# Patient Record
Sex: Male | Born: 1945 | Race: White | Hispanic: No | Marital: Married | Smoking: Former smoker
Health system: Southern US, Community
[De-identification: ages and names within clinical notes are randomized; demographics above are authoritative.]

## PROBLEM LIST (undated history)

## (undated) DIAGNOSIS — G4730 Sleep apnea, unspecified: Secondary | ICD-10-CM

## (undated) DIAGNOSIS — R0609 Other forms of dyspnea: Secondary | ICD-10-CM

## (undated) DIAGNOSIS — G4733 Obstructive sleep apnea (adult) (pediatric): Secondary | ICD-10-CM

## (undated) DIAGNOSIS — R7401 Elevation of levels of liver transaminase levels: Secondary | ICD-10-CM

## (undated) DIAGNOSIS — M1990 Unspecified osteoarthritis, unspecified site: Secondary | ICD-10-CM

## (undated) DIAGNOSIS — J189 Pneumonia, unspecified organism: Secondary | ICD-10-CM

## (undated) DIAGNOSIS — K219 Gastro-esophageal reflux disease without esophagitis: Secondary | ICD-10-CM

## (undated) DIAGNOSIS — E669 Obesity, unspecified: Secondary | ICD-10-CM

## (undated) DIAGNOSIS — I2510 Atherosclerotic heart disease of native coronary artery without angina pectoris: Secondary | ICD-10-CM

## (undated) DIAGNOSIS — E119 Type 2 diabetes mellitus without complications: Secondary | ICD-10-CM

## (undated) DIAGNOSIS — K5120 Ulcerative (chronic) proctitis without complications: Secondary | ICD-10-CM

## (undated) DIAGNOSIS — I82409 Acute embolism and thrombosis of unspecified deep veins of unspecified lower extremity: Secondary | ICD-10-CM

## (undated) DIAGNOSIS — Z8719 Personal history of other diseases of the digestive system: Secondary | ICD-10-CM

## (undated) DIAGNOSIS — T4145XA Adverse effect of unspecified anesthetic, initial encounter: Secondary | ICD-10-CM

## (undated) DIAGNOSIS — M4800 Spinal stenosis, site unspecified: Secondary | ICD-10-CM

## (undated) DIAGNOSIS — T8859XA Other complications of anesthesia, initial encounter: Secondary | ICD-10-CM

## (undated) DIAGNOSIS — R740 Nonspecific elevation of levels of transaminase and lactic acid dehydrogenase [LDH]: Secondary | ICD-10-CM

## (undated) DIAGNOSIS — D539 Nutritional anemia, unspecified: Secondary | ICD-10-CM

## (undated) DIAGNOSIS — K5790 Diverticulosis of intestine, part unspecified, without perforation or abscess without bleeding: Secondary | ICD-10-CM

## (undated) DIAGNOSIS — J449 Chronic obstructive pulmonary disease, unspecified: Secondary | ICD-10-CM

## (undated) DIAGNOSIS — Z9289 Personal history of other medical treatment: Secondary | ICD-10-CM

## (undated) DIAGNOSIS — I10 Essential (primary) hypertension: Secondary | ICD-10-CM

## (undated) DIAGNOSIS — I480 Paroxysmal atrial fibrillation: Secondary | ICD-10-CM

## (undated) DIAGNOSIS — R0600 Dyspnea, unspecified: Secondary | ICD-10-CM

## (undated) DIAGNOSIS — N400 Enlarged prostate without lower urinary tract symptoms: Secondary | ICD-10-CM

## (undated) DIAGNOSIS — I2699 Other pulmonary embolism without acute cor pulmonale: Secondary | ICD-10-CM

## (undated) DIAGNOSIS — Z8739 Personal history of other diseases of the musculoskeletal system and connective tissue: Secondary | ICD-10-CM

## (undated) HISTORY — DX: Essential (primary) hypertension: I10

## (undated) HISTORY — DX: Diverticulosis of intestine, part unspecified, without perforation or abscess without bleeding: K57.90

## (undated) HISTORY — DX: Benign prostatic hyperplasia without lower urinary tract symptoms: N40.0

## (undated) HISTORY — DX: Gastro-esophageal reflux disease without esophagitis: K21.9

## (undated) HISTORY — DX: Obstructive sleep apnea (adult) (pediatric): G47.33

## (undated) HISTORY — DX: Type 2 diabetes mellitus without complications: E11.9

## (undated) HISTORY — DX: Personal history of other medical treatment: Z92.89

## (undated) HISTORY — DX: Chronic obstructive pulmonary disease, unspecified: J44.9

## (undated) HISTORY — DX: Spinal stenosis, site unspecified: M48.00

## (undated) HISTORY — DX: Ulcerative (chronic) proctitis without complications: K51.20

## (undated) HISTORY — PX: COLONOSCOPY: SHX174

## (undated) HISTORY — PX: VASECTOMY: SHX75

## (undated) HISTORY — PX: CATARACT EXTRACTION W/ INTRAOCULAR LENS  IMPLANT, BILATERAL: SHX1307

## (undated) HISTORY — DX: Sleep apnea, unspecified: G47.30

## (undated) HISTORY — PX: TOTAL HIP ARTHROPLASTY: SHX124

## (undated) HISTORY — PX: JOINT REPLACEMENT: SHX530

---

## 1999-08-28 ENCOUNTER — Ambulatory Visit (HOSPITAL_COMMUNITY): Admission: RE | Admit: 1999-08-28 | Discharge: 1999-08-28 | Payer: Self-pay | Admitting: *Deleted

## 1999-08-28 ENCOUNTER — Encounter (INDEPENDENT_AMBULATORY_CARE_PROVIDER_SITE_OTHER): Payer: Self-pay | Admitting: Specialist

## 2000-07-20 ENCOUNTER — Encounter: Payer: Self-pay | Admitting: Family Medicine

## 2000-07-20 ENCOUNTER — Inpatient Hospital Stay (HOSPITAL_COMMUNITY): Admission: EM | Admit: 2000-07-20 | Discharge: 2000-07-21 | Payer: Self-pay | Admitting: Emergency Medicine

## 2001-08-03 ENCOUNTER — Encounter: Admission: RE | Admit: 2001-08-03 | Discharge: 2001-08-08 | Payer: Self-pay | Admitting: Internal Medicine

## 2001-08-12 ENCOUNTER — Encounter: Payer: Self-pay | Admitting: Internal Medicine

## 2001-08-12 ENCOUNTER — Encounter: Admission: RE | Admit: 2001-08-12 | Discharge: 2001-08-12 | Payer: Self-pay | Admitting: Internal Medicine

## 2002-03-09 HISTORY — PX: COLONOSCOPY W/ POLYPECTOMY: SHX1380

## 2002-05-31 ENCOUNTER — Observation Stay (HOSPITAL_COMMUNITY): Admission: EM | Admit: 2002-05-31 | Discharge: 2002-06-01 | Payer: Self-pay | Admitting: Emergency Medicine

## 2002-05-31 ENCOUNTER — Encounter: Payer: Self-pay | Admitting: Emergency Medicine

## 2002-06-01 ENCOUNTER — Encounter: Payer: Self-pay | Admitting: Internal Medicine

## 2004-03-28 ENCOUNTER — Ambulatory Visit: Payer: Self-pay | Admitting: Internal Medicine

## 2004-04-07 ENCOUNTER — Ambulatory Visit: Payer: Self-pay | Admitting: Internal Medicine

## 2004-04-12 ENCOUNTER — Emergency Department (HOSPITAL_COMMUNITY): Admission: EM | Admit: 2004-04-12 | Discharge: 2004-04-12 | Payer: Self-pay | Admitting: Emergency Medicine

## 2004-04-14 ENCOUNTER — Ambulatory Visit: Payer: Self-pay | Admitting: Internal Medicine

## 2004-04-16 ENCOUNTER — Encounter: Admission: RE | Admit: 2004-04-16 | Discharge: 2004-04-16 | Payer: Self-pay | Admitting: Internal Medicine

## 2005-01-20 ENCOUNTER — Ambulatory Visit: Payer: Self-pay | Admitting: Gastroenterology

## 2005-01-21 ENCOUNTER — Ambulatory Visit: Payer: Self-pay | Admitting: Gastroenterology

## 2005-01-23 ENCOUNTER — Encounter (INDEPENDENT_AMBULATORY_CARE_PROVIDER_SITE_OTHER): Payer: Self-pay | Admitting: Specialist

## 2005-01-23 ENCOUNTER — Ambulatory Visit: Payer: Self-pay | Admitting: Gastroenterology

## 2005-01-23 ENCOUNTER — Ambulatory Visit (HOSPITAL_COMMUNITY): Admission: RE | Admit: 2005-01-23 | Discharge: 2005-01-23 | Payer: Self-pay | Admitting: Gastroenterology

## 2005-02-18 ENCOUNTER — Ambulatory Visit: Payer: Self-pay | Admitting: Internal Medicine

## 2005-03-04 ENCOUNTER — Ambulatory Visit: Payer: Self-pay

## 2005-03-11 ENCOUNTER — Ambulatory Visit: Payer: Self-pay | Admitting: Gastroenterology

## 2005-05-26 ENCOUNTER — Ambulatory Visit: Payer: Self-pay | Admitting: Internal Medicine

## 2006-03-18 ENCOUNTER — Ambulatory Visit: Payer: Self-pay | Admitting: Internal Medicine

## 2006-03-18 LAB — CONVERTED CEMR LAB
ALT: 32 units/L (ref 0–40)
AST: 30 units/L (ref 0–37)
Albumin: 3.8 g/dL (ref 3.5–5.2)
Basophils Absolute: 0 10*3/uL (ref 0.0–0.1)
Bilirubin, Direct: 0.1 mg/dL (ref 0.0–0.3)
Eosinophil percent: 1.7 % (ref 0.0–5.0)
Lymphocytes Relative: 33.1 % (ref 12.0–46.0)
MCV: 94.2 fL (ref 78.0–100.0)
Monocytes Relative: 10.6 % (ref 3.0–11.0)
Neutro Abs: 3.7 10*3/uL (ref 1.4–7.7)
Neutrophils Relative %: 54.1 % (ref 43.0–77.0)
Platelets: 364 10*3/uL (ref 150–400)
Rheumatoid Fact: <20 intl units/mL — ABNORMAL LOW (ref 0.0–20.0)
WBC: 6.7 10*3/uL (ref 4.5–10.5)

## 2006-03-22 ENCOUNTER — Ambulatory Visit: Payer: Self-pay | Admitting: Internal Medicine

## 2006-04-01 DIAGNOSIS — M109 Gout, unspecified: Secondary | ICD-10-CM

## 2006-04-01 DIAGNOSIS — K219 Gastro-esophageal reflux disease without esophagitis: Secondary | ICD-10-CM | POA: Insufficient documentation

## 2006-04-01 DIAGNOSIS — D126 Benign neoplasm of colon, unspecified: Secondary | ICD-10-CM

## 2006-04-01 DIAGNOSIS — I10 Essential (primary) hypertension: Secondary | ICD-10-CM

## 2006-06-28 ENCOUNTER — Ambulatory Visit: Payer: Self-pay | Admitting: Gastroenterology

## 2006-07-16 ENCOUNTER — Ambulatory Visit: Payer: Self-pay | Admitting: Gastroenterology

## 2006-07-16 ENCOUNTER — Encounter: Payer: Self-pay | Admitting: Family Medicine

## 2006-07-16 ENCOUNTER — Encounter: Payer: Self-pay | Admitting: Gastroenterology

## 2006-08-23 ENCOUNTER — Encounter: Payer: Self-pay | Admitting: Family Medicine

## 2007-02-21 ENCOUNTER — Telehealth (INDEPENDENT_AMBULATORY_CARE_PROVIDER_SITE_OTHER): Payer: Self-pay | Admitting: *Deleted

## 2007-03-09 ENCOUNTER — Telehealth (INDEPENDENT_AMBULATORY_CARE_PROVIDER_SITE_OTHER): Payer: Self-pay | Admitting: *Deleted

## 2007-04-25 ENCOUNTER — Emergency Department (HOSPITAL_COMMUNITY): Admission: EM | Admit: 2007-04-25 | Discharge: 2007-04-25 | Payer: Self-pay | Admitting: Family Medicine

## 2007-04-25 ENCOUNTER — Telehealth (INDEPENDENT_AMBULATORY_CARE_PROVIDER_SITE_OTHER): Payer: Self-pay | Admitting: *Deleted

## 2007-05-12 ENCOUNTER — Ambulatory Visit: Payer: Self-pay | Admitting: Internal Medicine

## 2007-05-12 DIAGNOSIS — R350 Frequency of micturition: Secondary | ICD-10-CM

## 2007-05-12 DIAGNOSIS — R351 Nocturia: Secondary | ICD-10-CM | POA: Insufficient documentation

## 2007-05-12 LAB — CONVERTED CEMR LAB
Bilirubin Urine: NEGATIVE
Blood in Urine, dipstick: NEGATIVE
Ketones, urine, test strip: NEGATIVE
Urobilinogen, UA: NEGATIVE
pH: 5

## 2007-05-14 LAB — CONVERTED CEMR LAB: PSA: 3.51 ng/mL (ref 0.10–4.00)

## 2007-05-16 ENCOUNTER — Encounter (INDEPENDENT_AMBULATORY_CARE_PROVIDER_SITE_OTHER): Payer: Self-pay | Admitting: *Deleted

## 2007-06-25 ENCOUNTER — Inpatient Hospital Stay (HOSPITAL_COMMUNITY): Admission: EM | Admit: 2007-06-25 | Discharge: 2007-06-26 | Payer: Self-pay | Admitting: Emergency Medicine

## 2007-06-25 ENCOUNTER — Telehealth (INDEPENDENT_AMBULATORY_CARE_PROVIDER_SITE_OTHER): Payer: Self-pay | Admitting: *Deleted

## 2007-06-25 ENCOUNTER — Encounter: Payer: Self-pay | Admitting: Internal Medicine

## 2007-06-25 ENCOUNTER — Ambulatory Visit: Payer: Self-pay | Admitting: Internal Medicine

## 2007-06-25 DIAGNOSIS — I480 Paroxysmal atrial fibrillation: Secondary | ICD-10-CM | POA: Insufficient documentation

## 2007-06-26 ENCOUNTER — Encounter: Payer: Self-pay | Admitting: Internal Medicine

## 2007-06-28 ENCOUNTER — Ambulatory Visit: Payer: Self-pay | Admitting: Internal Medicine

## 2007-06-28 DIAGNOSIS — N401 Enlarged prostate with lower urinary tract symptoms: Secondary | ICD-10-CM

## 2007-06-28 DIAGNOSIS — N138 Other obstructive and reflux uropathy: Secondary | ICD-10-CM

## 2007-06-29 ENCOUNTER — Encounter: Payer: Self-pay | Admitting: Internal Medicine

## 2007-06-29 ENCOUNTER — Ambulatory Visit: Payer: Self-pay | Admitting: Cardiology

## 2007-06-29 ENCOUNTER — Ambulatory Visit: Payer: Self-pay

## 2007-06-30 ENCOUNTER — Encounter (INDEPENDENT_AMBULATORY_CARE_PROVIDER_SITE_OTHER): Payer: Self-pay | Admitting: *Deleted

## 2007-07-01 ENCOUNTER — Encounter: Payer: Self-pay | Admitting: Internal Medicine

## 2007-07-04 ENCOUNTER — Ambulatory Visit: Payer: Self-pay | Admitting: Internal Medicine

## 2007-07-04 LAB — CONVERTED CEMR LAB: Prothrombin Time: 20.1 s

## 2007-07-05 ENCOUNTER — Ambulatory Visit: Payer: Self-pay | Admitting: Cardiology

## 2007-07-05 ENCOUNTER — Ambulatory Visit: Payer: Self-pay

## 2007-07-18 ENCOUNTER — Ambulatory Visit: Payer: Self-pay | Admitting: Internal Medicine

## 2007-07-18 ENCOUNTER — Telehealth (INDEPENDENT_AMBULATORY_CARE_PROVIDER_SITE_OTHER): Payer: Self-pay | Admitting: *Deleted

## 2007-07-18 LAB — CONVERTED CEMR LAB: INR: 5.7 (ref 0.8–1.0)

## 2007-07-20 ENCOUNTER — Ambulatory Visit: Payer: Self-pay | Admitting: Cardiology

## 2007-07-20 ENCOUNTER — Telehealth: Payer: Self-pay | Admitting: Internal Medicine

## 2007-07-20 ENCOUNTER — Emergency Department (HOSPITAL_COMMUNITY): Admission: EM | Admit: 2007-07-20 | Discharge: 2007-07-20 | Payer: Self-pay | Admitting: Emergency Medicine

## 2007-07-20 ENCOUNTER — Ambulatory Visit: Payer: Self-pay | Admitting: Internal Medicine

## 2007-07-20 LAB — CONVERTED CEMR LAB
ALT: 19 units/L (ref 0–53)
AST: 21 units/L (ref 0–37)
Albumin: 3.8 g/dL (ref 3.5–5.2)
Alkaline Phosphatase: 64 units/L (ref 39–117)
Bilirubin, Direct: 0.1 mg/dL (ref 0.0–0.3)
INR: 3.8
Total Bilirubin: 1 mg/dL (ref 0.3–1.2)
VLDL: 25 mg/dL (ref 0–40)

## 2007-07-22 ENCOUNTER — Telehealth: Payer: Self-pay | Admitting: Internal Medicine

## 2007-07-25 ENCOUNTER — Ambulatory Visit: Payer: Self-pay | Admitting: Cardiology

## 2007-07-25 ENCOUNTER — Ambulatory Visit: Payer: Self-pay

## 2007-07-25 ENCOUNTER — Encounter: Payer: Self-pay | Admitting: Internal Medicine

## 2007-08-02 ENCOUNTER — Telehealth (INDEPENDENT_AMBULATORY_CARE_PROVIDER_SITE_OTHER): Payer: Self-pay | Admitting: *Deleted

## 2007-08-08 ENCOUNTER — Encounter: Payer: Self-pay | Admitting: Internal Medicine

## 2007-09-08 ENCOUNTER — Ambulatory Visit: Payer: Self-pay | Admitting: Internal Medicine

## 2007-09-08 DIAGNOSIS — R7989 Other specified abnormal findings of blood chemistry: Secondary | ICD-10-CM | POA: Insufficient documentation

## 2007-09-08 DIAGNOSIS — E785 Hyperlipidemia, unspecified: Secondary | ICD-10-CM

## 2007-09-08 LAB — CONVERTED CEMR LAB
Cholesterol, target level: 200 mg/dL
HDL goal, serum: 40 mg/dL

## 2007-09-12 ENCOUNTER — Encounter: Payer: Self-pay | Admitting: Internal Medicine

## 2007-09-20 ENCOUNTER — Telehealth (INDEPENDENT_AMBULATORY_CARE_PROVIDER_SITE_OTHER): Payer: Self-pay | Admitting: *Deleted

## 2007-09-20 LAB — CONVERTED CEMR LAB
Microalb, Ur: 0.2 mg/dL (ref 0.0–1.9)
Uric Acid, Serum: 8.3 mg/dL — ABNORMAL HIGH (ref 4.0–7.8)

## 2007-10-25 ENCOUNTER — Telehealth (INDEPENDENT_AMBULATORY_CARE_PROVIDER_SITE_OTHER): Payer: Self-pay | Admitting: *Deleted

## 2007-10-31 ENCOUNTER — Ambulatory Visit: Payer: Self-pay | Admitting: Internal Medicine

## 2007-10-31 DIAGNOSIS — M549 Dorsalgia, unspecified: Secondary | ICD-10-CM | POA: Insufficient documentation

## 2007-11-02 LAB — CONVERTED CEMR LAB
BUN: 10 mg/dL (ref 6–23)
Creatinine, Ser: 1.1 mg/dL (ref 0.4–1.5)
GFR calc Af Amer: 87 mL/min
Glucose, Bld: 80 mg/dL (ref 70–99)

## 2008-02-07 HISTORY — PX: LUMBAR FUSION: SHX111

## 2008-02-20 ENCOUNTER — Ambulatory Visit: Payer: Self-pay | Admitting: Family Medicine

## 2008-02-20 DIAGNOSIS — J45909 Unspecified asthma, uncomplicated: Secondary | ICD-10-CM | POA: Insufficient documentation

## 2008-03-09 HISTORY — PX: ANTERIOR LUMBAR DISC ARTHROPLASTY: SHX5721

## 2008-03-15 ENCOUNTER — Emergency Department (HOSPITAL_COMMUNITY): Admission: EM | Admit: 2008-03-15 | Discharge: 2008-03-16 | Payer: Self-pay | Admitting: Emergency Medicine

## 2008-03-15 ENCOUNTER — Ambulatory Visit: Payer: Self-pay | Admitting: Vascular Surgery

## 2008-03-15 ENCOUNTER — Encounter (INDEPENDENT_AMBULATORY_CARE_PROVIDER_SITE_OTHER): Payer: Self-pay | Admitting: Emergency Medicine

## 2008-03-16 ENCOUNTER — Encounter: Payer: Self-pay | Admitting: Cardiology

## 2008-03-18 ENCOUNTER — Encounter: Payer: Self-pay | Admitting: Cardiology

## 2008-03-26 ENCOUNTER — Encounter: Payer: Self-pay | Admitting: Cardiology

## 2008-04-03 ENCOUNTER — Ambulatory Visit: Payer: Self-pay | Admitting: Cardiology

## 2008-04-05 ENCOUNTER — Encounter: Payer: Self-pay | Admitting: Internal Medicine

## 2008-04-06 ENCOUNTER — Telehealth (INDEPENDENT_AMBULATORY_CARE_PROVIDER_SITE_OTHER): Payer: Self-pay | Admitting: *Deleted

## 2008-04-06 ENCOUNTER — Ambulatory Visit: Payer: Self-pay | Admitting: Cardiovascular Disease

## 2008-04-17 ENCOUNTER — Telehealth (INDEPENDENT_AMBULATORY_CARE_PROVIDER_SITE_OTHER): Payer: Self-pay | Admitting: *Deleted

## 2008-04-20 ENCOUNTER — Encounter: Payer: Self-pay | Admitting: Internal Medicine

## 2008-04-20 ENCOUNTER — Telehealth (INDEPENDENT_AMBULATORY_CARE_PROVIDER_SITE_OTHER): Payer: Self-pay | Admitting: *Deleted

## 2008-05-07 ENCOUNTER — Ambulatory Visit: Payer: Self-pay | Admitting: Internal Medicine

## 2008-05-07 ENCOUNTER — Telehealth (INDEPENDENT_AMBULATORY_CARE_PROVIDER_SITE_OTHER): Payer: Self-pay | Admitting: *Deleted

## 2008-05-07 DIAGNOSIS — R609 Edema, unspecified: Secondary | ICD-10-CM

## 2008-05-07 DIAGNOSIS — M79609 Pain in unspecified limb: Secondary | ICD-10-CM | POA: Insufficient documentation

## 2008-05-08 LAB — CONVERTED CEMR LAB
Creatinine, Ser: 0.9 mg/dL (ref 0.4–1.5)
Potassium: 4.3 meq/L (ref 3.5–5.1)
Uric Acid, Serum: 7 mg/dL (ref 4.0–7.8)

## 2008-05-09 ENCOUNTER — Encounter (INDEPENDENT_AMBULATORY_CARE_PROVIDER_SITE_OTHER): Payer: Self-pay | Admitting: *Deleted

## 2008-05-18 ENCOUNTER — Ambulatory Visit: Payer: Self-pay | Admitting: Cardiovascular Disease

## 2008-05-25 ENCOUNTER — Ambulatory Visit: Payer: Self-pay | Admitting: Internal Medicine

## 2008-05-25 DIAGNOSIS — R7309 Other abnormal glucose: Secondary | ICD-10-CM

## 2008-05-25 LAB — CONVERTED CEMR LAB: INR: 1.7

## 2008-05-28 ENCOUNTER — Encounter (INDEPENDENT_AMBULATORY_CARE_PROVIDER_SITE_OTHER): Payer: Self-pay | Admitting: *Deleted

## 2008-05-28 LAB — CONVERTED CEMR LAB: Hgb A1c MFr Bld: 5.1 % (ref 4.6–6.5)

## 2008-06-06 ENCOUNTER — Ambulatory Visit: Payer: Self-pay | Admitting: Family Medicine

## 2008-06-07 ENCOUNTER — Ambulatory Visit: Payer: Self-pay | Admitting: Cardiovascular Disease

## 2008-06-27 ENCOUNTER — Telehealth: Payer: Self-pay | Admitting: Internal Medicine

## 2008-06-28 ENCOUNTER — Ambulatory Visit: Payer: Self-pay | Admitting: Internal Medicine

## 2008-07-02 ENCOUNTER — Ambulatory Visit: Payer: Self-pay | Admitting: Internal Medicine

## 2008-07-06 ENCOUNTER — Ambulatory Visit: Payer: Self-pay | Admitting: Internal Medicine

## 2008-07-10 ENCOUNTER — Telehealth (INDEPENDENT_AMBULATORY_CARE_PROVIDER_SITE_OTHER): Payer: Self-pay | Admitting: *Deleted

## 2008-07-17 ENCOUNTER — Telehealth (INDEPENDENT_AMBULATORY_CARE_PROVIDER_SITE_OTHER): Payer: Self-pay | Admitting: *Deleted

## 2008-07-18 ENCOUNTER — Telehealth (INDEPENDENT_AMBULATORY_CARE_PROVIDER_SITE_OTHER): Payer: Self-pay | Admitting: *Deleted

## 2008-07-20 ENCOUNTER — Telehealth: Payer: Self-pay | Admitting: Internal Medicine

## 2008-08-07 ENCOUNTER — Encounter: Payer: Self-pay | Admitting: *Deleted

## 2008-08-20 ENCOUNTER — Encounter: Payer: Self-pay | Admitting: Internal Medicine

## 2008-08-21 ENCOUNTER — Encounter: Payer: Self-pay | Admitting: Cardiology

## 2008-09-03 ENCOUNTER — Encounter: Payer: Self-pay | Admitting: Cardiology

## 2008-09-11 ENCOUNTER — Encounter: Payer: Self-pay | Admitting: Cardiology

## 2008-09-12 ENCOUNTER — Encounter: Payer: Self-pay | Admitting: *Deleted

## 2008-09-13 ENCOUNTER — Encounter: Payer: Self-pay | Admitting: Cardiology

## 2008-09-17 ENCOUNTER — Encounter: Payer: Self-pay | Admitting: Internal Medicine

## 2008-09-17 ENCOUNTER — Encounter: Payer: Self-pay | Admitting: Cardiology

## 2008-09-19 ENCOUNTER — Ambulatory Visit: Payer: Self-pay | Admitting: Internal Medicine

## 2008-09-19 LAB — CONVERTED CEMR LAB: POC INR: 1.5

## 2008-09-20 ENCOUNTER — Encounter: Payer: Self-pay | Admitting: Cardiology

## 2008-09-20 ENCOUNTER — Encounter: Payer: Self-pay | Admitting: Internal Medicine

## 2008-10-02 ENCOUNTER — Telehealth (INDEPENDENT_AMBULATORY_CARE_PROVIDER_SITE_OTHER): Payer: Self-pay | Admitting: *Deleted

## 2008-10-03 ENCOUNTER — Ambulatory Visit: Payer: Self-pay | Admitting: Cardiology

## 2008-10-03 LAB — CONVERTED CEMR LAB
POC INR: 3
Prothrombin Time: 20.8 s

## 2008-10-04 ENCOUNTER — Telehealth: Payer: Self-pay | Admitting: Internal Medicine

## 2008-10-05 ENCOUNTER — Ambulatory Visit: Payer: Self-pay | Admitting: Internal Medicine

## 2008-10-05 DIAGNOSIS — Z86718 Personal history of other venous thrombosis and embolism: Secondary | ICD-10-CM

## 2008-10-05 DIAGNOSIS — Z8709 Personal history of other diseases of the respiratory system: Secondary | ICD-10-CM | POA: Insufficient documentation

## 2008-10-05 DIAGNOSIS — I421 Obstructive hypertrophic cardiomyopathy: Secondary | ICD-10-CM | POA: Insufficient documentation

## 2008-10-07 DIAGNOSIS — Z8719 Personal history of other diseases of the digestive system: Secondary | ICD-10-CM

## 2008-10-07 DIAGNOSIS — K5730 Diverticulosis of large intestine without perforation or abscess without bleeding: Secondary | ICD-10-CM | POA: Insufficient documentation

## 2008-10-15 ENCOUNTER — Encounter: Payer: Self-pay | Admitting: Internal Medicine

## 2008-10-23 ENCOUNTER — Encounter: Payer: Self-pay | Admitting: Internal Medicine

## 2008-10-23 ENCOUNTER — Encounter: Payer: Self-pay | Admitting: Cardiology

## 2008-10-24 ENCOUNTER — Ambulatory Visit: Payer: Self-pay | Admitting: Cardiology

## 2008-10-24 LAB — CONVERTED CEMR LAB: POC INR: 2.6

## 2008-10-25 ENCOUNTER — Ambulatory Visit: Payer: Self-pay | Admitting: Internal Medicine

## 2008-10-25 DIAGNOSIS — J984 Other disorders of lung: Secondary | ICD-10-CM | POA: Insufficient documentation

## 2008-10-29 ENCOUNTER — Ambulatory Visit: Payer: Self-pay | Admitting: Internal Medicine

## 2008-11-01 ENCOUNTER — Ambulatory Visit: Payer: Self-pay | Admitting: Internal Medicine

## 2008-11-01 DIAGNOSIS — J309 Allergic rhinitis, unspecified: Secondary | ICD-10-CM | POA: Insufficient documentation

## 2008-11-02 ENCOUNTER — Encounter (INDEPENDENT_AMBULATORY_CARE_PROVIDER_SITE_OTHER): Payer: Self-pay | Admitting: *Deleted

## 2008-11-21 ENCOUNTER — Ambulatory Visit: Payer: Self-pay | Admitting: Internal Medicine

## 2008-12-06 ENCOUNTER — Encounter: Payer: Self-pay | Admitting: Cardiology

## 2008-12-17 ENCOUNTER — Telehealth (INDEPENDENT_AMBULATORY_CARE_PROVIDER_SITE_OTHER): Payer: Self-pay | Admitting: *Deleted

## 2008-12-20 ENCOUNTER — Encounter: Payer: Self-pay | Admitting: Internal Medicine

## 2008-12-24 ENCOUNTER — Ambulatory Visit: Payer: Self-pay | Admitting: Family Medicine

## 2009-01-22 ENCOUNTER — Encounter (INDEPENDENT_AMBULATORY_CARE_PROVIDER_SITE_OTHER): Payer: Self-pay | Admitting: Pharmacist

## 2009-01-23 ENCOUNTER — Ambulatory Visit: Payer: Self-pay | Admitting: Cardiovascular Disease

## 2009-02-19 ENCOUNTER — Telehealth (INDEPENDENT_AMBULATORY_CARE_PROVIDER_SITE_OTHER): Payer: Self-pay | Admitting: *Deleted

## 2009-02-20 ENCOUNTER — Ambulatory Visit: Payer: Self-pay | Admitting: Cardiology

## 2009-02-26 ENCOUNTER — Encounter: Payer: Self-pay | Admitting: Internal Medicine

## 2009-03-21 ENCOUNTER — Ambulatory Visit: Payer: Self-pay | Admitting: Internal Medicine

## 2009-03-26 ENCOUNTER — Telehealth (INDEPENDENT_AMBULATORY_CARE_PROVIDER_SITE_OTHER): Payer: Self-pay | Admitting: *Deleted

## 2009-03-28 ENCOUNTER — Ambulatory Visit: Payer: Self-pay | Admitting: Cardiology

## 2009-04-02 ENCOUNTER — Encounter: Payer: Self-pay | Admitting: Cardiology

## 2009-04-15 ENCOUNTER — Telehealth: Payer: Self-pay | Admitting: Cardiology

## 2009-04-15 ENCOUNTER — Encounter (INDEPENDENT_AMBULATORY_CARE_PROVIDER_SITE_OTHER): Payer: Self-pay | Admitting: Cardiology

## 2009-04-16 ENCOUNTER — Encounter: Payer: Self-pay | Admitting: Cardiology

## 2009-05-02 ENCOUNTER — Telehealth: Payer: Self-pay | Admitting: Cardiology

## 2009-05-02 ENCOUNTER — Encounter: Payer: Self-pay | Admitting: Cardiology

## 2009-05-06 ENCOUNTER — Encounter (INDEPENDENT_AMBULATORY_CARE_PROVIDER_SITE_OTHER): Payer: Self-pay | Admitting: Cardiology

## 2009-05-06 ENCOUNTER — Encounter: Payer: Self-pay | Admitting: Cardiovascular Disease

## 2009-05-06 LAB — CONVERTED CEMR LAB
POC INR: 1.9
Prothrombin Time: 19 s

## 2009-05-21 ENCOUNTER — Ambulatory Visit: Payer: Self-pay | Admitting: Internal Medicine

## 2009-05-21 DIAGNOSIS — M8700 Idiopathic aseptic necrosis of unspecified bone: Secondary | ICD-10-CM | POA: Insufficient documentation

## 2009-05-21 DIAGNOSIS — M1990 Unspecified osteoarthritis, unspecified site: Secondary | ICD-10-CM | POA: Insufficient documentation

## 2009-06-05 ENCOUNTER — Telehealth (INDEPENDENT_AMBULATORY_CARE_PROVIDER_SITE_OTHER): Payer: Self-pay | Admitting: *Deleted

## 2009-06-05 ENCOUNTER — Ambulatory Visit: Payer: Self-pay | Admitting: Cardiovascular Disease

## 2009-06-05 ENCOUNTER — Encounter: Payer: Self-pay | Admitting: Physician Assistant

## 2009-06-17 ENCOUNTER — Ambulatory Visit: Payer: Self-pay | Admitting: Cardiovascular Disease

## 2009-06-20 ENCOUNTER — Encounter (INDEPENDENT_AMBULATORY_CARE_PROVIDER_SITE_OTHER): Payer: Self-pay | Admitting: Orthopedic Surgery

## 2009-06-20 ENCOUNTER — Ambulatory Visit: Payer: Self-pay | Admitting: Surgery

## 2009-06-20 ENCOUNTER — Ambulatory Visit (HOSPITAL_COMMUNITY)
Admission: RE | Admit: 2009-06-20 | Discharge: 2009-06-20 | Payer: Self-pay | Source: Home / Self Care | Admitting: Orthopedic Surgery

## 2009-06-21 ENCOUNTER — Encounter: Admission: RE | Admit: 2009-06-21 | Discharge: 2009-06-21 | Payer: Self-pay | Admitting: Orthopedic Surgery

## 2009-07-04 ENCOUNTER — Ambulatory Visit: Payer: Self-pay | Admitting: Cardiology

## 2009-07-05 ENCOUNTER — Encounter: Payer: Self-pay | Admitting: Internal Medicine

## 2009-07-08 ENCOUNTER — Telehealth (INDEPENDENT_AMBULATORY_CARE_PROVIDER_SITE_OTHER): Payer: Self-pay | Admitting: *Deleted

## 2009-07-11 ENCOUNTER — Telehealth: Payer: Self-pay | Admitting: Cardiology

## 2009-07-24 ENCOUNTER — Telehealth: Payer: Self-pay | Admitting: Internal Medicine

## 2009-07-24 ENCOUNTER — Ambulatory Visit: Payer: Self-pay | Admitting: Internal Medicine

## 2009-07-24 DIAGNOSIS — M5412 Radiculopathy, cervical region: Secondary | ICD-10-CM | POA: Insufficient documentation

## 2009-07-24 DIAGNOSIS — Z9189 Other specified personal risk factors, not elsewhere classified: Secondary | ICD-10-CM

## 2009-07-24 LAB — CONVERTED CEMR LAB: INR: 1.9

## 2009-07-25 ENCOUNTER — Telehealth: Payer: Self-pay | Admitting: Internal Medicine

## 2009-07-30 ENCOUNTER — Telehealth (INDEPENDENT_AMBULATORY_CARE_PROVIDER_SITE_OTHER): Payer: Self-pay | Admitting: Pharmacist

## 2009-08-06 ENCOUNTER — Inpatient Hospital Stay (HOSPITAL_COMMUNITY): Admission: RE | Admit: 2009-08-06 | Discharge: 2009-08-09 | Payer: Self-pay | Admitting: Orthopedic Surgery

## 2009-08-21 ENCOUNTER — Encounter: Payer: Self-pay | Admitting: Internal Medicine

## 2009-09-02 ENCOUNTER — Encounter: Payer: Self-pay | Admitting: Cardiology

## 2009-09-02 LAB — CONVERTED CEMR LAB: Prothrombin Time: 21.2 s

## 2009-09-12 ENCOUNTER — Ambulatory Visit: Payer: Self-pay | Admitting: Internal Medicine

## 2009-09-12 DIAGNOSIS — G4700 Insomnia, unspecified: Secondary | ICD-10-CM | POA: Insufficient documentation

## 2009-09-16 ENCOUNTER — Encounter: Payer: Self-pay | Admitting: Internal Medicine

## 2009-09-16 LAB — CONVERTED CEMR LAB: Prothrombin Time: 32.3 s

## 2009-10-01 ENCOUNTER — Ambulatory Visit: Payer: Self-pay | Admitting: Cardiology

## 2009-10-04 ENCOUNTER — Telehealth (INDEPENDENT_AMBULATORY_CARE_PROVIDER_SITE_OTHER): Payer: Self-pay | Admitting: *Deleted

## 2009-10-07 ENCOUNTER — Telehealth (INDEPENDENT_AMBULATORY_CARE_PROVIDER_SITE_OTHER): Payer: Self-pay | Admitting: *Deleted

## 2009-10-08 ENCOUNTER — Telehealth (INDEPENDENT_AMBULATORY_CARE_PROVIDER_SITE_OTHER): Payer: Self-pay | Admitting: *Deleted

## 2009-10-15 ENCOUNTER — Telehealth: Payer: Self-pay | Admitting: Cardiology

## 2009-10-18 ENCOUNTER — Telehealth: Payer: Self-pay | Admitting: Cardiology

## 2009-10-22 ENCOUNTER — Encounter (INDEPENDENT_AMBULATORY_CARE_PROVIDER_SITE_OTHER): Payer: Self-pay | Admitting: *Deleted

## 2009-10-29 ENCOUNTER — Ambulatory Visit: Payer: Self-pay | Admitting: Cardiovascular Disease

## 2009-11-07 ENCOUNTER — Telehealth (INDEPENDENT_AMBULATORY_CARE_PROVIDER_SITE_OTHER): Payer: Self-pay | Admitting: *Deleted

## 2009-11-13 ENCOUNTER — Telehealth: Payer: Self-pay | Admitting: Cardiology

## 2009-11-14 ENCOUNTER — Ambulatory Visit: Payer: Self-pay | Admitting: Cardiology

## 2009-11-18 ENCOUNTER — Encounter: Payer: Self-pay | Admitting: Internal Medicine

## 2009-11-26 ENCOUNTER — Ambulatory Visit: Payer: Self-pay | Admitting: Cardiology

## 2009-12-12 ENCOUNTER — Ambulatory Visit: Payer: Self-pay | Admitting: Internal Medicine

## 2009-12-12 LAB — CONVERTED CEMR LAB: POC INR: 2.3

## 2009-12-24 ENCOUNTER — Ambulatory Visit: Payer: Self-pay | Admitting: Internal Medicine

## 2009-12-30 ENCOUNTER — Telehealth: Payer: Self-pay | Admitting: Internal Medicine

## 2009-12-30 ENCOUNTER — Encounter: Payer: Self-pay | Admitting: Internal Medicine

## 2010-01-03 ENCOUNTER — Encounter: Payer: Self-pay | Admitting: Internal Medicine

## 2010-01-09 ENCOUNTER — Ambulatory Visit: Payer: Self-pay | Admitting: Cardiology

## 2010-01-09 LAB — CONVERTED CEMR LAB: POC INR: 2.3

## 2010-02-14 ENCOUNTER — Encounter: Payer: Self-pay | Admitting: Internal Medicine

## 2010-02-17 ENCOUNTER — Encounter: Payer: Self-pay | Admitting: Internal Medicine

## 2010-02-18 ENCOUNTER — Ambulatory Visit: Payer: Self-pay | Admitting: Internal Medicine

## 2010-02-18 DIAGNOSIS — J45901 Unspecified asthma with (acute) exacerbation: Secondary | ICD-10-CM | POA: Insufficient documentation

## 2010-02-19 ENCOUNTER — Ambulatory Visit: Payer: Self-pay | Admitting: Cardiology

## 2010-02-19 LAB — CONVERTED CEMR LAB: POC INR: 2.1

## 2010-03-17 ENCOUNTER — Encounter: Payer: Self-pay | Admitting: Cardiology

## 2010-03-17 ENCOUNTER — Ambulatory Visit
Admission: RE | Admit: 2010-03-17 | Discharge: 2010-03-17 | Payer: Self-pay | Source: Home / Self Care | Attending: Cardiology | Admitting: Cardiology

## 2010-03-17 ENCOUNTER — Other Ambulatory Visit: Payer: Self-pay | Admitting: Cardiology

## 2010-03-17 ENCOUNTER — Ambulatory Visit: Admission: RE | Admit: 2010-03-17 | Discharge: 2010-03-17 | Payer: Self-pay | Source: Home / Self Care

## 2010-03-17 DIAGNOSIS — R0602 Shortness of breath: Secondary | ICD-10-CM | POA: Insufficient documentation

## 2010-03-17 LAB — CONVERTED CEMR LAB: POC INR: 2.4

## 2010-03-18 LAB — BRAIN NATRIURETIC PEPTIDE: Pro B Natriuretic peptide (BNP): 43.3 pg/mL (ref 0.0–100.0)

## 2010-04-08 NOTE — Medication Information (Signed)
Summary: [facility]  Anticoagulant Therapy  Managed by: Bethena Midget, RN, BSN Referring MD: Rollene Rotunda MD Supervising MD: Clifton James MD, Cristal Deer Indication 1: Atrial fibrillation Lab Used: [facility] [employer]: [address] PT 19.0 INR POC 1.9 INR RANGE 2 - 3  Dietary changes: no    Health status changes: no    Bleeding/hemorrhagic complications: no    Recent/future hospitalizations: no    Any changes in medication regimen? no    Recent/future dental: no  Any missed doses?: no       Is patient compliant with meds? yes       Allergies: 1)  Penicillin G Potassium (Penicillin G Potassium) 2)  Lamisil Advanced (Terbinafine)  Anticoagulation Management History:      His anticoagulation is being managed by telephone today.  Positive risk factors for bleeding include an age of 66 years or older.  Negative risk factors for bleeding include no history of CVA/TIA.  The bleeding index is 'intermediate risk'.  Positive CHADS2 values include History of HTN.  Negative CHADS2 values include Age > 3 years old, History of Diabetes, and Prior Stroke/CVA/TIA.  The start date was 03/19/2008.  His last INR was 2.5.  Prothrombin time is 19.0.  Anticoagulation responsible provider: Clifton James MD, Cristal Deer.  INR POC: 1.9.    Anticoagulation Management Assessment/Plan:      The patient's current anticoagulation dose is Coumadin 3 mg tabs: Take as directed by coumadin clinic..  The target INR is 2 - 3.  The next INR is due 05/20/2009.  Anticoagulation instructions were given to patient.  Results were reviewed/authorized by Bethena Midget, RN, BSN.  He was notified by Bethena Midget, RN, BSN.         Prior Anticoagulation Instructions: LMOM in [place] # and cell #. Bethena Midget, RN, BSN  April 16, 2009 11:27 AM  Spoke with pt.  Advised to continue on same dosage of coumadin 1.5 tablets daily except 2 tablets on MWF.  Recheck in 3 weeks.  Pt is going TCB for lab order, as he will  probably be out of town still.  Current Anticoagulation Instructions: INR 1.9  Attempted to call with results.  LMOM TCB for results. Manson Luckadoo RN  May 06, 2009 2:18 PM  05/07/09- Spoke with pt.he is still in [place],  he denies any changes or compliants. Today take 6mg s then resume 4.5mg s daily except 6mg s MWF. He's aware to recheck in 2 weeks.

## 2010-04-08 NOTE — Consult Note (Signed)
Summary: [facility]  [facility]   Imported By: Marylou Mccoy 09/04/2009 09:34:19  _____________________________________________________________________  External Attachment:    Type:   Image     Comment:   External Document

## 2010-04-08 NOTE — Consult Note (Signed)
Summary: [facility]  [facility]   Imported By: Lanelle Bal 09/18/2009 10:47:53  _____________________________________________________________________  External Attachment:    Type:   Image     Comment:   External Document

## 2010-04-08 NOTE — Medication Information (Signed)
Summary: rov/cs  Anticoagulant Therapy  Managed by: Weston Brass, PharmD Referring MD: Rollene Rotunda MD PCP: Marga Melnick MD Supervising MD: Johney Frame MD, Fayrene Fearing Indication 1: Atrial fibrillation Lab Used: [facility] Site: [facility] INR POC 2.3 INR RANGE 2 - 3  Dietary changes: no    Health status changes: no    Bleeding/hemorrhagic complications: no    Recent/future hospitalizations: no    Any changes in medication regimen? no    Recent/future dental: no  Any missed doses?: no       Is patient compliant with meds? yes       Allergies: 1)  Penicillin G Potassium (Penicillin G Potassium) 2)  Lamisil Advanced (Terbinafine)  Anticoagulation Management History:      The patient is taking warfarin and comes in today for a routine follow up visit.  Positive risk factors for bleeding include an age of 62 years or older.  Negative risk factors for bleeding include no history of CVA/TIA.  The bleeding index is 'intermediate risk'.  Positive CHADS2 values include History of HTN.  Negative CHADS2 values include Age > 37 years old, History of Diabetes, and Prior Stroke/CVA/TIA.  The start date was 03/19/2008.  His last INR was 1.9.  Anticoagulation responsible provider: Judene Logue MD, Fayrene Fearing.  INR POC: 2.3.  Cuvette Lot#: [id number].  Exp: 01/2011.    Anticoagulation Management Assessment/Plan:      The patient's current anticoagulation dose is Coumadin 3 mg tabs: Take as directed by coumadin clinic..  The target INR is 2 - 3.  The next INR is due 01/09/2010.  Anticoagulation instructions were given to patient.  Results were reviewed/authorized by Weston Brass, PharmD.  He was notified by Haynes Hoehn, PharmD Candidate.         Prior Anticoagulation Instructions: INR 1.6  Today take 2 tablets. Then change dose to 2 tablets everyday except take 1 1/2 tablets on Mondays and Fridays. Re-check INR in 2 weeks.   Current Anticoagulation Instructions: INR 2.3  Continue Coumadin as  scheduled:  2 tablets every day of the week except on 1 & 1/2 tablets on Monday and Friday.  Return to clinic in 4 weeks.

## 2010-04-08 NOTE — Consult Note (Signed)
Summary: [facility]  [facility]   Imported By: Lanelle Bal 03/21/2009 09:40:08  _____________________________________________________________________  External Attachment:    Type:   Image     Comment:   External Document

## 2010-04-08 NOTE — Assessment & Plan Note (Signed)
Summary: clearance for surgery/cbs   Vital Signs:  Patient profile:   65 year old male Height:      73 inches Weight:      216.2 pounds BMI:     28.63 Temp:     98.3 degrees F oral Pulse rate:   72 / minute Resp:     14 per minute BP sitting:   122 / 68  (left arm) Cuff size:   large  Vitals Entered By: Shonna Chock (Jul 24, 2009 8:43 AM) CC: Left Hip Replacement(Surgical Clearance), Lower Extremity Joint pain, Pre-op Evaluation Comments REVIEWED MED LIST, PATIENT AGREED DOSE AND INSTRUCTION CORRECT    Primary Care Provider:  Marga Melnick MD  CC:  Left Hip Replacement(Surgical Clearance), Lower Extremity Joint pain, and Pre-op Evaluation.  History of Present Illness:  Lower Extremity Joint Pain      This is a 65 year old man who presents with Lower Extremity Joint pain since back surgery 03/2008.  The patient reports decreased ROM and weakness, but denies swelling, redness, giving away, locking, popping, and stiffness for >1 hr.  The pain is located in the left hip.  The pain began gradually post op, as noted w/o injury.  The pain is described as dull, aching, and intermittent.  Evaluation to date has included plain X-rays and MRI scan by Dr Charlann Boxer. His mobility is compromised & pain necessitates narcotic meds.Surgery planned for 08/06/2009 @ [facility]. The patient denies the following symptoms: fever, rash, eye symptoms, diarrhea, and dysuria.    .  The patient describes intermittent dull L chest pain, "like an overinflated BP cuff" . The pain is always related to position of the LUE,especially when @ computer.He has PMH of post op PTE, PAF,& Hypertrophic Cardiomyopathy. This was assessed by Dr Antoine Poche in 07/04/2009(OV reviewed).He  denies respiratory symptoms, GI bleeding, edema, PND, heavy ETOH use, and smoking.  Patient has no history of acute or recent MI, unstable or severe angina, decompensated CHF, high grade AV block, and severe valvular disease.  Conditions requiring action prior  to surgery include warfarin. Dr Bryan Lemma stopping this 5 days pre op.   Allergies: 1)  Penicillin G Potassium (Penicillin G Potassium) 2)  Lamisil Advanced (Terbinafine)  Review of Systems General:  Complains of sleep disorder; denies fatigue; His wife , Clydie Braun, questions Sleep Apnea.He denies this.[name] Resp:  Complains of excessive snoring; denies cough, hypersomnolence, morning headaches, shortness of breath, sputum productive, and wheezing. GI:  Denies bloody stools and dark tarry stools. GU:  Complains of urinary frequency; denies hematuria. Neuro:  Complains of numbness; denies brief paralysis, tingling, and weakness; Occasional numbness LUE with chest discomfort & L flank numbness since back surgery.  Physical Exam  General:  well-nourished,in no acute distress; alert,appropriate and cooperative throughout examination Mouth:  Oral mucosa and oropharynx without lesions or exudates.  Teeth in good repair.Crowded oropharynx with low lying uvula Neck:  No deformities, masses, or tenderness noted. Lungs:  Normal respiratory effort, chest expands symmetrically. Lungs are clear to auscultation, no crackles or wheezes. Heart:  Normal rate and regular rhythm. S1 and S2 normal without gallop, murmur, click, rub.S4 with slurring; minimal respiratory variation to rhythm Abdomen:  Bowel sounds positive,abdomen soft and non-tender without masses, organomegaly or hernias noted. Suprapubic op scar Pulses:  R and L carotid,radial,dorsalis pedis and posterior tibial pulses are full and equal bilaterally Extremities:  No clubbing, cyanosis, edema, or deformity noted. Pain with ROM of L hip & with L thigh elevation. Neurologic:  alert &  oriented X3, strength normal in all extremities, and DTRs symmetrical and normal except decrease L knee.   Skin:  Intact without suspicious lesions or rashes Cervical Nodes:  No lymphadenopathy noted Axillary Nodes:  No palpable lymphadenopathy Psych:  memory intact for  recent and remote, normally interactive, and good eye contact.     Impression & Recommendations:  Problem # 1:  DEGENERATIVE JOINT DISEASE, ADVANCED (ICD-715.90) L hip with chronic pain & compromised mobility; cleared for surgery His updated medication list for this problem includes:    Vicodin 5-500 Mg Tabs (Hydrocodone-acetaminophen) .[name]... As needed  Problem # 2:  ASTHMA (ICD-493.90) Quiescent  Problem # 3:  OBSTRUCTIVE CARDIOMYOPATHY (ICD-425.4) as per Dr Antoine Poche  Problem # 4:  PULMONARY EMBOLISM, HX OF (ICD-V12.51)  His updated medication list for this problem includes:    Coumadin 3 Mg Tabs (Warfarin sodium) .[name]... Take as directed by coumadin clinic.  Orders: Protime ([id number])  Problem # 5:  COUMADIN THERAPY (ICD-V58.61)  Orders: Protime ([id number])  Problem # 6:  SNORING, HX OF (ICD-V15.89) Apnea denied; close monitor in hospital for apnea  Problem # 7:  CERVICAL RADICULOPATHY, RIGHT (ICD-723.4) Positional; evaluation if persistant or progressive avoiding trigger  Complete Medication List: 1)  Omeprazole 20 Mg Cpdr (Omeprazole) .[name].. 1 by mouth qd 2)  Coumadin 3 Mg Tabs (Warfarin sodium) .... Take as directed by coumadin clinic. 3)  Sertraline Hcl 50 Mg Tabs (Sertraline hcl) .... Take 1/2 tab qd 4)  Vicodin 5-500 Mg Tabs (Hydrocodone-acetaminophen) .... As needed 5)  Diltiazem Hcl Er Beads 240 Mg Xr24h-cap (Diltiazem hcl er beads) .... Take one capsule by mouth daily  Patient Instructions: 1)  You are cleared for surgery , but close monitor for Sleep Apnea peri operatively needed   ANTICOAGULATION RECORD PREVIOUS REGIMEN & LAB RESULTS Anticoagulation Diagnosis:  Atrial fibrillation on  10/29/2008 Previous INR Goal Range:  2 - 3 on  06/28/2008 Previous INR:  2.2 on  05/21/2009 Previous Coumadin Dose(mg):  (3mg  tab)M/W/F 2 TABS ALL OTHER DAYS 1 1/2 TABS on  05/21/2009 Previous Regimen:  6 mg m.w.f.sun,4.5mg  tu th sa on  10/29/2008 Previous Coagulation Comments:   return 2 weeks on  10/29/2008  NEW REGIMEN & LAB RESULTS Current INR: 1.9 Regimen: 2 1/2 TABS TODAY AND THEN RESUME REGULAR SCHEDULE: 1 1/2 tablets every day except 2 tablets on Monday, Wednesday and Friday   Provider: HOPPER,WILLIAM MEDICATIONS OMEPRAZOLE 20 MG CPDR (OMEPRAZOLE) 1 by mouth qd COUMADIN 3 MG TABS (WARFARIN SODIUM) Take as directed by coumadin clinic. SERTRALINE HCL 50 MG TABS (SERTRALINE HCL) take 1/2 tab qd VICODIN 5-500 MG TABS (HYDROCODONE-ACETAMINOPHEN) as needed DILTIAZEM HCL ER BEADS 240 MG XR24H-CAP (DILTIAZEM HCL ER BEADS) Take one capsule by mouth daily

## 2010-04-08 NOTE — Progress Notes (Signed)
Summary: Not Sleeping  Phone Note Call from Patient Call back at Work Phone [phone]   Caller: Spouse  ~ Clydie Braun Summary of Call: Patient's wife calle and LM on triage VM stating that [name] [name] is not sleeping well at all and she was hoping for something to help him sleep. She said he gets up at night and goes and watched tv. Please advise.  Initial call taken by: Harold Barban,  October 08, 2009 11:49 AM  Follow-up for Phone Call        He has Zolpidem 10 mg which can be taken every 3 rd night as needed. If this does not help , I recommend referral to a Sleep Specialist, Dr Dohmier to rule out an underlying  sleep disorder. Follow-up by: Marga Melnick MD,  October 08, 2009 1:12 PM  Additional Follow-up for Phone Call Additional follow up Details #1::        Spoke with patient's wife and she would like a refill on the [place], she doesn't think he has any. Needs to be sent to [facility]. She will call us back in a week or two to let us know how he is doing.  Additional Follow-up by: Harold Barban,  October 08, 2009 1:22 PM    Prescriptions: ZOLPIDEM TARTRATE 10 MG TABS (ZOLPIDEM TARTRATE) 1 at bedtime prn  #15 x 0   Entered by:   Doristine Devoid CMA   Authorized by:   Marga Melnick MD   Signed by:   Doristine Devoid CMA on 10/08/2009   Method used:   Telephoned to ...       [facility]* (retail)       [address] [place], [place]  [address]       Ph: [phone] or [phone]       Fax: [phone]   RxID:   [id number]

## 2010-04-08 NOTE — Assessment & Plan Note (Signed)
Summary: cough/nta   Vital Signs:  Patient profile:   65 year old male Weight:      265.8 pounds BMI:     35.44 Temp:     97.4 degrees F oral Pulse rate:   72 / minute Resp:     16 per minute BP sitting:   110 / 78  (left arm) Cuff size:   large  Vitals Entered By: Shonna Chock (March 21, 2009 8:07 AM) CC: Cough Comments REVIEWED MED LIST, PATIENT AGREED DOSE AND INSTRUCTION CORRECT    CC:  Cough.  History of Present Illness: Onset 1 week as rhinitis, NP cough & malaise. Rx: Mucinex. Present status as per ROS. Note : PT/INR due  Allergies: 1)  Penicillin G Potassium (Penicillin G Potassium) 2)  Lamisil Advanced (Terbinafine)  Review of Systems General:  Complains of chills; denies fever and sweats. ENT:  Complains of nasal congestion, postnasal drainage, and sinus pressure; denies earache and sore throat; No facial pain, frontal headache but yellow /green discharge. Resp:  Complains of cough, excessive snoring, and sputum productive; denies chest pain with inspiration, coughing up blood, hypersomnolence, morning headaches, shortness of breath, and wheezing. Allergy:  Complains of itching eyes and sneezing; Chronic itchy eyes.  Physical Exam  General:  well-nourished,in no acute distress; alert Ears:  External ear exam shows no significant lesions or deformities.  Otoscopic examination reveals clear canals, tympanic membranes are intact bilaterally without bulging, retraction, inflammation or discharge. Hearing is grossly normal bilaterally. Nose:  External nasal examination shows no deformity or inflammation. Nasal mucosa are dry without lesions or exudates. Slight hyponasal speech Mouth:  Oral mucosa and oropharynx without lesions or exudates.  Teeth in good repair.Mild pharyngeal erythema & crowding.   Lungs:  Normal respiratory effort, chest expands symmetrically. Lungs are clear to auscultation, no crackles or wheezes. Heart:  Normal rate and regular rhythm. S1 and S2  normal without gallop, murmur, click, rub . S4 with slurring Cervical Nodes:  No lymphadenopathy noted Axillary Nodes:  No palpable lymphadenopathy   Impression & Recommendations:  Problem # 1:  SINUSITIS- ACUTE-NOS (ICD-461.9)  The following medications were removed from the medication list:    Doxycycline Hyclate 100 Mg Caps (Doxycycline hyclate) .[name]... 1 two times a day x 2 days then 1 once daily    Nasonex 50 Mcg/act Susp (Mometasone furoate) .[name]... 1 spray two times a day (toward ear) His updated medication list for this problem includes:    Smz-tmp Ds 800-160 Mg Tabs (Sulfamethoxazole-trimethoprim) .[name]... 1 two times a day with 8 oz of water  Problem # 2:  BRONCHITIS-ACUTE (ICD-466.0)  The following medications were removed from the medication list:    Spiriva Handihaler 18 Mcg Caps (Tiotropium bromide monohydrate) ..... Inhale contents of 1 capsule once daily in am    Doxycycline Hyclate 100 Mg Caps (Doxycycline hyclate) .[name]... 1 two times a day x 2 days then 1 once daily    Symbicort 160-4.5 Mcg/act Aero (Budesonide-formoterol fumarate) .[name]... 1-2 puffs two times a day (gargle after use) His updated medication list for this problem includes:    Singulair 10 Mg Tabs (Montelukast sodium) .[name]... 1 once daily    Smz-tmp Ds 800-160 Mg Tabs (Sulfamethoxazole-trimethoprim) .[name]... 1 two times a day with 8 oz of water  Problem # 3:  PULMONARY EMBOLISM, HX OF (ICD-V12.51)  His updated medication list for this problem includes:    Coumadin 3 Mg Tabs (Warfarin sodium) .[name]... Take as directed by coumadin clinic.  Orders: Protime ([id number])  Problem # 4:  COUMADIN THERAPY (ICD-V58.61)  Orders: Protime ([id number])  Problem # 5:  ATRIAL FIBRILLATION (ICD-427.31)  Resolved The following medications were removed from the medication list:    Metoprolol Tartrate 50 Mg Tabs (Metoprolol tartrate) .[name]... 1 1/2 tab in the am, 1 tab in the pm His updated medication list for this problem includes:     Coumadin 3 Mg Tabs (Warfarin sodium) .[name]... Take as directed by coumadin clinic.    Dilt-xr 180 Mg Xr24h-cap (Diltiazem hcl) .[name]... 1 by mouth once daily  Orders: Protime ([id number])  Complete Medication List: 1)  Omeprazole 20 Mg Cpdr (Omeprazole) .[name].. 1 by mouth qd 2)  Coumadin 3 Mg Tabs (Warfarin sodium) .... Take as directed by coumadin clinic. 3)  Cetirizine Hcl 10 Mg Tabs (Cetirizine hcl) .[name].. 1 at bedtime as needed for allergies 4)  Singulair 10 Mg Tabs (Montelukast sodium) .[name].. 1 once daily 5)  Sertraline Hcl 50 Mg Tabs (Sertraline hcl) .... Take 1/2 tab qd 6)  Dilt-xr 180 Mg Xr24h-cap (Diltiazem hcl) .[name].. 1 by mouth once daily 7)  Vicodin 5-500 Mg Tabs (Hydrocodone-acetaminophen) .... As needed 8)  Smz-tmp Ds 800-160 Mg Tabs (Sulfamethoxazole-trimethoprim) .[name].. 1 two times a day with 8 oz of water  Patient Instructions: 1)  Neti pot once daily until sinuses clear.No change in warfarin , but recheck in 7 days because of antibiotic therapy which might raise PT/INR.  2)  Drink as much fluid as you can tolerate for the next few days. Prescriptions: SMZ-TMP DS 800-160 MG TABS (SULFAMETHOXAZOLE-TRIMETHOPRIM) 1 two times a day with 8 oz of water  #20 x 0   Entered and Authorized by:   Marga Melnick MD   Signed by:   Marga Melnick MD on 03/21/2009   Method used:   Faxed to ...       [facility]* (retail)       [address] [place], [place]  [address]       Ph: [phone] or [phone]       Fax: [phone]   RxID:   [id number]    ANTICOAGULATION RECORD PREVIOUS REGIMEN & LAB RESULTS Anticoagulation Diagnosis:  Atrial fibrillation on  10/29/2008 Previous INR Goal Range:  2 - 3 on  06/28/2008 Previous INR:  2.3 on  10/29/2008 Previous Coumadin Dose(mg):  3mg  on  11/21/2008 Previous Regimen:  6 mg m.w.f.sun,4.5mg  tu th sa on  10/29/2008 Previous Coagulation Comments:  return 2 weeks on  10/29/2008  NEW  REGIMEN & LAB RESULTS Current INR: 2.5 Current Coumadin Dose(mg): 6mg  M/W/F, 4.5mg  all other days Regimen: 6 mg m.w.f.sun,4.5mg  tu th sa  (no change)  Provider: Mikiyah Glasner MEDICATIONS OMEPRAZOLE 20 MG CPDR (OMEPRAZOLE) 1 by mouth qd COUMADIN 3 MG TABS (WARFARIN SODIUM) Take as directed by coumadin clinic. CETIRIZINE HCL 10 MG TABS (CETIRIZINE HCL) 1 at bedtime as needed for allergies SINGULAIR 10 MG TABS (MONTELUKAST SODIUM) 1 once daily SERTRALINE HCL 50 MG TABS (SERTRALINE HCL) take 1/2 tab qd DILT-XR 180 MG XR24H-CAP (DILTIAZEM HCL) 1 by mouth once daily VICODIN 5-500 MG TABS (HYDROCODONE-ACETAMINOPHEN) as needed SMZ-TMP DS 800-160 MG TABS (SULFAMETHOXAZOLE-TRIMETHOPRIM) 1 two times a day with 8 oz of water

## 2010-04-08 NOTE — Progress Notes (Signed)
Summary: Order faxed for PT/INR  Phone Note Call from Patient   Caller: Patient Call For: Coumadin Clinic Summary of Call: Pt called states he is still out of town and needs order for PT/INR faxed to [facility] [phone].  Rx faxed.  Will await results. Initial call taken by: Cloyde Reams RN,  May 02, 2009 11:35 AM

## 2010-04-08 NOTE — Progress Notes (Signed)
Summary: Refill Request  Phone Note Refill Request Message from:  Pharmacy  Refills Requested: Medication #1:  SERTRALINE HCL 50 MG TABS take 1/2 tab qd   Notes: o [facility]   Method Requested: Fax to [facility] Initial call taken by: Shonna Chock,  March 26, 2009 4:56 PM    Prescriptions: SERTRALINE HCL 50 MG TABS (SERTRALINE HCL) take 1/2 tab qd  #45 x 3   Entered by:   Shonna Chock   Authorized by:   Marga Melnick MD   Signed by:   Shonna Chock on 03/26/2009   Method used:   Faxed to ...       [facility]* (mail-order)             ,          Ph: [phone]       Fax: [phone]   RxID:   [id number]

## 2010-04-08 NOTE — Letter (Signed)
Summary: [facility]  [facility]   Imported By: Roderic Ovens 08/28/2009 12:56:49  _____________________________________________________________________  External Attachment:    Type:   Image     Comment:   External Document

## 2010-04-08 NOTE — Medication Information (Signed)
Summary: Lab Orders  Lab Orders   Imported By: Marylou Mccoy 06/21/2009 11:56:33  _____________________________________________________________________  External Attachment:    Type:   Image     Comment:   External Document

## 2010-04-08 NOTE — Letter (Signed)
Summary: Surgical Clearance/[facility]  Surgical Clearance/[facility]   Imported By: Lanelle Bal 07/10/2009 10:20:34  _____________________________________________________________________  External Attachment:    Type:   Image     Comment:   External Document

## 2010-04-08 NOTE — Progress Notes (Signed)
Summary: appt for sx clearance [id number]  Phone Note Other Incoming   Summary of Call: per dr hopper patient needs ov with all meds & bp cuff - patient cleared by dr hochrein -- spoke with patient wife they are out of town & will return [phone] -- appt scheduled [phone] Initial call taken by: Okey Regal [place],  Jul 08, 2009 10:18 AM

## 2010-04-08 NOTE — Progress Notes (Signed)
Summary: need refill today going out town pt is out of  Coumadin  Phone Note Refill Request Message from:  Patient on October 18, 2009 10:27 AM  Refills Requested: Medication #1:  COUMADIN 3 MG TABS Take as directed by coumadin clinic. send to [facility] [phone]  Initial call taken by: Judie Grieve,  October 18, 2009 10:27 AM    Prescriptions: COUMADIN 3 MG TABS (WARFARIN SODIUM) Take as directed by coumadin clinic.  #144 x 0   Entered by:   Weston Brass PharmD   Authorized by:   Rollene Rotunda, MD, [facility]   Signed by:   Weston Brass PharmD on 10/18/2009   Method used:   Electronically to        [facility]* (retail)       [address] [place], [place]  [address]       Ph: [phone] or [phone]       Fax: [phone]   RxID:   [id number]

## 2010-04-08 NOTE — Progress Notes (Signed)
Summary: eye surgery - clearance letter from Dr. Dione Booze  Phone Note Call from Patient Call back at Home Phone [phone]   Caller: Spouse - karen  Reason for Call: Talk to Nurse Summary of Call: Pt having eye surgery on 9/12 @ 9a.m. office had send over clearance letter - Dr. Dione Booze office [phone].  Initial call taken by: Lorne Skeens,  November 13, 2009 9:58 AM  Follow-up for Phone Call        Pt is scheduled for cataract surgery for Mon 9/12 @ 9AM and received a call from Dr Laruth Bouchard office stating they had not received any reponse to their faxes for surgical clearance. Pt's wife reports Pt seems to be more tired, unable to sleep and having less energy, concerned if the surgery would make it worse and would like for Pt to see Dr Antoine Poche prior to surgery. RN explained that message would be forwarded to Dr Hochrein's Nurse -Elita Quick and she would determine if Pt could be "squeezed in" this week. Pam and Dr Antoine Poche will be in the office  tomorrow, Thurs 9/8. RN explained that eye surgery may need to be rescheduled. Pt's wife verbalizes understanding.  Pt's wife requested for Pam to call cell number [phone]. Follow-up by: Bernita Raisin, RN, BSN,  November 13, 2009 10:02 AM  Additional Follow-up for Phone Call Additional follow up Details #1::        I will add him on to the 9/8 schedule Additional Follow-up by: Rollene Rotunda, MD, [facility],  November 13, 2009 4:50 PM    Additional Follow-up for Phone Call Additional follow up Details #2::    attempted to call both cell and home number  lm to call for appt in the am.  Sander Nephew, RN  Pt aware to come today to be seen at 11:45 am Follow-up by: Charolotte Capuchin, RN,  November 14, 2009 8:29 AM

## 2010-04-08 NOTE — Progress Notes (Signed)
Summary: REFILL REQUEST  Phone Note Refill Request Call back at [phone] Message from:  Pharmacy on October 07, 2009 11:06 AM  Refills Requested: Medication #1:  SERTRALINE HCL 50 MG TABS 1 once daily   Dosage confirmed as above?Dosage Confirmed   Supply Requested: 1 month   Last Refilled: 07/25/2009 [facility].  Initial call taken by: Lavell Islam,  October 07, 2009 11:07 AM    Prescriptions: SERTRALINE HCL 50 MG TABS (SERTRALINE HCL) 1 once daily  #90 x 1   Entered by:   Shonna Chock CMA   Authorized by:   Marga Melnick MD   Signed by:   Shonna Chock CMA on 10/07/2009   Method used:   Electronically to        [facility]* (retail)       [address] [place], [place]  [address]       Ph: [phone] or [phone]       Fax: [phone]   RxID:   [id number]

## 2010-04-08 NOTE — Medication Information (Signed)
Summary: [facility]  Anticoagulant Therapy  Managed by: Cloyde Reams, RN, BSN Referring MD: Rollene Rotunda MD Supervising MD: Riley Kill MD, Maisie Fus Indication 1: Atrial fibrillation Lab Used: [facility] Site: [address] PT 21.6 INR POC 2.1 INR RANGE 2 - 3    Bleeding/hemorrhagic complications: no     Any changes in medication regimen? no     Any missed doses?: no         Allergies: 1)  Penicillin G Potassium (Penicillin G Potassium) 2)  Lamisil Advanced (Terbinafine)  Anticoagulation Management History:      His anticoagulation is being managed by telephone today.  Positive risk factors for bleeding include an age of 3 years or older.  Negative risk factors for bleeding include no history of CVA/TIA.  The bleeding index is 'intermediate risk'.  Positive CHADS2 values include History of HTN.  Negative CHADS2 values include Age > 45 years old, History of Diabetes, and Prior Stroke/CVA/TIA.  The start date was 03/19/2008.  His last INR was 2.5.  Prothrombin time is 21.6.  Anticoagulation responsible provider: Riley Kill MD, Maisie Fus.  INR POC: 2.1.    Anticoagulation Management Assessment/Plan:      The patient's current anticoagulation dose is Coumadin 3 mg tabs: Take as directed by coumadin clinic..  The target INR is 2 - 3.  The next INR is due 05/07/2009.  Anticoagulation instructions were given to patient.  Results were reviewed/authorized by Cloyde Reams, RN, BSN.  He was notified by Cloyde Reams, RN, BSN.         Prior Anticoagulation Instructions: INR 3.6  Skip today's dose then resume dose of 1.5 tablets daily except 2 tablets Mondays, Wednesdays, and Fridays. Recheck in 2 weeks.  Current Anticoagulation Instructions: LMOM in [place] # and cell #. Bethena Midget, RN, BSN  April 16, 2009 11:27 AM  Spoke with pt.  Advised to continue on same dosage of coumadin 1.5 tablets daily except 2 tablets on MWF.  Recheck in 3 weeks.  Pt is going [facility] for lab order,  as he will probably be out of town still.

## 2010-04-08 NOTE — Progress Notes (Signed)
Summary: b/p 145/92   Phone Note Call from Patient Call back at Home Phone [phone] Call back at Work Phone [phone]   Caller: Spouse- karen Reason for Call: Talk to Nurse Complaint: Breathing Problems Summary of Call: per pt wife calling c/o b/p 145/92, @ 2:15 took  again @ 2:16 155/99. some sob. pt having surgery 5/31.  Initial call taken by: Lorne Skeens,  Jul 11, 2009 2:22 PM  Follow-up for Phone Call        Spoke with pt's wife  They are visiting with family in [place], [place] and were at swim hall for grandchildren's swim meet. BP cuff was available there so pt checked BP and readings are noted above. Pt has not been checking BP prior to this. Wife also states he has cut back on salt a little but still using a lot of it. She states he has had increased SOB with walking and other activities and has been mored tired lately. No chest pain. I asked wife to check and record pt's BP twice daily (AM and PM) and to call us on Monday or Tuesday next week with these readings. Also instructed wife to have pt weigh daily and record and to let us know if has wt gain of 3 lbs. over 48 hours. Instucted wife pt should seek medical attention in [place] if SOB were to become more acute or other problems Follow-up by: Dossie Arbour, RN, BSN,  Jul 11, 2009 3:57 PM

## 2010-04-08 NOTE — Progress Notes (Signed)
  Phone Note Call from Patient   Caller: Patient Summary of Call: Pt having hip replacement surgery on 5/31, instructed to hold starting this thursday 5/26.  Instructions given by Dr. Antoine Poche and Dr. Curley Spice.  Pt reports that he will not need lovenox or bridging.  Pt will be hospitalized for 3 days then going to rehab.  I will not reshcedule an appt at this time since we do not know when pt will be discharged from rehab.  Patient will notify us when he is finished with rehab.   Initial call taken by: Eda Keys, PharmD 07/30/09 @ 2:30 pm

## 2010-04-08 NOTE — Assessment & Plan Note (Signed)
Summary: 1 mo f/u   Visit Type:  Follow-up Referring Provider:  Dr. Durene Romans Primary Provider:  Marga Melnick MD  CC:  Atrial Fibrillation.  History of Present Illness: The patient presents for preoperative evaluation prior to having left hip surgery. He has a history of atrial fibrillation. 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 This was identified at the [facility] clinic when he was there visiting family and atrial fibrillation. I have reviewed these records. This was in June of 2010. He had an echocardiogram demonstrating an EF of 62% with some mild aortic stenosis. This was a stress study and there was no evidence of ischemia. However, there was a suggestion of LV outflow tract obstruction dynamic with stress. He was then sent to [facility]. A month later a repeat echocardiogram with Valsalva did not confirm an LV outflow tract gradient. He saw Dr. Regino Schultze and no further cardiovascular testing was suggested. He has been on Coumadin since the diagnosis of atrial fibrillation. He is now being considered for left hip surgery.  He is limited because of hip and back problems. However, with activity he denies any chest pressure, neck or arm discomfort. He has no palpitations, presyncope or syncope. He denies any PND or orthopnea. He has been getting some left arm discomfort. However, this is clearly positional and happens when he raises his left arm for instance such as when he sits at the computer. He does not get this discomfort with other exertion.  Current Medications (verified): 1)  Omeprazole 20 Mg Cpdr (Omeprazole) .[name].. 1 By Mouth Qd 2)  Coumadin 3 Mg Tabs (Warfarin Sodium) .... Take As Directed By Coumadin Clinic. 3)  Sertraline Hcl 50 Mg Tabs (Sertraline Hcl) .... Take 1/2 Tab Qd 4)  Vicodin 5-500 Mg Tabs (Hydrocodone-Acetaminophen) .... As Needed 5)  Diltiazem Hcl Er Beads 240 Mg Xr24h-Cap (Diltiazem Hcl Er Beads) .... Take One Capsule By Mouth Daily  Allergies  (verified): 1)  Penicillin G Potassium (Penicillin G Potassium) 2)  Lamisil Advanced (Terbinafine)  Past History:  Past Medical History: Gout Hypertension Colon polyps Acid reflux Atrial fibrillation Benign prostatic hypertrophy Questionable congenital  spinal stenosis, Dr Eston Esters , [facility] s/p ESI with limited benefit ( x 2 - 3 weeks) Asthma/ AB/COPD Pulmonary embolism, hx of, post op 01/10 @ [facility] Pneumonia, hx of, 07/2008, [facility]; readmitted post PNAfor AF Diverticulosis/Diverticulitis, hx of , with rectal bleeding  Past Surgical History: Vasectomy Colonoscopy muliple times for polyps, no polyps 2008,Dr [facility] : nausea and vomiting dizzness 05/2002 Flex sig for rectal bleeding : diverticulitis Lumbar fusion &3  spacers for Spinal Stenosis 03/05/08; redo of 1 of 3 spacers 03/19/2008, Dr Denice Paradise; PTE post op  Review of Systems       As stated in the HPI and negative for all other systems.   Vital Signs:  Patient profile:   66 year old male Height:      72.75 inches Weight:      263 pounds BMI:     35.06 Pulse rate:   80 / minute Resp:     16 per minute BP sitting:   152 / 93  (right arm)  Vitals Entered By: Marrion Coy, CNA (July 04, 2009 4:27 PM)  Physical Exam  General:  Well developed, well nourished, in no acute distress. Head:  normocephalic and atraumatic Eyes:  PERRLA/EOM intact; conjunctiva and lids normal. Neck:  Neck supple, no JVD. No masses, thyromegaly or abnormal cervical nodes. Chest Wall:  no deformities or breast masses noted Lungs:  Clear bilaterally to auscultation and percussion. Abdomen:  Bowel sounds positive; abdomen soft and non-tender without masses, organomegaly, or hernias noted. No hepatosplenomegaly, obese Msk:  Back normal, normal gait. Muscle strength and tone normal. Extremities:  No clubbing or cyanosis. Neurologic:  Alert and oriented x 3. Skin:  Intact without lesions or rashes. Psych:   Normal affect.   Detailed Cardiovascular Exam  Neck    Carotids: Carotids full and equal bilaterally without bruits.      Neck Veins: Normal, no JVD.    Heart    Inspection: no deformities or lifts noted.      Palpation: normal PMI with no thrills palpable.      Auscultation: regular rate and rhythm, S1, S2 without murmurs, rubs, gallops, or clicks.    Vascular    Abdominal Aorta: no palpable masses, pulsations, or audible bruits.      Femoral Pulses: normal femoral pulses bilaterally.      Pedal Pulses: normal pedal pulses bilaterally.      Radial Pulses: normal radial pulses bilaterally.      Peripheral Circulation: no clubbing, cyanosis, or edema noted with normal capillary refill.     Impression & Recommendations:  Problem # 1:  PRE-OPERATIVE CARDIOVASCULAR EXAMINATION (ICD-V72.81) The patient had a normal stress echocardiogram in June of last year at the [facility] clinic. He has had no new symptoms consistent with ischemia. Therefore, according to ACC/AHA guidelines he would be at acceptable risk for the planned surgery. I would follow him postoperatively on telemetry as he is likely to have atrial fibrillation. I suspect he has some diastolic dysfunction and would need close followup of his fluids at the time of surgery.  Problem # 2:  ATRIAL FIBRILLATION (ICD-427.31) The patient has had paroxysmal atrial fibrillation. However, he was in sinus rhythm at the last visit and clinically again today. Despite a history of pulmonary embolism (which followed back surgery) I would not consider him a high enough risk to justify bridging anticoagulation. He can stop his Coumadin 5 days before surgery.  Problem # 3:  HYPERTENSION (ICD-401.9) His blood pressure is controlled by his report except for white coat hypertension. He needs to lose weight and keep a blood pressure diary to know whether he needs further adjustments to his medications.

## 2010-04-08 NOTE — Progress Notes (Signed)
Summary: pt weak, tired washed out  Phone Note Call from Patient   Caller: Spouse [phone] Call For: ` Reason for Call: Talk to Nurse Summary of Call: pt weak,tired,face washed out,seems to get this way when he's in a- fib, pt denies chest pain and sob-pls advise wife karen [phone] or [phone] x 2 days Initial call taken by: Glynda Jaeger,  October 15, 2009 2:05 PM  Follow-up for Phone Call        spoke with pt wife, pt has been very fatiqued and washed out. his wife states this is how he usually gets when he is in atrial fib. the pt is asleep at present, she is going to try to wake him up to check his pulse and see how he is feeling. explained to wife if pulse is over 100 he may need to go to the ER for eval if he is feeling really bad. pt scheduled for follow up with dr hochrein on friday this week but she will call me back today and discuss what is going on Deliah Goody, RN  October 15, 2009 2:49 PM   Additional Follow-up for Phone Call Additional follow up Details #1::        spoke with pt wife, she has checked his pulse and it was irregular. the rate was not elevated. the pt told his wife he would wait to see dr hochrein on friday but she is going to call me in the morning to let me know how he is doing. Deliah Goody, RN  October 15, 2009 5:44 PM

## 2010-04-08 NOTE — Medication Information (Signed)
Summary: [facility]  Anticoagulant Therapy  Managed by: Leota Sauers, PharmD Referring MD: Rollene Rotunda MD Supervising MD: Riley Kill MD, Maisie Fus Indication 1: Atrial fibrillation Lab Used: [facility] Site: [facility] INR POC 3.6 INR RANGE 2 - 3  Dietary changes: no    Health status changes: no    Bleeding/hemorrhagic complications: no    Recent/future hospitalizations: no    Any changes in medication regimen? yes       Details: Pt. started Bactrim DS 1/13. Will finish 1/22.  Recent/future dental: no  Any missed doses?: no       Is patient compliant with meds? yes       Allergies: 1)  Penicillin G Potassium (Penicillin G Potassium) 2)  Lamisil Advanced (Terbinafine)  Anticoagulation Management History:      The patient is taking warfarin and comes in today for a routine follow up visit.  Positive risk factors for bleeding include an age of 66 years or older.  Negative risk factors for bleeding include no history of CVA/TIA.  The bleeding index is 'intermediate risk'.  Positive CHADS2 values include History of HTN.  Negative CHADS2 values include Age > 73 years old, History of Diabetes, and Prior Stroke/CVA/TIA.  The start date was 03/19/2008.  His last INR was 2.5.  Anticoagulation responsible provider: Riley Kill MD, Maisie Fus.  INR POC: 3.6.  Cuvette Lot#: [id number].  Exp: 06/2010.    Anticoagulation Management Assessment/Plan:      The patient's current anticoagulation dose is Coumadin 3 mg tabs: Take as directed by coumadin clinic..  The target INR is 2 - 3.  The next INR is due 04/11/2009.  Anticoagulation instructions were given to patient.  Results were reviewed/authorized by Leota Sauers, PharmD.  He was notified by Lew Dawes, PharmD Candidate.         Prior Anticoagulation Instructions: INR 2.9 Continue 4.5mg s daily except 6mg s on Mondays, Wednesdays and Fridays. REcheck in 4 weeks.   Current Anticoagulation Instructions: INR 3.6  Skip today's dose then resume dose of 1.5  tablets daily except 2 tablets Mondays, Wednesdays, and Fridays. Recheck in 2 weeks.

## 2010-04-08 NOTE — Medication Information (Signed)
Summary: Physician Orders  Physician Orders   Imported By: Roderic Ovens 04/24/2009 11:05:13  _____________________________________________________________________  External Attachment:    Type:   Image     Comment:   External Document

## 2010-04-08 NOTE — Consult Note (Signed)
Summary: [facility]  [facility]   Imported By: Marylou Mccoy 09/04/2009 09:38:13  _____________________________________________________________________  External Attachment:    Type:   Image     Comment:   External Document

## 2010-04-08 NOTE — Progress Notes (Signed)
Summary: Refill Request to change dose instruction  Phone Note Refill Request Call back at [phone] Message from:  Pharmacy on Jul 25, 2009 9:59 AM  Refills Requested: Medication #1:  SERTRALINE HCL 50 MG TABS take 1/2 tab qd   Dosage confirmed as above?Dosage Confirmed   Supply Requested: 3 months [facility] Pt would like an rx for a 41-month supply and also feels like he needs a whole tablet instead of half. Can we get a new rx?  Next Appointment Scheduled: none Initial call taken by: Harold Barban,  Jul 25, 2009 9:59 AM  Follow-up for Phone Call        done Follow-up by: Marga Melnick MD,  Jul 25, 2009 6:28 PM    New/Updated Medications: SERTRALINE HCL 50 MG TABS (SERTRALINE HCL) 1 once daily Prescriptions: SERTRALINE HCL 50 MG TABS (SERTRALINE HCL) 1 once daily  #90 x 1   Entered and Authorized by:   Marga Melnick MD   Signed by:   Marga Melnick MD on 07/25/2009   Method used:   Faxed to ...       [facility]* (retail)       [address] [place], [place]  [address]       Ph: [phone] or [phone]       Fax: [phone]   RxID:   [id number]

## 2010-04-08 NOTE — Medication Information (Signed)
Summary: rov/tm  Anticoagulant Therapy  Managed by: Weston Brass, PharmD Referring MD: Rollene Rotunda MD PCP: Marga Melnick MD Supervising MD: Clifton James MD, Cristal Deer Indication 1: Atrial fibrillation Lab Used: [facility] Site: [address] INR POC 1.9 INR RANGE 2 - 3  Dietary changes: no    Health status changes: yes       Details: having cataract removal on 9/12 and then again in 2 weeks.   Bleeding/hemorrhagic complications: yes       Details: slight nose bleed over  a few days; only noticed when wiping nose  Recent/future hospitalizations: no    Any changes in medication regimen? no    Recent/future dental: no  Any missed doses?: no       Is patient compliant with meds? yes       Allergies: 1)  Penicillin G Potassium (Penicillin G Potassium) 2)  Lamisil Advanced (Terbinafine)  Anticoagulation Management History:      The patient is taking warfarin and comes in today for a routine follow up visit.  Positive risk factors for bleeding include an age of 12 years or older.  Negative risk factors for bleeding include no history of CVA/TIA.  The bleeding index is 'intermediate risk'.  Positive CHADS2 values include History of HTN.  Negative CHADS2 values include Age > 55 years old, History of Diabetes, and Prior Stroke/CVA/TIA.  The start date was 03/19/2008.  His last INR was 1.9.  Anticoagulation responsible provider: Clifton James MD, Cristal Deer.  INR POC: 1.9.  Cuvette Lot#: [id number].  Exp: 12/2010.    Anticoagulation Management Assessment/Plan:      The patient's current anticoagulation dose is Coumadin 3 mg tabs: Take as directed by coumadin clinic..  The target INR is 2 - 3.  The next INR is due 11/26/2009.  Anticoagulation instructions were given to patient.  Results were reviewed/authorized by Weston Brass, PharmD.  He was notified by Weston Brass PharmD.         Prior Anticoagulation Instructions: INR 2.7 Continue 4.5mg s daily except 6mg s on Mondays, Wednesdays and  Fridays. Recheck in 4 weeks.   Current Anticoagulation Instructions: INR 1.9  Take 2 tablets today then resume same dose of 1 1/2 tablets every day except 2 tablets on Monday, Wednesday and Friday.  Recheck INR in 4 weeks.

## 2010-04-08 NOTE — Progress Notes (Signed)
Summary: sleep study  Phone Note Call from Patient Call back at Home Phone [phone]   Caller: Spouse Summary of Call: Patient spouse called asking about patient sleep study. I made her aware that we do not have anything scanned it, but she can have it faxed to me directly at [phone]. Lucious Groves CMA  December 30, 2009 11:22 AM   Study received, please review and advise. Lucious Groves CMA  December 30, 2009 2:24 PM   Follow-up for Phone Call        He is supposed to F/U with Dr Marylou Flesher ,Sleep Specialist because both mild slep apnea & possible periodic leg movement will need to be assessed  for intervention Follow-up by: Marga Melnick MD,  December 30, 2009 3:41 PM  Additional Follow-up for Phone Call Additional follow up Details #1::        Patient spouse notified. Additional Follow-up by: Lucious Groves CMA,  December 30, 2009 4:03 PM

## 2010-04-08 NOTE — Progress Notes (Signed)
Summary: coumadin check  Phone Note Call from Patient Call back at Home Phone [phone] Call back at Work Phone [phone] Call back at [phone]- mom home #    Caller: Spouse- karen  Reason for Call: Talk to Nurse Details for Reason: Per pt wife calling from [place] , [facility] cancel appt last week due to leaving town. need a rx for pt/inr to be check @ [facility]  lab  - fax # [phone];  phone # [phone].  Initial call taken by: Lorne Skeens,  April 15, 2009 9:34 AM  Follow-up for Phone Call        Order faxed at 1215 to [facility] lab.  Patient wife notified at 1215..mp Follow-up by: Shelby Dubin PharmD, BCPS, CPP,  April 15, 2009 12:19 PM

## 2010-04-08 NOTE — Progress Notes (Signed)
Summary: REFILL REQUEST  Phone Note Refill Request Call back at [phone] Message from:  Pharmacy on October 04, 2009 10:12 AM  Refills Requested: Medication #1:  ZOLPIDEM TARTRATE 10 MG TABS 1 at bedtime prn.   Dosage confirmed as above?Dosage Confirmed   Supply Requested: 1 month   Last Refilled: 09/14/2009 [facility]  Initial call taken by: Lavell Islam,  October 04, 2009 10:13 AM  Follow-up for Phone Call        Last filled 09/12/09, I informed patient we can fill next week, too early to fill now.   Spoke with patient's wife and she agreed and will have her husband call next week for refill request Follow-up by: Shonna Chock CMA,  October 04, 2009 11:56 AM

## 2010-04-08 NOTE — Letter (Signed)
Summary: [facility]  [facility]   Imported By: Lanelle Bal 01/21/2010 09:31:19  _____________________________________________________________________  External Attachment:    Type:   Image     Comment:   External Document

## 2010-04-08 NOTE — Medication Information (Signed)
Summary: rov/cs  Anticoagulant Therapy  Managed by: Weston Brass, PharmD Referring MD: Rollene Rotunda MD PCP: Marga Melnick MD Supervising MD: Daleen Squibb MD, Maisie Fus Indication 1: Atrial fibrillation Lab Used: [facility] Site: [facility] INR POC 2.3 INR RANGE 2 - 3  Dietary changes: no    Health status changes: no    Bleeding/hemorrhagic complications: no    Recent/future hospitalizations: no    Any changes in medication regimen? no    Recent/future dental: no  Any missed doses?: no       Is patient compliant with meds? yes       Allergies: 1)  Penicillin G Potassium (Penicillin G Potassium) 2)  Lamisil Advanced (Terbinafine)  Anticoagulation Management History:      The patient is taking warfarin and comes in today for a routine follow up visit.  Positive risk factors for bleeding include an age of 59 years or older.  Negative risk factors for bleeding include no history of CVA/TIA.  The bleeding index is 'intermediate risk'.  Positive CHADS2 values include History of HTN.  Negative CHADS2 values include Age > 59 years old, History of Diabetes, and Prior Stroke/CVA/TIA.  The start date was 03/19/2008.  His last INR was 1.9.  Anticoagulation responsible Amar Sippel: Daleen Squibb MD, Maisie Fus.  INR POC: 2.3.  Cuvette Lot#: [id number].  Exp: 02/2011.    Anticoagulation Management Assessment/Plan:      The patient's current anticoagulation dose is Coumadin 3 mg tabs: Take as directed by coumadin clinic..  The target INR is 2 - 3.  The next INR is due 02/06/2010.  Anticoagulation instructions were given to patient.  Results were reviewed/authorized by Weston Brass, PharmD.  He was notified by Hoy Register, PharmD Candidate.         Prior Anticoagulation Instructions: INR 2.3  Continue Coumadin as scheduled:  2 tablets every day of the week except on 1 & 1/2 tablets on Monday and Friday.  Return to clinic in 4 weeks.  Current Anticoagulation Instructions: INR 2.3  Continue previous dose 2  tablets everyday except 1.5 tablets on Monday and Friday.  Recheck INR in 4 weeks.

## 2010-04-08 NOTE — Assessment & Plan Note (Signed)
Summary: FOLLOWUP FROM SURGERY//KN   Vital Signs:  Patient profile:   65 year old male Weight:      252.4 pounds Temp:     98.4 degrees F oral Pulse rate:   54 / minute Resp:     16 per minute BP sitting:   122 / 86  (left arm) Cuff size:   large  Vitals Entered By: Shonna Chock (September 12, 2009 11:49 AM) CC: F/U from surgery, Insomnia Comments REVIEWED MED LIST, PATIENT AGREED DOSE AND INSTRUCTION CORRECT    Primary Care Provider:  Marga Melnick MD  CC:  F/U from surgery and Insomnia.  History of Present Illness: S/P THR by Dr Charlann Boxer; his main complaint is insomnia which began after D/C from Rehab 06/23.Noise @ Rehab did disturb sleep.Pain pills & muscle relaxants don't help him get to sleep.   The patient reports difficulty falling asleep, frequent awakening due to op site pain, early awakening, and daytime somnolence, but denies nightmares, snoring, and apnea noted by partner.  Risk factors for insomnia include caffeine use, up to 5 cups of coffee "in am".  Behaviors that may contribute to insomnia include reading in bed, watching TV in bed, and occasional  daytime naps.  No PMH of sleep issues  Allergies: 1)  Penicillin G Potassium (Penicillin G Potassium) 2)  Lamisil Advanced (Terbinafine)  Review of Systems Resp:  Denies morning headaches. Psych:  Denies alternate hallucination ( auditory/visual), anxiety, depression, panic attacks, and unusual visions or sounds; He is weaning frequency of pain meds; that has not played a role in insomnia.No withdrawal symptoms as meds reduced.[name]  Physical Exam  General:  Appears tired but in no acute distress; alert,appropriate and cooperative throughout examination Mouth:  Oral mucosa and oropharynx without lesions or exudates.  Teeth in good repair. Patent oropharynx Lungs:  Normal respiratory effort, chest expands symmetrically. Lungs are clear to auscultation, no crackles or wheezes. Heart:  normal rate, regular rhythm, no gallop, no rub,  no JVD, and grade 1 /6 systolic murmur.   Pulses:  R and L carotid,radial,dorsalis pedis and posterior tibial pulses are full and equal bilaterally Extremities:  No clubbing, cyanosis, edema. Using 2 walking canes; gait deliberate & slow Neurologic:  alert & oriented X3.   Skin:  Intact without suspicious lesions or rashes Psych:  memory intact for recent and remote, normally interactive, good eye contact, not anxious appearing, and not depressed appearing.     Impression & Recommendations:  Problem # 1:  INSOMNIA-SLEEP DISORDER-UNSPEC (ICD-780.52)  Multifactorial:altered schedule, meds, poor sleep hygiene, caffeine excess  His updated medication list for this problem includes:    Zolpidem Tartrate 10 Mg Tabs (Zolpidem tartrate) .[name]... 1 at bedtime prn  Complete Medication List: 1)  Omeprazole 20 Mg Cpdr (Omeprazole) .[name].. 1 by mouth qd 2)  Coumadin 3 Mg Tabs (Warfarin sodium) .... Take as directed by coumadin clinic. 3)  Sertraline Hcl 50 Mg Tabs (Sertraline hcl) .[name].. 1 once daily 4)  Vicodin 5-500 Mg Tabs (Hydrocodone-acetaminophen) .... As needed 5)  Diltiazem Hcl Er Beads 240 Mg Xr24h-cap (Diltiazem hcl er beads) .... Take one capsule by mouth daily 6)  Methocarbamol 500 Mg Tabs (Methocarbamol) .... As needed 7)  Zolpidem Tartrate 10 Mg Tabs (Zolpidem tartrate) .[name].. 1 at bedtime prn  Patient Instructions: 1)  Sleep Hygiene as discussed . Wean caffeine by 1 cup  daily every 2-3 days until down to 1-2 cups/ day. Prescriptions: ZOLPIDEM TARTRATE 10 MG TABS (ZOLPIDEM TARTRATE) 1 at bedtime prn  #  15 x 0   Entered and Authorized by:   Marga Melnick MD   Signed by:   Marga Melnick MD on 09/12/2009   Method used:   Print then Give to Patient   RxID:   [id number]

## 2010-04-08 NOTE — Assessment & Plan Note (Signed)
Summary: flu shot//lch  Nurse Visit   Allergies: 1)  Penicillin G Potassium (Penicillin G Potassium) 2)  Lamisil Advanced (Terbinafine)  Orders Added: 1)  Flu Vaccine 34yrs + MEDICARE PATIENTS [Q2039] 2)  Administration Flu vaccine - MCR [G0008] Flu Vaccine Consent Questions     Do you have a history of severe allergic reactions to this vaccine? no    Any prior history of allergic reactions to egg and/or gelatin? no    Do you have a sensitivity to the preservative Thimersol? no    Do you have a past history of Guillan-Barre Syndrome? no    Do you currently have an acute febrile illness? no    Have you ever had a severe reaction to latex? no    Vaccine information given and explained to patient? yes    Are you currently pregnant? no    Lot Number:[id number]   Exp Date:09/06/2010   Site Given  Right Deltoid IMistration Flu vaccine - MCR [G0008] .lbmedflu

## 2010-04-08 NOTE — Op Note (Signed)
Summary: Cataract Removal/[facility]  Cataract Removal/[facility]   Imported By: Lanelle Bal 12/09/2009 12:08:07  _____________________________________________________________________  External Attachment:    Type:   Image     Comment:   External Document

## 2010-04-08 NOTE — Assessment & Plan Note (Signed)
Summary: surgical clearance per [facility]   Visit Type:  Pre-op Evaluation Referring [name]:  Dr. Dione [name] Primary [name]:  [name] Melnick MD  CC:  Atrial Fibrillation.  History of Present Illness: The patient was added on to my schedule today. There was a request for preoperative clearance prior to cataract surgery. At the time of that phone call his wife also mentioned that he was being very fatigued recently. However, on further questioning today in the office he says he is not sleeping well. He tried taking Ambien without improvement. He then feels very tired during the day. He has not noticed any palpitations, presyncope or syncope. He's had no chest pressure, neck or arm discomfort. He said no new shortness of breath. Of note at the last visit I approve him for hip surgery which he had apparently without problems.  Current Medications (verified): 1)  Omeprazole 20 Mg Cpdr (Omeprazole) .[name].. 1 By Mouth Qd 2)  Coumadin 3 Mg Tabs (Warfarin Sodium) .... Take As Directed By Coumadin Clinic. 3)  Sertraline Hcl 25 Mg Tabs (Sertraline Hcl) .[name].. 1 Once Daily 4)  Vicodin 5-500 Mg Tabs (Hydrocodone-Acetaminophen) .... As Needed 5)  Diltiazem Hcl Er Beads 240 Mg Xr24h-Cap (Diltiazem Hcl Er Beads) .... Take One Capsule By Mouth Daily 6)  Methocarbamol 500 Mg Tabs (Methocarbamol) .... As Needed  Allergies (verified): 1)  Penicillin G Potassium (Penicillin G Potassium) 2)  Lamisil Advanced (Terbinafine)  Past History:  Past Medical History: Last updated: 07/04/2009 Gout Hypertension Colon polyps Acid reflux Atrial fibrillation Benign prostatic hypertrophy Questionable congenital  spinal stenosis, Dr [name] , [facility] s/p ESI with limited benefit ( x 2 - 3 weeks) Asthma/ AB/COPD Pulmonary embolism, hx of, post op 01/10 @ [name] Pneumonia, hx of, 07/2008, [name]; readmitted post PNAfor AF Diverticulosis/Diverticulitis, hx of , with rectal bleeding  Past Surgical  History: Vasectomy Colonoscopy muliple times for polyps, no polyps 2008,Dr [name] : nausea and vomiting dizzness 05/2002 Flex sig for rectal bleeding : diverticulitis Lumbar fusion &3  spacers for Spinal Stenosis 03/05/08; redo of 1 of 3 spacers 03/19/2008, Dr Denice [name]; PTE post op Hip surgery  Review of Systems       As stated in the HPI and negative for all other systems.   Vital Signs:  Patient profile:   65 year old male Height:      73 inches Weight:      260 pounds BMI:     34.43 Pulse rate:   59 / minute BP sitting:   149 / 93  (left arm) Cuff size:   large  Vitals Entered By: [name] Kanaris, CNA (November 14, 2009 12:06 PM)  Physical Exam  General:  Well developed, well nourished, in no acute distress. Head:  normocephalic and atraumatic Neck:  Neck supple, no JVD. No masses, thyromegaly or abnormal cervical nodes. Chest Wall:  no deformities or breast masses noted Lungs:  Clear bilaterally to auscultation and percussion. Heart:  Non-displaced PMI, chest non-tender; regular rate and rhythm, S1, S2 without murmurs, rubs or gallops. Carotid upstroke normal, no bruit. Normal abdominal aortic size, no bruits. Femorals normal pulses, no bruits. Pedals normal pulses. No edema, no varicosities. Abdomen:  Bowel sounds positive; abdomen soft and non-tender without masses, organomegaly, or hernias noted. No hepatosplenomegaly, obese Msk:  Back normal, normal gait. Muscle strength and tone normal. Extremities:  No clubbing or cyanosis. Neurologic:  Alert and oriented x 3. Skin:  Intact without lesions or rashes. Cervical Nodes:  no  significant adenopathy Axillary Nodes:  no significant adenopathy Inguinal Nodes:  no significant adenopathy Psych:  Normal affect.   EKG  Procedure date:  11/14/2009  Findings:      Sinus rhythm, rate 59, axis within normal limits, intervals within normal limits, no acute ST-T wave changes  Impression &  Recommendations:  Problem # 1:  INSOMNIA-SLEEP DISORDER-UNSPEC (ICD-780.52) I have suggested over-the-counter melatonin and discussed wide range of dosing options with this. He will look into this. I also gave him a prescription for Lunesta which he may or may not have filled. He will not be taking the zolpidem.  Problem # 2:  PREOPERATIVE EXAMINATION (ICD-V72.84)  The patient is at acceptable risk for the planned procedure. No further testing is suggested.  Orders: EKG w/ Interpretation (93000)  Problem # 3:  ATRIAL FIBRILLATION (ICD-427.31)  He has had no symptomatic recurrence of this. Further testing is suggested.  Orders: EKG w/ Interpretation (93000)  Patient Instructions: 1)  Your physician recommends that you schedule a follow-up appointment as scheduled appt 2)  Your physician recommends that you continue on your current medications as directed. Please refer to the Current Medication list given to you today. 3)  May try Lunesta 2 mg at bedtime for sleep.  May also try Melatonin over the counter for sleep Prescriptions: DILTIAZEM HCL ER BEADS 240 MG XR24H-CAP (DILTIAZEM HCL ER BEADS) Take one capsule by mouth daily  #90 x 3   Entered by:   [name] Capuchin, RN   Authorized by:   [name] Rotunda, MD, [name]   Signed by:   [name] Capuchin, RN on 11/14/2009   Method used:   Faxed to ...       [name] (mail-order)             , [place]         Ph: [phone]       Fax: [phone]   RxID:   [id number] COUMADIN 3 MG TABS (WARFARIN SODIUM) Take as directed by coumadin clinic.  #144 x 3   Entered by:   [name] Capuchin, RN   Authorized by:   [name] Rotunda, MD, [name]   Signed by:   [name] Capuchin, RN on 11/14/2009   Method used:   Faxed to ...       [name] (mail-order)             , [place]         Ph: [phone]       Fax: [phone]   RxID:   [id number] COUMADIN 3 MG TABS (WARFARIN SODIUM) Take as directed by coumadin clinic.  #48 x 2   Entered by:    [name] Capuchin, RN   Authorized by:   [name] Rotunda, MD, [name]   Signed by:   [name] Capuchin, RN on 11/14/2009   Method used:   Electronically to        [facility]* (retail)       [address] [place], [place]  [address]       Ph: [phone] or [phone]       Fax: [phone]   RxID:   [id number] DILTIAZEM HCL ER BEADS 240 MG XR24H-CAP (DILTIAZEM HCL ER BEADS) Take one capsule by mouth daily  #30 x 3   Entered by:   [name] Capuchin, RN   Authorized by:   [name] Rotunda, MD, [name]  Signed by:   [name] Capuchin, RN on 11/14/2009   Method used:   Electronically to        [facility]* (retail)       [address] [place], [place]  [address]       Ph: [phone] or [phone]       Fax: [phone]   RxID:   [id number] LUNESTA 2 MG TABS (ESZOPICLONE) one at bedtime prn  #20 x 0   Entered by:   [name] Capuchin, RN   Authorized by:   [name] Rotunda, MD, [facility]   Signed by:   [name] Capuchin, RN on 11/14/2009   Method used:   Print then Give to Patient   RxID:   [id number]

## 2010-04-08 NOTE — Assessment & Plan Note (Signed)
Summary: pain in hip/pt check/kdc   Vital Signs:  Patient profile:   65 year old male Weight:      269 pounds BMI:     35.86 Temp:     98.3 degrees F oral Pulse rate:   60 / minute Resp:     16 per minute BP sitting:   140 / 82  (left arm) Cuff size:   large  Vitals Entered By: Shonna Chock (May 21, 2009 1:34 PM) CC: 1.) Left hip concerns   2.)Cough   3.)PT check Comments REVIEWED MED LIST, PATIENT AGREED DOSE AND INSTRUCTION CORRECT    CC:  1.) Left hip concerns   2.)Cough   3.)PT check.  History of Present Illness: L hip pain since 03/2008 after LS surgery for spinal stenosis  @ [facility].Films in 02/2009 : DJD @ [facility]. Steroid injection into hip  beneficial for  only 4 weeks .On 05/07/2009 films in [place] , [place] by Dr Mardene Speak revealed avascular necrosis & OA.Now intermittent aching to cutting pain  in L hip lasting until position changed, worse rolling over in bed. He can walk only 100 yardsbefore pain starts. Rx: Vicodin with intermittent response  Allergies: 1)  Penicillin G Potassium (Penicillin G Potassium) 2)  Lamisil Advanced (Terbinafine)  Physical Exam  General:  well-nourished,in no acute distress; alert,appropriate and cooperative throughout examination Extremities:  Severe pain with ROM L hip Neurologic:  strength normal in all extremities and DTRs symmetrical and normal.     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 As needed  Orders: Cardiology Referral (Cardiology) Orthopedic Referral (Ortho)  Problem # 2:  AVASCULAR NECROSIS (ICD-733.40)  Orders: Cardiology Referral (Cardiology) Dr Antoine Poche Orthopedic Referral (Ortho)  Problem # 3:  COUMADIN THERAPY (ICD-V58.61)  Orders: [facility] (33295JO) Prescription Created Electronically  [phone])  Problem # 4:  ATRIAL FIBRILLATION (ICD-427.31) PMH of His updated medication list for this problem includes:    Coumadin 3 Mg Tabs (Warfarin sodium) .[name]... Take as directed by coumadin clinic.    Dilt-xr 180 Mg Xr24h-cap (Diltiazem hcl) .[name]... 1 by mouth once daily  Orders: [facility] (06301SW)  Complete Medication List: 1)  Omeprazole 20 Mg Cpdr (Omeprazole) .[name].. 1 by mouth qd 2)  Coumadin 3 Mg Tabs (Warfarin sodium) .... Take as directed by coumadin clinic. 3)  Sertraline Hcl 50 Mg Tabs (Sertraline hcl) .... Take 1/2 tab qd 4)  Dilt-xr 180 Mg Xr24h-cap (Diltiazem hcl) .[name].. 1 by mouth once daily 5)  Vicodin 5-500 Mg Tabs (Hydrocodone-acetaminophen) .... As needed  Patient Instructions: 1)  No change in coumadin dose ( 3mg  two pills M,W & F and 3 mg one & 1/2 pills T, Th , Sat, Sun); recheck PT/INR  1 month Prescriptions: COUMADIN 3 MG TABS (WARFARIN SODIUM) Take as directed by coumadin clinic.  #144 x 0   Entered and Authorized by:   Marga Melnick MD   Signed by:   Marga Melnick MD on 05/21/2009   Method used:   Printed then faxed to ...       [facility]* (mail-order)             ,          Ph: [phone]       Fax: [phone]   RxID:   [id number]    ANTICOAGULATION RECORD PREVIOUS REGIMEN &  LAB RESULTS Anticoagulation Diagnosis:  Atrial fibrillation on  10/29/2008 Previous INR Goal Range:  2 - 3 on  06/28/2008 Previous INR:  2.5 on  03/21/2009 Previous Coumadin Dose(mg):  3mg  on  04/16/2009 Previous Regimen:  6 mg m.w.f.sun,4.5mg  tu th sa on  10/29/2008 Previous Coagulation Comments:  return 2 weeks on  10/29/2008  NEW REGIMEN & LAB RESULTS Current INR: 2.2 Current Coumadin Dose(mg): (3mg  tab)M/W/F 2 TABS ALL OTHER DAYS 1 1/2 TABS Regimen: 6 mg m.w.f.sun,4.5mg  tu th sa  (no change)  Provider: Sulamita Lafountain MEDICATIONS OMEPRAZOLE 20 MG CPDR (OMEPRAZOLE) 1 by mouth qd COUMADIN 3 MG TABS (WARFARIN SODIUM) Take as directed by coumadin  clinic. SERTRALINE HCL 50 MG TABS (SERTRALINE HCL) take 1/2 tab qd DILT-XR 180 MG XR24H-CAP (DILTIAZEM HCL) 1 by mouth once daily VICODIN 5-500 MG TABS (HYDROCODONE-ACETAMINOPHEN) as needed

## 2010-04-08 NOTE — Progress Notes (Signed)
Summary: [facility] Fire issue to address  Phone Note Call from Patient   Caller: Spouse Summary of Call: Pt wife left VM that she was unable to attend OV today but would like for dr Jaiden Dinkins to address pt  snoring badly at night and his sleep apnea.t..................Marland KitchenFelecia Deloach CMA  Jul 24, 2009 9:03 AM   Follow-up for Phone Call        noted Follow-up by: Marga Melnick MD,  Jul 24, 2009 9:21 AM

## 2010-04-08 NOTE — Medication Information (Signed)
Summary: rov/sp  Anticoagulant Therapy  Managed by: Weston Brass, PharmD Referring MD: Rollene Rotunda MD PCP: Marga Melnick MD Supervising MD: Daleen Squibb MD, Maisie Fus Indication 1: Atrial fibrillation Lab Used: [facility] Site: [facility] INR POC 1.6 INR RANGE 2 - 3  Dietary changes: no    Health status changes: no    Bleeding/hemorrhagic complications: no    Recent/future hospitalizations: no    Any changes in medication regimen? no    Recent/future dental: no  Any missed doses?: no       Is patient compliant with meds? yes       Allergies: 1)  Penicillin G Potassium (Penicillin G Potassium) 2)  Lamisil Advanced (Terbinafine)  Anticoagulation Management History:      The patient is taking warfarin and comes in today for a routine follow up visit.  Positive risk factors for bleeding include an age of 66 years or older.  Negative risk factors for bleeding include no history of CVA/TIA.  The bleeding index is 'intermediate risk'.  Positive CHADS2 values include History of HTN.  Negative CHADS2 values include Age > 12 years old, History of Diabetes, and Prior Stroke/CVA/TIA.  The start date was 03/19/2008.  His last INR was 1.9.  Anticoagulation responsible provider: Daleen Squibb MD, Maisie Fus.  INR POC: 1.6.  Cuvette Lot#: [id number].  Exp: 12/2010.    Anticoagulation Management Assessment/Plan:      The patient's current anticoagulation dose is Coumadin 3 mg tabs: Take as directed by coumadin clinic..  The target INR is 2 - 3.  The next INR is due 12/10/2009.  Anticoagulation instructions were given to patient.  Results were reviewed/authorized by Weston Brass, PharmD.  He was notified by Harrel Carina, PharmD candidate.         Prior Anticoagulation Instructions: INR 1.9  Take 2 tablets today then resume same dose of 1 1/2 tablets every day except 2 tablets on Monday, Wednesday and Friday.  Recheck INR in 4 weeks.   Current Anticoagulation Instructions: INR 1.6  Today take 2  tablets. Then change dose to 2 tablets everyday except take 1 1/2 tablets on Mondays and Fridays. Re-check INR in 2 weeks.

## 2010-04-08 NOTE — Progress Notes (Signed)
Summary:  sleep issue  Phone Note Call from Patient Call back at Home Phone [phone]   Caller: Spouse Summary of Call: pt wife states that [name] is working only on the night that he takes it. Pt wife states that on the night he take med pt wake up the next morning very tired and sometimes he has to go back to sleep.  And on all other night without med pt is still having trouble sleeping. pt wife states that it was discuss if this did not help to let you know. pt wife would like to know what is the next step.pls advise......................Marland KitchenFelecia Deloach CMA  November 07, 2009 9:36 AM   Follow-up for Phone Call        per dr hopper sleep specialist referral dr carmen dohmeier. Orders put in tried to call pt no answer will try again later..........Marland KitchenFelecia Deloach CMA  November 07, 2009 11:57 AM   pt wife aware, awaiting appt info...........Marland KitchenFelecia Deloach CMA  November 07, 2009 1:37 PM

## 2010-04-08 NOTE — Medication Information (Signed)
Summary: Coumadin Clinic  Anticoagulant Therapy  Managed by: Bethena Midget, RN, BSN Referring MD: Rollene Rotunda MD PCP: Marga Melnick MD Supervising MD: Jens Som MD, Arlys Everrett Indication 1: Atrial fibrillation Lab Used: [facility] Site: [address] PT 21.2 INR POC 2.1 INR RANGE 2 - 3  Dietary changes: no    Health status changes: no    Bleeding/hemorrhagic complications: no    Recent/future hospitalizations: no    Any changes in medication regimen? no    Recent/future dental: no  Any missed doses?: no       Is patient compliant with meds? yes      Comments: Pt states he was recently discharged from [facility].   Allergies: 1)  Penicillin G Potassium (Penicillin G Potassium) 2)  Lamisil Advanced (Terbinafine)  Anticoagulation Management History:      His anticoagulation is being managed by telephone today.  Positive risk factors for bleeding include an age of 66 years or older.  Negative risk factors for bleeding include no history of CVA/TIA.  The bleeding index is 'intermediate risk'.  Positive CHADS2 values include History of HTN.  Negative CHADS2 values include Age > 44 years old, History of Diabetes, and Prior Stroke/CVA/TIA.  The start date was 03/19/2008.  His last INR was 1.9.  Prothrombin time is 21.2.  Anticoagulation responsible provider: Jens Som MD, Arlys Tykwon.  INR POC: 2.1.    Anticoagulation Management Assessment/Plan:      The patient's current anticoagulation dose is Coumadin 3 mg tabs: Take as directed by coumadin clinic..  The target INR is 2 - 3.  The next INR is due 09/16/2009.  Anticoagulation instructions were given to patient.  Results were reviewed/authorized by Bethena Midget, RN, BSN.  He was notified by Bethena Midget, RN, BSN.         Prior Anticoagulation Instructions: INR 2.8  Continue same dose of 1 1/2 tablets every day except 2 tablets on Monday, Wednesday and Friday   Current Anticoagulation Instructions: INR 2.1 Continue 4.5mg s daily except  6mg s on MWF. Recheck in 2 weeks. Orders sent to [facility].

## 2010-04-08 NOTE — Letter (Signed)
Summary: Appointment - Missed  [facility], Main Office  [address]   [place], [place] [address]   Phone: [phone]  Fax: [phone]     October 22, 2009 MRN: [id number]   [facility] [address] [place], [place]  [address]   Dear Mr. [facility],  Our records indicate you missed your appointment on 10-18-2009 with  Dr. Antoine Poche    It is very important that we reach you to reschedule this appointment. We look forward to participating in your health care needs. Please contact us at the number listed above at your earliest convenience to reschedule this appointment.     Sincerely,       Lorne Skeens  [facility] Scheduling Team

## 2010-04-08 NOTE — Medication Information (Signed)
Summary: rov/tm  Anticoagulant Therapy  Managed by: Weston Brass, PharmD Referring MD: Rollene Rotunda MD Supervising MD: Clifton James MD, Cristal Deer Indication 1: Atrial fibrillation Lab Used: [facility] Site: [facility] INR POC 2.8 INR RANGE 2 - 3  Dietary changes: no    Health status changes: no    Bleeding/hemorrhagic complications: no    Recent/future hospitalizations: no    Any changes in medication regimen? no    Recent/future dental: no  Any missed doses?: no       Is patient compliant with meds? yes       Allergies: 1)  Penicillin G Potassium (Penicillin G Potassium) 2)  Lamisil Advanced (Terbinafine)  Anticoagulation Management History:      The patient is taking warfarin and comes in today for a routine follow up visit.  Positive risk factors for bleeding include an age of 66 years or older.  Negative risk factors for bleeding include no history of CVA/TIA.  The bleeding index is 'intermediate risk'.  Positive CHADS2 values include History of HTN.  Negative CHADS2 values include Age > 4 years old, History of Diabetes, and Prior Stroke/CVA/TIA.  The start date was 03/19/2008.  His last INR was 2.2.  Anticoagulation responsible provider: Clifton James MD, Cristal Deer.  INR POC: 2.8.  Cuvette Lot#: [id number].  Exp: 06/2010.    Anticoagulation Management Assessment/Plan:      The patient's current anticoagulation dose is Coumadin 3 mg tabs: Take as directed by coumadin clinic..  The target INR is 2 - 3.  The next INR is due 07/10/2009.  Anticoagulation instructions were given to patient.  Results were reviewed/authorized by Weston Brass, PharmD.  He was notified by Weston Brass PharmD.         Prior Anticoagulation Instructions: INR 1.9  Attempted to call with results.  LMOM TCB for results. Misty Rago RN  May 06, 2009 2:18 PM  05/07/09- Spoke with pt.he is still in [place],  he denies any changes or compliants. Today take 6mg s then resume 4.5mg s daily except 6mg s  MWF. He's aware to recheck in 2 weeks.   Current Anticoagulation Instructions: INR 2.8  Continue same dose of 1 1/2 tablets every day except 2 tablets on Monday, Wednesday and Friday

## 2010-04-08 NOTE — Assessment & Plan Note (Signed)
Summary: surgical clearance/jml   Visit Type:  Pre-op Evaluation  CC:  no complaints.  History of Present Illness: this is a 65 year old white male patient, who has been seen by Dr. Antoine Poche in the past for paroxysmal nature fibrillation, and had basal septal hypertrophy on an echo in 2009. He comes in today for preop evaluation for possible hip replacement, although he has his first orthopedist appointment April 13th.  The patient was placed on Coumadin for a pulmonary embolus after back surgery in January 2010. It was not felt he needed it for the one episode of atrial fibrillation when we saw him in 2009. In June 2010, while visiting his daughter in [place] had another recurrence of atrial fibrillation. He converted to normal sinus rhythm within 24 hours of hospitalization at the [facility] clinic treated by Dr. Maryjane Hurter. They did an echo and stress test and told him he had a hypertrophic cardiomyopathy and referred him to Dr. Gershon Crane at [facility]. He became concerned and referred him to a Dr. Regino Schultze also at [facility], who followed up with a CAT scan and 2-D echo. He told him he did not have a hypertrophic cardiomyopathy. He was told by both his cardiologist, that he should be on Coumadin for his paroxysmal atrial fibrillation.  The patient denies any chest pain, palpitations, dyspnea, dyspnea on exertion, dizziness, or presyncope. He does not exercise regularly because of his hip problem. He has multiple cardiac risk factors for heart disease with father dying of an MI at 62. A brother died at 2 of an MI and he had a 2 brothers have CABG in their 55s and 1s. He is overweight has hypertension.  The patient has not had a recurrence of atrial fibrillation since last June. He does drink a pot of coffee every day. He also has hypertension and is very liberal with the salt shaker.  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) 4)  Allergic Rhinitis Cause Unspecified  (ICD-477.9) 5)  Asthma Unspecified With Exacerbation  (ICD-493.92) 6)  Pulmonary Nodule, Right Lower Lobe  (ICD-518.89) 7)  Diverticulosis, Colon  (ICD-562.10) 8)  Diverticulitis, Hx of  (ICD-V12.79) 9)  Obstructive Cardiomyopathy  (ICD-425.4) 10)  Pneumonia, Hx of  (ICD-V12.60) 11)  Pulmonary Embolism, Hx of  (ICD-V12.51) 12)  COPD  (ICD-496) 13)  Coumadin Therapy  (ICD-V58.61) 14)  Other Pulmonary Embolism and Infarction  (ICD-415.19) 15)  Hyperglycemia  (ICD-790.29) 16)  Edema- Localized  (ICD-782.3) 17)  Foot Pain, Left  (ICD-729.5) 18)  Asthma  (ICD-493.90) 19)  Back Pain  (ICD-724.5) 20)  Other and Unspecified Hyperlipidemia  (ICD-272.4) 21)  Other Abnormal Blood Chemistry  (ICD-790.6) 22)  Hyperplasia Prostate Uns W/ur Obst & Oth Luts  (ICD-600.91) 23)  Atrial Fibrillation  (ICD-427.31) 24)  Nocturia  (ICD-788.43) 25)  Urinary Frequency  (ICD-788.41) 26)  Colonic Polyps  (ICD-211.3) 27)  Acid Reflux Disease  (ICD-530.81) 28)  Hypertension  (ICD-401.9) 29)  Gout  (ICD-274.9)  Current Medications (verified): 1)  Omeprazole 20 Mg Cpdr (Omeprazole) .[name].. 1 By Mouth Qd 2)  Coumadin 3 Mg Tabs (Warfarin Sodium) .... Take As Directed By Coumadin Clinic. 3)  Sertraline Hcl 50 Mg Tabs (Sertraline Hcl) .... Take 1/2 Tab Qd 4)  Dilt-Xr 180 Mg Xr24h-Cap (Diltiazem Hcl) .Marland KitchenMarland KitchenMarland Kitchen 1  By Mouth Once Daily 5)  Vicodin 5-500 Mg Tabs (Hydrocodone-Acetaminophen) .... 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 father CAD, colon cancer mother CAD, COAD 2 brothers CAD one brother MI age 60 deceased                           one brother died 76 ETOH ; no FH prostate disease  Social History: Reviewed history from 05/12/2007 and no changes required. Former Smoker Married  Tobacco Use - No.  Regular Exercise - no Smoking Status:  never  Review of Systems       see history of present illness  Vital Signs:  Patient profile:   65 year old male Height:      72.75 inches Weight:      266 pounds Pulse rate:   80 / minute Pulse rhythm:   regular BP sitting:   146 / 92  (left arm)  Vitals Entered By: Jacquelin Hawking, CMA (June 05, 2009 11:57 AM)  Physical Exam  General:  Obese, in no acute distress. Neck: No JVD, HJR, Bruit, or thyroid enlargement Lungs: No tachypnea, clear without wheezing, rales, or rhonchi Cardiovascular: RRR, PMI not displaced, 1/6 systolic murmur at the left sternal border, nobruit, thrill, or heave. Abdomen: BS normal. Soft without organomegaly, masses, lesions or tenderness. Extremities: without cyanosis, clubbing or edema. Good distal pulses bilateral SKin: Warm, no lesions or rashes  Musculoskeletal: No deformities Neuro: no focal signs    EKG  Procedure date:  06/05/2009  Findings:      normal sinus rhythm, normal EKG  Impression & Recommendations:  Problem # 1:  PRE-OPERATIVE CARDIOVASCULAR EXAMINATION (ICD-V72.81) Patient has not seen the orthopedist yet, but  is anticipating the need for possible hip replacement. This office  visit is scheduled for April 13. He is at high risk for surgery, as he's had pulmonary embolus in January 2010 following back surgery. The patient is on Coumadin. He has multiple issues that we are trying to obtain records on concerning a possible hypertrophic cardiomyopathy. We have requested records from [facility], as well as the [facility] clinic. His updated medication list for this problem includes:    Coumadin 3 Mg Tabs (Warfarin sodium) .[name]... Take as directed by coumadin clinic.    Dilt-xr 180 Mg Xr24h-cap (Diltiazem hcl) .[name]... 1 by mouth once daily    Diltiazem Hcl Er Beads 240 Mg Xr24h-cap (Diltiazem hcl er beads) .[name]... Take one capsule by mouth daily  Orders: EKG w/ Interpretation (93000)  Problem # 2:  OBSTRUCTIVE CARDIOMYOPATHY (ICD-425.4) 2-D echo in April 2009 in our office showed an ejection fraction of 55-60% with mild focal basal septal hypertrophy. The patient has been seen at [facility] and [facility] with concerns of a possible hypertrophic cardiomyopathy. The patient states that this was ruled out. We are waiting for these records. His updated medication list for  this problem includes:    Coumadin 3 Mg Tabs (Warfarin sodium) .[name]... Take as directed by coumadin clinic.    Dilt-xr 180 Mg Xr24h-cap (Diltiazem hcl) .[name]... 1 by mouth once daily    Diltiazem Hcl Er Beads 240 Mg Xr24h-cap (Diltiazem hcl er beads) .[name]... Take one capsule by mouth daily  Problem # 3:  PULMONARY EMBOLISM, HX OF (ICD-V12.51) Patient was originally placed on Coumadin in January 2010 for pulmonary embolus following back surgery. He was told by 2 cardiologists at different institutions that he needs to stand Coumadin for his paroxysmal atrial fibrillation. His updated medication list for this problem includes:    Coumadin 3 Mg Tabs (Warfarin sodium) .[name]... Take as directed by coumadin clinic.  Problem # 4:  ATRIAL FIBRILLATION (ICD-427.31) According to the patient his last episode of atrial fibrillation was in  June 2010. I asked him to decrease his caffeine intake to 2 cups per day. His updated medication list for this problem includes:    Coumadin 3 Mg Tabs (Warfarin sodium) .[name]... Take as directed by coumadin clinic.  Problem # 5:  HYPERTENSION (ICD-401.9) Patient's blood pressure is elevated today, and he admits toit being elevated for her while at home. I have increased his diltiazem to 240 mg per day. I had a long discussion concerning a 2 g sodium diet. He is very liberal with a salt shaker and this will be difficult for him. I believe he will need a second agent but  he wants to try this first. His updated medication list for this problem includes:    Dilt-xr 180 Mg Xr24h-cap (Diltiazem hcl) .[name]... 1 by mouth once daily    Diltiazem Hcl Er Beads 240 Mg Xr24h-cap (Diltiazem hcl er beads) .[name]... Take one capsule by mouth daily  Patient Instructions: 1)  Your physician recommends that you schedule a follow-up appointment in: 1 months with Dr. Antoine Poche after all records are obtained from [facility]. 2)  Your physician has requested that you limit the intake of sodium (salt) in your diet to two grams daily. Please see [facility] handout. 3)   Decrease caffein intake to 2 cups a day 4)  Your physician has recommended you make the following change in your medication: Start Diltiazem 240 mg take one tablet by mouth once a day. Prescriptions: DILTIAZEM HCL ER BEADS 240 MG XR24H-CAP (DILTIAZEM HCL ER BEADS) Take one capsule by mouth daily  #30 x 6   Entered by:   Ollen Gross, RN, BSN   Authorized by:   Marletta Lor, PA-C   Signed by:   Ollen Gross, RN, BSN on 06/05/2009   Method used:   Electronically to        [facility]* (retail)       [address] [place], [place]  [address]       Ph: [phone] or [phone]       Fax: [phone]   RxID:   [id number]

## 2010-04-08 NOTE — Medication Information (Signed)
Summary: rov/sp  Anticoagulant Therapy  Managed by: Bethena Midget, RN, BSN Referring MD: Rollene Rotunda MD PCP: Marga Melnick MD Supervising MD: Juanda Chance MD, Altamese Deguire Indication 1: Atrial fibrillation Lab Used: [facility] Site: [address] INR POC 2.7 INR RANGE 2 - 3  Dietary changes: no    Health status changes: no    Bleeding/hemorrhagic complications: no    Recent/future hospitalizations: no    Any changes in medication regimen? no    Recent/future dental: no  Any missed doses?: no       Is patient compliant with meds? yes       Allergies: 1)  Penicillin G Potassium (Penicillin G Potassium) 2)  Lamisil Advanced (Terbinafine)  Anticoagulation Management History:      The patient is taking warfarin and comes in today for a routine follow up visit.  Positive risk factors for bleeding include an age of 66 years or older.  Negative risk factors for bleeding include no history of CVA/TIA.  The bleeding index is 'intermediate risk'.  Positive CHADS2 values include History of HTN.  Negative CHADS2 values include Age > 21 years old, History of Diabetes, and Prior Stroke/CVA/TIA.  The start date was 03/19/2008.  His last INR was 1.9.  Anticoagulation responsible provider: Juanda Chance MD, Smitty Cords.  INR POC: 2.7.  Exp: 06/2010.    Anticoagulation Management Assessment/Plan:      The patient's current anticoagulation dose is Coumadin 3 mg tabs: Take as directed by coumadin clinic..  The target INR is 2 - 3.  The next INR is due 10/29/2009.  Anticoagulation instructions were given to patient.  Results were reviewed/authorized by Bethena Midget, RN, BSN.  He was notified by Bethena Midget, RN, BSN.         Prior Anticoagulation Instructions: INR 3.2  Spoke with pt's wife.  Take 3mg  today then resume same dose of 4.5mg  daily except 6mg  on Monday, Wednesday, and Friday.  Recheck INR in 2 weeks.  Pt discharged from [facility] today.  Current Anticoagulation Instructions: INR 2.7 Continue 4.5mg s daily  except 6mg s on Mondays, Wednesdays and Fridays. Recheck in 4 weeks.

## 2010-04-08 NOTE — Medication Information (Signed)
Summary: Coumadin Clinic  Anticoagulant Therapy  Managed by: Weston Brass, PharmD Referring MD: Rollene Rotunda MD PCP: Marga Melnick MD Supervising MD: Tenny Craw MD, Gunnar Fusi Indication 1: Atrial fibrillation Lab Used: [facility] Site: [facility] PT 32.3 INR POC 3.2 INR RANGE 2 - 3  Dietary changes: no    Health status changes: yes       Details: has not felt good over weekend   Bleeding/hemorrhagic complications: no    Recent/future hospitalizations: no    Any changes in medication regimen? yes       Details: started ambien for sleep  Recent/future dental: no  Any missed doses?: no       Is patient compliant with meds? yes       Allergies: 1)  Penicillin G Potassium (Penicillin G Potassium) 2)  Lamisil Advanced (Terbinafine)  Anticoagulation Management History:      His anticoagulation is being managed by telephone today.  Positive risk factors for bleeding include an age of 61 years or older.  Negative risk factors for bleeding include no history of CVA/TIA.  The bleeding index is 'intermediate risk'.  Positive CHADS2 values include History of HTN.  Negative CHADS2 values include Age > 34 years old, History of Diabetes, and Prior Stroke/CVA/TIA.  The start date was 03/19/2008.  His last INR was 1.9.  Prothrombin time is 32.3.  Anticoagulation responsible provider: Tenny Craw MD, Gunnar Fusi.  INR POC: 3.2.  Exp: 06/2010.    Anticoagulation Management Assessment/Plan:      The patient's current anticoagulation dose is Coumadin 3 mg tabs: Take as directed by coumadin clinic..  The target INR is 2 - 3.  The next INR is due 09/30/2009.  Anticoagulation instructions were given to patient.  Results were reviewed/authorized by Weston Brass, PharmD.  He was notified by Weston Brass PharmD.         Prior Anticoagulation Instructions: INR 2.1 Continue 4.5mg s daily except 6mg s on MWF. Recheck in 2 weeks. Orders sent to [facility].   Current Anticoagulation Instructions: INR 3.2  Spoke with pt's  wife.  Take 3mg  today then resume same dose of 4.5mg  daily except 6mg  on Monday, Wednesday, and Friday.  Recheck INR in 2 weeks.  Pt discharged from [facility] today.

## 2010-04-08 NOTE — Progress Notes (Signed)
  Faxed ROI over to [facility] to fax [phone] recieved records back forwarded to [facility]  June 05, 2009 3:39 PM    Appended Document:  Faxed ROI over to [facility] 3/30,4/6,4/12,4/14, recieved records back today gave to [place]

## 2010-04-10 NOTE — Assessment & Plan Note (Signed)
Summary: RETURN OV PER SPOUSE/KB   Vital Signs:  Patient profile:   65 year old male Weight:      268.4 pounds BMI:     35.54 Temp:     98.1 degrees F oral Pulse rate:   72 / minute Resp:     15 per minute BP sitting:   140 / 80  (left arm) Cuff size:   large  Vitals Entered By: Shonna Chock CMA (February 18, 2010 2:08 PM) CC: 1.) URI    2.) Discuss Shingles vaccine, URI symptoms   Primary Care Provider:  Marga Melnick MD  CC:  1.) URI    2.) Discuss Shingles vaccine and URI symptoms.  History of Present Illness:      This is a 65 year old man who presents with  protracted dry cough; S/P  Zpack in late 01/2010 with improvement.  The patient now  reports dry cough, but denies nasal congestion, purulent nasal discharge, sore throat, and earache.  Associated symptoms include dyspnea and wheezing.  The patient denies fever.  The patient denies headache.  The patient denies the following risk factors for Strep sinusitis: unilateral nasal discharge, tooth pain, and tender adenopathy.  Rx: Flovent  50 Discus. Note: not on ACE-I  Current Medications (verified): 1)  Omeprazole 20 Mg Cpdr (Omeprazole) .[name].. 1 By Mouth Qd 2)  Coumadin 3 Mg Tabs (Warfarin Sodium) .... Take As Directed By Coumadin Clinic. 3)  Sertraline Hcl 25 Mg Tabs (Sertraline Hcl) .[name].. 1 Once Daily 4)  Diltiazem Hcl Er Beads 240 Mg Xr24h-Cap (Diltiazem Hcl Er Beads) .... Take One Capsule By Mouth Daily 5)  Flovent Diskus 50 Mcg/blist Aepb (Fluticasone Propionate (Inhal)) .... Two Times A Day  Allergies: 1)  Penicillin G Potassium (Penicillin G Potassium) 2)  Lamisil Advanced (Terbinafine)  Review of Systems CV:  Denies chest pain or discomfort, difficulty breathing at night, and difficulty breathing while lying down. Resp:  Denies chest pain with inspiration and coughing up blood. Allergy:  Denies itching eyes and sneezing.  Physical Exam  General:  in no acute distress; alert,appropriate and cooperative throughout  examination Ears:  External ear exam shows no significant lesions or deformities.  Otoscopic examination reveals clear canals, tympanic membranes are intact bilaterally without bulging, retraction, inflammation or discharge. Hearing is grossly normal bilaterally. Nose:  External nasal examination shows no deformity or inflammation. Nasal mucosa are pink and moist without lesions or exudates. Mouth:  Oral mucosa and oropharynx without lesions or exudates.  Teeth in good repair. Lungs:  Normal respiratory effort, chest expands symmetrically. Lungs : scattered musical wheezes. Heart:  Normal rate and regular rhythm. S1 and S2 normal without gallop, murmur, click, rub.S4 Cervical Nodes:  No lymphadenopathy noted Axillary Nodes:  No palpable lymphadenopathy   Impression & Recommendations:  Problem # 1:  BRONCHITIS-ACUTE (ICD-466.0)  His updated medication list for this problem includes:    Flovent Diskus 50 Mcg/blist Aepb (Fluticasone propionate (inhal)) .[name]..[name] Two times a day    Dulera 200-5 Mcg/act Aero (Mometasone furo-formoterol fum) .[name]... 1 inhalation every 12 hrs ; gargle & spit after use    Singulair 10 Mg Tabs (Montelukast sodium) .[name]... 1 once daily until cough gone    Hydromet 5-1.5 Mg/48ml Syrp (Hydrocodone-homatropine) .[name]... 1 tsp every 6 hrs as needed for cough  Problem # 2:  ASTHMA NOS W/ACUTE EXACERBATION (ICD-493.92) Flare secondary to #1 His updated medication list for this problem includes:    Flovent Diskus 50 Mcg/blist Aepb (Fluticasone propionate (inhal)) .Marland KitchenMarland KitchenMarland KitchenMarland Kitchen  Two times a day    Dulera 200-5 Mcg/act Aero (Mometasone furo-formoterol fum) .[name]... 1 inhalation every 12 hrs ; gargle & spit after use    Singulair 10 Mg Tabs (Montelukast sodium) .[name]... 1 once daily until cough gone  Complete Medication List: 1)  Omeprazole 20 Mg Cpdr (Omeprazole) .[name].. 1 by mouth qd 2)  Coumadin 3 Mg Tabs (Warfarin sodium) .... Take as directed by coumadin clinic. 3)  Sertraline Hcl 25 Mg Tabs  (sertraline Hcl)  .[name].. 1 once daily 4)  Diltiazem Hcl Er Beads 240 Mg Xr24h-cap (Diltiazem hcl er beads) .... Take one capsule by mouth daily 5)  Flovent Diskus 50 Mcg/blist Aepb (Fluticasone propionate (inhal)) .... Two times a day 6)  Dulera 200-5 Mcg/act Aero (Mometasone furo-formoterol fum) .[name].. 1 inhalation every 12 hrs ; gargle & spit after use 7)  Singulair 10 Mg Tabs (Montelukast sodium) .[name].. 1 once daily until cough gone 8)  Hydromet 5-1.5 Mg/75ml Syrp (Hydrocodone-homatropine) .[name].. 1 tsp every 6 hrs as needed for cough  Patient Instructions: 1)  Drink as much fluid as you can tolerate for the next few days. Stop the Flovent; use 2 new Rxs. Prescriptions: HYDROMET 5-1.5 MG/5ML SYRP (HYDROCODONE-HOMATROPINE) 1 tsp every 6 hrs as needed for cough  #120 cc x 0   Entered and Authorized by:   Marga Melnick MD   Signed by:   Marga Melnick MD on 02/18/2010   Method used:   Print then Give to Patient   RxID:   [id number] SINGULAIR 10 MG TABS (MONTELUKAST SODIUM) 1 once daily until cough gone  #30 x 5   Entered and Authorized by:   Marga Melnick MD   Signed by:   Marga Melnick MD on 02/18/2010   Method used:   Print then Give to Patient   RxID:   [id number] DULERA 200-5 MCG/ACT AERO (MOMETASONE FURO-FORMOTEROL FUM) 1 inhalation every 12 hrs ; gargle & spit after use  #1 x 5   Entered and Authorized by:   Marga Melnick MD   Signed by:   Marga Melnick MD on 02/18/2010   Method used:   Print then Give to Patient   RxID:   [id number]    Orders Added: 1)  Est. Patient Level III [01027]

## 2010-04-10 NOTE — Consult Note (Signed)
Summary: [facility]  [facility]   Imported By: Lanelle Bal 02/27/2010 09:16:09  _____________________________________________________________________  External Attachment:    Type:   Image     Comment:   External Document

## 2010-04-10 NOTE — Medication Information (Signed)
Summary: rov/sp  Anticoagulant Therapy  Managed by: Weston Brass, PharmD Referring MD: Rollene Rotunda MD PCP: Marga Melnick MD Supervising MD: Excell Seltzer MD, Casimiro Needle Indication 1: Atrial fibrillation Lab Used: [facility] [place] Site: [address] INR POC 2.4 INR RANGE 2 - 3  Dietary changes: no    Health status changes: no    Bleeding/hemorrhagic complications: yes       Details: Nosebleeds every morning lasting <5 minutes  Recent/future hospitalizations: no    Any changes in medication regimen? no    Recent/future dental: no  Any missed doses?: no       Is patient compliant with meds? yes       Allergies: 1)  Penicillin G Potassium (Penicillin G Potassium) 2)  Lamisil Advanced (Terbinafine)  Anticoagulation Management History:      The patient is taking warfarin and comes in today for a routine follow up visit.  Positive risk factors for bleeding include an age of 70 years or older.  Negative risk factors for bleeding include no history of CVA/TIA.  The bleeding index is 'intermediate risk'.  Positive CHADS2 values include History of HTN.  Negative CHADS2 values include Age > 63 years old, History of Diabetes, and Prior Stroke/CVA/TIA.  The start date was 03/19/2008.  His last INR was 1.9.  Anticoagulation responsible provider: Excell Seltzer MD, Casimiro Needle.  INR POC: 2.4.  Cuvette Lot#: [id number].  Exp: 04/2011.    Anticoagulation Management Assessment/Plan:      The patient's current anticoagulation dose is Coumadin 3 mg tabs: Take as directed by coumadin clinic..  The target INR is 2 - 3.  The next INR is due 04/14/2010.  Anticoagulation instructions were given to patient.  Results were reviewed/authorized by Weston Brass, PharmD.  He was notified by Stephannie Peters, PharmD Candidate .         Prior Anticoagulation Instructions: INR 2.1  Continue same dose of 2 tablets every day except 1 1/2 tablets on Monday and Friday.  Recheck INR in 4 weeks.   Current  Anticoagulation Instructions: INR 2.4   Coumadin 3 mg tablets - Continue 2 tablets every day except 1.5 tablets on Monday and Friday

## 2010-04-10 NOTE — Assessment & Plan Note (Signed)
Summary: very tired/lathargic/sob/mt   Visit Type:  Follow-up Primary Provider:  Marga Melnick MD  CC:  Atrial fibrillation.  History of Present Illness: The patient presents for routine follow up.  At the last apptoinment he was complaining of fatigue and poor sleep.  For some reason his sleep is better thought he is still fatigued.  He has had one episode of tachycardia palpitations a few days ago. He felt his heart racing and thought it was similar to fibrillation although the rate did not increase with activity.  He did not have any chest pain, neck or arm discomfort.  He did not have presyncope or syncope.  He had no acute dyspnea, PND or orthopnea.  He has had no DOE but this has been chronic.  He does think that it is slowly progressive. I reviewed recent notes and he has had an echo in 2009.  He says his last stress test was in 2010 at the [facility] and that it was normal.   Current Medications (verified): 1)  Omeprazole 20 Mg Cpdr (Omeprazole) .[name].. 1 By Mouth Qd 2)  Coumadin 3 Mg Tabs (Warfarin Sodium) .... Take As Directed By Coumadin Clinic. 3)  Sertraline Hcl 25 Mg Tabs (Sertraline Hcl) .[name].. 1 Once Daily 4)  Diltiazem Hcl Er Beads 240 Mg Xr24h-Cap (Diltiazem Hcl Er Beads) .... Take One Capsule By Mouth Daily 5)  Flovent Diskus 50 Mcg/blist Aepb (Fluticasone Propionate (Inhal)) .... Two Times A Day 6)  Dulera 200-5 Mcg/act Aero (Mometasone Furo-Formoterol Fum) .[name].. 1 Inhalation Every 12 Hrs ; Gargle & Spit After Use 7)  Singulair 10 Mg Tabs (Montelukast Sodium) .[name].. 1 Once Daily Until Cough Gone 8)  Hydromet 5-1.5 Mg/36ml Syrp (Hydrocodone-Homatropine) .[name].. 1 Tsp Every 6 Hrs As Needed For Cough 9)  Methocarbamol 500 Mg Tabs (Methocarbamol) .... As Needed  Allergies (verified): 1)  Penicillin G Potassium (Penicillin G Potassium) 2)  Lamisil Advanced (Terbinafine)  Past History:  Past Medical History: Reviewed history from 07/04/2009 and no changes  required. Gout Hypertension Colon polyps Acid reflux Atrial fibrillation Benign prostatic hypertrophy Questionable congenital  spinal stenosis, Dr Eston Esters , [facility] s/p ESI with limited benefit ( x 2 - 3 weeks) Asthma/ AB/COPD Pulmonary embolism, hx of, post op 01/10 @ [facility] Pneumonia, hx of, 07/2008, [facility]; readmitted post PNAfor AF Diverticulosis/Diverticulitis, hx of , with rectal bleeding  Past Surgical History: Reviewed history from 11/14/2009 and no changes required. Vasectomy Colonoscopy muliple times for polyps, no polyps 2008,Dr [facility] : nausea and vomiting dizzness 05/2002 Flex sig for rectal bleeding : diverticulitis Lumbar fusion &3  spacers for Spinal Stenosis 03/05/08; redo of 1 of 3 spacers 03/19/2008, Dr Denice Paradise; PTE post op Hip surgery  Review of Systems       As stated in the HPI and negative for all other systems.   Vital Signs:  Patient profile:   65 year old male Height:      73 inches Weight:      273 pounds BMI:     36.15 Pulse rate:   70 / minute Resp:     16 per minute BP sitting:   152 / 70  (right arm)  Vitals Entered By: Marrion Coy, CNA (March 17, 2010 2:45 PM)  Physical Exam  General:  Well developed, well nourished, in no acute distress. Head:  normocephalic and atraumatic Eyes:  PERRLA/EOM intact; conjunctiva and lids normal. Mouth:  Teeth, gums and palate normal. Oral mucosa normal. Neck:  Neck supple, no JVD. No masses, thyromegaly or abnormal cervical nodes. Chest Wall:  no deformities  Lungs:  Clear bilaterally to auscultation and percussion. Abdomen:  Bowel sounds positive; abdomen soft and non-tender without masses, organomegaly, or hernias noted. No hepatosplenomegaly, obese Msk:  Back normal, normal gait. Muscle strength and tone normal. Extremities:  No clubbing or cyanosis. Neurologic:  Alert and oriented x 3. Skin:  Intact without lesions or rashes. Cervical Nodes:  No  lymphadenopathy noted Inguinal Nodes:  no significant adenopathy Psych:  Normal affect.   Detailed Cardiovascular Exam  Neck    Carotids: Carotids full and equal bilaterally without bruits.      Neck Veins: Normal, no JVD.    Heart    Inspection: no deformities or lifts noted.      Palpation: normal PMI with no thrills palpable.      Auscultation: regular rate and rhythm, S1, S2 without murmurs, rubs, gallops, or clicks.    Vascular    Abdominal Aorta: no palpable masses, pulsations, or audible bruits.      Femoral Pulses: normal femoral pulses bilaterally.      Pedal Pulses: R and L carotid,radial,dorsalis pedis and posterior tibial pulses are full and equal bilaterally    Radial Pulses: normal radial pulses bilaterally.      Peripheral Circulation: no clubbing, cyanosis, or edema noted with normal capillary refill.     EKG  Procedure date:  03/17/2010  Findings:      NSR, rate 70, axis WNL, no acute ST T wave changes.  Impression & Recommendations:  Problem # 1:  ATRIAL FIBRILLATION (ICD-427.31) The patient might have had an episode of atrial fibrillation this weekend.  This would be his third or fourth episode in the last several years.  He does not want to consider an antiarrhythmic for these infrequent symptoms.  I will give him beta blocker to take as needed should he have any tachypalpitations.  If they increase in frequency or severity he will let me know and we will reconsider our strategy.  Orders: EKG w/ Interpretation (93000)  Problem # 2:  DYSPNEA (ICD-786.05) I will start with a BPN level.  If this is normal no further work up will be planned.  I suspect that this is a primary pulmonary problem coupled with obesity and lack of exercise. Orders: TLB-BNP (B-Natriuretic Peptide) (83880-BNPR) EKG w/ Interpretation (93000)  Problem # 3:  HYPERTENSION (ICD-401.9) HIs blood pressure is slightly elevated.  I reviewed previous readings and this is not the usual.  I  have asked him to lose weight for better blood pressure control.  We discussed specific strategies.  Problem # 4:  MORBID OBESITY (ICD-278.01) He used to swim.  I asked him to get back to routine exercise and to stop eating bread and potatoes  Patient Instructions: 1)  Your physician recommends that you schedule a follow-up appointment in: 6 months with Dr Antoine Poche 2)  Your physician recommends that you have lab work today  BNP 3)  Your physician recommends that you continue on your current medications as directed. Please refer to the Current Medication list given to you today. Prescriptions: METOPROLOL SUCCINATE 25 MG XR24H-TAB (METOPROLOL SUCCINATE) one daily as needed  #30 x 6   Entered by:   Charolotte Capuchin, RN   Authorized by:   Rollene Rotunda, MD, [facility]   Signed by:   Charolotte Capuchin, RN on 03/17/2010   Method used:   Electronically to        [facility]* (retail)       [address] [place], [place]  [address]       Ph: [phone] or [phone]       Fax: [phone]   RxID:   [id number]  I have reviewed and approved all prescriptions at the time of this visit. Rollene Rotunda, MD, [facility]  March 17, 2010 3:36 PM

## 2010-04-10 NOTE — Medication Information (Signed)
Summary: rov/nb  Anticoagulant Therapy  Managed by: Weston Brass, PharmD Referring MD: Rollene Rotunda MD PCP: Marga Melnick MD Supervising MD: Shirlee Latch MD, [place] Indication 1: Atrial fibrillation Lab Used: [facility] [place] Site: [address] INR POC 2.1 INR RANGE 2 - 3  Dietary changes: no    Health status changes: no    Bleeding/hemorrhagic complications: yes       Details: small nosebleeds in AM when he wakes up.  Suggested nasal saline prior to going to bed  Recent/future hospitalizations: no    Any changes in medication regimen? no    Recent/future dental: no  Any missed doses?: no       Is patient compliant with meds? yes       Allergies: 1)  Penicillin G Potassium (Penicillin G Potassium) 2)  Lamisil Advanced (Terbinafine)  Anticoagulation Management History:      The patient is taking warfarin and comes in today for a routine follow up visit.  Positive risk factors for bleeding include an age of 33 years or older.  Negative risk factors for bleeding include no history of CVA/TIA.  The bleeding index is 'intermediate risk'.  Positive CHADS2 values include History of HTN.  Negative CHADS2 values include Age > 8 years old, History of Diabetes, and Prior Stroke/CVA/TIA.  The start date was 03/19/2008.  His last INR was 1.9.  Anticoagulation responsible provider: Shirlee Latch MD, [place].  INR POC: 2.1.  Cuvette Lot#: [id number].  Exp: 03/2011.    Anticoagulation Management Assessment/Plan:      The patient's current anticoagulation dose is Coumadin 3 mg tabs: Take as directed by coumadin clinic..  The target INR is 2 - 3.  The next INR is due 03/19/2010.  Anticoagulation instructions were given to patient.  Results were reviewed/authorized by Weston Brass, PharmD.  He was notified by Weston Brass PharmD.         Prior Anticoagulation Instructions: INR 2.3  Continue previous dose 2 tablets everyday except 1.5 tablets on Monday and Friday.  Recheck INR in 4 weeks.    Current Anticoagulation Instructions: INR 2.1  Continue same dose of 2 tablets every day except 1 1/2 tablets on Monday and Friday.  Recheck INR in 4 weeks.

## 2010-04-10 NOTE — Medication Information (Signed)
Summary: Care Consideration Regarding Sertraline & Restless Leg Syndrome/  Care Consideration Regarding Sertraline & Restless Leg Syndrome/[employer]   Imported By: Lanelle Bal 03/14/2010 10:56:33  _____________________________________________________________________  External Attachment:    Type:   Image     Comment:   External Document

## 2010-04-14 ENCOUNTER — Encounter: Payer: Self-pay | Admitting: Cardiology

## 2010-04-14 ENCOUNTER — Encounter (INDEPENDENT_AMBULATORY_CARE_PROVIDER_SITE_OTHER): Payer: Medicare Other

## 2010-04-14 DIAGNOSIS — I4891 Unspecified atrial fibrillation: Secondary | ICD-10-CM

## 2010-04-14 DIAGNOSIS — Z7901 Long term (current) use of anticoagulants: Secondary | ICD-10-CM

## 2010-04-14 LAB — CONVERTED CEMR LAB: POC INR: 2.3

## 2010-04-24 NOTE — Medication Information (Signed)
Summary: Coumadin Clinic  Anticoagulant Therapy  Managed by: Windell Hummingbird, RN Referring MD: Rollene Rotunda MD PCP: Marga Melnick MD Supervising MD: Jens Som MD, Arlys Deny Indication 1: Atrial fibrillation Lab Used: [facility] [place] Site: [address] INR POC 2.3 INR RANGE 2 - 3  Dietary changes: no    Health status changes: no    Bleeding/hemorrhagic complications: no    Recent/future hospitalizations: no    Any changes in medication regimen? no    Recent/future dental: no  Any missed doses?: no       Is patient compliant with meds? yes       Allergies: 1)  Penicillin G Potassium (Penicillin G Potassium) 2)  Lamisil Advanced (Terbinafine)  Anticoagulation Management History:      The patient is taking warfarin and comes in today for a routine follow up visit.  Positive risk factors for bleeding include an age of 66 years or older.  Negative risk factors for bleeding include no history of CVA/TIA.  The bleeding index is 'intermediate risk'.  Positive CHADS2 values include History of HTN.  Negative CHADS2 values include Age > 42 years old, History of Diabetes, and Prior Stroke/CVA/TIA.  The start date was 03/19/2008.  His last INR was 1.9.  Anticoagulation responsible provider: Jens Som MD, Arlys Ryver.  INR POC: 2.3.  Cuvette Lot#: [id number].  Exp: 03/2011.    Anticoagulation Management Assessment/Plan:      The patient's current anticoagulation dose is Coumadin 3 mg tabs: Take as directed by coumadin clinic..  The target INR is 2 - 3.  The next INR is due 05/12/2010.  Anticoagulation instructions were given to patient.  Results were reviewed/authorized by Windell Hummingbird, RN.  He was notified by Windell Hummingbird, RN.         Prior Anticoagulation Instructions: INR 2.4   Coumadin 3 mg tablets - Continue 2 tablets every day except 1.5 tablets on Monday and Friday   Current Anticoagulation Instructions: INR 2.3 Continue taking 2 tablets every day, except take 1.5 on Mondays  and Fridays. Recheck in 4 weeks

## 2010-05-06 ENCOUNTER — Encounter: Payer: Self-pay | Admitting: Cardiology

## 2010-05-06 DIAGNOSIS — I4891 Unspecified atrial fibrillation: Secondary | ICD-10-CM

## 2010-05-06 DIAGNOSIS — Z86718 Personal history of other venous thrombosis and embolism: Secondary | ICD-10-CM

## 2010-05-07 ENCOUNTER — Encounter: Payer: Self-pay | Admitting: Cardiovascular Disease

## 2010-05-07 ENCOUNTER — Encounter (INDEPENDENT_AMBULATORY_CARE_PROVIDER_SITE_OTHER): Payer: Medicare Other

## 2010-05-07 DIAGNOSIS — Z7901 Long term (current) use of anticoagulants: Secondary | ICD-10-CM

## 2010-05-07 DIAGNOSIS — I4891 Unspecified atrial fibrillation: Secondary | ICD-10-CM

## 2010-05-15 NOTE — Medication Information (Signed)
Summary: rov/pc  Anticoagulant Therapy  Managed by: Weston Brass, PharmD Referring MD: Rollene Rotunda MD PCP: Marga Melnick MD Supervising MD: Excell Seltzer MD, Casimiro Needle Indication 1: Atrial fibrillation Lab Used: [facility] [place] Site: [address] INR POC 2.1 INR RANGE 2 - 3  Dietary changes: no    Health status changes: no    Bleeding/hemorrhagic complications: no    Recent/future hospitalizations: no    Any changes in medication regimen? no    Recent/future dental: no  Any missed doses?: no       Is patient compliant with meds? yes       Allergies: 1)  Penicillin G Potassium (Penicillin G Potassium) 2)  Lamisil Advanced (Terbinafine)  Anticoagulation Management History:      The patient is taking warfarin and comes in today for a routine follow up visit.  Positive risk factors for bleeding include an age of 45 years or older.  Negative risk factors for bleeding include no history of CVA/TIA.  The bleeding index is 'intermediate risk'.  Positive CHADS2 values include History of HTN.  Negative CHADS2 values include Age > 75 years old, History of Diabetes, and Prior Stroke/CVA/TIA.  The start date was 03/19/2008.  His last INR was 1.9.  Anticoagulation responsible provider: Excell Seltzer MD, Casimiro Needle.  INR POC: 2.1.  Cuvette Lot#: [id number].  Exp: 03/2011.    Anticoagulation Management Assessment/Plan:      The patient's current anticoagulation dose is Coumadin 3 mg tabs: Take as directed by coumadin clinic..  The target INR is 2 - 3.  The next INR is due 06/04/2010.  Anticoagulation instructions were given to patient.  Results were reviewed/authorized by Weston Brass, PharmD.  He was notified by Margot Chimes PharmD Candidate.         Prior Anticoagulation Instructions: INR 2.3 Continue taking 2 tablets every day, except take 1.5 on Mondays and Fridays. Recheck in 4 weeks  Current Anticoagulation Instructions: INR 2.1  Continue to take 2 tablets daily except on  Mondays and Fridays when you only take 1.5 tablets.  Recheck INR in 4 weeks.

## 2010-05-26 LAB — DIFFERENTIAL
Basophils Absolute: 0 10*3/uL (ref 0.0–0.1)
Basophils Relative: 0 % (ref 0–1)
Monocytes Relative: 10 % (ref 3–12)
Neutro Abs: 6.3 10*3/uL (ref 1.7–7.7)
Neutrophils Relative %: 64 % (ref 43–77)

## 2010-05-26 LAB — BASIC METABOLIC PANEL
BUN: 8 mg/dL (ref 6–23)
CO2: 29 mEq/L (ref 19–32)
Calcium: 8 mg/dL — ABNORMAL LOW (ref 8.4–10.5)
Calcium: 8.5 mg/dL (ref 8.4–10.5)
Calcium: 9.6 mg/dL (ref 8.4–10.5)
Creatinine, Ser: 0.89 mg/dL (ref 0.4–1.5)
Creatinine, Ser: 1.09 mg/dL (ref 0.4–1.5)
GFR calc Af Amer: >60 mL/min (ref >60)
GFR calc Af Amer: >60 mL/min (ref >60)
GFR calc non Af Amer: >60 mL/min (ref >60)
Glucose, Bld: 118 mg/dL — ABNORMAL HIGH (ref 70–99)
Sodium: 137 mEq/L (ref 135–145)

## 2010-05-26 LAB — CBC
MCHC: 33.4 g/dL (ref 30.0–36.0)
Platelets: 293 10*3/uL (ref 150–400)
RBC: 3.17 MIL/uL — ABNORMAL LOW (ref 4.22–5.81)
RBC: 4.03 MIL/uL — ABNORMAL LOW (ref 4.22–5.81)
RDW: 14.8 % (ref 11.5–15.5)
WBC: 16.3 10*3/uL — ABNORMAL HIGH (ref 4.0–10.5)
WBC: 9.9 10*3/uL (ref 4.0–10.5)

## 2010-05-26 LAB — PROTIME-INR
INR: 1.27 (ref 0.00–1.49)
INR: 1.4 (ref 0.00–1.49)
INR: 1.46 (ref 0.00–1.49)
INR: 2.29 — ABNORMAL HIGH (ref 0.00–1.49)
Prothrombin Time: 17 seconds — ABNORMAL HIGH (ref 11.6–15.2)
Prothrombin Time: 25 seconds — ABNORMAL HIGH (ref 11.6–15.2)

## 2010-05-26 LAB — APTT
aPTT: 21 seconds — ABNORMAL LOW (ref 24–37)
aPTT: 43 seconds — ABNORMAL HIGH (ref 24–37)

## 2010-05-26 LAB — URINALYSIS, ROUTINE W REFLEX MICROSCOPIC
Bilirubin Urine: NEGATIVE
Ketones, ur: NEGATIVE mg/dL
Nitrite: NEGATIVE
Protein, ur: NEGATIVE mg/dL
Specific Gravity, Urine: 1.018 (ref 1.005–1.030)
Urobilinogen, UA: 0.2 mg/dL (ref 0.0–1.0)

## 2010-05-26 LAB — ABO/RH: ABO/RH(D): O NEG

## 2010-06-04 ENCOUNTER — Ambulatory Visit (INDEPENDENT_AMBULATORY_CARE_PROVIDER_SITE_OTHER): Payer: Medicare Other | Admitting: *Deleted

## 2010-06-04 DIAGNOSIS — Z86718 Personal history of other venous thrombosis and embolism: Secondary | ICD-10-CM

## 2010-06-04 DIAGNOSIS — I4891 Unspecified atrial fibrillation: Secondary | ICD-10-CM

## 2010-06-04 LAB — POCT INR: INR: 2.3

## 2010-06-04 NOTE — Patient Instructions (Signed)
INR 2.3  Coumadin 3mg  tabs Take 1.5 tab on MON and FRI 2 tabs all other days

## 2010-06-05 ENCOUNTER — Other Ambulatory Visit: Payer: Self-pay | Admitting: *Deleted

## 2010-06-05 MED ORDER — SERTRALINE HCL 50 MG PO TABS: 50.0000 mg | ORAL_TABLET | Freq: Every day | ORAL | Status: DC

## 2010-06-16 ENCOUNTER — Other Ambulatory Visit: Payer: Self-pay | Admitting: Orthopedic Surgery

## 2010-06-16 ENCOUNTER — Telehealth: Payer: Self-pay | Admitting: Cardiology

## 2010-06-16 DIAGNOSIS — M4800 Spinal stenosis, site unspecified: Secondary | ICD-10-CM

## 2010-06-16 NOTE — Telephone Encounter (Signed)
[name] [name] from [facility] states she has send a request x4 to [facility] regarding pt needed to be off coumadin for 4 days prior Lumbar Mylogram. [name] [name] states pt is in a lot of pain (back pain) I let [name] [name] know that Dr. Antoine Poche in on vacation this week. She would like to know if another [name] can make the recommendation . Dr. Shirlee Latch [name] DOD recommended patient to be off coumadin for 4 days prior procedure if patient does not have a history of stroke. Pt to re-start coumadin as soon as possible.

## 2010-06-16 NOTE — Telephone Encounter (Signed)
[name] [name] aware. Note of coumadin clearance faxed to [phone].

## 2010-06-16 NOTE — Telephone Encounter (Signed)
Pt needs a CT for back pain. Pt needs clearance for coumadin.

## 2010-06-16 NOTE — Telephone Encounter (Deleted)
Danielle from [facility] states she has send a request x4 to [facility] regarding pt needed to be off coumadin for 4 days prior Lumbar Mylogram. Duwayne Heck states pt is in a lot of pain (back pain) I let Duwayne Heck know that Dr. Antoine Poche in on vacation this week. She would like to know if another MD can make the recommendation .

## 2010-06-17 ENCOUNTER — Ambulatory Visit: Payer: Medicare Other | Admitting: Internal Medicine

## 2010-06-20 ENCOUNTER — Ambulatory Visit (INDEPENDENT_AMBULATORY_CARE_PROVIDER_SITE_OTHER): Payer: Medicare Other | Admitting: *Deleted

## 2010-06-20 ENCOUNTER — Ambulatory Visit
Admission: RE | Admit: 2010-06-20 | Discharge: 2010-06-20 | Disposition: A | Discharge status: Home / Self Care | Payer: Medicare Other | Source: Ambulatory Visit | Attending: Orthopedic Surgery | Admitting: Orthopedic Surgery

## 2010-06-20 DIAGNOSIS — M4800 Spinal stenosis, site unspecified: Secondary | ICD-10-CM

## 2010-06-20 DIAGNOSIS — Z86718 Personal history of other venous thrombosis and embolism: Secondary | ICD-10-CM

## 2010-06-20 DIAGNOSIS — I4891 Unspecified atrial fibrillation: Secondary | ICD-10-CM

## 2010-06-20 LAB — POCT INR: INR: 1.2

## 2010-06-23 LAB — BASIC METABOLIC PANEL
BUN: 14 mg/dL (ref 6–23)
CO2: 26 mEq/L (ref 19–32)
Chloride: 99 mEq/L (ref 96–112)
Creatinine, Ser: 0.93 mg/dL (ref 0.4–1.5)
Glucose, Bld: 93 mg/dL (ref 70–99)

## 2010-06-23 LAB — CBC
MCHC: 33.8 g/dL (ref 30.0–36.0)
MCV: 98.4 fL (ref 78.0–100.0)
Platelets: 521 10*3/uL — ABNORMAL HIGH (ref 150–400)

## 2010-07-02 ENCOUNTER — Encounter: Payer: Medicare Other | Admitting: *Deleted

## 2010-07-03 ENCOUNTER — Ambulatory Visit (INDEPENDENT_AMBULATORY_CARE_PROVIDER_SITE_OTHER): Payer: Medicare Other | Admitting: *Deleted

## 2010-07-03 DIAGNOSIS — Z86718 Personal history of other venous thrombosis and embolism: Secondary | ICD-10-CM

## 2010-07-03 DIAGNOSIS — I4891 Unspecified atrial fibrillation: Secondary | ICD-10-CM

## 2010-07-03 LAB — POCT INR: INR: 1.4

## 2010-07-12 ENCOUNTER — Emergency Department (HOSPITAL_COMMUNITY)
Admission: EM | Admit: 2010-07-12 | Discharge: 2010-07-12 | Disposition: A | Discharge status: Home / Self Care | Payer: Medicare Other | Attending: Emergency Medicine | Admitting: Emergency Medicine

## 2010-07-12 DIAGNOSIS — J4489 Other specified chronic obstructive pulmonary disease: Secondary | ICD-10-CM | POA: Insufficient documentation

## 2010-07-12 DIAGNOSIS — IMO0002 Reserved for concepts with insufficient information to code with codable children: Secondary | ICD-10-CM | POA: Insufficient documentation

## 2010-07-12 DIAGNOSIS — M79609 Pain in unspecified limb: Secondary | ICD-10-CM | POA: Insufficient documentation

## 2010-07-12 DIAGNOSIS — I4891 Unspecified atrial fibrillation: Secondary | ICD-10-CM | POA: Insufficient documentation

## 2010-07-12 DIAGNOSIS — R109 Unspecified abdominal pain: Secondary | ICD-10-CM | POA: Insufficient documentation

## 2010-07-12 DIAGNOSIS — J449 Chronic obstructive pulmonary disease, unspecified: Secondary | ICD-10-CM | POA: Insufficient documentation

## 2010-07-12 DIAGNOSIS — I10 Essential (primary) hypertension: Secondary | ICD-10-CM | POA: Insufficient documentation

## 2010-07-12 LAB — POCT I-STAT, CHEM 8
BUN: 25 mg/dL — ABNORMAL HIGH (ref 6–23)
Calcium, Ion: 1.06 mmol/L — ABNORMAL LOW (ref 1.12–1.32)
HCT: 36 % — ABNORMAL LOW (ref 39.0–52.0)
TCO2: 26 mmol/L (ref 0–100)

## 2010-07-12 LAB — PROTIME-INR: Prothrombin Time: 21.9 seconds — ABNORMAL HIGH (ref 11.6–15.2)

## 2010-07-15 ENCOUNTER — Encounter: Payer: Self-pay | Admitting: Internal Medicine

## 2010-07-16 ENCOUNTER — Ambulatory Visit (INDEPENDENT_AMBULATORY_CARE_PROVIDER_SITE_OTHER): Payer: Medicare Other | Admitting: Internal Medicine

## 2010-07-16 ENCOUNTER — Encounter: Payer: Self-pay | Admitting: Internal Medicine

## 2010-07-16 VITALS — BP 124/84 | HR 81 | Temp 98.1°F | Wt 273.4 lb

## 2010-07-16 DIAGNOSIS — M48062 Spinal stenosis, lumbar region with neurogenic claudication: Secondary | ICD-10-CM

## 2010-07-16 NOTE — Patient Instructions (Signed)
Assess response to Gabapentin every 8 hrs as needed.

## 2010-07-16 NOTE — Progress Notes (Signed)
  Subjective:    Patient ID: [name] [name], male    DOB: [birth year], 65 y.o.   MRN: [id number]  HPI Extremity pain RLE Onset:6 weeks ago Trigger/injury: possibly due to water exercises  & yardwork. He questions relationship to L  [facility] in 07/2009 ;Dr Charlann Boxer, Surgeon, stated no relationship 05/11.Dr Charlann Boxer referred him to [facility]. Spinal Stenosis diagnosed by Dr Senaida Ores , Orthopedist. ESI scheduled for 05/14. Constitutional: no Fever, chills, sweats, change in weight Musculoskeletal:no Muscle cramping  But sharp   Pain from groin & buttocks to knee.No joint stiffness, redness, or swelling Skin:no  Rash, color change. Neuro:Weakness : can barely  lift RLE. Urinary incontinence  x1.No numbness and tingling, tremor Heme:no Lymphadenopathy, abnormal bruising or clotting. Treatment/response:Dilaudid , Tylenol, Gabapentin 300 mg , Oxycodone from ER 05/04 .   [name] Review of Systems     Objective:   Physical Exam he is in no acute distress at rest. Deep tendon reflexes are normal and equal. He has decreased strength to opposition  In  the right thigh.  He describes pain in the right inguinal area and thigh with elevation of the right leg & with rotation of  the right hip. There is negative straight leg raising. He is able to lie back and sit up without help.  He ambulates slowly and deliberately and uses the furniture for support. He is using a rolling walker.  Pedal pulses are intact. He has no edema, cyanosis, or clubbing.        Assessment & Plan:  #1 spinal stenosis; L1-L2 radicular pattern suggestive  Plan: Most important is  to assess his response to the gabapentin 300 mg every 8 hours.

## 2010-07-18 ENCOUNTER — Encounter: Payer: Medicare Other | Admitting: *Deleted

## 2010-07-18 ENCOUNTER — Ambulatory Visit (INDEPENDENT_AMBULATORY_CARE_PROVIDER_SITE_OTHER): Payer: Medicare Other | Admitting: *Deleted

## 2010-07-18 DIAGNOSIS — Z86718 Personal history of other venous thrombosis and embolism: Secondary | ICD-10-CM

## 2010-07-18 DIAGNOSIS — I4891 Unspecified atrial fibrillation: Secondary | ICD-10-CM

## 2010-07-21 ENCOUNTER — Encounter: Payer: Medicare Other | Admitting: *Deleted

## 2010-07-22 NOTE — Assessment & Plan Note (Signed)
[facility]                            CARDIOLOGY OFFICE NOTE   GRANITE, GODMAN                      MRN:          [id number]  DATE:07/05/2007                            DOB:          11-Sep-1945    PRIMARY CARE PHYSICIAN:  Dr. Alwyn Ren.   REASON FOR PRESENTATION:  Evaluate the patient with atrial fibrillation.   HISTORY OF PRESENT ILLNESS:  The patient is a pleasant 65 year old  gentleman who was hospitalized on April 18 with atrial fibrillation.  His symptoms seem to start abruptly.  He felt very dizzy and lightheaded  that day.  He was weak.  He presented to the hospital where he was found  to have atrial fibrillation.  He had been on Cardizem and had the dose  increased.  He was started on Coumadin.  He was then set to follow-up  with an echocardiogram on an office visit.  He did have the echo which  demonstrated well-preserved ejection fraction.  No significant  abnormalities.  There was some mild focal basal septal hypertrophy.  There were no significant valvular abnormalities, the aortic valve was  slightly calcified.  There was some mild MR.   The patient says he continues to have some palpitations.  He feels it  racing occasionally.  It is not as bad as it was.  He is still fatigued.  Denies any presyncope or syncope.  He does not have any chest  discomfort, neck or arm discomfort.  He does not have any shortness of  breath and is not describing any PND or orthopnea.  He is not  particularly active, though he does his activities of daily living  without limitations.   PAST MEDICAL HISTORY:  1. Atrial fibrillation.  2. Hypertension.  3. Benign prostatic hypertrophy.  4. Back pain.  5. Colonic polyps.  6. Diverticulosis.   PAST SURGICAL HISTORY:  Vasectomy.   ALLERGIES:  PENICILLIN.   MEDICATIONS:  1. Cardizem 20 mg b.i.d.  2. Omeprazole 20 mg daily.  3. Oxycodone p.r.n.  4. Coumadin.   SOCIAL HISTORY:  The patient is  retired.  He is married.  He smoked from  the age of 1 to about 7-1/2 years ago.  For the longest part of this,  he was smoking two packs cigarettes a day, but in the later years only a  pipe.   FAMILY HISTORY:  Remarkable for his father having heart disease at an  early age and dying at 76 with a myocardial infarction and colon cancer.  He had a brother die at age 32 of myocardial function.  He had another  brother in his 31s and 32s with a bypass who eventually died after liver  transplant.   REVIEW OF SYSTEMS:  As stated in the HPI, negative for all other  systems.   PHYSICAL EXAMINATION:  GENERAL:  The patient is in no distress.  VITAL SIGNS:  Blood pressure 126/84, heart 52 and regular.  HEENT:  Eyelids are unremarkable.  Pupils equal, round, reactive to  light, fundi within normal limits, oral mucosa unremarkable.  NECK:  No jugular distention  at 45 degrees.  Carotid upstroke brisk and  symmetrical.  No bruits, thyromegaly.  LYMPHATICS:  No cervical, axillary, inguinal adenopathy.  LUNGS:  Clear to auscultation bilaterally.  BACK:  No costovertebral tenderness.  CHEST:  Unremarkable.  HEART:  PMI not displaced or sustained, S1-S2 within normal limits, no  S3, S4, no clicks, rubs, murmurs.  ABDOMEN:  Obese, positive bowel sounds, normal in frequency and pitch.  No bruits, rebound, guarding or midline pulsatile mass.  No  hepatosplenomegaly, no splenomegaly.  SKIN:  No rashes, no nodules.  EXTREMITIES:  Pulse 2+ throughout, no edema, cyanosis, clubbing.  NEUROLOGICAL:  Oriented to person, place, time.  Cranial nerves II-XII  grossly intact, motor grossly intact.   STUDIES:  EKG sinus rhythm, rate 63, axis within normal.  Intervals  within normal limits, no acute ST-T wave changes.   ASSESSMENT/PLAN:  1. Atrial fibrillation.  The patient is now back in sinus rhythm.  I      am going to have him wear a 48-hour Holter monitor to see if he is      having paroxysms with this.   If so, I might manage this a little      different medically.  Long term, he does not the need Coumadin, and      I will stop this after the monitor.  2. Hypertension.  The patient's blood pressure is currently      controlled.  He will continue on medications as listed.  3. Risk reduction.  The patient has a very strong family history.  He      is not exercising very much.  He does have some dyspnea.  Given      this, he needs screening with exercise treadmill test.  This will      allow me to screen for the low pretest probability of obstructive      coronary disease.  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  This will allow me to risk      stratify and most importantly give him a prescription for exercise.  4. Obesity.  This needs to be addressed with diet and exercise as      above.  I will advise him further on this at time of his POET      (plain old exercise treadmill).     Rollene Rotunda, MD, [facility]  Electronically Signed    JH/MedQ  DD: 07/05/2007  DT: 07/05/2007  Job #: [id number]   cc:   Titus Dubin. Alwyn Ren, MD,FACP,FCCP

## 2010-07-22 NOTE — Discharge Summary (Signed)
NAME:  [name] [name], [name] [name]               ACCOUNT NO.:  [id number]   MEDICAL RECORD NO.:  [id number]          PATIENT TYPE:  INP   LOCATION:  1402                         FACILITY:  [facility]   PHYSICIAN:  Valetta Mole. Swords, MD    DATE OF BIRTH:  [birth year]   DATE OF ADMISSION:  06/25/2007  DATE OF DISCHARGE:  06/26/2007                               DISCHARGE SUMMARY   DISCHARGE DIAGNOSES:  1. Atrial fibrillation.  2. Hypertension.  3. Benign prostatic hypertrophy.  4. Chronic pain syndrome.  5. Colon polyps.  6. History of diverticulosis.  7. Family history of coronary disease.   DISCHARGE MEDICATIONS:  1. Cardizem 240 mg p.o. daily.  2. Warfarin 5 mg p.o. daily.  3. Aspirin 81 mg p.o. daily.  4. Flomax 0.4 mg p.o. daily.  5. Vicodin p.r.n.   HOSPITAL PROCEDURES:  1. EKG:  AFib with rapid ventricular rate.  2. Telemetry monitor demonstrated atrial fibrillation with heart rate      less than 100.  3. TSH normal.   HOSPITAL COURSE:  The patient was admitted to the hospitalist service  with new onset atrial fibrillation associated with orthostatic symptoms.  The patient was treated with increased dose Cardizem.  Heart rate  responded nicely.  At the time of discharge, the patient was ambulating  without difficulty.  No palpitations.  He continues in a sinus rhythm.  He will need an echocardiogram.  I have asked him to follow up with  cardiology.  I sent notes to the manager at the [facility]  office to scheduled an echocardiogram and cardiology office visit.  The  patient will follow up with Dr. Alwyn Ren.   CONDITION ON DISCHARGE:  Improved.   FOLLOWUP PLANS:  Dr. Alwyn Ren, Tuesday Wednesday this week.      Bruce Rexene Edison Swords, MD  Electronically Signed    BHS/MEDQ  D:  06/26/2007  T:  06/26/2007  Job:  [id number]

## 2010-07-22 NOTE — Procedures (Signed)
[facility]                              EXERCISE TREADMILL   SANTOSH, PETTER                      MRN:          [id number]  DATE:07/25/2007                            DOB:          [birth year]    PROCEDURE:  Exercise treadmill test.   INDICATIONS:  Evaluate patient with multiple cardiovascular risk  factors.   PROCEDURAL NOTE:  The patient was exercised using standard Bruce  protocol.  He was exercised for only 6 minutes and 9 seconds.  The test  was terminated because of back pain that radiated down his leg.  This is  his chronic low back problem.  He did have an accelerated blood pressure  response to a maximum of 206/85.  He did not achieve his target heart  rate.  The maximum was 123 which was 78% of predicted.  He achieved only  7.2 mets.  Blood pressure came down nicely with recovery.  There were no  ischemic ST-T wave changes.   CONCLUSION:  Negative inadequate stress test as he did not achieve his  target heart rate.   PLAN:  1. Based on the above, I could not exclude high-grade obstructive      coronary disease though he has no objective findings otherwise and      no symptoms.  He was being screened because of his risk factors and      also to risk stratify and give him prescription for exercise.      Unfortunately, I cannot give him a prescription for a walking      regimen as his back will not tolerate it.  We discussed at length      the need to find some exercise regimen he could tolerate.  He can      swim.  I have encouraged him to join the [facility].  He needs weight      loss and therapeutic lifestyle changes.  2. Hypertension.  Blood pressure response.  Again, I think he could      improve this with a slowly advancing exercise regimen and some      physical training.  I would suggest an outpatient ambulatory blood      pressure monitor in the future if he is successful with weight loss      and exercise to see if he has  better control.  3. The patient does have atrial fibrillation but has not had any      paroxysms of this.  He was taken off his Coumadin because he had      some bleeding in his urine.  I am going to get a 48-hour monitor      today and make sure he has no paroxysms.  I think he is fine to be      off the Coumadin.   Follow-up will be based on results of the above.     Rollene Rotunda, MD, [facility]  Electronically Signed    JH/MedQ  DD: 07/25/2007  DT: 07/25/2007  Job #: [id number]

## 2010-07-25 NOTE — Letter (Signed)
March 22, 2006    Leticia Clas, M.D.  [id number] [place], [place] [address]   RE:  AVYUKT, CIMO  MRN:  [id number]  /  DOB:  04/30/1945   Dear Ree Kida:   Thank you for arranging for your office to see [name] [name] so quickly.   I saw him January 10 complaining of pruritus from the areolar  line up  bilaterally.  There were no definite triggers or relievers.  It was  worse at night.  He is on Cardizem LA 120 mg and omeprazole 20 mg.  There has been no change in the brand or coloration of the pills.   He has had some localized back pain but no significant cardiovascular  symptoms.   PAST MEDICAL HISTORY:  1. Vasectomy.  2. Hospitalization for nausea, vomiting and dizziness.  3. Diverticulosis.  4. Colon polyps.  5. He also has a history of reflux.  6. There is a questionable history of mania.   FAMILY HISTORY:  Positive for coronary artery disease, colon cancer,  COPD, and alcoholism.   He has not smoked since 2002 and rarely drinks beer.   PHYSICAL EXAMINATION:  On exam, he had mild dermatographia over the  trunk.  There were fusiform changes in the PIP joint but no definite  rheumatologic findings.  He had small vascular lesions over the trunk  and face, which blanched with pressure.  He had no organomegaly or  lymphadenopathy.   CBC and differential, sedimentation rate, hepatic profile and rheumatoid  arthritis titer were all normal.   He returned acutely January 14 with question of a staph infection.  He  had raised red lesions over the right neck, which began on January 11 or  12.  The itching had not changed in distribution.   He has a history of staph infection of his face which was rapidly  progressive approximately 40 years ago.   PHYSICAL EXAMINATION:  VITAL SIGNS:  Temperature was 99.2.  LYMPHATIC:  He had tender, swollen right cervical lymph nodes.  SKIN:  There were four raised, erythematous papular lesions over the  neck, the largest of  which was 2.5 x 1.5.  One was 4 cm in length.  The  largest lesion had a central ulcer with an eschar.  The pinna of the  right ear was also erythematous and tender.  HEENT:  There was full extraocular motion.  No facial plethora or edema  was noted.  The oropharynx revealed no significant edema, either.   All of the lesions were unilateral.  There were no vesicles, and his  underlying illness has been bilateral and pruritic.  I question  cellulitis secondary to  scratching  urticarial lesions.  He is placed  on Biaxin orally and Bactroban.   He had been prescribed OTC Zyrtec but was unable to obtain this.  I  placed him on Xyzal to be taken daily for itching.  He also was to  continue with ranitidine 300 mg at bedtime, which might also help  itching as it is an H2 blocker.   Again, I appreciate your kindness in seeing this gentleman.    Sincerely,      [name] [name]. [name] Ren, MD,FACP,FCCP  Electronically Signed    WFH/MedQ  DD: 03/23/2006  DT: 03/23/2006  Job #: [id number]

## 2010-07-25 NOTE — Procedures (Signed)
[facility]  Patient:    [name] [name], [name] [name]                        MRN: [id number] Proc. Date: 08/28/99 Adm. Date:  [id number] Disc. Date: [id number] Attending:  Mingo Amber CC:         Sheppard Plumber. Earlene Plater, M.D.                           Procedure Report  PROCEDURE:  Video colonoscopy.  ENDOSCOPIST:  Roosvelt Harps, M.D.  INDICATIONS:  [age]-year-old male with prior history of colonic polyps and family history of colon cancer.  PREPARATION:  He is n.p.o. since midnight, having taken Phospho-Soda prep and a clear liquid diet.  The mucosa is clean.  DEPTH OF INSERTION:  Cecum.  PREPROCEDURE SEDATION:  He received 60 mg of Demerol and 6 mg of Versed intravenously.  In addition, he was on 2 L nasal cannula O2.  DESCRIPTION OF PROCEDURE:  The Olympus video colonoscope was inserted via the rectum and advanced easily to the ascending colon.  At this point, extra abdominal pressure was required to facilitate passage to the cecum.  Cecal landmarks were identified and photographed.  On withdrawal, the mucosa was carefully evaluated.  Two diminutive ascending colon polyps were noted and removed without electro-snare cautery.  In the hepatic flexure was a 1.5-cm polyp and a second diminutive polyp, which were both similarly removed.  In the mid-transverse colon, was a 1-cm polyp, also removed.  There was no noted post-polypectomy bleeding.  All specimens were recovered for histologic evaluation.  The remainder of the left side of the colon and retroflexed view of the rectum were unremarkable.  Patient tolerated the procedure well. Pulse, blood pressure and oximetry testing were stable throughout.  He was observed in recovery for 45 minutes and discharged home, alert, with a benign abdomen.  IMPRESSION:  Five small right-sided colonic polyps.  PLAN:  Patient will receive a letter advising him of the polyp histology. Since undoubtedly some of them are  adenomatous, he should have a repeat colonoscopy in three years.  He should take no aspirin for the next five days and notify me if he has any problems. DD:  08/28/99 TD:  09/01/99 Job: [id number]

## 2010-07-25 NOTE — Discharge Summary (Signed)
[place]  Patient:    [name] [name], [name] [name]                        MRN: [id number] Adm. Date:  [id number] Disc. Date: [id number] Attending:  Angelena Sole. Dictator:   Cornell Barman, P.A. CC:         Angelena Sole, M.D. [facility]   Discharge Summary  DISCHARGE DIAGNOSIS:  Asthmatic bronchitis.  HISTORY OF PRESENT ILLNESS:  [name] is a 65 year old white male who was seen in the office on Jul 16, 2000, with bronchitis.  He had had bronchitic symptoms and shortness of breath for approximately four weeks prior to this admission.  Unfortunately, the patient failed outpatient albuterol, Avelox, and prednisone treatment.  He returned to the office on Jul 20, 2000, with persistent shortness of breath.  PAST MEDICAL HISTORY: 1. Hypertension. 2. Smoker; he smokes a pipe. 3. Status post vasectomy.  HOSPITAL COURSE: #1 - PULMONARY, PERSISTENT DYSPNEA:  The patients O2 saturations remained greater than 96% on room air.  He was admitted and had a CT of his chest and lower extremity.  This was negative for pulmonary embolus or DVT.  A CT of the chest was consistent with bronchitis or asthma.  A CT of the sinuses revealed mucosal thickening of the ethmoid air cells, otherwise normal.  Cardiac enzymes were negative x 2, and EKG was without any arrhythmias.  The patients condition seemed to have improved.  There was no indication for further inpatient treatment.  The patient will be discharged to complete his outpatient medications and has been instructed to follow up with Dr. Ruthine Dose in two to three weeks, sooner if his dyspnea does not improve.  At that time, he may want to consider either pulmonary function tests or an official cardiac evaluation.  Do note that the patient just recently quit smoking his pipe this week, and his persistent dyspnea may be secondary to an underlying COPD.  DISCHARGE LABORATORY DATA:  CMET was normal.  CBC with differential  normal. Cardiac enzymes were negative.  DISCHARGE MEDICATIONS: 1. Guaifenesin 600 mg 2 tablets b.i.d. for five days. 2. Avelox to complete his regimen. 3. Prednisone to complete as instructed. DD:  07/21/00 TD:  07/21/00 Job: [id number]

## 2010-07-25 NOTE — Letter (Signed)
December 15, 2007    To Whom It May Concern   RE:  [name], [name]  MRN:  [id number]  /  DOB:  Apr 12, 1945   Dear Doctor,   I am sending this note as a letter of clearance.  I have seen [name] for a followup of atrial fibrillation.  I have not seen any  paroxysms of this and he has been in sinus rhythm.  He is not on  Coumadin any longer.  He was on this medicine for a while.  He does have  multiple cardiovascular risk factors and I sent him for an exercise  treadmill test.  This demonstrated no high-risk features.  He did have  slightly hypertensive blood pressure response.  This was submaximal  test, as he did not achieve his target heart rate.  However, with the  absence of high-risk findings with a reasonable functional level as  demonstrated above achieving greater than 5 METS, the patient is at  acceptable risk for planned surgery without further cardiovascular  testing according to ACC/AHA guidelines.  Thank you for your attention  to this matter.    Sincerely,      Rollene Rotunda, MD, [facility]  Electronically Signed    JH/MedQ  DD: 12/15/2007  DT: 12/16/2007  Job #: [id number]   CC:    Titus Dubin. Alwyn Ren, MD,FACP,FCCP

## 2010-07-25 NOTE — Discharge Summary (Signed)
NAME:  [name] [name], [name] [name] NO.:  [id number]   MEDICAL RECORD NO.:  [id number]                   PATIENT TYPE:  OBV   LOCATION:  5506                                 FACILITY:  [facility]   PHYSICIAN:  Rene Paci, M.D. [facility]          DATE OF BIRTH:  10-04-1945   DATE OF ADMISSION:  05/31/2002  DATE OF DISCHARGE:  06/01/2002                                 DISCHARGE SUMMARY   DISCHARGE DIAGNOSES:  1. Anorexia.  2. Weight loss.  3. Nausea.  4. Weakness.  5. Fatigue.  6. Hyponatremia.  7. Mild renal insufficiency.   HISTORY OF PRESENT ILLNESS:  The patient is a 65 year old white male who  presents with a two-week history of anorexia.  The patient states this  started approximately four months ago.  He started seeing a psychiatrist for  anger management.  This was at the advice of another person who helped with  the patient's marriage.  During the course with the psychiatrist there was a  diagnosis of mania made.  He was tried on many medications, of which he does  not recall the names of.  Specifically, he was started on lithium  approximately three weeks prior to this admission.  After a lithium level  was checked, his dose was increased from his starting dose to a dose of 300  mg b.i.d.  He subsequently developed nausea and anorexia.  He was evaluated  in the  [facility].  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  His lithium was discontinued.  This was approximately two weeks ago.  Since  that time, he has been seen by Titus Dubin. Alwyn Ren, M.D., in the office.  He  was seen on Friday, May 26, 2002, and Monday, May 29, 2002.  Laboratory  work was drawn on Monday, May 29, 2002, including a CMET, CBC, TSH, and a  24-hour urine for heavy metals.  The patient's CMET revealed his SGPT to be  elevated at 52, SGOT to be elevated at 58, and total bilirubin to be  elevated at 1.8.  He was also noted to have a platelet count of 529, an MCV  of  95.9, and a hemoglobin of 17.  The BUN was 19 and the creatinine was 1.7.  The TSH was normal.  The patient states that for the past two weeks he has  not eaten anything of substance.  He states that he has a rancid taste in  his mouth.  He does describe some nausea.  He denies any abdominal pain,  fevers, or chills.  He does state that he did have some postprandial emesis  about a week ago.  He also had five to six watery stools for three to four  days about a week ago.  He denies any hematemesis, coffee ground emesis,  melena, or hematochezia.  He denies any history of hepatitis, pancreatitis,  cholecystitis, or diverticulitis.  PAST MEDICAL HISTORY:  1. Asthmatic bronchitis.  2. Hypertension.  3. Gastrointestinal reflux disease.  4. History of colon polyps, status post colonoscopy.  The last colonoscopy     was three years ago by Althea Grimmer. Santogade, M.D.  5. History of negative treadmill test several years ago.   HOSPITAL COURSE:  #1 - GASTROINTESTINAL:  The patient presented with  anorexia with very minimally elevated LFTs.  Ultrasound in the ER revealed  gallbladder sludge.  Amylase was normal.  Lipase was mildly elevated.  We  did admit the patient for IV fluids so that we could do a CT of the abdomen  and pelvis, as well as chest.  CTs of abdomen, pelvis, and chest were all  negative.  A repeat CMET on June 01, 2002, was normal, except for a total  bilirubin of 1.3.  In order words, his LFTs have normalized.  Given the  recent elevated LFTs, we did check a ferritin level, which was mildly  elevated and not diagnostic for hematochromatosis.  Hepatitis serologies are  also still pending.  Given the constellation of his symptoms, including  anorexia, weight loss, nausea, fatigue, weakness, and plus or minus  hyperpigmentation, we obtained a cortisol ACTH to rule out adrenal  insufficiency.  Currently these studies are still pending and they can be  further evaluated as an  outpatient.  A 24-hour urine for heavy metals was  negative for blood, mercury, or arsenic above normal acceptable levels.   #2 - WEIGHT LOSS:  As noted, the patient has had a 33-pound weight loss, he  states secondary to his anorexia.  We were not overly concerned with  malignancy, but CT of the abdomen, pelvis, and chest did rule out any  evidence of masses or adenopathy.  We also noted that his prealbumin was  normal at 24.6, indicating no depletion of his nutritional stores at this  time.   #3 - COLD INTOLERANCE:  We noted that his TSH was normal.   #4 - PULMONARY:  The patient does describe dyspnea, although he oxygenates  well.  He probably has underlying COPD and is currently being treated for  asthmatic bronchitis.  CT of the chest was negative for pulmonary embolus,  which was another consideration.   DISCHARGE LABORATORY DATA:  Prealbumin 24.6.  Ferritin 420.  Potassium 3.2,  BUN 22, creatinine 1.3, total bilirubin 1.3.  Lipase 74, amylase 97.  Hepatitis serologies ACTH cortisol are pending.  Urine for heavy metals was  negative.  Hemoglobin 16.1, platelet count 436, white count 11.8, MCV 95.9.   DISCHARGE MEDICATIONS:  He has been instructed to continue his Prevacid 30  mg daily and Cardizem 120 mg daily.   DIET:  He has been instructed to try a bland diet.   FOLLOW-UP:  We have also instructed the patient to follow up with Titus Dubin.  Alwyn Ren, M.D., on Monday to review his cortisol and ACTH work-up and to  further work up adrenal insufficiency if indicated.     Cornell Barman, P.A. [facility]                  Rene Paci, M.D. [facility]    LC/MEDQ  D:  06/01/2002  T:  06/02/2002  Job:  [id number]   cc:   Titus Dubin. Alwyn Ren, M.D. [facility]

## 2010-07-29 ENCOUNTER — Ambulatory Visit (INDEPENDENT_AMBULATORY_CARE_PROVIDER_SITE_OTHER): Payer: Medicare Other | Admitting: *Deleted

## 2010-07-29 DIAGNOSIS — Z86718 Personal history of other venous thrombosis and embolism: Secondary | ICD-10-CM

## 2010-07-29 DIAGNOSIS — I4891 Unspecified atrial fibrillation: Secondary | ICD-10-CM

## 2010-07-29 LAB — POCT INR: INR: 1.9

## 2010-07-31 ENCOUNTER — Ambulatory Visit: Payer: Medicare Other | Admitting: Internal Medicine

## 2010-07-31 DIAGNOSIS — Z0289 Encounter for other administrative examinations: Secondary | ICD-10-CM

## 2010-08-08 ENCOUNTER — Ambulatory Visit: Payer: Medicare Other | Admitting: Cardiology

## 2010-08-12 ENCOUNTER — Ambulatory Visit (INDEPENDENT_AMBULATORY_CARE_PROVIDER_SITE_OTHER): Payer: Medicare Other | Admitting: *Deleted

## 2010-08-12 DIAGNOSIS — Z86718 Personal history of other venous thrombosis and embolism: Secondary | ICD-10-CM

## 2010-08-12 DIAGNOSIS — I4891 Unspecified atrial fibrillation: Secondary | ICD-10-CM

## 2010-08-22 ENCOUNTER — Telehealth: Payer: Self-pay | Admitting: Cardiology

## 2010-08-22 NOTE — Telephone Encounter (Signed)
7-17 pt has surgery hip replacement, said she has surgical clearance 7-13 with dr hochrein and thinks it should be sooner in case he needs a stress test -

## 2010-08-22 NOTE — Telephone Encounter (Signed)
Dr Antoine Poche and Pam--ms Salameh calling to say dr owen([facility]) suggested that mr [name] [name] see you sooner for c. clearance for his hip replacement  In case we will need to schudule more testing such as stress test --pt surgery on 7/17 and he is scheduled to see you on 7/13--advised we would have enough time to do any testing necessary and if dr hochrein thinks  Mr [name] [name] should be seen sooner we will call--also advised i would pass message along to pam and dr hochrein--pt's wife agrees--nt

## 2010-08-23 NOTE — Telephone Encounter (Signed)
We will move his appt up.

## 2010-08-25 NOTE — Telephone Encounter (Signed)
PT GIVEN AN APPT FOR Friday 6/22 AT 9:30 AM.  WIFE IS AWARE OF APPT DATE AND TIME.

## 2010-08-27 ENCOUNTER — Telehealth: Payer: Self-pay | Admitting: Cardiology

## 2010-08-27 NOTE — Telephone Encounter (Signed)
Spoke with wife who questions if it is OK for pt to eat bananas while taking coumadin. Discussed with pt foods that would interfere with coumadin.  She states understanding.  appt has an appointment with Dr Antoine Poche on Friday for surgical clearance and a follow up CC appt on 09/02/10.  She will call back with any further questions.

## 2010-08-27 NOTE — Telephone Encounter (Signed)
Has question regarding her hubsand eating bananas.

## 2010-08-29 ENCOUNTER — Ambulatory Visit (INDEPENDENT_AMBULATORY_CARE_PROVIDER_SITE_OTHER): Payer: Medicare Other | Admitting: Cardiology

## 2010-08-29 ENCOUNTER — Encounter: Payer: Self-pay | Admitting: Cardiology

## 2010-08-29 DIAGNOSIS — I10 Essential (primary) hypertension: Secondary | ICD-10-CM

## 2010-08-29 DIAGNOSIS — I4891 Unspecified atrial fibrillation: Secondary | ICD-10-CM

## 2010-08-29 DIAGNOSIS — Z01810 Encounter for preprocedural cardiovascular examination: Secondary | ICD-10-CM

## 2010-08-29 DIAGNOSIS — Z452 Encounter for adjustment and management of vascular access device: Secondary | ICD-10-CM | POA: Insufficient documentation

## 2010-08-29 NOTE — Assessment & Plan Note (Signed)
Hopefully he will start to exercise and lose weight after the operation.

## 2010-08-29 NOTE — Assessment & Plan Note (Signed)
The patient will be at acceptable risk for the planned surgery. No further cardiovascular testing is suggested. I reviewed his stress test from [facility] June 2010. This was a stress echocardiogram with no ischemia and a normal EF. He has had no new symptoms. He can stop his Coumadin 5 days before surgery and resume per Dr. Charlann Boxer afterward.

## 2010-08-29 NOTE — Progress Notes (Signed)
HPI The patient is doing well and returns for preoperative evaluation. He is due to have right hip surgery. Since I last saw him he has had no new cardiovascular complaints. He is able to do some water exercises but is quite limited by back pain. With his activities he's not having any chest pressure, neck or arm discomfort. He has not had any palpitations, presyncope or syncope. He has had no PND or orthopnea. He has had no new swelling. I reviewed the stress test that he had in [place] as described below. He's had no symptoms since his negative study. He achieved greater than 5 METS of activity routinely.  Allergies  Allergen Reactions  . Penicillins Anaphylaxis         Current Outpatient Prescriptions  Medication Sig Dispense Refill  . diazepam (VALIUM) 5 MG tablet Take 5 mg by mouth every 6 (six) hours as needed.        . diltiazem (DILACOR XR) 240 MG 24 hr capsule Take 240 mg by mouth daily.        . fluticasone (FLOVENT DISKUS) 50 MCG/BLIST diskus inhaler Inhale 1 puff into the lungs 2 (two) times daily.        [name] gabapentin (NEURONTIN) 300 MG capsule Take 300 mg by mouth 3 (three) times daily.        . metoprolol succinate (TOPROL-XL) 25 MG 24 hr tablet Take 25 mg by mouth daily.        . Mometasone Furo-Formoterol Fum (DULERA) 200-5 MCG/ACT AERO Inhale into the lungs every 12 (twelve) hours. Gargle and spit after use.       [name] omeprazole (PRILOSEC) 20 MG capsule Take 20 mg by mouth daily.        [name] oxyCODONE-acetaminophen (PERCOCET) 10-325 MG per tablet Take 1 tablet by mouth every 6 (six) hours as needed.        . sertraline (ZOLOFT) 50 MG tablet Take 1 tablet (50 mg total) by mouth daily.  90 tablet  1  . warfarin (COUMADIN) 3 MG tablet Take by mouth as directed.        [name] DISCONTD: methocarbamol (ROBAXIN) 500 MG tablet Take 500 mg by mouth as needed.        [name] DISCONTD: montelukast (SINGULAIR) 10 MG tablet Take 10 mg by mouth at bedtime.          Past Medical History  Diagnosis Date   . BPH (benign prostatic hypertrophy)   . Spinal stenosis     congential    Past Surgical History  Procedure Date  . Vasectomy   . Lumbar fusion     ROS:  As stated in the HPI and negative for all other systems.  PHYSICAL EXAM BP 124/88  Pulse 71  Resp 18  Ht 6\' 2"  (1.88 m)  Wt 271 lb (122.925 kg)  BMI 34.79 kg/m2 GENERAL:  Well appearing HEENT:  Pupils equal round and reactive, fundi not visualized, oral mucosa unremarkable NECK:  No jugular venous distention, waveform within normal limits, carotid upstroke brisk and symmetric, no bruits, no thyromegaly LYMPHATICS:  No cervical, inguinal adenopathy LUNGS:  Clear to auscultation bilaterally BACK:  No CVA tenderness CHEST:  Unremarkable HEART:  PMI not displaced or sustained,S1 and S2 within normal limits, no S3, no S4, no clicks, no rubs, no murmurs ABD:  Flat, positive bowel sounds normal in frequency in pitch, no bruits, no rebound, no guarding, no midline pulsatile mass, no hepatomegaly, no splenomegaly, obese EXT:  2 plus pulses throughout,  no edema, no cyanosis no clubbing SKIN:  No rashes no nodules NEURO:  Cranial nerves II through XII grossly intact, motor grossly intact throughout PSYCH:  Cognitively intact, oriented to person place and time  EKG:  Sinus rhythm, rate 71, axis within normal limits, intervals within normal limits, no acute ST-T wave changes.   ASSESSMENT AND PLAN

## 2010-08-29 NOTE — Assessment & Plan Note (Signed)
The blood pressure is at target. No change in medications is indicated. We will continue with therapeutic lifestyle changes (TLC).  

## 2010-08-29 NOTE — Assessment & Plan Note (Signed)
He has had no symptomatic paroxysms. Coumadin will be managed as above.

## 2010-09-02 ENCOUNTER — Ambulatory Visit (INDEPENDENT_AMBULATORY_CARE_PROVIDER_SITE_OTHER): Payer: Medicare Other | Admitting: *Deleted

## 2010-09-02 DIAGNOSIS — I4891 Unspecified atrial fibrillation: Secondary | ICD-10-CM

## 2010-09-02 DIAGNOSIS — Z86718 Personal history of other venous thrombosis and embolism: Secondary | ICD-10-CM

## 2010-09-03 ENCOUNTER — Encounter: Payer: Self-pay | Admitting: Internal Medicine

## 2010-09-03 ENCOUNTER — Ambulatory Visit (INDEPENDENT_AMBULATORY_CARE_PROVIDER_SITE_OTHER): Payer: Medicare Other | Admitting: Internal Medicine

## 2010-09-03 DIAGNOSIS — I10 Essential (primary) hypertension: Secondary | ICD-10-CM

## 2010-09-03 DIAGNOSIS — J45909 Unspecified asthma, uncomplicated: Secondary | ICD-10-CM

## 2010-09-03 DIAGNOSIS — M8700 Idiopathic aseptic necrosis of unspecified bone: Secondary | ICD-10-CM

## 2010-09-03 DIAGNOSIS — Z01818 Encounter for other preprocedural examination: Secondary | ICD-10-CM

## 2010-09-03 DIAGNOSIS — M169 Osteoarthritis of hip, unspecified: Secondary | ICD-10-CM

## 2010-09-03 NOTE — Progress Notes (Signed)
  Subjective:    Patient ID: [name] [name], male    DOB: 01-06-1946, 65 y.o.   MRN: [id number]  HPI Pre op evaluation: Surgical diagnosis:TRH Tentative surgical date/Surgeon:09/23/10 Dr Charlann Boxer Severity:up to 8  Pain: deep aching pain from R groin to knee cap Activity of daily living limitation/impairment of function:difficulty even rolling over in bed; he can't pull long pants due to pain induction Treatment to date, efficacy:S/P steroid injection  with no response  & narcotics with only partial response. Only god response was to Dilaudid in ER 1 mo ago. Significant past medical history:  dyslipidemia; hypertension; AF; asthma/ RAD    Review of Systems Patient reports no  anorexia, weight change, fever ,adenopathy, persistant / recurrent hoarseness, swallowing issues, chest pain,palpitations, edema,persistant / recurrent cough, hemoptysis,  paroxysmal nocturnal dyspnea , gastrointestinal  bleeding (melena, rectal bleeding), abdominal pain, excessive heart burn, GU symptoms( dysuria, hematuria, pyuria, voiding/incontinence  issues) syncope, numbness & tingling, skin/hair/nail changes,depression, anxiety. DOE using walker. Weakness RLE  3-4 mos , since knee symptoms began. No rescue inhaler use in recent past. Prior trigger was mold under house . With reolution of that environmental issue , RAD quiescent     Objective:   Physical Exam     Gen.: Healthy and well-nourished in appearance but uncomfortable. Using rolling walker. Alert, appropriate and cooperative throughout exam. Eyes: No corneal or conjunctival inflammation noted. Neck: No deformities, masses, or tenderness noted. Range of motion &. Thyroid normal. Lungs: Normal respiratory effort; chest expands symmetrically. Lungs are clear to auscultation without rales, wheezes, or increased work of breathing.BS decreased Heart: Normal rate and rhythm. Normal S1 and S2. No gallop, click, or rub. No murmur. Abdomen: Bowel sounds normal; abdomen  soft and nontender. No masses, organomegaly or hernias noted. No clubbing, cyanosis, edema, or deformity noted. Pain with elevation RLE while seated. RLE weak to opposition.Nail health  good. Vascular: Carotid, radial artery, dorsalis pedis and  posterior tibial pulses are full and equal. No bruits present. Neurologic: Alert and oriented x3.  Skin: Intact without suspicious lesions or rashes. Lymph: No cervical, axillary  lymphadenopathy present. Psych: Mood and affect are normal. 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

## 2010-09-03 NOTE — Patient Instructions (Signed)
You are cleared for the surgery medically

## 2010-09-04 ENCOUNTER — Telehealth: Payer: Self-pay | Admitting: Cardiology

## 2010-09-04 NOTE — Telephone Encounter (Signed)
Faxed LOV, EKG & CXR to [facility] ([phone]).

## 2010-09-08 NOTE — H&P (Signed)
NAMEDILLAN, CANDELA NO.:  [id number]  MEDICAL RECORD NO.:  [id number]  LOCATION:  1S                           FACILITY:  [facility]  PHYSICIAN:  Madlyn Frankel. Charlann Boxer, M.D.  DATE OF BIRTH:  10-05-1945  DATE OF ADMISSION:  09/01/2010 DATE OF DISCHARGE:                             HISTORY & PHYSICAL   DATE OF SURGERY:  September 23, 2010.  ADMISSION DIAGNOSIS:  Osteoarthritis, right hip.  HISTORY OF PRESENT ILLNESS:  This is a 65 year old gentleman with a history of previous left total hip arthroplasty who has done very well, who has osteoarthritis in his right hip with failure of conservative treatment.  Due to his continued pain, he is now scheduled for total hip arthroplasty on the right.  The surgery risks, benefits, and aftercare were discussed in detail with the patient.  Questions were invited and answered.  NOTE THAT HE HAS AN ANAPHYLACTIC REACTION TO PENICILLIN.  HE ALSO HAS A HISTORY OF DVT.  He is not a candidate for tranexamic acid and will not receive that at surgery.  He want to go to [facility] postoperatively for rehab.  The surgery risks, benefits, and aftercare were discussed with the patient and surgery will go ahead as scheduled.  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  Current medications: 1. Omeprazole 20 mg daily. 2. Warfarin 3 mg 2 tablets 5 days a week and 1-1/2 tablets 2 days a     week. 3. Diltiazem 240 mg daily. 4. Sertraline 50 mg daily. 5. Gabapentin 300 mg t.i.d. 6. Percocet 5/325 one t.i.d. 7. Valium 5 mg 1 t.i.d. 8. Dulera 200 mcg 1 puff b.i.d. 9. Metoprolol 25 mg p.o. p.r.n. irregular heartbeat with his atrial     fib, but he has not used this since 2010. 10.Also, he is on a prednisone taper 10 mg.  Serious medical illnesses include AFib, COPD, chronic obstructive pulmonary disease, hypertension, history of DVT, and history of gout.  Previous surgeries include back surgery x2 at [facility], left total  hip arthroplasty, bilateral cataracts, and vasectomy.  FAMILY HISTORY:  Positive for COPD, AFib, heart attack, and liver transplant.  SOCIAL HISTORY:  The patient is married.  He is retired.  He drinks rarely and used to smoke 2 packs a day, but has not smoked since 2002.  REVIEW OF SYSTEMS:  CENTRAL NERVOUS SYSTEM:  Positive for history of shingles.  Negative for headache, blurred vision, or dizziness. PULMONARY:  Positive for exertional shortness of breath and history of pneumonia.  CARDIOVASCULAR:  Positive for history of atrial fibrillation.  Negative for chest pain.  GI:  Positive for reflux.  GU: Positive for urinary frequency.  MUSCULOSKELETAL:  Positive for back pain and right hip.  PHYSICAL EXAMINATION:  VITAL SIGNS:  BP 118/78, respirations 16, and pulse 72 and regular. GENERAL APPEARANCE:  This is an obese gentleman in no acute distress. HEENT:  Head normocephalic.  Nose patent.  Ears patent.  Pupils are equal, round, and reactive to light.  Status post cataracts.  Throat without injection. NECK:  Supple without adenopathy.  Carotids are 2+ without bruit. CHEST:  Scattered wheezes. HEART:  Regular rate and rhythm  at 72 beats without murmur. ABDOMEN:  Soft.  Active bowel sounds.  No masses or organomegaly. NEUROLOGIC:  The patient is alert and oriented to time, place, and person.  Cranial nerves II-XII grossly intact. EXTREMITIES:  Pain to LS range of motion and straight-leg raise.  His right hip shows painful range of motion to rotation or flexion with decreased range of motion.  Sensation and circulation are intact.  IMPRESSION:  Right hip osteoarthritis.  PLAN:  Right total hip arthroplasty.     Jaquelyn Bitter. Chabon, P.A.   ______________________________ Madlyn Frankel Charlann Boxer, M.D.    SJC/MEDQ  D:  09/03/2010  T:  09/03/2010  Job:  [id number]  Electronically Signed by Jodene Nam P.A. on 09/04/2010 04:59:37 PM Electronically Signed by Durene Romans M.D. on  09/08/2010 09:29:02 AM

## 2010-09-16 ENCOUNTER — Encounter: Payer: Self-pay | Admitting: Internal Medicine

## 2010-09-16 ENCOUNTER — Ambulatory Visit (INDEPENDENT_AMBULATORY_CARE_PROVIDER_SITE_OTHER): Payer: Medicare Other | Admitting: Internal Medicine

## 2010-09-16 DIAGNOSIS — R05 Cough: Secondary | ICD-10-CM

## 2010-09-16 DIAGNOSIS — J45909 Unspecified asthma, uncomplicated: Secondary | ICD-10-CM

## 2010-09-16 DIAGNOSIS — R059 Cough, unspecified: Secondary | ICD-10-CM

## 2010-09-16 NOTE — Progress Notes (Signed)
  Subjective:    Patient ID: [name] [name], male    DOB: 1945/10/28, 65 y.o.   MRN: [id number]  HPI Cough Onset:2 am today ; he was awakened by pain in his right leg. The cough onset was after the pain and did not wake him up.He experienced  resolution by 4 pm Extrinsic symptoms:itchy eyes, sneezing:no  Infectious symptoms :fever, purulent secretions :no Chest symptoms: pain, sputum production, hemoptysis,wheezing:no. Dyspnea with minimal exertion GI symptoms: Dyspepsia, reflux: He did not have any reflux symptoms prior to the cough. Occupational/environmental exposures:no Smoking:quit 2002 ACE inhibitor:no Treatment/efficacy:fluids Past medical history of asthma ; he did not use his rescue agent. As noted the cough resolved without definitive therapy.    Review of Systems     Objective:   Physical Exam General appearance is of good health and nourishment; no acute distress or increased work of breathing is present.  No  lymphadenopathy about the head, neck, or axilla noted.   Eyes: No conjunctival inflammation or lid edema is present. There is no scleral icterus.  Ears:  External ear exam shows no significant lesions or deformities.  Otoscopic examination reveals clear canals, tympanic membranes are intact bilaterally without bulging, retraction, inflammation or discharge.  Nose:  External nasal examination shows no deformity or inflammation. Nasal mucosa are pink and moist without lesions or exudates. No septal dislocation or dislocation.No obstruction to airflow.   Oral exam: Dental hygiene is good; lips and gums are healthy appearing.There is no oropharyngeal erythema or exudate noted.   Heart:  Normal rate and regular rhythm. S1 and S2 normal without gallop, murmur, click, rub. S4 with slurring Lungs:Chest clear to auscultation; no wheezes, rhonchi,rales ,or rubs present.No increased work of breathing.    Extremities:  No cyanosis, edema, or clubbing  noted    Skin: Warm &  dry w/o jaundice or tenting.          Assessment & Plan:    #1 cough, self-limited  #2 history of asthma  Plan: Topical inhaled steroid prophylactically. A preop chest x-ray will probably be requested by Anesthesiologist  based on his past history.

## 2010-09-16 NOTE — Patient Instructions (Signed)
If the cough recurs &  persists or progresses; please increase the Dulera  to 2 puffs every 12 hours. Gargle and spit after using this.

## 2010-09-17 ENCOUNTER — Other Ambulatory Visit: Payer: Self-pay | Admitting: Orthopedic Surgery

## 2010-09-17 ENCOUNTER — Other Ambulatory Visit (HOSPITAL_COMMUNITY): Payer: Self-pay | Admitting: Orthopedic Surgery

## 2010-09-17 ENCOUNTER — Ambulatory Visit (HOSPITAL_COMMUNITY)
Admission: RE | Admit: 2010-09-17 | Discharge: 2010-09-17 | Disposition: A | Discharge status: Home / Self Care | Payer: Medicare Other | Source: Ambulatory Visit | Attending: Orthopedic Surgery | Admitting: Orthopedic Surgery

## 2010-09-17 ENCOUNTER — Encounter (HOSPITAL_COMMUNITY): Payer: Medicare Other

## 2010-09-17 DIAGNOSIS — J449 Chronic obstructive pulmonary disease, unspecified: Secondary | ICD-10-CM | POA: Insufficient documentation

## 2010-09-17 DIAGNOSIS — J4489 Other specified chronic obstructive pulmonary disease: Secondary | ICD-10-CM | POA: Insufficient documentation

## 2010-09-17 DIAGNOSIS — Z01811 Encounter for preprocedural respiratory examination: Secondary | ICD-10-CM

## 2010-09-17 DIAGNOSIS — Z01818 Encounter for other preprocedural examination: Secondary | ICD-10-CM | POA: Insufficient documentation

## 2010-09-17 DIAGNOSIS — I10 Essential (primary) hypertension: Secondary | ICD-10-CM | POA: Insufficient documentation

## 2010-09-17 DIAGNOSIS — I4891 Unspecified atrial fibrillation: Secondary | ICD-10-CM | POA: Insufficient documentation

## 2010-09-17 DIAGNOSIS — M1610 Unilateral primary osteoarthritis, unspecified hip: Secondary | ICD-10-CM | POA: Insufficient documentation

## 2010-09-17 DIAGNOSIS — M169 Osteoarthritis of hip, unspecified: Secondary | ICD-10-CM | POA: Insufficient documentation

## 2010-09-17 DIAGNOSIS — Z01812 Encounter for preprocedural laboratory examination: Secondary | ICD-10-CM | POA: Insufficient documentation

## 2010-09-17 DIAGNOSIS — F17200 Nicotine dependence, unspecified, uncomplicated: Secondary | ICD-10-CM | POA: Insufficient documentation

## 2010-09-17 LAB — URINALYSIS, ROUTINE W REFLEX MICROSCOPIC
Glucose, UA: NEGATIVE mg/dL
Ketones, ur: NEGATIVE mg/dL
Leukocytes, UA: NEGATIVE
Specific Gravity, Urine: 1.021 (ref 1.005–1.030)
pH: 7.5 (ref 5.0–8.0)

## 2010-09-17 LAB — PROTIME-INR
INR: 1.89 — ABNORMAL HIGH (ref 0.00–1.49)
Prothrombin Time: 22 seconds — ABNORMAL HIGH (ref 11.6–15.2)

## 2010-09-17 LAB — CBC
Hemoglobin: 12.2 g/dL — ABNORMAL LOW (ref 13.0–17.0)
MCH: 32 pg (ref 26.0–34.0)
RBC: 3.81 MIL/uL — ABNORMAL LOW (ref 4.22–5.81)
WBC: 9.4 10*3/uL (ref 4.0–10.5)

## 2010-09-17 LAB — APTT: aPTT: 54 seconds — ABNORMAL HIGH (ref 24–37)

## 2010-09-17 LAB — BASIC METABOLIC PANEL
Calcium: 9.7 mg/dL (ref 8.4–10.5)
Creatinine, Ser: 1.05 mg/dL (ref 0.50–1.35)
GFR calc non Af Amer: >60 mL/min (ref >60)
Sodium: 138 mEq/L (ref 135–145)

## 2010-09-17 LAB — DIFFERENTIAL
Basophils Relative: 0 % (ref 0–1)
Monocytes Relative: 11 % (ref 3–12)
Neutro Abs: 5.2 10*3/uL (ref 1.7–7.7)
Neutrophils Relative %: 55 % (ref 43–77)

## 2010-09-17 LAB — SURGICAL PCR SCREEN
MRSA, PCR: NEGATIVE
Staphylococcus aureus: NEGATIVE

## 2010-09-19 ENCOUNTER — Ambulatory Visit: Payer: Self-pay | Admitting: Cardiology

## 2010-09-22 ENCOUNTER — Ambulatory Visit (INDEPENDENT_AMBULATORY_CARE_PROVIDER_SITE_OTHER): Payer: Medicare Other | Admitting: Internal Medicine

## 2010-09-22 ENCOUNTER — Encounter: Payer: Self-pay | Admitting: Internal Medicine

## 2010-09-22 VITALS — BP 122/78 | HR 83 | Temp 97.9°F | Wt 273.0 lb

## 2010-09-22 DIAGNOSIS — R05 Cough: Secondary | ICD-10-CM

## 2010-09-22 MED ORDER — HYDROCODONE-HOMATROPINE 5-1.5 MG/5ML PO SYRP: 5.0000 mL | ORAL_SOLUTION | Freq: Four times a day (QID) | ORAL | Status: AC | PRN

## 2010-09-22 NOTE — Patient Instructions (Signed)
Drink room  temperature water if you're having coughing. Decrease Dulera to 1 puff every 12 hours; gargle & spit  after use. Employ the incentive spirometer  every 2 hours while awake to improve ventilation of the right lower lobe.

## 2010-09-22 NOTE — Progress Notes (Signed)
  Subjective:    Patient ID: [name] [name], male    DOB: 1945-10-23, 65 y.o.   MRN: [id number]  HPI on 7/14 he had a severe paroxysmal bout of coughing for 20 minutes.He had been using Dulera 2 puffs every 12 hours  . He found leftover Hydromet and 2 doses of this completely resolved the cough.  He denies extrinsic symptoms of itchy eyes, sneezing or wheezing. The cough was nonproductive without purulent secretions or hemoptysis. He denied any chest pain or palpitations. He denies fever, chills, or sweats. He's had no reflux symptoms.  He is  not on an ACE inhibitor.  Preop chest x-ray 09/17/2010 revealed no active process. He did have mild right basilar atelectasis.    Review of Systems     Objective:   Physical Exam General appearance is of good health and nourishment; no acute distress or increased work of breathing is present.  No  lymphadenopathy about the head, neck, or axilla noted.   Eyes: No conjunctival inflammation or lid edema is present. There is no scleral icterus.  Ears:  External ear exam shows no significant lesions or deformities.  Otoscopic examination reveals clear canals, tympanic membranes are intact bilaterally without bulging, retraction, inflammation or discharge.  Nose:  External nasal examination shows no deformity or inflammation. Nasal mucosa are pink and moist without lesions or exudates. No septal dislocation or dislocation.No obstruction to airflow.   Heart:  Normal rate and regular rhythm. S1 and S2 normal without gallop, murmur, click, rub or other extra sounds.   Lungs:Chest clear to auscultation; no wheezes, rhonchi,rales ,or rubs present.No increased work of breathing.    Extremities:  No cyanosis, edema, or clubbing  noted    Skin: Warm & dry w/o jaundice or tenting.         Assessment & Plan:  #1 paroxysmal coughing despite maintenance Her. No clinical evidence of active reactive airways disease (asthmatic bronchitis)  #2 mild right  basilar atelectasis  Plan: Elwin Sleight will be decreased to one puff every 12 hours. He is to gargle and spit after use  Hydromet will be renewed  He has an incentive spirometer and has been asked to start using this at least every 2 hours while awake.

## 2010-09-23 ENCOUNTER — Inpatient Hospital Stay (HOSPITAL_COMMUNITY): Payer: Medicare Other

## 2010-09-23 ENCOUNTER — Inpatient Hospital Stay (HOSPITAL_COMMUNITY)
Admission: RE | Admit: 2010-09-23 | Discharge: 2010-09-26 | DRG: 470 | Disposition: A | Discharge status: Skilled Nursing Facility | Payer: Medicare Other | Source: Ambulatory Visit | Attending: Orthopedic Surgery | Admitting: Orthopedic Surgery

## 2010-09-23 DIAGNOSIS — M8970 Major osseous defect, unspecified site: Secondary | ICD-10-CM | POA: Diagnosis present

## 2010-09-23 DIAGNOSIS — J449 Chronic obstructive pulmonary disease, unspecified: Secondary | ICD-10-CM | POA: Diagnosis present

## 2010-09-23 DIAGNOSIS — I4891 Unspecified atrial fibrillation: Secondary | ICD-10-CM | POA: Diagnosis present

## 2010-09-23 DIAGNOSIS — Z01812 Encounter for preprocedural laboratory examination: Secondary | ICD-10-CM

## 2010-09-23 DIAGNOSIS — Z981 Arthrodesis status: Secondary | ICD-10-CM

## 2010-09-23 DIAGNOSIS — M87059 Idiopathic aseptic necrosis of unspecified femur: Principal | ICD-10-CM | POA: Diagnosis present

## 2010-09-23 DIAGNOSIS — Z880 Allergy status to penicillin: Secondary | ICD-10-CM

## 2010-09-23 DIAGNOSIS — J4489 Other specified chronic obstructive pulmonary disease: Secondary | ICD-10-CM | POA: Diagnosis present

## 2010-09-23 DIAGNOSIS — I10 Essential (primary) hypertension: Secondary | ICD-10-CM | POA: Diagnosis present

## 2010-09-23 DIAGNOSIS — Z86718 Personal history of other venous thrombosis and embolism: Secondary | ICD-10-CM

## 2010-09-23 LAB — APTT: aPTT: 38 seconds — ABNORMAL HIGH (ref 24–37)

## 2010-09-24 LAB — CBC
HCT: 29.6 % — ABNORMAL LOW (ref 39.0–52.0)
Hemoglobin: 9.8 g/dL — ABNORMAL LOW (ref 13.0–17.0)
RBC: 2.95 MIL/uL — ABNORMAL LOW (ref 4.22–5.81)
RDW: 14.7 % (ref 11.5–15.5)
WBC: 14.8 10*3/uL — ABNORMAL HIGH (ref 4.0–10.5)

## 2010-09-24 LAB — BASIC METABOLIC PANEL
BUN: 11 mg/dL (ref 6–23)
Chloride: 100 mEq/L (ref 96–112)
GFR calc Af Amer: >60 mL/min (ref >60)
Potassium: 4.3 mEq/L (ref 3.5–5.1)

## 2010-09-24 LAB — POCT I-STAT 4, (NA,K, GLUC, HGB,HCT)
Glucose, Bld: 103 mg/dL — ABNORMAL HIGH (ref 70–99)
HCT: 31 % — ABNORMAL LOW (ref 39.0–52.0)
Sodium: 139 mEq/L (ref 135–145)

## 2010-09-24 LAB — PROTIME-INR: INR: 1.34 (ref 0.00–1.49)

## 2010-09-25 LAB — BASIC METABOLIC PANEL
BUN: 11 mg/dL (ref 6–23)
CO2: 30 mEq/L (ref 19–32)
Calcium: 8.4 mg/dL (ref 8.4–10.5)
Creatinine, Ser: 0.91 mg/dL (ref 0.50–1.35)
Glucose, Bld: 110 mg/dL — ABNORMAL HIGH (ref 70–99)

## 2010-09-25 LAB — CBC
HCT: 29.1 % — ABNORMAL LOW (ref 39.0–52.0)
MCHC: 33 g/dL (ref 30.0–36.0)
MCV: 100.3 fL — ABNORMAL HIGH (ref 78.0–100.0)
RDW: 14.5 % (ref 11.5–15.5)

## 2010-09-25 LAB — URINALYSIS, ROUTINE W REFLEX MICROSCOPIC
Glucose, UA: NEGATIVE mg/dL
Hgb urine dipstick: NEGATIVE
Ketones, ur: NEGATIVE mg/dL
Protein, ur: NEGATIVE mg/dL

## 2010-09-26 LAB — URINE CULTURE
Culture  Setup Time: 201207200118
Culture: NO GROWTH
Special Requests: NEGATIVE

## 2010-09-29 NOTE — Op Note (Signed)
[name] [name], [name] [name] NO.:  [id number]  MEDICAL RECORD NO.:  [id number]  LOCATION:  1616                         FACILITY:  [facility]  PHYSICIAN:  Madlyn Frankel. Charlann Boxer, M.D.  DATE OF BIRTH:  Sep 14, 1945  DATE OF PROCEDURE:  09/23/2010 DATE OF DISCHARGE:                              OPERATIVE REPORT   PREOPERATIVE DIAGNOSIS:  Right hip avascular necrosis.  POSTOPERATIVE DIAGNOSIS:  Right hip avascular necrosis.  PROCEDURE PERFORMED:  Right total hip replacement.  COMPONENTS USED:  DePuy hip system, size 56 Pinnacle cup, 36 neutral AltrX liner, size 10 high-trial lock stem with a 36 +5 Delta ceramic ball.  SURGEON:  Madlyn Frankel. Charlann Boxer, M.D.  ASSISTANT:  Jaquelyn Bitter. Chabon, P.A.  ANESTHESIA:  General.  SPECIMENS:  None.  COMPLICATIONS:  None.  DRAINS:  One Hemovac.  ESTIMATED BLOOD LOSS:  About 800 mL.  INDICATION FOR THE PROCEDURE:  [name] is a 65 year old patient of mine with previous left total hip replacement with significant lumbar fusion.  He had presented with relatively atypical presentation of hip discomfort; however, further workup in ruling out any significant lumbar involvement led to the diagnosis of avascular necrosis with progressive collapse.  Given the persistent and increasing severity of the symptoms as well as these radiographic findings of collapse, he was scheduled for right hip replacement as he had this on the left side.  Risks and benefits reviewed regarding the surgery.  He is ready to proceed at any moments noticed.  Consent was obtained for benefit of pain relief.  PROCEDURE IN DETAIL:  Patient was brought to the operative theater. Once adequate anesthesia, preoperative antibiotics, clindamycin administered, patient was positioned into the left lateral decubitus position with right side up.  In this position on the table, was evaluated and noted for leg length comparison.  The right lower extremity was then prepped and draped  in a sterile fashion.  Time-out was performed identifying the patient, planned procedure and the extremity.  Lateral incision was made based off the trochanter.  Sharp dissection was carried down to the iliotibial band and gluteal fascia, which was then incised for posterior approach.  The gluteus maximus was split.  The posterior short external rotators were taken down separating the posterior capsule.  An L capsulotomy was made preserving the capsular leaflet for later anatomic repair.  Hip was dislocated and neck osteotomy made into the trochanteric fossa.  Severe avascular changes with significant fragmentation of femoral head was encountered.  Retractors were placed for femoral preparation.  The proximal femur was opened.  The starting drill hand was removed and we supplemented irrigation to prevent fat emboli.  The proximal hip was irrigated.  I began broaching with a one broach and then skipped by two's until I got to a seven broach.  I then broached by one's.  This was displayed over the size 10 broach on the contralateral hip.  At this point, I packed off the femur and now attended to the acetabulum.  Acetabular retractors were placed.  Remaining labrum and soft tissue debris including osteophytes.  I began reaming with a 48 reamer and reamed up to 55 reamer and essentially we have done on  the contralateral hip and then packed it in the 56 Pinnacle cup and was positioned in about 35 degrees of abduction and 20 degrees of forward flexion.  Single cancellous screw was used to support the hip.  The final 36 neutral AltrX liner was impacted.  At this point, the trial reduction now carried out initially with the nine broach sitting at the level of the initial neck cut with the high offset neck and a +5 ball.  There was a prior 0.45 mm shuck at this point.  The hip was stable throughout the range of measurement and there was no evidence of any impingement.  His leg lengths were  little bit short.  For this reason, we chose the 10 high-offset trial lock stem, which we had used on the contralateral hip.  I removed the trial nine broach and broached with a 10.  This sat a bit proud of the neck cut as to expect the final 10 trial lock stem was then impacted and sat at the level where the broach was.  I did go ahead and trialed again and chose the +5 ball similar to the contralateral hip.  The hip was reduced.  Hip was very stable.  Leg lengths appeared to be equal.  Shuck was limited down to about 1-2 mm.  Given all these parameters, the hip was irrigated again.  The posterior capsule was reapproximated to the superior leaflet using #1 Vicryl.  The remainder of the hip was closed over a medium Hemovac drain using #1 Vicryl on the iliotibial band and gluteal fascia 2-0 Vicryl in the subcu layer and running 4-0 Monocryl.  The hip was cleaned, dried and dressed sterilely using Dermabond and Aquacel dressing.  Drain site dressed separately.  The patient was then brought to the recovery room in stable condition tolerating the procedure well.     Madlyn Frankel Charlann Boxer, M.D.     MDO/MEDQ  D:  09/23/2010  T:  09/23/2010  Job:  [id number]  Electronically Signed by Durene Romans M.D. on 09/29/2010 10:14:49 AM

## 2010-09-30 ENCOUNTER — Other Ambulatory Visit: Payer: Self-pay | Admitting: Internal Medicine

## 2010-10-11 NOTE — Discharge Summary (Signed)
  NAMEZEESHAN, KORTE NO.:  [id number]  MEDICAL RECORD NO.:  [id number]  LOCATION:  1616                         FACILITY:  [facility]  PHYSICIAN:  Madlyn Frankel. Charlann Boxer, M.D.  DATE OF BIRTH:  17-Sep-1945  DATE OF ADMISSION:  09/23/2010 DATE OF DISCHARGE:  09/26/2010                        DISCHARGE SUMMARY - REFERRING   PROCEDURE:  Right total hip arthroplasty.  ADMITTING DIAGNOSIS:  Right hip avascular necrosis.  DISCHARGE DIAGNOSES: 1. Atrial fibrillation. 2. Chronic obstructive pulmonary disease. 3. Hypertension. 4. History of deep vein thrombosis. 5. History of gout. 6. Status post right total hip arthroplasty.  HISTORY OF PRESENT ILLNESS:  The patient is a 65 year old gentleman with history of previous left total hip arthroplasty who has done very well. The patient does have history of osteoarthritis, avascular necrosis of right hip and due to failure of conservative treatment and continuation of the pain wishes to undergo a right total hip arthroplasty.  Risks, benefits, expectations and aftercare were discussed in detail with the patient.  He understands and wishes to proceed.  The patient underwent the above-stated procedure on September 23, 2010.  The patient tolerated the procedure well, was brought to the recovery room in good condition, subsequently to the floor.  During the patient's stay, he stayed afebrile and vital signs were stable.  His labs and electrolytes stayed pretty regular throughout the visit as well.  The patient's pain has gradually got better.  It was felt the patient was doing well enough and the patient himself feels well enough to be discharged to skilled nursing facility today on September 26, 2010.  DISCHARGE:  The patient will be discharged to skilled nursing facility today, September 26, 2010.  The patient has dressing in place, will stay on for about 7 to 8 days.  Then change to gauze and tape.  The patient will follow up with Dr. Charlann Boxer at  [facility] in 2 weeks.  The patient already has appointment.  The patient is going to be weightbearing as tolerated.  DISCHARGE MEDICATIONS: 1. Colace 100 mg 1 p.o. b.i.d. 2. Norco 7.5/325 mg one to two p.o. q.4-6 h. p.r.n. pain. 3. Robaxin 500 mg 1 p.o. q.6 h. p.r.n. 4. MiraLax 17 g 1 p.o. b.i.d. 5. Colace 100 mg 1 p.o. daily as needed. 6. Coumadin 3 mg, 1.5 tabs on Monday and Friday, then 2 tabs on     Saturday, Sunday, Tuesday, Wednesday and Thursday. 7. Diltiazem 240 mg 1 p.o. q.h.s. 8. Dulera 200 mcg 2 puffs inhaled b.i.d. 9. Metoprolol 25 mg 1 p.o. daily p.r.n. 10.Gabapentin 300 mg 1 p.o. t.i.d. 11.Omeprazole 20 mg 1 p.o. daily at bedtime. 12.Valium 5 mg 1 p.o. q.8 h p.r.n. 13.Zoloft 50 mg 1 p.o. q.h.s. 14.    ______________________________ Lanney Gins, PA   ______________________________ Madlyn Frankel. Charlann Boxer, M.D.    MB/MEDQ  D:  09/26/2010  T:  09/26/2010  Job:  [id number]  Electronically Signed by Lanney Gins PA on 10/08/2010 01:38:43 PM Electronically Signed by Durene Romans M.D. on 10/11/2010 09:17:30 AM

## 2010-10-23 ENCOUNTER — Other Ambulatory Visit: Payer: Self-pay | Admitting: Cardiology

## 2010-10-28 ENCOUNTER — Ambulatory Visit (INDEPENDENT_AMBULATORY_CARE_PROVIDER_SITE_OTHER): Payer: Medicare Other | Admitting: Internal Medicine

## 2010-10-28 DIAGNOSIS — I4891 Unspecified atrial fibrillation: Secondary | ICD-10-CM

## 2010-10-28 DIAGNOSIS — Z7901 Long term (current) use of anticoagulants: Secondary | ICD-10-CM

## 2010-10-29 ENCOUNTER — Other Ambulatory Visit: Payer: Self-pay | Admitting: Internal Medicine

## 2010-10-29 MED ORDER — SERTRALINE HCL 50 MG PO TABS: 50.0000 mg | ORAL_TABLET | Freq: Every day | ORAL | Status: DC

## 2010-10-29 NOTE — Telephone Encounter (Signed)
OK X 3 months 

## 2010-10-29 NOTE — Telephone Encounter (Signed)
RX sent to pharmacy  

## 2010-10-29 NOTE — Telephone Encounter (Signed)
Pt wife called says sertraline was increased for 75mg  while in rehab recovering from surgery now since pt has been discharged will no need new rx to be called into pharmacy. Ok to refill?

## 2010-10-30 ENCOUNTER — Ambulatory Visit (INDEPENDENT_AMBULATORY_CARE_PROVIDER_SITE_OTHER): Payer: Self-pay | Admitting: Internal Medicine

## 2010-10-30 DIAGNOSIS — R0989 Other specified symptoms and signs involving the circulatory and respiratory systems: Secondary | ICD-10-CM

## 2010-10-30 MED ORDER — SERTRALINE HCL 50 MG PO TABS: ORAL_TABLET | ORAL | Status: DC

## 2010-11-05 ENCOUNTER — Telehealth: Payer: Self-pay | Admitting: Cardiology

## 2010-11-05 ENCOUNTER — Emergency Department (HOSPITAL_COMMUNITY): Payer: Medicare Other

## 2010-11-05 ENCOUNTER — Observation Stay (HOSPITAL_COMMUNITY)
Admission: EM | Admit: 2010-11-05 | Discharge: 2010-11-06 | Disposition: A | Discharge status: Home / Self Care | Payer: Medicare Other | Attending: Cardiovascular Disease | Admitting: Cardiovascular Disease

## 2010-11-05 DIAGNOSIS — R42 Dizziness and giddiness: Secondary | ICD-10-CM

## 2010-11-05 DIAGNOSIS — M1610 Unilateral primary osteoarthritis, unspecified hip: Secondary | ICD-10-CM | POA: Insufficient documentation

## 2010-11-05 DIAGNOSIS — R002 Palpitations: Secondary | ICD-10-CM

## 2010-11-05 DIAGNOSIS — I951 Orthostatic hypotension: Secondary | ICD-10-CM | POA: Insufficient documentation

## 2010-11-05 DIAGNOSIS — R0602 Shortness of breath: Secondary | ICD-10-CM | POA: Insufficient documentation

## 2010-11-05 DIAGNOSIS — M169 Osteoarthritis of hip, unspecified: Secondary | ICD-10-CM | POA: Insufficient documentation

## 2010-11-05 DIAGNOSIS — N400 Enlarged prostate without lower urinary tract symptoms: Secondary | ICD-10-CM | POA: Insufficient documentation

## 2010-11-05 DIAGNOSIS — I10 Essential (primary) hypertension: Secondary | ICD-10-CM | POA: Insufficient documentation

## 2010-11-05 DIAGNOSIS — I4891 Unspecified atrial fibrillation: Principal | ICD-10-CM | POA: Insufficient documentation

## 2010-11-05 DIAGNOSIS — Z86718 Personal history of other venous thrombosis and embolism: Secondary | ICD-10-CM | POA: Insufficient documentation

## 2010-11-05 DIAGNOSIS — Z79899 Other long term (current) drug therapy: Secondary | ICD-10-CM | POA: Insufficient documentation

## 2010-11-05 DIAGNOSIS — Z96649 Presence of unspecified artificial hip joint: Secondary | ICD-10-CM | POA: Insufficient documentation

## 2010-11-05 DIAGNOSIS — Z7901 Long term (current) use of anticoagulants: Secondary | ICD-10-CM | POA: Insufficient documentation

## 2010-11-05 LAB — BASIC METABOLIC PANEL
BUN: 10 mg/dL (ref 6–23)
CO2: 23 mEq/L (ref 19–32)
Calcium: 9.2 mg/dL (ref 8.4–10.5)
Chloride: 104 mEq/L (ref 96–112)
Creatinine, Ser: 0.96 mg/dL (ref 0.50–1.35)
Glucose, Bld: 95 mg/dL (ref 70–99)

## 2010-11-05 LAB — POCT I-STAT, CHEM 8
BUN: 9 mg/dL (ref 6–23)
Creatinine, Ser: 1 mg/dL (ref 0.50–1.35)
Glucose, Bld: 96 mg/dL (ref 70–99)
Hemoglobin: 11.2 g/dL — ABNORMAL LOW (ref 13.0–17.0)
Potassium: 4.2 mEq/L (ref 3.5–5.1)
TCO2: 21 mmol/L (ref 0–100)

## 2010-11-05 LAB — URINALYSIS, ROUTINE W REFLEX MICROSCOPIC
Hgb urine dipstick: NEGATIVE
Specific Gravity, Urine: 1.014 (ref 1.005–1.030)
Urobilinogen, UA: 1 mg/dL (ref 0.0–1.0)

## 2010-11-05 LAB — DIFFERENTIAL
Eosinophils Absolute: 0.1 10*3/uL (ref 0.0–0.7)
Lymphocytes Relative: 19 % (ref 12–46)
Lymphs Abs: 2.4 10*3/uL (ref 0.7–4.0)
Monocytes Relative: 9 % (ref 3–12)
Neutrophils Relative %: 71 % (ref 43–77)

## 2010-11-05 LAB — CBC
HCT: 32.4 % — ABNORMAL LOW (ref 39.0–52.0)
MCH: 30.9 pg (ref 26.0–34.0)
MCV: 97.3 fL (ref 78.0–100.0)
Platelets: 526 10*3/uL — ABNORMAL HIGH (ref 150–400)
RBC: 3.33 MIL/uL — ABNORMAL LOW (ref 4.22–5.81)

## 2010-11-05 LAB — APTT: aPTT: 100 seconds — ABNORMAL HIGH (ref 24–37)

## 2010-11-05 NOTE — Telephone Encounter (Signed)
Left a message to call back.

## 2010-11-05 NOTE — Telephone Encounter (Signed)
Pt had total hip replacement on 7/17. Pt having sob htn and irregular heart when he moves

## 2010-11-06 ENCOUNTER — Ambulatory Visit: Payer: Medicare Other | Admitting: Internal Medicine

## 2010-11-06 DIAGNOSIS — I4891 Unspecified atrial fibrillation: Secondary | ICD-10-CM

## 2010-11-06 LAB — BASIC METABOLIC PANEL
Calcium: 8.7 mg/dL (ref 8.4–10.5)
Chloride: 105 mEq/L (ref 96–112)
Creatinine, Ser: 0.86 mg/dL (ref 0.50–1.35)
GFR calc Af Amer: >60 mL/min (ref >60)

## 2010-11-06 LAB — CBC
Platelets: 510 10*3/uL — ABNORMAL HIGH (ref 150–400)
RDW: 15.2 % (ref 11.5–15.5)
WBC: 8.5 10*3/uL (ref 4.0–10.5)

## 2010-11-06 LAB — TSH: TSH: 1.225 u[IU]/mL (ref 0.350–4.500)

## 2010-11-06 LAB — T4, FREE: Free T4: 0.94 ng/dL (ref 0.80–1.80)

## 2010-11-06 LAB — PROTIME-INR
INR: 3.06 — ABNORMAL HIGH (ref 0.00–1.49)
Prothrombin Time: 32.1 seconds — ABNORMAL HIGH (ref 11.6–15.2)

## 2010-11-06 NOTE — Telephone Encounter (Signed)
Called patient's wife. He just got back from the hospital and is now home. Went in on 8/29 by ambulance with atrial fib but she states that he converted with medication. She states that he is comfortable now and does not need anything from [place]. He is set up for the Coumadin clinic and a post hospital visit with Dr.Hochrein.

## 2010-11-07 ENCOUNTER — Ambulatory Visit (INDEPENDENT_AMBULATORY_CARE_PROVIDER_SITE_OTHER): Payer: Medicare Other | Admitting: *Deleted

## 2010-11-07 DIAGNOSIS — I4891 Unspecified atrial fibrillation: Secondary | ICD-10-CM

## 2010-11-07 DIAGNOSIS — Z86718 Personal history of other venous thrombosis and embolism: Secondary | ICD-10-CM

## 2010-11-07 LAB — POCT INR: INR: 2.6

## 2010-11-11 ENCOUNTER — Telehealth: Payer: Self-pay | Admitting: Cardiology

## 2010-11-11 NOTE — Telephone Encounter (Signed)
Patient wife called. Pt went to the emergency room by EMS. C/O fatigue, tired.

## 2010-11-11 NOTE — Telephone Encounter (Signed)
Pt's wife called back wanting to know when he can get an appt? Can reach [phone] or [phone]

## 2010-11-11 NOTE — Telephone Encounter (Signed)
Spoke with pt wife, the pt is having fatigue. He was in the hosp last week for atrial fib. This fatigue is the same as before when he went into atrial fib. The pt is unable to tell if he is in atrial fib or not. He can not tell his heart racing or not. They have an appt with dr Antoine Poche 12-04-10 and wonders if can be seen sooner. They do not want to see the pa. The wife is very anxious about the pt being seen sooner will forward for dr hochrein review [name] [name]

## 2010-11-12 ENCOUNTER — Ambulatory Visit (INDEPENDENT_AMBULATORY_CARE_PROVIDER_SITE_OTHER): Payer: Self-pay | Admitting: Cardiology

## 2010-11-12 DIAGNOSIS — R0989 Other specified symptoms and signs involving the circulatory and respiratory systems: Secondary | ICD-10-CM

## 2010-11-12 NOTE — Discharge Summary (Signed)
NAMEERICA, OSUNA NO.:  [id number]  MEDICAL RECORD NO.:  [id number]  LOCATION:  1403                         FACILITY:  [facility]  PHYSICIAN:  Veverly Fells. Excell Seltzer, MD  DATE OF BIRTH:  1945/09/19  DATE OF ADMISSION:  11/05/2010 DATE OF DISCHARGE:  11/06/2010                              DISCHARGE SUMMARY   FINAL DIAGNOSIS:  Atrial fibrillation with rapid ventricular response.  SECONDARY DIAGNOSES: 1. Orthostatic hypotension. 2. Essential hypertension. 3. Degenerative joint disease, status post bilateral total hip     arthroplasty. 4. History of pulmonary embolus following back surgery in 2010.  HOSPITAL COURSE:  Mr. Beckner is a 65 year old gentleman with history of paroxysmal atrial fibrillation.  He is chronically anticoagulated with warfarin.  He presented with weakness, palpitations, and orthostatic dizziness.  He was found to be in atrial fibrillation with RVR.  The patient was evaluated by Dr. Johney Frame.  It was noted that he has been therapeutically anticoagulated for greater than 1 month and he was given 300 mg of flecainide for chemical cardioversion.  This was successful and the patient remained in sinus rhythm overnight.  He was initiated on flecainide 100 mg twice daily.  Upon my evaluation in the morning, the patient was hemodynamically stable.  He did have mild orthostatic hypotension, but minimal symptoms with this.  He had no other complaints.  The patient will be discharged home on flecainide. There was a recommendation to perform an exercise treadmill test to rule out proarrhythmic effects of flecainide.  However, the patient is unable to do this because of his degenerative joint disease.  We will arrange followup with Dr. Antoine Poche in approximately 4 weeks.  The medication changes included addition of flecainide 100 mg twice daily and reduction of long-acting diltiazem from 240 mg to 120 mg daily.  Other home medications will stay the  same.  PERTINENT DIAGNOSTIC FINDINGS:  A chest x-ray from August 29, showed no acute cardiopulmonary abnormality.  There was no pneumothorax or pleural effusion present.  Laboratory studies showed a hemoglobin of 10.3 with a platelet count of 510,000, white blood cell count was 8.5.  The patient's discharge hemoglobin after his hip surgery was 9.6, so this was consistent with those findings.  Renal function was normal with a creatinine of 0.86, potassium was 3.7.  The patient's INR was 3.06.  DISCHARGE INSTRUCTIONS:  A followup appointment will be made with Dr. Antoine Poche through the office and we will contact him with that information.  DISCHARGE MEDICATIONS: 1. Cardizem CD 120 mg daily (reduced from previous home dose of 240 mg     daily). 2. Flecainide 100 mg twice daily (new medication). 3. Neurontin 300 mg t.i.d. 4. Coumadin as directed. 5. Omeprazole 20 mg daily. 6. Methocarbamol 500 mg every 6 hours as needed. 7. Ambien 10 mg 1 to 2 at bedtime as needed. 8. Zoloft 50 mg at bedtime.  DISCHARGE CONDITION:  Stable.Veverly Fells. Excell Seltzer, MD     MDC/MEDQ  D:  11/06/2010  T:  11/06/2010  Job:  [id number]  cc:   Rollene Rotunda, MD, [facility] [address]  [address] [place] [address]  Titus Dubin. Alwyn Ren, MD,FACP,FCCP [phone] [address]  [address] [place] [address]  Electronically Signed by Tonny Bollman MD on 11/12/2010 11:37:03 PM

## 2010-11-12 NOTE — Telephone Encounter (Signed)
We can try to move the patient up

## 2010-11-12 NOTE — Telephone Encounter (Signed)
Pt very SOB today during therapy.  Pt given an appointment for 11/13/2010 at 2:15 pm.  Wife was very appreciative.

## 2010-11-13 ENCOUNTER — Ambulatory Visit (INDEPENDENT_AMBULATORY_CARE_PROVIDER_SITE_OTHER): Payer: Medicare Other | Admitting: Cardiology

## 2010-11-13 ENCOUNTER — Encounter: Payer: Self-pay | Admitting: Cardiology

## 2010-11-13 DIAGNOSIS — I10 Essential (primary) hypertension: Secondary | ICD-10-CM

## 2010-11-13 DIAGNOSIS — J984 Other disorders of lung: Secondary | ICD-10-CM

## 2010-11-13 DIAGNOSIS — I4891 Unspecified atrial fibrillation: Secondary | ICD-10-CM

## 2010-11-13 NOTE — Assessment & Plan Note (Signed)
At this point I will continue meds as listed. I will check an iron level. Later this month when he can walk I will bring him back for treadmill testing.

## 2010-11-13 NOTE — Patient Instructions (Signed)
Please have blood work drawn today.  The current medical regimen is effective;  continue present plan and medications.  Your physician has requested that you have an exercise tolerance test. For further information please visit [email or web address] Please also follow instruction sheet, as given.

## 2010-11-13 NOTE — Assessment & Plan Note (Signed)
He had very minimal septal hypertrophy in 2009. I reviewed this echo. At this point he's had no change in symptoms or his exam. I will consider repeat echocardiography in the future.

## 2010-11-13 NOTE — Progress Notes (Signed)
HPI The patient presented for an evaluation after a recent brief hospitalization for atrial fibrillation. I reviewed this hospitalization and the notes and data available. He was treated with flecainide at that time. He converted to sinus rhythm. At home he has since done well. With his arrhythmia he had felt lightheaded and had a rapid rate. However, he is no longer having problems with this. He is recovering from her recent hip surgery. He is having no chest pressure, neck or arm discomfort. Has not had any shortness of breath, PND or orthopnea. He has had no weight gain or edema.  Allergies  Allergen Reactions  . Penicillins Anaphylaxis         Current Outpatient Prescriptions  Medication Sig Dispense Refill  . diazepam (VALIUM) 5 MG tablet Take 5 mg by mouth every 6 (six) hours as needed.        . diltiazem (CARDIZEM CD) 240 MG 24 hr capsule TAKE 1 CAPSULE DAILY  90 capsule  2  . diltiazem (DILACOR XR) 240 MG 24 hr capsule Take 240 mg by mouth daily.        . fluticasone (FLOVENT DISKUS) 50 MCG/BLIST diskus inhaler Inhale 1 puff into the lungs 2 (two) times daily.        [name] gabapentin (NEURONTIN) 300 MG capsule Take 300 mg by mouth 3 (three) times daily.        . metoprolol succinate (TOPROL-XL) 25 MG 24 hr tablet Take 25 mg by mouth daily.        . Mometasone Furo-Formoterol Fum (DULERA) 200-5 MCG/ACT AERO Inhale into the lungs every 12 (twelve) hours. Gargle and spit after use.       [name] omeprazole (PRILOSEC) 20 MG capsule TAKE 1 CAPSULE DAILY  90 capsule  3  . oxyCODONE-acetaminophen (PERCOCET) 10-325 MG per tablet Take 1 tablet by mouth every 6 (six) hours as needed.        . sertraline (ZOLOFT) 50 MG tablet 1 and 1/2 tab daily  45 tablet  3  . warfarin (COUMADIN) 3 MG tablet Take by mouth as directed.          Past Medical History  Diagnosis Date  . BPH (benign prostatic hypertrophy)   . Spinal stenosis     congential    Past Surgical History  Procedure Date  . Vasectomy   .  Lumbar fusion     ROS:  As stated in the HPI and negative for all other systems.  PHYSICAL EXAM There were no vitals taken for this visit. GENERAL:  Well appearing HEENT:  Pupils equal round and reactive, fundi not visualized, oral mucosa unremarkable NECK:  No jugular venous distention, waveform within normal limits, carotid upstroke brisk and symmetric, no bruits, no thyromegaly LYMPHATICS:  No cervical, inguinal adenopathy LUNGS:  Clear to auscultation bilaterally BACK:  No CVA tenderness CHEST:  Unremarkable HEART:  PMI not displaced or sustained,S1 and S2 within normal limits, no S3, no S4, no clicks, no rubs, no murmurs ABD:  Flat, positive bowel sounds normal in frequency in pitch, no bruits, no rebound, no guarding, no midline pulsatile mass, no hepatomegaly, no splenomegaly, obese EXT:  2 plus pulses throughout, no edema, no cyanosis no clubbing SKIN:  No rashes no nodules NEURO:  Cranial nerves II through XII grossly intact, motor grossly intact throughout PSYCH:  Cognitively intact, oriented to person place and time  EKG:  Sinus rhythm, rate 71, axis within normal limits, intervals within normal limits, no acute ST-T wave changes.  ASSESSMENT AND PLAN

## 2010-11-13 NOTE — H&P (Signed)
NAMEDAMAURI, MINION NO.:  [id number]  MEDICAL RECORD NO.:  [id number]  LOCATION:  1403                         FACILITY[facility]  [facility]  PHYSICIAN:  Hillis Range, MD       DATE OF BIRTH:  [birth year]  DATE OF ADMISSION:  11/05/2010 DATE OF DISCHARGE:                             HISTORY & PHYSICAL   CHIEF COMPLAINT:  Dizziness and palpitations.  HISTORY OF PRESENT ILLNESS:  Mr. Griep is a pleasant 66 year old gentleman with a known history of paroxysmal atrial fibrillation and hypertension who is chronically anticoagulated with Coumadin.  He presents with symptomatic palpitations.  The patient reports being in his usual state of health until this past Saturday when he developed symptoms of irregular heartbeat with orthostatic dizziness and fatigue. When his symptoms did not resolve, he subsequently presented to [facility] Emergency Department where he was found to have returned to atrial fibrillation.  Presently, he is resting comfortably and is without complaint.  He reports initially being diagnosed with atrial fibrillation in 2009. He has been followed by Dr. Antoine Poche and treated with diltiazem and metoprolol chronically.  He is chronically anticoagulated with Coumadin and has recently had therapeutic INR's.  He did have right total hip replacement, July 17th, but has done reasonably well since that time. He is otherwise without complaint.  Presently, he denies chest pain, presyncope, or syncope.  PAST MEDICAL HISTORY: 1. Paroxysmal atrial fibrillation, chronically anticoagulated with     Coumadin. 2. Hypertension. 3. Status post stress echo in [place] in June 2010, which per Dr.     Jenene Slicker recent office note revealed no ischemia and a normal     ejection fraction. 4. Mild left atrial enlargement. 5. BPH. 6. Congenital spinal stenosis. 7. Degenerative disk disease. 8. Degenerative joint disease. 9. The patient had a pulmonary emboli in the  setting of back surgery     in 2010.  PAST SURGICAL HISTORY: 1. Lumbar fusion with multiple prior back surgeries. 2. Vasectomy. 3. Bilateral hip replacements.  MEDICATIONS: 1. Coumadin. 2. Neurontin 300 mg t.i.d. 3. Omeprazole 20 mg daily. 4. Methocarbamol 500 mg q.6 hours p.r.n. 5. Diltiazem 240 mg q.h.s. 6. Ambien 10 mg q.h.s. 7. Zoloft 50 mg q.h.s.  ALLERGIES:  PENICILLIN causes anaphylaxis.  SOCIAL HISTORY:  The patient lives in [place].  He denies tobacco, alcohol, or drug use.  FAMILY HISTORY:  The patient's mother had atrial fibrillation.  PHYSICAL EXAM:  VITAL SIGNS:  Blood pressure 117/79, heart rate 94, respirations 24, sats 97% on room air.  Telemetry reveals atrial fibrillation with a ventricular rate in the 90s. GENERAL:  The patient is a well-appearing male, in no acute distress. He is alert and oriented x3. HEENT:  Normocephalic, atraumatic.  Sclerae clear.  Conjunctivae pink. Oropharynx clear. NECK:  Supple.  JVP 8 cm. LUNGS:  Clear. HEART:  Irregularly irregular rhythm.  No murmurs, rubs, or gallops. GI:  Soft, nontender, nondistended.  Positive bowel sounds. EXTREMITIES:  No clubbing, cyanosis, or edema. SKIN:  No ecchymoses or lacerations. MUSCULOSKELETAL:  No deformity or atrophy. PSYCH:  Euthymic mood, full affect.  EKG today reveals atrial fibrillation with a ventricular rate of 99 beats per minute, otherwise  unremarkable.  Labs, INR 3.1, potassium 4.2, creatinine 0.9, hematocrit 32.  White blood cell count 12.5, platelets 526, troponin 0.  Echocardiogram from June 29, 2007, is reviewed and reveals an ejection fraction of 55-60%, otherwise unremarkable.  IMPRESSION:  Mr. Kawai is a pleasant 65 year old gentleman with a known history of paroxysmal atrial fibrillation who now presents with symptomatic atrial fibrillation.  His symptoms of mild postural dizziness are consistent with prior symptoms of atrial fibrillation.  He is clinically  stable at this time.  We will plan to admit the patient to the Cardiology Service for further management.  I think that the most prudent strategy at this time would be to try to convert the patient to sinus rhythm with flecainide.  He has been adequately anticoagulated with Coumadin.  I will therefore give him flecainide 300 mg p.o. x1.  If he converts to sinus rhythm, then we will maintain him on flecainide 100 mg twice daily long term.  He will continue his home regimen of Cardizem and metoprolol.  If he does not convert to sinus rhythm, he will require cardioversion in the morning.  He will need an outpatient GXT on flecainide if he continues this medicine long term.  For his orthostatic dizziness, we will check orthostatics and gently hydrate the patient overnight.     Hillis Range, MD     JA/MEDQ  D:  11/05/2010  T:  11/06/2010  Job:  [id number]  cc:   Rollene Rotunda, MD, [facility] [address]  [address] [place] [address]  Electronically Signed by Hillis Range MD on 11/13/2010 08:54:02 AM

## 2010-11-13 NOTE — Assessment & Plan Note (Signed)
His blood pressure is controlled. He will continue the meds as listed.

## 2010-11-21 ENCOUNTER — Other Ambulatory Visit: Payer: Self-pay | Admitting: Cardiology

## 2010-12-02 LAB — CBC
HCT: 44.6
MCV: 94.3
Platelets: 402 — ABNORMAL HIGH
RDW: 14.3

## 2010-12-02 LAB — BASIC METABOLIC PANEL
BUN: 9
Chloride: 103
Glucose, Bld: 105 — ABNORMAL HIGH
Potassium: 3.9

## 2010-12-02 LAB — PROTIME-INR: Prothrombin Time: 13.7

## 2010-12-02 LAB — POCT CARDIAC MARKERS: Operator id: 1211

## 2010-12-02 LAB — DIFFERENTIAL
Basophils Absolute: 0.1
Eosinophils Absolute: 0.1
Eosinophils Relative: 1
Lymphs Abs: 3.4
Monocytes Absolute: 1.1 — ABNORMAL HIGH

## 2010-12-02 LAB — CARDIAC PANEL(CRET KIN+CKTOT+MB+TROPI)
CK, MB: 2.9
CK, MB: 3.5
Relative Index: 3.3 — ABNORMAL HIGH
Troponin I: 0.02

## 2010-12-02 LAB — TSH: TSH: 0.952

## 2010-12-04 ENCOUNTER — Ambulatory Visit (INDEPENDENT_AMBULATORY_CARE_PROVIDER_SITE_OTHER): Payer: Medicare Other | Admitting: *Deleted

## 2010-12-04 ENCOUNTER — Ambulatory Visit (INDEPENDENT_AMBULATORY_CARE_PROVIDER_SITE_OTHER): Payer: Medicare Other | Admitting: Cardiology

## 2010-12-04 ENCOUNTER — Encounter: Payer: Medicare Other | Admitting: Cardiology

## 2010-12-04 DIAGNOSIS — I4891 Unspecified atrial fibrillation: Secondary | ICD-10-CM

## 2010-12-04 DIAGNOSIS — Z86718 Personal history of other venous thrombosis and embolism: Secondary | ICD-10-CM

## 2010-12-04 NOTE — Progress Notes (Signed)
Exercise Treadmill Test  Pre-Exercise Testing Evaluation Rhythm: normal sinus  Rate: 54   PR:  .20 QRS:  .09  QT:  .44 QTc: .42     Test  Exercise Tolerance Test Ordering MD: Angelina Sheriff, MD  Interpreting MD:  Angelina Sheriff, MD  Unique Test No: 1  Treadmill:  1  Indication for ETT: A-FIB  Contraindication to ETT: No   Stress Modality: exercise - treadmill  Cardiac Imaging Performed: non   Protocol: modified Bruce - maximal  Max BP:  142/54  Max MPHR (bpm):  154 85% MPR (bpm):  130  MPHR obtained (bpm):  95 % MPHR obtained:    Reached 85% MPHR (min:sec):  NA Total Exercise Time (min-sec):  9:11  Workload in METS:  4.6 Borg Scale: 17  Reason ETT Terminated:  dyspnea    ST Segment Analysis At Rest: normal ST segments - no evidence of significant ST depression With Exercise: no evidence of significant ST depression  Other Information Arrhythmia:  No Angina during ETT:  absent (0) Quality of ETT:  non-diagnostic  ETT Interpretation:  normal - no evidence of ischemia by ST analysis  Comments: The patient had an poor exercise tolerance.  There was no chest pain.  There was an increased level of dyspnea.  There were no arrhythmias, a normal BP response.  There were no ischemic ST T wave changes.   Recommendations: This was a nondiagnostic study as he did not reach the target heart rate. However, there was no ischemia and, in particular, no proarrhythmia on Flecainide. I gave him a prescription for exercise based on this study.

## 2010-12-17 ENCOUNTER — Telehealth: Payer: Self-pay | Admitting: Cardiology

## 2010-12-17 MED ORDER — FLECAINIDE ACETATE 100 MG PO TABS: 100.0000 mg | ORAL_TABLET | Freq: Two times a day (BID) | ORAL | Status: DC

## 2010-12-17 MED ORDER — METOPROLOL SUCCINATE ER 25 MG PO TB24: 25.0000 mg | ORAL_TABLET | ORAL | Status: DC | PRN

## 2010-12-17 MED ORDER — DILTIAZEM HCL 240 MG PO CP24: 240.0000 mg | ORAL_CAPSULE | Freq: Every day | ORAL | Status: DC

## 2010-12-17 NOTE — Telephone Encounter (Signed)
flecanide 100mg , metoprolol 25mg , and diltiazem 120mg  refill needed,  90 days with refills, called to [facility]

## 2010-12-22 ENCOUNTER — Telehealth: Payer: Self-pay | Admitting: Cardiology

## 2010-12-22 NOTE — Telephone Encounter (Signed)
Called to clarify the new script for Diltiazem XR   [facility]

## 2010-12-22 NOTE — Telephone Encounter (Signed)
[facility] question about ditiazem please call

## 2010-12-24 ENCOUNTER — Ambulatory Visit (INDEPENDENT_AMBULATORY_CARE_PROVIDER_SITE_OTHER): Payer: Medicare Other

## 2010-12-24 DIAGNOSIS — Z23 Encounter for immunization: Secondary | ICD-10-CM

## 2010-12-25 ENCOUNTER — Ambulatory Visit (INDEPENDENT_AMBULATORY_CARE_PROVIDER_SITE_OTHER): Payer: Medicare Other | Admitting: *Deleted

## 2010-12-25 DIAGNOSIS — I4891 Unspecified atrial fibrillation: Secondary | ICD-10-CM

## 2010-12-25 DIAGNOSIS — Z86718 Personal history of other venous thrombosis and embolism: Secondary | ICD-10-CM

## 2011-01-05 ENCOUNTER — Ambulatory Visit (INDEPENDENT_AMBULATORY_CARE_PROVIDER_SITE_OTHER): Payer: Medicare Other | Admitting: *Deleted

## 2011-01-05 DIAGNOSIS — I4891 Unspecified atrial fibrillation: Secondary | ICD-10-CM

## 2011-01-05 DIAGNOSIS — Z86718 Personal history of other venous thrombosis and embolism: Secondary | ICD-10-CM

## 2011-01-14 ENCOUNTER — Encounter: Payer: Self-pay | Admitting: Internal Medicine

## 2011-01-14 ENCOUNTER — Ambulatory Visit (INDEPENDENT_AMBULATORY_CARE_PROVIDER_SITE_OTHER): Payer: Medicare Other | Admitting: Internal Medicine

## 2011-01-14 VITALS — BP 120/72 | HR 60 | Temp 98.1°F | Wt 264.0 lb

## 2011-01-14 DIAGNOSIS — J209 Acute bronchitis, unspecified: Secondary | ICD-10-CM

## 2011-01-14 MED ORDER — SULFAMETHOXAZOLE-TRIMETHOPRIM 800-160 MG PO TABS: 1.0000 | ORAL_TABLET | Freq: Two times a day (BID) | ORAL | Status: AC

## 2011-01-14 MED ORDER — MOMETASONE FURO-FORMOTEROL FUM 200-5 MCG/ACT IN AERO: 1.0000 | INHALATION_SPRAY | Freq: Two times a day (BID) | RESPIRATORY_TRACT | Status: DC

## 2011-01-14 NOTE — Patient Instructions (Signed)
Dulera  one- 2  inhalations every 12 hours; gargle and spit after use . Use Claritin daily as needed for allergic symptoms. Take this record when you travel. I have not prescribed a short acting bronchodilator because of your history of dysrhythmias.

## 2011-01-14 NOTE — Progress Notes (Signed)
  Subjective:    Patient ID: [name] [name], male    DOB: 10-03-1945, 65 y.o.   MRN: [id number]  HPI Cough Onset:2 weeks as cough  Extrinsic symptoms:itchy eyes,rhinitis:yes. He denies sneezing  Infectious symptoms :fever, purulent secretions :temp to ?for seconds; sputum yellow - brown Chest symptoms:pleuritic  pain, hemoptysis: no . Some dyspnea,wheezing: GI symptoms: Dyspepsia, reflux: no Occupational/environmental exposures:no Smoking:quit 2002 ACE inhibitor:no Treatment/efficacy: no treatment; he has had no inhalers since August Past medical history: adult onset asthma ; COPD. He has had seasonal, spring and summer, allergies since moving to [place]    Review of Systems he specifically denies frontal headaches, facial  pain or purulent nasal discharge     Objective:   Physical Exam General appearance is of good health and nourishment; no acute distress or increased work of breathing is present.  No  lymphadenopathy about the head, neck, or axilla noted.   Eyes: No conjunctival inflammation or lid edema is present.   Ears:  External ear exam shows no significant lesions or deformities.  Otoscopic examination reveals clear canals, tympanic membranes are intact bilaterally without bulging, retraction, inflammation or discharge.  Nose:  External nasal examination shows no deformity or inflammation. Nasal mucosa are pink and moist without lesions or exudates. No septal dislocation or dislocation.No obstruction to airflow.   Oral exam: Dental hygiene is good; lips and gums are healthy appearing.There is no oropharyngeal erythema or exudate noted.     Heart:  Normal rate and regular rhythm. S1 and S2 normal without gallop, murmur, click, rub or other extra sounds. Heart sounds are distant  Lungs:Chest : Diffuse, homogenous, low-grade musical wheezing and rhonchi without increased work of breathing. Extremities:  No cyanosis, edema, or clubbing  noted    Skin: Warm & dry          Assessment & Plan:  #1 bronchitis, acute with bronchospasm. No history or physical findings to suggest rhinosinusitis  #2 past history of anaphylaxis with penicillin  #3 possible extrinsic component as trigger  Plan: See orders and recommendations.

## 2011-01-16 ENCOUNTER — Ambulatory Visit (INDEPENDENT_AMBULATORY_CARE_PROVIDER_SITE_OTHER)
Admission: RE | Admit: 2011-01-16 | Discharge: 2011-01-16 | Disposition: A | Discharge status: Home / Self Care | Payer: Medicare Other | Source: Ambulatory Visit | Attending: Internal Medicine | Admitting: Internal Medicine

## 2011-01-16 ENCOUNTER — Telehealth: Payer: Self-pay

## 2011-01-16 DIAGNOSIS — J209 Acute bronchitis, unspecified: Secondary | ICD-10-CM

## 2011-01-16 NOTE — Telephone Encounter (Signed)
Per Dr.Hopper: No pneumonia and ok to travel. Spoke with patient's wife, information ok'd

## 2011-01-16 NOTE — Telephone Encounter (Signed)
Pt's wife requesting chest x-ray because pt is still tired and coughing and sleeping a lot.  Per Dr. Alwyn Ren, order chest x-ray and send pt to [facility].  Pt's wife aware and will take pt to [facility]. She is requesting a call back today if results come back to day because they are planning to travel on Sunday or Monday and want to be sure pt is ok to travel

## 2011-02-03 ENCOUNTER — Ambulatory Visit (INDEPENDENT_AMBULATORY_CARE_PROVIDER_SITE_OTHER): Payer: Self-pay | Admitting: Cardiology

## 2011-02-03 DIAGNOSIS — R0989 Other specified symptoms and signs involving the circulatory and respiratory systems: Secondary | ICD-10-CM

## 2011-02-03 DIAGNOSIS — Z86718 Personal history of other venous thrombosis and embolism: Secondary | ICD-10-CM

## 2011-02-03 DIAGNOSIS — I4891 Unspecified atrial fibrillation: Secondary | ICD-10-CM

## 2011-02-25 LAB — POCT INR: INR: 1.5

## 2011-02-26 ENCOUNTER — Ambulatory Visit (INDEPENDENT_AMBULATORY_CARE_PROVIDER_SITE_OTHER): Payer: Self-pay | Admitting: Cardiology

## 2011-02-26 DIAGNOSIS — R0989 Other specified symptoms and signs involving the circulatory and respiratory systems: Secondary | ICD-10-CM

## 2011-02-26 DIAGNOSIS — I4891 Unspecified atrial fibrillation: Secondary | ICD-10-CM

## 2011-02-26 DIAGNOSIS — Z86718 Personal history of other venous thrombosis and embolism: Secondary | ICD-10-CM

## 2011-02-26 NOTE — Patient Instructions (Signed)
Reviewed leafy green vegetable intake, limit until we can get INR back up

## 2011-03-11 ENCOUNTER — Ambulatory Visit (INDEPENDENT_AMBULATORY_CARE_PROVIDER_SITE_OTHER)
Admission: RE | Admit: 2011-03-11 | Discharge: 2011-03-11 | Disposition: A | Discharge status: Home / Self Care | Payer: Medicare Other | Source: Ambulatory Visit | Attending: Internal Medicine | Admitting: Internal Medicine

## 2011-03-11 ENCOUNTER — Encounter: Payer: Self-pay | Admitting: Internal Medicine

## 2011-03-11 ENCOUNTER — Ambulatory Visit (INDEPENDENT_AMBULATORY_CARE_PROVIDER_SITE_OTHER): Payer: Medicare Other | Admitting: Internal Medicine

## 2011-03-11 DIAGNOSIS — J449 Chronic obstructive pulmonary disease, unspecified: Secondary | ICD-10-CM | POA: Diagnosis not present

## 2011-03-11 DIAGNOSIS — J209 Acute bronchitis, unspecified: Secondary | ICD-10-CM

## 2011-03-11 DIAGNOSIS — J441 Chronic obstructive pulmonary disease with (acute) exacerbation: Secondary | ICD-10-CM

## 2011-03-11 DIAGNOSIS — Z7901 Long term (current) use of anticoagulants: Secondary | ICD-10-CM | POA: Insufficient documentation

## 2011-03-11 DIAGNOSIS — Z86711 Personal history of pulmonary embolism: Secondary | ICD-10-CM

## 2011-03-11 LAB — POCT INR: INR: 2.6

## 2011-03-11 MED ORDER — MOMETASONE FURO-FORMOTEROL FUM 200-5 MCG/ACT IN AERO: 1.0000 | INHALATION_SPRAY | Freq: Two times a day (BID) | RESPIRATORY_TRACT | Status: DC

## 2011-03-11 MED ORDER — SULFAMETHOXAZOLE-TRIMETHOPRIM 800-160 MG PO TABS: 1.0000 | ORAL_TABLET | Freq: Two times a day (BID) | ORAL | Status: AC

## 2011-03-11 NOTE — Progress Notes (Signed)
  Subjective:    Patient ID: [name] [name], male    DOB: 08/05/1945, 66 y.o.   MRN: [id number]  HPI Respiratory tract infection Onset/symptoms:bronchitis treated @ last appt; symptoms progressed in [place] @ Thanksgiving Exposures (illness/environmental/extrinsic):wife had PNA Progression of symptoms:to increased cough & sputum Treatments/response: Albuterol was prescribed in [place]; has been of benefit. He is using this up to 2 inhalations 4 times a day. He has been using Dulera a 2 puffs twice a day, but still needs the rescue inhaler. He received an antibiotic in [place]; unfortunately he does not know the name. Present symptoms: Fever/chills/sweats:some sweats @ night Frontal headache:yes X2 days Facial pain:no Nasal purulence:no Sore throat:no Dental pain:no Lymphadenopathy:no Wheezing/shortness of breath:constant Cough/sputum/hemoptysis:green to yellow Pleuritic pain:no  Smoking history:quit 2002           Review of Systems     Objective:   Physical Exam General appearance:good health ;well nourished; no acute distress or increased work of breathing is present.  No  lymphadenopathy about the head, neck, or axilla noted.   Eyes: No conjunctival inflammation or lid edema is present.  Ears:  External ear exam shows no significant lesions or deformities.  Otoscopic examination reveals clear canals, tympanic membranes are intact bilaterally without bulging, retraction, inflammation or discharge.Minor TM scarring  Nose:  External nasal examination shows no deformity or inflammation. Nasal mucosa are dry without lesions or exudates. No septal dislocation or deviation.No obstruction to airflow.   Oral exam: Dental hygiene is good; lips and gums are healthy appearing.There is no oropharyngeal erythema or exudate noted.      Heart:  Normal rate and regular rhythm. S1 and S2 normal without gallop, murmur, click, rub or other extra sounds.   Lungs:Mild wheezes , rhonchi,rales   present.No increased work of breathing.    Extremities:  No cyanosis, edema, or clubbing  noted    Skin: Warm & dry           Assessment & Plan:   #1 COPD exacerbation with periodic sputum. Therapeutic options are limited by his penicillin allergy ( anaphylaxis by history) and his warfarin therapy. By history Singulair has not been of benefit. Chest x-ray will be ordered and will be placed on sulfa. I will request a pulmonary consultation. He may be a candidate for Daliresp because of the recurrent exacerbations.

## 2011-03-11 NOTE — Progress Notes (Signed)
Addended by: Maurice Small on: 03/11/2011 02:54 PM   Modules accepted: Orders

## 2011-03-11 NOTE — Patient Instructions (Signed)
PT/INR is therapeutic. No change in warfarin dose; repeat PT/INR 03/15/2011.Order for x-rays entered into  the computer; these will be performed at [address] [facility] across from [facility]. No appointment is necessary.

## 2011-03-12 ENCOUNTER — Encounter: Payer: Self-pay | Admitting: Pulmonary Disease

## 2011-03-12 ENCOUNTER — Ambulatory Visit
Admission: RE | Admit: 2011-03-12 | Discharge: 2011-03-12 | Disposition: A | Discharge status: Home / Self Care | Payer: Medicare Other | Source: Ambulatory Visit | Attending: Pulmonary Disease | Admitting: Pulmonary Disease

## 2011-03-12 ENCOUNTER — Ambulatory Visit (INDEPENDENT_AMBULATORY_CARE_PROVIDER_SITE_OTHER): Payer: Medicare Other | Admitting: Pulmonary Disease

## 2011-03-12 DIAGNOSIS — R05 Cough: Secondary | ICD-10-CM | POA: Diagnosis not present

## 2011-03-12 DIAGNOSIS — R0989 Other specified symptoms and signs involving the circulatory and respiratory systems: Secondary | ICD-10-CM

## 2011-03-12 DIAGNOSIS — R059 Cough, unspecified: Secondary | ICD-10-CM

## 2011-03-12 DIAGNOSIS — J3489 Other specified disorders of nose and nasal sinuses: Secondary | ICD-10-CM | POA: Diagnosis not present

## 2011-03-12 DIAGNOSIS — R0609 Other forms of dyspnea: Secondary | ICD-10-CM

## 2011-03-12 NOTE — Assessment & Plan Note (Signed)
The patient is continuing to have cough as well as sinus pressure and congestion.  He has a nasal voice as well.  All of this has persisted despite multiple rounds of antibiotics and prednisone.  I really think a lot of his issues is more upper airway related to this complaint, and would like to image his sinuses to see if he has chronic sinusitis at this point.  Will hold off on further treatment for this until I can see the results.  He also has a history of reflux disease, but feels that it is well controlled with his current dose of proton pump inhibitor.

## 2011-03-12 NOTE — Assessment & Plan Note (Signed)
The patient is describing worsening dyspnea on exertion above his usual baseline, but has not seen any improvement with multiple rounds of antibiotics and prednisone.  This would be very unusual for a COPD exacerbation.  He does not significantly desaturate in the office today with brisk walking, and his chest x-ray yesterday is unremarkable.  He does not have any bronchospasm on exam today, and the wheezing he is describing sounds much more likely to be upper airway than lower.  The patient does have underlying cardiac disease, however a recent echo and stress test were not overly impressive.  He also has a history of thromboembolic disease, but has been on Coumadin and has nothing by exam or history to suggest recurrence.  He is on a good bronchodilator in the form of dulera, and we'll add Spiriva empirically until we can do full pulmonary function studies.  I would like to address some of his other complaints, and see if they have an impact on his symptom of dyspnea.

## 2011-03-12 NOTE — Progress Notes (Signed)
Subjective:    Patient ID: [name] [name], male    DOB: 1946/02/11, 66 y.o.   MRN: [id number]  HPI The patient is a 66 year old male who I've been asked to see for persistent pulmonary symptoms.  He has a history of "COPD" dating back to 2010 when evaluated at the [facility] clinic, but has done fairly well with this.  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  He typically would get some type of respiratory infection once a year, and usually associated with traveling.  Most recently before Thanksgiving, he was traveling [place] and developed his typical URI symptoms.  He was treated with a course of antibiotics and prednisone, but continued to have issues.  He has been treated with multiple courses of antibiotics and prednisone since that time, and recently finished a prednisone taper.  He tells me that he is seen NO improvement.  He was started on dulera around Thanksgiving, and thinks this has helped to some degree.  He continues to have significant dyspnea on exertion that is worse than his usual baseline.  He also has some intermittent shortness of breath at rest.  He has a cough with discolored mucus production, and this has not changed with multiple antibiotics.  He also is having a lot of nasal and head congestion, as well as nasal discharge.  Postnasal drip is also an issue for him.  He has a history of reflux disease, but feels it is currently controlled on his proton pump inhibitor.  He denies any lower extremity edema, calf pain, or pleuritic chest pain.  When he travels by car, he stops very frequently due to his back issues.  He has had a recent chest x-ray that shows chronic changes, but no acute process.  He has a h/o atrial fibrillation, but is on meds and coumadin for this.  He also has a h/o obstructive CM, but last echo ok this year.  Recent stress test in Sept was unremarkable as well.    Review of Systems  Constitutional: Negative for fever and unexpected weight  change.  HENT: Positive for congestion and sneezing. Negative for ear pain, nosebleeds, sore throat, rhinorrhea, trouble swallowing, dental problem, postnasal drip and sinus pressure.   Eyes: Negative for redness and itching.  Respiratory: Positive for cough and shortness of breath. Negative for chest tightness and wheezing.   Cardiovascular: Positive for palpitations. Negative for leg swelling.  Gastrointestinal: Negative for nausea and vomiting.  Genitourinary: Negative for dysuria.  Musculoskeletal: Negative for joint swelling.  Skin: Negative for rash.  Neurological: Positive for headaches.  Hematological: Does not bruise/bleed easily.  Psychiatric/Behavioral: Negative for dysphoric mood. The patient is not nervous/anxious.        Objective:   Physical Exam Constitutional:  Overweight male, no acute distress  HENT:  Nares patent without discharge, +erythematous mucosa  Oropharynx without exudate, palate and uvula are normal  Eyes:  Perrla, eomi, no scleral icterus  Neck:  No JVD, no TMG  Cardiovascular:  Normal rate, regular rhythm, no rubs or gallops.  No murmurs        Intact distal pulses  Pulmonary :  Mildly decreased breath sounds, no stridor or respiratory distress   No rales, rhonchi, or wheezing.  +UA pseudowheezing  Abdominal:  Soft, nondistended, bowel sounds present.  No tenderness noted.   Musculoskeletal:  No lower extremity edema noted.  Lymph Nodes:  No cervical lymphadenopathy noted  Skin:  No cyanosis noted  Neurologic:  Alert, appropriate, moves all 4 extremities without obvious deficit.         Assessment & Plan:

## 2011-03-12 NOTE — Patient Instructions (Signed)
Stay on dulera Stop flovent Start spiriva one inhalation each am. Will do a scan of your sinuses to look for persistent infection.  Will call you with the results.

## 2011-03-13 ENCOUNTER — Telehealth: Payer: Self-pay | Admitting: *Deleted

## 2011-03-13 MED ORDER — PREDNISONE 20 MG PO TABS: 20.0000 mg | ORAL_TABLET | Freq: Two times a day (BID) | ORAL | Status: AC

## 2011-03-13 MED ORDER — MOXIFLOXACIN HCL 400 MG PO TABS: 400.0000 mg | ORAL_TABLET | Freq: Every day | ORAL | Status: AC

## 2011-03-13 NOTE — Telephone Encounter (Signed)
Discontinue sulfa. Prednisone 20 mg twice a day with meals, dispense 14. Avelox 400 mg one daily dispense 5. Check PT/INR in 5 days.

## 2011-03-13 NOTE — Telephone Encounter (Signed)
Spoke to pt and advised to STOP taking Sulfa, advised instructions: Prednisone 20 mg twice a day with meals, dispense 14. Avelox 400 mg one daily dispense 5. Check PT/INR in 5 days. Offered to set up PT/INR check for 5 days from now, pt advised that he would just call in to do that on Monday, sent all rx to [facility] via [facility]

## 2011-03-13 NOTE — Telephone Encounter (Signed)
Pt reports that he is not feeling worse since 01.02.13 OV--c/o fatigue & weakness, cough w/sputum yellow to brown in color causing chest pain & tightness [no high fever and/or SOB].

## 2011-03-18 ENCOUNTER — Ambulatory Visit: Payer: Medicare Other | Admitting: Internal Medicine

## 2011-03-23 ENCOUNTER — Telehealth: Payer: Self-pay | Admitting: Cardiology

## 2011-03-23 NOTE — Telephone Encounter (Signed)
New msg Pt's wife called she said he has been weak and tired and wants to know if he could see Dr Antoine Poche tomorrow. He has coumadin appt tomorrow. Please call

## 2011-03-23 NOTE — Telephone Encounter (Signed)
Patient's wife called. She states that he has been feeling very weak and tired. History of atrial fib on Coumadin. He would like to see Dr.Hochrein tomorrow. Advised to come at 2 pm.

## 2011-03-24 ENCOUNTER — Ambulatory Visit (INDEPENDENT_AMBULATORY_CARE_PROVIDER_SITE_OTHER): Payer: Medicare Other | Admitting: Cardiology

## 2011-03-24 ENCOUNTER — Encounter: Payer: Medicare Other | Admitting: *Deleted

## 2011-03-24 ENCOUNTER — Ambulatory Visit (INDEPENDENT_AMBULATORY_CARE_PROVIDER_SITE_OTHER): Payer: Medicare Other | Admitting: *Deleted

## 2011-03-24 ENCOUNTER — Encounter: Payer: Self-pay | Admitting: Cardiology

## 2011-03-24 DIAGNOSIS — G4700 Insomnia, unspecified: Secondary | ICD-10-CM | POA: Insufficient documentation

## 2011-03-24 DIAGNOSIS — R5383 Other fatigue: Secondary | ICD-10-CM | POA: Diagnosis not present

## 2011-03-24 DIAGNOSIS — R0602 Shortness of breath: Secondary | ICD-10-CM

## 2011-03-24 DIAGNOSIS — R5381 Other malaise: Secondary | ICD-10-CM | POA: Diagnosis not present

## 2011-03-24 DIAGNOSIS — R0989 Other specified symptoms and signs involving the circulatory and respiratory systems: Secondary | ICD-10-CM | POA: Diagnosis not present

## 2011-03-24 DIAGNOSIS — I4891 Unspecified atrial fibrillation: Secondary | ICD-10-CM

## 2011-03-24 DIAGNOSIS — Z7901 Long term (current) use of anticoagulants: Secondary | ICD-10-CM | POA: Diagnosis not present

## 2011-03-24 DIAGNOSIS — Z86718 Personal history of other venous thrombosis and embolism: Secondary | ICD-10-CM | POA: Diagnosis not present

## 2011-03-24 DIAGNOSIS — R0609 Other forms of dyspnea: Secondary | ICD-10-CM | POA: Diagnosis not present

## 2011-03-24 NOTE — Assessment & Plan Note (Signed)
I have sent a note to Dr. Alwyn Ren asking his opinion on the management of this. I certainly think this is contributing to his fatigue.

## 2011-03-24 NOTE — Assessment & Plan Note (Signed)
Check a BNP level. He has not had an echocardiogram since 2009 I will have a low threshold for this.

## 2011-03-24 NOTE — Progress Notes (Signed)
HPI The patient presented for an evaluation of atrial fibrillation.  He says that he has been feeling poorly since the holidays. He travel up [place] and developed a bronchitis. It took him a while to recover from this. He actually saw our pulmonologist and was not felt to have any lower airway problems. He's had a recent sleep study which was negative. He says he is not sleeping at all. He says he feels very fatigued during the day. He feels like he is in atrial fibrillation all the time though he's in sinus today. He says he'll sometimes walk across the floor in his house and feel like he is too fatigued to make it. He's not describing rapid heart rate. He's not describing palpitations, presyncope or syncope. He's not reporting PND or orthopnea. He's had no cough fevers chills weight gain or edema.  Allergies  Allergen Reactions  . Penicillins Anaphylaxis         Current Outpatient Prescriptions  Medication Sig Dispense Refill  . diltiazem (DILACOR XR) 240 MG 24 hr capsule Take 1 capsule (240 mg total) by mouth daily.  90 capsule  3  . flecainide (TAMBOCOR) 100 MG tablet Take 1 tablet (100 mg total) by mouth 2 (two) times daily.  180 tablet  3  . fluticasone (FLOVENT DISKUS) 50 MCG/BLIST diskus inhaler Inhale 1 puff into the lungs 2 (two) times daily.        [name] gabapentin (NEURONTIN) 300 MG capsule Take 300 mg by mouth 3 (three) times daily.        . methocarbamol (ROBAXIN) 500 MG tablet Take 500 mg by mouth as needed.        . metoprolol succinate (TOPROL-XL) 25 MG 24 hr tablet Take 1 tablet (25 mg total) by mouth as needed.  90 tablet  3  . Mometasone Furo-Formoterol Fum (DULERA) 200-5 MCG/ACT AERO Inhale 1-2 puffs into the lungs every 12 (twelve) hours.  8.8 g  5  . omeprazole (PRILOSEC) 20 MG capsule TAKE 1 CAPSULE DAILY  90 capsule  3  . sertraline (ZOLOFT) 50 MG tablet Take 50 mg by mouth daily.        [name] warfarin (COUMADIN) 3 MG tablet TAKE AS DIRECTED BY COUMADIN CLINIC  144 tablet  2    . moxifloxacin (AVELOX) 400 MG tablet Take 1 tablet (400 mg total) by mouth daily.  5 tablet  0  . predniSONE (DELTASONE) 20 MG tablet Take 1 tablet (20 mg total) by mouth 2 (two) times daily with a meal.  14 tablet  0    Past Medical History  Diagnosis Date  . BPH (benign prostatic hypertrophy)   . Spinal stenosis     congential  . Hypertension   . Asthma   . History of blood clots     in legs and lungs following back surgery in 2010  . Allergic rhinitis     Past Surgical History  Procedure Date  . Vasectomy   . Lumbar fusion     x2  . Total hip arthroplasty     bilat    ROS:  As stated in the HPI and negative for all other systems.  PHYSICAL EXAM BP 122/88  Pulse 97  Resp 16  Ht 6\' 2"  (1.88 m)  Wt 250 lb (113.399 kg)  BMI 32.10 kg/m2 GENERAL:  Well appearing HEENT:  Pupils equal round and reactive, fundi not visualized, oral mucosa unremarkable NECK:  No jugular venous distention, waveform within normal limits, carotid upstroke  brisk and symmetric, no bruits, no thyromegaly LYMPHATICS:  No cervical, inguinal adenopathy LUNGS:  Clear to auscultation bilaterally BACK:  No CVA tenderness CHEST:  Unremarkable HEART:  PMI not displaced or sustained,S1 and S2 within normal limits, no S3, no S4, no clicks, no rubs, no murmurs ABD:  Flat, positive bowel sounds normal in frequency in pitch, no bruits, no rebound, no guarding, no midline pulsatile mass, no hepatomegaly, no splenomegaly, obese EXT:  2 plus pulses throughout, no edema, no cyanosis no clubbing SKIN:  No rashes no nodules NEURO:  Cranial nerves II through XII grossly intact, motor grossly intact throughout PSYCH:  Cognitively intact, oriented to person place and time  EKG:  Sinus rhythm, rate 83, axis within normal limits, intervals within normal limits, no acute ST-T wave changes.  03/24/2011   ASSESSMENT AND PLAN

## 2011-03-24 NOTE — Assessment & Plan Note (Signed)
This is actually the predominant complain I'm going to check a BNP, TSH and a CBC. This may all be related to his insomnia. I will also check his rhythm as below. Further evaluation will be based on these results.

## 2011-03-24 NOTE — Patient Instructions (Signed)
Your physician recommends that you schedule a follow-up appointment in: ONE MONTH  Your physician has recommended that you wear a 48 HOUR holter monitor. Holter monitors are medical devices that record the heart's electrical activity. Doctors most often use these monitors to diagnose arrhythmias. Arrhythmias are problems with the speed or rhythm of the heartbeat. The monitor is a small, portable device. You can wear one while you do your normal daily activities. This is usually used to diagnose what is causing palpitations/syncope (passing out).    Your physician recommends that you return for lab work in: TODAY

## 2011-03-24 NOTE — Assessment & Plan Note (Signed)
I will check a 48 hour Holter monitor.

## 2011-03-25 ENCOUNTER — Telehealth: Payer: Self-pay | Admitting: Cardiology

## 2011-03-25 ENCOUNTER — Telehealth: Payer: Self-pay

## 2011-03-25 LAB — CBC WITH DIFFERENTIAL/PLATELET
Basophils Absolute: 0.1 10*3/uL (ref 0.0–0.1)
Eosinophils Absolute: 0.2 10*3/uL (ref 0.0–0.7)
HCT: 39.5 % (ref 39.0–52.0)
Lymphs Abs: 2.5 10*3/uL (ref 0.7–4.0)
MCHC: 33.9 g/dL (ref 30.0–36.0)
Monocytes Absolute: 1.1 10*3/uL — ABNORMAL HIGH (ref 0.1–1.0)
Monocytes Relative: 7.1 % (ref 3.0–12.0)
Platelets: 298 10*3/uL (ref 150.0–400.0)
RDW: 17.7 % — ABNORMAL HIGH (ref 11.5–14.6)

## 2011-03-25 LAB — BRAIN NATRIURETIC PEPTIDE: Pro B Natriuretic peptide (BNP): 74 pg/mL (ref 0.0–100.0)

## 2011-03-25 LAB — TSH: TSH: 1.45 u[IU]/mL (ref 0.35–5.50)

## 2011-03-25 NOTE — Telephone Encounter (Signed)
New Problem   Patient wife calling to discuss holter monitor patient was suppose to get yesterday.  Please return call to Ms. Clydie Braun at [facility] #

## 2011-03-25 NOTE — Telephone Encounter (Signed)
Fu call Pt's wife is calling back. She is worried about getting monitor

## 2011-03-25 NOTE — Telephone Encounter (Signed)
Per [name] [name] - system is not working correctly to be able to place monitor. [name] [name] will call her when it comes back up.  Wife is aware [name] [name] will call.  Wife concerned about pt being weak and tired.  She reports that he is staying in bed a lot.  On review his weight is down 14 lbs since 01/11/2011.  Wife reports that he had been treated for bronchitis recently however his cough remains.  He do not complain of anything in specific.  Labs from yesterday demonstrate an elevated WBC.  I suggested wife may want to call his PCP to follow up on cough and elevated WBC.  She will do so.

## 2011-03-25 NOTE — Telephone Encounter (Signed)
Pt's wife states pt saw cardiologist on yesterday and was told he needed to start with a Holter monitor due to pt possibly being in atrial fibrillation.  Per pt's wife pt was not in atrial fib on yesterday but will start the Holter monitor as soon as the system comes back up.  Pt's wife states pt is weak, very fatigued and has lost weight.  Pt's coumadin was 6.01 and doctor was very alarmed.  Pt was told to hold coumadin for a day until pt is contacted.    Per Dr. Alwyn Ren pt advised to let cardiology complete their work so that orders will not be duplicated.    Pt's wife called back and states that pt's WBC numbers are elevated and cardiology would like pt to be seen this week to be treated. Pt's wife states she will call back on 03/26/11 to set up appt.

## 2011-03-26 ENCOUNTER — Encounter: Payer: Self-pay | Admitting: Internal Medicine

## 2011-03-26 ENCOUNTER — Ambulatory Visit (INDEPENDENT_AMBULATORY_CARE_PROVIDER_SITE_OTHER): Payer: Medicare Other | Admitting: Internal Medicine

## 2011-03-26 VITALS — BP 116/78 | HR 123 | Temp 98.3°F | Wt 242.0 lb

## 2011-03-26 DIAGNOSIS — R5383 Other fatigue: Secondary | ICD-10-CM

## 2011-03-26 DIAGNOSIS — R5381 Other malaise: Secondary | ICD-10-CM

## 2011-03-26 DIAGNOSIS — R000 Tachycardia, unspecified: Secondary | ICD-10-CM | POA: Diagnosis not present

## 2011-03-26 LAB — HEPATIC FUNCTION PANEL
ALT: 17 U/L (ref 0–53)
Alkaline Phosphatase: 57 U/L (ref 39–117)
Bilirubin, Direct: 0.1 mg/dL (ref 0.0–0.3)
Indirect Bilirubin: 0.7 mg/dL (ref 0.0–0.9)

## 2011-03-26 LAB — BASIC METABOLIC PANEL
Chloride: 104 mEq/L (ref 96–112)
Creat: 1.2 mg/dL (ref 0.50–1.35)
Sodium: 138 mEq/L (ref 135–145)

## 2011-03-26 LAB — MAGNESIUM: Magnesium: 2 mg/dL (ref 1.5–2.5)

## 2011-03-26 MED ORDER — ZOLPIDEM TARTRATE 5 MG PO TABS: ORAL_TABLET | ORAL | Status: DC

## 2011-03-26 NOTE — Patient Instructions (Addendum)
To prevent sleep dysfunction follow these instructions for sleep hygiene. Do not read, watch TV, or eat in bed. Do not get into bed until you are ready to turn off the light &  to go to sleep. Do not ingest stimulants ( decongestants, diet pills, nicotine, caffeine) after the evening meal.  If insomnia is a chronic problem; Sleep specialty re- evaluation is indicated. There are many different causes of disturbed sleep including restless leg syndrome , sleep apnea, et Karie Soda. Chronic use of sleeping pills has a number of adverse effects. These include tolerance or lack of effectiveness; automated  behavior such as driving or eating while actually asleep ; & also potential  for habituation.The exact etiology of insomnia  should be be diagnosed for optimal response and avoidance of these potential adverse effects.  To help prevent extra beats, avoid stimulants such as decongestants, diet pills, nicotine, or caffeine (coffee, tea, cola, or chocolate) to excess.

## 2011-03-26 NOTE — Progress Notes (Signed)
Subjective:    Patient ID: [name] [name], male    DOB: 25-Sep-1945, 66 y.o.   MRN: [id number]  HPI FATIGUE Onset:since late December in context of bronchitis  Fatigue @ rest : yes  Primarily  physical fatigue: yes Symptoms: Fever/ chills : intermittent chilling  Night sweats: yes,                                                                                             Vision changes ( blurred/ double/ loss): no                                                                                                 Hoarseness or swallowing dysfunction: mouth dry ;dysphagia with meals                                                                                          Bowel changes( constipation/ diarrhea): no                                                                                     Weight change: yes, 15-20# since 12/1   Exertional chest pain: no but tightness Dyspnea on exertion: constant  Cough: no  Hemoptysis: no  New medications: no  Leg swelling: no   PND: yes  Melena/ rectal bleeding: no  Adenopathy: no  Severe snoring: yes,   Daytime sleepiness: yes,   Skin / hair / nail changes: no  Temperature intolerance( heat/ cold) : intolerant to heat  Feeling depressed: no  Anhedonia: no  Altered appetite:yes Poor sleep/ Apnea : no ; negative testing in past 12mos Abnormal bruising / bleeding or enlarged lymph nodes: no                                                                          Review of Systems labs done 03/24/11 were reviewed. His BNP is well within normal limits at 74. White blood count is elevated at 16,000 but the differential was normal. He completed steroids 1/14. TSH was also therapeutic at 1.45. No anemia was present; hematocrit 39.5. His PT/INR was 6.1 on 1/15. Coumadin is being held.  Cardiology note was reviewed; pulse was 97 on that day.  He states  that he does not feel better when he is on steroids.       Objective:   Physical Exam Gen.: Appears fatigued but  well-nourished in appearance. Alert, appropriate and cooperative throughout exam. Head: Normocephalic without obvious abnormalities Eyes: No corneal or conjunctival inflammation noted.  Extraocular motion intact. No proptosis or lid lag. Neck: No deformities, masses, or tenderness noted. Thyroid normal. Lungs: Normal respiratory effort; chest expands symmetrically. Lungs are clear to auscultation without rales, wheezes, or increased work of breathing.BS are decreased Heart: Very rapid, slightly irregular  rhythm. Distant  flow  murmur. Abdomen: Bowel sounds normal; abdomen soft and nontender. No masses, organomegaly or hernias noted.                                                                                   Musculoskeletal/extremities: No deformity or scoliosis noted of  the thoracic or lumbar spine. No clubbing, cyanosis, edema, or deformity noted. Range of motion  normal .Tone & strength  normal.Joints normal. Nail health  good. Vascular: Carotid, radial artery, dorsalis pedis and  posterior tibial pulses are full and equal. No bruits present. Neurologic: Alert and oriented x3. Deep tendon reflexes symmetrical but 0-1/2+ @ knees        Skin: Intact without suspicious lesions or rashes. Lymph: No cervical, axillary lymphadenopathy present. Psych: Mood and affect are flat but  interactive                                                                                         Assessment & Plan:    #1 fatigue, multifactorial. I believe the major issue here is the atrial fib with an uncontrolled rate superimposed on COPD. BNP does not suggest heart failure.  Thyroid function is normal. He is not anemic. The elevated white count with a normal differential  is most likely related to the recent steroid burst. Adrenal insufficiency is not likely but should be checked.  Chemistries & hepatic panel should be checked as well.  I'll alert his cardiologist due to the significant tachycardia present today. If he gets worse he will need an emergency room with this record.  EKG reveals a sinus tachycardia with a rate of 109. Atrial fibrillation is not present. Occasional PACs. Again nonspecific ST-T changes are present without frank ischemia. His EKG 1/16 revealed a rate of 83. He may be experiencing very rapid paroxysmal  supraventricular rhythms.

## 2011-03-27 ENCOUNTER — Encounter (INDEPENDENT_AMBULATORY_CARE_PROVIDER_SITE_OTHER): Payer: Medicare Other

## 2011-03-27 ENCOUNTER — Ambulatory Visit: Payer: Medicare Other | Admitting: Cardiology

## 2011-03-27 ENCOUNTER — Telehealth: Payer: Self-pay | Admitting: Cardiology

## 2011-03-27 DIAGNOSIS — I4891 Unspecified atrial fibrillation: Secondary | ICD-10-CM | POA: Diagnosis not present

## 2011-03-27 LAB — CORTISOL: Cortisol, Plasma: 17.2 ug/dL

## 2011-03-27 NOTE — Telephone Encounter (Signed)
Wife calls today stating pt needed to be seen by cardiologist today as per her understanding from dr hopper. Reason: pt was tachycardic yesterday in dr hopper's office yesterday  Rate 83-129 according to wife.  She states pt is no worse today than yesterday with his current fatigue.   He does have appt for monitor today with Windell Moulding at 2:30.  Understands to activate ems if pt becomes worse. Appt. Sch. With dr. Antoine Poche 04/14/11

## 2011-03-27 NOTE — Telephone Encounter (Signed)
Pt feels tired, and weak, had tachycardia while at dr hoppers office yesterday, told to call today if worse to be seen, wife talks so fast and repeats things its rather hard to get info from her, but right now pt in bed doesn't wants to be bothered, cant tell if worse due to only symptoms today is being tired, pls call

## 2011-03-30 ENCOUNTER — Telehealth: Payer: Self-pay | Admitting: *Deleted

## 2011-03-30 ENCOUNTER — Telehealth: Payer: Self-pay | Admitting: Cardiology

## 2011-03-30 DIAGNOSIS — M48061 Spinal stenosis, lumbar region without neurogenic claudication: Secondary | ICD-10-CM | POA: Diagnosis not present

## 2011-03-30 NOTE — Telephone Encounter (Signed)
[facility] Triage Call Report Triage Record Num: [id number] Operator: April Finney Patient Name: Jamichael Knotts Call Date & Time: 03/29/2011 3:47:28PM Patient Phone: [phone] PCP: Marga Melnick Patient Gender: Male PCP Fax : [phone] Patient DOB: 17-Aug-1945 Practice Name: [facility] Reason for Call: Caller: Karen/Father; PCP: Marga Melnick; CB#: [phone]; Call regarding Back Pain onset today. He took an oxycontin and is asleep now. Was unable to stand upright. No neuro changes. See in ED care advice given. Protocol(s) Used: Back Symptoms Recommended Outcome per Protocol: See ED Immediately Reason for Outcome: New onset of severe disabling back pain (unable to stand upright) Care Advice: ~ Protect the patient from falling or other harm. ~ Another adult should drive. ~ Do not give the patient anything to eat or drink. If safe transport is very difficult or not possible, consider calling an ambulance or transport service. This may not be covered by insurance and will be fee-for-service. ~ ~ IMMEDIATE ACTION Write down provider's name. List or place the following in a bag for transport with the patient: current prescription and/or nonprescription medications; alternative treatments, therapies and medications; and street drugs.

## 2011-03-30 NOTE — Telephone Encounter (Signed)
Pt wife states that he did not go to ED but still continue to have lower back and hip pain. Pt has appt with ortho today for evaluation.

## 2011-03-30 NOTE — Telephone Encounter (Signed)
New Problem:    Patient's wife called because she was worried because th patient is weak and tired, had an abnormal EKG last week when he went to see Dr. Alwyn Ren last week and she believes that he is in Afib. She would like him to be seen as soon as possible, ideally would have liked him to be seen today.  Please call back.

## 2011-03-30 NOTE — Telephone Encounter (Signed)
Denies any pain or discomfort, just weakness and fatigue.  Discussed with Pam RN for Dr Antoine Poche and scheduled appointment for tomorrow with Dr Antoine Poche.  She will bring monitor back today so will have results tomorrow at visit.

## 2011-03-31 ENCOUNTER — Ambulatory Visit (INDEPENDENT_AMBULATORY_CARE_PROVIDER_SITE_OTHER): Payer: Medicare Other | Admitting: *Deleted

## 2011-03-31 ENCOUNTER — Encounter: Payer: Self-pay | Admitting: Cardiology

## 2011-03-31 ENCOUNTER — Ambulatory Visit (INDEPENDENT_AMBULATORY_CARE_PROVIDER_SITE_OTHER): Payer: Medicare Other | Admitting: Cardiology

## 2011-03-31 VITALS — BP 119/79 | HR 61 | Ht 74.0 in | Wt 245.0 lb

## 2011-03-31 DIAGNOSIS — I10 Essential (primary) hypertension: Secondary | ICD-10-CM

## 2011-03-31 DIAGNOSIS — G4700 Insomnia, unspecified: Secondary | ICD-10-CM

## 2011-03-31 DIAGNOSIS — Z86718 Personal history of other venous thrombosis and embolism: Secondary | ICD-10-CM

## 2011-03-31 DIAGNOSIS — I4891 Unspecified atrial fibrillation: Secondary | ICD-10-CM | POA: Diagnosis not present

## 2011-03-31 NOTE — Assessment & Plan Note (Signed)
The blood pressure is at target. No change in medications is indicated. We will continue with therapeutic lifestyle changes (TLC).  

## 2011-03-31 NOTE — Patient Instructions (Signed)
Please have blood work today  The current medical regimen is effective;  continue present plan and medications.  Follow up in 4 months with Dr Antoine Poche.  You will receive a letter in the mail 2 months before you are due.  Please call us when you receive this letter to schedule your follow up appointment.

## 2011-03-31 NOTE — Assessment & Plan Note (Signed)
I will pull the Holter strips correlating with the times in his Holter diary when he said he was dizzy. I'm not sure that his fibrillation correlates with the symptoms. I will measure a flecainide level. If this is lower than therapeutic or low-level therapeutic I will likely increase the dose of flecainide, trying to control these brief paroxysms to see if that helps.

## 2011-03-31 NOTE — Assessment & Plan Note (Signed)
He is not sure he's had much improvement with his Ambien. I will defer further treatment to Dr. Alwyn Ren.

## 2011-03-31 NOTE — Progress Notes (Signed)
HPI The patient presented for an evaluation of tachycardia.   Since I last saw him he has worn a monitor to evaluate whether any episodes of weakness or tiredness are related to his paroxysmal atrial fibrillation. He also saw Dr. Alwyn Ren who noted his heart rate to be 109 sinus in the office. I did communicate with Dr. Alwyn Ren about treating him with Ambien as he's not sleeping and is fatigued. He gave him Ambien for every third day.  He still tired. He did wear the monitor and had episodes of tiredness but I haven't correlated these necessarily with his brief runs of atrial fibrillation. He's not describing presyncope or syncope. He's not describing chest pressure, neck or arm discomfort. He's not really noticing any palpitations. He does get episodes of dizziness and significant fatigue. Labs have been extensively evaluated including a normal cortisol level and electrolytes. TSH was normal recently.  Allergies  Allergen Reactions  . Penicillins Anaphylaxis         Current Outpatient Prescriptions  Medication Sig Dispense Refill  . diltiazem (DILACOR XR) 240 MG 24 hr capsule Take 1 capsule (240 mg total) by mouth daily.  90 capsule  3  . flecainide (TAMBOCOR) 100 MG tablet Take 1 tablet (100 mg total) by mouth 2 (two) times daily.  180 tablet  3  . fluticasone (FLOVENT DISKUS) 50 MCG/BLIST diskus inhaler Inhale 1 puff into the lungs 2 (two) times daily.        [name] gabapentin (NEURONTIN) 300 MG capsule Take 300 mg by mouth 4 (four) times daily.       . methocarbamol (ROBAXIN) 500 MG tablet Take 500 mg by mouth as needed.        . metoprolol succinate (TOPROL-XL) 25 MG 24 hr tablet Take 1 tablet (25 mg total) by mouth as needed.  90 tablet  3  . Mometasone Furo-Formoterol Fum (DULERA) 200-5 MCG/ACT AERO Inhale 1-2 puffs into the lungs every 12 (twelve) hours.  8.8 g  5  . omeprazole (PRILOSEC) 20 MG capsule TAKE 1 CAPSULE DAILY  90 capsule  3  . sertraline (ZOLOFT) 50 MG tablet Take 50 mg by mouth  daily.        [name] warfarin (COUMADIN) 3 MG tablet TAKE AS DIRECTED BY COUMADIN CLINIC  144 tablet  2  . zolpidem (AMBIEN) 5 MG tablet 1 by mouth every 3rd night  10 tablet  1    Past Medical History  Diagnosis Date  . BPH (benign prostatic hypertrophy)   . Spinal stenosis     congential  . Hypertension   . Asthma   . History of blood clots     in legs and lungs following back surgery in 2010  . Allergic rhinitis     Past Surgical History  Procedure Date  . Vasectomy   . Lumbar fusion     x2  . Total hip arthroplasty     bilat    ROS:  As stated in the HPI and negative for all other systems.  PHYSICAL EXAM BP 119/79  Pulse 61  Ht 6\' 2"  (1.88 m)  Wt 245 lb (111.131 kg)  BMI 31.46 kg/m2 GENERAL:  Well appearing HEENT:  Pupils equal round and reactive, fundi not visualized, oral mucosa unremarkable NECK:  No jugular venous distention, waveform within normal limits, carotid upstroke brisk and symmetric, no bruits, no thyromegaly LYMPHATICS:  No cervical, inguinal adenopathy LUNGS:  Clear to auscultation bilaterally BACK:  No CVA tenderness CHEST:  Unremarkable HEART:  PMI not displaced or sustained,S1 and S2 within normal limits, no S3, no S4, no clicks, no rubs, no murmurs ABD:  Flat, positive bowel sounds normal in frequency in pitch, no bruits, no rebound, no guarding, no midline pulsatile mass, no hepatomegaly, no splenomegaly, obese EXT:  2 plus pulses throughout, no edema, no cyanosis no clubbing SKIN:  No rashes no nodules NEURO:  Cranial nerves II through XII grossly intact, motor grossly intact throughout PSYCH:  Cognitively intact, oriented to person place and time  EKG:  Sinus rhythm, rate 83, axis within normal limits, intervals within normal limits, no acute ST-T wave changes.  03/31/2011   ASSESSMENT AND PLAN

## 2011-04-02 DIAGNOSIS — M48061 Spinal stenosis, lumbar region without neurogenic claudication: Secondary | ICD-10-CM | POA: Diagnosis not present

## 2011-04-06 NOTE — Telephone Encounter (Signed)
New Msg: pt calling wanting to speak with nurse/MD regarding results. Please return pt call to discuss further.

## 2011-04-06 NOTE — Telephone Encounter (Signed)
Mr Cloke wants to know if Dr Antoine Poche pulled the strips from his monitor to compare it with his symptoms?

## 2011-04-07 DIAGNOSIS — M5137 Other intervertebral disc degeneration, lumbosacral region: Secondary | ICD-10-CM | POA: Diagnosis not present

## 2011-04-07 NOTE — Telephone Encounter (Signed)
I did review the strips.  He is having PAF and it likely correlates.  I am awaiting the Flecainide level to make a decision about increasing the dose to control symptoms.

## 2011-04-08 ENCOUNTER — Other Ambulatory Visit: Payer: Self-pay | Admitting: Cardiology

## 2011-04-08 ENCOUNTER — Ambulatory Visit: Payer: Medicare Other | Admitting: *Deleted

## 2011-04-08 DIAGNOSIS — I10 Essential (primary) hypertension: Secondary | ICD-10-CM | POA: Diagnosis not present

## 2011-04-08 DIAGNOSIS — E785 Hyperlipidemia, unspecified: Secondary | ICD-10-CM | POA: Diagnosis not present

## 2011-04-08 DIAGNOSIS — I4891 Unspecified atrial fibrillation: Secondary | ICD-10-CM | POA: Diagnosis not present

## 2011-04-08 DIAGNOSIS — D126 Benign neoplasm of colon, unspecified: Secondary | ICD-10-CM | POA: Diagnosis not present

## 2011-04-08 DIAGNOSIS — I428 Other cardiomyopathies: Secondary | ICD-10-CM | POA: Diagnosis not present

## 2011-04-08 NOTE — Telephone Encounter (Signed)
Pt aware Flecainide level did not get ran and he will come in for repeat blood work.

## 2011-04-14 ENCOUNTER — Telehealth: Payer: Self-pay | Admitting: Cardiology

## 2011-04-14 ENCOUNTER — Ambulatory Visit: Payer: Medicare Other | Admitting: Cardiology

## 2011-04-14 DIAGNOSIS — M5137 Other intervertebral disc degeneration, lumbosacral region: Secondary | ICD-10-CM | POA: Diagnosis not present

## 2011-04-14 NOTE — Telephone Encounter (Signed)
Per lab - fleccanide level is a send out to [facility] - results will not be ready until Monday of next week.  pts wife is aware.

## 2011-04-14 NOTE — Telephone Encounter (Signed)
PT HAD BLOOD WORK DONE, WANTS RESULTS, AND WHAT THE NEXT STEP IS?

## 2011-04-22 ENCOUNTER — Telehealth: Payer: Self-pay | Admitting: Cardiology

## 2011-04-22 DIAGNOSIS — M5137 Other intervertebral disc degeneration, lumbosacral region: Secondary | ICD-10-CM | POA: Diagnosis not present

## 2011-04-22 LAB — FLECAINIDE LEVEL

## 2011-04-22 NOTE — Telephone Encounter (Signed)
Flecainide level not back yet.

## 2011-04-22 NOTE — Telephone Encounter (Signed)
FU Call: pt wife call wanting to know results of pt lab. Please return pt wife call to discuss further.

## 2011-04-23 NOTE — Telephone Encounter (Signed)
Flecainide level back and is 1.8  Increase dosage to 150 per Dr Antoine Poche.  Left message on voicemail to call back

## 2011-04-24 ENCOUNTER — Ambulatory Visit: Payer: Medicare Other | Admitting: Cardiology

## 2011-04-24 MED ORDER — FLECAINIDE ACETATE 150 MG PO TABS: 150.0000 mg | ORAL_TABLET | Freq: Two times a day (BID) | ORAL | Status: DC

## 2011-04-24 NOTE — Telephone Encounter (Signed)
FU Call: Pt returning call to [facility]. Please return pt call to discuss further.

## 2011-04-24 NOTE — Telephone Encounter (Signed)
Left message again for pt that level is 1.8 and should 2.0.  Pt is to increase to 150 mg  -  Sent to [facility]

## 2011-04-24 NOTE — Telephone Encounter (Signed)
FU Call: pt returning call to [facility]. Please return pt call to discuss further.

## 2011-04-24 NOTE — Telephone Encounter (Signed)
FU call: Pt returning call to [facility]. Please return pt call to discuss further.

## 2011-04-28 ENCOUNTER — Ambulatory Visit (INDEPENDENT_AMBULATORY_CARE_PROVIDER_SITE_OTHER): Payer: Medicare Other | Admitting: Pharmacist

## 2011-04-28 DIAGNOSIS — Z86718 Personal history of other venous thrombosis and embolism: Secondary | ICD-10-CM

## 2011-04-28 DIAGNOSIS — I4891 Unspecified atrial fibrillation: Secondary | ICD-10-CM | POA: Diagnosis not present

## 2011-04-28 LAB — POCT INR: INR: 3.1

## 2011-04-28 NOTE — Patient Instructions (Signed)
Pt requests 5 weeks instead of 4 weeks due to traveling.  Pt knows anticoagulation risks of not coming to clinic sooner.

## 2011-04-29 DIAGNOSIS — M259 Joint disorder, unspecified: Secondary | ICD-10-CM | POA: Diagnosis not present

## 2011-05-27 DIAGNOSIS — S92919A Unspecified fracture of unspecified toe(s), initial encounter for closed fracture: Secondary | ICD-10-CM | POA: Diagnosis not present

## 2011-06-01 ENCOUNTER — Encounter: Payer: Self-pay | Admitting: Gastroenterology

## 2011-06-08 ENCOUNTER — Ambulatory Visit (INDEPENDENT_AMBULATORY_CARE_PROVIDER_SITE_OTHER): Payer: Medicare Other | Admitting: *Deleted

## 2011-06-08 DIAGNOSIS — I4891 Unspecified atrial fibrillation: Secondary | ICD-10-CM

## 2011-06-08 DIAGNOSIS — Z86718 Personal history of other venous thrombosis and embolism: Secondary | ICD-10-CM

## 2011-06-12 ENCOUNTER — Encounter: Payer: Self-pay | Admitting: Internal Medicine

## 2011-06-12 ENCOUNTER — Ambulatory Visit (INDEPENDENT_AMBULATORY_CARE_PROVIDER_SITE_OTHER): Payer: Medicare Other | Admitting: Internal Medicine

## 2011-06-12 VITALS — BP 130/72 | HR 64 | Temp 98.0°F | Wt 262.0 lb

## 2011-06-12 DIAGNOSIS — M79609 Pain in unspecified limb: Secondary | ICD-10-CM

## 2011-06-12 DIAGNOSIS — S92919A Unspecified fracture of unspecified toe(s), initial encounter for closed fracture: Secondary | ICD-10-CM

## 2011-06-12 DIAGNOSIS — M79675 Pain in left toe(s): Secondary | ICD-10-CM

## 2011-06-12 NOTE — Progress Notes (Signed)
  Subjective:    Patient ID: [name] [name], male    DOB: [birth year], 66 y.o.   MRN: [id number]  HPI Approximally 17 days ago he struck the left toes against the bed while visiting family in [place], [place]. The next day he drove to to [facility] to see other family members and was seen in urgent care facility because of severe pain. He was told that he had one fracture in the second left toe and 2 fractures of the third toe. He was provided a walking boot and hydrocodone.  He is continuing to have swelling and some residual  ecchymosis in this area. He denies pain at rest or with  foot elevated. With any pressure, despite wearing a walking shoe, he has throbbing pain in the third toe. He does not feel hydrocodone has been that beneficial.  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 He has no history of osteoporosis        Review of Systems   He denies fever, chills, sweats or weight loss. He has had no erythema emanating proximally from the injured area.     Objective:   Physical Exam He appears healthy and well-nourished in no acute distress. He does have pain with any pressure on the second left toe.  There is faint erythema at the base of the second and third toes on the left which blanches with pressure.  There is a regular ecchymosis of the third left toe.  Pain cannot be elicited by flexion of the second and third toes but he is exquisitely tender at the base of the stairs.  He has chronic deformity of the left great nail.  Pedal pulses are intact  Homans sign is negative       Assessment & Plan:  #1 residual pain with pressure at site of 3 toe fractures. Surprisingly he has not been hydrocodone affective. He is also afraid it may in his gait. The time interval mitigates against reflex dystrophy. He may have an unstable fracture which may require a specialty orthopedic intervention. He may need to use crutches or other means to immobilize the  left foot for extended period of time Plan: See orders and recommendations

## 2011-06-12 NOTE — Patient Instructions (Signed)
.  Share record  with [facility]

## 2011-06-16 DIAGNOSIS — S92919A Unspecified fracture of unspecified toe(s), initial encounter for closed fracture: Secondary | ICD-10-CM | POA: Diagnosis not present

## 2011-06-24 DIAGNOSIS — M2550 Pain in unspecified joint: Secondary | ICD-10-CM | POA: Diagnosis not present

## 2011-06-24 DIAGNOSIS — M48061 Spinal stenosis, lumbar region without neurogenic claudication: Secondary | ICD-10-CM | POA: Diagnosis not present

## 2011-06-24 DIAGNOSIS — M4150 Other secondary scoliosis, site unspecified: Secondary | ICD-10-CM | POA: Diagnosis not present

## 2011-06-24 DIAGNOSIS — Z981 Arthrodesis status: Secondary | ICD-10-CM | POA: Diagnosis not present

## 2011-06-24 DIAGNOSIS — M47817 Spondylosis without myelopathy or radiculopathy, lumbosacral region: Secondary | ICD-10-CM | POA: Diagnosis not present

## 2011-07-02 DIAGNOSIS — M48061 Spinal stenosis, lumbar region without neurogenic claudication: Secondary | ICD-10-CM | POA: Diagnosis not present

## 2011-07-02 DIAGNOSIS — K432 Incisional hernia without obstruction or gangrene: Secondary | ICD-10-CM | POA: Diagnosis not present

## 2011-07-02 DIAGNOSIS — M47817 Spondylosis without myelopathy or radiculopathy, lumbosacral region: Secondary | ICD-10-CM | POA: Diagnosis not present

## 2011-07-02 DIAGNOSIS — M1610 Unilateral primary osteoarthritis, unspecified hip: Secondary | ICD-10-CM | POA: Diagnosis not present

## 2011-07-02 DIAGNOSIS — R109 Unspecified abdominal pain: Secondary | ICD-10-CM | POA: Diagnosis not present

## 2011-07-02 DIAGNOSIS — M4150 Other secondary scoliosis, site unspecified: Secondary | ICD-10-CM | POA: Diagnosis not present

## 2011-07-06 ENCOUNTER — Ambulatory Visit (INDEPENDENT_AMBULATORY_CARE_PROVIDER_SITE_OTHER): Payer: Medicare Other

## 2011-07-06 DIAGNOSIS — Z86718 Personal history of other venous thrombosis and embolism: Secondary | ICD-10-CM

## 2011-07-06 DIAGNOSIS — I4891 Unspecified atrial fibrillation: Secondary | ICD-10-CM | POA: Diagnosis not present

## 2011-07-06 LAB — POCT INR: INR: 3

## 2011-07-27 ENCOUNTER — Other Ambulatory Visit: Payer: Self-pay

## 2011-07-27 MED ORDER — WARFARIN SODIUM 3 MG PO TABS: ORAL_TABLET | ORAL | Status: DC

## 2011-07-28 ENCOUNTER — Other Ambulatory Visit: Payer: Self-pay | Admitting: Pharmacist

## 2011-07-29 ENCOUNTER — Encounter: Payer: Self-pay | Admitting: Gastroenterology

## 2011-08-04 ENCOUNTER — Ambulatory Visit (INDEPENDENT_AMBULATORY_CARE_PROVIDER_SITE_OTHER): Payer: Medicare Other

## 2011-08-04 DIAGNOSIS — I4891 Unspecified atrial fibrillation: Secondary | ICD-10-CM | POA: Diagnosis not present

## 2011-08-04 DIAGNOSIS — Z86718 Personal history of other venous thrombosis and embolism: Secondary | ICD-10-CM

## 2011-08-04 LAB — POCT INR: INR: 2.3

## 2011-08-11 ENCOUNTER — Other Ambulatory Visit: Payer: Self-pay | Admitting: Internal Medicine

## 2011-08-11 MED ORDER — SERTRALINE HCL 50 MG PO TABS: 50.0000 mg | ORAL_TABLET | Freq: Every day | ORAL | Status: DC

## 2011-08-11 NOTE — Telephone Encounter (Signed)
Refill sertraline Tab 50MG  Requesting 90-day supply Take one tablet daily Last wrt. 8.23.12 Last ov 4.5.13

## 2011-08-25 ENCOUNTER — Telehealth: Payer: Self-pay | Admitting: *Deleted

## 2011-08-25 ENCOUNTER — Ambulatory Visit (INDEPENDENT_AMBULATORY_CARE_PROVIDER_SITE_OTHER): Payer: Medicare Other | Admitting: Gastroenterology

## 2011-08-25 ENCOUNTER — Encounter: Payer: Self-pay | Admitting: Gastroenterology

## 2011-08-25 VITALS — BP 124/72 | HR 72 | Ht 74.0 in | Wt 266.0 lb

## 2011-08-25 DIAGNOSIS — K5120 Ulcerative (chronic) proctitis without complications: Secondary | ICD-10-CM

## 2011-08-25 DIAGNOSIS — D126 Benign neoplasm of colon, unspecified: Secondary | ICD-10-CM | POA: Diagnosis not present

## 2011-08-25 DIAGNOSIS — Z800 Family history of malignant neoplasm of digestive organs: Secondary | ICD-10-CM

## 2011-08-25 HISTORY — DX: Ulcerative (chronic) proctitis without complications: K51.20

## 2011-08-25 NOTE — Progress Notes (Signed)
History of Present Illness: Ms. Kracht is a pleasant 66 year old white male with history of atrial fibrillation, on Coumadin, and history of DVT and pulmonary emboli following back surgery in 2010 , referred for colonoscopy. Family history is pertinent for his father who had colon cancer. Last colonoscopy in 2008 demonstrated diverticulosis. Adenomatous polyps were removed in 2004. Proctitis was seen by sigmoidoscopy in 2006. He has no GI complaints including change in bowel habits, abdominal pain, melena or hematochezia.    Past Medical History  Diagnosis Date  . BPH (benign prostatic hypertrophy)   . Spinal stenosis     congential  . Hypertension   . Asthma   . History of blood clots     in legs and lungs following back surgery in 2010  . Allergic rhinitis   . Atrial fibrillation    Past Surgical History  Procedure Date  . Vasectomy   . Lumbar fusion     x2  . Total hip arthroplasty     bilat   family history includes Alcohol abuse in his brother; COPD in his mother; Cancer in his father; Heart attack in his brother; and Heart disease in his brothers, father, and mother. Current Outpatient Prescriptions  Medication Sig Dispense Refill  . diltiazem (DILACOR XR) 240 MG 24 hr capsule Take 1 capsule (240 mg total) by mouth daily.  90 capsule  3  . flecainide (TAMBOCOR) 150 MG tablet Take 1 tablet (150 mg total) by mouth 2 (two) times daily.  180 tablet  3  . metoprolol succinate (TOPROL-XL) 25 MG 24 hr tablet Take 1 tablet (25 mg total) by mouth as needed.  90 tablet  3  . omeprazole (PRILOSEC) 20 MG capsule TAKE 1 CAPSULE DAILY  90 capsule  3  . sertraline (ZOLOFT) 50 MG tablet Take 1 tablet (50 mg total) by mouth daily.  90 tablet  1  . warfarin (COUMADIN) 3 MG tablet Take as directed by anticoagulation clinic  160 tablet  1  . DISCONTD: sertraline (ZOLOFT) 50 MG tablet Take 1 tablet (50 mg total) by mouth daily.  90 tablet  2   Allergies as of 08/25/2011 - Review Complete  08/25/2011  Allergen Reaction Noted  . Penicillins Anaphylaxis 04/23/2006    reports that he quit smoking about 11 years ago. His smoking use included Cigarettes. He has a 80 pack-year smoking history. He has never used smokeless tobacco. He reports that he drinks alcohol. He reports that he does not use illicit drugs.     Review of Systems: He has occasional low back pain Pertinent positive and negative review of systems were noted in the above HPI section. All other review of systems were otherwise negative.  Vital signs were reviewed in today's medical record Physical Exam: General: Well developed , well nourished, no acute distress Head: Normocephalic and atraumatic Eyes:  sclerae anicteric, EOMI Ears: Normal auditory acuity Mouth: No deformity or lesions Neck: Supple, no masses or thyromegaly Lungs: Clear throughout to auscultation Heart: Regular rate and rhythm; no murmurs, rubs or bruits Abdomen: Soft, non tender and non distended. No masses, hepatosplenomegaly or hernias noted. Normal Bowel sounds Rectal:deferred Musculoskeletal: Symmetrical with no gross deformities  Skin: No lesions on visible extremities Pulses:  Normal pulses noted Extremities: No clubbing, cyanosis, edema or deformities noted Neurological: Alert oriented x 4, grossly nonfocal Cervical Nodes:  No significant cervical adenopathy Inguinal Nodes: No significant inguinal adenopathy Psychological:  Alert and cooperative. Normal mood and affect

## 2011-08-25 NOTE — Telephone Encounter (Signed)
[facility] [address] [place] [address] [phone] Phone [phone] Fax  08/25/2011    RE: [name] [name] DOB: June 23, 1945 MRN: [id number]   Dear  Dr Antoine Poche,     We have scheduled the above patient for an endoscopic procedure. Our records show that he is on anticoagulation therapy.   Please advise as to how long the patient may come off his therapy of  Coumadin  prior to the procedure, which is scheduled for 09/18/2011  Please fax back/ or route the completed form to Sharetta Ricchio at [phone]  Sincerely,  Merri Ray

## 2011-08-25 NOTE — Patient Instructions (Addendum)
You will be contaced by our office prior to your procedure for directions on holding your Coumadin/Warfarin.  If you do not hear from our office 1 week prior to your scheduled procedure, please call [phone] to discuss.  Colonoscopy A colonoscopy is an exam to evaluate your entire colon. In this exam, your colon is cleansed. A long fiberoptic tube is inserted through your rectum and into your colon. The fiberoptic scope (endoscope) is a long bundle of enclosed and very flexible fibers. These fibers transmit light to the area examined and send images from that area to your caregiver. Discomfort is usually minimal. You may be given a drug to help you sleep (sedative) during or prior to the procedure. This exam helps to detect lumps (tumors), polyps, inflammation, and areas of bleeding. Your caregiver may also take a small piece of tissue (biopsy) that will be examined under a microscope. LET YOUR CAREGIVER KNOW ABOUT:   Allergies to food or medicine.   Medicines taken, including vitamins, herbs, eyedrops, over-the-counter medicines, and creams.   Use of steroids (by mouth or creams).   Previous problems with anesthetics or numbing medicines.   History of bleeding problems or blood clots.   Previous surgery.   Other health problems, including diabetes and kidney problems.   Possibility of pregnancy, if this applies.  BEFORE THE PROCEDURE   A clear liquid diet may be required for 2 days before the exam.   Ask your caregiver about changing or stopping your regular medications.   Liquid injections (enemas) or laxatives may be required.   A large amount of electrolyte solution may be given to you to drink over a short period of time. This solution is used to clean out your colon.   You should be present 60 minutes prior to your procedure or as directed by your caregiver.  AFTER THE PROCEDURE   If you received a sedative or pain relieving medication, you will need to arrange for someone  to drive you home.   Occasionally, there is a little blood passed with the first bowel movement. Do not be concerned.  FINDING OUT THE RESULTS OF YOUR TEST Not all test results are available during your visit. If your test results are not back during the visit, make an appointment with your caregiver to find out the results. Do not assume everything is normal if you have not heard from your caregiver or the medical facility. It is important for you to follow up on all of your test results. HOME CARE INSTRUCTIONS   It is not unusual to pass moderate amounts of gas and experience mild abdominal cramping following the procedure. This is due to air being used to inflate your colon during the exam. Walking or a warm pack on your belly (abdomen) may help.   You may resume all normal meals and activities after sedatives and medicines have worn off.   Only take over-the-counter or prescription medicines for pain, discomfort, or fever as directed by your caregiver. Do not use aspirin or blood thinners if a biopsy was taken. Consult your caregiver for medicine usage if biopsies were taken.  SEEK IMMEDIATE MEDICAL CARE IF:   You have a fever.   You pass large blood clots or fill a toilet with blood following the procedure. This may also occur 10 to 14 days following the procedure. This is more likely if a biopsy was taken.   You develop abdominal pain that keeps getting worse and cannot be relieved with medicine.    Document Released: 02/21/2000 Document Revised: 02/12/2011 Document Reviewed: 10/06/2007 [employer] Patient Information 2012 [employer]. 

## 2011-08-25 NOTE — Assessment & Plan Note (Signed)
Continue surveillance colonoscopy every 5 years 

## 2011-08-25 NOTE — Assessment & Plan Note (Signed)
Asymptomatic. 

## 2011-08-25 NOTE — Assessment & Plan Note (Signed)
Plan colonoscopy. Coumadin will be held in advance of the procedure if permitted by cardiology

## 2011-08-28 NOTE — Telephone Encounter (Signed)
The patient may stop his warfarin as needed (5 to 7 days) for the planned procedure.

## 2011-09-01 ENCOUNTER — Ambulatory Visit (INDEPENDENT_AMBULATORY_CARE_PROVIDER_SITE_OTHER): Payer: Medicare Other | Admitting: *Deleted

## 2011-09-01 DIAGNOSIS — Z86718 Personal history of other venous thrombosis and embolism: Secondary | ICD-10-CM | POA: Diagnosis not present

## 2011-09-01 DIAGNOSIS — I4891 Unspecified atrial fibrillation: Secondary | ICD-10-CM

## 2011-09-02 NOTE — Telephone Encounter (Signed)
Pt and wife aware OK to hold as needed 5 to 7 days.

## 2011-09-07 HISTORY — PX: OTHER SURGICAL HISTORY: SHX169

## 2011-09-15 ENCOUNTER — Telehealth: Payer: Self-pay | Admitting: Gastroenterology

## 2011-09-15 MED ORDER — MOVIPREP 100 G PO SOLR: 1.0000 | Freq: Once | ORAL | Status: DC

## 2011-09-15 NOTE — Telephone Encounter (Signed)
Called pt to inform moviprep was sent

## 2011-09-18 ENCOUNTER — Ambulatory Visit (AMBULATORY_SURGERY_CENTER): Payer: Medicare Other | Admitting: Gastroenterology

## 2011-09-18 ENCOUNTER — Encounter: Payer: Self-pay | Admitting: Gastroenterology

## 2011-09-18 VITALS — BP 120/76 | HR 62 | Temp 95.4°F | Resp 18 | Ht 74.0 in | Wt 266.0 lb

## 2011-09-18 DIAGNOSIS — D126 Benign neoplasm of colon, unspecified: Secondary | ICD-10-CM

## 2011-09-18 DIAGNOSIS — J449 Chronic obstructive pulmonary disease, unspecified: Secondary | ICD-10-CM | POA: Diagnosis not present

## 2011-09-18 DIAGNOSIS — N400 Enlarged prostate without lower urinary tract symptoms: Secondary | ICD-10-CM | POA: Diagnosis not present

## 2011-09-18 DIAGNOSIS — Z1211 Encounter for screening for malignant neoplasm of colon: Secondary | ICD-10-CM

## 2011-09-18 DIAGNOSIS — I4891 Unspecified atrial fibrillation: Secondary | ICD-10-CM | POA: Diagnosis not present

## 2011-09-18 DIAGNOSIS — Z800 Family history of malignant neoplasm of digestive organs: Secondary | ICD-10-CM

## 2011-09-18 DIAGNOSIS — Z860100 Personal history of colon polyps, unspecified: Secondary | ICD-10-CM

## 2011-09-18 DIAGNOSIS — K635 Polyp of colon: Secondary | ICD-10-CM

## 2011-09-18 DIAGNOSIS — M4800 Spinal stenosis, site unspecified: Secondary | ICD-10-CM | POA: Diagnosis not present

## 2011-09-18 DIAGNOSIS — K633 Ulcer of intestine: Secondary | ICD-10-CM | POA: Diagnosis not present

## 2011-09-18 DIAGNOSIS — I10 Essential (primary) hypertension: Secondary | ICD-10-CM | POA: Diagnosis not present

## 2011-09-18 MED ORDER — SODIUM CHLORIDE 0.9 % IV SOLN: 500.0000 mL | INTRAVENOUS | Status: DC

## 2011-09-18 NOTE — Progress Notes (Signed)
Patient did not experience any of the following events: a burn prior to discharge; a fall within the facility; wrong site/side/patient/procedure/implant event; or a hospital transfer or hospital admission upon discharge from the facility. (G8907) Patient did not have preoperative order for IV antibiotic SSI prophylaxis. (G8918) Patient did not have preoperative order for IV antibiotic SSI prophylaxis. (G8918)  

## 2011-09-18 NOTE — Op Note (Signed)
[facility] [address] [place], [place]  [address]  COLONOSCOPY PROCEDURE REPORT  PATIENT:  [name], [name]  MR#:  [id number] BIRTHDATE:  [birth year], 66 yrs. old  GENDER:  male ENDOSCOPIST:  Barbette Hair. Arlyce Dice, MD REF. BY: PROCEDURE DATE:  09/18/2011 PROCEDURE:  Colon with cold biopsy polypectomy ASA CLASS:  Class II INDICATIONS:  Screening, history of pre-cancerous (adenomatous) colon polyps, family history of colon cancer Index polypectomy 2004 Parent with colon cancer MEDICATIONS:   MAC sedation, administered by CRNA propofol 250mg IV  DESCRIPTION OF PROCEDURE:   After the risks benefits and alternatives of the procedure were thoroughly explained, informed consent was obtained.  Digital rectal exam was performed and revealed no abnormalities.   The LB [id number] endoscope was introduced through the anus and advanced to the cecum, which was identified by the ileocecal valve, limited by poor preparation.  Large amount of retained liquid stool stool  The quality of the prep was poor, using MoviPrep.  The instrument was then slowly withdrawn as the colon was fully examined. <<PROCEDUREIMAGES>>  FINDINGS:  Two polyps were found in the cecum (see image2 and image4). 2 2mm polyps - removed with cold biopsy forceps, submitted to pathology  This was otherwise a normal examination of the colon (see image5 and image6). EXAM LIMITED DUE TO POOR PREP Retroflexed views in the rectum revealed no abnormalities.    The time to cecum =  1) 4.25  minutes. The scope was then withdrawn in 1) 12.75  minutes from the cecum and the procedure completed. COMPLICATIONS:  None ENDOSCOPIC IMPRESSION: 1) Two polyps in the cecum 2) Otherwise normal examination RECOMMENDATIONS: 1) Colonoscopy in 3 years due to limitation of exam REPEAT EXAM:  In 3 year(s) for Colonoscopy.  ______________________________ Barbette Hair. Arlyce Dice, MD  CC:  [name] Lawless, MD  n. [name] [name]:   Barbette Hair.  [name] [name] at 09/18/2011 03:53 PM  [name] [name], [phone]

## 2011-09-18 NOTE — Patient Instructions (Addendum)

## 2011-09-21 ENCOUNTER — Telehealth: Payer: Self-pay | Admitting: *Deleted

## 2011-09-21 NOTE — Telephone Encounter (Signed)
NO ANSWER, MESSAGE LEFT FOR THE PATIENT. 

## 2011-09-28 ENCOUNTER — Encounter: Payer: Self-pay | Admitting: Gastroenterology

## 2011-09-29 ENCOUNTER — Ambulatory Visit (INDEPENDENT_AMBULATORY_CARE_PROVIDER_SITE_OTHER): Payer: Medicare Other | Admitting: *Deleted

## 2011-09-29 DIAGNOSIS — Z86718 Personal history of other venous thrombosis and embolism: Secondary | ICD-10-CM

## 2011-09-29 DIAGNOSIS — I4891 Unspecified atrial fibrillation: Secondary | ICD-10-CM

## 2011-09-30 ENCOUNTER — Ambulatory Visit: Payer: Medicare Other | Admitting: Internal Medicine

## 2011-10-01 ENCOUNTER — Ambulatory Visit: Payer: Medicare Other | Admitting: Internal Medicine

## 2011-10-06 ENCOUNTER — Telehealth: Payer: Self-pay | Admitting: Internal Medicine

## 2011-10-06 MED ORDER — OMEPRAZOLE 20 MG PO CPDR: DELAYED_RELEASE_CAPSULE | ORAL | Status: DC

## 2011-10-06 NOTE — Telephone Encounter (Signed)
Refill: Omeprazole cap 20mg . Take 1 capsule daily. 90 day supply

## 2011-10-06 NOTE — Telephone Encounter (Signed)
RX sent

## 2011-10-12 ENCOUNTER — Ambulatory Visit (INDEPENDENT_AMBULATORY_CARE_PROVIDER_SITE_OTHER): Payer: Medicare Other | Admitting: Internal Medicine

## 2011-10-12 ENCOUNTER — Encounter: Payer: Self-pay | Admitting: Internal Medicine

## 2011-10-12 VITALS — BP 122/80 | HR 61 | Temp 98.5°F | Wt 265.0 lb

## 2011-10-12 DIAGNOSIS — G4700 Insomnia, unspecified: Secondary | ICD-10-CM

## 2011-10-12 MED ORDER — LORAZEPAM 0.5 MG PO TABS: ORAL_TABLET | ORAL | Status: DC

## 2011-10-12 NOTE — Patient Instructions (Addendum)
To prevent sleep dysfunction follow these instructions for sleep hygiene. Do not read, watch TV, or eat in bed. Do not get into bed until you are ready to turn off the light &  to go to sleep. Do not ingest stimulants ( decongestants, diet pills, nicotine, caffeine) after the evening meal.  

## 2011-10-12 NOTE — Progress Notes (Signed)
  Subjective:    Patient ID: [name] [name], male    DOB: [birth year], 66 y.o.   MRN: [id number]  HPI He now describes sleep dysfunction as difficulty going to sleep with frequent awakenings. Remotely he was evaluated for possible sleep apnea because of snoring. He believes this study was done in 2008 or before and was negative for sleep apnea. He remembers no other specific diagnosis or treatment. He has used his wife's lorazepam with good response. He denies anxiety is a component in his insomnia. Lunesta helped him sleep but caused profound sleepiness the next day.  He does read, listen to the news, or use a computer in bed for a few minutes before attempting to go to sleep. He also does take daytime naps on occasion.    Review of Systems  He denies nightmares or abnormal leg movements. He does not have excessive daytime sleepiness which impairs mental function or motor function such as driving. He denies using stimulants or alcohol as possible factors for his insomnia.        Objective:   Physical Exam General appearance:good health ;well nourished; no acute distress or increased work of breathing is present.  No  lymphadenopathy about the head, neck, or axilla noted.   Eyes: No conjunctival inflammation or lid edema is present.EOMI ; no lid lag   Nose:  External nasal examination shows no deformity or inflammation. Nasal mucosa are pink and moist without lesions or exudates. No septal dislocation or deviation.No obstruction to airflow.   Oral exam: Dental hygiene is good; lips and gums are healthy appearing.There is some oropharyngeal crowding. Uvula elogated  Neck:  No deformities, thyromegaly, masses, or tenderness noted.   Supple with full range of motion without pain. No UAO with hyperventilation  Heart:  Normal rate and regular rhythm. S1 and S2 normal without gallop, murmur, click, rub or other extra sounds.   Lungs:Chest clear to auscultation; no wheezes, rhonchi,rales ,or rubs  present.No increased work of breathing.    Extremities:  No cyanosis, edema, or clubbing  noted    Skin: Warm & dry           Assessment & Plan:  #1 sleep dysfunction; responsive to lorazepam. Some suboptimal sleep hygiene measures should be addressed. Historically no sleep apnea present.  Plan: See orders and recommendations

## 2011-10-13 ENCOUNTER — Ambulatory Visit (INDEPENDENT_AMBULATORY_CARE_PROVIDER_SITE_OTHER): Payer: Medicare Other | Admitting: *Deleted

## 2011-10-13 DIAGNOSIS — I4891 Unspecified atrial fibrillation: Secondary | ICD-10-CM | POA: Diagnosis not present

## 2011-10-13 DIAGNOSIS — Z86718 Personal history of other venous thrombosis and embolism: Secondary | ICD-10-CM

## 2011-10-13 LAB — POCT INR: INR: 3.7

## 2011-11-02 ENCOUNTER — Telehealth: Payer: Self-pay | Admitting: Cardiology

## 2011-11-02 NOTE — Telephone Encounter (Addendum)
Pt out of town and out of med,  needs refill of flecanide #30,  uses [phone], pt [phone]

## 2011-11-02 NOTE — Telephone Encounter (Signed)
called patient to let him know that rx had been [facility] [place], also called [facility] to give rx Flecainide 150 mg bid

## 2011-11-18 DIAGNOSIS — M549 Dorsalgia, unspecified: Secondary | ICD-10-CM | POA: Diagnosis not present

## 2011-11-18 DIAGNOSIS — M545 Low back pain, unspecified: Secondary | ICD-10-CM | POA: Diagnosis not present

## 2011-11-18 DIAGNOSIS — I4891 Unspecified atrial fibrillation: Secondary | ICD-10-CM | POA: Diagnosis not present

## 2011-11-23 DIAGNOSIS — J069 Acute upper respiratory infection, unspecified: Secondary | ICD-10-CM | POA: Diagnosis not present

## 2011-12-03 ENCOUNTER — Other Ambulatory Visit: Payer: Self-pay | Admitting: Internal Medicine

## 2011-12-04 NOTE — Telephone Encounter (Signed)
Rx sent.    MW 

## 2011-12-07 ENCOUNTER — Telehealth: Payer: Self-pay | Admitting: Internal Medicine

## 2011-12-07 NOTE — Telephone Encounter (Signed)
Dr.Hopper please advise 

## 2011-12-07 NOTE — Telephone Encounter (Signed)
Patient states he would like Dr. Alwyn Ren to recommend/refer him to a hearing specialist and a neurosurgeon who specializes in back problems. He states the Dr he saw at [facility] is too far away.

## 2011-12-07 NOTE — Telephone Encounter (Signed)
Dr Dossie Arbour , Audiologist with Dr Lazarus Salines. The local Neurosurgery Specialist will  require an updated, current  assessment and written note from the Primary Care physician  to review before they  schedule an appointment to assess symptoms or problems. As we do not have such  a current  assessment of your health issue or complaint in the chart (electronic medical record);you will need to  make an appointment to create this document THEY REQUIRE. It will be necessary to know prior evaluations and treatments of this symptom and response to these interventions. Please bring that medical history & all medications & supplements to that appointment so I can complete the required document. Thanks

## 2011-12-07 NOTE — Telephone Encounter (Signed)
[place] please see note; schedule appt to summarize N-S status to allow referral

## 2011-12-08 NOTE — Telephone Encounter (Signed)
Pt given name of audiologist. Pt scheduled for appt this week to get neuro referral.

## 2011-12-09 ENCOUNTER — Ambulatory Visit (INDEPENDENT_AMBULATORY_CARE_PROVIDER_SITE_OTHER): Payer: Medicare Other | Admitting: Pharmacist

## 2011-12-09 DIAGNOSIS — I4891 Unspecified atrial fibrillation: Secondary | ICD-10-CM

## 2011-12-09 DIAGNOSIS — Z86718 Personal history of other venous thrombosis and embolism: Secondary | ICD-10-CM

## 2011-12-09 LAB — POCT INR: INR: 4.1

## 2011-12-10 ENCOUNTER — Ambulatory Visit (INDEPENDENT_AMBULATORY_CARE_PROVIDER_SITE_OTHER): Payer: Medicare Other | Admitting: Internal Medicine

## 2011-12-10 ENCOUNTER — Encounter: Payer: Self-pay | Admitting: Internal Medicine

## 2011-12-10 VITALS — BP 122/75 | HR 67 | Wt 268.6 lb

## 2011-12-10 DIAGNOSIS — M545 Low back pain: Secondary | ICD-10-CM | POA: Diagnosis not present

## 2011-12-10 DIAGNOSIS — G8929 Other chronic pain: Secondary | ICD-10-CM | POA: Diagnosis not present

## 2011-12-10 NOTE — Progress Notes (Signed)
  Subjective:    Patient ID: [name] [name], male    DOB: Oct 20, 1945, 66 y.o.   MRN: [id number]  HPI Previous hx of spinal surgery due to spinal stenosis in 2009.  April 2013 revisited Dr Senaida Ores ,Orthopedic surgeon @ [facility] due to spasms and pain . MRI showed hernia at surgery site, and bulging disc - prescribed oxycodone 5 mg & muscle relaxant,Flexeril 10mg  TID PRN  .  Visited family in [place] 3 weeks ago and had severe muscle spasms which caused him to be on bedrest for 3 days.  Primary care physician in [place] prescribed Lidocaine patch and Gabapentin 300 mg TID PRN. He complaints of constant numbness and tingling which radiates from left hip to right hip and down left leg since 03/2008 post 2nd back surgery.     Review of Systems  Incapacitating symptoms are occurring every few months and are not constant. He denies incontinence of urine or stool     Objective:   Physical Exam  He appears healthy and well-nourished; he is in no acute distress at this time  Gait is somewhat slow with suggestion of a slight limp. Heel and tiptoe walking can be completed.  He is able to lie flat and sit up on the exam table without deficit, albeit slowly.  There is decrease in  range of motion in the lower extremities at the hips.  Straight leg raising is negative.  Deep tendon reflexes are 1/2+ but equal. Strength and tone are good        Assessment & Plan:  #1 chronic lumbosacral radiculopathy in the context of back surgery x2. Incapacitating symptoms appeared to be acute low back muscular spasm.  Plan: Options were discussed. He can be referred to a neurosurgeon; the most important goal is to optimize back hygiene.See Orders

## 2011-12-10 NOTE — Patient Instructions (Addendum)
Review and correct the record as indicated. Please share record with all medical staff seen. The best exercises for the low back include freestyle swimming, stretch aerobics, and yoga.  If you activate My Chart; the results can be released to you as soon as they populate from the lab. If you choose not to use this program; the labs have to be reviewed, copied & mailed   causing a delay in getting the results to you.

## 2011-12-16 ENCOUNTER — Ambulatory Visit: Payer: Medicare Other | Attending: Internal Medicine | Admitting: Physical Therapy

## 2011-12-16 DIAGNOSIS — IMO0001 Reserved for inherently not codable concepts without codable children: Secondary | ICD-10-CM | POA: Diagnosis not present

## 2011-12-16 DIAGNOSIS — R293 Abnormal posture: Secondary | ICD-10-CM | POA: Diagnosis not present

## 2011-12-16 DIAGNOSIS — M545 Low back pain, unspecified: Secondary | ICD-10-CM | POA: Insufficient documentation

## 2011-12-16 DIAGNOSIS — M2569 Stiffness of other specified joint, not elsewhere classified: Secondary | ICD-10-CM | POA: Diagnosis not present

## 2011-12-23 ENCOUNTER — Ambulatory Visit (INDEPENDENT_AMBULATORY_CARE_PROVIDER_SITE_OTHER): Payer: Medicare Other | Admitting: Pharmacist

## 2011-12-23 DIAGNOSIS — Z86718 Personal history of other venous thrombosis and embolism: Secondary | ICD-10-CM | POA: Diagnosis not present

## 2011-12-23 DIAGNOSIS — I4891 Unspecified atrial fibrillation: Secondary | ICD-10-CM

## 2011-12-23 LAB — POCT INR: INR: 2.4

## 2011-12-24 ENCOUNTER — Other Ambulatory Visit: Payer: Self-pay | Admitting: Cardiology

## 2011-12-28 ENCOUNTER — Telehealth: Payer: Self-pay

## 2011-12-28 NOTE — Telephone Encounter (Signed)
Pt wife states pt not sleeping and in pain need Rx for sleeping med.  Plz advise    MW

## 2011-12-28 NOTE — Telephone Encounter (Signed)
Sleeping pills are a major concern with his history of snoring and asthma. If it is  the back pain which is keeping him awake; referral to a back specialist is indicated if no better with physical therapy

## 2011-12-29 ENCOUNTER — Ambulatory Visit: Payer: Medicare Other | Admitting: Physical Therapy

## 2011-12-29 NOTE — Telephone Encounter (Signed)
I spoke with patient and he stated he is already doing therapy for his back and he will just wait and see if sleep habits get better

## 2011-12-30 ENCOUNTER — Telehealth: Payer: Self-pay

## 2011-12-30 NOTE — Telephone Encounter (Signed)
Recommend CBC and differential and TSH; diagnosis 780.79. men's daily multivitamin with out iron pending these results. Please notify me if there is a nonoperative back specialist you would like to see or contact your surgeon at [facility] to see if there is a doctor in [place] or [address] with whom he works.

## 2011-12-30 NOTE — Telephone Encounter (Signed)
Contacted pt wife advises:  Recommend CBC and differential and TSH; diagnosis 780.79. men's daily multivitamin with out iron pending these results. Please notify me if there is a nonoperative back specialist you would like to see or contact your surgeon at [facility] to see if there is a doctor in [place] or [address] with whom he works.  Pt wife stated understanding. Put caller thru to [facility] to schedule appt    MW

## 2011-12-30 NOTE — Telephone Encounter (Signed)
Pt wife called stating husband has no energy. Pt wife asked is there a Rx you can give pt? Plz advise      MW

## 2011-12-30 NOTE — Telephone Encounter (Signed)
appt made for tomorrow at 3pm due to PT schedule

## 2011-12-31 ENCOUNTER — Other Ambulatory Visit (INDEPENDENT_AMBULATORY_CARE_PROVIDER_SITE_OTHER): Payer: Medicare Other

## 2011-12-31 ENCOUNTER — Ambulatory Visit: Payer: Medicare Other | Admitting: Physical Therapy

## 2011-12-31 DIAGNOSIS — R5383 Other fatigue: Secondary | ICD-10-CM

## 2011-12-31 DIAGNOSIS — R5381 Other malaise: Secondary | ICD-10-CM | POA: Diagnosis not present

## 2011-12-31 LAB — CBC WITH DIFFERENTIAL/PLATELET
Basophils Absolute: 0.1 10*3/uL (ref 0.0–0.1)
Eosinophils Relative: 2 % (ref 0.0–5.0)
HCT: 40.1 % (ref 39.0–52.0)
Lymphs Abs: 2.7 10*3/uL (ref 0.7–4.0)
MCV: 99.3 fl (ref 78.0–100.0)
Monocytes Absolute: 1.3 10*3/uL — ABNORMAL HIGH (ref 0.1–1.0)
Neutrophils Relative %: 63.3 % (ref 43.0–77.0)
Platelets: 437 10*3/uL — ABNORMAL HIGH (ref 150.0–400.0)
RDW: 14.8 % — ABNORMAL HIGH (ref 11.5–14.6)
WBC: 11.6 10*3/uL — ABNORMAL HIGH (ref 4.5–10.5)

## 2012-01-01 DIAGNOSIS — H9110 Presbycusis, unspecified ear: Secondary | ICD-10-CM | POA: Diagnosis not present

## 2012-01-01 DIAGNOSIS — H905 Unspecified sensorineural hearing loss: Secondary | ICD-10-CM | POA: Diagnosis not present

## 2012-01-01 LAB — TSH: TSH: 1.82 u[IU]/mL (ref 0.35–5.50)

## 2012-01-05 ENCOUNTER — Ambulatory Visit: Payer: Medicare Other | Admitting: Rehabilitation

## 2012-01-05 ENCOUNTER — Ambulatory Visit (INDEPENDENT_AMBULATORY_CARE_PROVIDER_SITE_OTHER): Payer: Medicare Other | Admitting: *Deleted

## 2012-01-05 DIAGNOSIS — I4891 Unspecified atrial fibrillation: Secondary | ICD-10-CM | POA: Diagnosis not present

## 2012-01-05 DIAGNOSIS — Z86718 Personal history of other venous thrombosis and embolism: Secondary | ICD-10-CM | POA: Diagnosis not present

## 2012-01-05 LAB — POCT INR: INR: 2.1

## 2012-01-07 ENCOUNTER — Ambulatory Visit: Payer: Medicare Other | Admitting: Rehabilitation

## 2012-01-08 ENCOUNTER — Other Ambulatory Visit: Payer: Self-pay

## 2012-01-08 ENCOUNTER — Other Ambulatory Visit: Payer: Self-pay | Admitting: Cardiology

## 2012-01-11 ENCOUNTER — Other Ambulatory Visit: Payer: Self-pay | Admitting: Cardiology

## 2012-01-11 NOTE — Telephone Encounter (Signed)
90 day supply with 3 refills

## 2012-01-12 ENCOUNTER — Ambulatory Visit: Payer: Medicare Other | Attending: Internal Medicine | Admitting: Rehabilitation

## 2012-01-12 DIAGNOSIS — M2569 Stiffness of other specified joint, not elsewhere classified: Secondary | ICD-10-CM | POA: Insufficient documentation

## 2012-01-12 DIAGNOSIS — M545 Low back pain, unspecified: Secondary | ICD-10-CM | POA: Insufficient documentation

## 2012-01-12 DIAGNOSIS — R293 Abnormal posture: Secondary | ICD-10-CM | POA: Insufficient documentation

## 2012-01-12 DIAGNOSIS — IMO0001 Reserved for inherently not codable concepts without codable children: Secondary | ICD-10-CM | POA: Insufficient documentation

## 2012-01-14 ENCOUNTER — Encounter: Payer: Medicare Other | Admitting: Rehabilitation

## 2012-01-19 ENCOUNTER — Ambulatory Visit: Payer: Medicare Other | Admitting: Physical Therapy

## 2012-01-21 ENCOUNTER — Encounter: Payer: Medicare Other | Admitting: Physical Therapy

## 2012-01-26 ENCOUNTER — Telehealth: Payer: Self-pay | Admitting: Internal Medicine

## 2012-01-26 ENCOUNTER — Ambulatory Visit: Payer: Self-pay | Admitting: Family Medicine

## 2012-01-26 NOTE — Telephone Encounter (Signed)
Patient Information:  Caller Name: Clydie Braun  Phone: [phone]  Patient: [name], [name]  Gender: Male  DOB: [birth year]  Age: 66 Years  PCP: Marga Melnick   Symptoms  Reason For Call & Symptoms: Pt has been very fatigued. Rn checked EPIC. Pt last had an appt on 10/23 to check CBC/labs otherwise labs were normal. wife states that the fatigue has worsened and he is sleeping most of the day since Sunday 01/24/12. No fever or other signs of illness  Reviewed Health History In EMR: Yes  Reviewed Medications In EMR: Yes  Reviewed Allergies In EMR: Yes  Date of Onset of Symptoms: 01/24/2012  Treatments Tried: labs checked on 12/31/11.  Treatments Tried Worked: No  Guideline(s) Used:  Weakness (Generalized) and Fatigue  Disposition Per Guideline:   Go to Office Now  Reason For Disposition Reached:   Moderate weakness (i.e., interferes with work, school, normal activities) and cause unknown  Advice Given:  N/A  Office Follow Up:  Does the office need to follow up with this patient?: No  Instructions For The Office: N/A  Appointment Scheduled:  01/26/2012 14:15:00

## 2012-01-29 ENCOUNTER — Encounter: Payer: Self-pay | Admitting: Internal Medicine

## 2012-02-01 ENCOUNTER — Other Ambulatory Visit: Payer: Self-pay | Admitting: Internal Medicine

## 2012-02-01 ENCOUNTER — Telehealth: Payer: Self-pay | Admitting: Internal Medicine

## 2012-02-01 ENCOUNTER — Ambulatory Visit (INDEPENDENT_AMBULATORY_CARE_PROVIDER_SITE_OTHER): Payer: Medicare Other

## 2012-02-01 DIAGNOSIS — Z86718 Personal history of other venous thrombosis and embolism: Secondary | ICD-10-CM | POA: Diagnosis not present

## 2012-02-01 DIAGNOSIS — I4891 Unspecified atrial fibrillation: Secondary | ICD-10-CM

## 2012-02-01 DIAGNOSIS — M5417 Radiculopathy, lumbosacral region: Secondary | ICD-10-CM

## 2012-02-01 NOTE — Telephone Encounter (Signed)
Patient called stating PT has not worked for back problems Would like to referral to neuro and does not want to go back to [facility] Pt would like to be contacted through his MyChart.

## 2012-02-01 NOTE — Telephone Encounter (Signed)
[place] please advise  

## 2012-02-09 ENCOUNTER — Ambulatory Visit (INDEPENDENT_AMBULATORY_CARE_PROVIDER_SITE_OTHER): Payer: Medicare Other

## 2012-02-09 DIAGNOSIS — Z23 Encounter for immunization: Secondary | ICD-10-CM | POA: Diagnosis not present

## 2012-02-10 ENCOUNTER — Other Ambulatory Visit: Payer: Self-pay | Admitting: Internal Medicine

## 2012-02-10 NOTE — Telephone Encounter (Signed)
OK X1 

## 2012-02-10 NOTE — Telephone Encounter (Signed)
OV 12/10/11. Last filled 10/12/11 # 30 x 2.  Plz advise     MW

## 2012-02-17 DIAGNOSIS — M545 Low back pain: Secondary | ICD-10-CM | POA: Diagnosis not present

## 2012-02-17 DIAGNOSIS — Z981 Arthrodesis status: Secondary | ICD-10-CM | POA: Diagnosis not present

## 2012-02-17 DIAGNOSIS — M48061 Spinal stenosis, lumbar region without neurogenic claudication: Secondary | ICD-10-CM | POA: Diagnosis not present

## 2012-02-19 ENCOUNTER — Ambulatory Visit (INDEPENDENT_AMBULATORY_CARE_PROVIDER_SITE_OTHER): Payer: Medicare Other

## 2012-02-19 DIAGNOSIS — I4891 Unspecified atrial fibrillation: Secondary | ICD-10-CM

## 2012-02-19 DIAGNOSIS — Z86718 Personal history of other venous thrombosis and embolism: Secondary | ICD-10-CM

## 2012-02-22 DIAGNOSIS — K469 Unspecified abdominal hernia without obstruction or gangrene: Secondary | ICD-10-CM | POA: Diagnosis not present

## 2012-02-29 ENCOUNTER — Other Ambulatory Visit: Payer: Self-pay | Admitting: Internal Medicine

## 2012-03-01 NOTE — Telephone Encounter (Signed)
Spoke with patient's wife, patient will stop by on Thursday to sign contract and pick-up rx

## 2012-03-15 DIAGNOSIS — Z79899 Other long term (current) drug therapy: Secondary | ICD-10-CM | POA: Diagnosis not present

## 2012-03-22 ENCOUNTER — Ambulatory Visit (INDEPENDENT_AMBULATORY_CARE_PROVIDER_SITE_OTHER): Payer: Medicare Other

## 2012-03-22 DIAGNOSIS — I4891 Unspecified atrial fibrillation: Secondary | ICD-10-CM | POA: Diagnosis not present

## 2012-03-22 DIAGNOSIS — M545 Low back pain: Secondary | ICD-10-CM | POA: Diagnosis not present

## 2012-03-22 DIAGNOSIS — Z86718 Personal history of other venous thrombosis and embolism: Secondary | ICD-10-CM | POA: Diagnosis not present

## 2012-04-01 ENCOUNTER — Encounter: Payer: Self-pay | Admitting: Internal Medicine

## 2012-04-04 DIAGNOSIS — IMO0002 Reserved for concepts with insufficient information to code with codable children: Secondary | ICD-10-CM | POA: Diagnosis not present

## 2012-04-04 DIAGNOSIS — M48061 Spinal stenosis, lumbar region without neurogenic claudication: Secondary | ICD-10-CM | POA: Diagnosis not present

## 2012-04-05 ENCOUNTER — Other Ambulatory Visit: Payer: Self-pay | Admitting: Internal Medicine

## 2012-04-14 DIAGNOSIS — IMO0002 Reserved for concepts with insufficient information to code with codable children: Secondary | ICD-10-CM | POA: Diagnosis not present

## 2012-04-19 ENCOUNTER — Ambulatory Visit (INDEPENDENT_AMBULATORY_CARE_PROVIDER_SITE_OTHER): Payer: Medicare Other | Admitting: *Deleted

## 2012-04-19 DIAGNOSIS — Z86718 Personal history of other venous thrombosis and embolism: Secondary | ICD-10-CM

## 2012-04-19 DIAGNOSIS — I4891 Unspecified atrial fibrillation: Secondary | ICD-10-CM | POA: Diagnosis not present

## 2012-05-02 DIAGNOSIS — IMO0002 Reserved for concepts with insufficient information to code with codable children: Secondary | ICD-10-CM | POA: Diagnosis not present

## 2012-05-04 DIAGNOSIS — IMO0002 Reserved for concepts with insufficient information to code with codable children: Secondary | ICD-10-CM | POA: Diagnosis not present

## 2012-05-04 DIAGNOSIS — M48061 Spinal stenosis, lumbar region without neurogenic claudication: Secondary | ICD-10-CM | POA: Diagnosis not present

## 2012-05-09 ENCOUNTER — Ambulatory Visit (INDEPENDENT_AMBULATORY_CARE_PROVIDER_SITE_OTHER): Payer: Medicare Other | Admitting: Internal Medicine

## 2012-05-09 VITALS — BP 114/68 | HR 67 | Temp 97.1°F | Wt 281.6 lb

## 2012-05-09 DIAGNOSIS — J029 Acute pharyngitis, unspecified: Secondary | ICD-10-CM

## 2012-05-09 DIAGNOSIS — K089 Disorder of teeth and supporting structures, unspecified: Secondary | ICD-10-CM

## 2012-05-09 DIAGNOSIS — K0889 Other specified disorders of teeth and supporting structures: Secondary | ICD-10-CM

## 2012-05-09 LAB — POCT RAPID STREP A (OFFICE): Rapid Strep A Screen: NEGATIVE

## 2012-05-09 NOTE — Patient Instructions (Addendum)
Zicam Melts or Zinc lozenges for scratchy throat ; vitamin C 2000 mg daily; & Echinacea for 4-7 days. Report persistent or progressive  fever, exudate("pus") or frontal headache or facial  pain.  Review and correct the record as indicated. Please share record with all medical staff seen.

## 2012-05-09 NOTE — Progress Notes (Signed)
  Subjective:    Patient ID: [name] [name], male    DOB: Sep 30, 1945, 67 y.o.   MRN: [id number]  HPI The respiratory tract symptoms began  05/05/12 as pain with swallowing food w/o sore throat.  No exposures reported  to sick family, friends,  allergens, or environmental factors as triggers .  Significant active   symptoms include R upper dental pain & throat pain but 80% improved. Cough is throat clearing & not associated with sputum production . No wheezing  & dyspnea reported.  Flu shot current   Treatment with  Tylenol was ineffective. There is  history of asthma &  seasonal  allergies.  The patient had quit smoking in 2002.                     Review of Systems Symptoms not present include frontal headache, facial pain, nasal purulence,  earache, &  otic discharge. Fever, chills, sweats were not present . Itchy/  watery eyes &  sneezing were not noted. Myalgias and arthralgias were not present.     Objective:   Physical Exam  General appearance:good health ;well nourished; no acute distress or increased work of breathing is present.   Eyes: No conjunctival inflammation or lid edema is present.  Ears:  External ear exam shows no significant lesions or deformities.  Otoscopic examination reveals clear canals, tympanic membranes are intact bilaterally without bulging, retraction, inflammation or discharge. Nose:  External nasal examination shows no deformity or inflammation. Nasal mucosa are pink and moist without lesions or exudates. No septal dislocation or deviation.No obstruction to airflow.  Oral exam: Dental hygiene is good; lips and gums are healthy appearing.There is no oropharyngeal erythema or exudate noted.  Neck:  No deformities, masses, or tenderness noted.   Heart:  Normal rate and regular rhythm. S1 and S2 normal without gallop, click, rub or murmur.   Lungs:Chest clear to auscultation; no wheezes, rhonchi,rales ,or rubs present.No increased work of  breathing.   Extremities:  No cyanosis, edema, or clubbing  noted  No  lymphadenopathy about the head, neck, or axilla noted.  Skin: Warm & dry          Assessment & Plan:  #1 throat pain w/o signs / symptoms of sinusitis or bronchitis. Negative rapid Strep swab Plan: See orders and recommendations

## 2012-05-17 DIAGNOSIS — IMO0002 Reserved for concepts with insufficient information to code with codable children: Secondary | ICD-10-CM | POA: Diagnosis not present

## 2012-05-17 DIAGNOSIS — M48061 Spinal stenosis, lumbar region without neurogenic claudication: Secondary | ICD-10-CM | POA: Diagnosis not present

## 2012-05-26 ENCOUNTER — Telehealth: Payer: Self-pay | Admitting: Cardiology

## 2012-05-26 NOTE — Telephone Encounter (Signed)
Per call from wife - pt has not been feeling well for the past several days.  Last night while at dinner with friends he became very weak and as if he was having a hot flash.  His friend had to drive him home with the A/C on and the window open.  Of note pt has been off his coumadin for several weeks (since 2/21) for a steroid injection into his hip for numbness.  Dr Dewaine Conger did this.  Pt was not cleared from a cardiac standpoint for the procedure or to hold his coumadin.  He did restart 3/12 and will have his INR checked 3/24 AM.  Pt does not have any s/s of CVA - no slurred speech, facial droop, one sided weakness etc.  Pt is scheduled to see Dr Antoine Poche for eval on Monday.  Wife will call back if s/s worsen.  She will attempt to have BP checked if weakness and hot flashes return prior to appt.

## 2012-05-26 NOTE — Telephone Encounter (Signed)
New problem    Per pts wife(karen) pt is having hot spells frequently and then complains of being tired and weak-offered pt to see a PA but she would prefer him to see dr hochrein- but thinks he really needs to be seen

## 2012-05-27 ENCOUNTER — Ambulatory Visit (INDEPENDENT_AMBULATORY_CARE_PROVIDER_SITE_OTHER): Payer: Medicare Other | Admitting: Pharmacist

## 2012-05-27 ENCOUNTER — Ambulatory Visit (INDEPENDENT_AMBULATORY_CARE_PROVIDER_SITE_OTHER): Payer: Medicare Other | Admitting: Cardiology

## 2012-05-27 ENCOUNTER — Encounter: Payer: Self-pay | Admitting: Cardiology

## 2012-05-27 VITALS — BP 120/86 | HR 68 | Ht 74.0 in | Wt 276.0 lb

## 2012-05-27 DIAGNOSIS — I10 Essential (primary) hypertension: Secondary | ICD-10-CM | POA: Diagnosis not present

## 2012-05-27 DIAGNOSIS — I4891 Unspecified atrial fibrillation: Secondary | ICD-10-CM

## 2012-05-27 DIAGNOSIS — I428 Other cardiomyopathies: Secondary | ICD-10-CM | POA: Diagnosis not present

## 2012-05-27 DIAGNOSIS — Z86718 Personal history of other venous thrombosis and embolism: Secondary | ICD-10-CM | POA: Diagnosis not present

## 2012-05-27 LAB — POCT INR: INR: 1.7

## 2012-05-27 NOTE — Patient Instructions (Addendum)
The current medical regimen is effective;  continue present plan and medications.  Your physician has requested that you have a lexiscan myoview. For further information please visit [email or web address] Please follow instruction sheet, as given.  Follow up as previously scheduled with Dr Antoine Poche

## 2012-05-27 NOTE — Progress Notes (Signed)
HPI The patient presented for an evaluation of dyspnea.   He was added to my schedule after his wife called to say that he was having episodes of dyspnea and fatigue. He actually been off of his warfarin for some spinal injections and she was concerned about this.  He has worn a monitor in the recent past to evaluate whether any episodes of weakness or tiredness are related to his paroxysmal atrial fibrillation. Episodes of tiredness haven't been correlated with episodes of atrial fib.  He is now describing episodes of when he gets up from a chair and walks to the bathroom he would feel lightheaded and short of breath. He'll have to compose himself for several minutes before it resolves and that his fine. He's not really describing orthostatic symptoms. He's not describing associated symptoms such as arm or chest discomfort with this. However, he does have some "streaking" discomfort down into his left arm lasting for minutes at a time and occurring sporadically.  Allergies  Allergen Reactions  . Penicillins Anaphylaxis         Current Outpatient Prescriptions  Medication Sig Dispense Refill  . cyclobenzaprine (FLEXERIL) 10 MG tablet Take 10 mg by mouth as needed.      . diltiazem (DILACOR XR) 240 MG 24 hr capsule TAKE 1 CAPSULE DAILY  90 capsule  3  . flecainide (TAMBOCOR) 150 MG tablet Take 1 tablet (150 mg total) by mouth 2 (two) times daily.  180 tablet  3  . gabapentin (NEURONTIN) 300 MG capsule Take 300 mg by mouth 3 (three) times daily as needed.       . lidocaine (LIDODERM) 5 % Place 1 patch onto the skin as needed. Remove & Discard patch within 12 hours or as directed by MD      . LORazepam (ATIVAN) 0.5 MG tablet TAKE 1 TABLET BY MOUTH ONCE DAILY AT BEDTIME AS NEEDED  30 tablet  0  . metoprolol succinate (TOPROL-XL) 25 MG 24 hr tablet       . omeprazole (PRILOSEC) 20 MG capsule TAKE 1 CAPSULE DAILY  90 capsule  3  . oxycodone (OXY-IR) 5 MG capsule Take 5 mg by mouth every 4 (four)  hours as needed.      . sertraline (ZOLOFT) 50 MG tablet TAKE 1 TABLET DAILY  90 tablet  1  . warfarin (COUMADIN) 3 MG tablet Take as directed by anticoagulation clinic  160 tablet  1   No current facility-administered medications for this visit.    Past Medical History  Diagnosis Date  . BPH (benign prostatic hypertrophy)   . Spinal stenosis     congential  . Hypertension   . Asthma   . History of blood clots     in legs and lungs following back surgery in 2010  . Allergic rhinitis   . Atrial fibrillation   . 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 Cataract     REMOVED  . Clotting disorder     BLOOD CLOTS IN LEGS/LUNGS -2010  . COPD (chronic obstructive pulmonary disease)   . GERD (gastroesophageal reflux disease)     Past Surgical History  Procedure Laterality Date  . Vasectomy    . Lumbar fusion      x2  . Total hip arthroplasty      bilat  . Cataracts      ROS:  As stated in the HPI and negative for all other systems.  PHYSICAL EXAM BP 130/72  Pulse 65  Ht 6\' 2"  (  1.88 m)  Wt 276 lb (125.193 kg)  BMI 35.42 kg/m2  SpO2 94% GENERAL:  Well appearing HEENT:  Pupils equal round and reactive, fundi not visualized, oral mucosa unremarkable NECK:  No jugular venous distention, waveform within normal limits, carotid upstroke brisk and symmetric, no bruits, no thyromegaly LYMPHATICS:  No cervical, inguinal adenopathy LUNGS:  Clear to auscultation bilaterally BACK:  No CVA tenderness CHEST:  Unremarkable HEART:  PMI not displaced or sustained,S1 and S2 within normal limits, no S3, no S4, no clicks, no rubs, no murmurs ABD:  Flat, positive bowel sounds normal in frequency in pitch, no bruits, no rebound, no guarding, no midline pulsatile mass, no hepatomegaly, no splenomegaly, obese EXT:  2 plus pulses throughout, no edema, no cyanosis no clubbing SKIN:  No rashes no nodules NEURO:  Cranial nerves II through XII grossly intact, motor grossly intact throughout PSYCH:   Cognitively intact, oriented to person place and time  EKG:  Sinus rhythm, rate 60, axis within normal limits, intervals within normal limits, no acute ST-T wave changes.  05/27/2012  ASSESSMENT AND PLAN  ATRIAL FIBRILLATION:  I don't think this is related to the current symptoms.  For now he will continue on the meds as listed.  DYSPNEA/DIZZINESS:  I suspect this is related to orthostasis. We talked about conservative therapies for this. He does not want to wear compression stockings. He will let me know if this gets worse with time.  ARM PAIN:  I would like to screen him with an exercise treadmill test. However, he wouldn't be a little walk on a treadmill with his back problems. Therefore, he will have a [employer].

## 2012-05-30 ENCOUNTER — Ambulatory Visit: Payer: Medicare Other | Admitting: Cardiology

## 2012-06-07 ENCOUNTER — Ambulatory Visit (HOSPITAL_COMMUNITY): Payer: Medicare Other | Attending: Cardiology | Admitting: Radiology

## 2012-06-07 VITALS — BP 121/73 | HR 53 | Ht 74.0 in | Wt 271.0 lb

## 2012-06-07 DIAGNOSIS — R0789 Other chest pain: Secondary | ICD-10-CM | POA: Insufficient documentation

## 2012-06-07 DIAGNOSIS — R0609 Other forms of dyspnea: Secondary | ICD-10-CM | POA: Insufficient documentation

## 2012-06-07 DIAGNOSIS — R0602 Shortness of breath: Secondary | ICD-10-CM | POA: Diagnosis not present

## 2012-06-07 DIAGNOSIS — Z8249 Family history of ischemic heart disease and other diseases of the circulatory system: Secondary | ICD-10-CM | POA: Diagnosis not present

## 2012-06-07 DIAGNOSIS — R0989 Other specified symptoms and signs involving the circulatory and respiratory systems: Secondary | ICD-10-CM | POA: Insufficient documentation

## 2012-06-07 DIAGNOSIS — R42 Dizziness and giddiness: Secondary | ICD-10-CM | POA: Insufficient documentation

## 2012-06-07 DIAGNOSIS — I428 Other cardiomyopathies: Secondary | ICD-10-CM

## 2012-06-07 DIAGNOSIS — R079 Chest pain, unspecified: Secondary | ICD-10-CM | POA: Diagnosis not present

## 2012-06-07 DIAGNOSIS — Z87891 Personal history of nicotine dependence: Secondary | ICD-10-CM | POA: Insufficient documentation

## 2012-06-07 DIAGNOSIS — I10 Essential (primary) hypertension: Secondary | ICD-10-CM

## 2012-06-07 DIAGNOSIS — R5381 Other malaise: Secondary | ICD-10-CM | POA: Insufficient documentation

## 2012-06-07 DIAGNOSIS — R5383 Other fatigue: Secondary | ICD-10-CM | POA: Insufficient documentation

## 2012-06-07 DIAGNOSIS — I4891 Unspecified atrial fibrillation: Secondary | ICD-10-CM

## 2012-06-07 MED ORDER — TECHNETIUM TC 99M SESTAMIBI GENERIC - CARDIOLITE
30.0000 | Freq: Once | INTRAVENOUS | Status: AC | PRN
  Administered 2012-06-07: 30 via INTRAVENOUS

## 2012-06-07 MED ORDER — REGADENOSON 0.4 MG/5ML IV SOLN
0.4000 mg | Freq: Once | INTRAVENOUS | Status: AC
  Administered 2012-06-07: 0.4 mg via INTRAVENOUS

## 2012-06-07 MED ORDER — TECHNETIUM TC 99M SESTAMIBI GENERIC - CARDIOLITE
10.0000 | Freq: Once | INTRAVENOUS | Status: AC | PRN
  Administered 2012-06-07: 10 via INTRAVENOUS

## 2012-06-07 NOTE — Progress Notes (Signed)
[facility] SITE 3 NUCLEAR MED [address] [place], [place] [address] [phone]    Cardiology Nuclear Med Study  [name] [name] is a 67 y.o. male     MRN : [id number]     DOB: October 26, 1945  Procedure Date: 06/07/2012  Nuclear Med Background Indication for Stress Test:  Evaluation for Ischemia History:  '82 Cath:normal coronaries; '06 OZH:YQMVHQ, EF=57%; '09 Echo:EF=60%; h/o atrial fibrillation Cardiac Risk Factors: Family History - CAD, History of Smoking, Hypertension and Obesity  Symptoms:  Chest Tightness>(L) Arm with and without Exertion (last date of chest discomfort was yesterday), DOE, Fatigue, Light-Headedness and Rapid HR   Nuclear Pre-Procedure Caffeine/Decaff Intake:  None > 12 hrs NPO After: 8:00pm   Lungs:  Clear. O2 Sat: 97% on room air. IV 0.9% NS with Angio Cath:  22g  IV Site: R Antecubital x 1, tolerated well IV Started by:  Irean Hong, RN  Chest Size (in):  50 Cup Size: n/a  Height: 6\' 2"  (1.88 m)  Weight:  271 lb (122.925 kg)  BMI:  Body mass index is 34.78 kg/(m^2). Tech Comments:  Held Toprol x 36 hrs; no medication today    Nuclear Med Study 1 or 2 day study: 1 day  Stress Test Type:  Lexiscan  Reading MD: Willa Rough, MD  Order Authorizing Provider:  Rollene Rotunda, MD  Resting Radionuclide: Technetium 73m Sestamibi  Resting Radionuclide Dose: 11.0 mCi   Stress Radionuclide:  Technetium 58m Sestamibi  Stress Radionuclide Dose: 33.0 mCi           Stress Protocol Rest HR: 53 Stress HR: 72  Rest BP: 121/73 Stress BP: 123/75  Exercise Time (min): n/a METS: n/a   Predicted Max HR: 152 bpm % Max HR: 47.37 bpm Rate Pressure Product: 8856   Dose of Adenosine (mg):  n/a Dose of Lexiscan: 0.4 mg  Dose of Atropine (mg): n/a Dose of Dobutamine: n/a mcg/kg/min (at max HR)  Stress Test Technologist: Smiley Houseman, CMA-N  Nuclear Technologist:  Domenic Polite, CNMT     Rest Procedure:  Myocardial perfusion imaging was performed at rest  45 minutes following the intravenous administration of Technetium 74m Sestamibi.  Rest ECG: Sinus bradycardia  Stress Procedure:  The patient received IV Lexiscan 0.4 mg over 15-seconds.  Technetium 86m Sestamibi injected at 30-seconds.  Quantitative spect images were obtained after a 45 minute delay.  Stress ECG: No significant change from baseline ECG  QPS Raw Data Images:  Patient motion noted; appropriate software correction applied. Stress Images:  Normal homogeneous uptake in all areas of the myocardium. Rest Images:  Normal homogeneous uptake in all areas of the myocardium. Subtraction (SDS):  No evidence of ischemia. Transient Ischemic Dilatation (Normal <1.22):  1.07 Lung/Heart Ratio (Normal <0.45):  0.31  Quantitative Gated Spect Images QGS EDV:  113 ml QGS ESV:  41 ml  Impression Exercise Capacity:  Lexiscan with no exercise. BP Response:  Normal blood pressure response. Clinical Symptoms:  the patient felt tired ECG Impression:  No significant ST segment change suggestive of ischemia. Comparison with Prior Nuclear Study: No images to compare  Overall Impression:  Normal stress nuclear study.  This is a low-risk scan  LV Ejection Fraction: 64%.  LV Wall Motion:  Normal Wall Motion  Willa Rough, MD

## 2012-06-09 ENCOUNTER — Telehealth: Payer: Self-pay | Admitting: Cardiology

## 2012-06-09 NOTE — Telephone Encounter (Signed)
New problem   Pt had Adenosine stress test and has been dizziness and tired since this test. Pt's spouse would like to talk to nurse concerning this matter

## 2012-06-10 NOTE — Telephone Encounter (Signed)
Reviewed results of lexiscan with wife - pt continues to have episodes where he feels poorly and is having dizziness.   Advised wife to have pt attempt to wear compression stockings (knee length 20 to 30 mmhg) to see if improvement. Gave her information about company in [place] that sales them.  She states understanding and will obtain and have him to wear them.  If they don't see improvement she will call back.

## 2012-06-20 ENCOUNTER — Ambulatory Visit (INDEPENDENT_AMBULATORY_CARE_PROVIDER_SITE_OTHER): Payer: Medicare Other | Admitting: Pharmacist

## 2012-06-20 DIAGNOSIS — I4891 Unspecified atrial fibrillation: Secondary | ICD-10-CM | POA: Diagnosis not present

## 2012-06-20 DIAGNOSIS — Z86718 Personal history of other venous thrombosis and embolism: Secondary | ICD-10-CM | POA: Diagnosis not present

## 2012-06-20 LAB — POCT INR: INR: 2

## 2012-07-13 DIAGNOSIS — J029 Acute pharyngitis, unspecified: Secondary | ICD-10-CM | POA: Diagnosis not present

## 2012-07-13 DIAGNOSIS — J069 Acute upper respiratory infection, unspecified: Secondary | ICD-10-CM | POA: Diagnosis not present

## 2012-07-21 ENCOUNTER — Ambulatory Visit (INDEPENDENT_AMBULATORY_CARE_PROVIDER_SITE_OTHER): Payer: Medicare Other | Admitting: Pharmacist

## 2012-07-21 ENCOUNTER — Ambulatory Visit (INDEPENDENT_AMBULATORY_CARE_PROVIDER_SITE_OTHER): Payer: Medicare Other | Admitting: Internal Medicine

## 2012-07-21 VITALS — BP 110/64 | HR 61 | Temp 98.2°F | Wt 269.0 lb

## 2012-07-21 DIAGNOSIS — H1010 Acute atopic conjunctivitis, unspecified eye: Secondary | ICD-10-CM

## 2012-07-21 DIAGNOSIS — H1013 Acute atopic conjunctivitis, bilateral: Secondary | ICD-10-CM

## 2012-07-21 DIAGNOSIS — Z86718 Personal history of other venous thrombosis and embolism: Secondary | ICD-10-CM | POA: Diagnosis not present

## 2012-07-21 DIAGNOSIS — R05 Cough: Secondary | ICD-10-CM

## 2012-07-21 DIAGNOSIS — I4891 Unspecified atrial fibrillation: Secondary | ICD-10-CM | POA: Diagnosis not present

## 2012-07-21 DIAGNOSIS — R059 Cough, unspecified: Secondary | ICD-10-CM

## 2012-07-21 LAB — POCT INR: INR: 2

## 2012-07-21 MED ORDER — BECLOMETHASONE DIPROPIONATE 80 MCG/ACT IN AERS: 1.0000 | INHALATION_SPRAY | Freq: Two times a day (BID) | RESPIRATORY_TRACT | Status: DC

## 2012-07-21 MED ORDER — FLUTICASONE PROPIONATE 50 MCG/ACT NA SUSP: 1.0000 | Freq: Two times a day (BID) | NASAL | Status: DC | PRN

## 2012-07-21 NOTE — Patient Instructions (Addendum)
Plain Mucinex (NOT D) for thick secretions ;force NON dairy fluids .   Nasal cleansing in the shower as discussed with lather of mild shampoo.After 10 seconds wash off lather while  exhaling through nostrils. Make sure that all residual soap is removed to prevent irritation.  Fluticasone 1 spray in each nostril twice a day as needed. Use the "crossover" technique into opposite nostril spraying toward opposite ear @ 45 degree angle, not straight up into nostril.  Use a Neti pot daily only  as needed for significant sinus congestion; going from open side to congested side . Plain Allegra (NOT D )  160 daily , Loratidine 10 mg , OR Zyrtec 10 mg @ bedtime  as needed for itchy eyes & sneezing. QVAR 80 two inhalations every 12 hours; gargle and spit after use

## 2012-07-21 NOTE — Progress Notes (Signed)
  Subjective:    Patient ID: [name] [name], male    DOB: [birth year], 67 y.o.   MRN: [id number]  HPI   Symptoms began 2 weeks ago as a nonproductive cough in the context of postnasal drainage, rhinitis,sneezing and watery/itchy eyes. He also has sore throat .  He was seen last week in [place]; rapid strep was negative  He's had associated fatigue. His dyspnea is chronic.   Treatment with non prescription cough drops & Albuterol have been only  partially effective.  Not on ACE inhibitor . Past medical history of pulmonary disease negative includes  COPD with RAD          Review of Systems   He denies associated fever, chills, or myalgias.  He has not had frontal headache, facial pain, nasal purulence, otic pain, otic discharge.    Objective:   Physical Exam  General appearance:good health ;well nourished; no acute distress or increased work of breathing is present.  No  lymphadenopathy about the head, neck, or axilla noted.  Eyes: No conjunctival inflammation or lid edema is present.  Ears:  External ear exam shows no significant lesions or deformities.  Otoscopic examination reveals clear canals, tympanic membranes are intact bilaterally without bulging, retraction, inflammation or discharge. Nose:  External nasal examination shows no deformity or inflammation. Nasal mucosa are pink and moist without lesions or exudates. No septal dislocation or deviation.No obstruction to airflow.  Oral exam: Dental hygiene is good; lips and gums are healthy appearing.There is no oropharyngeal erythema or exudate noted.  Neck:  No deformities,  masses, or tenderness noted.    Heart:  Normal rate and regular rhythm. S1 and S2 normal without gallop, murmur, click, rub or other extra sounds. Heart sounds are distant heard best in the epigastrium Lungs:Chest clear to auscultation; no wheezes, rhonchi,rales ,or rubs present.No increased work of breathing.  Overall breath sounds are  decreased Extremities:  No cyanosis, edema, or clubbing  noted  Skin: Warm & dry w/o jaundice .        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 Clinically there is no evidence of rhinosinusitis or acute bronchitis.  Plan: Focus will be on nasal hygiene and anti-inflammatory interventions. Generic Singulair may be considered.

## 2012-07-23 ENCOUNTER — Other Ambulatory Visit: Payer: Self-pay | Admitting: Internal Medicine

## 2012-07-26 NOTE — Telephone Encounter (Signed)
Patient was just here and stated that he was not taking medication. I called patient to confirm and he stated he was no longer taking medication because he was in need of a refill, I explained to patient that when he said he no longer was taking rx the MD removes it from the medications list and this should be worded that he is out of medication vs not taking, patient verbalized understanding.   Hopp please advise if controlled substance can be added back to medication list

## 2012-07-26 NOTE — Telephone Encounter (Signed)
OK X1 

## 2012-08-12 ENCOUNTER — Other Ambulatory Visit: Payer: Self-pay | Admitting: Cardiology

## 2012-08-15 ENCOUNTER — Telehealth: Payer: Self-pay | Admitting: Cardiology

## 2012-08-15 NOTE — Telephone Encounter (Signed)
Refill Request      WARFARIN to mail order

## 2012-08-16 NOTE — Telephone Encounter (Signed)
Telephoned pt and informed him that Rx was done on 08/12/12 at 4:24pm and was received by pharmacy- [facility].

## 2012-08-16 NOTE — Telephone Encounter (Signed)
Follow-up:    Patient called in wanting know why he hasn't received a call back regarding his warfarin (COUMADIN) 3 MG tablet refill.  Please call back.

## 2012-08-22 ENCOUNTER — Ambulatory Visit (INDEPENDENT_AMBULATORY_CARE_PROVIDER_SITE_OTHER): Payer: Medicare Other

## 2012-08-22 DIAGNOSIS — I4891 Unspecified atrial fibrillation: Secondary | ICD-10-CM

## 2012-08-22 DIAGNOSIS — Z86718 Personal history of other venous thrombosis and embolism: Secondary | ICD-10-CM

## 2012-08-24 ENCOUNTER — Other Ambulatory Visit: Payer: Self-pay | Admitting: Internal Medicine

## 2012-09-05 ENCOUNTER — Telehealth: Payer: Self-pay | Admitting: Cardiology

## 2012-09-05 NOTE — Telephone Encounter (Signed)
Pt having spinal injections again and needs to know if he should hold Coumadin.  Aware I will ask Dr Antoine Poche and call him back.

## 2012-09-05 NOTE — Telephone Encounter (Signed)
New Prob    Pt is having a procedure done on 7/16 and wants to know if he should stop taking his COUMADIN prior to procedure. Please call.

## 2012-09-06 NOTE — Telephone Encounter (Signed)
OK to hold warfarin as needed without bridging.

## 2012-09-07 NOTE — Telephone Encounter (Signed)
Left message for pt - ok to hold as needed.  Requested he call back if questions

## 2012-09-15 ENCOUNTER — Other Ambulatory Visit: Payer: Self-pay | Admitting: Internal Medicine

## 2012-09-27 ENCOUNTER — Ambulatory Visit: Payer: Medicare Other | Admitting: Internal Medicine

## 2012-09-29 DIAGNOSIS — M47817 Spondylosis without myelopathy or radiculopathy, lumbosacral region: Secondary | ICD-10-CM | POA: Diagnosis not present

## 2012-10-03 ENCOUNTER — Ambulatory Visit (INDEPENDENT_AMBULATORY_CARE_PROVIDER_SITE_OTHER): Payer: Medicare Other | Admitting: *Deleted

## 2012-10-03 ENCOUNTER — Other Ambulatory Visit: Payer: Self-pay | Admitting: Internal Medicine

## 2012-10-03 DIAGNOSIS — I4891 Unspecified atrial fibrillation: Secondary | ICD-10-CM | POA: Diagnosis not present

## 2012-10-03 DIAGNOSIS — Z86718 Personal history of other venous thrombosis and embolism: Secondary | ICD-10-CM

## 2012-10-03 DIAGNOSIS — G4700 Insomnia, unspecified: Secondary | ICD-10-CM

## 2012-10-03 DIAGNOSIS — K219 Gastro-esophageal reflux disease without esophagitis: Secondary | ICD-10-CM

## 2012-10-03 MED ORDER — LORAZEPAM 0.5 MG PO TABS: ORAL_TABLET | ORAL | Status: DC

## 2012-10-03 NOTE — Telephone Encounter (Signed)
Refill for ativan and prilosec done.

## 2012-10-12 ENCOUNTER — Other Ambulatory Visit: Payer: Self-pay | Admitting: *Deleted

## 2012-10-12 ENCOUNTER — Other Ambulatory Visit: Payer: Self-pay

## 2012-10-12 MED ORDER — DILTIAZEM HCL ER 240 MG PO CP24: ORAL_CAPSULE | ORAL | Status: DC

## 2012-10-12 MED ORDER — FLECAINIDE ACETATE 150 MG PO TABS: 150.0000 mg | ORAL_TABLET | Freq: Two times a day (BID) | ORAL | Status: DC

## 2012-11-08 DIAGNOSIS — M76899 Other specified enthesopathies of unspecified lower limb, excluding foot: Secondary | ICD-10-CM | POA: Diagnosis not present

## 2012-11-09 DIAGNOSIS — M545 Low back pain: Secondary | ICD-10-CM | POA: Diagnosis not present

## 2012-11-09 DIAGNOSIS — IMO0002 Reserved for concepts with insufficient information to code with codable children: Secondary | ICD-10-CM | POA: Diagnosis not present

## 2012-11-14 DIAGNOSIS — IMO0002 Reserved for concepts with insufficient information to code with codable children: Secondary | ICD-10-CM | POA: Diagnosis not present

## 2012-11-14 DIAGNOSIS — M545 Low back pain: Secondary | ICD-10-CM | POA: Diagnosis not present

## 2012-11-16 ENCOUNTER — Ambulatory Visit (INDEPENDENT_AMBULATORY_CARE_PROVIDER_SITE_OTHER): Payer: Medicare Other | Admitting: *Deleted

## 2012-11-16 DIAGNOSIS — Z86718 Personal history of other venous thrombosis and embolism: Secondary | ICD-10-CM | POA: Diagnosis not present

## 2012-11-16 DIAGNOSIS — I4891 Unspecified atrial fibrillation: Secondary | ICD-10-CM | POA: Diagnosis not present

## 2012-11-16 LAB — POCT INR: INR: 3.9

## 2012-11-18 DIAGNOSIS — M545 Low back pain: Secondary | ICD-10-CM | POA: Diagnosis not present

## 2012-11-18 DIAGNOSIS — IMO0002 Reserved for concepts with insufficient information to code with codable children: Secondary | ICD-10-CM | POA: Diagnosis not present

## 2012-11-23 DIAGNOSIS — D3130 Benign neoplasm of unspecified choroid: Secondary | ICD-10-CM | POA: Diagnosis not present

## 2012-11-23 DIAGNOSIS — IMO0002 Reserved for concepts with insufficient information to code with codable children: Secondary | ICD-10-CM | POA: Diagnosis not present

## 2012-11-23 DIAGNOSIS — M545 Low back pain: Secondary | ICD-10-CM | POA: Diagnosis not present

## 2012-11-23 DIAGNOSIS — Z961 Presence of intraocular lens: Secondary | ICD-10-CM | POA: Diagnosis not present

## 2012-11-29 ENCOUNTER — Ambulatory Visit (INDEPENDENT_AMBULATORY_CARE_PROVIDER_SITE_OTHER): Payer: Medicare Other | Admitting: Cardiology

## 2012-11-29 ENCOUNTER — Encounter: Payer: Self-pay | Admitting: Cardiology

## 2012-11-29 VITALS — BP 132/76 | HR 58 | Ht 74.0 in | Wt 276.8 lb

## 2012-11-29 DIAGNOSIS — I428 Other cardiomyopathies: Secondary | ICD-10-CM

## 2012-11-29 DIAGNOSIS — I4891 Unspecified atrial fibrillation: Secondary | ICD-10-CM | POA: Diagnosis not present

## 2012-11-29 DIAGNOSIS — I10 Essential (primary) hypertension: Secondary | ICD-10-CM | POA: Diagnosis not present

## 2012-11-29 NOTE — Patient Instructions (Addendum)
The current medical regimen is effective;  continue present plan and medications.  Follow up in 1 year with Dr Hochrein.  You will receive a letter in the mail 2 months before you are due.  Please call us when you receive this letter to schedule your follow up appointment.  

## 2012-11-29 NOTE — Progress Notes (Signed)
HPI The patient presents for followup of his paroxysmal atrial fibrillation. He has had problems with tiredness and dyspnea and I saw him for this in March. He did have a stress perfusion study which demonstrated no evidence of ischemia or infarct and had a well preserved ejection fraction.  Since that time the patient continues to have the left arm discomfort that he was having. It is a band around his arm that happens sporadically. It does not happen with activities. He is limited in his activities because of orthopedic problems. He denies any chest pressure, neck or arm discomfort. He is really not noticing any palpitations, presyncope or syncope. He's not having as much of the dizziness as he was having before. He's not having any new shortness of breath, PND or orthopnea.  Allergies  Allergen Reactions  . Penicillins Anaphylaxis         Current Outpatient Prescriptions  Medication Sig Dispense Refill  . diltiazem (DILACOR XR) 240 MG 24 hr capsule TAKE 1 CAPSULE DAILY  30 capsule  0  . flecainide (TAMBOCOR) 150 MG tablet Take 1 tablet (150 mg total) by mouth 2 (two) times daily.  60 tablet  0  . LORazepam (ATIVAN) 0.5 MG tablet TAKE 1 TABLET BY MOUTH DAILY AT BEDTIME AS NEEDED  30 tablet  1  . metoprolol succinate (TOPROL-XL) 25 MG 24 hr tablet Take 25 mg by mouth daily.       [name] omeprazole (PRILOSEC) 20 MG capsule TAKE 1 CAPSULE DAILY  90 capsule  3  . sertraline (ZOLOFT) 50 MG tablet TAKE 1 TABLET DAILY  90 tablet  1  . warfarin (COUMADIN) 3 MG tablet TAKE AS DIRECTED BY        ANTICOAGULATION CLINIC  160 tablet  1   No current facility-administered medications for this visit.    Past Medical History  Diagnosis Date  . BPH (benign prostatic hypertrophy)   . Spinal stenosis     congential  . Hypertension   . Asthma   . History of blood clots     in legs and lungs following back surgery in 2010  . Allergic rhinitis   . Atrial fibrillation   . 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  Cataract     REMOVED  . Clotting disorder     BLOOD CLOTS IN LEGS/LUNGS -2010  . COPD (chronic obstructive pulmonary disease)   . GERD (gastroesophageal reflux disease)     Past Surgical History  Procedure Laterality Date  . Vasectomy    . Lumbar fusion      x2  . Total hip arthroplasty      bilat  . Cataracts      ROS:  As stated in the HPI and negative for all other systems.  PHYSICAL EXAM BP 132/76  Pulse 58  Ht 6\' 2"  (1.88 m)  Wt 276 lb 12.8 oz (125.556 kg)  BMI 35.52 kg/m2 GENERAL:  Well appearing HEENT:  Pupils equal round and reactive, fundi not visualized, oral mucosa unremarkable NECK:  No jugular venous distention, waveform within normal limits, carotid upstroke brisk and symmetric, no bruits, no thyromegaly LYMPHATICS:  No cervical, inguinal adenopathy LUNGS:  Clear to auscultation bilaterally BACK:  No CVA tenderness CHEST:  Unremarkable HEART:  PMI not displaced or sustained,S1 and S2 within normal limits, no S3, no S4, no clicks, no rubs, no murmurs ABD:  Flat, positive bowel sounds normal in frequency in pitch, no bruits, no rebound, no guarding, no midline pulsatile mass, no  hepatomegaly, no splenomegaly, obese EXT:  2 plus pulses throughout, no edema, no cyanosis no clubbing SKIN:  No rashes no nodules NEURO:  Cranial nerves II through XII grossly intact, motor grossly intact throughout PSYCH:  Cognitively intact, oriented to person place and time  EKG:  Sinus rhythm, rate 58, axis within normal limits, intervals within normal limits, no acute ST-T wave changes.  11/29/2012  ASSESSMENT AND PLAN  ATRIAL FIBRILLATION:  I don't think this is related to the current symptoms.  He is not interested in switching to a NOAC as I don't feel strongly about the need to change.  DYSPNEA/DIZZINESS:  This is no longer as problematic. No change in therapy is indicated. No further evaluation is planned.  ARM PAIN:  He is given the negative stress perfusion study I do  not suspect that this is obstructive coronary disease. We discussed this and if it bothers him and persist he might see a neurologist.  HTN:  The blood pressure is at target. No change in medications is indicated. We will continue with therapeutic lifestyle changes (TLC).

## 2012-11-30 DIAGNOSIS — IMO0002 Reserved for concepts with insufficient information to code with codable children: Secondary | ICD-10-CM | POA: Diagnosis not present

## 2012-11-30 DIAGNOSIS — M545 Low back pain: Secondary | ICD-10-CM | POA: Diagnosis not present

## 2012-12-07 ENCOUNTER — Ambulatory Visit (INDEPENDENT_AMBULATORY_CARE_PROVIDER_SITE_OTHER): Payer: Medicare Other | Admitting: Internal Medicine

## 2012-12-07 ENCOUNTER — Encounter: Payer: Self-pay | Admitting: Internal Medicine

## 2012-12-07 VITALS — BP 118/76 | HR 70 | Temp 98.6°F | Wt 271.0 lb

## 2012-12-07 DIAGNOSIS — K047 Periapical abscess without sinus: Secondary | ICD-10-CM

## 2012-12-07 DIAGNOSIS — R0600 Dyspnea, unspecified: Secondary | ICD-10-CM

## 2012-12-07 DIAGNOSIS — R0609 Other forms of dyspnea: Secondary | ICD-10-CM | POA: Diagnosis not present

## 2012-12-07 DIAGNOSIS — R012 Other cardiac sounds: Secondary | ICD-10-CM | POA: Diagnosis not present

## 2012-12-07 DIAGNOSIS — R5381 Other malaise: Secondary | ICD-10-CM | POA: Diagnosis not present

## 2012-12-07 NOTE — Patient Instructions (Addendum)
Order for x-rays & labs  entered into  the computer; these will be performed at [address] [name] across from [facility] in am. No appointment is necessary. Please take the probiotic , Align, every day if the bowels are loose. This will replace the normal bacteria which  are necessary for formation of normal stool and processing of food.

## 2012-12-07 NOTE — Progress Notes (Signed)
Subjective:    Patient ID: [name] [name], male    DOB: 1946/01/03, 67 y.o.   MRN: [id number]  HPI He's had persistent fatigue for the last 7-10 days which he attributes to a dental abscess. He had a root canal 12/06/12 by Dr. Orvan Falconer. He was placed on clindamycin 9/27by Dr Odeh,DDS .  He's also had some itchy watery eyes as well as sneezing. He describes cough & shortness of breath. This is not associated with sputum production  He has had some fever, chills, sweats but he describes these as relatively minor.  With this symptom complex he has had some imbalance.      Review of Systems   He has not had associated blurred vision, double vision, or loss of vision. He also denies hoarseness or difficulty swallowing.  He has no cardiac symptoms such as chest pain, rhythm change, edema, or paroxysmal nocturnal dyspnea  Despite being on clindamycin he has no diarrhea. He also has not had any melena or rectal bleeding.  The symptom complex has not been associated with arthralgias or swelling or redness of joints  He also denies muscle weakness or pain  There is no new change in his hair skin or nails  He has no abnormal bruising or bleeding or enlarged lymph nodes.  The symptoms in the context of anxiety or depression.     Objective:   Physical Exam Gen.:  well-nourished in appearance. Alert, appropriate and cooperative throughout exam. Head: Normocephalic without obvious abnormalities  Eyes: No corneal or conjunctival inflammation noted. No icterus Ears: External  ear exam reveals no significant lesions or deformities. Canals clear .TMs normal. Hearing is grossly normal bilaterally. Nose: External nasal exam reveals no deformity or inflammation. Nasal mucosa are pink and moist. No lesions or exudates noted.   Mouth: Oral mucosa and oropharynx reveal no lesions or exudates. Teeth in good repair. Neck: No deformities, masses, or tenderness noted.  Thyroid normal. Lungs: Normal  respiratory effort; chest expands symmetrically. Lungs are clear to auscultation without rales, wheezes, or increased work of breathing. Heart: Normal rate and rhythm. Normal S1 and S2. No gallop, click, or rub. S4 w/o murmur. Heart sounds very distant  Abdomen: Bowel sounds normal; abdomen soft and nontender. No masses, organomegaly or hernias noted.                                 Musculoskeletal/extremities:  No clubbing, cyanosis, edema, or significant extremity  deformity noted. Range of motion normal .Tone & strength  Normal. Joints normal . Nail health good. Able to lie down & sit up w/o help.  Vascular: Carotid, radial artery, dorsalis pedis and  posterior tibial pulses are full and equal. No bruits present. Neurologic: Alert and oriented x3. Deep tendon reflexes symmetrical and normal.          Skin: Intact without suspicious lesions or rashes. Lymph: No cervical, axillary lymphadenopathy present. Psych: Mood and affect are normal. Normally interactive  Assessment & Plan:  #64fatigue #2dyspnea  #3 cough  #4 abnormal heart sounds; R/O pericardial effusion #5 dental abscess See Orders discussion: Warfarin precludes using an anti-inflammatory agents. A chest x-ray will be performed to rule out acute  cardiopulmonary process.  EKG is normal with no evidence of pericarditis or low voltage to suggest pericardial effusion.

## 2012-12-08 ENCOUNTER — Ambulatory Visit (INDEPENDENT_AMBULATORY_CARE_PROVIDER_SITE_OTHER): Payer: Medicare Other | Admitting: *Deleted

## 2012-12-08 ENCOUNTER — Other Ambulatory Visit (INDEPENDENT_AMBULATORY_CARE_PROVIDER_SITE_OTHER): Payer: Medicare Other

## 2012-12-08 ENCOUNTER — Ambulatory Visit (INDEPENDENT_AMBULATORY_CARE_PROVIDER_SITE_OTHER)
Admission: RE | Admit: 2012-12-08 | Discharge: 2012-12-08 | Disposition: A | Discharge status: Home / Self Care | Payer: Medicare Other | Source: Ambulatory Visit | Attending: Internal Medicine | Admitting: Internal Medicine

## 2012-12-08 DIAGNOSIS — R012 Other cardiac sounds: Secondary | ICD-10-CM

## 2012-12-08 DIAGNOSIS — I4891 Unspecified atrial fibrillation: Secondary | ICD-10-CM

## 2012-12-08 DIAGNOSIS — M545 Low back pain: Secondary | ICD-10-CM | POA: Diagnosis not present

## 2012-12-08 DIAGNOSIS — R5381 Other malaise: Secondary | ICD-10-CM

## 2012-12-08 DIAGNOSIS — R0609 Other forms of dyspnea: Secondary | ICD-10-CM

## 2012-12-08 DIAGNOSIS — Z86718 Personal history of other venous thrombosis and embolism: Secondary | ICD-10-CM

## 2012-12-08 DIAGNOSIS — IMO0002 Reserved for concepts with insufficient information to code with codable children: Secondary | ICD-10-CM | POA: Diagnosis not present

## 2012-12-08 DIAGNOSIS — R0600 Dyspnea, unspecified: Secondary | ICD-10-CM

## 2012-12-08 DIAGNOSIS — J449 Chronic obstructive pulmonary disease, unspecified: Secondary | ICD-10-CM | POA: Diagnosis not present

## 2012-12-08 LAB — CBC WITH DIFFERENTIAL/PLATELET
Basophils Relative: 0.6 % (ref 0.0–3.0)
Eosinophils Relative: 2.6 % (ref 0.0–5.0)
Hemoglobin: 12.9 g/dL — ABNORMAL LOW (ref 13.0–17.0)
Lymphocytes Relative: 27.7 % (ref 12.0–46.0)
Monocytes Relative: 12 % (ref 3.0–12.0)
Neutro Abs: 5.5 10*3/uL (ref 1.4–7.7)
RBC: 3.78 Mil/uL — ABNORMAL LOW (ref 4.22–5.81)
WBC: 9.6 10*3/uL (ref 4.5–10.5)

## 2012-12-08 LAB — SEDIMENTATION RATE: Sed Rate: 61 mm/hr — ABNORMAL HIGH (ref 0–22)

## 2012-12-08 LAB — POCT INR: INR: 2.4

## 2012-12-09 LAB — TSH: TSH: 1.44 u[IU]/mL (ref 0.35–5.50)

## 2012-12-09 LAB — BASIC METABOLIC PANEL
CO2: 28 mEq/L (ref 19–32)
Calcium: 9 mg/dL (ref 8.4–10.5)
Creatinine, Ser: 1.2 mg/dL (ref 0.4–1.5)
GFR: 62.06 mL/min (ref >60.00)
Potassium: 4.6 mEq/L (ref 3.5–5.1)
Sodium: 138 mEq/L (ref 135–145)

## 2012-12-13 ENCOUNTER — Other Ambulatory Visit: Payer: Self-pay

## 2012-12-13 DIAGNOSIS — M545 Low back pain: Secondary | ICD-10-CM | POA: Diagnosis not present

## 2012-12-13 DIAGNOSIS — IMO0002 Reserved for concepts with insufficient information to code with codable children: Secondary | ICD-10-CM | POA: Diagnosis not present

## 2012-12-13 MED ORDER — FLECAINIDE ACETATE 150 MG PO TABS: 150.0000 mg | ORAL_TABLET | Freq: Two times a day (BID) | ORAL | Status: DC

## 2012-12-16 DIAGNOSIS — M545 Low back pain: Secondary | ICD-10-CM | POA: Diagnosis not present

## 2012-12-16 DIAGNOSIS — IMO0002 Reserved for concepts with insufficient information to code with codable children: Secondary | ICD-10-CM | POA: Diagnosis not present

## 2012-12-19 ENCOUNTER — Other Ambulatory Visit: Payer: Self-pay

## 2012-12-19 MED ORDER — FLECAINIDE ACETATE 150 MG PO TABS: 150.0000 mg | ORAL_TABLET | Freq: Two times a day (BID) | ORAL | Status: DC

## 2012-12-25 ENCOUNTER — Other Ambulatory Visit: Payer: Self-pay | Admitting: Cardiology

## 2012-12-27 ENCOUNTER — Ambulatory Visit (INDEPENDENT_AMBULATORY_CARE_PROVIDER_SITE_OTHER): Payer: Medicare Other | Admitting: Internal Medicine

## 2012-12-27 ENCOUNTER — Encounter: Payer: Self-pay | Admitting: Internal Medicine

## 2012-12-27 VITALS — BP 136/90 | HR 86 | Temp 97.5°F | Resp 20 | Wt 275.8 lb

## 2012-12-27 DIAGNOSIS — M25579 Pain in unspecified ankle and joints of unspecified foot: Secondary | ICD-10-CM | POA: Diagnosis not present

## 2012-12-27 DIAGNOSIS — M25571 Pain in right ankle and joints of right foot: Secondary | ICD-10-CM

## 2012-12-27 MED ORDER — PREDNISONE 20 MG PO TABS: 20.0000 mg | ORAL_TABLET | Freq: Two times a day (BID) | ORAL | Status: DC

## 2012-12-27 NOTE — Patient Instructions (Signed)
Order for x-rays entered into  the computer; these will be performed at [address] across from [facility]. No appointment is necessary. 

## 2012-12-27 NOTE — Progress Notes (Signed)
  Subjective:    Patient ID: [name] [name], male    DOB: 1945-07-19, 67 y.o.   MRN: [id number]  HPI  He developed acute pain in the right ankle anteriorly extending from the medial to the lateral malleolus upon arising 12/24/12. There was no trigger or injury for the pain and it was not present the evening of 10/17  He's continued to have pain in this area when he is bearing weight or rotating the ankle. The pain goes away when he sits & especially when he  raises the leg.  10/20 his wife did note some redness in the area of a sock from the distal shin to the ankle in this area.  He does have a past history of gout     Review of Systems   He denies fever, chills, sweats, or fixed rash in this area. There is no numbness, tingling, weakness. It is simply a matter of pain precluding ambulation, not limb weakness      Objective:   Physical Exam  He appears well nourished; he is obviously uncomfortable  He has no lymphadenopathy about the neck or axilla.  There is good range of motion of the neck. Hands reveal no significant arthritic changes  There is visible swelling of both malleolar areas with exquisite tenderness to palpation.  There is no significant erythema or change in temperature to palpation in this area.  Homans sign is negative        Assessment & Plan:   #1 pain and swelling in the right ankle without trigger or injury. This is in the context of past medical history of gout  Plan: See orders

## 2012-12-28 ENCOUNTER — Ambulatory Visit (INDEPENDENT_AMBULATORY_CARE_PROVIDER_SITE_OTHER)
Admission: RE | Admit: 2012-12-28 | Discharge: 2012-12-28 | Disposition: A | Discharge status: Home / Self Care | Payer: Medicare Other | Source: Ambulatory Visit | Attending: Internal Medicine | Admitting: Internal Medicine

## 2012-12-28 DIAGNOSIS — M25571 Pain in right ankle and joints of right foot: Secondary | ICD-10-CM

## 2012-12-28 DIAGNOSIS — M7989 Other specified soft tissue disorders: Secondary | ICD-10-CM | POA: Diagnosis not present

## 2012-12-28 DIAGNOSIS — M25579 Pain in unspecified ankle and joints of unspecified foot: Secondary | ICD-10-CM | POA: Diagnosis not present

## 2012-12-28 LAB — URIC ACID: Uric Acid, Serum: 7.3 mg/dL (ref 4.0–7.8)

## 2013-01-05 ENCOUNTER — Ambulatory Visit (INDEPENDENT_AMBULATORY_CARE_PROVIDER_SITE_OTHER): Payer: Medicare Other | Admitting: Pharmacist

## 2013-01-05 DIAGNOSIS — Z86718 Personal history of other venous thrombosis and embolism: Secondary | ICD-10-CM | POA: Diagnosis not present

## 2013-01-05 DIAGNOSIS — I4891 Unspecified atrial fibrillation: Secondary | ICD-10-CM | POA: Diagnosis not present

## 2013-01-12 ENCOUNTER — Other Ambulatory Visit: Payer: Self-pay

## 2013-01-17 ENCOUNTER — Ambulatory Visit (INDEPENDENT_AMBULATORY_CARE_PROVIDER_SITE_OTHER): Payer: Medicare Other

## 2013-01-17 DIAGNOSIS — M25569 Pain in unspecified knee: Secondary | ICD-10-CM | POA: Diagnosis not present

## 2013-01-17 DIAGNOSIS — M545 Low back pain: Secondary | ICD-10-CM | POA: Diagnosis not present

## 2013-01-17 DIAGNOSIS — Z23 Encounter for immunization: Secondary | ICD-10-CM | POA: Diagnosis not present

## 2013-01-17 DIAGNOSIS — M6281 Muscle weakness (generalized): Secondary | ICD-10-CM | POA: Diagnosis not present

## 2013-01-17 DIAGNOSIS — IMO0002 Reserved for concepts with insufficient information to code with codable children: Secondary | ICD-10-CM | POA: Diagnosis not present

## 2013-01-19 DIAGNOSIS — M25569 Pain in unspecified knee: Secondary | ICD-10-CM | POA: Diagnosis not present

## 2013-01-19 DIAGNOSIS — M6281 Muscle weakness (generalized): Secondary | ICD-10-CM | POA: Diagnosis not present

## 2013-01-19 DIAGNOSIS — M545 Low back pain: Secondary | ICD-10-CM | POA: Diagnosis not present

## 2013-01-19 DIAGNOSIS — IMO0002 Reserved for concepts with insufficient information to code with codable children: Secondary | ICD-10-CM | POA: Diagnosis not present

## 2013-01-24 ENCOUNTER — Other Ambulatory Visit: Payer: Self-pay | Admitting: Cardiology

## 2013-01-24 DIAGNOSIS — M545 Low back pain: Secondary | ICD-10-CM | POA: Diagnosis not present

## 2013-01-24 DIAGNOSIS — M6281 Muscle weakness (generalized): Secondary | ICD-10-CM | POA: Diagnosis not present

## 2013-01-24 DIAGNOSIS — IMO0002 Reserved for concepts with insufficient information to code with codable children: Secondary | ICD-10-CM | POA: Diagnosis not present

## 2013-01-24 DIAGNOSIS — M25569 Pain in unspecified knee: Secondary | ICD-10-CM | POA: Diagnosis not present

## 2013-01-31 DIAGNOSIS — IMO0002 Reserved for concepts with insufficient information to code with codable children: Secondary | ICD-10-CM | POA: Diagnosis not present

## 2013-01-31 DIAGNOSIS — M545 Low back pain: Secondary | ICD-10-CM | POA: Diagnosis not present

## 2013-01-31 DIAGNOSIS — M6281 Muscle weakness (generalized): Secondary | ICD-10-CM | POA: Diagnosis not present

## 2013-01-31 DIAGNOSIS — M25569 Pain in unspecified knee: Secondary | ICD-10-CM | POA: Diagnosis not present

## 2013-02-09 ENCOUNTER — Ambulatory Visit (INDEPENDENT_AMBULATORY_CARE_PROVIDER_SITE_OTHER): Payer: Medicare Other | Admitting: Pharmacist

## 2013-02-09 DIAGNOSIS — I4891 Unspecified atrial fibrillation: Secondary | ICD-10-CM

## 2013-02-09 DIAGNOSIS — M545 Low back pain: Secondary | ICD-10-CM | POA: Diagnosis not present

## 2013-02-09 DIAGNOSIS — Z86718 Personal history of other venous thrombosis and embolism: Secondary | ICD-10-CM | POA: Diagnosis not present

## 2013-02-09 DIAGNOSIS — M6281 Muscle weakness (generalized): Secondary | ICD-10-CM | POA: Diagnosis not present

## 2013-02-09 DIAGNOSIS — IMO0002 Reserved for concepts with insufficient information to code with codable children: Secondary | ICD-10-CM | POA: Diagnosis not present

## 2013-02-09 DIAGNOSIS — M25569 Pain in unspecified knee: Secondary | ICD-10-CM | POA: Diagnosis not present

## 2013-02-14 DIAGNOSIS — M25569 Pain in unspecified knee: Secondary | ICD-10-CM | POA: Diagnosis not present

## 2013-02-14 DIAGNOSIS — M6281 Muscle weakness (generalized): Secondary | ICD-10-CM | POA: Diagnosis not present

## 2013-02-14 DIAGNOSIS — M545 Low back pain: Secondary | ICD-10-CM | POA: Diagnosis not present

## 2013-02-14 DIAGNOSIS — IMO0002 Reserved for concepts with insufficient information to code with codable children: Secondary | ICD-10-CM | POA: Diagnosis not present

## 2013-02-16 DIAGNOSIS — IMO0002 Reserved for concepts with insufficient information to code with codable children: Secondary | ICD-10-CM | POA: Diagnosis not present

## 2013-02-16 DIAGNOSIS — M6281 Muscle weakness (generalized): Secondary | ICD-10-CM | POA: Diagnosis not present

## 2013-02-16 DIAGNOSIS — M545 Low back pain: Secondary | ICD-10-CM | POA: Diagnosis not present

## 2013-02-16 DIAGNOSIS — M25569 Pain in unspecified knee: Secondary | ICD-10-CM | POA: Diagnosis not present

## 2013-02-20 ENCOUNTER — Other Ambulatory Visit: Payer: Self-pay | Admitting: Internal Medicine

## 2013-02-21 NOTE — Telephone Encounter (Signed)
Last OV 12-27-12 Med filled 10/03/12 #30 with 1  [facility] on file

## 2013-02-21 NOTE — Telephone Encounter (Signed)
Ok X1 

## 2013-02-22 ENCOUNTER — Other Ambulatory Visit: Payer: Self-pay | Admitting: Internal Medicine

## 2013-02-22 DIAGNOSIS — F411 Generalized anxiety disorder: Secondary | ICD-10-CM

## 2013-02-22 DIAGNOSIS — F329 Major depressive disorder, single episode, unspecified: Secondary | ICD-10-CM

## 2013-02-22 MED ORDER — LORAZEPAM 0.5 MG PO TABS: ORAL_TABLET | ORAL | Status: DC

## 2013-02-22 NOTE — Telephone Encounter (Signed)
Refill for zoloft sent to [facility] and Rx for Lorazepam faxed to [facility]

## 2013-02-23 ENCOUNTER — Other Ambulatory Visit: Payer: Self-pay | Admitting: Internal Medicine

## 2013-03-09 DIAGNOSIS — Z9289 Personal history of other medical treatment: Secondary | ICD-10-CM

## 2013-03-09 HISTORY — DX: Personal history of other medical treatment: Z92.89

## 2013-03-17 ENCOUNTER — Other Ambulatory Visit: Payer: Self-pay | Admitting: Internal Medicine

## 2013-03-17 NOTE — Telephone Encounter (Signed)
Last seen-12/27/2012  Last filled-02/22/2013  UDS-03/11/2012 low risk, contract signed  Please advise. SW

## 2013-03-17 NOTE — Telephone Encounter (Signed)
OK X1 

## 2013-03-21 ENCOUNTER — Ambulatory Visit (INDEPENDENT_AMBULATORY_CARE_PROVIDER_SITE_OTHER): Payer: Medicare Other | Admitting: Family Medicine

## 2013-03-21 ENCOUNTER — Encounter: Payer: Self-pay | Admitting: Family Medicine

## 2013-03-21 VITALS — BP 130/78 | HR 63 | Temp 98.6°F | Wt 283.0 lb

## 2013-03-21 DIAGNOSIS — J449 Chronic obstructive pulmonary disease, unspecified: Secondary | ICD-10-CM

## 2013-03-21 DIAGNOSIS — J45909 Unspecified asthma, uncomplicated: Secondary | ICD-10-CM

## 2013-03-21 DIAGNOSIS — E669 Obesity, unspecified: Secondary | ICD-10-CM | POA: Insufficient documentation

## 2013-03-21 MED ORDER — BUDESONIDE-FORMOTEROL FUMARATE 160-4.5 MCG/ACT IN AERO: 2.0000 | INHALATION_SPRAY | Freq: Two times a day (BID) | RESPIRATORY_TRACT | Status: DC

## 2013-03-21 MED ORDER — ALBUTEROL SULFATE HFA 108 (90 BASE) MCG/ACT IN AERS: 2.0000 | INHALATION_SPRAY | Freq: Four times a day (QID) | RESPIRATORY_TRACT | Status: DC | PRN

## 2013-03-21 NOTE — Assessment & Plan Note (Signed)
Stable , no problems Samples of symbicort and proair given to pt

## 2013-03-21 NOTE — Patient Instructions (Signed)
Today your exam was normal but samples of inhalers were given to you in case your cough worsens    Chronic Obstructive Pulmonary Disease Chronic obstructive pulmonary disease (COPD) is a common lung condition in which airflow from the lungs is limited. COPD is a general term that can be used to describe many different lung problems that limit airflow, including both chronic bronchitis and emphysema. If you have COPD, your lung function will probably never return to normal, but there are measures you can take to improve lung function and make yourself feel better.  CAUSES   Smoking (common).   Exposure to secondhand smoke.   Genetic problems.  Chronic inflammatory lung diseases or recurrent infections. SYMPTOMS   Shortness of breath, especially with physical activity.   Deep, persistent (chronic) cough with a large amount of thick mucus.   Wheezing.   Rapid breaths (tachypnea).   Gray or bluish discoloration (cyanosis) of the skin, especially in fingers, toes, or lips.   Fatigue.   Weight loss.   Frequent infections or episodes when breathing symptoms become much worse (exacerbations).   Chest tightness. DIAGNOSIS  Your healthcare provider will take a medical history and perform a physical examination to make the initial diagnosis. Additional tests for COPD may include:   Lung (pulmonary) function tests.  Chest X-ray.  CT scan.  Blood tests. TREATMENT  Treatment available to help you feel better when you have COPD include:   Inhaler and nebulizer medicines. These help manage the symptoms of COPD and make your breathing more comfortable  Supplemental oxygen. Supplemental oxygen is only helpful if you have a low oxygen level in your blood.   Exercise and physical activity. These are beneficial for nearly all people with COPD. Some people may also benefit from a pulmonary rehabilitation program. HOME CARE INSTRUCTIONS   Take all medicines (inhaled or  pills) as directed by your health care provider.  Only take over-the-counter or prescription medicines for pain, fever, or discomfort as directed by your health care provider.   Avoid over-the-counter medicines or cough syrups that dry up your airway (such as antihistamines) and slow down the elimination of secretions unless instructed otherwise by your healthcare provider.   If you are a smoker, the most important thing that you can do is stop smoking. Continuing to smoke will cause further lung damage and breathing trouble. Ask your health care provider for help with quitting smoking. He or she can direct you to community resources or hospitals that provide support.  Avoid exposure to irritants such as smoke, chemicals, and fumes that aggravate your breathing.  Use oxygen therapy and pulmonary rehabilitation if directed by your health care provider. If you require home oxygen therapy, ask your healthcare provider whether you should purchase a pulse oximeter to measure your oxygen level at home.   Avoid contact with individuals who have a contagious illness.  Avoid extreme temperature and humidity changes.  Eat healthy foods. Eating smaller, more frequent meals and resting before meals may help you maintain your strength.  Stay active, but balance activity with periods of rest. Exercise and physical activity will help you maintain your ability to do things you want to do.  Preventing infection and hospitalization is very important when you have COPD. Make sure to receive all the vaccines your health care provider recommends, especially the pneumococcal and influenza vaccines. Ask your healthcare provider whether you need a pneumonia vaccine.  Learn and use relaxation techniques to manage stress.  Learn and use controlled  breathing techniques as directed by your health care provider. Controlled breathing techniques include:   Pursed lip breathing. Start by breathing in (inhaling)  through your nose for 1 second. Then, purse your lips as if you were going to whistle and breathe out (exhale) through the pursed lips for 2 seconds.   Diaphragmatic breathing. Start by putting one hand on your abdomen just above your waist. Inhale slowly through your nose. The hand on your abdomen should move out. Then purse your lips and exhale slowly. You should be able to feel the hand on your abdomen moving in as you exhale.   Learn and use controlled coughing to clear mucus from your lungs. Controlled coughing is a series of short, progressive coughs. The steps of controlled coughing are:  1. Lean your head slightly forward.  2. Breathe in deeply using diaphragmatic breathing.  3. Try to hold your breath for 3 seconds.  4. Keep your mouth slightly open while coughing twice.  5. Spit any mucus out into a tissue.  6. Rest and repeat the steps once or twice as needed. SEEK MEDICAL CARE IF:   You are coughing up more mucus than usual.   There is a change in the color or thickness of your mucus.   Your breathing is more labored than usual.   Your breathing is faster than usual.  SEEK IMMEDIATE MEDICAL CARE IF:   You have shortness of breath while you are resting.   You have shortness of breath that prevents you from:  Being able to talk.   Performing your usual physical activities.   You have chest pain lasting longer than 5 minutes.   Your skin color is more cyanotic than usual.  You measure low oxygen saturations for longer than 5 minutes with a pulse oximeter. MAKE SURE YOU:   Understand these instructions.  Will watch your condition.  Will get help right away if you are not doing well or get worse. Document Released: 12/03/2004 Document Revised: 12/14/2012 Document Reviewed: 10/20/2012 [facility] Patient Information 2014 [place], [place].

## 2013-03-21 NOTE — Progress Notes (Signed)
Pre visit review using our clinic review tool, if applicable. No additional management support is needed unless otherwise documented below in the visit note. 

## 2013-03-21 NOTE — Assessment & Plan Note (Deleted)
Stable ,no problems Samples of symbicort and proair given to pt for use when needed

## 2013-03-21 NOTE — Progress Notes (Signed)
   Subjective:    Patient ID: [name] [name], male    DOB: [birth year], 68 y.o.   MRN: [id number]  HPI Pt here because his wife wanted him to be seen.  Pt states there is nothing wrong.   Review of Systems    as above Objective:   Physical Exam  BP 130/78  Pulse 63  Temp(Src) 98.6 F (37 C) (Oral)  Wt 283 lb (128.368 kg)  SpO2 96% General appearance: alert, cooperative, appears stated age and no distress Throat: lips, mucosa, and tongue normal; teeth and gums normal Neck: no adenopathy, supple, symmetrical, trachea midline and thyroid not enlarged, symmetric, no tenderness/mass/nodules Lungs: clear to auscultation bilaterally Heart: S1, S2 normal      Assessment & Plan:

## 2013-03-22 ENCOUNTER — Telehealth: Payer: Self-pay | Admitting: Internal Medicine

## 2013-03-22 NOTE — Telephone Encounter (Signed)
Relevant patient education assigned to patient using Emmi. ° °

## 2013-03-23 ENCOUNTER — Ambulatory Visit (INDEPENDENT_AMBULATORY_CARE_PROVIDER_SITE_OTHER): Payer: Medicare Other | Admitting: *Deleted

## 2013-03-23 DIAGNOSIS — Z86718 Personal history of other venous thrombosis and embolism: Secondary | ICD-10-CM | POA: Diagnosis not present

## 2013-03-23 DIAGNOSIS — I4891 Unspecified atrial fibrillation: Secondary | ICD-10-CM | POA: Diagnosis not present

## 2013-03-23 LAB — POCT INR: INR: 2.6

## 2013-03-28 DIAGNOSIS — E669 Obesity, unspecified: Secondary | ICD-10-CM | POA: Diagnosis not present

## 2013-03-28 DIAGNOSIS — M542 Cervicalgia: Secondary | ICD-10-CM | POA: Diagnosis not present

## 2013-04-03 ENCOUNTER — Telehealth: Payer: Self-pay

## 2013-04-03 NOTE — Telephone Encounter (Signed)
Medication List and allergies:  Reviewed and updated (has one new med that he will bring to visit)  90 day supply/mail order: na Local prescriptions: [facility]  Immunizations due: admin shingles vaccine at visit   A/P:   No changes to FH, PSH or Personal Hx Flu vaccine--01/2013 Tdap--04/2007 PNA--2011 Shingles--would like today [facility]--09/2011--Dr Kaplan--adenomatous polyps/limited view--next 2016 PSA--not noted  To Discuss with Provider: Why are you leaving?

## 2013-04-04 ENCOUNTER — Encounter: Payer: Self-pay | Admitting: Internal Medicine

## 2013-04-04 ENCOUNTER — Ambulatory Visit (INDEPENDENT_AMBULATORY_CARE_PROVIDER_SITE_OTHER): Payer: Medicare Other | Admitting: Internal Medicine

## 2013-04-04 VITALS — BP 105/65 | HR 54 | Temp 97.9°F | Ht 72.75 in | Wt 284.6 lb

## 2013-04-04 DIAGNOSIS — I10 Essential (primary) hypertension: Secondary | ICD-10-CM | POA: Diagnosis not present

## 2013-04-04 DIAGNOSIS — R350 Frequency of micturition: Secondary | ICD-10-CM | POA: Diagnosis not present

## 2013-04-04 DIAGNOSIS — E785 Hyperlipidemia, unspecified: Secondary | ICD-10-CM | POA: Diagnosis not present

## 2013-04-04 DIAGNOSIS — D126 Benign neoplasm of colon, unspecified: Secondary | ICD-10-CM

## 2013-04-04 DIAGNOSIS — Z0000 Encounter for general adult medical examination without abnormal findings: Secondary | ICD-10-CM

## 2013-04-04 LAB — CBC WITH DIFFERENTIAL/PLATELET
BASOS PCT: 0.4 % (ref 0.0–3.0)
Basophils Absolute: 0.1 10*3/uL (ref 0.0–0.1)
EOS PCT: 3.1 % (ref 0.0–5.0)
Eosinophils Absolute: 0.4 10*3/uL (ref 0.0–0.7)
HCT: 38.8 % — ABNORMAL LOW (ref 39.0–52.0)
Hemoglobin: 13.1 g/dL (ref 13.0–17.0)
Lymphocytes Relative: 24.1 % (ref 12.0–46.0)
Lymphs Abs: 3.2 10*3/uL (ref 0.7–4.0)
MCHC: 33.8 g/dL (ref 30.0–36.0)
MCV: 100.2 fl — AB (ref 78.0–100.0)
MONOS PCT: 9.2 % (ref 3.0–12.0)
Monocytes Absolute: 1.2 10*3/uL — ABNORMAL HIGH (ref 0.1–1.0)
NEUTROS PCT: 63.2 % (ref 43.0–77.0)
Neutro Abs: 8.5 10*3/uL — ABNORMAL HIGH (ref 1.4–7.7)
Platelets: 460 10*3/uL — ABNORMAL HIGH (ref 150.0–400.0)
RBC: 3.87 Mil/uL — AB (ref 4.22–5.81)
RDW: 16.1 % — ABNORMAL HIGH (ref 11.5–14.6)
WBC: 13.4 10*3/uL — ABNORMAL HIGH (ref 4.5–10.5)

## 2013-04-04 LAB — LIPID PANEL
CHOL/HDL RATIO: 4
Cholesterol: 184 mg/dL (ref 0–200)
HDL: 41.8 mg/dL (ref >39.00)
LDL Cholesterol: 110 mg/dL — ABNORMAL HIGH (ref 0–99)
Triglycerides: 160 mg/dL — ABNORMAL HIGH (ref 0.0–149.0)
VLDL: 32 mg/dL (ref 0.0–40.0)

## 2013-04-04 LAB — BASIC METABOLIC PANEL
BUN: 22 mg/dL (ref 6–23)
CO2: 24 mEq/L (ref 19–32)
CREATININE: 1.3 mg/dL (ref 0.4–1.5)
Calcium: 9 mg/dL (ref 8.4–10.5)
Chloride: 107 mEq/L (ref 96–112)
GFR: 56.66 mL/min — AB (ref >60.00)
Glucose, Bld: 106 mg/dL — ABNORMAL HIGH (ref 70–99)
POTASSIUM: 4.9 meq/L (ref 3.5–5.1)
Sodium: 140 mEq/L (ref 135–145)

## 2013-04-04 LAB — TSH: TSH: 3.36 u[IU]/mL (ref 0.35–5.50)

## 2013-04-04 LAB — HEPATIC FUNCTION PANEL
ALT: 36 U/L (ref 0–53)
AST: 34 U/L (ref 0–37)
Albumin: 4.2 g/dL (ref 3.5–5.2)
Alkaline Phosphatase: 82 U/L (ref 39–117)
Bilirubin, Direct: 0 mg/dL (ref 0.0–0.3)
TOTAL PROTEIN: 7.6 g/dL (ref 6.0–8.3)
Total Bilirubin: 0.7 mg/dL (ref 0.3–1.2)

## 2013-04-04 NOTE — Progress Notes (Signed)
Subjective:    Patient ID: [name] [name], male    DOB: February 12, 1946, 68 y.o.   MRN: [id number]  HPI Medicare Wellness Visit: Psychosocial and medical history were reviewed as required by Medicare (history related to abuse, antisocial behavior , firearm risk). Social history: Caffeine: 2-3 cups/ day  , Alcohol: 1-2 / month  , Tobacco use: quit 2002 Exercise:decreased due to pain in LS spine walking Personal safety/fall risk:no Limitations of activities of daily living:no Seatbelt/ smoke alarm use:yes Healthcare Power of Attorney/Living Will status: in place Ophthalmologic exam status:UTD Hearing evaluation status:UTD Orientation: Oriented X 3 Memory and recall: good Spelling or math testing: good Depression/anxiety assessment: no Foreign travel history:2000 [place] Immunization status for influenza/pneumonia/ shingles /tetanus:Shingles needed Transfusion history:no Preventive health care maintenance status: Colonoscopy as per protocol/standard care:UTD Dental care:every 6 mos Chart reviewed and updated. Active issues reviewed and addressed as documented below.    Review of Systems   Blood pressure range  < 130/70-80. Compliant with anti hypertemsive medication. No lightheadedness or other adverse medication effect described.  Significant headaches, epistaxis, chest pain, palpitations, exertional dyspnea, claudication, paroxysmal nocturnal dyspnea, or edema absent.frequent urination & nocturia 3-6 X/ night.      Objective:   Physical Exam  Gen.: Healthy and well-nourished in appearance. Alert, appropriate and cooperative throughout exam. Appears younger than stated age  Head: Normocephalic without obvious abnormalities;  o alopecia  Eyes: No corneal or conjunctival inflammation noted. Pupils equal round reactive to light and accommodation. Extraocular motion intact.  Ears: External  ear exam reveals no significant lesions or deformities. Canals clear .TMs normal. Hearing is  grossly decreased on L. Nose: External nasal exam reveals no deformity or inflammation. Nasal mucosa are pink and moist. No lesions or exudates noted.   Mouth: Oral mucosa and oropharynx reveal no lesions or exudates. Teeth in good repair. Neck: No deformities, masses, or tenderness noted. Range of motion &. Thyroid normal. Lungs: Normal respiratory effort; chest expands symmetrically. Lungs :mild scattered wheezing  W/o rales, wheezes, or increased work of breathing. Heart: Slow  rate and irregular  rhythm. Normal S1 and S2. No gallop, click, or rub. . Abdomen: Bowel sounds normal; abdomen soft and nontender. No masses, organomegaly or hernias noted.Protuberant Genitalia: Genitalia normal except for left varices. Prostate is normal without enlargement, asymmetry, nodularity, or induration                                   Musculoskeletal/extremities: No deformity or scoliosis noted of  the thoracic or lumbar spine.   No clubbing, cyanosis, edema, or significant extremity  deformity noted. Range of motion normal .Tone & strength normal. Hand joints normal . Fingernail  health good. Able to lie down & sit up w/o help. Negative SLR bilaterally Vascular: Carotid, radial artery, dorsalis pedis and  posterior tibial pulses are full and equal. No bruits present. Neurologic: Alert and oriented x3. Deep tendon reflexes asymmetrical @ knees. L knee  0+     Skin: Intact without suspicious lesions or rashes. Lymph: No cervical, axillary, or inguinal lymphadenopathy present. Psych: Mood and affect are normal. Normally interactive  Assessment & Plan:  #1 Medicare Wellness Exam; criteria met ; data entered #2 Problem List/Diagnoses reviewed Plan:  Assessments made/ Orders entered  

## 2013-04-04 NOTE — Progress Notes (Signed)
Pre visit review using our clinic review tool, if applicable. No additional management support is needed unless otherwise documented below in the visit note. 

## 2013-04-04 NOTE — Patient Instructions (Signed)
Your next office appointment will be determined based upon review of your pending labs . Those instructions will be transmitted to you through My Chart  or by mail if you're not using this system.   

## 2013-04-05 ENCOUNTER — Telehealth: Payer: Self-pay | Admitting: Internal Medicine

## 2013-04-05 DIAGNOSIS — Z79899 Other long term (current) drug therapy: Secondary | ICD-10-CM | POA: Diagnosis not present

## 2013-04-05 NOTE — Telephone Encounter (Signed)
Relevant patient education assigned to patient using Emmi. ° °

## 2013-04-06 ENCOUNTER — Ambulatory Visit: Payer: Medicare Other

## 2013-04-06 DIAGNOSIS — R7309 Other abnormal glucose: Secondary | ICD-10-CM

## 2013-04-06 LAB — HEMOGLOBIN A1C: HEMOGLOBIN A1C: 6.4 % (ref 4.6–6.5)

## 2013-04-07 ENCOUNTER — Telehealth: Payer: Self-pay | Admitting: *Deleted

## 2013-04-07 NOTE — Telephone Encounter (Signed)
Spoke with patients wife who advised that the patient has been constipated for a few days and wanted to know what she can do. I advised her to give 1/2-1 capful of miralax in 8 oz of liquid or colace tablet every 8 hours prn. Advised to increase liquids as much as possible to soften stool for easier passage. Told wife to watch for nausea and if this occurs, to have him evaluated in the ER to r/o obstruction. Also advised to call [place] office in the morning if no results to have patient seen.

## 2013-04-10 ENCOUNTER — Encounter: Payer: Self-pay | Admitting: *Deleted

## 2013-04-19 ENCOUNTER — Encounter (HOSPITAL_COMMUNITY): Payer: Self-pay | Admitting: Emergency Medicine

## 2013-04-19 ENCOUNTER — Telehealth: Payer: Self-pay | Admitting: *Deleted

## 2013-04-19 ENCOUNTER — Emergency Department (HOSPITAL_COMMUNITY): Payer: Medicare Other

## 2013-04-19 ENCOUNTER — Emergency Department (HOSPITAL_COMMUNITY)
Admission: EM | Admit: 2013-04-19 | Discharge: 2013-04-19 | Disposition: A | Discharge status: Home / Self Care | Payer: Medicare Other | Attending: Emergency Medicine | Admitting: Emergency Medicine

## 2013-04-19 DIAGNOSIS — Z79899 Other long term (current) drug therapy: Secondary | ICD-10-CM | POA: Insufficient documentation

## 2013-04-19 DIAGNOSIS — I4891 Unspecified atrial fibrillation: Secondary | ICD-10-CM | POA: Insufficient documentation

## 2013-04-19 DIAGNOSIS — Z791 Long term (current) use of non-steroidal anti-inflammatories (NSAID): Secondary | ICD-10-CM | POA: Insufficient documentation

## 2013-04-19 DIAGNOSIS — Z8739 Personal history of other diseases of the musculoskeletal system and connective tissue: Secondary | ICD-10-CM | POA: Insufficient documentation

## 2013-04-19 DIAGNOSIS — J449 Chronic obstructive pulmonary disease, unspecified: Secondary | ICD-10-CM | POA: Insufficient documentation

## 2013-04-19 DIAGNOSIS — Z87891 Personal history of nicotine dependence: Secondary | ICD-10-CM | POA: Diagnosis not present

## 2013-04-19 DIAGNOSIS — Z7901 Long term (current) use of anticoagulants: Secondary | ICD-10-CM | POA: Insufficient documentation

## 2013-04-19 DIAGNOSIS — Z862 Personal history of diseases of the blood and blood-forming organs and certain disorders involving the immune mechanism: Secondary | ICD-10-CM | POA: Diagnosis not present

## 2013-04-19 DIAGNOSIS — J45909 Unspecified asthma, uncomplicated: Secondary | ICD-10-CM | POA: Diagnosis not present

## 2013-04-19 DIAGNOSIS — Z8669 Personal history of other diseases of the nervous system and sense organs: Secondary | ICD-10-CM | POA: Insufficient documentation

## 2013-04-19 DIAGNOSIS — M549 Dorsalgia, unspecified: Secondary | ICD-10-CM | POA: Diagnosis not present

## 2013-04-19 DIAGNOSIS — J4489 Other specified chronic obstructive pulmonary disease: Secondary | ICD-10-CM | POA: Insufficient documentation

## 2013-04-19 DIAGNOSIS — Z880 Allergy status to penicillin: Secondary | ICD-10-CM | POA: Diagnosis not present

## 2013-04-19 DIAGNOSIS — M545 Low back pain, unspecified: Secondary | ICD-10-CM | POA: Diagnosis not present

## 2013-04-19 DIAGNOSIS — K219 Gastro-esophageal reflux disease without esophagitis: Secondary | ICD-10-CM | POA: Diagnosis not present

## 2013-04-19 DIAGNOSIS — K4091 Unilateral inguinal hernia, without obstruction or gangrene, recurrent: Secondary | ICD-10-CM | POA: Diagnosis not present

## 2013-04-19 DIAGNOSIS — I10 Essential (primary) hypertension: Secondary | ICD-10-CM | POA: Insufficient documentation

## 2013-04-19 DIAGNOSIS — Z87448 Personal history of other diseases of urinary system: Secondary | ICD-10-CM | POA: Insufficient documentation

## 2013-04-19 LAB — COMPREHENSIVE METABOLIC PANEL
ALK PHOS: 91 U/L (ref 39–117)
ALT: 24 U/L (ref 0–53)
AST: 22 U/L (ref 0–37)
Albumin: 3.7 g/dL (ref 3.5–5.2)
BUN: 17 mg/dL (ref 6–23)
CALCIUM: 9 mg/dL (ref 8.4–10.5)
CHLORIDE: 99 meq/L (ref 96–112)
CO2: 23 meq/L (ref 19–32)
Creatinine, Ser: 1.08 mg/dL (ref 0.50–1.35)
GFR calc Af Amer: 79 mL/min — ABNORMAL LOW (ref >90)
GFR calc non Af Amer: 68 mL/min — ABNORMAL LOW (ref >90)
Glucose, Bld: 131 mg/dL — ABNORMAL HIGH (ref 70–99)
POTASSIUM: 4.5 meq/L (ref 3.7–5.3)
SODIUM: 136 meq/L — AB (ref 137–147)
Total Bilirubin: 0.3 mg/dL (ref 0.3–1.2)
Total Protein: 7.8 g/dL (ref 6.0–8.3)

## 2013-04-19 LAB — CBC WITH DIFFERENTIAL/PLATELET
BASOS ABS: 0.1 10*3/uL (ref 0.0–0.1)
Basophils Relative: 0 % (ref 0–1)
EOS PCT: 4 % (ref 0–5)
Eosinophils Absolute: 0.5 10*3/uL (ref 0.0–0.7)
HCT: 38 % — ABNORMAL LOW (ref 39.0–52.0)
Hemoglobin: 12.8 g/dL — ABNORMAL LOW (ref 13.0–17.0)
LYMPHS PCT: 19 % (ref 12–46)
Lymphs Abs: 2.6 10*3/uL (ref 0.7–4.0)
MCH: 34.2 pg — ABNORMAL HIGH (ref 26.0–34.0)
MCHC: 33.7 g/dL (ref 30.0–36.0)
MCV: 101.6 fL — ABNORMAL HIGH (ref 78.0–100.0)
Monocytes Absolute: 1 10*3/uL (ref 0.1–1.0)
Monocytes Relative: 8 % (ref 3–12)
NEUTROS ABS: 9.3 10*3/uL — AB (ref 1.7–7.7)
Neutrophils Relative %: 69 % (ref 43–77)
PLATELETS: 473 10*3/uL — AB (ref 150–400)
RBC: 3.74 MIL/uL — AB (ref 4.22–5.81)
RDW: 15.1 % (ref 11.5–15.5)
WBC: 13.4 10*3/uL — AB (ref 4.0–10.5)

## 2013-04-19 LAB — URINALYSIS, ROUTINE W REFLEX MICROSCOPIC
Bilirubin Urine: NEGATIVE
Glucose, UA: NEGATIVE mg/dL
HGB URINE DIPSTICK: NEGATIVE
Ketones, ur: NEGATIVE mg/dL
Nitrite: NEGATIVE
Protein, ur: NEGATIVE mg/dL
SPECIFIC GRAVITY, URINE: 1.03 (ref 1.005–1.030)
Urobilinogen, UA: 1 mg/dL (ref 0.0–1.0)
pH: 6 (ref 5.0–8.0)

## 2013-04-19 LAB — URINE MICROSCOPIC-ADD ON

## 2013-04-19 LAB — PROTIME-INR
INR: 2.5 — AB (ref 0.00–1.49)
PROTHROMBIN TIME: 26.2 s — AB (ref 11.6–15.2)

## 2013-04-19 LAB — LIPASE, BLOOD: Lipase: 32 U/L (ref 11–59)

## 2013-04-19 MED ORDER — OXYCODONE-ACETAMINOPHEN 5-325 MG PO TABS: 1.0000 | ORAL_TABLET | ORAL | Status: DC | PRN

## 2013-04-19 MED ORDER — SODIUM CHLORIDE 0.9 % IV SOLN
INTRAVENOUS | Status: DC
  Administered 2013-04-19: 16:00:00 via INTRAVENOUS

## 2013-04-19 MED ORDER — METHOCARBAMOL 100 MG/ML IJ SOLN
1000.0000 mg | Freq: Once | INTRAVENOUS | Status: AC
  Administered 2013-04-19: 1000 mg via INTRAVENOUS
  Filled 2013-04-19: qty 10

## 2013-04-19 MED ORDER — METHOCARBAMOL 100 MG/ML IJ SOLN: 1000.0000 mg | Freq: Once | INTRAMUSCULAR | Status: DC

## 2013-04-19 MED ORDER — HYDROMORPHONE HCL PF 1 MG/ML IJ SOLN
1.0000 mg | Freq: Once | INTRAMUSCULAR | Status: AC
  Administered 2013-04-19: 1 mg via INTRAVENOUS
  Filled 2013-04-19: qty 1

## 2013-04-19 MED ORDER — CYCLOBENZAPRINE HCL 10 MG PO TABS: 10.0000 mg | ORAL_TABLET | Freq: Two times a day (BID) | ORAL | Status: DC | PRN

## 2013-04-19 MED ORDER — IOHEXOL 300 MG/ML  SOLN
100.0000 mL | Freq: Once | INTRAMUSCULAR | Status: AC | PRN
  Administered 2013-04-19: 100 mL via INTRAVENOUS

## 2013-04-19 MED ORDER — IOHEXOL 300 MG/ML  SOLN
50.0000 mL | Freq: Once | INTRAMUSCULAR | Status: AC | PRN
  Administered 2013-04-19: 50 mL via ORAL

## 2013-04-19 MED ORDER — ONDANSETRON HCL 4 MG/2ML IJ SOLN
4.0000 mg | Freq: Once | INTRAMUSCULAR | Status: AC
  Administered 2013-04-19: 4 mg via INTRAVENOUS
  Filled 2013-04-19: qty 2

## 2013-04-19 NOTE — ED Notes (Signed)
Patient transported to CT 

## 2013-04-19 NOTE — ED Provider Notes (Signed)
CSN: [id number]     Arrival date & time 04/19/13  1334 History   First MD Initiated Contact with Patient 04/19/13 1556     Chief Complaint  Patient presents with  . Flank Pain     (Consider location/radiation/quality/duration/timing/severity/associated sxs/prior Treatment) HPI  68 year old male complaining of left flank pain that is nonradiating. He states it began yesterday afternoon then worsened when he bent over. Feels like it is like a baseball bat hitting him when he is sitting still but is sharp and stabbing when he moves. He states it is worse with bending over and when going from a standing to a sitting position. He feels best when he is standing. There is no radiation to his leg, buttock, or foot. He states he tried heat on the area last night and felt like he could not feel the heat in his flank area. He has no loss of sensation in the buttock or leg. He took some kind of old pain pills last night but is unable to tell me what they were. He states he is able to sleep through the night but then again had worsening pain today. He has had a two-week history of feeling like he cannot completely empty his bladder and some frequency of urination. He denies fever, hematuria, nausea, vomiting, or diarrhea. He is on Coumadin for atrial fibrillation and states that his last INR was 2.7.  Urine reviewed from yesterday and no obvious uti.   Past Medical History  Diagnosis Date  . BPH (benign prostatic hypertrophy)   . Spinal stenosis     congential  . Hypertension   . Asthma   . History of blood clots     in legs and lungs following back surgery in 2010  . Allergic rhinitis   . Atrial fibrillation   . 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 Cataract     REMOVED  . Clotting disorder     BLOOD CLOTS IN LEGS/LUNGS -2010  . COPD (chronic obstructive pulmonary disease)   . GERD (gastroesophageal reflux disease)   . Diverticulosis    Past Surgical History  Procedure Laterality Date  . Vasectomy     . Lumbar fusion      x2  . Total hip arthroplasty      bilat  . Cataracts    . Colonoscopy with polypectomy  2004   Family History  Problem Relation Age of Onset  . Atrial fibrillation Mother   . COPD Mother   . Heart disease Father   . Colon cancer Father   . Heart attack Brother 49  . Alcohol abuse Brother 15    died post liver transplant   History  Substance Use Topics  . Smoking status: Former Smoker -- 2.00 packs/day for 40 years    Types: Cigarettes    Quit date: 03/09/2000  . Smokeless tobacco: Never Used     Comment: smoked 1964-2002, up to 3-4 ppd  . Alcohol Use: Yes     Comment:  rarely    Review of Systems  All other systems reviewed and are negative.      Allergies  Penicillins  Home Medications   Current Outpatient Rx  Name  Route  Sig  Dispense  Refill  . albuterol (PROAIR HFA) 108 (90 BASE) MCG/ACT inhaler   Inhalation   Inhale 2 puffs into the lungs every 6 (six) hours as needed for wheezing or shortness of breath.         . budesonide-formoterol (SYMBICORT) 160-4.5  MCG/ACT inhaler   Inhalation   Inhale 2 puffs into the lungs 2 (two) times daily.   1 Inhaler   3   . diclofenac (VOLTAREN) 75 MG EC tablet   Oral   Take 1 tablet by mouth 2 (two) times daily.         [name] diltiazem (DILACOR XR) 240 MG 24 hr capsule   Oral   Take 240 mg by mouth at bedtime.         . flecainide (TAMBOCOR) 150 MG tablet   Oral   Take 1 tablet (150 mg total) by mouth 2 (two) times daily.   180 tablet   3   . LORazepam (ATIVAN) 0.5 MG tablet   Oral   Take 0.5 mg by mouth at bedtime as needed for anxiety or sleep.         . metoprolol succinate (TOPROL-XL) 25 MG 24 hr tablet   Oral   Take 25 mg by mouth at bedtime.         [name] omeprazole (PRILOSEC) 20 MG capsule   Oral   Take 20 mg by mouth at bedtime.         [name] PRESCRIPTION MEDICATION   Oral   Take 2 tablets by mouth once.         . sertraline (ZOLOFT) 50 MG tablet   Oral   Take  50 mg by mouth at bedtime.         [name] warfarin (COUMADIN) 3 MG tablet      Take 2 tabs (6mg ) on Tues, Thurs, Sun. And take 1&1/2 tabs (4.5mg ) all other days.          BP 107/68  Pulse 61  Temp(Src) 98.4 F (36.9 C) (Oral)  Resp 20  SpO2 96% Physical Exam  Nursing note and vitals reviewed. Constitutional: He is oriented to person, place, and time. He appears well-developed and well-nourished.  HENT:  Head: Normocephalic and atraumatic.  Right Ear: External ear normal.  Left Ear: External ear normal.  Nose: Nose normal.  Mouth/Throat: Oropharynx is clear and moist.  Eyes: Conjunctivae and EOM are normal. Pupils are equal, round, and reactive to light.  Neck: Normal range of motion. Neck supple.  Cardiovascular: Normal rate, normal heart sounds and intact distal pulses.   Pulmonary/Chest: Effort normal and breath sounds normal.  Abdominal: Soft. Bowel sounds are normal. He exhibits no distension and no mass. There is no tenderness. There is no rebound and no guarding.  Midline lower abdominal scar well-healed.  Musculoskeletal: Normal range of motion.  Patient with pain with sitting down and movement trunk. No palpable tenderness, erythema, or discoloration of left flank noted the  Neurological: He is alert and oriented to person, place, and time. He has normal reflexes. He displays normal reflexes. No cranial nerve deficit. He exhibits normal muscle tone. Coordination normal.  Skin: Skin is warm and dry. No rash noted. No erythema.  Psychiatric: He has a normal mood and affect. His behavior is normal. Judgment and thought content normal.    ED Course  Procedures (including critical care time) Labs Review Labs Reviewed  CBC WITH DIFFERENTIAL - Abnormal; Notable for the following:    WBC 13.4 (*)    RBC 3.74 (*)    Hemoglobin 12.8 (*)    HCT 38.0 (*)    MCV 101.6 (*)    MCH 34.2 (*)    Platelets 473 (*)    Neutro Abs 9.3 (*)    All other components  within normal limits   COMPREHENSIVE METABOLIC PANEL - Abnormal; Notable for the following:    Sodium 136 (*)    Glucose, Bld 131 (*)    GFR calc non Af Amer 68 (*)    GFR calc Af Amer 79 (*)    All other components within normal limits  LIPASE, BLOOD  URINALYSIS, ROUTINE W REFLEX MICROSCOPIC  PROTIME-INR   Imaging Review Ct Abdomen Pelvis W Contrast  04/19/2013   CLINICAL DATA:  Left flank pain.  On Coumadin.  EXAM: CT ABDOMEN AND PELVIS WITH CONTRAST  TECHNIQUE: Multidetector CT imaging of the abdomen and pelvis was performed using the standard protocol following bolus administration of intravenous contrast.  CONTRAST:  32mL OMNIPAQUE IOHEXOL 300 MG/ML SOLN, 166mL OMNIPAQUE IOHEXOL 300 MG/ML SOLN  COMPARISON:  CT L SPINE W/CM dated 06/20/2010; MR L SPINE W/O dated 03/15/2008  FINDINGS: There is linear scarring at the right lung base. The lung bases are otherwise clear. There is no pleural or pericardial effusion.  1.4 cm low-density lesion in the posterior segment of the right hepatic lobe on image 25 is likely a cyst. The liver otherwise appears normal. The gallbladder, spleen, pancreas and adrenal glands appear normal. There are low density renal lesions bilaterally which are likely cysts. The largest is in the interpolar region of the right kidney, measuring 3.6 cm in diameter. There is no hydronephrosis. There is no evidence of urinary tract calculus.  The stomach and small bowel appear normal. The appendix is not visualized. Stool is present throughout the colon. There is no colonic wall thickening or surrounding inflammation.  There is diffuse aortic atherosclerosis with fusiform dilatation of the distal aorta to 3.2 cm. There is a small left inguinal hernia into which the sigmoid colon protrudes. There is no evidence of bowel incarceration or obstruction. The urinary bladder appears normal. The prostate gland is only mildly enlarged.  The patient is status post bilateral total hip arthroplasty and lower lumbar fusion.  No acute osseous findings are evident.  IMPRESSION: 1. No acute findings or explanation for left flank pain demonstrated. 2. Probable bilateral renal cysts. 3. Grossly stable atherosclerosis and aortic dilatation. 4. Postsurgical changes in the lumbar spine and both hips. 5. Small left inguinal hernia without evidence of bowel incarceration or obstruction.   Electronically Signed   By: Camie Patience M.D.   On: 04/19/2013 18:17      MDM   68 y.o. Male wih flank pain that appears by history and exam to be musculoskeletal. CT done due to coumadin use. No acute intraabdomnal or retroperitoneal findings to explain pain. CT results including aaa discussed with patient and need for follow up.  Plan pain medicine and muscle relaxant and close follow up.  Patient and wife voice understanding.    Shaune Pollack, MD 04/19/13 [phone]

## 2013-04-19 NOTE — Discharge Instructions (Signed)
You have an abdominal aortic aneurysm found on your ct. If you have new abdominal or back pain different from today,you should be reevaluated.  Please follow up with a surgeon as referred.   Back Pain, Adult Low back pain is very common. About 1 in 5 people have back pain.The cause of low back pain is rarely dangerous. The pain often gets better over time.About half of people with a sudden onset of back pain feel better in just 2 weeks. About 8 in 10 people feel better by 6 weeks.  CAUSES Some common causes of back pain include:  Strain of the muscles or ligaments supporting the spine.  Wear and tear (degeneration) of the spinal discs.  Arthritis.  Direct injury to the back. DIAGNOSIS Most of the time, the direct cause of low back pain is not known.However, back pain can be treated effectively even when the exact cause of the pain is unknown.Answering your caregiver's questions about your overall health and symptoms is one of the most accurate ways to make sure the cause of your pain is not dangerous. If your caregiver needs more information, he or she may order lab work or imaging tests (X-rays or MRIs).However, even if imaging tests show changes in your back, this usually does not require surgery. HOME CARE INSTRUCTIONS For many people, back pain returns.Since low back pain is rarely dangerous, it is often a condition that people can learn to [facility] their own.   Remain active. It is stressful on the back to sit or stand in one place. Do not sit, drive, or stand in one place for more than 30 minutes at a time. Take short walks on level surfaces as soon as pain allows.Try to increase the length of time you walk each day.  Do not stay in bed.Resting more than 1 or 2 days can delay your recovery.  Do not avoid exercise or work.Your body is made to move.It is not dangerous to be active, even though your back may hurt.Your back will likely heal faster if you return to being active  before your pain is gone.  Pay attention to your body when you bend and lift. Many people have less discomfortwhen lifting if they bend their knees, keep the load close to their bodies,and avoid twisting. Often, the most comfortable positions are those that put less stress on your recovering back.  Find a comfortable position to sleep. Use a firm mattress and lie on your side with your knees slightly bent. If you lie on your back, put a pillow under your knees.  Only take over-the-counter or prescription medicines as directed by your caregiver. Over-the-counter medicines to reduce pain and inflammation are often the most helpful.Your caregiver may prescribe muscle relaxant drugs.These medicines help dull your pain so you can more quickly return to your normal activities and healthy exercise.  Put ice on the injured area.  Put ice in a plastic bag.  Place a towel between your skin and the bag.  Leave the ice on for 15-20 minutes, 03-04 times a day for the first 2 to 3 days. After that, ice and heat may be alternated to reduce pain and spasms.  Ask your caregiver about trying back exercises and gentle massage. This may be of some benefit.  Avoid feeling anxious or stressed.Stress increases muscle tension and can worsen back pain.It is important to recognize when you are anxious or stressed and learn ways to manage it.Exercise is a great option. SEEK MEDICAL CARE IF:  You have pain that is not relieved with rest or medicine.  You have pain that does not improve in 1 week.  You have new symptoms.  You are generally not feeling well. SEEK IMMEDIATE MEDICAL CARE IF:   You have pain that radiates from your back into your legs.  You develop new bowel or bladder control problems.  You have unusual weakness or numbness in your arms or legs.  You develop nausea or vomiting.  You develop abdominal pain.  You feel faint. Document Released: 02/23/2005 Document Revised: 08/25/2011  Document Reviewed: 07/14/2010 [facility] Patient Information 2014 [place], [place]. Abdominal Aortic Aneurysm An aneurysm is a weakened or damaged part of an artery wall that bulges from the normal force of blood pumping through the body. An abdominal aortic aneurysm is an aneurysm that occurs in the lower part of the aorta, the main artery of the body.  The major concern with an abdominal aortic aneurysm is that it can enlarge and burst (rupture) or blood can flow between the layers of the wall of the aorta through a tear (aorticdissection). Both of these conditions can cause bleeding inside the body and can be life threatening unless diagnosed and treated promptly. CAUSES  The exact cause of an abdominal aortic aneurysm is unknown. Some contributing factors are:   A hardening of the arteries caused by the buildup of fat and other substances in the lining of a blood vessel (arteriosclerosis).  Inflammation of the walls of an artery (arteritis).   Connective tissue diseases, such as Marfan syndrome.   Abdominal trauma.   An infection, such as syphilis or staphylococcus, in the wall of the aorta (infectious aortitis) caused by bacteria. RISK FACTORS  Risk factors that contribute to an abdominal aortic aneurysm may include:  Age older than 48 years.   High blood pressure (hypertension).  Male gender.  Ethnicity (white race).  Obesity.  Family history of aneurysm (first degree relatives only).  Tobacco use. PREVENTION  The following healthy lifestyle habits may help decrease your risk of abdominal aortic aneurysm:  Quitting smoking. Smoking can raise your blood pressure and cause arteriosclerosis.  Limiting or avoiding alcohol.  Keeping your blood pressure, blood sugar level, and cholesterol levels within normal limits.  Decreasing your salt intake. In somepeople, too much salt can raise blood pressure and increase your risk of abdominal aortic aneurysm.  Eating a diet low  in saturated fats and cholesterol.  Increasing your fiber intake by including whole grains, vegetables, and fruits in your diet. Eating these foods may help lower blood pressure.  Maintaining a healthy weight.  Staying physically active and exercising regularly. SYMPTOMS  The symptoms of abdominal aortic aneurysm may vary depending on the size and rate of growth of the aneurysm.Most grow slowly and do not have any symptoms. When symptoms do occur, they may include:  Pain (abdomen, side, lower back, or groin). The pain may vary in intensity. A sudden onset of severe pain may indicate that the aneurysm has ruptured.  Feeling full after eating only small amounts of food.  Nausea or vomiting or both.  Feeling a pulsating lump in the abdomen.  Feeling faint or passing out. DIAGNOSIS  Since most unruptured abdominal aortic aneurysms have no symptoms, they are often discovered during diagnostic exams for other conditions. An aneurysm may be found during the following procedures:  Ultrasonography (A one-time screening for abdominal aortic aneurysm by ultrasonography is also recommended for all men aged 2-75 years who have ever smoked).  X-Jayel Inks  exams.  A computed tomography (CT).  Magnetic resonance imaging (MRI).  Angiography or arteriography. TREATMENT  Treatment of an abdominal aortic aneurysm depends on the size of your aneurysm, your age, and risk factors for rupture. Medication to control blood pressure and pain may be used to manage aneurysms smaller than 6 cm. Regular monitoring for enlargement may be recommended by your caregiver if:  The aneurysm is 3 4 cm in size (an annual ultrasonography may be recommended).  The aneurysm is 4 4.5 cm in size (an ultrasonography every 6 months may be recommended).  The aneurysm is larger than 4.5 cm in size (your caregiver may ask that you be examined by a vascular surgeon). If your aneurysm is larger than 6 cm, surgical repair may be  recommended. There are two main methods for repair of an aneurysm:   Endovascular repair (a minimally invasive surgery). This is done most often.  Open repair. This method is used if an endovascular repair is not possible. Document Released: 12/03/2004 Document Revised: 06/20/2012 Document Reviewed: 03/25/2012 [facility] Patient Information 2014 [place], [place].

## 2013-04-19 NOTE — Telephone Encounter (Signed)
Spoke with the pt's wife and she stated that the pt started having (L) side pain last night about (6:00pm).  The pt found some pain med that he had at the house and took if,but she was not sure what the med was.  She stated that they are still at the ER and he has been evaluated by the nurse.  She wanted to let Dr. Linna Darner know what was going on.//AB/CMA

## 2013-04-19 NOTE — ED Notes (Signed)
Pt c/o lt flank pain since last night. Denies NVD.  Denies hx of kidney stones. C/o frequency.  Denies dysuria.

## 2013-04-19 NOTE — Telephone Encounter (Signed)
Patient's wife, Santiago Glad, called and stated that she was taking patient to [facility] ER for patient to be evaluated for severe pain. She is requesting a call back. Please advise. JG//CMA

## 2013-04-24 ENCOUNTER — Emergency Department (HOSPITAL_COMMUNITY)
Admission: EM | Admit: 2013-04-24 | Discharge: 2013-04-24 | Disposition: A | Discharge status: Home / Self Care | Payer: Medicare Other | Attending: Emergency Medicine | Admitting: Emergency Medicine

## 2013-04-24 ENCOUNTER — Encounter (HOSPITAL_COMMUNITY): Payer: Self-pay | Admitting: Emergency Medicine

## 2013-04-24 ENCOUNTER — Emergency Department (HOSPITAL_COMMUNITY): Payer: Medicare Other

## 2013-04-24 DIAGNOSIS — Z7901 Long term (current) use of anticoagulants: Secondary | ICD-10-CM | POA: Diagnosis not present

## 2013-04-24 DIAGNOSIS — Z86718 Personal history of other venous thrombosis and embolism: Secondary | ICD-10-CM | POA: Insufficient documentation

## 2013-04-24 DIAGNOSIS — R109 Unspecified abdominal pain: Secondary | ICD-10-CM | POA: Diagnosis not present

## 2013-04-24 DIAGNOSIS — M545 Low back pain, unspecified: Secondary | ICD-10-CM | POA: Insufficient documentation

## 2013-04-24 DIAGNOSIS — IMO0002 Reserved for concepts with insufficient information to code with codable children: Secondary | ICD-10-CM | POA: Insufficient documentation

## 2013-04-24 DIAGNOSIS — I10 Essential (primary) hypertension: Secondary | ICD-10-CM | POA: Insufficient documentation

## 2013-04-24 DIAGNOSIS — I4891 Unspecified atrial fibrillation: Secondary | ICD-10-CM | POA: Diagnosis not present

## 2013-04-24 DIAGNOSIS — J4489 Other specified chronic obstructive pulmonary disease: Secondary | ICD-10-CM | POA: Insufficient documentation

## 2013-04-24 DIAGNOSIS — M5137 Other intervertebral disc degeneration, lumbosacral region: Secondary | ICD-10-CM | POA: Diagnosis not present

## 2013-04-24 DIAGNOSIS — Z87891 Personal history of nicotine dependence: Secondary | ICD-10-CM | POA: Diagnosis not present

## 2013-04-24 DIAGNOSIS — K4090 Unilateral inguinal hernia, without obstruction or gangrene, not specified as recurrent: Secondary | ICD-10-CM | POA: Diagnosis not present

## 2013-04-24 DIAGNOSIS — J449 Chronic obstructive pulmonary disease, unspecified: Secondary | ICD-10-CM | POA: Insufficient documentation

## 2013-04-24 DIAGNOSIS — Z880 Allergy status to penicillin: Secondary | ICD-10-CM | POA: Diagnosis not present

## 2013-04-24 DIAGNOSIS — Z862 Personal history of diseases of the blood and blood-forming organs and certain disorders involving the immune mechanism: Secondary | ICD-10-CM | POA: Insufficient documentation

## 2013-04-24 DIAGNOSIS — K219 Gastro-esophageal reflux disease without esophagitis: Secondary | ICD-10-CM | POA: Insufficient documentation

## 2013-04-24 DIAGNOSIS — Z791 Long term (current) use of non-steroidal anti-inflammatories (NSAID): Secondary | ICD-10-CM | POA: Diagnosis not present

## 2013-04-24 DIAGNOSIS — M48061 Spinal stenosis, lumbar region without neurogenic claudication: Secondary | ICD-10-CM | POA: Diagnosis not present

## 2013-04-24 DIAGNOSIS — Z8669 Personal history of other diseases of the nervous system and sense organs: Secondary | ICD-10-CM | POA: Diagnosis not present

## 2013-04-24 DIAGNOSIS — Z79899 Other long term (current) drug therapy: Secondary | ICD-10-CM | POA: Diagnosis not present

## 2013-04-24 DIAGNOSIS — Z87448 Personal history of other diseases of urinary system: Secondary | ICD-10-CM | POA: Insufficient documentation

## 2013-04-24 DIAGNOSIS — M47817 Spondylosis without myelopathy or radiculopathy, lumbosacral region: Secondary | ICD-10-CM | POA: Diagnosis not present

## 2013-04-24 DIAGNOSIS — IMO0001 Reserved for inherently not codable concepts without codable children: Secondary | ICD-10-CM | POA: Diagnosis not present

## 2013-04-24 MED ORDER — HYDROMORPHONE HCL 2 MG PO TABS: 2.0000 mg | ORAL_TABLET | ORAL | Status: DC | PRN

## 2013-04-24 MED ORDER — HYDROMORPHONE HCL PF 1 MG/ML IJ SOLN
1.0000 mg | Freq: Once | INTRAMUSCULAR | Status: AC
Start: 2013-04-24 — End: 2013-04-24
  Administered 2013-04-24: 1 mg via INTRAMUSCULAR
  Filled 2013-04-24: qty 1

## 2013-04-24 NOTE — ED Notes (Signed)
Patient reports to ED for stabbing left flank pain around back surgery scars, states that pain is "deep." Pt denies dysuria.

## 2013-04-24 NOTE — Discharge Instructions (Signed)
Your MRI was read as showing degenerative changes, and summarized below: IMPRESSION: Postoperative changes as described, similar appearance to prior CT myelogram from 06/20/2010. 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  Examination of the lower thoracic region and L1-2 disc space reveal no significant adjacent segment compressive pathology.  Your recent ct scan was read as showing no acute process.  Note was made on that ct of a left inguinal hernia.  For hernia, you may follow up with general surgeon in the next few weeks.  If area becomes acutely swollen and/or painful, return to ER for evaluation.  Follow up with your back specialist as planned next week.  Have them review today's MRI.  If you other pain medication is not helping, you may try dilaudid as need for pain - may make drowsy, no driving for the next 4 hours or if/when taking dilaudid.  Return to ER if worse, new symptoms, fevers, intractable pain, other concern.     Flank Pain Flank pain refers to pain that is located on the side of the body between the upper abdomen and the back. The pain may occur over a short period of time (acute) or may be long-term or reoccurring (chronic). It may be mild or severe. Flank pain can be caused by many things. CAUSES  Some of the more common causes of flank pain include:  Muscle strains.   Muscle spasms.   A disease of your spine (vertebral disk disease).   A lung infection (pneumonia).   Fluid around your lungs (pulmonary edema).   A kidney infection.   Kidney stones.   A very painful skin rash caused by the chickenpox virus (shingles).   Gallbladder disease.  [place] care will depend on the cause of your pain. In general,  Rest as directed by your caregiver.  Drink enough fluids to keep your urine clear or pale yellow.  Only take over-the-counter or prescription  medicines as directed by your caregiver. Some medicines may help relieve the pain.  Tell your caregiver about any changes in your pain.  Follow up with your caregiver as directed. SEEK IMMEDIATE MEDICAL CARE IF:   Your pain is not controlled with medicine.   You have new or worsening symptoms.  Your pain increases.   You have abdominal pain.   You have shortness of breath.   You have persistent nausea or vomiting.   You have swelling in your abdomen.   You feel faint or pass out.   You have blood in your urine.  You have a fever or persistent symptoms for more than 2 3 days.  You have a fever and your symptoms suddenly get worse. MAKE SURE YOU:   Understand these instructions.  Will watch your condition.  Will get help right away if you are not doing well or get worse. Document Released: 04/16/2005 Document Revised: 11/18/2011 Document Reviewed: 10/08/2011 [facility] Patient Information 2014 [place].     Back Pain, Adult Low back pain is very common. About 1 in 5 people have back pain.The cause of low back pain is rarely dangerous. The pain often gets better over time.About half of people with a sudden onset of back pain feel better in just 2 weeks. About 8 in 10 people feel better by 6 weeks.  CAUSES Some common causes of back pain include:  Strain of the muscles or ligaments supporting the spine.  Wear and  tear (degeneration) of the spinal discs.  Arthritis.  Direct injury to the back. DIAGNOSIS Most of the time, the direct cause of low back pain is not known.However, back pain can be treated effectively even when the exact cause of the pain is unknown.Answering your caregiver's questions about your overall health and symptoms is one of the most accurate ways to make sure the cause of your pain is not dangerous. If your caregiver needs more information, he or she may order lab work or imaging tests (X-rays or MRIs).However, even if imaging  tests show changes in your back, this usually does not require surgery. HOME CARE INSTRUCTIONS For many people, back pain returns.Since low back pain is rarely dangerous, it is often a condition that people can learn to [facility] their own.   Remain active. It is stressful on the back to sit or stand in one place. Do not sit, drive, or stand in one place for more than 30 minutes at a time. Take short walks on level surfaces as soon as pain allows.Try to increase the length of time you walk each day.  Do not stay in bed.Resting more than 1 or 2 days can delay your recovery.  Do not avoid exercise or work.Your body is made to move.It is not dangerous to be active, even though your back may hurt.Your back will likely heal faster if you return to being active before your pain is gone.  Pay attention to your body when you bend and lift. Many people have less discomfortwhen lifting if they bend their knees, keep the load close to their bodies,and avoid twisting. Often, the most comfortable positions are those that put less stress on your recovering back.  Find a comfortable position to sleep. Use a firm mattress and lie on your side with your knees slightly bent. If you lie on your back, put a pillow under your knees.  Only take over-the-counter or prescription medicines as directed by your caregiver. Over-the-counter medicines to reduce pain and inflammation are often the most helpful.Your caregiver may prescribe muscle relaxant drugs.These medicines help dull your pain so you can more quickly return to your normal activities and healthy exercise.  Put ice on the injured area.  Put ice in a plastic bag.  Place a towel between your skin and the bag.  Leave the ice on for 15-20 minutes, 03-04 times a day for the first 2 to 3 days. After that, ice and heat may be alternated to reduce pain and spasms.  Ask your caregiver about trying back exercises and gentle massage. This may be of some  benefit.  Avoid feeling anxious or stressed.Stress increases muscle tension and can worsen back pain.It is important to recognize when you are anxious or stressed and learn ways to manage it.Exercise is a great option. SEEK MEDICAL CARE IF:  You have pain that is not relieved with rest or medicine.  You have pain that does not improve in 1 week.  You have new symptoms.  You are generally not feeling well. SEEK IMMEDIATE MEDICAL CARE IF:   You have pain that radiates from your back into your legs.  You develop new bowel or bladder control problems.  You have unusual weakness or numbness in your arms or legs.  You develop nausea or vomiting.  You develop abdominal pain.  You feel faint. Document Released: 02/23/2005 Document Revised: 08/25/2011 Document Reviewed: 07/14/2010 [facility] Patient Information 2014 [place], [place].    Degenerative Disk Disease Degenerative disk disease is a  condition caused by the changes that occur in the cushions of the backbone (spinal disks) as you grow older. Spinal disks are soft and compressible disks located between the bones of the spine (vertebrae). They act like shock absorbers. Degenerative disk disease can affect the whole spine. However, the neck and lower back are most commonly affected. Many changes can occur in the spinal disks with aging, such as:  The spinal disks may dry and shrink.  Small tears may occur in the tough, outer covering of the disk (annulus).  The disk space may become smaller due to loss of water.  Abnormal growths in the bone (spurs) may occur. This can put pressure on the nerve roots exiting the spinal canal, causing pain.  The spinal canal may become narrowed. CAUSES  Degenerative disk disease is a condition caused by the changes that occur in the spinal disks with aging. The exact cause is not known, but there is a genetic basis for many patients. Degenerative changes can occur due to loss of fluid in the  disk. This makes the disk thinner and reduces the space between the backbones. Small cracks can develop in the outer layer of the disk. This can lead to the breakdown of the disk. You are more likely to get degenerative disk disease if you are overweight. Smoking cigarettes and doing heavy work such as weightlifting can also increase your risk of this condition. Degenerative changes can start after a sudden injury. Growth of bone spurs can compress the nerve roots and cause pain.  SYMPTOMS  The symptoms vary from person to person. Some people may have no pain, while others have severe pain. The pain may be so severe that it can limit your activities. The location of the pain depends on the part of your backbone that is affected. You will have neck or arm pain if a disk in the neck area is affected. You will have pain in your back, buttocks, or legs if a disk in the lower back is affected. The pain becomes worse while bending, reaching up, or with twisting movements. The pain may start gradually and then get worse as time passes. It may also start after a major or minor injury. You may feel numbness or tingling in the arms or legs.  DIAGNOSIS  Your caregiver will ask you about your symptoms and about activities or habits that may cause the pain. He or she may also ask about any injuries, diseases, or treatments you have had earlier. Your caregiver will examine you to check for the range of movement that is possible in the affected area, to check for strength in your extremities, and to check for sensation in the areas of the arms and legs supplied by different nerve roots. An X-ray of the spine may be taken. Your caregiver may suggest other imaging tests, such as magnetic resonance imaging (MRI), if needed.  TREATMENT  Treatment includes rest, modifying your activities, and applying ice and heat. Your caregiver may prescribe medicines to reduce your pain and may ask you to do some exercises to strengthen your  back. In some cases, you may need surgery. You and your caregiver will decide on the treatment that is best for you. HOME CARE INSTRUCTIONS   Follow proper lifting and walking techniques as advised by your caregiver.  Maintain good posture.  Exercise regularly as advised.  Perform relaxation exercises.  Change your sitting, standing, and sleeping habits as advised. Change positions frequently.  Lose weight as advised.  Stop smoking if you smoke.  Wear supportive footwear. SEEK MEDICAL CARE IF:  Your pain does not go away within 1 to 4 weeks. SEEK IMMEDIATE MEDICAL CARE IF:   Your pain is severe.  You notice weakness in your arms, hands, or legs.  You begin to lose control of your bladder or bowel movements. MAKE SURE YOU:   Understand these instructions.  Will watch your condition.  Will get help right away if you are not doing well or get worse. Document Released: 12/21/2006 Document Revised: 05/18/2011 Document Reviewed: 12/21/2006 [facility] Patient Information 2014 [place].    Hernia A hernia occurs when an internal organ pushes out through a weak spot in the abdominal wall. Hernias most commonly occur in the groin and around the navel. Hernias often can be pushed back into place (reduced). Most hernias tend to get worse over time. Some abdominal hernias can get stuck in the opening (irreducible or incarcerated hernia) and cannot be reduced. An irreducible abdominal hernia which is tightly squeezed into the opening is at risk for impaired blood supply (strangulated hernia). A strangulated hernia is a medical emergency. Because of the risk for an irreducible or strangulated hernia, surgery may be recommended to repair a hernia. CAUSES   Heavy lifting.  Prolonged coughing.  Straining to have a bowel movement.  A cut (incision) made during an abdominal surgery. HOME CARE INSTRUCTIONS   Bed rest is not required. You may continue your normal  activities.  Avoid lifting more than 10 pounds (4.5 kg) or straining.  Cough gently. If you are a smoker it is best to stop. Even the best hernia repair can break down with the continual strain of coughing. Even if you do not have your hernia repaired, a cough will continue to aggravate the problem.  Do not wear anything tight over your hernia. Do not try to keep it in with an outside bandage or truss. These can damage abdominal contents if they are trapped within the hernia sac.  Eat a normal diet.  Avoid constipation. Straining over long periods of time will increase hernia size and encourage breakdown of repairs. If you cannot do this with diet alone, stool softeners may be used. SEEK IMMEDIATE MEDICAL CARE IF:   You have a fever.  You develop increasing abdominal pain.  You feel nauseous or vomit.  Your hernia is stuck outside the abdomen, looks discolored, feels hard, or is tender.  You have any changes in your bowel habits or in the hernia that are unusual for you.  You have increased pain or swelling around the hernia.  You cannot push the hernia back in place by applying gentle pressure while lying down. MAKE SURE YOU:   Understand these instructions.  Will watch your condition.  Will get help right away if you are not doing well or get worse. Document Released: 02/23/2005 Document Revised: 05/18/2011 Document Reviewed: 10/13/2007 [facility] Patient Information 2014 [place].

## 2013-04-24 NOTE — ED Provider Notes (Signed)
CSN: [id number]     Arrival date & time 04/24/13  4259 History   First MD Initiated Contact with Patient 04/24/13 0800     Chief Complaint  Patient presents with  . Flank Pain     (Consider location/radiation/quality/duration/timing/severity/associated sxs/prior Treatment) Patient is a 68 y.o. male presenting with flank pain. The history is provided by the patient and the spouse.  Flank Pain Pertinent negatives include no chest pain, no abdominal pain, no headaches and no shortness of breath.  pt c/o left low back and left flank pain for the past week. Constant. Dull. Worse w certain position changes, turning torso.  Radiates towards left buttock/hip. No pain down leg. No associated numbness/weakness. No perineal numbness/saddle area anesthesia. States hx ddd and spinal stenosis, multiple prior surgeries for same/approach left flank - last surgery 4-5 yrs ago. Pt denies any recent trauma or fall. No fever or chills. No rash/shingles to area of pain. No anterior/abd pain. No dysuria or hematuria. Pt states given rx percocet during prior visit which hasnt helped pain. States when lying supine, no pain, when up/twisting/bending severe pain.     Past Medical History  Diagnosis Date  . BPH (benign prostatic hypertrophy)   . Spinal stenosis     congential  . Hypertension   . Asthma   . History of blood clots     in legs and lungs following back surgery in 2010  . Allergic rhinitis   . Atrial fibrillation   . 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 Cataract     REMOVED  . Clotting disorder     BLOOD CLOTS IN LEGS/LUNGS -2010  . COPD (chronic obstructive pulmonary disease)   . GERD (gastroesophageal reflux disease)   . Diverticulosis    Past Surgical History  Procedure Laterality Date  . Vasectomy    . Lumbar fusion      x2  . Total hip arthroplasty      bilat  . Cataracts    . Colonoscopy with polypectomy  2004   Family History  Problem Relation Age of Onset  . Atrial  fibrillation Mother   . COPD Mother   . Heart disease Father   . Colon cancer Father   . Heart attack Brother 79  . Alcohol abuse Brother 14    died post liver transplant   History  Substance Use Topics  . Smoking status: Former Smoker -- 2.00 packs/day for 40 years    Types: Cigarettes    Quit date: 03/09/2000  . Smokeless tobacco: Never Used     Comment: smoked 1964-2002, up to 3-4 ppd  . Alcohol Use: Yes     Comment:  rarely    Review of Systems  Constitutional: Negative for fever and chills.  HENT: Negative for sore throat.   Eyes: Negative for redness.  Respiratory: Negative for cough and shortness of breath.   Cardiovascular: Negative for chest pain.  Gastrointestinal: Negative for nausea, vomiting, abdominal pain, diarrhea, constipation and abdominal distention.  Genitourinary: Positive for flank pain. Negative for dysuria and hematuria.  Musculoskeletal: Positive for back pain. Negative for neck pain.  Skin: Negative for rash.  Neurological: Negative for weakness, numbness and headaches.  Hematological: Does not bruise/bleed easily.  Psychiatric/Behavioral: Negative for confusion.      Allergies  Penicillins  Home Medications   Current Outpatient Rx  Name  Route  Sig  Dispense  Refill  . albuterol (PROAIR HFA) 108 (90 BASE) MCG/ACT inhaler   Inhalation   Inhale 2 puffs  into the lungs every 6 (six) hours as needed for wheezing or shortness of breath.         . budesonide-formoterol (SYMBICORT) 160-4.5 MCG/ACT inhaler   Inhalation   Inhale 2 puffs into the lungs 2 (two) times daily.   1 Inhaler   3   . cyclobenzaprine (FLEXERIL) 10 MG tablet   Oral   Take 1 tablet (10 mg total) by mouth 2 (two) times daily as needed for muscle spasms.   20 tablet   0   . diclofenac (VOLTAREN) 75 MG EC tablet   Oral   Take 1 tablet by mouth 2 (two) times daily.         [name] diltiazem (DILACOR XR) 240 MG 24 hr capsule   Oral   Take 240 mg by mouth at bedtime.          . flecainide (TAMBOCOR) 150 MG tablet   Oral   Take 1 tablet (150 mg total) by mouth 2 (two) times daily.   180 tablet   3   . LORazepam (ATIVAN) 0.5 MG tablet   Oral   Take 0.5 mg by mouth at bedtime as needed for anxiety or sleep.         . metoprolol succinate (TOPROL-XL) 25 MG 24 hr tablet   Oral   Take 25 mg by mouth at bedtime.         [name] omeprazole (PRILOSEC) 20 MG capsule   Oral   Take 20 mg by mouth at bedtime.         [name] oxyCODONE-acetaminophen (PERCOCET/ROXICET) 5-325 MG per tablet   Oral   Take 1 tablet by mouth every 4 (four) hours as needed for severe pain.   15 tablet   0   . PRESCRIPTION MEDICATION   Oral   Take 2 tablets by mouth once.         . sertraline (ZOLOFT) 50 MG tablet   Oral   Take 50 mg by mouth at bedtime.         [name] warfarin (COUMADIN) 3 MG tablet      Take 2 tabs (6mg ) on Tues, Thurs, Sun. And take 1&1/2 tabs (4.5mg ) all other days.          BP 170/85  Pulse 86  Temp(Src) 97.4 F (36.3 C) (Oral)  Resp 28  SpO2 97% Physical Exam  Nursing note and vitals reviewed. Constitutional: He is oriented to person, place, and time. He appears well-developed and well-nourished. No distress.  HENT:  Head: Atraumatic.  Eyes: Conjunctivae are normal.  Neck: Neck supple. No tracheal deviation present.  Cardiovascular: Normal rate, regular rhythm, normal heart sounds and intact distal pulses.   Pulmonary/Chest: Effort normal and breath sounds normal. No accessory muscle usage. No respiratory distress.  Abdominal: Soft. Bowel sounds are normal. He exhibits no distension and no mass. There is no tenderness. There is no rebound and no guarding.  No incarcerated hernia.   Genitourinary:  No cva tenderness  Musculoskeletal: Normal range of motion.  TLS spine, non tender, aligned, no step off. Left lower back muscular tenderness.   Neurological: He is alert and oriented to person, place, and time. He displays normal reflexes.  Motor  intact bil, steady gait.   Skin: Skin is warm and dry. No rash noted. He is not diaphoretic.  No shingles/rash to area of pain  Psychiatric: He has a normal mood and affect.    ED Course  Procedures (including critical care time)  Results  for orders placed during the hospital encounter of 04/19/13  CBC WITH DIFFERENTIAL      Result Value Ref Range   WBC 13.4 (*) 4.0 - 10.5 K/uL   RBC 3.74 (*) 4.22 - 5.81 MIL/uL   Hemoglobin 12.8 (*) 13.0 - 17.0 g/dL   HCT 38.0 (*) 39.0 - 52.0 %   MCV 101.6 (*) 78.0 - 100.0 fL   MCH 34.2 (*) 26.0 - 34.0 pg   MCHC 33.7  30.0 - 36.0 g/dL   RDW 15.1  11.5 - 15.5 %   Platelets 473 (*) 150 - 400 K/uL   Neutrophils Relative % 69  43 - 77 %   Neutro Abs 9.3 (*) 1.7 - 7.7 K/uL   Lymphocytes Relative 19  12 - 46 %   Lymphs Abs 2.6  0.7 - 4.0 K/uL   Monocytes Relative 8  3 - 12 %   Monocytes Absolute 1.0  0.1 - 1.0 K/uL   Eosinophils Relative 4  0 - 5 %   Eosinophils Absolute 0.5  0.0 - 0.7 K/uL   Basophils Relative 0  0 - 1 %   Basophils Absolute 0.1  0.0 - 0.1 K/uL  COMPREHENSIVE METABOLIC PANEL      Result Value Ref Range   Sodium 136 (*) 137 - 147 mEq/L   Potassium 4.5  3.7 - 5.3 mEq/L   Chloride 99  96 - 112 mEq/L   CO2 23  19 - 32 mEq/L   Glucose, Bld 131 (*) 70 - 99 mg/dL   BUN 17  6 - 23 mg/dL   Creatinine, Ser 1.08  0.50 - 1.35 mg/dL   Calcium 9.0  8.4 - 10.5 mg/dL   Total Protein 7.8  6.0 - 8.3 g/dL   Albumin 3.7  3.5 - 5.2 g/dL   AST 22  0 - 37 U/L   ALT 24  0 - 53 U/L   Alkaline Phosphatase 91  39 - 117 U/L   Total Bilirubin 0.3  0.3 - 1.2 mg/dL   GFR calc non Af Amer 68 (*) >90 mL/min   GFR calc Af Amer 79 (*) >90 mL/min  LIPASE, BLOOD      Result Value Ref Range   Lipase 32  11 - 59 U/L  URINALYSIS, ROUTINE W REFLEX MICROSCOPIC      Result Value Ref Range   Color, Urine YELLOW  YELLOW   APPearance CLEAR  CLEAR   Specific Gravity, Urine 1.030  1.005 - 1.030   pH 6.0  5.0 - 8.0   Glucose, UA NEGATIVE  NEGATIVE mg/dL   Hgb  urine dipstick NEGATIVE  NEGATIVE   Bilirubin Urine NEGATIVE  NEGATIVE   Ketones, ur NEGATIVE  NEGATIVE mg/dL   Protein, ur NEGATIVE  NEGATIVE mg/dL   Urobilinogen, UA 1.0  0.0 - 1.0 mg/dL   Nitrite NEGATIVE  NEGATIVE   Leukocytes, UA TRACE (*) NEGATIVE  PROTIME-INR      Result Value Ref Range   Prothrombin Time 26.2 (*) 11.6 - 15.2 seconds   INR 2.50 (*) 0.00 - 1.49  URINE MICROSCOPIC-ADD ON      Result Value Ref Range   Squamous Epithelial / LPF RARE  RARE   WBC, UA 0-2  <3 WBC/hpf   Mr Lumbar Spine Wo Contrast  04/24/2013   CLINICAL DATA:  Low back pain.  Severe pain radiating to left side.  EXAM: MRI LUMBAR SPINE WITHOUT CONTRAST  TECHNIQUE: Multiplanar, multisequence MR imaging was performed. No intravenous contrast  was administered.  COMPARISON:  CT ABD/PELVIS W CM dated 04/19/2013; CT L SPINE W/CM dated 06/20/2010; DG MYELOGRAM LUMBAR dated 06/20/2010; MR L SPINE W/O dated 03/15/2008  FINDINGS: The patient has undergone previous L2 through L5 fusion with interbody cages at L2-3, L3-4, and L4-5. Pedicle screws have been placed from L2 through L5, and there has been an anterior fusion at the L4-5 level as well. The alignment is anatomic. There is no lumbar or lower thoracic compression deformity. The conus medullaris is within normal limits. Minor renal cystic disease is unchanged.  The lower thoracic region was examined from T8-9 through T12-L1 where no stenosis or neural impingement is evident. There is moderate lower thoracic facet arthropathy without foraminal narrowing. Moderate atheromatous change of the aorta is stable.  At L1-L2, there is a large syndesmophyte in the left extraforaminal region without foraminal narrowing. Mild facet arthropathy without stenosis or L2 nerve root impingement.  At L2-L3, there is severe facet arthrosis on the right. Interbody cage in good position. No neural compression.  At L3-L4, there is severe bilateral facet degeneration with moderate central canal  stenosis. Previous discectomy with fusion. Mild symmetric foraminal narrowing. No clear-cut L3 or L4 nerve root impingement.  At L4-5 there is mild to moderate central canal stenosis with bilateral facet hypertrophy. Short pedicles are also noted. Mild left L5 nerve root impingement in the lateral recess. Foraminal narrowing on the left without clear-cut L4 nerve root impingement.  At L5-S1 there is relatively normal disc height. Bilateral facet arthropathy. No impingement.  IMPRESSION: Postoperative changes as described, similar appearance to prior CT myelogram from 06/20/2010. 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  Examination of the lower thoracic region and L1-2 disc space reveal no significant adjacent segment compressive pathology.   Electronically Signed   By: Rolla Flatten M.D.   On: 04/24/2013 11:37   Ct Abdomen Pelvis W Contrast  04/19/2013   CLINICAL DATA:  Left flank pain.  On Coumadin.  EXAM: CT ABDOMEN AND PELVIS WITH CONTRAST  TECHNIQUE: Multidetector CT imaging of the abdomen and pelvis was performed using the standard protocol following bolus administration of intravenous contrast.  CONTRAST:  77mL OMNIPAQUE IOHEXOL 300 MG/ML SOLN, 147mL OMNIPAQUE IOHEXOL 300 MG/ML SOLN  COMPARISON:  CT L SPINE W/CM dated 06/20/2010; MR L SPINE W/O dated 03/15/2008  FINDINGS: There is linear scarring at the right lung base. The lung bases are otherwise clear. There is no pleural or pericardial effusion.  1.4 cm low-density lesion in the posterior segment of the right hepatic lobe on image 25 is likely a cyst. The liver otherwise appears normal. The gallbladder, spleen, pancreas and adrenal glands appear normal. There are low density renal lesions bilaterally which are likely cysts. The largest is in the interpolar region of the right kidney, measuring 3.6 cm in diameter. There is no hydronephrosis. There is no evidence of urinary tract calculus.   The stomach and small bowel appear normal. The appendix is not visualized. Stool is present throughout the colon. There is no colonic wall thickening or surrounding inflammation.  There is diffuse aortic atherosclerosis with fusiform dilatation of the distal aorta to 3.2 cm. There is a small left inguinal hernia into which the sigmoid colon protrudes. There is no evidence of bowel incarceration or obstruction. The urinary bladder appears normal. The prostate gland is only mildly enlarged.  The patient is status post bilateral total hip arthroplasty and lower lumbar  fusion. No acute osseous findings are evident.  IMPRESSION: 1. No acute findings or explanation for left flank pain demonstrated. 2. Probable bilateral renal cysts. 3. Grossly stable atherosclerosis and aortic dilatation. 4. Postsurgical changes in the lumbar spine and both hips. 5. Small left inguinal hernia without evidence of bowel incarceration or obstruction.   Electronically Signed   By: Camie Patience M.D.   On: 04/19/2013 18:17     MDM   Pt has ride/spouse here. Dilaudid 1 mg im.  Reviewed nursing notes and prior charts for additional history.   Pt/spouse request mri, they state have appt w [facility], Dr Christella Noa, next week.  Mri ordered.   Reviewed recent chart - pt w ct abd/pelvis in past week for same symptoms, neg acute.  Labs done then incl inr unremarkable.   Radiologists states MR w chronic deg changes/ddd, neg acute.  Recheck pt comfortable. abd soft nt.   No new neuro c/o, no numbness/weakness.  Pt appears stable for d/c.  Has close follow up with his back specialist next week.     Mirna Mires, MD 04/24/13 1155

## 2013-04-28 DIAGNOSIS — M169 Osteoarthritis of hip, unspecified: Secondary | ICD-10-CM | POA: Diagnosis not present

## 2013-04-28 DIAGNOSIS — Z96649 Presence of unspecified artificial hip joint: Secondary | ICD-10-CM | POA: Diagnosis not present

## 2013-04-28 DIAGNOSIS — M1610 Unilateral primary osteoarthritis, unspecified hip: Secondary | ICD-10-CM | POA: Diagnosis not present

## 2013-04-30 IMAGING — CR DG CHEST 2V
2 series · 2 of 2 positions shown · non-contrast
Comparison: 01/16/2011

CLINICAL DATA: COPD exacerbation.

CHEST - 2 VIEW

[view not recorded (1 of 2)]
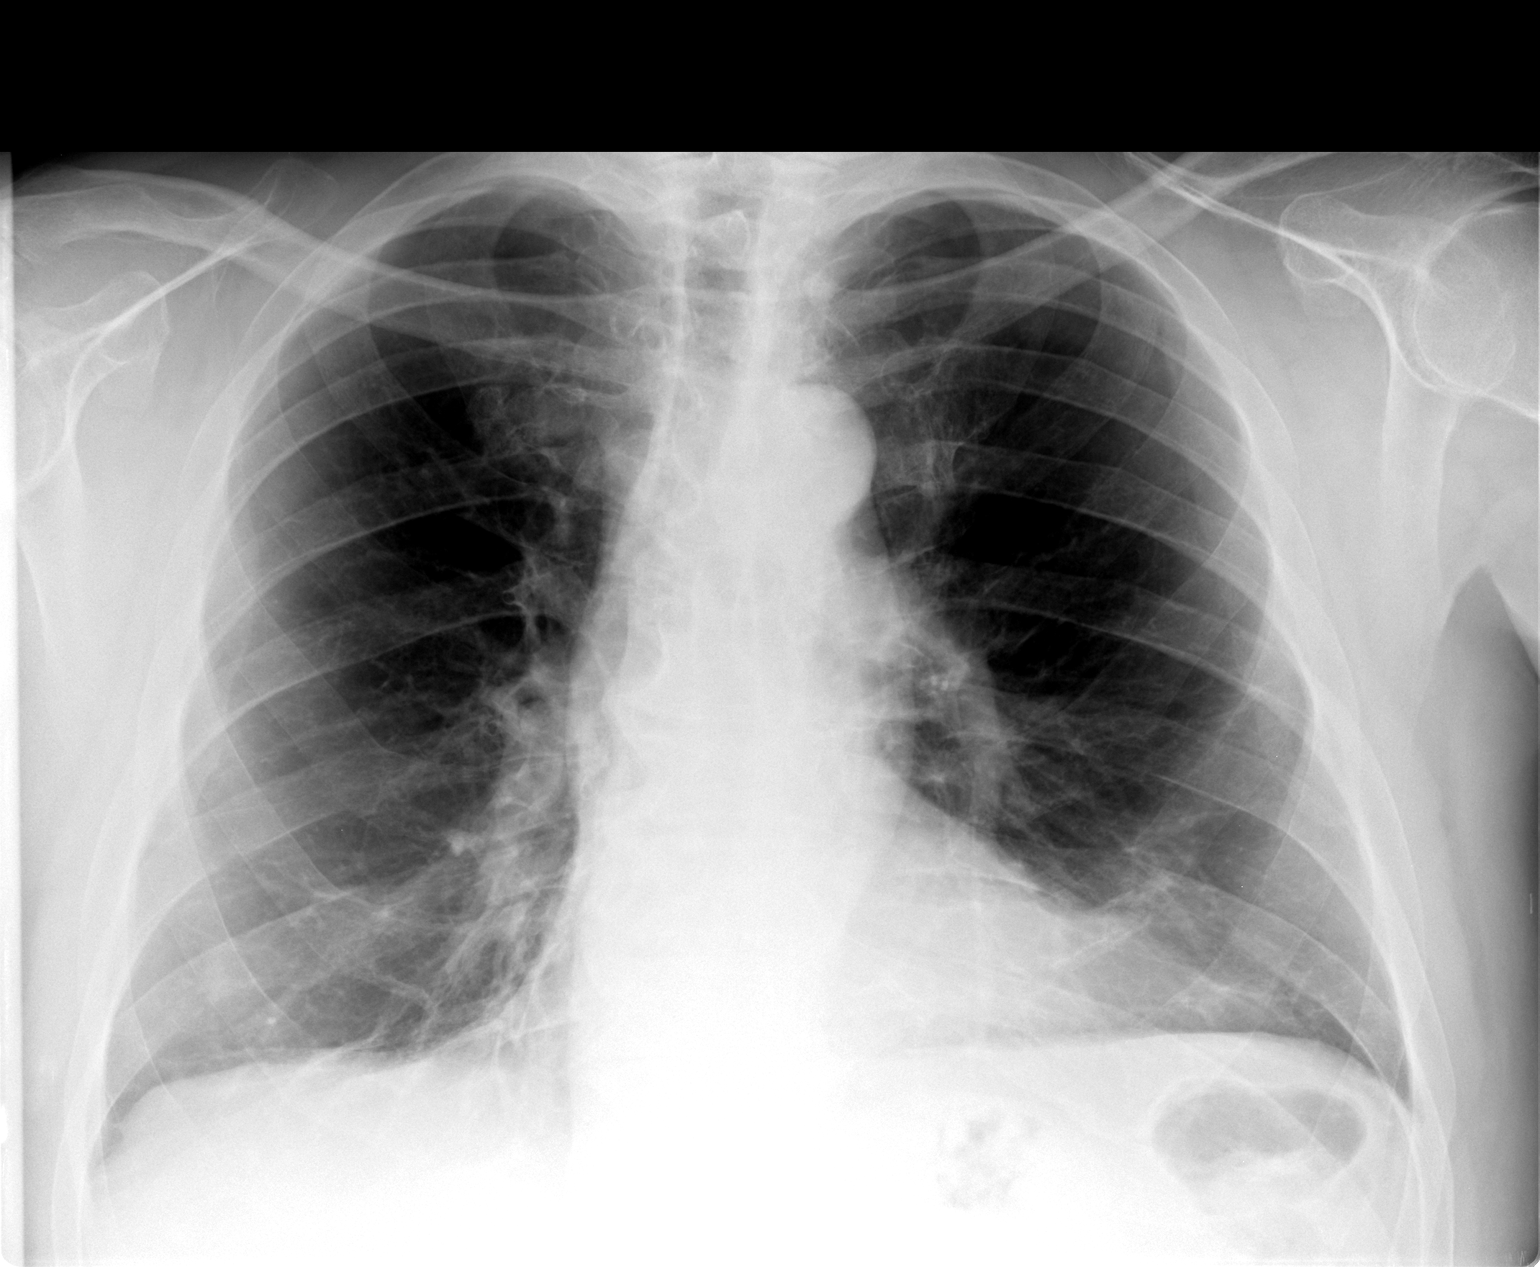

[view not recorded (2 of 2)]
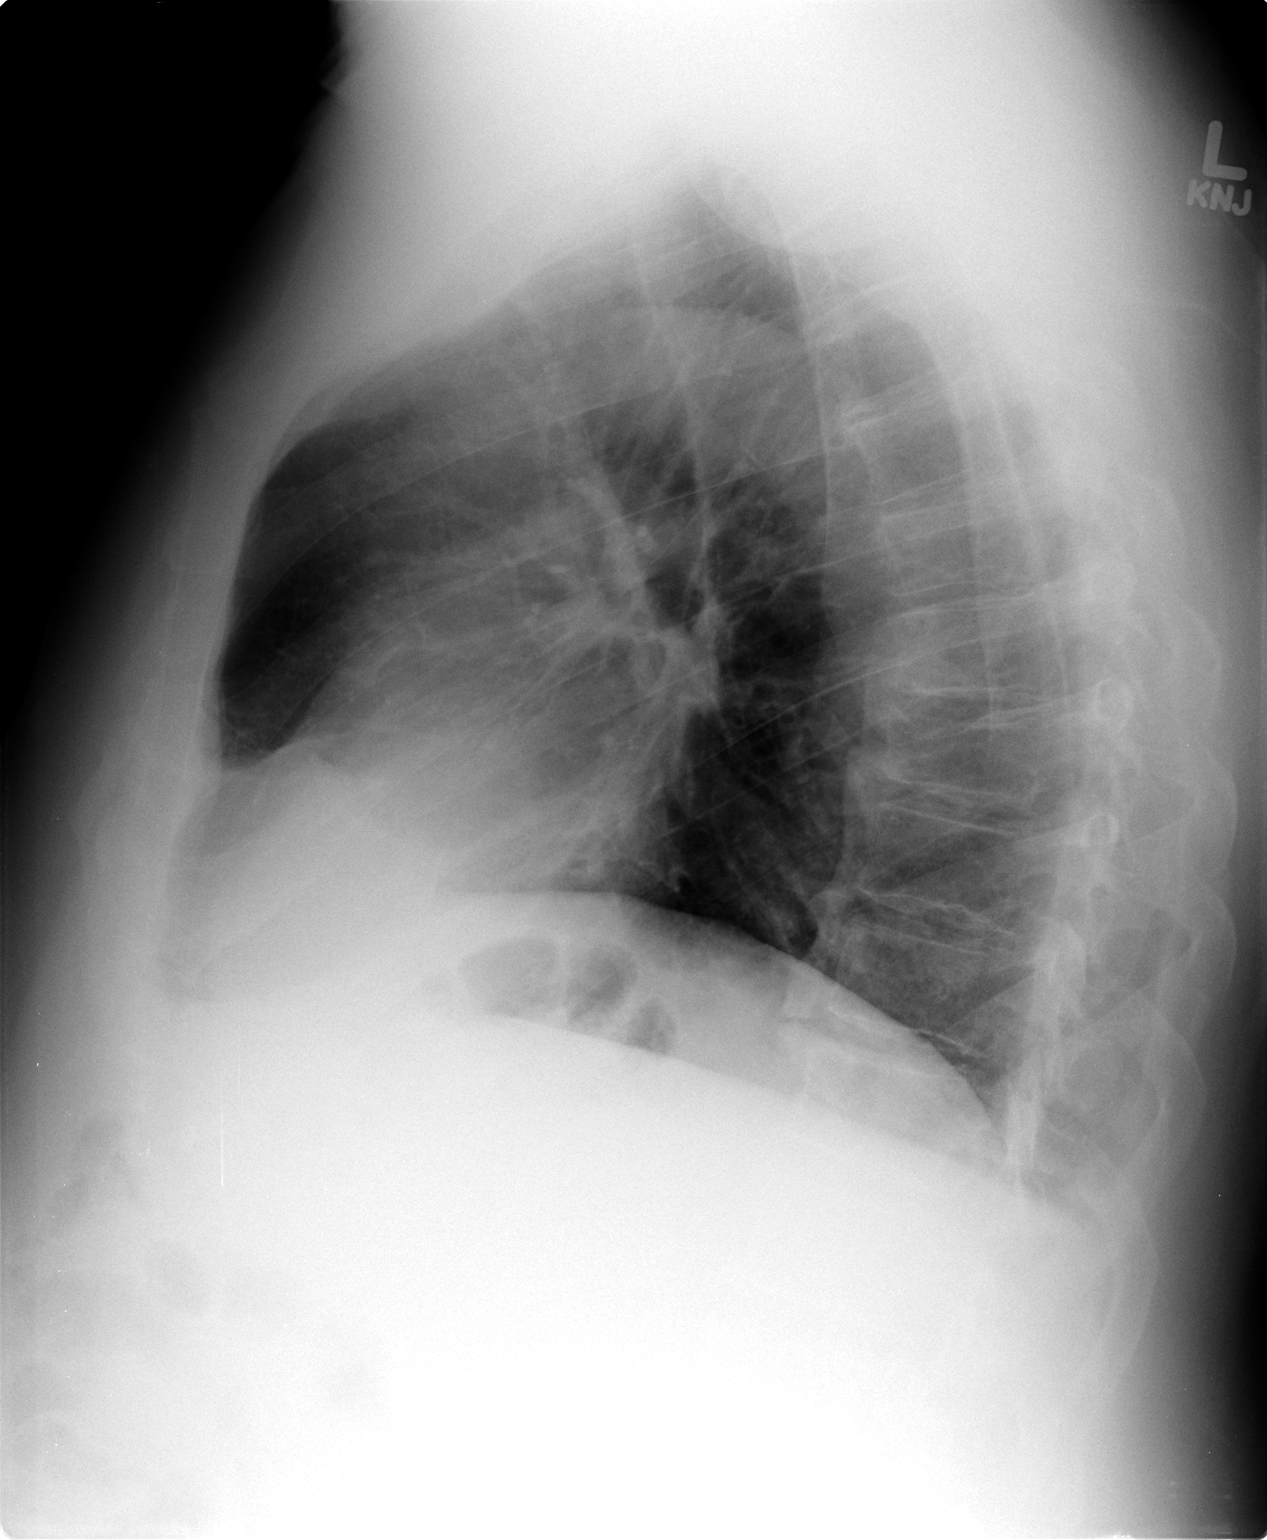

[2 of 2 positions shown; findings below may reference images not displayed]

FINDINGS: Mild hyperinflation. Midline trachea.  Normal heart size
and mediastinal contours.  Bibasilar scarring.
IMPRESSION: COPD/hyperinflation with bibasilar scarring. No acute superimposed
process.

## 2013-05-01 ENCOUNTER — Encounter: Payer: Self-pay | Admitting: Vascular Surgery

## 2013-05-01 DIAGNOSIS — M76899 Other specified enthesopathies of unspecified lower limb, excluding foot: Secondary | ICD-10-CM | POA: Diagnosis not present

## 2013-05-01 DIAGNOSIS — Z6829 Body mass index (BMI) 29.0-29.9, adult: Secondary | ICD-10-CM | POA: Diagnosis not present

## 2013-05-01 IMAGING — CT CT PARANASAL SINUSES LIMITED
2 series · 17 of 24 positions shown, 20 images · non-contrast
Comparison: Sinus films of 11/01/2008 and CT limited sinus of
04/16/2004

CLINICAL DATA: Chronic cough, congestion, sinus discharge

CT LIMITED SINUSES WITHOUT CONTRAST
TECHNIQUE: Multidetector CT images of the paranasal sinuses were
obtained in a single plane without contrast.

[Series 2: prone cor · axial · 0.29mm/px · z∈[+33,+123]mm · 10 of 12 slices shown, 13 images]
[im 2/12  brain]
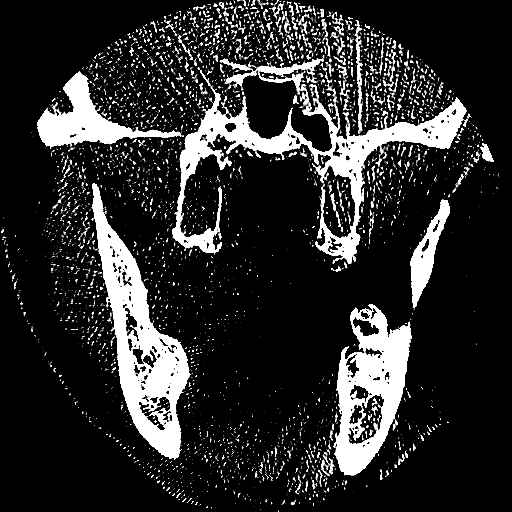
[im 2/12  bone]
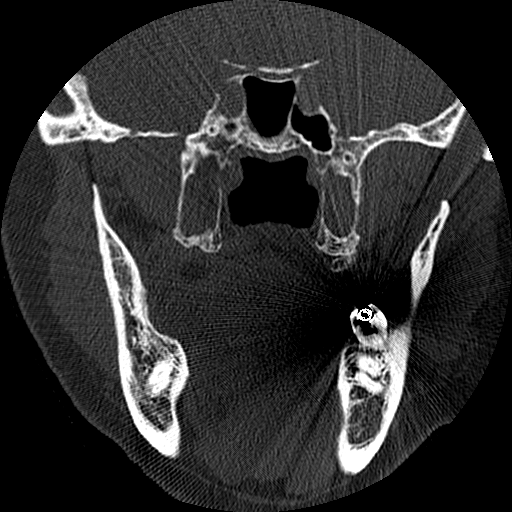
[im 3/12  bone]
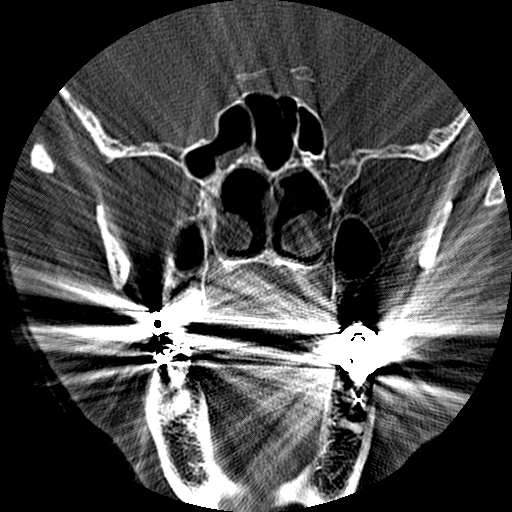
[im 4/12  bone]
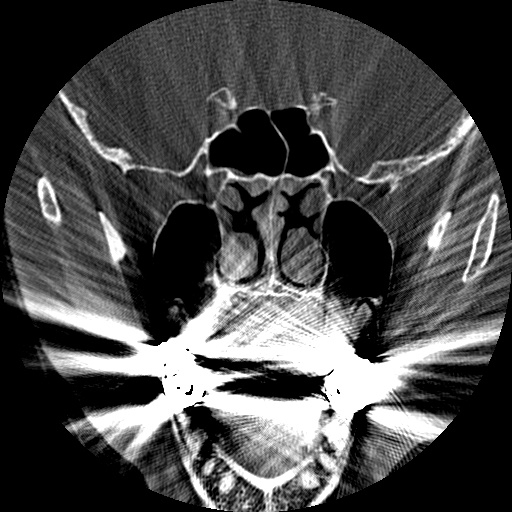
[im 5/12  bone]
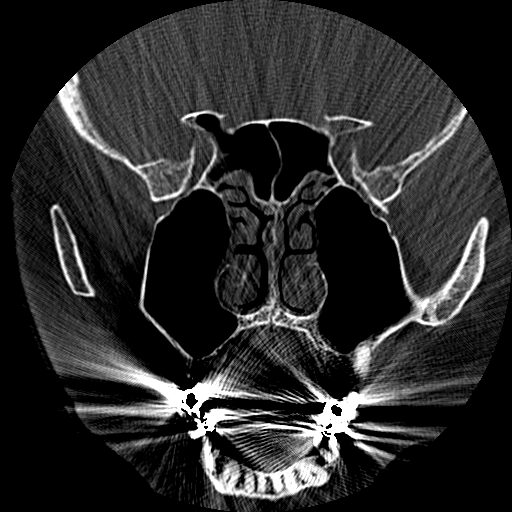
[im 6/12  brain]
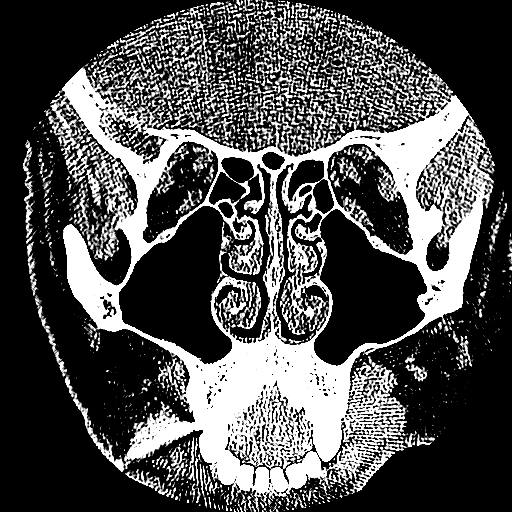
[im 6/12  bone]
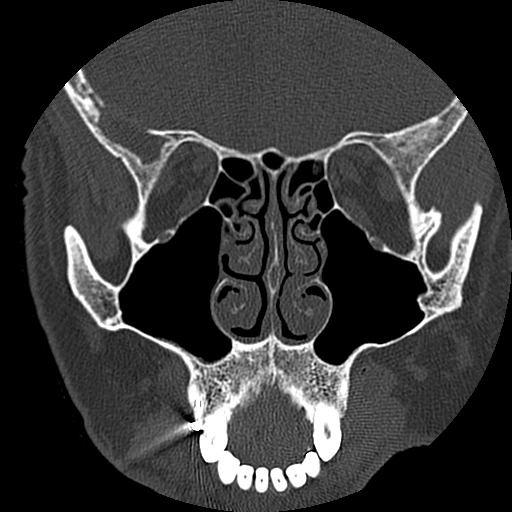
[im 7/12  bone]
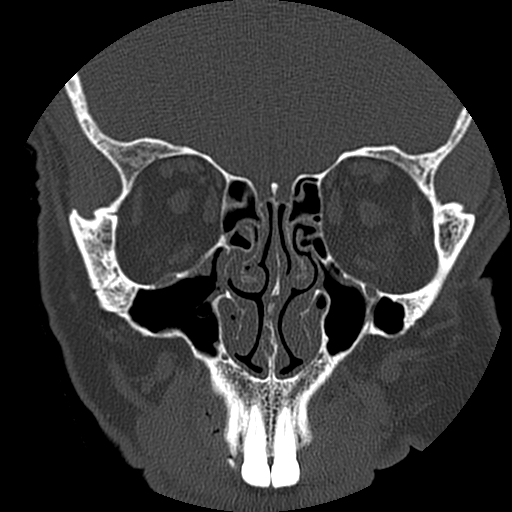
[im 8/12  bone]
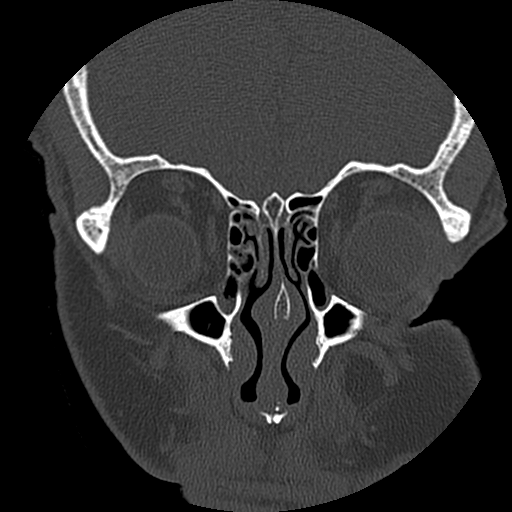
[im 9/12  bone]
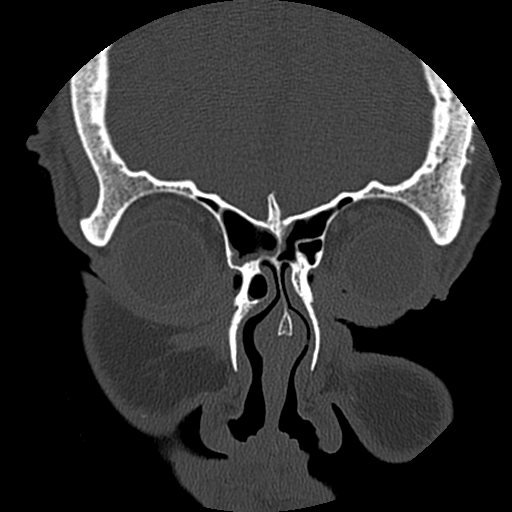
[im 10/12  brain]
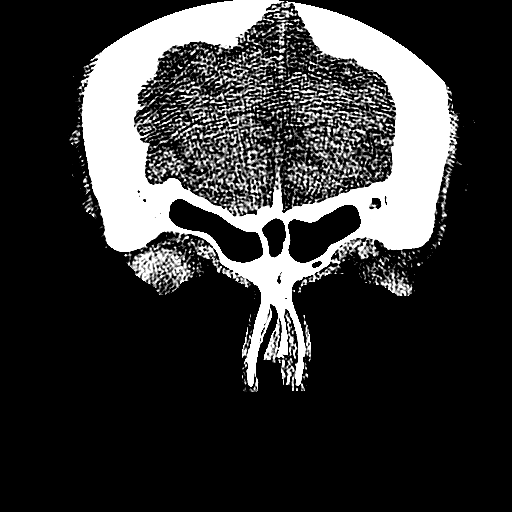
[im 10/12  bone]
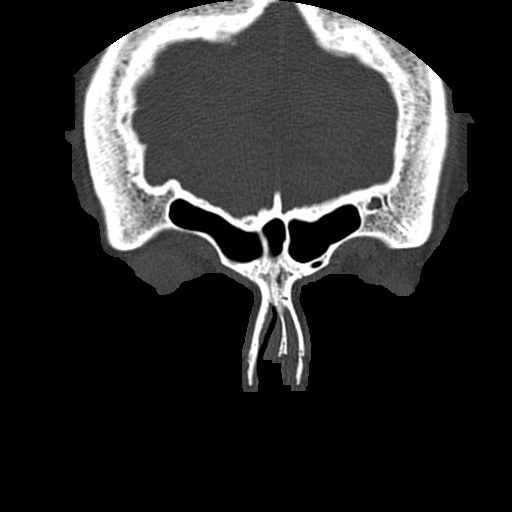
[im 11/12  bone]
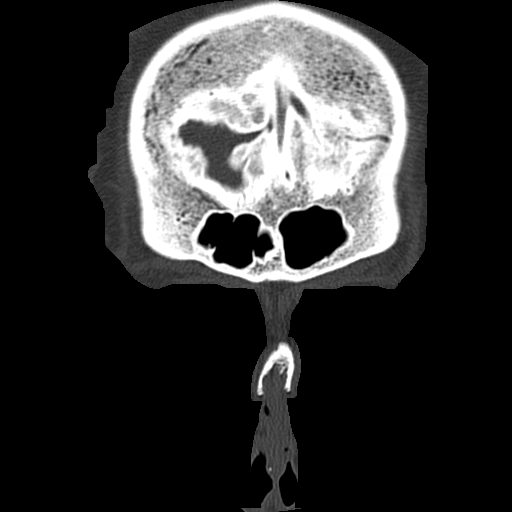

[Series 3: coronal soft · axial · 0.29mm/px · z∈[+33,+113]mm · 7 of 12 slices shown]
[im 2/12  brain]
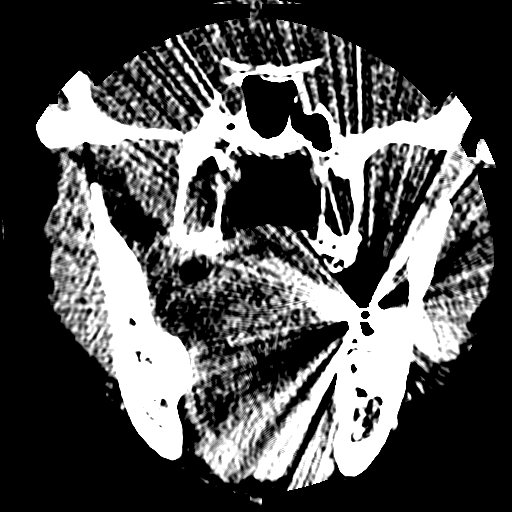
[im 3/12  brain]
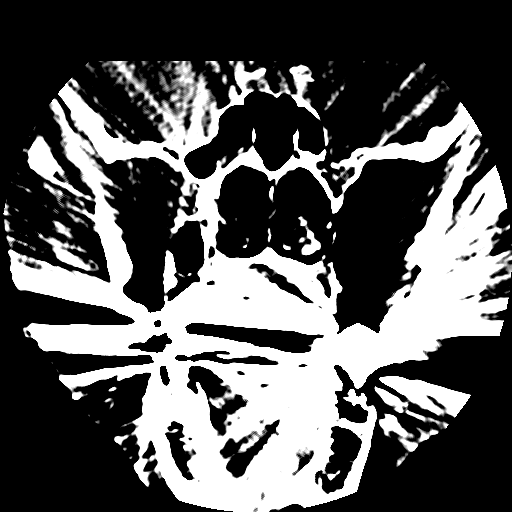
[im 5/12  brain]
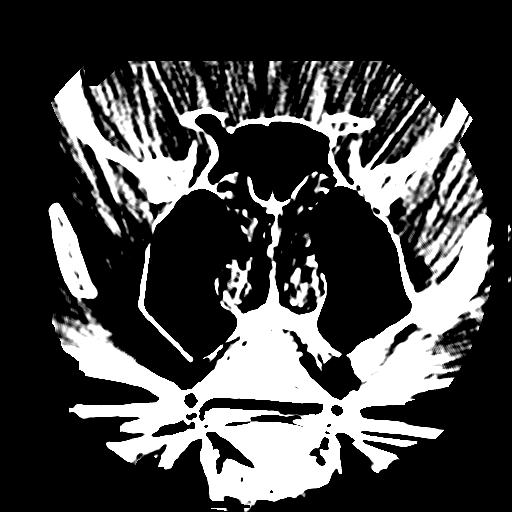
[im 6/12  brain]
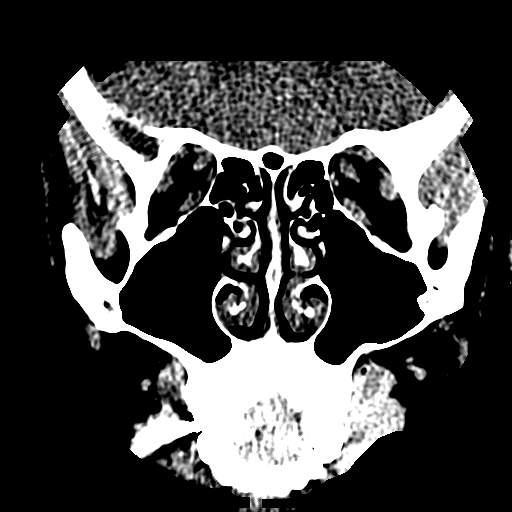
[im 7/12  brain]
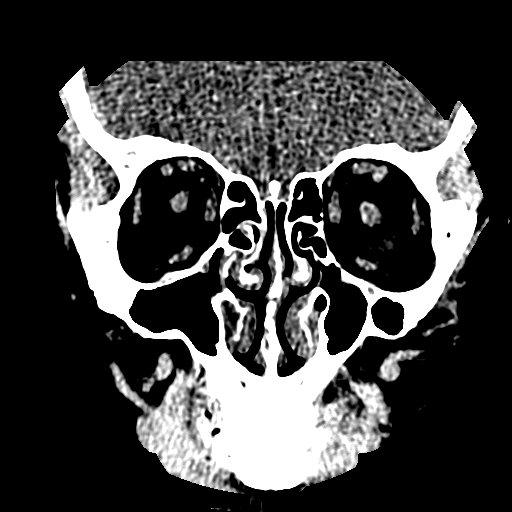
[im 8/12  brain]
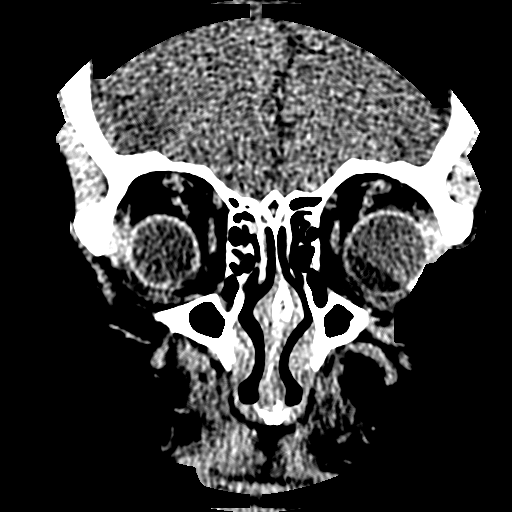
[im 10/12  brain]
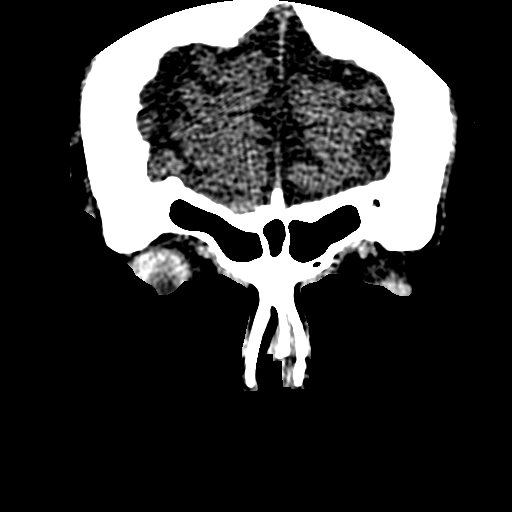

[17 of 24 positions shown; findings below may reference images not displayed]

FINDINGS: There is only mild mucosal thickening within the
anterior maxillary sinuses, a few ethmoid air cells and within the
frontal sinuses.  No air-fluid level is seen to indicate active
sinusitis.  The nasal turbinates are prominent, with some nasal
mucosal edema, which results in some compromise of the nasal
airway.  No significant nasal septal deviation is seen.  No bony
abnormality is noted.
IMPRESSION: 1.  Mild mucosal thickening in the maxillary, ethmoid, and frontal
sinuses.  No air-fluid level.
2.  Prominent nasal turbinates and nasal mucosal edema compromising
the nasal airway.

## 2013-05-02 ENCOUNTER — Encounter: Payer: Self-pay | Admitting: Vascular Surgery

## 2013-05-02 ENCOUNTER — Ambulatory Visit (INDEPENDENT_AMBULATORY_CARE_PROVIDER_SITE_OTHER): Payer: Medicare Other | Admitting: Vascular Surgery

## 2013-05-02 ENCOUNTER — Other Ambulatory Visit: Payer: Self-pay | Admitting: *Deleted

## 2013-05-02 VITALS — BP 147/76 | HR 69 | Resp 18 | Ht 74.0 in | Wt 270.0 lb

## 2013-05-02 DIAGNOSIS — I714 Abdominal aortic aneurysm, without rupture, unspecified: Secondary | ICD-10-CM

## 2013-05-02 NOTE — Progress Notes (Signed)
Subjective:     Patient ID: [name] [name], male   DOB: [birth year], 68 y.o.   MRN: [id number]  HPI 68 year old male was referred by Dr. 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 Patient went to the emergency department recently at [facility] because of left flank discomfort. Thorough evaluation included CT scan of the abdomen. This revealed a 3.2 cm elevation of the terminal aorta with no evidence of dissection or leakage. His flank pain disappeared within 7 days and has not reoccurred. He is seen today for evaluation of a small aneurysm. He denies any previous history of similar pain on either side.  Past Medical History  Diagnosis Date  . BPH (benign prostatic hypertrophy)   . Spinal stenosis     congential  . Hypertension   . Asthma   . History of blood clots     in legs and lungs following back surgery in 2010  . Allergic rhinitis   . Atrial fibrillation   . 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 Cataract     REMOVED  . Clotting disorder     BLOOD CLOTS IN LEGS/LUNGS -2010  . COPD (chronic obstructive pulmonary disease)   . GERD (gastroesophageal reflux disease)   . Diverticulosis     History  Substance Use Topics  . Smoking status: Former Smoker -- 2.00 packs/day for 40 years    Types: Cigarettes    Quit date: 03/09/2000  . Smokeless tobacco: Never Used     Comment: smoked 1964-2002, up to 3-4 ppd  . Alcohol Use: Yes     Comment:  rarely    Family History  Problem Relation Age of Onset  . Atrial fibrillation Mother   . COPD Mother   . Heart disease Father   . Colon cancer Father   . Heart attack Brother 46  . Alcohol abuse Brother 58    died post liver transplant    Allergies  Allergen Reactions  . Penicillins Anaphylaxis     Because of a history of documented adverse serious drug reaction;Medi Alert bracelet  is recommended    Current outpatient prescriptions:albuterol (PROAIR HFA) 108 (90 BASE) MCG/ACT inhaler, Inhale 2 puffs into the  lungs every 6 (six) hours as needed for wheezing or shortness of breath., Disp: , Rfl: ;  budesonide-formoterol (SYMBICORT) 160-4.5 MCG/ACT inhaler, Inhale 2 puffs into the lungs 2 (two) times daily., Disp: 1 Inhaler, Rfl: 3 cyclobenzaprine (FLEXERIL) 10 MG tablet, Take 1 tablet (10 mg total) by mouth 2 (two) times daily as needed for muscle spasms., Disp: 20 tablet, Rfl: 0;  diclofenac (VOLTAREN) 75 MG EC tablet, Take 1 tablet by mouth 2 (two) times daily., Disp: , Rfl: ;  diltiazem (DILACOR XR) 240 MG 24 hr capsule, Take 240 mg by mouth at bedtime., Disp: , Rfl:  flecainide (TAMBOCOR) 150 MG tablet, Take 1 tablet (150 mg total) by mouth 2 (two) times daily., Disp: 180 tablet, Rfl: 3;  HYDROmorphone (DILAUDID) 2 MG tablet, Take 1 tablet (2 mg total) by mouth every 4 (four) hours as needed for severe pain., Disp: 20 tablet, Rfl: 0;  LORazepam (ATIVAN) 0.5 MG tablet, Take 0.5 mg by mouth at bedtime as needed for anxiety or sleep., Disp: , Rfl:  metoprolol succinate (TOPROL-XL) 25 MG 24 hr tablet, Take 25 mg by mouth at bedtime., Disp: , Rfl: ;  omeprazole (PRILOSEC) 20 MG capsule, Take 20 mg by mouth at bedtime., Disp: , Rfl: ;  PRESCRIPTION MEDICATION, Take 2 tablets by mouth  once., Disp: , Rfl: ;  sertraline (ZOLOFT) 50 MG tablet, Take 50 mg by mouth at bedtime., Disp: , Rfl:  warfarin (COUMADIN) 3 MG tablet, Take 2 tabs (6mg ) on Tues, Thurs, Sun. And take 1&1/2 tabs (4.5mg ) all other days., Disp: , Rfl: ;  oxyCODONE-acetaminophen (PERCOCET/ROXICET) 5-325 MG per tablet, Take 1 tablet by mouth every 4 (four) hours as needed for severe pain., Disp: 15 tablet, Rfl: 0  BP 147/76  Pulse 69  Resp 18  Ht 6\' 2"  (1.88 m)  Wt 270 lb (122.471 kg)  BMI 34.65 kg/m2  Body mass index is 34.65 kg/(m^2).           Review of Systems has chronic back pain due to 2 previous lumbar spine procedures and also has had bilateral hip or placements. Does have dyspnea on exertion. Denies chest pain, orthopnea,  hemoptysis. Does have COPD with wheezing and occasional numbness in arms. Has history of ventral fibrillation on chronic anticoagulations with Coumadin. All other systems negative and complete review of systems    Objective:   Physical Exam BP 147/76  Pulse 69  Resp 18  Ht 6\' 2"  (1.88 m)  Wt 270 lb (122.471 kg)  BMI 34.65 kg/m2  Gen.-alert and oriented x3 in no apparent distress HEENT normal for age Lungs no rhonchi or wheezing Cardiovascular regular rhythm no murmurs carotid pulses 3+ palpable no bruits audible Abdomen soft nontender no palpable masses--obese Musculoskeletal free of  major deformities Skin clear -no rashes Neurologic normal Lower extremities 3+ femoral and dorsalis pedis pulses palpable bilaterally with no edema  Today I reviewed the CT scan of the abdomen [facility]. I agree that there is no concerning aortic aneurysm although there is mild dilatation of the terminal aorta up to 3.2 cm maximum diameter and some calcification and iliac arteries.        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 Unlikely this will ever require treatment. Discussed this at length with the patient including symptoms of rupture.  Return in 2 years with duplex scan of abdominal aortic aneurysm and see nurse practitioner

## 2013-05-09 ENCOUNTER — Encounter: Payer: Medicare Other | Admitting: Vascular Surgery

## 2013-05-11 ENCOUNTER — Ambulatory Visit (INDEPENDENT_AMBULATORY_CARE_PROVIDER_SITE_OTHER): Payer: Medicare Other

## 2013-05-11 DIAGNOSIS — Z86718 Personal history of other venous thrombosis and embolism: Secondary | ICD-10-CM | POA: Diagnosis not present

## 2013-05-11 DIAGNOSIS — I4891 Unspecified atrial fibrillation: Secondary | ICD-10-CM

## 2013-05-11 LAB — POCT INR: INR: 2.6

## 2013-05-23 ENCOUNTER — Ambulatory Visit: Payer: Medicare Other | Admitting: Internal Medicine

## 2013-05-29 ENCOUNTER — Encounter: Payer: Self-pay | Admitting: Cardiology

## 2013-05-29 ENCOUNTER — Ambulatory Visit (INDEPENDENT_AMBULATORY_CARE_PROVIDER_SITE_OTHER): Payer: Medicare Other | Admitting: Cardiology

## 2013-05-29 ENCOUNTER — Telehealth: Payer: Self-pay | Admitting: Cardiology

## 2013-05-29 VITALS — BP 120/75 | HR 64 | Ht 74.0 in | Wt 287.0 lb

## 2013-05-29 DIAGNOSIS — I4891 Unspecified atrial fibrillation: Secondary | ICD-10-CM

## 2013-05-29 DIAGNOSIS — I428 Other cardiomyopathies: Secondary | ICD-10-CM

## 2013-05-29 DIAGNOSIS — I10 Essential (primary) hypertension: Secondary | ICD-10-CM

## 2013-05-29 DIAGNOSIS — R0602 Shortness of breath: Secondary | ICD-10-CM

## 2013-05-29 NOTE — Telephone Encounter (Signed)
Pt still very SOB with any activity.  Added onto Dr Hochrein's schedule today.

## 2013-05-29 NOTE — Patient Instructions (Addendum)
Please stop your Cardizem and Metoprolol. Continue all other medications as listed.  Your physician has recommended that you wear a holter monitor in 7 days. Holter monitors are medical devices that record the heart's electrical activity. Doctors most often use these monitors to diagnose arrhythmias. Arrhythmias are problems with the speed or rhythm of the heartbeat. The monitor is a small, portable device. You can wear one while you do your normal daily activities. This is usually used to diagnose what is causing palpitations/syncope (passing out).  Follow up with PA/NP after wearing monitor.

## 2013-05-29 NOTE — Progress Notes (Signed)
HPI The patient presents for followup of his paroxysmal atrial fibrillation. He was actually to my clinic today because his been complaining of dizziness. This has been happening for about 2 weeks it is getting worse over the last couple of days. He says that with any activity he's feeling lightheadedness just walking across a room for instance. He feels like his heart might be racing. However, today walking in here when he got the symptoms his heart rate was slow. He was not actually in fibrillation. He is not describing chest pressure, neck or arm discomfort. He does get short of breath with this activity but is not describing new PND or orthopnea. He does feel somewhat lightheaded when he stands up as well. He's not had any frank syncope. He did have dizziness test as well. He had some chest discomfort with dyspnea but had a negative stress perfusion study in April of last year. Your in the office today he was not orthostatic.  Walking him around the office briskly he had the same symptoms but he is oxygen saturations stated in the 90s. However, his heart rate never increased above 76.  Allergies  Allergen Reactions  . Penicillins Anaphylaxis     Because of a history of documented adverse serious drug reaction;Medi Alert bracelet  is recommended    Current Outpatient Prescriptions  Medication Sig Dispense Refill  . albuterol (PROAIR HFA) 108 (90 BASE) MCG/ACT inhaler Inhale 2 puffs into the lungs every 6 (six) hours as needed for wheezing or shortness of breath.      . budesonide-formoterol (SYMBICORT) 160-4.5 MCG/ACT inhaler Inhale 2 puffs into the lungs 2 (two) times daily.  1 Inhaler  3  . cyclobenzaprine (FLEXERIL) 10 MG tablet Take 1 tablet (10 mg total) by mouth 2 (two) times daily as needed for muscle spasms.  20 tablet  0  . diclofenac (VOLTAREN) 75 MG EC tablet Take 1 tablet by mouth 2 (two) times daily.      [name] diltiazem (DILACOR XR) 240 MG 24 hr capsule Take 240 mg by mouth at  bedtime.      . flecainide (TAMBOCOR) 150 MG tablet Take 1 tablet (150 mg total) by mouth 2 (two) times daily.  180 tablet  3  . HYDROmorphone (DILAUDID) 2 MG tablet Take 1 tablet (2 mg total) by mouth every 4 (four) hours as needed for severe pain.  20 tablet  0  . LORazepam (ATIVAN) 0.5 MG tablet Take 0.5 mg by mouth at bedtime as needed for anxiety or sleep.      . metoprolol succinate (TOPROL-XL) 25 MG 24 hr tablet Take 25 mg by mouth at bedtime.      [name] omeprazole (PRILOSEC) 20 MG capsule Take 20 mg by mouth at bedtime.      [name] oxyCODONE-acetaminophen (PERCOCET/ROXICET) 5-325 MG per tablet Take 1 tablet by mouth every 4 (four) hours as needed for severe pain.  15 tablet  0  . PRESCRIPTION MEDICATION Take 2 tablets by mouth once.      . sertraline (ZOLOFT) 50 MG tablet Take 50 mg by mouth at bedtime.      [name] warfarin (COUMADIN) 3 MG tablet Take 2 tabs (6mg ) on Tues, Thurs, Sun. And take 1&1/2 tabs (4.5mg ) all other days.       No current facility-administered medications for this visit.    Past Medical History  Diagnosis Date  . BPH (benign prostatic hypertrophy)   . Spinal stenosis     congential  . Hypertension   .  Asthma   . History of blood clots     in legs and lungs following back surgery in 2010  . Allergic rhinitis   . Atrial fibrillation   . 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 Cataract     REMOVED  . Clotting disorder     BLOOD CLOTS IN LEGS/LUNGS -2010  . COPD (chronic obstructive pulmonary disease)   . GERD (gastroesophageal reflux disease)   . Diverticulosis     Past Surgical History  Procedure Laterality Date  . Vasectomy    . Lumbar fusion      x2  . Total hip arthroplasty      bilat  . Cataracts    . Colonoscopy with polypectomy  2004    ROS:  As stated in the HPI and negative for all other systems.  PHYSICAL EXAM BP 122/68  Pulse 52  Ht 6\' 2"  (1.88 m)  Wt 287 lb (130.182 kg)  BMI 36.83 kg/m2 GENERAL:  Well appearing HEENT:  Pupils equal round and  reactive, fundi not visualized, oral mucosa unremarkable NECK:  No jugular venous distention, waveform within normal limits, carotid upstroke brisk and symmetric, no bruits, no thyromegaly LYMPHATICS:  No cervical, inguinal adenopathy LUNGS:  Clear to auscultation bilaterally BACK:  No CVA tenderness CHEST:  Unremarkable HEART:  PMI not displaced or sustained,S1 and S2 within normal limits, no S3, no S4, no clicks, no rubs, no murmurs ABD:  Flat, positive bowel sounds normal in frequency in pitch, no bruits, no rebound, no guarding, no midline pulsatile mass, no hepatomegaly, no splenomegaly, obese EXT:  2 plus pulses throughout, no edema, no cyanosis no clubbing SKIN:  No rashes no nodules NEURO:  Cranial nerves II through XII grossly intact, motor grossly intact throughout PSYCH:  Cognitively intact, oriented to person place and time  EKG:  Sinus rhythm, rate 52, axis within normal limits, first degree AV block no acute ST-T wave changes.  05/29/2013  ASSESSMENT AND PLAN  ATRIAL FIBRILLATION:  I don't think this is related to the current symptoms.  I will be changing his Cardizem as below. He'll otherwise remain on warfarin..  DYSPNEA/DIZZINESS:  He did not have an appropriate heart rate response walking. I going to stop his Cardizem and his beta blocker. In about 7 days and going to reassess with a 24-hour Holter. If he gets worse rather than better before I see him he will need to present to the emergency room.  ARM PAIN:  This is really unchanged since he had his stress test last year. However, I will have a low threshold for further ischemia workup if his symptoms don't improve off of the Cardizem and beta blocker.[name]  HTN:  The blood pressure is at target. However, I will be making the changes as listed below.

## 2013-05-29 NOTE — Telephone Encounter (Signed)
New Message:  Wife states pt has been SOB over the weekend. C/o being weak and exhausted. Wife states he is not SOB now.. States it is only with activity that he becomes SOB... Wife states he has COPD...  Pt is wanting to see Dr. Percival Spanish today. PT is requesting a call back from [facility].

## 2013-06-06 ENCOUNTER — Telehealth: Payer: Self-pay | Admitting: Cardiology

## 2013-06-06 ENCOUNTER — Encounter (INDEPENDENT_AMBULATORY_CARE_PROVIDER_SITE_OTHER): Payer: Medicare Other

## 2013-06-06 ENCOUNTER — Encounter: Payer: Self-pay | Admitting: *Deleted

## 2013-06-06 DIAGNOSIS — I4891 Unspecified atrial fibrillation: Secondary | ICD-10-CM

## 2013-06-06 DIAGNOSIS — R0602 Shortness of breath: Secondary | ICD-10-CM | POA: Diagnosis not present

## 2013-06-06 NOTE — Telephone Encounter (Signed)
Per wife calling - states she is concerned because pt is continually weak with occasional dizziness.  She has seen no improvement with him being off cardizem and metoprolol.  He is keeping a BP dairy but she doesn't know what the readings are.  She reports that his color doesn't look good, he complains of SOB without production but a rattle in his chest when in the recliner or at night to sleep.  She states he lays down a lot during the day and is sleeping a lot.  She reports that he is not eating well but then says his wt is up because he is snacking a lot.  He c/o being exhausted and weak to the point that he can't take off his own socks.  I asked her if she feels like he is depressed and she states that she is unsure.  That pt wants to do things but then once he starts he just says he's too tired.  She reports that since his back surgery 5 yrs ago he has not been able to do things like he used to and he maybe depressed.  Suggested she try to have him increase his activity level.  She says last week he was going to go for a walk with her but could only make it to the mailbox.  Pt is wearing holter monitor now.  Has a f/u appt with Richardson Dopp, PA 4/10.  Suggested she call PCP to eval for depression and f/u on COPD.  Wife will call his PCP to schedule an appointment.

## 2013-06-06 NOTE — Telephone Encounter (Signed)
New message     Wife want nurse to know that patient is extremely tired---he has no energy. Will the results of the monitor be able to help determine what is wrong?

## 2013-06-06 NOTE — Progress Notes (Signed)
Patient ID: [name] [name], male   DOB: [birth year], 68 y.o.   MRN: [id number] E-Cardio 24 hour holter monitor applied to patient.

## 2013-06-07 ENCOUNTER — Ambulatory Visit: Payer: Medicare Other | Admitting: Internal Medicine

## 2013-06-08 ENCOUNTER — Ambulatory Visit: Payer: Medicare Other | Admitting: Internal Medicine

## 2013-06-08 ENCOUNTER — Ambulatory Visit (INDEPENDENT_AMBULATORY_CARE_PROVIDER_SITE_OTHER): Payer: Medicare Other | Admitting: Internal Medicine

## 2013-06-08 ENCOUNTER — Encounter: Payer: Self-pay | Admitting: Internal Medicine

## 2013-06-08 VITALS — BP 148/90 | HR 82 | Temp 97.4°F | Resp 14 | Wt 278.8 lb

## 2013-06-08 DIAGNOSIS — R0989 Other specified symptoms and signs involving the circulatory and respiratory systems: Secondary | ICD-10-CM | POA: Diagnosis not present

## 2013-06-08 DIAGNOSIS — R0609 Other forms of dyspnea: Secondary | ICD-10-CM

## 2013-06-08 MED ORDER — IPRATROPIUM-ALBUTEROL 20-100 MCG/ACT IN AERS: 1.0000 | INHALATION_SPRAY | Freq: Four times a day (QID) | RESPIRATORY_TRACT | Status: DC

## 2013-06-08 NOTE — Patient Instructions (Signed)
Please use the Combivent Respimat one puff every 6 hours if having shortness of breath with exertion

## 2013-06-08 NOTE — Progress Notes (Signed)
Pre visit review using our clinic review tool, if applicable. No additional management support is needed unless otherwise documented below in the visit note. 

## 2013-06-08 NOTE — Progress Notes (Signed)
   Subjective:    Patient ID: [name] [name], male    DOB: 01/14/1946, 68 y.o.   MRN: [id number]  HPI He presents with fatigue which he describes as being present for 2-3 weeks. It is aggravated by exercise or activity. Minimal exertion such as walking room to room will be associated with the dyspnea as well as palpitations.  He was seen by his cardiologist; that note was reviewed. Pending are the results of the 24-hour Holter monitor which was just returned.  His medications are being adjusted in attempt to prevent the persistent bradycardia even with exercise  He describes muscle weakness from the waist up with exertion.   Review of Systems He did have night sweats last night. He has not had associated fever or chills.  He has no blurred vision, double vision, or vision loss.  He's had no hoarseness or difficulty swallowing. No associated food dysphagia.  He also has some postural hypotension symptoms with dizziness and lightheadedness. There is no frank vertigo.  He does note wheezing intermittently. His symptoms have not been responsive to the Symbicort.        Objective:   Physical Exam Appears  well-nourished & in no acute distress. Central weight excess  No carotid bruits are present.No neck pain distention present at 10 - 15 degrees. Thyroid normal to palpation  Heart rhythm and rate are normal with no gallop or murmur  Chest :low grade scattered rhonchi no increased work of breathing. O2 sats 93 % post ambulating 41 yards  There is no evidence of aortic aneurysm or renal artery bruits  Abdomen soft but massive with no organomegaly or masses. No HJR  No clubbing, cyanosis or edema present.  Pedal pulses are intact but decreased  No ischemic skin changes are present . Fingernails healthy   Alert and oriented. Strength, tone normal. Exhibits low back crawl reclining & arising from exam table          Assessment & Plan:  #1 DOE & with waist flexion. No  significant response to Symbicort.#2 palpitations with #1 Plan: await results of Holter monitor  Trial of Combivent Respimat ; consider PFTs

## 2013-06-09 NOTE — Telephone Encounter (Signed)
The patient was evaluated by Dr. Linna Darner since this note and there were no acute findings.  Holter results pending.

## 2013-06-10 ENCOUNTER — Other Ambulatory Visit: Payer: Self-pay | Admitting: Internal Medicine

## 2013-06-12 ENCOUNTER — Other Ambulatory Visit: Payer: Self-pay | Admitting: Internal Medicine

## 2013-06-12 MED ORDER — LORAZEPAM 0.5 MG PO TABS: 0.5000 mg | ORAL_TABLET | Freq: Every evening | ORAL | Status: DC | PRN

## 2013-06-12 NOTE — Telephone Encounter (Signed)
Last seen 06/08/13

## 2013-06-13 NOTE — Telephone Encounter (Signed)
Rx printed and faxed to the pharmacy.//AB/CMA 

## 2013-06-16 ENCOUNTER — Ambulatory Visit (INDEPENDENT_AMBULATORY_CARE_PROVIDER_SITE_OTHER)
Admission: RE | Admit: 2013-06-16 | Discharge: 2013-06-16 | Disposition: A | Discharge status: Home / Self Care | Payer: Medicare Other | Source: Ambulatory Visit | Attending: Physician Assistant | Admitting: Physician Assistant

## 2013-06-16 ENCOUNTER — Ambulatory Visit (INDEPENDENT_AMBULATORY_CARE_PROVIDER_SITE_OTHER): Payer: Medicare Other | Admitting: Physician Assistant

## 2013-06-16 ENCOUNTER — Telehealth: Payer: Self-pay | Admitting: *Deleted

## 2013-06-16 ENCOUNTER — Encounter: Payer: Self-pay | Admitting: Physician Assistant

## 2013-06-16 ENCOUNTER — Ambulatory Visit (INDEPENDENT_AMBULATORY_CARE_PROVIDER_SITE_OTHER): Payer: Medicare Other

## 2013-06-16 VITALS — BP 144/98 | HR 70 | Ht 74.0 in | Wt 280.0 lb

## 2013-06-16 DIAGNOSIS — J449 Chronic obstructive pulmonary disease, unspecified: Secondary | ICD-10-CM | POA: Diagnosis not present

## 2013-06-16 DIAGNOSIS — I4891 Unspecified atrial fibrillation: Secondary | ICD-10-CM

## 2013-06-16 DIAGNOSIS — I10 Essential (primary) hypertension: Secondary | ICD-10-CM

## 2013-06-16 DIAGNOSIS — R42 Dizziness and giddiness: Secondary | ICD-10-CM

## 2013-06-16 DIAGNOSIS — Z86718 Personal history of other venous thrombosis and embolism: Secondary | ICD-10-CM

## 2013-06-16 DIAGNOSIS — R0602 Shortness of breath: Secondary | ICD-10-CM

## 2013-06-16 LAB — BASIC METABOLIC PANEL
BUN: 15 mg/dL (ref 6–23)
CALCIUM: 9.3 mg/dL (ref 8.4–10.5)
CO2: 23 mEq/L (ref 19–32)
CREATININE: 1.2 mg/dL (ref 0.4–1.5)
Chloride: 104 mEq/L (ref 96–112)
GFR: 61.39 mL/min (ref >60.00)
GLUCOSE: 83 mg/dL (ref 70–99)
POTASSIUM: 4.7 meq/L (ref 3.5–5.1)
Sodium: 135 mEq/L (ref 135–145)

## 2013-06-16 LAB — POCT INR: INR: 2

## 2013-06-16 LAB — BRAIN NATRIURETIC PEPTIDE: Pro B Natriuretic peptide (BNP): 38 pg/mL (ref 0.0–100.0)

## 2013-06-16 MED ORDER — AMLODIPINE BESYLATE 5 MG PO TABS: 5.0000 mg | ORAL_TABLET | Freq: Every day | ORAL | Status: DC

## 2013-06-16 NOTE — Telephone Encounter (Signed)
Advised patient

## 2013-06-16 NOTE — Patient Instructions (Addendum)
Will obtain labs today and call you with the results (bmet/bnp)  We talked about chronotropic incompetence today.  That means that someone's heart rate does not increase with activity.  We will do the stress test to see what your hear rate does (off medications that slow your heart rate) with exercise.   START AMLODIPINE 5 MG DAILY   Your physician has requested that you have en exercise stress myoview. For further information please visit [email or web address]. Please follow instruction sheet, as given.  Will have you go to the [place] across from [facility] soon for a chest Xray. No appointment needed, go between the hours of 8:30-4:00  Your physician recommends that you schedule a follow-up appointment in: 2-3 weeks with Dr Percival Spanish or Nicki Reaper PA on a day he is in the office

## 2013-06-16 NOTE — Progress Notes (Signed)
[facility], [address] [place], [place]  [address] Phone: [phone] Fax:  [phone]  Date:  06/16/2013   ID:  NIMA KEMPPAINEN, DOB December 15, 1945, MRN [id number]  PCP:  [name], MD  Cardiologist:  Dr. Minus Breeding    History of Present Illness: JOMAR DENZ is a 68 y.o. male with a history of paroxysmal atrial fibrillation, HTN, prior DVT and pulmonary emboli following back surgery in 2010, COPD. Patient was recently seen by Dr. Percival Spanish 05/29/13 with complaints of dizziness. Patient had no significant blood pressure drop with standing. With ambulation, his oxygen remained in the 90s but his heart rate never increased above 76. He was in normal sinus rhythm. Dr. Percival Spanish stopped his Cardizem and beta blocker. He was placed on the 24-hour Holter monitor. It was felt that he may require stress testing if his symptoms did not improve off of medication. He returns today for follow up.  The patient continues to feel dizzy with ambulation as well as dyspneic. He has not noticed any improvement. He does near syncopal at times. He denies frank syncope. He probably describes NYHA Class 2b symptoms. He continues to note left arm tightness. This is unchanged. He denies orthopnea, PND or edema. He denies increased abdominal girth. He was recently been placed on a new medicine for his COPD. He is not certain which one.  Studies:  - Echo (06/29/07):  EF 55-60%, no RWMA, mild LVH, mild focal basal septal hypertrophy, mild ascending Ao dilatation (41 mm), mild MR, mild to mod LAE.    - Nuclear (06/07/12):  No ischemia, EF 64%, NORMAL.    Recent Labs: 04/04/2013: HDL Cholesterol 41.80; LDL (calc) 110*; TSH 3.36  04/19/2013: ALT 24; Creatinine 1.08; Hemoglobin 12.8*; Potassium 4.5   Wt Readings from Last 3 Encounters:  06/16/13 280 lb (127.007 kg)  06/08/13 278 lb 12.8 oz (126.463 kg)  05/29/13 287 lb (130.182 kg)     Past Medical History  Diagnosis Date  . BPH (benign prostatic  hypertrophy)   . Spinal stenosis     congential  . Hypertension   . Asthma   . History of blood clots     in legs and lungs following back surgery in 2010  . Allergic rhinitis   . Atrial fibrillation   . 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 Cataract     REMOVED  . Clotting disorder     BLOOD CLOTS IN LEGS/LUNGS -2010  . COPD (chronic obstructive pulmonary disease)   . GERD (gastroesophageal reflux disease)   . Diverticulosis     Current Outpatient Prescriptions  Medication Sig Dispense Refill  . albuterol (PROAIR HFA) 108 (90 BASE) MCG/ACT inhaler Inhale 2 puffs into the lungs every 6 (six) hours as needed for wheezing or shortness of breath.      . budesonide-formoterol (SYMBICORT) 160-4.5 MCG/ACT inhaler Inhale 2 puffs into the lungs 2 (two) times daily.  1 Inhaler  3  . cyclobenzaprine (FLEXERIL) 10 MG tablet Take 1 tablet (10 mg total) by mouth 2 (two) times daily as needed for muscle spasms.  20 tablet  0  . diclofenac (VOLTAREN) 75 MG EC tablet Take 1 tablet by mouth 2 (two) times daily.      . flecainide (TAMBOCOR) 150 MG tablet Take 1 tablet (150 mg total) by mouth 2 (two) times daily.  180 tablet  3  . HYDROmorphone (DILAUDID) 2 MG tablet Take 1 tablet (2 mg total) by mouth every 4 (four) hours as needed for  severe pain.  20 tablet  0  . Ipratropium-Albuterol (COMBIVENT RESPIMAT) 20-100 MCG/ACT AERS respimat Inhale 1 puff into the lungs every 6 (six) hours.  4 g  5  . LORazepam (ATIVAN) 0.5 MG tablet Take 1 tablet (0.5 mg total) by mouth at bedtime as needed for anxiety or sleep.  30 tablet  0  . omeprazole (PRILOSEC) 20 MG capsule Take 20 mg by mouth at bedtime.      [name] oxyCODONE-acetaminophen (PERCOCET/ROXICET) 5-325 MG per tablet Take 1 tablet by mouth every 4 (four) hours as needed for severe pain.  15 tablet  0  . PRESCRIPTION MEDICATION Take 2 tablets by mouth once.      . sertraline (ZOLOFT) 50 MG tablet Take 50 mg by mouth at bedtime.      [name] warfarin (COUMADIN) 3 MG  tablet Take 2 tabs (6mg ) on Tues, Thurs, Sun. And take 1&1/2 tabs (4.5mg ) all other days.       No current facility-administered medications for this visit.    Allergies:   Penicillins   Social History:  The patient  reports that he quit smoking about 13 years ago. His smoking use included Cigarettes. He has a 80 pack-year smoking history. He has never used smokeless tobacco. He reports that he drinks alcohol. He reports that he does not use illicit drugs.   Family History:  The patient's family history includes Alcohol abuse (age of onset: 95) in his brother; Atrial fibrillation in his mother; COPD in his mother; Colon cancer in his father; Heart attack (age of onset: 55) in his brother; Heart disease in his father.   ROS:  Please see the history of present illness.      All other systems reviewed and negative.   PHYSICAL EXAM: VS:  BP 144/98  Pulse 70  Ht 6\' 2"  (1.88 m)  Wt 280 lb (127.007 kg)  BMI 35.93 kg/m2 Well nourished, well developed, in no acute distress HEENT: normal Neck: no JVD Cardiac:  distant S1, S2; RRR; no murmur Lungs:  Decreased breath sounds with expiratory rhonchi throughout Abd: soft, nontender, no hepatomegaly Ext: no edema Skin: warm and dry Neuro:  CNs 2-12 intact, no focal abnormalities noted  EKG:  NSR, HR 70, normal axis, interventricular conduction delay, nonspecific ST-T wave changes, first degree AV block     ASSESSMENT AND PLAN:  1. Dyspnea: Etiology not clear. It is quite possible his symptoms are related to COPD. However, given his dizziness and left arm pain, I will arrange a stress Myoview to rule out ischemia.  I will also check a basic metabolic panel and BNP. 2. Dizziness: As noted, he will be set up for a stress Myoview. We will place him on the treadmill to also rule out chronotropic incompetence as a cause for his dizziness. 3. Atrial Fibrillation: He is maintaining NSR. He remains on flecainide. Recent Holter monitor demonstrated sinus  rhythm and sinus bradycardia with PACs and PVCs. There was no significant arrhythmia. As he remains on flecainide, we need to ensure that he does not have ischemic heart disease. Therefore, he will be set up for stress Myoview. 4. Hypertension: This is uncontrolled. I will place him on amlodipine 5 mg daily. 5. Disposition: Follow up with me or Dr. Percival Spanish in the next 2-3 weeks.  Signed, Richardson Dopp, PA-C  06/16/2013 11:25 AM

## 2013-06-16 NOTE — Telephone Encounter (Signed)
Message copied by Earvin Hansen on Fri Jun 16, 2013  5:28 PM ------      Message from: [place], [place] T      Created: Fri Jun 16, 2013  2:43 PM       No acute findings.      Continue with current treatment plan.      Richardson Dopp, PA-C        06/16/2013 2:43 PM ------

## 2013-06-22 ENCOUNTER — Ambulatory Visit (HOSPITAL_COMMUNITY)
Admission: RE | Admit: 2013-06-22 | Discharge: 2013-06-22 | Disposition: A | Discharge status: Home / Self Care | Payer: Medicare Other | Source: Ambulatory Visit | Attending: Cardiology | Admitting: Cardiology

## 2013-06-22 DIAGNOSIS — R55 Syncope and collapse: Secondary | ICD-10-CM | POA: Insufficient documentation

## 2013-06-22 DIAGNOSIS — R42 Dizziness and giddiness: Secondary | ICD-10-CM | POA: Diagnosis not present

## 2013-06-22 DIAGNOSIS — I4891 Unspecified atrial fibrillation: Secondary | ICD-10-CM

## 2013-06-22 DIAGNOSIS — Z8249 Family history of ischemic heart disease and other diseases of the circulatory system: Secondary | ICD-10-CM | POA: Insufficient documentation

## 2013-06-22 DIAGNOSIS — Z87891 Personal history of nicotine dependence: Secondary | ICD-10-CM | POA: Diagnosis not present

## 2013-06-22 DIAGNOSIS — I10 Essential (primary) hypertension: Secondary | ICD-10-CM | POA: Diagnosis not present

## 2013-06-22 DIAGNOSIS — R0609 Other forms of dyspnea: Secondary | ICD-10-CM | POA: Insufficient documentation

## 2013-06-22 DIAGNOSIS — R0602 Shortness of breath: Secondary | ICD-10-CM | POA: Diagnosis not present

## 2013-06-22 DIAGNOSIS — R0989 Other specified symptoms and signs involving the circulatory and respiratory systems: Secondary | ICD-10-CM | POA: Insufficient documentation

## 2013-06-22 MED ORDER — TECHNETIUM TC 99M SESTAMIBI GENERIC - CARDIOLITE
10.9000 | Freq: Once | INTRAVENOUS | Status: AC | PRN
  Administered 2013-06-22: 10.9 via INTRAVENOUS

## 2013-06-22 MED ORDER — AMINOPHYLLINE 25 MG/ML IV SOLN
75.0000 mg | Freq: Once | INTRAVENOUS | Status: AC
  Administered 2013-06-22: 75 mg via INTRAVENOUS

## 2013-06-22 MED ORDER — REGADENOSON 0.4 MG/5ML IV SOLN
0.4000 mg | Freq: Once | INTRAVENOUS | Status: AC
  Administered 2013-06-22: 0.4 mg via INTRAVENOUS

## 2013-06-22 MED ORDER — TECHNETIUM TC 99M SESTAMIBI GENERIC - CARDIOLITE
30.2000 | Freq: Once | INTRAVENOUS | Status: AC | PRN
Start: 2013-06-22 — End: 2013-06-22
  Administered 2013-06-22: 30.2 via INTRAVENOUS

## 2013-06-22 NOTE — Procedures (Addendum)
[facility] [address] [place] [address] [phone]  Cardiology Nuclear Med Study  [name] [name] is a 68 y.o. male     MRN : [id number]     DOB: 1945-08-30  Procedure Date: 06/22/2013  Nuclear Med Background Indication for Stress Test:  Evaluate for Ischemia;Post Hospital;Abnormal EKG History:  Asthma, COPD and PAF;Last NUC on 06/07/2012-nonischemic;EF=64%;ECHO on 06/29/2007-EF=55-60% Cardiac Risk Factors: Family History - CAD, History of Smoking, Hypertension, Overweight and DVT's;PE  Symptoms:  Dizziness, DOE, Fatigue, Light-Headedness, Near Syncope and SOB   Nuclear Pre-Procedure Caffeine/Decaff Intake:  1:00am NPO After: 11am   IV Site: R Antecubital  IV 0.9% NS with Angio Cath:  22g  Chest Size (in):  50"  IV Started by: Rolene Course, RN  Height: 6\' 2"  (1.88 m)  Cup Size: n/a  BMI:  Body mass index is 35.93 kg/(m^2). Weight:  280 lb (127.007 kg)   Tech Comments: Test was changed to a Lexiscan due to patient not being able to walk on treadmill because of his extreme shortness of breath. He has a history of both asthma and COPD. Did not bring his albuterol inhaler. He requested to stop treadmill.    Nuclear Med Study 1 or 2 day study: 1 day  Stress Test Type:  [place]  Order Authorizing Provider:  Richardson Dopp, PAC   Resting Radionuclide: Technetium 44m Sestamibi  Resting Radionuclide Dose: 10.9 mCi   Stress Radionuclide:  Technetium 64m Sestamibi  Stress Radionuclide Dose: 30.2 mCi           Stress Protocol Rest HR: 69 Stress HR: 83  Rest BP: 144/92 Stress BP: 158/62  Exercise Time (min): n/a METS: n/a          Dose of Adenosine (mg):  n/a Dose of Lexiscan: 0.4 mg  Dose of Atropine (mg): n/a Dose of Dobutamine: n/a mcg/kg/min (at max HR)  Stress Test Technologist: Mellody Memos, CCT Nuclear Technologist: Imagene Riches, CNMT   Rest Procedure:  Myocardial perfusion imaging was performed at rest 45  minutes following the intravenous administration of Technetium 23m Sestamibi. Stress Procedure:  The patient received IV Lexiscan 0.4 mg over 15-seconds.  Technetium 24m Sestamibi injected at 30-seconds.  Patient experienced shortness of breath, dizziness and was administered 75 mg of Aminophylline IV at 5 minutes. There were no significant changes with Lexiscan.  Quantitative spect images were obtained after a 45 minute delay.  Transient Ischemic Dilatation (Normal <1.22):  0.91 Lung/Heart Ratio (Normal <0.45):  0.31 QGS EDV:  96 ml QGS ESV:  39 ml LV Ejection Fraction: 59%  Pam Hardin Negus, CNMT  PHYSICIAN INTERPRETATION  Rest ECG: NSR with non-specific ST-T wave changes and No acute changes  Stress ECG: No significant change from baseline ECG and No significant ST segment change suggestive of ischemia.  QPS Raw Data Images:  Patient motion noted. Stress Images:  There is decreased uptake in the septum. There is a small to medium sized, mild to moderate intensity mostly fixed perfusion defect int he mid to apical inferoseptal wall - with partial reversibility.  A mostly fixed defect with normal wall motion is most consistent with artifact/attenuation, bu cannot exclude prior infarct with mild peri-infarct ischemia. Rest Images:  There is decreased uptake in the septum.  Comparison with the stress images reveals no significant change - Mild reversibilty Subtraction (SDS):  There is a fixed inferior defect that is most consistent with diaphragmatic attenuation.  Mild reversibility was noted by computer enhancement,not  appreciated visually -- potentially consistent with prior infarct with peri-infarct ischemia.   LV Wall Motion:  NL LV Function; NL Wall Motion - arguing against prior infarction.  Impression Exercise Capacity:  Poor exercise capacity.  Patient asked to stop TM after ~3 min with HR 102 due to extreme dyspnea. Converted to [employer]. BP Response:  Normal blood pressure  response. Clinical Symptoms:  There is dyspnea. Both with TM & Lexiscan ECG Impression:  No significant ST segment change suggestive of ischemia. Comparison with Prior Nuclear Study: No images to compare  Overall Impression:  Low risk stress nuclear study with a small to medium sized mostly fixed defect in the mid to apical inferoseptal wall with normal wall motion.  Most consistent with gut attenuation (gastric air bubble).Reubin Milan exclude inferoseptal infarct with mild peri-infarct ischemia.  Clinical correlation is warranted.  Very poor exercise tolerance, but normal LVEF & wall motion.   Leonie Man, MD  06/22/2013 7:00 PM

## 2013-06-27 ENCOUNTER — Encounter: Payer: Self-pay | Admitting: Physician Assistant

## 2013-06-27 ENCOUNTER — Telehealth: Payer: Self-pay | Admitting: *Deleted

## 2013-06-27 DIAGNOSIS — R0600 Dyspnea, unspecified: Secondary | ICD-10-CM

## 2013-06-27 NOTE — Telephone Encounter (Signed)
lmptcb for myoview results and to go over recommendations as to referral to pulmonary for further eval of dyspnea.

## 2013-06-28 NOTE — Telephone Encounter (Signed)
s/w pt's wife today about myoview results, she verbalized understanding to results. She was made aware of recommendation from Dr. Percival Spanish and Brynda Rim. PA to refer to pulmonolgy for further eval of dyspnea. I stated will have scheduling call to make appt

## 2013-07-03 ENCOUNTER — Ambulatory Visit (INDEPENDENT_AMBULATORY_CARE_PROVIDER_SITE_OTHER): Payer: Medicare Other | Admitting: Physician Assistant

## 2013-07-03 ENCOUNTER — Ambulatory Visit: Payer: Medicare Other | Admitting: Cardiology

## 2013-07-03 ENCOUNTER — Encounter: Payer: Self-pay | Admitting: Physician Assistant

## 2013-07-03 VITALS — BP 119/80 | HR 74 | Ht 74.0 in | Wt 283.0 lb

## 2013-07-03 DIAGNOSIS — I10 Essential (primary) hypertension: Secondary | ICD-10-CM | POA: Diagnosis not present

## 2013-07-03 DIAGNOSIS — I4891 Unspecified atrial fibrillation: Secondary | ICD-10-CM | POA: Diagnosis not present

## 2013-07-03 DIAGNOSIS — R0602 Shortness of breath: Secondary | ICD-10-CM | POA: Diagnosis not present

## 2013-07-03 MED ORDER — METOPROLOL SUCCINATE ER 25 MG PO TB24: 25.0000 mg | ORAL_TABLET | Freq: Every day | ORAL | Status: DC

## 2013-07-03 NOTE — Progress Notes (Signed)
[name] [name], [address] [place], [place]  [address] Phone: [phone] Fax:  [phone]  Date:  07/03/2013   ID:  [name] [name], DOB [birth year], MRN [id number]  PCP:  [name], MD  Cardiologist:  Dr. Minus Breeding    History of Present Illness: [name] [name] is a 68 y.o. male with a history of paroxysmal atrial fibrillation, HTN, prior DVT and pulmonary emboli following back surgery in 2010, COPD. Patient was seen by Dr. Percival Spanish last month with complaints of dizziness. Patient had no significant blood pressure drop with standing. With ambulation, his oxygen remained in the 90s but his heart rate never increased above 76. He was in normal sinus rhythm. Dr. Percival Spanish stopped his Cardizem and beta blocker. He was placed on the 24-hour Holter monitor that demonstrated NSR with PAC/PVCs.  I saw him in f/u 06/16/13.  BNP was normal. Chest x-ray was unremarkable.  I arranged a stress test.  He had poor exercise tolerance due to dyspnea and was changed to a Lexiscan.  Images were low risk.  I reviewed this with Dr. Percival Spanish and we have referred him back to pulmonology.  He returns for follow up. He continues to have the same symptoms.  He notes dyspnea with activity and associated dizziness. He denies syncope. He denies chest pain. He denies orthopnea or PND. He denies LE edema.  Studies:  - Echo (06/29/07):  EF 55-60%, no RWMA, mild LVH, mild focal basal septal hypertrophy, mild ascending Ao dilatation (41 mm), mild MR, mild to mod LAE.    - Nuclear (06/07/12):  No ischemia, EF 64%, NORMAL.  Carlton Adam Myoview (06/2013): Diaphragmatic attenuation (cannot rule out anteroseptal infarct with mild peri-infarct ischemia), normal wall motion, EF 59%; low risk study  Recent Labs: 04/04/2013: HDL Cholesterol 41.80; LDL (calc) 110*; TSH 3.36  04/19/2013: ALT 24; Hemoglobin 12.8*  06/16/2013: Creatinine 1.2; Potassium 4.7; Pro B Natriuretic peptide (BNP) 38.0   Wt Readings from Last 3 Encounters:    07/03/13 283 lb (128.368 kg)  06/22/13 280 lb (127.007 kg)  06/16/13 280 lb (127.007 kg)     Past Medical History  Diagnosis Date  . BPH (benign prostatic hypertrophy)   . Spinal stenosis     congential  . Hypertension   . Asthma   . History of blood clots     in legs and lungs following back surgery in 2010  . Allergic rhinitis   . Atrial fibrillation   . 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 Cataract     REMOVED  . Clotting disorder     BLOOD CLOTS IN LEGS/LUNGS -2010  . COPD (chronic obstructive pulmonary disease)   . GERD (gastroesophageal reflux disease)   . Diverticulosis   . Hx of cardiovascular stress test     Lexiscan Myoview (06/2013): Diaphragmatic attenuation (cannot rule out anteroseptal infarct with mild peri-infarct ischemia), normal wall motion, EF 59%; low risk study    Current Outpatient Prescriptions  Medication Sig Dispense Refill  . albuterol (PROAIR HFA) 108 (90 BASE) MCG/ACT inhaler Inhale 2 puffs into the lungs every 6 (six) hours as needed for wheezing or shortness of breath.      [name] amLODipine (NORVASC) 5 MG tablet Take 1 tablet (5 mg total) by mouth daily.  180 tablet  3  . budesonide-formoterol (SYMBICORT) 160-4.5 MCG/ACT inhaler Inhale 2 puffs into the lungs 2 (two) times daily.  1 Inhaler  3  . cyclobenzaprine (FLEXERIL) 10 MG tablet Take 1 tablet (10 mg total)  by mouth 2 (two) times daily as needed for muscle spasms.  20 tablet  0  . diclofenac (VOLTAREN) 75 MG EC tablet Take 1 tablet by mouth 2 (two) times daily.      . flecainide (TAMBOCOR) 150 MG tablet Take 1 tablet (150 mg total) by mouth 2 (two) times daily.  180 tablet  3  . Ipratropium-Albuterol (COMBIVENT RESPIMAT) 20-100 MCG/ACT AERS respimat Inhale 1 puff into the lungs every 6 (six) hours.  4 g  5  . LORazepam (ATIVAN) 0.5 MG tablet Take 1 tablet (0.5 mg total) by mouth at bedtime as needed for anxiety or sleep.  30 tablet  0  . omeprazole (PRILOSEC) 20 MG capsule Take 20 mg by mouth at  bedtime.      [name] PRESCRIPTION MEDICATION Take 2 tablets by mouth once.      . sertraline (ZOLOFT) 50 MG tablet Take 50 mg by mouth at bedtime.      [name] warfarin (COUMADIN) 3 MG tablet Take 2 tabs (6mg ) on Tues, Thurs, Sun. And take 1&1/2 tabs (4.5mg ) all other days.       No current facility-administered medications for this visit.    Allergies:   Penicillins   Social History:  The patient  reports that he quit smoking about 13 years ago. His smoking use included Cigarettes. He has a 80 pack-year smoking history. He has never used smokeless tobacco. He reports that he drinks alcohol. He reports that he does not use illicit drugs.   Family History:  The patient's family history includes Alcohol abuse (age of onset: 50) in his brother; Atrial fibrillation in his mother; COPD in his mother; Colon cancer in his father; Heart attack (age of onset: 38) in his brother; Heart disease in his father.   ROS:  Please see the history of present illness.   He does note a history of wheezing.   All other systems reviewed and negative.   PHYSICAL EXAM: VS:  BP 119/80  Pulse 74  Ht 6\' 2"  (1.88 m)  Wt 283 lb (128.368 kg)  BMI 36.32 kg/m2 Well nourished, well developed, in no acute distress HEENT: normal Neck: no JVD Cardiac:  distant S1, S2; RRR; no murmur Lungs:  Decreased breath sounds with mild inspiratory and expiratory wheezing throughout Abd: soft, nontender, no hepatomegaly Ext: no edema Skin: warm and dry Neuro:  CNs 2-12 intact, no focal abnormalities noted  EKG:   NSR, HR 74, first degree AV block with PR interval 226 ms, normal axis, intraventricular conduction delay, poor R wave progression, no change from prior tracing   ASSESSMENT AND PLAN:  1. Dyspnea:  Likely related to COPD. Cardiac workup has thus far been negative. Myoview was low risk. BNP was normal. Chest x-ray demonstrated no significant change. I will arrange an echocardiogram to further assess right-sided heart pressures. He  has an appointment with pulmonology pending. 2. Atrial Fibrillation: He is maintaining NSR. He remains on flecainide and Coumadin. Resume Toprol-XL 25 mg daily. 3. Hypertension:  Much improved on amlodipine 5 mg daily. 4. Disposition: Follow up with Dr. Percival Spanish in September as planned.  Signed, Richardson Dopp, PA-C  07/03/2013 11:55 AM

## 2013-07-03 NOTE — Patient Instructions (Signed)
Your physician has requested that you have an echocardiogram 07/26/13 @ 1 PM DX SOB AND TO R/O PULMONARY HTN. Echocardiography is a painless test that uses sound waves to create images of your heart. It provides your doctor with information about the size and shape of your heart and how well your heart's chambers and valves are working. This procedure takes approximately one hour. There are no restrictions for this procedure.   CV[facility] APPT 07/26/13 @ 12:30  RE-START TOPROL XL 25 MG DAILY; RX SENT IN  WILL SEND OUT A REMINDER LETTER FOR YOU TO  FOLLOW UP WITH DR. HOCHREIN IN 11/2013

## 2013-07-05 ENCOUNTER — Encounter: Payer: Self-pay | Admitting: Pulmonary Disease

## 2013-07-05 ENCOUNTER — Ambulatory Visit (HOSPITAL_COMMUNITY)
Admission: RE | Admit: 2013-07-05 | Discharge: 2013-07-05 | Disposition: A | Discharge status: Home / Self Care | Payer: Medicare Other | Source: Ambulatory Visit | Attending: Cardiovascular Disease | Admitting: Cardiovascular Disease

## 2013-07-05 ENCOUNTER — Ambulatory Visit (INDEPENDENT_AMBULATORY_CARE_PROVIDER_SITE_OTHER): Payer: Medicare Other | Admitting: Pulmonary Disease

## 2013-07-05 VITALS — BP 122/80 | HR 57 | Temp 96.9°F | Ht 74.0 in | Wt 284.8 lb

## 2013-07-05 DIAGNOSIS — R0609 Other forms of dyspnea: Secondary | ICD-10-CM

## 2013-07-05 DIAGNOSIS — R0989 Other specified symptoms and signs involving the circulatory and respiratory systems: Secondary | ICD-10-CM | POA: Diagnosis not present

## 2013-07-05 DIAGNOSIS — R0602 Shortness of breath: Secondary | ICD-10-CM | POA: Insufficient documentation

## 2013-07-05 NOTE — Assessment & Plan Note (Signed)
The patient gives a history for progressive dyspnea on exertion, to the point that he cannot walk more than a few feet without getting winded. He carries a diagnosis of "COPD", but I have never seen PFTs to demonstrate this. I scheduled him for him at my last visit 2 years ago, and the patient never showed. He is currently on Symbicort which is an excellent bronchodilator, but tells me that none of the inhaled medications have made a difference in his breathing. This tells me that more than likely his symptoms are not coming from COPD. The other possibility is that his COPD is so significant the medications are not helping him.  I would find this very unlikely. The patient is also on chronic Coumadin, making chronic thromboembolic disease very unlikely.  Will schedule for full PFTs, however the patient states he cannot do these until next month because he is going out-of-town to visit his grandchildren. In the meantime, I've asked him to work on weight loss and conditioning. If his PFTs are unremarkable, I would consider doing a cardiopulmonary exercise test to try and find the etiology of his dyspnea.

## 2013-07-05 NOTE — Progress Notes (Signed)
2D Echocardiogram Complete.  07/05/2013   Kristopher Attwood, RDCS 

## 2013-07-05 NOTE — Progress Notes (Signed)
   Subjective:    Patient ID: [name] [name], male    DOB: 1945-04-24, 68 y.o.   MRN: [id number]  HPI The patient comes in today for an acute sick visit. He was last seen 2 years ago for worsening dyspnea on exertion, where Spiriva was added to his regimen and he was scheduled for pulmonary function studies. The patient never showed for his breathing studies, and I have not seen him since that time. He is unsure if the Spiriva helped him or not, but tells me that he has been on all different types of inhalers that do not seem to make a difference.  The patient again notes severe dyspnea on exertion, even after just walking a few feet. He has been compliant with his Symbicort, and has had a recent chest x-ray that is unremarkable. He has had a stress test that was read as low risk, and had an echocardiogram today that is pending. He denies any significant cough or mucus. He has remained on Coumadin, and therefore thromboembolic disease is very unlikely.   Review of Systems  Constitutional: Negative for fever and unexpected weight change.  HENT: Positive for nosebleeds and rhinorrhea. Negative for congestion, dental problem, ear pain, postnasal drip, sinus pressure, sneezing, sore throat and trouble swallowing.   Eyes: Negative for redness and itching.  Respiratory: Positive for cough and shortness of breath. Negative for chest tightness and wheezing.   Cardiovascular: Positive for palpitations. Negative for leg swelling.  Gastrointestinal: Negative for nausea and vomiting.  Genitourinary: Negative for dysuria.  Musculoskeletal: Negative for joint swelling.  Skin: Negative for rash.  Neurological: Negative for headaches.  Hematological: Does not bruise/bleed easily.  Psychiatric/Behavioral: Negative for dysphoric mood. The patient is not nervous/anxious.        Objective:   Physical Exam Obese male in no acute distress Nose without purulence or discharge noted Neck without lymphadenopathy  or thyromegaly Chest totally clear to auscultation, mild upper airway pseudo wheezing. Cardiac exam with regular rate and rhythm but distant Lower extremities with minimal ankle edema, no cyanosis Alert and oriented, moves all 4 extremities       Assessment & Plan:

## 2013-07-05 NOTE — Patient Instructions (Signed)
Stay on symbicort 2 puffs in am and pm everyday.  Rinse your mouth out after using.  Can use combivent one inhalation every 6hrs only for emergencies.  Do not use regularly. Work on weight loss and conditioning. Will schedule for breathing studies based on your schedule, and will call when results are available.

## 2013-07-06 ENCOUNTER — Telehealth: Payer: Self-pay | Admitting: *Deleted

## 2013-07-06 NOTE — Telephone Encounter (Signed)
s/w both pt and his wife about echo results with verbal understanding today.

## 2013-07-12 DIAGNOSIS — R42 Dizziness and giddiness: Secondary | ICD-10-CM | POA: Diagnosis not present

## 2013-07-12 DIAGNOSIS — R0609 Other forms of dyspnea: Secondary | ICD-10-CM | POA: Diagnosis not present

## 2013-07-12 DIAGNOSIS — E669 Obesity, unspecified: Secondary | ICD-10-CM | POA: Diagnosis not present

## 2013-07-12 DIAGNOSIS — J449 Chronic obstructive pulmonary disease, unspecified: Secondary | ICD-10-CM | POA: Diagnosis not present

## 2013-07-25 ENCOUNTER — Ambulatory Visit (HOSPITAL_COMMUNITY)
Admission: RE | Admit: 2013-07-25 | Discharge: 2013-07-25 | Disposition: A | Discharge status: Home / Self Care | Payer: Medicare Other | Source: Ambulatory Visit | Attending: Pulmonary Disease | Admitting: Pulmonary Disease

## 2013-07-25 DIAGNOSIS — R0989 Other specified symptoms and signs involving the circulatory and respiratory systems: Secondary | ICD-10-CM | POA: Diagnosis not present

## 2013-07-25 DIAGNOSIS — R059 Cough, unspecified: Secondary | ICD-10-CM | POA: Insufficient documentation

## 2013-07-25 DIAGNOSIS — R05 Cough: Secondary | ICD-10-CM | POA: Insufficient documentation

## 2013-07-25 DIAGNOSIS — J988 Other specified respiratory disorders: Secondary | ICD-10-CM | POA: Insufficient documentation

## 2013-07-25 DIAGNOSIS — Z87891 Personal history of nicotine dependence: Secondary | ICD-10-CM | POA: Insufficient documentation

## 2013-07-25 DIAGNOSIS — R062 Wheezing: Secondary | ICD-10-CM | POA: Diagnosis not present

## 2013-07-25 DIAGNOSIS — R0609 Other forms of dyspnea: Secondary | ICD-10-CM | POA: Diagnosis not present

## 2013-07-25 MED ORDER — ALBUTEROL SULFATE (2.5 MG/3ML) 0.083% IN NEBU
2.5000 mg | INHALATION_SOLUTION | Freq: Once | RESPIRATORY_TRACT | Status: AC
  Administered 2013-07-25: 2.5 mg via RESPIRATORY_TRACT

## 2013-07-26 ENCOUNTER — Telehealth: Payer: Self-pay | Admitting: Pulmonary Disease

## 2013-07-26 ENCOUNTER — Ambulatory Visit: Payer: Medicare Other | Admitting: Internal Medicine

## 2013-07-26 ENCOUNTER — Other Ambulatory Visit (HOSPITAL_COMMUNITY): Payer: Medicare Other

## 2013-07-26 DIAGNOSIS — R0609 Other forms of dyspnea: Principal | ICD-10-CM

## 2013-07-26 NOTE — Telephone Encounter (Signed)
Pt states he is returning call to [facility]

## 2013-07-26 NOTE — Telephone Encounter (Signed)
Called pt and no one answered phone.  LMOM PFT"s show mild airflow obstruction, no restriction, and a severe decrease in DLCO  Jennifer, if you can call him and let him know that his breathing studies showed very mild copd.  Therefore this is not the cause of his severe sob.  It also explains why he did not see a big difference with all of the different inhalers.  I would like for him to stay on symbicort. Let him know that I would like to check a lung scan to make sure no issue with blood clots in the lungs, but not likely given he is on blood thinners.  He may need further cardiac evaluation as well, and will send this note to them as well. Ok to put in order for V/Q.  I will call him tues when I get back in town.

## 2013-07-26 NOTE — Telephone Encounter (Signed)
Pt had PFT 07/25/13. Pt requesting results. Please advise [place] thanks

## 2013-07-26 NOTE — Telephone Encounter (Signed)
Spoke with the pt and notified of recs per [facility]  She verbalized understanding  Order for VQ scan was placed

## 2013-07-27 ENCOUNTER — Telehealth: Payer: Self-pay | Admitting: Pulmonary Disease

## 2013-07-27 LAB — PULMONARY FUNCTION TEST
DL/VA % pred: 56 %
DL/VA: 2.74 ml/min/mmHg/L
DLCO UNC % PRED: 46 %
DLCO unc: 17.51 ml/min/mmHg
FEF 25-75 PRE: 1.88 L/s
FEF 25-75 Post: 2.04 L/sec
FEF2575-%Change-Post: 8 %
FEF2575-%PRED-POST: 70 %
FEF2575-%Pred-Pre: 65 %
FEV1-%CHANGE-POST: 5 %
FEV1-%PRED-PRE: 73 %
FEV1-%Pred-Post: 77 %
FEV1-POST: 2.94 L
FEV1-PRE: 2.78 L
FEV1FVC-%CHANGE-POST: 1 %
FEV1FVC-%PRED-PRE: 92 %
FEV6-%Change-Post: 6 %
FEV6-%PRED-POST: 85 %
FEV6-%Pred-Pre: 80 %
FEV6-Post: 4.18 L
FEV6-Pre: 3.93 L
FEV6FVC-%Change-Post: 0 %
FEV6FVC-%PRED-PRE: 104 %
FEV6FVC-%Pred-Post: 103 %
FVC-%Change-Post: 4 %
FVC-%Pred-Post: 82 %
FVC-%Pred-Pre: 79 %
FVC-POST: 4.26 L
FVC-Pre: 4.08 L
POST FEV1/FVC RATIO: 69 %
Post FEV6/FVC ratio: 99 %
Pre FEV1/FVC ratio: 68 %
Pre FEV6/FVC Ratio: 99 %
RV % pred: 94 %
RV: 2.51 L
TLC % pred: 81 %
TLC: 6.38 L

## 2013-07-27 NOTE — Telephone Encounter (Signed)
Spoke with the pt's spouse  She understands that [facility] wants to order VQ scan, but she does not know why he chose this rather than ct chest  I advised that [facility] is out of the office until 5/26  She verbalized understanding  Please advise, thanks!

## 2013-07-28 ENCOUNTER — Telehealth: Payer: Self-pay | Admitting: *Deleted

## 2013-07-28 ENCOUNTER — Telehealth: Payer: Self-pay | Admitting: Physician Assistant

## 2013-07-28 DIAGNOSIS — R0600 Dyspnea, unspecified: Secondary | ICD-10-CM

## 2013-07-28 DIAGNOSIS — Z01810 Encounter for preprocedural cardiovascular examination: Secondary | ICD-10-CM

## 2013-07-28 DIAGNOSIS — Z7901 Long term (current) use of anticoagulants: Secondary | ICD-10-CM

## 2013-07-28 NOTE — Telephone Encounter (Signed)
I spoke with the pt spouse and explained why Dr. Gwenette Greet ordered the VQ scan. I answered all of her questions. She states she had a better understanding and felt better about the plan. Crestline Bing, CMA

## 2013-07-28 NOTE — Telephone Encounter (Signed)
Left message for pt to call back to discuss being scheduled for a right and left cardiac cath per Dr Percival Spanish for dyspnea

## 2013-07-28 NOTE — Telephone Encounter (Signed)
See notes from Dr. Gwenette Greet. Please arrange f/u with Dr. Minus Breeding in the next 2-3 weeks for follow up on dyspnea. Thanks. Richardson Dopp, PA-C   07/28/2013 1:26 PM

## 2013-07-28 NOTE — Telephone Encounter (Signed)
[id number] to discuss this further with pt spouse. Advised her to ask for [facility].   Kathee Delton, MD at 07/26/2013 1:22 PM     Status: Signed        Called pt and no one answered phone. LMOM  PFT"s show mild airflow obstruction, no restriction, and a severe decrease in DLCO  Rashana Andrew, if you can call him and let him know that his breathing studies showed very mild copd. Therefore this is not the cause of his severe sob. It also explains why he did not see a big difference with all of the different inhalers. I would like for him to stay on symbicort.  Let him know that I would like to check a lung scan to make sure no issue with blood clots in the lungs, but not likely given he is on blood thinners. He may need further cardiac evaluation as well, and will send this note to them as well.  Ok to put in order for V/Q. I will call him tues when I get back in town.

## 2013-07-28 NOTE — Telephone Encounter (Signed)
Error

## 2013-07-28 NOTE — Telephone Encounter (Signed)
I spoke with Dr. Minus Breeding. He recommends right and left heart cath. Please arrange with Dr. Minus Breeding. We will need to arrange Lovenox bridging with the coumadin clinic b/c of his history of pulmonary embolism.  I discussed the need for heart catheterization with the patient today.  Risks and benefits of cardiac catheterization have been discussed with the patient.  These include bleeding, infection, kidney damage, stroke, heart attack, death.  The patient understands these risks and is willing to proceed.  Please arrange R/L heart cath and Lovenox bridge with [name], PA-C   07/28/2013 1:38 PM

## 2013-07-30 ENCOUNTER — Other Ambulatory Visit: Payer: Self-pay | Admitting: Cardiology

## 2013-08-01 ENCOUNTER — Encounter: Payer: Self-pay | Admitting: *Deleted

## 2013-08-01 ENCOUNTER — Telehealth: Payer: Self-pay | Admitting: *Deleted

## 2013-08-01 ENCOUNTER — Ambulatory Visit (INDEPENDENT_AMBULATORY_CARE_PROVIDER_SITE_OTHER): Payer: Medicare Other | Admitting: *Deleted

## 2013-08-01 DIAGNOSIS — I4891 Unspecified atrial fibrillation: Secondary | ICD-10-CM | POA: Diagnosis not present

## 2013-08-01 DIAGNOSIS — Z86718 Personal history of other venous thrombosis and embolism: Secondary | ICD-10-CM

## 2013-08-01 LAB — POCT INR: INR: 2.3

## 2013-08-01 NOTE — Telephone Encounter (Signed)
Patient is returning your call to get Cath scheduled. Please call and advise.

## 2013-08-01 NOTE — Telephone Encounter (Signed)
Pt is scheduled for right and left heart cath by Dr Martinique 08/07/2013  He is to be there at 8:30 for a 10:30 case.  He will come for lab work prior to then.  Instruction sheet will be left at the front desk for pick up.

## 2013-08-01 NOTE — Telephone Encounter (Addendum)
Patient's wife called because she wanted to confirm that Dr.Jordan would be performing the patient's heart cath on Monday and wanted to review the instructions. Advised NPO after midnight and arrive at the [facility]Short Stay on 6/1 830 for 1030 am and lab work tomorrow ( that [employer] had already instructed.) Looking at med list he is on Coumadin and the notes from [place] advised that he needs to be bridged with Lovenox. Spoke with Dahlia Byes D and he advised last dose of Coumadin tonight and then to see [facility] at 1040am tomorrow for further instructions. Patient and his wife are aware of above. Advised other meds OK to take with a sip of water prior to heart cath.

## 2013-08-01 NOTE — Telephone Encounter (Signed)
Spoke with pt who is aware of cath date and that instructions will be at the front desk.  He will have blood work tomorrow here in the office.  He will call back with any questions or concerns before his cath.

## 2013-08-02 ENCOUNTER — Ambulatory Visit (INDEPENDENT_AMBULATORY_CARE_PROVIDER_SITE_OTHER): Payer: Medicare Other | Admitting: *Deleted

## 2013-08-02 ENCOUNTER — Ambulatory Visit (HOSPITAL_COMMUNITY)
Admission: RE | Admit: 2013-08-02 | Discharge: 2013-08-02 | Disposition: A | Discharge status: Home / Self Care | Payer: Medicare Other | Source: Ambulatory Visit | Attending: Pulmonary Disease | Admitting: Pulmonary Disease

## 2013-08-02 DIAGNOSIS — Z7901 Long term (current) use of anticoagulants: Secondary | ICD-10-CM

## 2013-08-02 DIAGNOSIS — J984 Other disorders of lung: Secondary | ICD-10-CM | POA: Diagnosis not present

## 2013-08-02 DIAGNOSIS — R0609 Other forms of dyspnea: Principal | ICD-10-CM

## 2013-08-02 DIAGNOSIS — R0602 Shortness of breath: Secondary | ICD-10-CM | POA: Diagnosis not present

## 2013-08-02 DIAGNOSIS — I4891 Unspecified atrial fibrillation: Secondary | ICD-10-CM

## 2013-08-02 DIAGNOSIS — R0989 Other specified symptoms and signs involving the circulatory and respiratory systems: Principal | ICD-10-CM | POA: Insufficient documentation

## 2013-08-02 DIAGNOSIS — Z86718 Personal history of other venous thrombosis and embolism: Secondary | ICD-10-CM

## 2013-08-02 DIAGNOSIS — Z01810 Encounter for preprocedural cardiovascular examination: Secondary | ICD-10-CM | POA: Diagnosis not present

## 2013-08-02 DIAGNOSIS — J438 Other emphysema: Secondary | ICD-10-CM | POA: Diagnosis not present

## 2013-08-02 DIAGNOSIS — R0600 Dyspnea, unspecified: Secondary | ICD-10-CM

## 2013-08-02 LAB — CBC
HCT: 36.5 % — ABNORMAL LOW (ref 39.0–52.0)
HEMOGLOBIN: 12.3 g/dL — AB (ref 13.0–17.0)
MCHC: 33.7 g/dL (ref 30.0–36.0)
MCV: 102.4 fl — ABNORMAL HIGH (ref 78.0–100.0)
PLATELETS: 408 10*3/uL — AB (ref 150.0–400.0)
RBC: 3.57 Mil/uL — AB (ref 4.22–5.81)
RDW: 15.8 % — ABNORMAL HIGH (ref 11.5–15.5)
WBC: 9.9 10*3/uL (ref 4.0–10.5)

## 2013-08-02 LAB — BASIC METABOLIC PANEL
BUN: 14 mg/dL (ref 6–23)
CALCIUM: 9 mg/dL (ref 8.4–10.5)
CO2: 24 mEq/L (ref 19–32)
Chloride: 106 mEq/L (ref 96–112)
Creatinine, Ser: 1.1 mg/dL (ref 0.4–1.5)
GFR: 71.97 mL/min (ref >60.00)
GLUCOSE: 92 mg/dL (ref 70–99)
POTASSIUM: 4.5 meq/L (ref 3.5–5.1)
SODIUM: 138 meq/L (ref 135–145)

## 2013-08-02 LAB — PROTIME-INR
INR: 2.3 ratio — ABNORMAL HIGH (ref 0.8–1.0)
PROTHROMBIN TIME: 24.9 s — AB (ref 9.6–13.1)

## 2013-08-02 LAB — POCT INR: INR: 2.3

## 2013-08-02 MED ORDER — ENOXAPARIN SODIUM 120 MG/0.8ML ~~LOC~~ SOLN: 120.0000 mg | Freq: Two times a day (BID) | SUBCUTANEOUS | Status: DC

## 2013-08-02 MED ORDER — TECHNETIUM TO 99M ALBUMIN AGGREGATED
5.8000 | Freq: Once | INTRAVENOUS | Status: AC | PRN
  Administered 2013-08-02: 6 via INTRAVENOUS

## 2013-08-02 MED ORDER — TECHNETIUM TC 99M DIETHYLENETRIAME-PENTAACETIC ACID: 45.0000 | Freq: Once | INTRAVENOUS | Status: AC | PRN

## 2013-08-02 NOTE — Patient Instructions (Addendum)
08/01/2013 last day to take coumadin 08/02/2013 no coumadin no Lovenox 08/03/2013 no coumadin Lovenox 120mg  8am and lovenox 120 mg 8pm 08/04/2013 no coumadin Lovenox 120 mg 8am and Lovenox 120mg  8pm 08/05/2013 no coumadin Lovenox 120mg  8am and Lovenox 120mg  8pm 08/06/2013 no coumadin Lovenox 120mg  8am only 08/07/2013 no coumadin no Lovenox day of procedure  After procedure when instructed by  Doctor doing procedure to restart coumadin and Lovenox restart Lovenox 120mg  8am and Lovenox 8pm and continue until seen in clinic June4th and also restart coumadin at same dose 4.5mg  daily except 6mg  on Sunday Tuesday and Thursday  except for first 2 days take extra 1/2 tablet of coumadin

## 2013-08-04 ENCOUNTER — Telehealth: Payer: Self-pay | Admitting: Cardiology

## 2013-08-04 NOTE — Telephone Encounter (Signed)
New message         Pt wife is calling to see if it is normal for pt to feel weak?

## 2013-08-04 NOTE — Telephone Encounter (Signed)
Wife calling because pt has weakness and feels "washed out"  This comes and goes and is really no different than before.  Wife is very anxious and feels helpless.  Advised since there really is no change in his s/s pt should continue to take it easy over the weekend.  If his s/s change over the weekend she can always call the Dr on call.  She states understanding and thanked me for calling her back.   Pt is scheduled for a cardiac cath on Monday.

## 2013-08-07 ENCOUNTER — Other Ambulatory Visit: Payer: Self-pay

## 2013-08-07 ENCOUNTER — Encounter (HOSPITAL_COMMUNITY): Payer: Self-pay | Admitting: Nurse Practitioner

## 2013-08-07 ENCOUNTER — Other Ambulatory Visit: Payer: Self-pay | Admitting: Cardiology

## 2013-08-07 ENCOUNTER — Encounter (HOSPITAL_COMMUNITY)
Admission: RE | Disposition: A | Discharge status: Home / Self Care | Payer: Medicare Other | Source: Ambulatory Visit | Attending: Cardiology

## 2013-08-07 ENCOUNTER — Ambulatory Visit (HOSPITAL_COMMUNITY)
Admission: RE | Admit: 2013-08-07 | Discharge: 2013-08-08 | Disposition: A | Discharge status: Home / Self Care | Payer: Medicare Other | Source: Ambulatory Visit | Attending: Cardiology | Admitting: Cardiology

## 2013-08-07 DIAGNOSIS — I10 Essential (primary) hypertension: Secondary | ICD-10-CM | POA: Diagnosis not present

## 2013-08-07 DIAGNOSIS — R0600 Dyspnea, unspecified: Secondary | ICD-10-CM

## 2013-08-07 DIAGNOSIS — R0609 Other forms of dyspnea: Secondary | ICD-10-CM | POA: Insufficient documentation

## 2013-08-07 DIAGNOSIS — Z86718 Personal history of other venous thrombosis and embolism: Secondary | ICD-10-CM | POA: Insufficient documentation

## 2013-08-07 DIAGNOSIS — I2510 Atherosclerotic heart disease of native coronary artery without angina pectoris: Secondary | ICD-10-CM | POA: Insufficient documentation

## 2013-08-07 DIAGNOSIS — I480 Paroxysmal atrial fibrillation: Secondary | ICD-10-CM

## 2013-08-07 DIAGNOSIS — Z7901 Long term (current) use of anticoagulants: Secondary | ICD-10-CM

## 2013-08-07 DIAGNOSIS — Z6835 Body mass index (BMI) 35.0-35.9, adult: Secondary | ICD-10-CM | POA: Diagnosis not present

## 2013-08-07 DIAGNOSIS — J449 Chronic obstructive pulmonary disease, unspecified: Secondary | ICD-10-CM

## 2013-08-07 DIAGNOSIS — K219 Gastro-esophageal reflux disease without esophagitis: Secondary | ICD-10-CM | POA: Insufficient documentation

## 2013-08-07 DIAGNOSIS — E669 Obesity, unspecified: Secondary | ICD-10-CM | POA: Diagnosis not present

## 2013-08-07 DIAGNOSIS — Z96649 Presence of unspecified artificial hip joint: Secondary | ICD-10-CM | POA: Insufficient documentation

## 2013-08-07 DIAGNOSIS — Z86711 Personal history of pulmonary embolism: Secondary | ICD-10-CM | POA: Diagnosis not present

## 2013-08-07 DIAGNOSIS — Z87891 Personal history of nicotine dependence: Secondary | ICD-10-CM | POA: Insufficient documentation

## 2013-08-07 DIAGNOSIS — D539 Nutritional anemia, unspecified: Secondary | ICD-10-CM | POA: Diagnosis not present

## 2013-08-07 DIAGNOSIS — N400 Enlarged prostate without lower urinary tract symptoms: Secondary | ICD-10-CM | POA: Insufficient documentation

## 2013-08-07 DIAGNOSIS — K5730 Diverticulosis of large intestine without perforation or abscess without bleeding: Secondary | ICD-10-CM | POA: Insufficient documentation

## 2013-08-07 DIAGNOSIS — Z880 Allergy status to penicillin: Secondary | ICD-10-CM | POA: Diagnosis not present

## 2013-08-07 DIAGNOSIS — Z955 Presence of coronary angioplasty implant and graft: Secondary | ICD-10-CM

## 2013-08-07 DIAGNOSIS — I4891 Unspecified atrial fibrillation: Secondary | ICD-10-CM | POA: Diagnosis not present

## 2013-08-07 DIAGNOSIS — J4489 Other specified chronic obstructive pulmonary disease: Secondary | ICD-10-CM | POA: Insufficient documentation

## 2013-08-07 DIAGNOSIS — R0989 Other specified symptoms and signs involving the circulatory and respiratory systems: Principal | ICD-10-CM | POA: Insufficient documentation

## 2013-08-07 HISTORY — DX: Other pulmonary embolism without acute cor pulmonale: I26.99

## 2013-08-07 HISTORY — DX: Acute embolism and thrombosis of unspecified deep veins of unspecified lower extremity: I82.409

## 2013-08-07 HISTORY — DX: Nutritional anemia, unspecified: D53.9

## 2013-08-07 HISTORY — PX: PERCUTANEOUS CORONARY STENT INTERVENTION (PCI-S): SHX5485

## 2013-08-07 HISTORY — DX: Paroxysmal atrial fibrillation: I48.0

## 2013-08-07 HISTORY — DX: Obesity, unspecified: E66.9

## 2013-08-07 HISTORY — PX: LEFT AND RIGHT HEART CATHETERIZATION WITH CORONARY ANGIOGRAM: SHX5449

## 2013-08-07 HISTORY — DX: Dyspnea, unspecified: R06.00

## 2013-08-07 HISTORY — DX: Atherosclerotic heart disease of native coronary artery without angina pectoris: I25.10

## 2013-08-07 HISTORY — DX: Elevation of levels of liver transaminase levels: R74.01

## 2013-08-07 HISTORY — DX: Nonspecific elevation of levels of transaminase and lactic acid dehydrogenase (ldh): R74.0

## 2013-08-07 HISTORY — DX: Other forms of dyspnea: R06.09

## 2013-08-07 LAB — POCT I-STAT 3, VENOUS BLOOD GAS (G3P V)
Acid-base deficit: 1 mmol/L (ref 0.0–2.0)
Bicarbonate: 24.6 mEq/L — ABNORMAL HIGH (ref 20.0–24.0)
O2 Saturation: 62 %
PH VEN: 7.365 — AB (ref 7.250–7.300)
TCO2: 26 mmol/L (ref 0–100)
pCO2, Ven: 43 mmHg — ABNORMAL LOW (ref 45.0–50.0)
pO2, Ven: 33 mmHg (ref 30.0–45.0)

## 2013-08-07 LAB — POCT ACTIVATED CLOTTING TIME
ACTIVATED CLOTTING TIME: 193 s
Activated Clotting Time: 753 seconds
Activated Clotting Time: 177 seconds

## 2013-08-07 LAB — POCT I-STAT 3, ART BLOOD GAS (G3+)
Acid-base deficit: 1 mmol/L (ref 0.0–2.0)
Bicarbonate: 23.2 mEq/L (ref 20.0–24.0)
O2 Saturation: 91 %
PO2 ART: 61 mmHg — AB (ref 80.0–100.0)
TCO2: 24 mmol/L (ref 0–100)
pCO2 arterial: 36.8 mmHg (ref 35.0–45.0)
pH, Arterial: 7.407 (ref 7.350–7.450)

## 2013-08-07 LAB — PROTIME-INR
INR: 1.01 (ref 0.00–1.49)
PROTHROMBIN TIME: 13.1 s (ref 11.6–15.2)

## 2013-08-07 SURGERY — LEFT AND RIGHT HEART CATHETERIZATION WITH CORONARY ANGIOGRAM
Anesthesia: LOCAL

## 2013-08-07 MED ORDER — ASPIRIN 81 MG PO CHEW
81.0000 mg | CHEWABLE_TABLET | Freq: Every day | ORAL | Status: DC
  Filled 2013-08-07: qty 1

## 2013-08-07 MED ORDER — METOPROLOL SUCCINATE ER 25 MG PO TB24
25.0000 mg | ORAL_TABLET | Freq: Every day | ORAL | Status: DC
  Filled 2013-08-07 (×2): qty 1

## 2013-08-07 MED ORDER — SODIUM CHLORIDE 0.9 % IV SOLN
INTRAVENOUS | Status: DC
  Administered 2013-08-07: 09:00:00 via INTRAVENOUS

## 2013-08-07 MED ORDER — SERTRALINE HCL 50 MG PO TABS
50.0000 mg | ORAL_TABLET | Freq: Every day | ORAL | Status: DC
  Administered 2013-08-07: 50 mg via ORAL
  Filled 2013-08-07 (×2): qty 1

## 2013-08-07 MED ORDER — IPRATROPIUM-ALBUTEROL 0.5-2.5 (3) MG/3ML IN SOLN: 2.5000 mL | Freq: Four times a day (QID) | RESPIRATORY_TRACT | Status: DC | PRN

## 2013-08-07 MED ORDER — IPRATROPIUM-ALBUTEROL 0.5-2.5 (3) MG/3ML IN SOLN
2.5000 mL | Freq: Four times a day (QID) | RESPIRATORY_TRACT | Status: DC
  Administered 2013-08-08: 3 mL via RESPIRATORY_TRACT
  Filled 2013-08-07: qty 3

## 2013-08-07 MED ORDER — OMEPRAZOLE 20 MG PO CPDR
20.0000 mg | DELAYED_RELEASE_CAPSULE | Freq: Once | ORAL | Status: AC
  Administered 2013-08-08: 01:00:00 20 mg via ORAL
  Filled 2013-08-07: qty 1

## 2013-08-07 MED ORDER — SODIUM CHLORIDE 0.9 % IV SOLN: 250.0000 mL | INTRAVENOUS | Status: DC | PRN

## 2013-08-07 MED ORDER — AMLODIPINE BESYLATE 5 MG PO TABS
5.0000 mg | ORAL_TABLET | Freq: Every day | ORAL | Status: DC
  Administered 2013-08-07: 5 mg via ORAL
  Filled 2013-08-07 (×3): qty 1

## 2013-08-07 MED ORDER — LIDOCAINE HCL (PF) 1 % IJ SOLN
INTRAMUSCULAR | Status: AC
  Filled 2013-08-07: qty 30

## 2013-08-07 MED ORDER — HEPARIN SODIUM (PORCINE) 1000 UNIT/ML IJ SOLN
INTRAMUSCULAR | Status: AC
  Filled 2013-08-07: qty 1

## 2013-08-07 MED ORDER — SODIUM CHLORIDE 0.9 % IJ SOLN: 3.0000 mL | INTRAMUSCULAR | Status: DC | PRN

## 2013-08-07 MED ORDER — DICLOFENAC SODIUM 75 MG PO TBEC
75.0000 mg | DELAYED_RELEASE_TABLET | Freq: Two times a day (BID) | ORAL | Status: DC
  Administered 2013-08-07: 75 mg via ORAL
  Filled 2013-08-07 (×3): qty 1

## 2013-08-07 MED ORDER — CLOPIDOGREL BISULFATE 75 MG PO TABS
75.0000 mg | ORAL_TABLET | Freq: Every day | ORAL | Status: DC
Start: 2013-08-08 — End: 2013-08-08
  Filled 2013-08-07: qty 1

## 2013-08-07 MED ORDER — NITROGLYCERIN 0.2 MG/ML ON CALL CATH LAB
INTRAVENOUS | Status: AC
  Filled 2013-08-07: qty 1

## 2013-08-07 MED ORDER — PRASUGREL HCL 10 MG PO TABS
ORAL_TABLET | ORAL | Status: AC
  Filled 2013-08-07: qty 6

## 2013-08-07 MED ORDER — FLECAINIDE ACETATE 100 MG PO TABS
150.0000 mg | ORAL_TABLET | Freq: Two times a day (BID) | ORAL | Status: DC
  Administered 2013-08-07: 21:00:00 150 mg via ORAL
  Filled 2013-08-07 (×3): qty 1

## 2013-08-07 MED ORDER — ASPIRIN 81 MG PO CHEW
81.0000 mg | CHEWABLE_TABLET | ORAL | Status: AC
  Administered 2013-08-07: 81 mg via ORAL
  Filled 2013-08-07: qty 1

## 2013-08-07 MED ORDER — HEPARIN (PORCINE) IN NACL 2-0.9 UNIT/ML-% IJ SOLN
INTRAMUSCULAR | Status: AC
  Filled 2013-08-07: qty 1500

## 2013-08-07 MED ORDER — SODIUM CHLORIDE 0.9 % IJ SOLN: 3.0000 mL | Freq: Two times a day (BID) | INTRAMUSCULAR | Status: DC

## 2013-08-07 MED ORDER — MIDAZOLAM HCL 2 MG/2ML IJ SOLN
INTRAMUSCULAR | Status: AC
  Filled 2013-08-07: qty 2

## 2013-08-07 MED ORDER — BIVALIRUDIN 250 MG IV SOLR
INTRAVENOUS | Status: AC
  Filled 2013-08-07: qty 250

## 2013-08-07 MED ORDER — NON FORMULARY: Freq: Once | Status: DC

## 2013-08-07 MED ORDER — SODIUM CHLORIDE 0.9 % IV SOLN
1.0000 mL/kg/h | INTRAVENOUS | Status: AC
Start: 2013-08-07 — End: 2013-08-07
  Administered 2013-08-07: 1 mL/kg/h via INTRAVENOUS

## 2013-08-07 MED ORDER — VERAPAMIL HCL 2.5 MG/ML IV SOLN
INTRAVENOUS | Status: AC
  Filled 2013-08-07: qty 2

## 2013-08-07 MED ORDER — BUDESONIDE-FORMOTEROL FUMARATE 160-4.5 MCG/ACT IN AERO
2.0000 | INHALATION_SPRAY | Freq: Two times a day (BID) | RESPIRATORY_TRACT | Status: DC
  Administered 2013-08-08 (×2): 2 via RESPIRATORY_TRACT
  Filled 2013-08-07: qty 6

## 2013-08-07 MED ORDER — FENTANYL CITRATE 0.05 MG/ML IJ SOLN
INTRAMUSCULAR | Status: AC
  Filled 2013-08-07: qty 2

## 2013-08-07 MED ORDER — LORAZEPAM 0.5 MG PO TABS
0.5000 mg | ORAL_TABLET | Freq: Every evening | ORAL | Status: DC | PRN
  Administered 2013-08-08: 0.5 mg via ORAL
  Filled 2013-08-07: qty 1

## 2013-08-07 MED ORDER — PANTOPRAZOLE SODIUM 40 MG PO TBEC
40.0000 mg | DELAYED_RELEASE_TABLET | Freq: Every day | ORAL | Status: DC
  Administered 2013-08-07: 40 mg via ORAL
  Filled 2013-08-07 (×2): qty 1

## 2013-08-07 MED ORDER — CYCLOBENZAPRINE HCL 10 MG PO TABS: 10.0000 mg | ORAL_TABLET | Freq: Two times a day (BID) | ORAL | Status: DC | PRN

## 2013-08-07 NOTE — Interval H&P Note (Signed)
History and Physical Interval Note:  08/07/2013 1:05 PM  [name] [name]  has presented today for surgery, with the diagnosis of sob   The various methods of treatment have been discussed with the patient and family. After consideration of risks, benefits and other options for treatment, the patient has consented to  Procedure(s): LEFT AND RIGHT HEART CATHETERIZATION WITH CORONARY ANGIOGRAM (N/A) as a surgical intervention .  The patient's history has been reviewed, patient examined, no change in status, stable for surgery.  I have reviewed the patient's chart and labs.  Questions were answered to the patient's satisfaction.   Cath Lab Visit (complete for each Cath Lab visit)  Clinical Evaluation Leading to the Procedure:   ACS: no  Non-ACS:    Anginal Classification: CCS III  Anti-ischemic medical therapy: Minimal Therapy (1 class of medications)  Non-Invasive Test Results: Low-risk stress test findings: cardiac mortality <1%/year  Prior CABG: No previous CABG        Ander Slade [facility] 08/07/2013 1:05 PM

## 2013-08-07 NOTE — Care Management Note (Addendum)
  Page 1 of 1   08/09/2013     8:45:15 AM CARE MANAGEMENT NOTE 08/09/2013  Patient:  [name] [name], [name] [name]   Account Number:  [id number]  Date Initiated:  08/07/2013  Documentation initiated by:  Kymberley Raz  Subjective/Objective Assessment:   Advitted with SOB     Action/Plan:   CM to follow for disposition needs   Anticipated DC Date:  08/08/2013   Anticipated DC Plan:  [place]  CM consult  Medication Assistance      Choice offered to / List presented to:             Status of service:  Completed, signed off Medicare Important Message given?   (If response is "NO", the following Medicare IM given date fields will be blank) Date Medicare IM given:   Date Additional Medicare IM given:    Discharge Disposition:  HOME/SELF CARE  Per UR Regulation:    If discussed at Long Length of Stay Meetings, dates discussed:    Comments:  Melvin Marmo RN, BSN, MSHL, CCM  Nurse - Case Manager, (Unit [phone]  08/08/2013 Benefits Outcome:  No longer needed; d/c home on Plavix.   Kately Graffam RN, BSN, MSHL, CCM  Nurse - Case Manager, (Unit [phone]  08/07/2013 Per handoff note:  ---08/07/2013 1522 by Clelia Croft YOUNG--- Cath/ intervention/ OIB documented  Benefits check: prasugrel (EFFIENT) 10 MG tablet coverage, copays, authorization, deductibles, preferred pharmacy. Thanks, Mariann Laster RN, BSN, [place], CCM  Nurse - Case Manager, (Unit [phone]  08/07/2013

## 2013-08-07 NOTE — CV Procedure (Addendum)
    Cardiac Catheterization Procedure Note  Name: [name] [name] MRN: [id number] DOB: 1945-11-01  Procedure: Right and Left Heart Cath, Selective Coronary Angiography, LV angiography, PTCA and stenting of the proximal LAD.  Indication: 68 yo WM with symptoms of refractory dyspnea on exertion. Myoview study demonstrated possible anterior infarct with peri-infarct ischemia.  Procedural Details:  The right wrist was prepped, draped, and anesthetized with 1% lidocaine. Using the modified Seldinger technique, a 6 French slender sheath was introduced into the right radial artery. A 5 French sheath was inserted in the right brachial vein. 3 mg of verapamil was administered through the sheath, weight-based unfractionated heparin was administered intravenously. A swan Ganz catheter was used for measurement of right heart pressures and Fick cardiac output.  Standard Judkins catheters were used for selective coronary angiography and left ventriculography. Catheter exchanges were performed over an exchange length guidewire.  PROCEDURAL FINDINGS Hemodynamics: RA: 6/2 mean 1 mm Hg RV: 39/2 mm Hg PA: 36/9 mean 20 mm Hg PCWP: 12/17 mean 11 mm Hg AO 118/63 mean 84 mm Hg LV 115/10 mm Hg  No MV or AV gradient.   Oxygen saturations: PA: 62% AO: 91 %  Cardiac output (Fick): 6.83 L/min Cardiac index: 2.74 L/min/meter squared   Coronary angiography: Coronary dominance: right  Left mainstem: Normal  Left anterior descending (LAD): There is an eccentric complex 70% stenosis in the proximal LAD at the takeoff of the first septal perforator. There are mild irregularities in the distal vessel.  Left circumflex (LCx): The LCx gives rise to 2 OM branches. No significant disease.  Right coronary artery (RCA): The RCA has diffuse irregularities less than 20%.  Left ventriculography: Left ventricular systolic function is normal, LVEF is estimated at 55-65%, there is no significant mitral regurgitation    PCI Note:  Following the diagnostic procedure, the decision was made to proceed with PCI of the LAD.  Weight-based bivalirudin was given for anticoagulation. Effient 60 mg was given orally. Once a therapeutic ACT was achieved, a 6 [place] XBLAD guide catheter was inserted.  A P[place]ater coronary guidewire was used to cross the lesion.  The lesion was predilated with a 3.0 mm balloon.  The lesion was then stented with a 4.0 x 15 mm Xience Alpine stent.  The stent was postdilated with a 4.5 mm noncompliant balloon.  Following PCI, there was 0% residual stenosis and TIMI-3 flow. Final angiography confirmed an excellent result. The patient tolerated the procedure well. There were no immediate procedural complications. A TR band was used for radial hemostasis. The patient was transferred to the post catheterization recovery area for further monitoring.  PCI Data: Vessel - LAD/Segment - proximal Percent Stenosis (pre)  70% TIMI-flow 3 Stent 4.0 x 15 mm Xience Alpine Percent Stenosis (post) 0% TIMI-flow (post) 3  Final Conclusions:   1. Single vessel obstructive CAD 2. Normal LV function. 3. Normal right heart and LV filling pressures. 4. Successful stenting of the proximal LAD with a DES   Recommendations:  Dual antiplatelet therapy with ASA and Plavix for one month then discontinue ASA. Plavix therapy for one year. Will resume Coumadin therapy. I would prefer not bridging with Lovenox since he is on DAPT.  Ashtan Laton M Martinique, MD,FACC   08/07/2013, 2:19 PM

## 2013-08-07 NOTE — H&P (Signed)
Patient ID: [name] [name] MRN: [id number], DOB/AGE: 1945/12/04   Admit date: 08/07/2013   Primary Physician: [name], MD Primary Cardiologist: Lenna Sciara. Hochrein, MD   Pt. Profile:  68 y/o male with a h/o PAF and chronic dyspnea who presents today for a R & L heart cath.  Problem List  Past Medical History  Diagnosis Date  . BPH (benign prostatic hypertrophy)   . Spinal stenosis     congential  . Hypertension   . Asthma   . DVT (deep venous thrombosis)     a. 2010 Lower ext s/p back surgery.  . Pulmonary embolism     a. 2010 in setting of DVT post-op back surgery.  [name] PAF (paroxysmal atrial fibrillation)   . 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 Cataract     REMOVED  . COPD (chronic obstructive pulmonary disease)   . GERD (gastroesophageal reflux disease)   . Diverticulosis   . Hx of cardiovascular stress test     Lexiscan Myoview (06/2013): Diaphragmatic attenuation (cannot rule out anteroseptal infarct with mild peri-infarct ischemia), normal wall motion, EF 59%; low risk study  . Hx of echocardiogram 2015    Echo (06/2013): EF 60-65% normal wall motion, normal diastolic function, aortic sclerosis without stenosis, Trivial MR, mild SAM due to long, redundant mitral leaflets, mild RAE, normal RVSF  . Dyspnea on exertion     a. 07/2013 PFT's mild airflow obstruction, no restriction, sev decrease in DLCO.    Past Surgical History  Procedure Laterality Date  . Vasectomy    . Lumbar fusion      x2  . Total hip arthroplasty      bilat  . Cataracts    . Colonoscopy with polypectomy  2004     Allergies  Allergies  Allergen Reactions  . Penicillins Anaphylaxis     Because of a history of documented adverse serious drug reaction;Medi Alert bracelet  is recommended    HPI  68 y/o male with a h/o PAF, HTN, DVT/PE (post-op 2010), who has been seen in clinic frequently recently 2/2 persistent DOE.  Work-up has included a low-risk myoview earlier this year, Echo showing nl  LV fxn w/o significant valvular dzs, PFT's showing mild airflow obstruction w/o restriction and a severe decrease in DLCO, and a V:Q scan, which was low-probability for PE.  Due to progressive Ss, it was felt that he would require a R & L heart cath to r/o obstructive CAD and evaluate filling pressures.  He has not had chest pain and denies palpitations, pnd, orthopnea, n, v, syncope, edema, weight gain, or early satiety.   Home Medications  Prior to Admission medications   Medication Sig Start Date End Date Taking? Authorizing Provider  amLODipine (NORVASC) 5 MG tablet Take 1 tablet (5 mg total) by mouth daily. 06/16/13  Yes Scott Joylene Draft, PA-C  budesonide-formoterol (SYMBICORT) 160-4.5 MCG/ACT inhaler Inhale 2 puffs into the lungs 2 (two) times daily. 03/21/13  Yes Rosalita Chessman, DO  cyclobenzaprine (FLEXERIL) 10 MG tablet Take 1 tablet (10 mg total) by mouth 2 (two) times daily as needed for muscle spasms. 04/19/13  Yes Shaune Pollack, MD  diclofenac (VOLTAREN) 75 MG EC tablet Take 1 tablet by mouth 2 (two) times daily. 03/28/13  Yes Historical Provider, MD  enoxaparin (LOVENOX) 120 MG/0.8ML injection Inject 0.8 mLs (120 mg total) into the skin every 12 (twelve) hours. 08/02/13  Yes Minus Breeding, MD  LORazepam (ATIVAN) 0.5 MG tablet Take 1 tablet (0.5  mg total) by mouth at bedtime as needed for anxiety or sleep. 06/12/13  Yes Hendricks Limes, MD  metoprolol succinate (TOPROL-XL) 25 MG 24 hr tablet Take 1 tablet (25 mg total) by mouth daily. 07/03/13  Yes Scott Joylene Draft, PA-C  omeprazole (PRILOSEC) 20 MG capsule Take 20 mg by mouth at bedtime.   Yes Historical Provider, MD  sertraline (ZOLOFT) 50 MG tablet Take 50 mg by mouth at bedtime.   Yes Historical Provider, MD  flecainide (TAMBOCOR) 150 MG tablet Take 1 tablet (150 mg total) by mouth 2 (two) times daily. 12/19/12   Minus Breeding, MD  Ipratropium-Albuterol (COMBIVENT) 20-100 MCG/ACT AERS respimat Inhale 1 puff into the lungs every 6 (six) hours  as needed for wheezing. 06/08/13   Hendricks Limes, MD   Family History  Family History  Problem Relation Age of Onset  . Atrial fibrillation Mother   . COPD Mother   . Heart disease Father   . Colon cancer Father   . Heart attack Brother 65  . Alcohol abuse Brother 70    died post liver transplant   Social History  History   Social History  . Marital Status: Married    Spouse Name: N/A    Number of Children: 4  . Years of Education: N/A   Occupational History  . retired from [employer]  . Smoking status: Former Smoker -- 2.00 packs/day for 40 years    Types: Cigarettes    Quit date: 03/09/2000  . Smokeless tobacco: Never Used     Comment: smoked 1964-2002, up to 3-4 ppd  . Alcohol Use: Yes     Comment:  rarely  . Drug Use: No  . Sexual Activity: Not on file   Other Topics Concern  . Not on file   Social History Narrative  . No narrative on file    Review of Systems General:  No chills, fever, night sweats or weight changes.  Cardiovascular:  No chest pain, +++ dyspnea on exertion with dizziness, no edema, orthopnea, palpitations, paroxysmal nocturnal dyspnea. Dermatological: No rash, lesions/masses Respiratory: +++ chronic cough, dyspnea Urologic: No hematuria, dysuria Abdominal:   No nausea, vomiting, diarrhea, bright red blood per rectum, melena, or hematemesis Neurologic:  No visual changes, wkns, changes in mental status. All other systems reviewed and are otherwise negative except as noted above.  Physical Exam  Blood pressure 131/80, pulse 78, temperature 98.1 F (36.7 C), temperature source Oral, resp. rate 18, height 6\' 2"  (1.88 m), weight 275 lb (124.739 kg), SpO2 97.00%.  General: Pleasant, NAD Psych: Normal affect. Neuro: Alert and oriented X 3. Moves all extremities spontaneously. HEENT: Normal  Neck: Supple without bruits.  Difficult to assess jvp 2/2 girth. Lungs:  Resp regular and unlabored, scattered rhonchi and  occas exp wheeze. Heart: RRR no s3, s4, or murmurs - very distant heart sounds. Abdomen: Soft, non-tender, non-distended, BS + x 4.  Extremities: No clubbing, cyanosis or edema. DP/PT/Radials 2+ and equal bilaterally.  Labs  Lab Results  Component Value Date   WBC 9.9 08/02/2013   HGB 12.3* 08/02/2013   HCT 36.5* 08/02/2013   MCV 102.4* 08/02/2013   PLT 408.0* 08/02/2013    Recent Labs Lab 08/02/13 1006  NA 138  K 4.5  CL 106  CO2 24  BUN 14  CREATININE 1.1  CALCIUM 9.0  GLUCOSE 92   Lab Results  Component Value Date   CHOL 184 04/04/2013   HDL  41.80 04/04/2013   LDLCALC 110* 04/04/2013   TRIG 160.0* 04/04/2013   Lab Results  Component Value Date   INR 1.01 08/07/2013   INR 2.3* 08/02/2013   INR 2.3 08/02/2013    Radiology/Studies  Dg Chest 2 View  08/02/2013   CLINICAL DATA:  Dyspnea with exertion  EXAM: CHEST  2 VIEW  COMPARISON:  June 16, 2013  FINDINGS: There is underlying emphysematous change. There is mild scarring in the bases. There is no edema or consolidation. The heart size and pulmonary vascularity are within normal limits. No adenopathy. There is degenerative change in the thoracic spine with diffuse idiopathic skeletal hyperostosis.  IMPRESSION: Underlying emphysematous change with mild bibasilar scarring. No edema or consolidation. 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   Electronically Signed   By: Lowella Grip M.D.   On: 08/02/2013 08:17   ECG  RSR, 68, 1st deg avb  ASSESSMENT AND PLAN  1.  Chronic DOE:  Extensive w/u up to this point w/o significant findings to explain his Ss.  He is scheduled for a R & L heart cath today to assess further.  2.  PAF:  In sinus.  Coumadin on hold.  INR subRx.  Will need to resume anticoagulation w/o bridge following cath/disposition.  3.  HTN:  Stable.  4.  COPD:  Scattered rhonchi and occas exp wheeze on exam.  Says he has a chronic cough.  PFT's showed very mild COPD.  He uses symbicort 2  home.  5.  H/O DVT/PE:  Recent low-prob V:Q scan.  Signed, Rogelia Mire, NP 08/07/2013, 11:10 AM Patient seen and examined and history reviewed. Agree with above findings and plan. Needs right and left heart cath. The procedure and risks were reviewed including but not limited to death, myocardial infarction, stroke, arrythmias, bleeding, transfusion, emergency surgery, dye allergy, or renal dysfunction. The patient voices understanding and is agreeable to proceed.   Ander Slade [facility]  08/07/2013 1:04 PM

## 2013-08-08 ENCOUNTER — Encounter (HOSPITAL_COMMUNITY): Payer: Self-pay | Admitting: Physician Assistant

## 2013-08-08 ENCOUNTER — Other Ambulatory Visit: Payer: Medicare Other | Admitting: Physician Assistant

## 2013-08-08 DIAGNOSIS — R0609 Other forms of dyspnea: Secondary | ICD-10-CM | POA: Diagnosis not present

## 2013-08-08 DIAGNOSIS — I2510 Atherosclerotic heart disease of native coronary artery without angina pectoris: Secondary | ICD-10-CM | POA: Diagnosis not present

## 2013-08-08 DIAGNOSIS — I10 Essential (primary) hypertension: Secondary | ICD-10-CM | POA: Diagnosis not present

## 2013-08-08 DIAGNOSIS — E785 Hyperlipidemia, unspecified: Secondary | ICD-10-CM

## 2013-08-08 DIAGNOSIS — R0989 Other specified symptoms and signs involving the circulatory and respiratory systems: Secondary | ICD-10-CM | POA: Diagnosis not present

## 2013-08-08 DIAGNOSIS — Z9861 Coronary angioplasty status: Secondary | ICD-10-CM | POA: Diagnosis not present

## 2013-08-08 DIAGNOSIS — D539 Nutritional anemia, unspecified: Secondary | ICD-10-CM

## 2013-08-08 DIAGNOSIS — I4891 Unspecified atrial fibrillation: Secondary | ICD-10-CM | POA: Diagnosis not present

## 2013-08-08 HISTORY — PX: CORONARY ANGIOPLASTY WITH STENT PLACEMENT: SHX49

## 2013-08-08 LAB — BASIC METABOLIC PANEL
BUN: 16 mg/dL (ref 6–23)
CO2: 23 mEq/L (ref 19–32)
Calcium: 8.5 mg/dL (ref 8.4–10.5)
Chloride: 105 mEq/L (ref 96–112)
Creatinine, Ser: 1.14 mg/dL (ref 0.50–1.35)
GFR calc non Af Amer: 64 mL/min — ABNORMAL LOW (ref >90)
GFR, EST AFRICAN AMERICAN: 74 mL/min — AB (ref >90)
GLUCOSE: 94 mg/dL (ref 70–99)
Potassium: 4.3 mEq/L (ref 3.7–5.3)
Sodium: 140 mEq/L (ref 137–147)

## 2013-08-08 LAB — CBC
HEMATOCRIT: 32.3 % — AB (ref 39.0–52.0)
Hemoglobin: 10.8 g/dL — ABNORMAL LOW (ref 13.0–17.0)
MCH: 34.1 pg — AB (ref 26.0–34.0)
MCHC: 33.4 g/dL (ref 30.0–36.0)
MCV: 101.9 fL — ABNORMAL HIGH (ref 78.0–100.0)
Platelets: 339 10*3/uL (ref 150–400)
RBC: 3.17 MIL/uL — ABNORMAL LOW (ref 4.22–5.81)
RDW: 15.6 % — AB (ref 11.5–15.5)
WBC: 9.4 10*3/uL (ref 4.0–10.5)

## 2013-08-08 LAB — HEPATIC FUNCTION PANEL
ALBUMIN: 3.3 g/dL — AB (ref 3.5–5.2)
ALK PHOS: 70 U/L (ref 39–117)
ALT: 61 U/L — AB (ref 0–53)
AST: 58 U/L — ABNORMAL HIGH (ref 0–37)
Bilirubin, Direct: <0.2 mg/dL (ref 0.0–0.3)
TOTAL PROTEIN: 6.7 g/dL (ref 6.0–8.3)
Total Bilirubin: 0.4 mg/dL (ref 0.3–1.2)

## 2013-08-08 MED ORDER — NITROGLYCERIN 0.4 MG SL SUBL: 0.4000 mg | SUBLINGUAL_TABLET | SUBLINGUAL | Status: DC | PRN

## 2013-08-08 MED ORDER — CLOPIDOGREL BISULFATE 75 MG PO TABS: 75.0000 mg | ORAL_TABLET | Freq: Every day | ORAL | Status: DC

## 2013-08-08 MED ORDER — ASPIRIN EC 81 MG PO TBEC: 81.0000 mg | DELAYED_RELEASE_TABLET | Freq: Every day | ORAL | Status: DC

## 2013-08-08 MED ORDER — ATORVASTATIN CALCIUM 10 MG PO TABS: 10.0000 mg | ORAL_TABLET | Freq: Every evening | ORAL | Status: DC

## 2013-08-08 MED ORDER — PANTOPRAZOLE SODIUM 40 MG PO TBEC: 40.0000 mg | DELAYED_RELEASE_TABLET | Freq: Every day | ORAL | Status: DC

## 2013-08-08 MED ORDER — ATORVASTATIN CALCIUM 10 MG PO TABS
10.0000 mg | ORAL_TABLET | Freq: Every day | ORAL | Status: DC
  Filled 2013-08-08: qty 1

## 2013-08-08 MED ORDER — ATORVASTATIN CALCIUM 40 MG PO TABS
40.0000 mg | ORAL_TABLET | Freq: Every day | ORAL | Status: DC
  Filled 2013-08-08: qty 1

## 2013-08-08 MED FILL — Sodium Chloride IV Soln 0.9%: INTRAVENOUS | Qty: 50 | Status: AC

## 2013-08-08 NOTE — Progress Notes (Signed)
Patient: [name] [name] / Admit Date: 08/07/2013 / Date of Encounter: 08/08/2013, 6:32 AM  Subjective  Feels well. No CP, SOB. No complaints.  Objective   Telemetry: NSR/SB  Physical Exam: Blood pressure 123/62, pulse 58, temperature 98.5 F (36.9 C), temperature source Oral, resp. rate 18, height 6\' 2"  (1.88 m), weight 275 lb 9.2 oz (125 kg), SpO2 98.00%. General: Well developed, well nourished obese WM in no acute distress. Head: Normocephalic, atraumatic, sclera non-icteric, no xanthomas, nares are without discharge. Neck: JVP not elevated. Lungs: rhonchorous at bases, cleared with coughing. No rales or wheezes. Breathing is unlabored. Heart: RRR S1 S2 without murmurs, rubs, or gallops.  Abdomen: Soft, non-tender, non-distended with normoactive bowel sounds. No rebound/guarding. Extremities: No clubbing or cyanosis. No edema. Distal pedal pulses are 2+ and equal bilaterally. R radial site without hematoma or ecchymosis, pulse in tact Neuro: Alert and oriented X 3. Moves all extremities spontaneously. Psych:  Responds to questions appropriately with a normal affect.   Intake/Output Summary (Last 24 hours) at 08/08/13 4944 Last data filed at 08/08/13 0000  Gross per 24 hour  Intake    240 ml  Output    250 ml  Net    -10 ml    Inpatient Medications:  . amLODipine  5 mg Oral Daily  . aspirin  81 mg Oral Daily  . budesonide-formoterol  2 puff Inhalation BID  . clopidogrel  75 mg Oral Q breakfast  . diclofenac  75 mg Oral BID  . metoprolol succinate  25 mg Oral Daily  . pantoprazole  40 mg Oral Daily  . sertraline  50 mg Oral QHS   Infusions:    Labs:  Recent Labs  08/08/13 0406  NA 140  K 4.3  CL 105  CO2 23  GLUCOSE 94  BUN 16  CREATININE 1.14  CALCIUM 8.5   No results found for this basename: AST, ALT, ALKPHOS, BILITOT, PROT, ALBUMIN,  in the last 72 hours  Recent Labs  08/08/13 0406  WBC 9.4  HGB 10.8*  HCT 32.3*  MCV 101.9*  PLT 339     Radiology/Studies:  Dg Chest 2 View  08/02/2013   CLINICAL DATA:  Dyspnea with exertion  EXAM: CHEST  2 VIEW  COMPARISON:  June 16, 2013  FINDINGS: There is underlying emphysematous change. There is mild scarring in the bases. There is no edema or consolidation. The heart size and pulmonary vascularity are within normal limits. No adenopathy. There is degenerative change in the thoracic spine with diffuse idiopathic skeletal hyperostosis.  IMPRESSION: Underlying emphysematous change with mild bibasilar scarring. No edema or consolidation. 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   Electronically Signed   By: Lowella Grip M.D.   On: 08/02/2013 08:17   Nm Pulmonary Per & Vent  08/02/2013   CLINICAL DATA:  Shortness of Breath  EXAM: NUCLEAR MEDICINE VENTILATION - PERFUSION LUNG SCAN  Views: Anterior, posterior, left lateral, right lateral, RPO, LPO, RAO, LAO -ventilation and perfusion  Radionuclide: Technetium 53m DTPA-ventilation; Technetium 32m macroaggregated albumin-perfusion  Dose:  45.0 mCi-ventilation; 5.8 mCi-perfusion  Route of administration: Inhalation-ventilation; intravenous -perfusion  COMPARISON:  Chest radiograph Aug 02, 2013  FINDINGS: On the ventilation study, radiotracer uptake is homogeneous and symmetric bilaterally.  On the perfusion study, radiotracer uptake is homogeneous and symmetric bilaterally.  There is no appreciable ventilation/perfusion mismatch.  IMPRESSION: No appreciable ventilation or perfusion defects. Very low probability of pulmonary embolus.   Electronically Signed   By: Gwyndolyn Saxon  Jasmine December M.D.   On: 08/02/2013 08:19     Assessment and Plan  1. 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 S/p DES to prox LAD, normal LVF, R heart, LV filling pressures. Plan is to continue ASA and Plavix for 1 month then discontinue aspirin, continue Plavix for 1 yr, and resume Coumadin - Dr. Martinique prefers not to bridge with Lovenox  since he is on DAPT. Will clarify with rounding MD. No prior hx of stroke (has h/o DVT/PE but this was in 2010 and post-op back surgery, recent negative VQ scan). Continue metoprolol. Add statin, check baseline LFTs. Recent LDL 110 in January. 2. PAF - maintaining NSR. See above re: coumadin. Note: he was on flecanide prior to admission. Have discontinued this due to CAD - contraindicated. Will discuss further with MD.  3. HTN - controlled. 4. COPD - continue home regimen. 5. H/o DVT/PE - recent low risk VQ scan. 6. Macrocytic anemia - Hgb down slightly but may be periprocedural. Overall appears chronic. F/u PCP. Bleeding precautions reviewed. He denies any bleeding. He is not sure why he is taking Voltaren. Have asked him to d/c this and discuss w/ PCP due to risk of stomach bleeding with triple therapy. 7. Obesity Body mass index is 35.37 kg/(m^2).  Signed, Melina Copa PA-C  I have examined the patient and reviewed assessment and plan and discussed with patient.  Agree with above as stated.  Flecainide stopped.  May need another antiarrhythmic after Flecainide washes out.  F/u with Dr. Brantley Fling

## 2013-08-08 NOTE — Discharge Summary (Signed)
Discharge Summary   Patient ID: [name] [name] MRN: [id number], DOB/AGE: 08/25/1945 68 y.o. Admit date: 08/07/2013 D/C date:     08/08/2013  Primary Care Provider: [name], MD Primary Cardiologist: Hochrein  Primary Discharge Diagnoses:  1. DOE - possible anginal equivalent of newly diagnosed CAD - this admission: s/p DES to prox LAD, normal LVF, R heart, LV filling pressures - plan is to continue ASA and Plavix for 1 month then discontinue aspirin, continue Plavix for 1 yr, and resume Coumadin 2. PAF, maintaining NSR - flecainide discontinued due to CAD 3. HTN 4. COPD -  07/2013 PFT's mild airflow obstruction, no restriction, sev decrease in DLCO.  5. H/o DVT/PE with recent low risk VQ scan.  6. Macrocytic anemia, appears chronic 7. Obesity Body mass index is 35.37 kg/(m^2). 8. Mild transaminitis  Secondary Discharge Diagnoses:  1. BPH (benign prostatic hypertrophy)   2. Spinal stenosis congential   3. Asthma  4. DVT (deep venous thrombosis) - 2010 Lower ext s/p back surgery 5. 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 Cataract REMOVED   8. GERD (gastroesophageal reflux disease)   9. Diverticulosis   10. Hx of echocardiogram Echo (06/2013): EF 60-65% normal wall motion, normal diastolic function, aortic sclerosis without stenosis, Trivial MR, mild SAM due to long, redundant mitral leaflets, mild RAE, normal RVSF   Hospital Course: [name] [name] is a 68 y/o male with a h/o PAF, HTN, DVT/PE (post-op 2010), who has been seen in clinic frequently recently 2/2 persistent DOE. Work-up has included a low-risk myoview earlier this year, Echo showing nl LV fxn w/o significant valvular dzs, PFT's showing mild airflow obstruction w/o restriction and a severe decrease in DLCO, and a V:Q scan, which was low-probability for PE. Due to progressive symptoms, it was felt that he would require a R & L heart  cath to r/o obstructive CAD and evaluate filling pressures. He denied any chest pain, palpitations, PND, orthopnea, nausea, vomiting, syncope, edema, weight gain, or early satiety. He was brought in for cath 08/07/13 at this demonstrated  1. Single vessel obstructive CAD  2. Normal LV function. LVEF is estimated at 55-65%, there is no significant mitral regurgitation  3. Normal right heart and LV filling pressures.  4. Successful stenting of the proximal LAD with a DES   The plan is to continue ASA and Plavix for 1 month then discontinue aspirin, continue Plavix for 1 yr, and resume Coumadin. The decision was made not to bridge with Lovenox since his is on DAPT now (note h/o DVT/PE but this was in 2010 and post-op back surgery, recent negative VQ scan). Pharmacy recommended to resume home dose of Coumadin. The patient tolerated the procedure well. He did have evidence of chronic macrocytic anemia with Hgb down slightly but this may be periprocedural in light of cath and IV hydration. He denied any bleeding. Bleeding precautions were reviewed and he was asked to f/u PCP for further evaluation. The patient did not recall why he was taking Voltaren chronically. He was asked to D/C this and discuss alternatives with PCP given multiple blood thinners. Baseline LFTs were sent revealing mild transaminitis (AST 58, ALT 61) with normal levels 3 & 4 months ago. He was started on low dose statin therapy, and we will repeat CMET & lipids in 1 month. Also, due to newly diagnosed CAD, the patient's flecainide was discontinued. He remains in NSR at this  time. He will follow up as an outpatient to determine if further antiarrhythmic therapies are needed after flecainide washes out. Dr. Irish Lack has seen and examined the patient today and feels he is stable for discharge.  The patient obtains his prescriptions through mail order. Per his request, I wrote him a hand-written rx for Lipitor/Plavix/Protonix/NTG (30 days) to fill  locally, and electronically prescribed full refills to the mail order pharmacy.    Discharge Vitals: Blood pressure 129/62, pulse 61, temperature 97.1 F (36.2 C), temperature source Oral, resp. rate 20, height 6\' 2"  (1.88 m), weight 275 lb 9.2 oz (125 kg), SpO2 97.00%.  Labs: Lab Results  Component Value Date   WBC 9.4 08/08/2013   HGB 10.8* 08/08/2013   HCT 32.3* 08/08/2013   MCV 101.9* 08/08/2013   PLT 339 08/08/2013    Recent Labs Lab 08/08/13 0406  NA 140  K 4.3  CL 105  CO2 23  BUN 16  CREATININE 1.14  CALCIUM 8.5  PROT 6.7  BILITOT 0.4  ALKPHOS 70  ALT 61*  AST 58*  GLUCOSE 94    Lab Results  Component Value Date   CHOL 184 04/04/2013   HDL 41.80 04/04/2013   LDLCALC 110* 04/04/2013   TRIG 160.0* 04/04/2013    Diagnostic Studies/Procedures    Cardiac Cath 08/07/13 Cardiac Catheterization Procedure Note  Name: [name] [name]  MRN: [id number]  DOB: [birth year]  Procedure: Right and Left Heart Cath, Selective Coronary Angiography, LV angiography, PTCA and stenting of the proximal LAD.  Indication: 68 yo WM with symptoms of refractory dyspnea on exertion. Myoview study demonstrated possible anterior infarct with peri-infarct ischemia.  Procedural Details: The right wrist was prepped, draped, and anesthetized with 1% lidocaine. Using the modified Seldinger technique, a 6 French slender sheath was introduced into the right radial artery. A 5 French sheath was inserted in the right brachial vein. 3 mg of verapamil was administered through the sheath, weight-based unfractionated heparin was administered intravenously. A swan Ganz catheter was used for measurement of right heart pressures and Fick cardiac output. Standard Judkins catheters were used for selective coronary angiography and left ventriculography. Catheter exchanges were performed over an exchange length guidewire.  PROCEDURAL FINDINGS  Hemodynamics:  RA: 6/2 mean 1 mm Hg  RV: 39/2 mm Hg  PA: 36/9 mean 20 mm Hg    PCWP: 12/17 mean 11 mm Hg  AO 118/63 mean 84 mm Hg  LV 115/10 mm Hg  No MV or AV gradient.  Oxygen saturations:  PA: 62%  AO: 91 %  Cardiac output (Fick): 6.83 L/min  Cardiac index: 2.74 L/min/meter squared  Coronary angiography:  Coronary dominance: right  Left mainstem: Normal  Left anterior descending (LAD): There is an eccentric complex 70% stenosis in the proximal LAD at the takeoff of the first septal perforator. There are mild irregularities in the distal vessel.  Left circumflex (LCx): The LCx gives rise to 2 OM branches. No significant disease.  Right coronary artery (RCA): The RCA has diffuse irregularities less than 20%.  Left ventriculography: Left ventricular systolic function is normal, LVEF is estimated at 55-65%, there is no significant mitral regurgitation  PCI Note: Following the diagnostic procedure, the decision was made to proceed with PCI of the LAD. Weight-based bivalirudin was given for anticoagulation. Effient 60 mg was given orally. Once a therapeutic ACT was achieved, a 6 [place] XBLAD guide catheter was inserted. A P[place]ater coronary guidewire was used to cross the lesion. The lesion was predilated with a  3.0 mm balloon. The lesion was then stented with a 4.0 x 15 mm Xience Alpine stent. The stent was postdilated with a 4.5 mm noncompliant balloon. Following PCI, there was 0% residual stenosis and TIMI-3 flow. Final angiography confirmed an excellent result. The patient tolerated the procedure well. There were no immediate procedural complications. A TR band was used for radial hemostasis. The patient was transferred to the post catheterization recovery area for further monitoring.  PCI Data:  Vessel - LAD/Segment - proximal  Percent Stenosis (pre) 70%  TIMI-flow 3  Stent 4.0 x 15 mm Xience Alpine  Percent Stenosis (post) 0%  TIMI-flow (post) 3  Final Conclusions:  1. Single vessel obstructive CAD  2. Normal LV function.  3. Normal right heart and LV filling  pressures.  4. Successful stenting of the proximal LAD with a DES  Recommendations:  Dual antiplatelet therapy with ASA and Plavix for one month then discontinue ASA. Plavix therapy for one year. Will resume Coumadin therapy. I would prefer not bridging with Lovenox since he is on DAPT.  Peter M Martinique, MD,FACC  08/07/2013, 2:19 PM   Discharge Medications   Current Discharge Medication List    START taking these medications   Details  aspirin EC 81 MG tablet Take 1 tablet (81 mg total) by mouth daily. Take for 1 month then STOP.    atorvastatin (LIPITOR) 10 MG tablet Take 1 tablet (10 mg total) by mouth every evening. Qty: 90 tablet, Refills: 0    clopidogrel (PLAVIX) 75 MG tablet Take 1 tablet (75 mg total) by mouth daily with breakfast. Qty: 90 tablet, Refills: 3    nitroGLYCERIN (NITROSTAT) 0.4 MG SL tablet Place 1 tablet (0.4 mg total) under the tongue every 5 (five) minutes as needed for chest pain up to 3 doses. Qty: 25 tablet, Refills: 1    pantoprazole (PROTONIX) 40 MG tablet Take 1 tablet (40 mg total) by mouth at bedtime. Qty: 30 tablet, Refills: 1      CONTINUE these medications which have NOT CHANGED   Details  amLODipine (NORVASC) 5 MG tablet Take 1 tablet (5 mg total) by mouth daily.    Associated Diagnoses: Essential hypertension, benign    budesonide-formoterol (SYMBICORT) 160-4.5 MCG/ACT inhaler Inhale 2 puffs into the lungs 2 (two) times daily.    Associated Diagnoses: COPD (chronic obstructive pulmonary disease)    cyclobenzaprine (FLEXERIL) 10 MG tablet Take 1 tablet (10 mg total) by mouth 2 (two) times daily as needed for muscle spasms.     LORazepam (ATIVAN) 0.5 MG tablet Take 1 tablet (0.5 mg total) by mouth at bedtime as needed for anxiety or sleep.     metoprolol succinate (TOPROL-XL) 25 MG 24 hr tablet Take 1 tablet (25 mg total) by mouth daily.     sertraline (ZOLOFT) 50 MG tablet Take 50 mg by mouth at bedtime.    warfarin (COUMADIN) 3 MG  tablet Take 3 mg by mouth as directed. Take 2 tablets (6 mg) by mouth Tuesdays, Thursdays, Sundays. Take 1 and a half tablets (4.5 mg) all other days.    Ipratropium-Albuterol (COMBIVENT) 20-100 MCG/ACT AERS respimat Inhale 1 puff into the lungs every 6 (six) hours as needed for wheezing.      STOP taking these medications     diclofenac (VOLTAREN) 75 MG EC tablet      enoxaparin (LOVENOX) 120 MG/0.8ML injection      omeprazole (PRILOSEC) 20 MG capsule      flecainide (TAMBOCOR) 150 MG tablet  Disposition   The patient will be discharged in stable condition to home. Discharge Instructions   Amb Referral to Cardiac Rehabilitation    Complete by:  As directed      Diet - low sodium heart healthy    Complete by:  As directed      Increase activity slowly    Complete by:  As directed   The plan is for you to: - continue aspirin for 1 month then STOP. - continue Plavix for 1 year then STOP. - Continue Coumadin indefinitely, unless otherwise instructed by your cardiologist.  Some studies suggest Prilosec/Omeprazole interacts with Plavix. We changed your Prilosec/Omeprazole to Protonix for less chance of interaction.   Patients taking blood thinners should generally stay away from medicines like Voltaren (diclofenac), ibuprofen, Advil, Motrin, naproxen, and Aleve due to risk of stomach bleeding. You may take Tylenol as directed or talk to your primary doctor about alternatives.  Due to your new diagnosis of coronary artery disease, your flecainide was discontinued. (This medicine cannot be used in patients with documented heart blockages.) You will follow up as an outpatient to determine if you need to consider another rhythm-controlling medicine for your atrial fibrillation.          Follow-up Information   Follow up with [name], MD. (To discuss further evaluation/monitoring of your anemia)    Specialty:  Internal Medicine   Contact information:   [address] [address] [phone]       Follow up with [facility] (Coumadin clinic 08/10/13 at 11:15am)    Specialty:  Cardiology   Contact information:   [address] [place] [address] [phone]      Follow up with Richardson Dopp, PA-C. ([facility] - 09/04/13 at 11:30am. You will have labwork on the same day. You may come early to have it drawn (arrive 25 mins early) or have it drawn after your appointment.)    Specialty:  Physician Assistant   Contact information:   [address] [address] [phone]         Duration of Discharge Encounter: Greater than 30 minutes including physician and PA time.  Signed, Nedra Hai Dunn PA-C 08/08/2013, 9:55 AM  I have examined the patient and reviewed assessment and plan and discussed with patient.  Agree with above as stated.  May need new antiarrhytmic after flecainide washes out.  Jettie Booze

## 2013-08-08 NOTE — Progress Notes (Signed)
CARDIAC REHAB PHASE I   PRE:  Rate/Rhythm: 48 SB  BP:  Supine: 129/62  Sitting:   Standing:    SaO2: 91%RA  MODE:  Ambulation: 300 ft   POST:  Rate/Rhythm: 81 SR  BP:  Supine:   Sitting: 146/70  Standing:    SaO2: 98%RA 0750-0853 Pt walked 300 ft on RA with hand held asst. A little dizzy when first up but improved. Pt is dyspneic when he walks and has to stop and catch his breath frequently. No CP. Education completed with pt. Stressed importance of plavix and aspirin with stent. Gave walking instructions for exercise but pt may be limited by his breathing. Strongly stressed CRP 2 for pt so that he can have exercise but have expertise of staff for his breathing also. Pt agreed to referral and is to check with insurance.    Graylon Good, RN BSN  08/08/2013 8:50 AM

## 2013-08-10 ENCOUNTER — Ambulatory Visit (INDEPENDENT_AMBULATORY_CARE_PROVIDER_SITE_OTHER): Payer: Medicare Other

## 2013-08-10 DIAGNOSIS — Z86718 Personal history of other venous thrombosis and embolism: Secondary | ICD-10-CM

## 2013-08-10 DIAGNOSIS — I4891 Unspecified atrial fibrillation: Secondary | ICD-10-CM

## 2013-08-10 DIAGNOSIS — I480 Paroxysmal atrial fibrillation: Secondary | ICD-10-CM

## 2013-08-10 LAB — POCT INR: INR: 1.2

## 2013-08-11 ENCOUNTER — Ambulatory Visit (INDEPENDENT_AMBULATORY_CARE_PROVIDER_SITE_OTHER): Payer: Medicare Other | Admitting: Internal Medicine

## 2013-08-11 ENCOUNTER — Other Ambulatory Visit (INDEPENDENT_AMBULATORY_CARE_PROVIDER_SITE_OTHER): Payer: Medicare Other

## 2013-08-11 ENCOUNTER — Encounter: Payer: Self-pay | Admitting: Internal Medicine

## 2013-08-11 VITALS — BP 126/84 | HR 89 | Temp 98.2°F | Wt 276.6 lb

## 2013-08-11 DIAGNOSIS — I2510 Atherosclerotic heart disease of native coronary artery without angina pectoris: Secondary | ICD-10-CM | POA: Diagnosis not present

## 2013-08-11 DIAGNOSIS — D539 Nutritional anemia, unspecified: Secondary | ICD-10-CM

## 2013-08-11 LAB — CBC WITH DIFFERENTIAL/PLATELET
BASOS ABS: 0.1 10*3/uL (ref 0.0–0.1)
BASOS PCT: 0.5 % (ref 0.0–3.0)
Eosinophils Absolute: 0.4 10*3/uL (ref 0.0–0.7)
Eosinophils Relative: 3.6 % (ref 0.0–5.0)
HEMATOCRIT: 37.6 % — AB (ref 39.0–52.0)
HEMOGLOBIN: 12.6 g/dL — AB (ref 13.0–17.0)
LYMPHS ABS: 2.9 10*3/uL (ref 0.7–4.0)
Lymphocytes Relative: 26.4 % (ref 12.0–46.0)
MCHC: 33.6 g/dL (ref 30.0–36.0)
MCV: 101.9 fl — ABNORMAL HIGH (ref 78.0–100.0)
MONOS PCT: 11.5 % (ref 3.0–12.0)
Monocytes Absolute: 1.3 10*3/uL — ABNORMAL HIGH (ref 0.1–1.0)
NEUTROS ABS: 6.4 10*3/uL (ref 1.4–7.7)
Neutrophils Relative %: 58 % (ref 43.0–77.0)
Platelets: 492 10*3/uL — ABNORMAL HIGH (ref 150.0–400.0)
RBC: 3.69 Mil/uL — ABNORMAL LOW (ref 4.22–5.81)
RDW: 15.4 % (ref 11.5–15.5)
WBC: 11.1 10*3/uL — ABNORMAL HIGH (ref 4.0–10.5)

## 2013-08-11 LAB — VITAMIN B12: Vitamin B-12: 394 pg/mL (ref 211–911)

## 2013-08-11 NOTE — Patient Instructions (Signed)
Your next office appointment will be determined based upon review of your pending labs . Those instructions will be transmitted to you  by mail. 

## 2013-08-11 NOTE — Progress Notes (Signed)
Pre visit review using our clinic review tool, if applicable. No additional management support is needed unless otherwise documented below in the visit note. 

## 2013-08-11 NOTE — Progress Notes (Signed)
   Subjective:    Patient ID: CHAYNE BAUMGART, male    DOB: 08/29/1945, 68 y.o.   MRN: [id number]  HPI   He underwent left and right heart catheterization 6/1 by Dr. Martinique with PCI-S for a 90% lesion.  He states that his breathing is 40-45% better. He still has some dyspnea or orthopnea with waist flexion.  Lab values revealed decreased albumin at 3.3 and anemia with hemoglobin 10.8 hematocrit 32.3 with  MCV of 101.9.  He was asked to follow up for the anemia.    Review of Systems He denies epistaxis, hemoptysis, hematuria, melena, or rectal bleeding.He has no unexplained weight loss, dysphagia, or abdominal pain. He describes easy bruising &  difficulty stopping bleeding  but in the context of warfarin therapy.  He denies any numbness or tingling or burning in his hands or feet.     Objective:   Physical Exam Gen.: Weight excess centrally; well-nourished in appearance. Alert, appropriate and cooperative throughout exam.  Head: Normocephalic without obvious abnormalities Eyes: No corneal or conjunctival inflammation noted.No icterus Ears: External  ear exam reveals no significant lesions or deformities.   Nose: External nasal exam reveals no deformity or inflammation. Nasal mucosa are pink and moist. No lesions or exudates noted.   Mouth: Oral mucosa and oropharynx reveal no lesions or exudates. Teeth in good repair. Neck: No deformities, masses, or tenderness noted.  Thyroid normal. Lungs: Normal respiratory effort; chest expands symmetrically. Minor rales & rhonchi; no increased work of breathing. Heart: Normal rate and rhythm. Normal S1 and S2. No gallop, click, or rub. No murmur. Abdomen: Bowel sounds normal; abdomen soft and nontender. No masses, organomegaly or hernias noted.                                   Musculoskeletal/extremities: No deformity or scoliosis noted of  the thoracic or lumbar spine. No clubbing, cyanosis, edema, or significant extremity  deformity noted.  Range of motion normal .Tone & strength normal. Able to lie down & sit up w/o help. Negative SLR bilaterally Vascular: Carotid, radial artery, dorsalis pedis and  posterior tibial pulses are full and equal. No bruits present. Neurologic: Alert and oriented x3. Gait normal .     Skin: Intact without suspicious lesions or rashes. Lymph: No cervical, axillary lymphadenopathy present. Psych: Mood and affect are normal. Normally interactive                                                                                        Assessment & Plan:  #1 anemia, mild macrocytic  Plan: CBC and differential, B12 level

## 2013-08-15 ENCOUNTER — Telehealth: Payer: Self-pay | Admitting: Cardiology

## 2013-08-15 ENCOUNTER — Telehealth: Payer: Self-pay

## 2013-08-15 NOTE — Telephone Encounter (Signed)
Patient's wife has been advised and transferred to schedule an appt

## 2013-08-15 NOTE — Telephone Encounter (Addendum)
Called patient's wife back. He missed a step and fell on Sunday and hit the knee and ankle. She actually got a call back aleady from PCP (Dr. Linna Darner) and he advised that his CBC was stable and recommended that he be seen by [facility]/ orthopod  for knee/ankle injury. She will also call Dr. Alvan Dame who did his previous hip surgery and see which orthopod can see him sooner. Reminded her of appointments this Thursday with CV[facility] and in a few weeks with Richardson Dopp PA. She mentioned that he is less SOB since he had the stent placed by Dr.Jordan. Will let Pam (Dr. Rosezella [place] nurse) know that she does not have to call patient's wife back.

## 2013-08-15 NOTE — Telephone Encounter (Signed)
I recommend he see Dr Charlann Boxer, Sports Medicine specialist., phone # [phone].  Anemia significantly improved ; no additional studies needed.

## 2013-08-15 NOTE — Telephone Encounter (Signed)
Phone call from patient's wife this morning at 8:18 am. She states patient fell Sunday while she was at church. He missed a step on their deck and fell on his knee and his ankle is also swollen. She also has questions on his labs. Wants to know what the next step. Do you want to see him today? Please advise.

## 2013-08-15 NOTE — Telephone Encounter (Signed)
New message     Pt fell Sunday.  Pt had angioplasty June 1st.  Today pt is weak and does not feel well.  Wife wanted to talk to a nurse.

## 2013-08-16 ENCOUNTER — Telehealth: Payer: Self-pay | Admitting: Cardiology

## 2013-08-16 NOTE — Telephone Encounter (Signed)
Lm to cb.

## 2013-08-16 NOTE — Telephone Encounter (Signed)
New problem    Pt had stent placed 6/1 and is feeling very tired and weak. Pt would like to speak to nurse concerning this matter.

## 2013-08-16 NOTE — Telephone Encounter (Signed)
Wife called stating pt is still fatigued and feeling washed out esp after being out for long periods of time.  Advised that since pt has been sedentary for so long he is going to have to slowly increase his activity level.   Advised to walk 30 mins a day to begin with even if he has to break it up to 15 mins at a time.  Advised to avoid the heat of the day and being in the sun for long periods of time.  She stated understanding.  She also wanted to know if they could travel to the beach next weekend.  Advised to reason he should not travel.  To take it easy and not over-do in the sun and heat, make sure he stays hydrated.  Pt denies any cp, sob or other s/s.  They will call back with further needs.

## 2013-08-17 ENCOUNTER — Ambulatory Visit (INDEPENDENT_AMBULATORY_CARE_PROVIDER_SITE_OTHER): Payer: Medicare Other

## 2013-08-17 DIAGNOSIS — I4891 Unspecified atrial fibrillation: Secondary | ICD-10-CM

## 2013-08-17 DIAGNOSIS — Z86718 Personal history of other venous thrombosis and embolism: Secondary | ICD-10-CM | POA: Diagnosis not present

## 2013-08-17 DIAGNOSIS — I480 Paroxysmal atrial fibrillation: Secondary | ICD-10-CM

## 2013-08-17 LAB — POCT INR: INR: 2.6

## 2013-08-31 ENCOUNTER — Other Ambulatory Visit: Payer: Self-pay | Admitting: Internal Medicine

## 2013-08-31 NOTE — Telephone Encounter (Signed)
OK # 90 

## 2013-09-01 ENCOUNTER — Other Ambulatory Visit: Payer: Self-pay | Admitting: Internal Medicine

## 2013-09-01 NOTE — Telephone Encounter (Signed)
OK X1 The medication is " as needed" and should not be taken on a regular basis. It should be taken :as needed only. Regular use can increase risk of addiction but more importantly affect level of alertness and balance with increased risk of falling.

## 2013-09-04 ENCOUNTER — Other Ambulatory Visit (INDEPENDENT_AMBULATORY_CARE_PROVIDER_SITE_OTHER): Payer: Medicare Other

## 2013-09-04 ENCOUNTER — Encounter: Payer: Self-pay | Admitting: Physician Assistant

## 2013-09-04 ENCOUNTER — Ambulatory Visit (INDEPENDENT_AMBULATORY_CARE_PROVIDER_SITE_OTHER): Payer: Medicare Other | Admitting: Pharmacist

## 2013-09-04 ENCOUNTER — Ambulatory Visit (INDEPENDENT_AMBULATORY_CARE_PROVIDER_SITE_OTHER): Payer: Medicare Other | Admitting: Physician Assistant

## 2013-09-04 ENCOUNTER — Telehealth: Payer: Self-pay | Admitting: *Deleted

## 2013-09-04 VITALS — BP 110/78 | HR 74 | Ht 74.0 in | Wt 275.0 lb

## 2013-09-04 DIAGNOSIS — I4891 Unspecified atrial fibrillation: Secondary | ICD-10-CM

## 2013-09-04 DIAGNOSIS — I2510 Atherosclerotic heart disease of native coronary artery without angina pectoris: Secondary | ICD-10-CM

## 2013-09-04 DIAGNOSIS — Z86718 Personal history of other venous thrombosis and embolism: Secondary | ICD-10-CM | POA: Diagnosis not present

## 2013-09-04 DIAGNOSIS — E785 Hyperlipidemia, unspecified: Secondary | ICD-10-CM

## 2013-09-04 DIAGNOSIS — I480 Paroxysmal atrial fibrillation: Secondary | ICD-10-CM

## 2013-09-04 DIAGNOSIS — I10 Essential (primary) hypertension: Secondary | ICD-10-CM | POA: Diagnosis not present

## 2013-09-04 LAB — COMPREHENSIVE METABOLIC PANEL
ALBUMIN: 4.4 g/dL (ref 3.5–5.2)
ALT: 26 U/L (ref 0–53)
AST: 26 U/L (ref 0–37)
Alkaline Phosphatase: 76 U/L (ref 39–117)
BILIRUBIN TOTAL: 0.7 mg/dL (ref 0.2–1.2)
BUN: 14 mg/dL (ref 6–23)
CALCIUM: 9.4 mg/dL (ref 8.4–10.5)
CHLORIDE: 103 meq/L (ref 96–112)
CO2: 24 meq/L (ref 19–32)
Creatinine, Ser: 1.2 mg/dL (ref 0.4–1.5)
GFR: 64.97 mL/min (ref >60.00)
GLUCOSE: 93 mg/dL (ref 70–99)
Potassium: 4.6 mEq/L (ref 3.5–5.1)
SODIUM: 137 meq/L (ref 135–145)
TOTAL PROTEIN: 8.1 g/dL (ref 6.0–8.3)

## 2013-09-04 LAB — LIPID PANEL
CHOLESTEROL: 112 mg/dL (ref 0–200)
HDL: 36.9 mg/dL — ABNORMAL LOW (ref >39.00)
LDL Cholesterol: 38 mg/dL (ref 0–99)
NONHDL: 75.1
Total CHOL/HDL Ratio: 3
Triglycerides: 186 mg/dL — ABNORMAL HIGH (ref 0.0–149.0)
VLDL: 37.2 mg/dL (ref 0.0–40.0)

## 2013-09-04 LAB — POCT INR: INR: 3.5

## 2013-09-04 MED ORDER — METOPROLOL SUCCINATE ER 25 MG PO TB24: 25.0000 mg | ORAL_TABLET | Freq: Every day | ORAL | Status: DC

## 2013-09-04 NOTE — Patient Instructions (Signed)
Your physician recommends that you schedule a follow-up appointment in: 2 MONTHS WITH DR. HOCHREIN  NO CHANGES WERE MADE TODAY

## 2013-09-04 NOTE — Telephone Encounter (Signed)
pt notified about lab results with verbal understanding  

## 2013-09-04 NOTE — Progress Notes (Signed)
Cardiology Office Note   Date:  09/04/2013   ID:  [name] [name], DOB Jul 30, 1945, MRN [id number]  PCP:  [name] Cobble, MD  Cardiologist:  Dr. Minus Breeding    History of Present Illness: [name] [name] is a 68 y.o. male with a history of paroxysmal atrial fibrillation, HTN, prior DVT and pulmonary emboli following back surgery in 2010, COPD. Patient was seen by Dr. Percival Spanish recently with complaints of dizziness. Patient had no significant blood pressure drop with standing. With ambulation, his oxygen remained in the 90s but his heart rate never increased above 76. He was in normal sinus rhythm. Dr. Percival Spanish stopped his Cardizem and beta blocker. He was placed on the 24-hour Holter monitor that demonstrated NSR with PAC/PVCs.  I saw him in f/u.  BNP was normal. Chest x-ray was unremarkable.  I arranged a stress test.  He had poor exercise tolerance due to dyspnea and was changed to a Lexiscan.  Images were low risk.  I reviewed this with Dr. Percival Spanish and we have referred him back to pulmonology.  VQ scan was normal.  He had no significant pulmonary findings to explain his dyspnea.  He was therefore set up for R/L heart cath.  Right and left heart catheterization demonstrated high-grade proximal LAD stenosis which was treated with a DES. Right heart pressures were normal. Dual antiplatelet therapy was recommended for one month (Plavix and ASA). The patient would then discontinue aspirin (remain on Plavix) and resume Coumadin.  He returns for follow up.    The patient has actually been on triple therapy since discharge. Plan is to discontinue aspirin after 1 month and remain on Plavix and Coumadin. His breathing has improved. He remains somewhat deconditioned. He is scheduled to start cardiac rehabilitation soon.  He denies chest discomfort. He denies orthopnea, PND or edema. He denies syncope.   Studies:  - Echo (06/29/07):  EF 55-60%, no RWMA, mild LVH, mild focal basal septal hypertrophy,  mild ascending Ao dilatation (41 mm), mild MR, mild to mod LAE.    - Nuclear (06/07/12):  No ischemia, EF 64%, NORMAL.  [facility] Myoview (06/2013): Diaphragmatic attenuation (cannot rule out anteroseptal infarct with mild peri-infarct ischemia), normal wall motion, EF 59%; low risk study  - LHC (08/07/13): Proximal LAD 70%, irregularities in the RCA <20%, EF 55-65%. PCI:  4.0 x 15 mm Xience Alpine DES to the proximal LAD   Recent Labs: 04/04/2013: HDL Cholesterol by NMR 41.80; LDL (calc) 110*; TSH 3.36  06/16/2013: Pro B Natriuretic peptide (BNP) 38.0  08/08/2013: ALT 61*; Creatinine 1.14; Potassium 4.3  08/11/2013: Hemoglobin 12.6*   Wt Readings from Last 3 Encounters:  09/04/13 275 lb (124.739 kg)  08/11/13 276 lb 9.6 oz (125.465 kg)  08/08/13 275 lb 9.2 oz (125 kg)     Past Medical History  Diagnosis Date  . BPH (benign prostatic hypertrophy)   . Spinal stenosis     congential  . Hypertension   . Asthma   . DVT (deep venous thrombosis)     a. 2010 Lower ext s/p back surgery.  . Pulmonary embolism     a. 2010 in setting of DVT post-op back surgery. b. Low prob VQ 07/2013.  [name] [name] PAF (paroxysmal atrial fibrillation)     a. Flecainide discontinued 07/2013 in setting of CAD.  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 COPD (chronic obstructive pulmonary disease)     a. 07/2013 PFT's mild airflow obstruction, no restriction, sev decrease in DLCO.  [name] [name] GERD (  gastroesophageal reflux disease)   . Diverticulosis   . Hx of echocardiogram 2015    Echo (06/2013): EF 60-65% normal wall motion, normal diastolic function, aortic sclerosis without stenosis, Trivial MR, mild SAM due to long, redundant mitral leaflets, mild RAE, normal RVSF  . Dyspnea on exertion     a. 07/2013 PFT's mild airflow obstruction, no restriction, sev decrease in DLCO.  [name] [name] CAD (coronary artery disease)     a. 07/2013: s/p DES to LAD, normal LVF.  . Macrocytic anemia     a. 07/2013: documented on prior labs.  . Obesity   .  Transaminitis     a. 07/2013: mild.    Current Outpatient Prescriptions  Medication Sig Dispense Refill  . amLODipine (NORVASC) 5 MG tablet Take 1 tablet (5 mg total) by mouth daily.  180 tablet  3  . aspirin EC 81 MG tablet Take 1 tablet (81 mg total) by mouth daily. Take for 1 month then STOP.      [name] [name] atorvastatin (LIPITOR) 10 MG tablet Take 1 tablet (10 mg total) by mouth every evening.  90 tablet  0  . budesonide-formoterol (SYMBICORT) 160-4.5 MCG/ACT inhaler Inhale 2 puffs into the lungs 2 (two) times daily.  1 Inhaler  3  . clopidogrel (PLAVIX) 75 MG tablet Take 1 tablet (75 mg total) by mouth daily with breakfast.  90 tablet  3  . cyclobenzaprine (FLEXERIL) 10 MG tablet Take 1 tablet (10 mg total) by mouth 2 (two) times daily as needed for muscle spasms.  20 tablet  0  . LORazepam (ATIVAN) 0.5 MG tablet TAKE 1 TABLET BY MOUTH EVERY NIGHT AT BEDTIME AS NEEDED FOR ANXIETY OR SLEEP  30 tablet  0  . metoprolol succinate (TOPROL-XL) 25 MG 24 hr tablet Take 1 tablet (25 mg total) by mouth daily.  30 tablet  11  . nitroGLYCERIN (NITROSTAT) 0.4 MG SL tablet Place 1 tablet (0.4 mg total) under the tongue every 5 (five) minutes as needed for chest pain (up to 3 doses).  25 tablet  1  . pantoprazole (PROTONIX) 40 MG tablet Take 1 tablet (40 mg total) by mouth at bedtime.  30 tablet  1  . sertraline (ZOLOFT) 50 MG tablet Take 50 mg by mouth at bedtime.      [name] [name] warfarin (COUMADIN) 3 MG tablet Take 3 mg by mouth as directed. Take 2 tablets (6 mg) by mouth Tuesdays, Thursdays, Sundays. Take 1 and a half tablets (4.5 mg) all other days.       No current facility-administered medications for this visit.    Allergies:   Penicillins   Social History:  The patient  reports that he quit smoking about 13 years ago. His smoking use included Cigarettes. He has a 80 pack-year smoking history. He has never used smokeless tobacco. He reports that he drinks alcohol. He reports that he does not use illicit drugs.    Family History:  The patient's family history includes Alcohol abuse (age of onset: 84) in his brother; Atrial fibrillation in his mother; COPD in his mother; Colon cancer in his father; Heart attack (age of onset: 84) in his brother; Heart disease in his father.   ROS:  Please see the history of present illness.   He has a chronic cough. He denies palpitations. He denies bleeding problems.   All other systems reviewed and negative.   PHYSICAL EXAM: VS:  BP 110/78  Pulse 74  Ht 6\' 2"  (1.88 m)  Wt 275 lb (  124.739 kg)  BMI 35.29 kg/m2 Well nourished, well developed, in no acute distress HEENT: normal Neck: no JVD Cardiac:  distant S1, S2; RRR; no murmur Lungs:  Decreased breath sounds with mild inspiratory and expiratory wheezing throughout Abd: soft, nontender, no hepatomegaly Ext: no edemaright wrist without hematoma or mass  Skin: warm and dry Neuro:  CNs 2-12 intact, no focal abnormalities noted  EKG:   NSR, HR 74, normal axis, nonspecific ST-T wave changes   ASSESSMENT AND PLAN:  1. CAD:  Doing well after recent PCI with a DES to the LAD. He is on triple therapy. Aspirin will be stopped after one month and he will remain on Plavix and Coumadin. Continue statin. Proceed with cardiac rehabilitation as planned. 2. Paroxysmal Atrial Fibrillation:  No apparent recurrence. Continue Toprol and Coumadin. He was taken off of flecainide due to CAD.  Options are somewhat limited at this time for rhythm control. We could place him on amiodarone or Tikosyn. Given his previous lung issues, he may not be a good candidate for amiodarone. At this point, I would suggest keeping him on rate control therapy only. If he has recurrent atrial fibrillation and is significantly symptomatic or has a difficult to control heart rate, consider Tikosyn at that point. 3. Hypertension:  Controlled. 4. Hyperlipidemia:  Continue statin. Repeat lipids and LFTs due today. LFTs mildly elevated in the hospital. This  will be checked again today. 5. Macrocytic Anemia:  F/u with PCP. 6. Disposition:  Follow up with Dr. Percival Spanish in 2 months.  Signed, Versie Starks, MHS 09/04/2013 12:11 PM    [facility] [address], [place], [place]  [id number] Phone: [phone]; Fax: [phone]

## 2013-09-07 ENCOUNTER — Other Ambulatory Visit: Payer: Medicare Other

## 2013-09-07 ENCOUNTER — Encounter (HOSPITAL_COMMUNITY)
Admission: RE | Admit: 2013-09-07 | Discharge: 2013-09-07 | Disposition: A | Discharge status: Home / Self Care | Payer: Medicare Other | Source: Ambulatory Visit | Attending: Cardiology | Admitting: Cardiology

## 2013-09-07 DIAGNOSIS — I428 Other cardiomyopathies: Secondary | ICD-10-CM | POA: Insufficient documentation

## 2013-09-07 DIAGNOSIS — Z5189 Encounter for other specified aftercare: Secondary | ICD-10-CM | POA: Insufficient documentation

## 2013-09-07 DIAGNOSIS — I4891 Unspecified atrial fibrillation: Secondary | ICD-10-CM | POA: Insufficient documentation

## 2013-09-07 DIAGNOSIS — I2510 Atherosclerotic heart disease of native coronary artery without angina pectoris: Secondary | ICD-10-CM | POA: Insufficient documentation

## 2013-09-07 NOTE — Progress Notes (Signed)
Cardiac Rehab Medication Review by a Pharmacist  Does the patient  feel that his/her medications are working for him/her?  no  Has the patient been experiencing any side effects to the medications prescribed?  no  Does the patient measure his/her own blood pressure or blood glucose at home?  yes   Does the patient have any problems obtaining medications due to transportation or finances?   no  Understanding of regimen: good Understanding of indications: good Potential of compliance: good    Pharmacist comments:  Pleasant 68 yo M with good understanding of regimen. He does have some concern that the lorazepam does not help for sleep and he is still having a hard time falling asleep and staying asleep. He also states that he feels that the Symbicort has only mildly improved his shortness of breath. Otherwise, no other medication related issues were noted.   [name] [name], PharmD Clinical Pharmacist - Resident Pager: [phone] Pharmacy: [phone] 09/07/2013 8:54 AM

## 2013-09-11 ENCOUNTER — Encounter (HOSPITAL_COMMUNITY): Payer: Medicare Other

## 2013-09-11 ENCOUNTER — Encounter (HOSPITAL_COMMUNITY)
Admission: RE | Admit: 2013-09-11 | Discharge: 2013-09-11 | Disposition: A | Discharge status: Home / Self Care | Payer: Medicare Other | Source: Ambulatory Visit | Attending: Cardiology | Admitting: Cardiology

## 2013-09-11 DIAGNOSIS — Z5189 Encounter for other specified aftercare: Secondary | ICD-10-CM | POA: Diagnosis not present

## 2013-09-11 DIAGNOSIS — I2510 Atherosclerotic heart disease of native coronary artery without angina pectoris: Secondary | ICD-10-CM | POA: Diagnosis not present

## 2013-09-11 DIAGNOSIS — I4891 Unspecified atrial fibrillation: Secondary | ICD-10-CM | POA: Diagnosis not present

## 2013-09-11 DIAGNOSIS — I428 Other cardiomyopathies: Secondary | ICD-10-CM | POA: Diagnosis not present

## 2013-09-11 NOTE — Progress Notes (Signed)
Pt started cardiac rehab today.  Pt tolerated light exercise without difficulty. Telemetry rhythm Sinus vital signs stable. [name] [name]'s goals are to walk without discomfort and to improve shortness of breath. [name] [name] will be absent Wednesday and Friday he is going to the beach.

## 2013-09-13 ENCOUNTER — Encounter (HOSPITAL_COMMUNITY)
Admission: RE | Admit: 2013-09-13 | Discharge: 2013-09-13 | Disposition: A | Discharge status: Home / Self Care | Payer: Medicare Other | Source: Ambulatory Visit | Attending: Cardiology | Admitting: Cardiology

## 2013-09-13 ENCOUNTER — Encounter (HOSPITAL_COMMUNITY): Payer: Medicare Other

## 2013-09-13 DIAGNOSIS — Z5189 Encounter for other specified aftercare: Secondary | ICD-10-CM | POA: Diagnosis not present

## 2013-09-15 ENCOUNTER — Encounter (HOSPITAL_COMMUNITY)
Admission: RE | Admit: 2013-09-15 | Discharge: 2013-09-15 | Disposition: A | Discharge status: Home / Self Care | Payer: Medicare Other | Source: Ambulatory Visit | Attending: Cardiology | Admitting: Cardiology

## 2013-09-15 ENCOUNTER — Encounter (HOSPITAL_COMMUNITY): Payer: Medicare Other

## 2013-09-15 DIAGNOSIS — Z5189 Encounter for other specified aftercare: Secondary | ICD-10-CM | POA: Diagnosis not present

## 2013-09-18 ENCOUNTER — Encounter (HOSPITAL_COMMUNITY)
Admission: RE | Admit: 2013-09-18 | Discharge: 2013-09-18 | Disposition: A | Discharge status: Home / Self Care | Payer: Medicare Other | Source: Ambulatory Visit | Attending: Cardiology | Admitting: Cardiology

## 2013-09-18 ENCOUNTER — Encounter (HOSPITAL_COMMUNITY): Payer: Medicare Other

## 2013-09-18 DIAGNOSIS — Z5189 Encounter for other specified aftercare: Secondary | ICD-10-CM | POA: Diagnosis not present

## 2013-09-18 NOTE — Progress Notes (Addendum)
Pt with short runs of intermittent afib during exercise.  Pt with history of paroxysmal afib in the past and is currently therapeutic on coumadin.  Will fax strip over for md review based upon last office visit which noted pt had not had any reoccurrence of afib.  Pt asymptomatic and rate was controled, bp remained 156/78 while on treadmill. Cherre Huger, BSN

## 2013-09-20 ENCOUNTER — Encounter (HOSPITAL_COMMUNITY): Payer: Medicare Other

## 2013-09-20 ENCOUNTER — Encounter (HOSPITAL_COMMUNITY)
Admission: RE | Admit: 2013-09-20 | Discharge: 2013-09-20 | Disposition: A | Discharge status: Home / Self Care | Payer: Medicare Other | Source: Ambulatory Visit | Attending: Cardiology | Admitting: Cardiology

## 2013-09-20 DIAGNOSIS — Z5189 Encounter for other specified aftercare: Secondary | ICD-10-CM | POA: Diagnosis not present

## 2013-09-22 ENCOUNTER — Encounter (HOSPITAL_COMMUNITY)
Admission: RE | Admit: 2013-09-22 | Discharge: 2013-09-22 | Disposition: A | Discharge status: Home / Self Care | Payer: Medicare Other | Source: Ambulatory Visit | Attending: Cardiology | Admitting: Cardiology

## 2013-09-22 ENCOUNTER — Encounter (HOSPITAL_COMMUNITY): Payer: Medicare Other

## 2013-09-22 DIAGNOSIS — Z5189 Encounter for other specified aftercare: Secondary | ICD-10-CM | POA: Diagnosis not present

## 2013-09-22 NOTE — Progress Notes (Signed)
Academic intern reviewed home exercise with pt today.  Pt plans to walk at home for exercise.  Reviewed THR, pulse, RPE, sign and symptoms, NTG use, and when to call 911 or MD.  Pt voiced understanding. Alberteen Sam, MA, ACSM RCEP

## 2013-09-25 ENCOUNTER — Encounter (HOSPITAL_COMMUNITY): Payer: Medicare Other

## 2013-09-25 ENCOUNTER — Encounter (HOSPITAL_COMMUNITY)
Admission: RE | Admit: 2013-09-25 | Discharge: 2013-09-25 | Disposition: A | Discharge status: Home / Self Care | Payer: Medicare Other | Source: Ambulatory Visit | Attending: Cardiology | Admitting: Cardiology

## 2013-09-25 DIAGNOSIS — Z5189 Encounter for other specified aftercare: Secondary | ICD-10-CM | POA: Diagnosis not present

## 2013-09-27 ENCOUNTER — Telehealth (HOSPITAL_COMMUNITY): Payer: Self-pay | Admitting: *Deleted

## 2013-09-27 ENCOUNTER — Encounter (HOSPITAL_COMMUNITY): Payer: Medicare Other

## 2013-09-27 ENCOUNTER — Encounter (HOSPITAL_COMMUNITY): Admission: RE | Admit: 2013-09-27 | Payer: Medicare Other | Source: Ambulatory Visit

## 2013-09-29 ENCOUNTER — Encounter (HOSPITAL_COMMUNITY)
Admission: RE | Admit: 2013-09-29 | Discharge: 2013-09-29 | Disposition: A | Discharge status: Home / Self Care | Payer: Medicare Other | Source: Ambulatory Visit | Attending: Cardiology | Admitting: Cardiology

## 2013-09-29 ENCOUNTER — Encounter (HOSPITAL_COMMUNITY): Payer: Medicare Other

## 2013-09-29 ENCOUNTER — Ambulatory Visit (INDEPENDENT_AMBULATORY_CARE_PROVIDER_SITE_OTHER): Payer: Medicare Other | Admitting: *Deleted

## 2013-09-29 DIAGNOSIS — I4891 Unspecified atrial fibrillation: Secondary | ICD-10-CM | POA: Diagnosis not present

## 2013-09-29 DIAGNOSIS — I480 Paroxysmal atrial fibrillation: Secondary | ICD-10-CM

## 2013-09-29 DIAGNOSIS — Z5189 Encounter for other specified aftercare: Secondary | ICD-10-CM | POA: Diagnosis not present

## 2013-09-29 DIAGNOSIS — Z86718 Personal history of other venous thrombosis and embolism: Secondary | ICD-10-CM

## 2013-09-29 LAB — POCT INR: INR: 2.9

## 2013-10-02 ENCOUNTER — Encounter (HOSPITAL_COMMUNITY): Payer: Medicare Other

## 2013-10-02 ENCOUNTER — Encounter (HOSPITAL_COMMUNITY)
Admission: RE | Admit: 2013-10-02 | Discharge: 2013-10-02 | Disposition: A | Discharge status: Home / Self Care | Payer: Medicare Other | Source: Ambulatory Visit | Attending: Cardiology | Admitting: Cardiology

## 2013-10-02 DIAGNOSIS — Z5189 Encounter for other specified aftercare: Secondary | ICD-10-CM | POA: Diagnosis not present

## 2013-10-04 ENCOUNTER — Encounter (HOSPITAL_COMMUNITY): Payer: Medicare Other

## 2013-10-04 ENCOUNTER — Encounter (HOSPITAL_COMMUNITY)
Admission: RE | Admit: 2013-10-04 | Discharge: 2013-10-04 | Disposition: A | Discharge status: Home / Self Care | Payer: Medicare Other | Source: Ambulatory Visit | Attending: Cardiology | Admitting: Cardiology

## 2013-10-04 DIAGNOSIS — Z5189 Encounter for other specified aftercare: Secondary | ICD-10-CM | POA: Diagnosis not present

## 2013-10-06 ENCOUNTER — Encounter (HOSPITAL_COMMUNITY)
Admission: RE | Admit: 2013-10-06 | Discharge: 2013-10-06 | Disposition: A | Discharge status: Home / Self Care | Payer: Medicare Other | Source: Ambulatory Visit | Attending: Cardiology | Admitting: Cardiology

## 2013-10-06 ENCOUNTER — Encounter (HOSPITAL_COMMUNITY): Payer: Medicare Other

## 2013-10-06 DIAGNOSIS — Z5189 Encounter for other specified aftercare: Secondary | ICD-10-CM | POA: Diagnosis not present

## 2013-10-09 ENCOUNTER — Encounter (HOSPITAL_COMMUNITY): Payer: Medicare Other

## 2013-10-09 ENCOUNTER — Encounter (HOSPITAL_COMMUNITY)
Admission: RE | Admit: 2013-10-09 | Discharge: 2013-10-09 | Disposition: A | Discharge status: Home / Self Care | Payer: Medicare Other | Source: Ambulatory Visit | Attending: Cardiology | Admitting: Cardiology

## 2013-10-09 DIAGNOSIS — Z9861 Coronary angioplasty status: Secondary | ICD-10-CM | POA: Diagnosis not present

## 2013-10-09 NOTE — Progress Notes (Signed)
[name] [name] 68 y.o. male Nutrition Note Spoke with pt.  Nutrition Survey reviewed with pt. Pt is following Step 1  of the Therapeutic Lifestyle Changes diet. Barriers to further dietary changes include pt's mind set regarding heart healthy eating "if I have to eat tofu I won't do it" and "I'm going to have the real thing (e.g. Butter, sour cream, etc.) or not have it." Pt wants to lose wt. Pt has been not been actively trying to lose wt. Wt loss tips briefly reviewed. Pt is on Coumadin. Pt is aware of foods that have vitamin K in them. Pt declined a handout on Consistent Vitamin K intake. Pt expressed understanding of the information reviewed. Pt aware of nutrition education classes offered and plans on attending nutrition classes.  Nutrition Diagnosis   Food-and nutrition-related knowledge deficit related to lack of exposure to information as related to diagnosis of: ? CVD    Obesity related to excessive energy intake as evidenced by a BMI of 36.5  Nutrition Intervention   Benefits of adopting Therapeutic Lifestyle Changes discussed when Medficts reviewed.   Pt to attend the Portion Distortion class   Pt to attend the  ? Nutrition I class - met; 10/03/13                    ? Nutrition II class   Continue client-centered nutrition education by RD, as part of interdisciplinary care.  Goal(s)   Pt to identify and limit food sources of saturated fat, trans fat, and cholesterol   Pt to identify food quantities necessary to achieve: ? wt loss to a goal wt of 252-270 lb (114.6-122.8 kg) at graduation from cardiac rehab.   Monitor and Evaluate progress toward nutrition goal with team. Nutrition Risk:  Low   Derek Mound, M.Ed, RD, LDN, CDE 10/09/2013 12:15 PM

## 2013-10-11 ENCOUNTER — Encounter (HOSPITAL_COMMUNITY): Payer: Medicare Other

## 2013-10-11 ENCOUNTER — Encounter (HOSPITAL_COMMUNITY)
Admission: RE | Admit: 2013-10-11 | Discharge: 2013-10-11 | Disposition: A | Discharge status: Home / Self Care | Payer: Medicare Other | Source: Ambulatory Visit | Attending: Cardiology | Admitting: Cardiology

## 2013-10-11 DIAGNOSIS — Z9861 Coronary angioplasty status: Secondary | ICD-10-CM | POA: Diagnosis not present

## 2013-10-13 ENCOUNTER — Encounter (HOSPITAL_COMMUNITY): Payer: Medicare Other

## 2013-10-13 ENCOUNTER — Encounter (HOSPITAL_COMMUNITY)
Admission: RE | Admit: 2013-10-13 | Discharge: 2013-10-13 | Disposition: A | Discharge status: Home / Self Care | Payer: Medicare Other | Source: Ambulatory Visit | Attending: Cardiology | Admitting: Cardiology

## 2013-10-13 DIAGNOSIS — Z9861 Coronary angioplasty status: Secondary | ICD-10-CM | POA: Diagnosis not present

## 2013-10-16 ENCOUNTER — Encounter (HOSPITAL_COMMUNITY): Payer: Medicare Other

## 2013-10-16 ENCOUNTER — Encounter (HOSPITAL_COMMUNITY)
Admission: RE | Admit: 2013-10-16 | Discharge: 2013-10-16 | Disposition: A | Discharge status: Home / Self Care | Payer: Medicare Other | Source: Ambulatory Visit | Attending: Cardiology | Admitting: Cardiology

## 2013-10-16 DIAGNOSIS — Z9861 Coronary angioplasty status: Secondary | ICD-10-CM | POA: Diagnosis not present

## 2013-10-18 ENCOUNTER — Encounter (HOSPITAL_COMMUNITY): Payer: Medicare Other

## 2013-10-18 ENCOUNTER — Encounter (HOSPITAL_COMMUNITY)
Admission: RE | Admit: 2013-10-18 | Discharge: 2013-10-18 | Disposition: A | Discharge status: Home / Self Care | Payer: Medicare Other | Source: Ambulatory Visit | Attending: Cardiology | Admitting: Cardiology

## 2013-10-18 DIAGNOSIS — Z9861 Coronary angioplasty status: Secondary | ICD-10-CM | POA: Diagnosis not present

## 2013-10-18 NOTE — Progress Notes (Signed)
Pt in today for cardiac rehab.  Pt is accompanied by his wife.  Pt with elevated bp systolic over 161 and diastolic bp over 90 today on stepper.  Pt bp remained elevated during exercise but returned to WNL on exit bp 130/80.  Wife reported that pt has had some dietary indiscretions related to salt intake.  Pt put salt on his nacho's and watermelon.  Reviewed with pt salt limits per day. Pt laughs.  Wife called rehab after she got home because she was worried about him sleeping after exercise.  Pt scheduled to see Hochrein later this month.  Asked if pt had seen his primary MD since his cardiac event.  Pt indicated he had not.  Called dr. Linna Darner office for next available appt per wife's request.  Appointment made for august 18th at 9:00.  Called Dr. Michelle Piper office to see if he had any openings for pt to be seen before his scheduled appt. Dr. Michelle Piper did not have any appointments neither did the office extender.  Wife ok with waiting for husband to be seen on Tuesday.   Cherre Huger, BSN

## 2013-10-20 ENCOUNTER — Encounter (HOSPITAL_COMMUNITY): Payer: Medicare Other

## 2013-10-23 ENCOUNTER — Encounter (HOSPITAL_COMMUNITY)
Admission: RE | Admit: 2013-10-23 | Discharge: 2013-10-23 | Disposition: A | Discharge status: Home / Self Care | Payer: Medicare Other | Source: Ambulatory Visit | Attending: Cardiology | Admitting: Cardiology

## 2013-10-23 ENCOUNTER — Encounter (HOSPITAL_COMMUNITY): Payer: Medicare Other

## 2013-10-23 DIAGNOSIS — Z9861 Coronary angioplasty status: Secondary | ICD-10-CM | POA: Diagnosis not present

## 2013-10-24 ENCOUNTER — Encounter: Payer: Self-pay | Admitting: Internal Medicine

## 2013-10-24 ENCOUNTER — Ambulatory Visit (INDEPENDENT_AMBULATORY_CARE_PROVIDER_SITE_OTHER): Payer: Medicare Other | Admitting: Internal Medicine

## 2013-10-24 VITALS — BP 120/70 | HR 89 | Temp 98.2°F | Wt 275.4 lb

## 2013-10-24 DIAGNOSIS — I2510 Atherosclerotic heart disease of native coronary artery without angina pectoris: Secondary | ICD-10-CM | POA: Diagnosis not present

## 2013-10-24 DIAGNOSIS — I10 Essential (primary) hypertension: Secondary | ICD-10-CM | POA: Diagnosis not present

## 2013-10-24 MED ORDER — LORAZEPAM 1 MG PO TABS: ORAL_TABLET | ORAL | Status: DC

## 2013-10-24 MED ORDER — BUDESONIDE-FORMOTEROL FUMARATE 160-4.5 MCG/ACT IN AERO: 2.0000 | INHALATION_SPRAY | Freq: Two times a day (BID) | RESPIRATORY_TRACT | Status: DC

## 2013-10-24 NOTE — Progress Notes (Signed)
   Subjective:    Patient ID: [name] [name], male    DOB: August 15, 1945, 68 y.o.   MRN: [id number]  HPI  He goes to cardiac rehabilitation 3 days a week. With exercise systolic blood pressure has climbed as high as 204/?. His pulse has also been elevated. This is associated with sweating but not chest pain or dyspnea.  At home blood pressures have ranged 120-130/70-low 80s.  He denies any adverse effects to medicines such as edema.  He is compliant with his blood pressure medicines  He denies any other active cardiopulmonary symptoms.  Symbicort has been effective in treating his reactive airways disease.  He uses lorazepam 0.5 mg1-2 times per week; he requested an increase the dose.    Review of Systems Significant headaches, epistaxis, chest pain, palpitations, exertional dyspnea, claudication, paroxysmal nocturnal dyspnea, or edema absent.       Objective:   Physical Exam   Pertinent positive findings include: S4 is present with minimal slurring. No significant murmur present Very low-grade scattered rhonchi without increased work of breathing. As noted O2 sats 96%. Abdomen protuberant. Posterior tibial pulses are slightly decreased but intact. There is trace edema of ankles suggested.  Appears well-nourished & in no acute distress No carotid bruits are present.No neck pain distention present at 10 - 15 degrees. Thyroid normal to palpation Heart rhythm and rate are normal There is no evidence of aortic aneurysm or renal artery bruits Abdomen soft with no organomegaly or masses. No HJR No clubbing, cyanosis  present. Pedal pulses are intact  No ischemic skin changes are present . Fingernails healthy  Alert and oriented. Strength, tone normal            Assessment & Plan:  #1 HTN See orders & AVS

## 2013-10-24 NOTE — Patient Instructions (Addendum)
   Please take  TWO of the metoprolol SR 25 mg pills 2 hours prior to any exercise program. Lorazepam is among those medication which experts have documented to have a very  high risk of affecting  mental  alertness  & balance. This results in increased risk of falling with serious health or life threatening injury. Such medication should be taken as infrequently as possible and @  the lowest possible dose.It should not be taken with alcohol, sedatives  or other agents which have a similar  adverse risk potential. These risks are greater as we age as there is decreased ability of the liver and kidneys to metabolize and excrete the medication, resulting in   increased blood levels of the active ingredient.  Repeat the isometric exercises discussed 4- 5 times prior to standing if you've been in bed as Lorazepam may lower BP.

## 2013-10-24 NOTE — Progress Notes (Signed)
Pre visit review using our clinic review tool, if applicable. No additional management support is needed unless otherwise documented below in the visit note. 

## 2013-10-25 ENCOUNTER — Telehealth: Payer: Self-pay

## 2013-10-25 ENCOUNTER — Encounter (HOSPITAL_COMMUNITY): Payer: Medicare Other

## 2013-10-25 ENCOUNTER — Encounter (HOSPITAL_COMMUNITY)
Admission: RE | Admit: 2013-10-25 | Discharge: 2013-10-25 | Disposition: A | Discharge status: Home / Self Care | Payer: Medicare Other | Source: Ambulatory Visit | Attending: Cardiology | Admitting: Cardiology

## 2013-10-25 DIAGNOSIS — Z9861 Coronary angioplasty status: Secondary | ICD-10-CM | POA: Diagnosis not present

## 2013-10-25 NOTE — Telephone Encounter (Signed)
New AVS instruction have been mailed

## 2013-10-25 NOTE — Telephone Encounter (Signed)
Message copied by Shelly Coss on Wed Oct 25, 2013  8:23 AM ------      Message from: Hendricks Limes      Created: Wed Oct 25, 2013  6:00 AM       Please mail copy of corrected AVS instructions . Thanks, Hopp ------

## 2013-10-25 NOTE — Progress Notes (Signed)
Pt seen by Dr. Linna Darner on yesterday.  Reviewed office notes.  No med changes noted.  Continue to monitor.  Pt will see Dr. Percival Spanish later this month.  Cherre Huger, BSN

## 2013-10-27 ENCOUNTER — Encounter (HOSPITAL_COMMUNITY)
Admission: RE | Admit: 2013-10-27 | Discharge: 2013-10-27 | Disposition: A | Discharge status: Home / Self Care | Payer: Medicare Other | Source: Ambulatory Visit | Attending: Cardiology | Admitting: Cardiology

## 2013-10-27 ENCOUNTER — Encounter (HOSPITAL_COMMUNITY): Payer: Medicare Other

## 2013-10-27 ENCOUNTER — Ambulatory Visit (INDEPENDENT_AMBULATORY_CARE_PROVIDER_SITE_OTHER): Payer: Medicare Other | Admitting: Pharmacist

## 2013-10-27 DIAGNOSIS — I4891 Unspecified atrial fibrillation: Secondary | ICD-10-CM | POA: Diagnosis not present

## 2013-10-27 DIAGNOSIS — Z9861 Coronary angioplasty status: Secondary | ICD-10-CM | POA: Diagnosis not present

## 2013-10-27 DIAGNOSIS — Z86718 Personal history of other venous thrombosis and embolism: Secondary | ICD-10-CM | POA: Diagnosis not present

## 2013-10-27 DIAGNOSIS — I480 Paroxysmal atrial fibrillation: Secondary | ICD-10-CM

## 2013-10-27 LAB — POCT INR: INR: 5.1

## 2013-10-30 ENCOUNTER — Encounter (HOSPITAL_COMMUNITY): Payer: Medicare Other

## 2013-10-30 ENCOUNTER — Encounter: Payer: Self-pay | Admitting: Internal Medicine

## 2013-11-01 ENCOUNTER — Encounter (HOSPITAL_COMMUNITY): Payer: Medicare Other

## 2013-11-03 ENCOUNTER — Encounter (HOSPITAL_COMMUNITY)
Admission: RE | Admit: 2013-11-03 | Discharge: 2013-11-03 | Disposition: A | Discharge status: Home / Self Care | Payer: Medicare Other | Source: Ambulatory Visit | Attending: Cardiology | Admitting: Cardiology

## 2013-11-03 ENCOUNTER — Encounter (HOSPITAL_COMMUNITY): Payer: Medicare Other

## 2013-11-03 DIAGNOSIS — Z9861 Coronary angioplasty status: Secondary | ICD-10-CM | POA: Diagnosis not present

## 2013-11-06 ENCOUNTER — Encounter: Payer: Self-pay | Admitting: Cardiology

## 2013-11-06 ENCOUNTER — Ambulatory Visit (INDEPENDENT_AMBULATORY_CARE_PROVIDER_SITE_OTHER): Payer: Medicare Other | Admitting: Cardiology

## 2013-11-06 ENCOUNTER — Encounter (HOSPITAL_COMMUNITY): Payer: Medicare Other

## 2013-11-06 VITALS — BP 110/70 | HR 76 | Ht 74.0 in | Wt 275.5 lb

## 2013-11-06 DIAGNOSIS — I2510 Atherosclerotic heart disease of native coronary artery without angina pectoris: Secondary | ICD-10-CM | POA: Diagnosis not present

## 2013-11-06 DIAGNOSIS — I480 Paroxysmal atrial fibrillation: Secondary | ICD-10-CM

## 2013-11-06 DIAGNOSIS — I4891 Unspecified atrial fibrillation: Secondary | ICD-10-CM

## 2013-11-06 DIAGNOSIS — I10 Essential (primary) hypertension: Secondary | ICD-10-CM

## 2013-11-06 MED ORDER — PANTOPRAZOLE SODIUM 40 MG PO TBEC: 40.0000 mg | DELAYED_RELEASE_TABLET | Freq: Every day | ORAL | Status: DC

## 2013-11-06 MED ORDER — AMLODIPINE BESYLATE 5 MG PO TABS: 5.0000 mg | ORAL_TABLET | Freq: Every day | ORAL | Status: DC

## 2013-11-06 MED ORDER — ATORVASTATIN CALCIUM 10 MG PO TABS: 10.0000 mg | ORAL_TABLET | Freq: Every evening | ORAL | Status: DC

## 2013-11-06 NOTE — Patient Instructions (Signed)
Your physician recommends that you schedule a follow-up appointment in: 6 months with Dr. Hochrein  

## 2013-11-06 NOTE — Progress Notes (Signed)
HPI  The patient presents for follow up of atrial fibrillation, HTN, prior DVT and pulmonary emboli and CAD. R/L heart cath.  Catheterization demonstrated high-grade proximal LAD stenosis which was treated with a DES. Right heart pressures were normal.   He is doing cardiac rehabilitation. He thinks he's gotten somewhat better and is having less of shortness of breath and dizziness he was having. Is much less fatigued walking across her room and he was previously. He still has episodes of this. However, he does not think this is related to his atrial fibrillation as he doesn't feel his heart racing or skipping. He's not having any chest pressure, neck or arm discomfort. He's not having any PND or orthopnea.   Allergies  Allergen Reactions  . Penicillins Anaphylaxis     Because of a history of documented adverse serious drug reaction;Medi Alert bracelet  is recommended    Current Outpatient Prescriptions  Medication Sig Dispense Refill  . amLODipine (NORVASC) 5 MG tablet Take 1 tablet (5 mg total) by mouth daily.  180 tablet  3  . atorvastatin (LIPITOR) 10 MG tablet Take 1 tablet (10 mg total) by mouth every evening.  90 tablet  0  . budesonide-formoterol (SYMBICORT) 160-4.5 MCG/ACT inhaler Inhale 2 puffs into the lungs 2 (two) times daily.  3 Inhaler  3  . clopidogrel (PLAVIX) 75 MG tablet Take 1 tablet (75 mg total) by mouth daily with breakfast.  90 tablet  3  . LORazepam (ATIVAN) 1 MG tablet 1/2-1 qd prn only  30 tablet  1  . metoprolol succinate (TOPROL-XL) 25 MG 24 hr tablet Take 1 tablet (25 mg total) by mouth daily.  90 tablet  3  . nitroGLYCERIN (NITROSTAT) 0.4 MG SL tablet Place 1 tablet (0.4 mg total) under the tongue every 5 (five) minutes as needed for chest pain (up to 3 doses).  25 tablet  1  . pantoprazole (PROTONIX) 40 MG tablet Take 1 tablet (40 mg total) by mouth at bedtime.  30 tablet  1  . sertraline (ZOLOFT) 50 MG tablet Take 50 mg by mouth every morning.       .  warfarin (COUMADIN) 3 MG tablet Take 3 mg by mouth as directed. Take 2 tablets (6 mg) by mouth Tuesdays, Thursdays, Sundays. Take 1 and a half tablets (4.5 mg) all other days.       No current facility-administered medications for this visit.    Past Medical History  Diagnosis Date  . BPH (benign prostatic hypertrophy)   . Spinal stenosis     congential  . Hypertension   . Asthma   . DVT (deep venous thrombosis)     a. 2010 Lower ext s/p back surgery.  . Pulmonary embolism     a. 2010 in setting of DVT post-op back surgery. b. Low prob VQ 07/2013.  [name] PAF (paroxysmal atrial fibrillation)     a. Flecainide discontinued 07/2013 in setting of CAD.  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 COPD (chronic obstructive pulmonary disease)     a. 07/2013 PFT's mild airflow obstruction, no restriction, sev decrease in DLCO.  [name] GERD (gastroesophageal reflux disease)   . Diverticulosis   . Hx of echocardiogram 2015    Echo (06/2013): EF 60-65% normal wall motion, normal diastolic function, aortic sclerosis without stenosis, Trivial MR, mild SAM due to long, redundant mitral leaflets, mild RAE, normal RVSF  . Dyspnea on exertion     a. 07/2013 PFT's mild airflow obstruction, no restriction,  sev decrease in DLCO.  [name] CAD (coronary artery disease)     a. 07/2013: s/p DES to LAD, normal LVF.  . Macrocytic anemia     a. 07/2013: documented on prior labs.  . Obesity   . Transaminitis     a. 07/2013: mild.    Past Surgical History  Procedure Laterality Date  . Vasectomy    . Lumbar fusion      x2  . Total hip arthroplasty      bilat  . Cataracts    . Colonoscopy with polypectomy  2004  . Coronary stent placement  08/07/2013    DES to LAD    Dr [place]    ROS:  As stated in the HPI and negative for all other systems.  PHYSICAL EXAM BP 110/70  Pulse 76  Ht 6\' 2"  (1.88 m)  Wt 275 lb 8 oz (124.966 kg)  BMI 35.36 kg/m2 GENERAL:  Well appearing HEENT:  Pupils equal round and reactive, fundi not  visualized, oral mucosa unremarkable NECK:  No jugular venous distention, waveform within normal limits, carotid upstroke brisk and symmetric, no bruits, no thyromegaly LYMPHATICS:  No cervical, inguinal adenopathy LUNGS:  Clear to auscultation bilaterally BACK:  No CVA tenderness CHEST:  Unremarkable HEART:  PMI not displaced or sustained,S1 and S2 within normal limits, no S3, no S4, no clicks, no rubs, no murmurs ABD:  Flat, positive bowel sounds normal in frequency in pitch, no bruits, no rebound, no guarding, no midline pulsatile mass, no hepatomegaly, no splenomegaly, obese EXT:  2 plus pulses throughout, no edema, no cyanosis no clubbing SKIN:  No rashes no nodules NEURO:  Cranial nerves II through XII grossly intact, motor grossly intact throughout PSYCH:  Cognitively intact, oriented to person place and time  EKG:  Sinus rhythm, rate 76, axis within normal limits, intervals within normal limits, no acute ST-T wave changes.  11/06/2013  ASSESSMENT AND PLAN  ATRIAL FIBRILLATION:  I don't think this is related to the current symptoms.  He is not interested in switching to a NOAC as I don't feel strongly about the need to change.  No change in therapy is indicated.  CAD:  The patient has no new sypmtoms.  No further cardiovascular testing is indicated.  We will continue with aggressive risk reduction and meds as listed.  DYSPNEA/DIZZINESS:  This is no longer as problematic. No change in therapy is indicated. No further evaluation is planned.  HTN:  The blood pressure is at target. No change in medications is indicated. We will continue with therapeutic lifestyle changes (TLC).

## 2013-11-08 ENCOUNTER — Encounter (HOSPITAL_COMMUNITY): Payer: Medicare Other

## 2013-11-08 ENCOUNTER — Encounter (HOSPITAL_COMMUNITY)
Admission: RE | Admit: 2013-11-08 | Discharge: 2013-11-08 | Disposition: A | Discharge status: Home / Self Care | Payer: Medicare Other | Source: Ambulatory Visit | Attending: Cardiology | Admitting: Cardiology

## 2013-11-08 DIAGNOSIS — Z9861 Coronary angioplasty status: Secondary | ICD-10-CM | POA: Diagnosis not present

## 2013-11-10 ENCOUNTER — Encounter (HOSPITAL_COMMUNITY): Payer: Medicare Other

## 2013-11-13 ENCOUNTER — Encounter (HOSPITAL_COMMUNITY): Payer: Medicare Other

## 2013-11-15 ENCOUNTER — Encounter (HOSPITAL_COMMUNITY)
Admission: RE | Admit: 2013-11-15 | Discharge: 2013-11-15 | Disposition: A | Discharge status: Home / Self Care | Payer: Medicare Other | Source: Ambulatory Visit | Attending: Cardiology | Admitting: Cardiology

## 2013-11-15 ENCOUNTER — Encounter (HOSPITAL_COMMUNITY): Payer: Medicare Other

## 2013-11-15 DIAGNOSIS — Z9861 Coronary angioplasty status: Secondary | ICD-10-CM | POA: Diagnosis not present

## 2013-11-17 ENCOUNTER — Encounter (HOSPITAL_COMMUNITY)
Admission: RE | Admit: 2013-11-17 | Discharge: 2013-11-17 | Disposition: A | Discharge status: Home / Self Care | Payer: Medicare Other | Source: Ambulatory Visit | Attending: Cardiology | Admitting: Cardiology

## 2013-11-17 ENCOUNTER — Encounter (HOSPITAL_COMMUNITY): Payer: Medicare Other

## 2013-11-17 DIAGNOSIS — Z9861 Coronary angioplasty status: Secondary | ICD-10-CM | POA: Diagnosis not present

## 2013-11-20 ENCOUNTER — Encounter (HOSPITAL_COMMUNITY): Payer: Medicare Other

## 2013-11-22 ENCOUNTER — Encounter (HOSPITAL_COMMUNITY): Payer: Medicare Other

## 2013-11-24 ENCOUNTER — Encounter (HOSPITAL_COMMUNITY): Payer: Medicare Other

## 2013-11-27 ENCOUNTER — Encounter (HOSPITAL_COMMUNITY): Payer: Medicare Other

## 2013-11-28 DIAGNOSIS — I10 Essential (primary) hypertension: Secondary | ICD-10-CM | POA: Diagnosis not present

## 2013-11-28 DIAGNOSIS — R0602 Shortness of breath: Secondary | ICD-10-CM | POA: Diagnosis not present

## 2013-11-28 DIAGNOSIS — I498 Other specified cardiac arrhythmias: Secondary | ICD-10-CM | POA: Diagnosis not present

## 2013-11-28 DIAGNOSIS — Z86711 Personal history of pulmonary embolism: Secondary | ICD-10-CM | POA: Diagnosis not present

## 2013-11-28 DIAGNOSIS — J4489 Other specified chronic obstructive pulmonary disease: Secondary | ICD-10-CM | POA: Diagnosis not present

## 2013-11-28 DIAGNOSIS — R079 Chest pain, unspecified: Secondary | ICD-10-CM | POA: Diagnosis not present

## 2013-11-28 DIAGNOSIS — Z86718 Personal history of other venous thrombosis and embolism: Secondary | ICD-10-CM | POA: Diagnosis not present

## 2013-11-28 DIAGNOSIS — E785 Hyperlipidemia, unspecified: Secondary | ICD-10-CM | POA: Diagnosis not present

## 2013-11-28 DIAGNOSIS — Z96649 Presence of unspecified artificial hip joint: Secondary | ICD-10-CM | POA: Diagnosis not present

## 2013-11-28 DIAGNOSIS — Z981 Arthrodesis status: Secondary | ICD-10-CM | POA: Diagnosis not present

## 2013-11-28 DIAGNOSIS — I059 Rheumatic mitral valve disease, unspecified: Secondary | ICD-10-CM | POA: Diagnosis present

## 2013-11-28 DIAGNOSIS — Z9861 Coronary angioplasty status: Secondary | ICD-10-CM | POA: Diagnosis not present

## 2013-11-28 DIAGNOSIS — R0609 Other forms of dyspnea: Secondary | ICD-10-CM | POA: Diagnosis not present

## 2013-11-28 DIAGNOSIS — I951 Orthostatic hypotension: Secondary | ICD-10-CM | POA: Diagnosis not present

## 2013-11-28 DIAGNOSIS — R9431 Abnormal electrocardiogram [ECG] [EKG]: Secondary | ICD-10-CM | POA: Diagnosis not present

## 2013-11-28 DIAGNOSIS — R42 Dizziness and giddiness: Secondary | ICD-10-CM | POA: Diagnosis not present

## 2013-11-28 DIAGNOSIS — I440 Atrioventricular block, first degree: Secondary | ICD-10-CM | POA: Diagnosis not present

## 2013-11-28 DIAGNOSIS — I2510 Atherosclerotic heart disease of native coronary artery without angina pectoris: Secondary | ICD-10-CM | POA: Diagnosis present

## 2013-11-28 DIAGNOSIS — J449 Chronic obstructive pulmonary disease, unspecified: Secondary | ICD-10-CM | POA: Diagnosis not present

## 2013-11-28 DIAGNOSIS — Z8249 Family history of ischemic heart disease and other diseases of the circulatory system: Secondary | ICD-10-CM | POA: Diagnosis not present

## 2013-11-28 DIAGNOSIS — I4891 Unspecified atrial fibrillation: Secondary | ICD-10-CM | POA: Diagnosis not present

## 2013-11-28 DIAGNOSIS — I428 Other cardiomyopathies: Secondary | ICD-10-CM | POA: Diagnosis present

## 2013-11-28 DIAGNOSIS — Z87891 Personal history of nicotine dependence: Secondary | ICD-10-CM | POA: Diagnosis not present

## 2013-11-29 ENCOUNTER — Other Ambulatory Visit: Payer: Self-pay | Admitting: Internal Medicine

## 2013-11-29 ENCOUNTER — Encounter (HOSPITAL_COMMUNITY): Payer: Medicare Other

## 2013-11-29 NOTE — Telephone Encounter (Signed)
OK X1;R X 1 

## 2013-12-01 ENCOUNTER — Encounter (HOSPITAL_COMMUNITY): Payer: Medicare Other

## 2013-12-04 ENCOUNTER — Encounter (HOSPITAL_COMMUNITY): Payer: Medicare Other

## 2013-12-06 ENCOUNTER — Encounter (HOSPITAL_COMMUNITY): Payer: Medicare Other

## 2013-12-07 ENCOUNTER — Telehealth (HOSPITAL_COMMUNITY): Payer: Self-pay | Admitting: *Deleted

## 2013-12-08 ENCOUNTER — Encounter (HOSPITAL_COMMUNITY): Payer: Medicare Other

## 2013-12-11 ENCOUNTER — Encounter: Payer: Self-pay | Admitting: Gastroenterology

## 2013-12-11 ENCOUNTER — Encounter (HOSPITAL_COMMUNITY): Payer: Medicare Other

## 2013-12-12 DIAGNOSIS — I4891 Unspecified atrial fibrillation: Secondary | ICD-10-CM | POA: Diagnosis not present

## 2013-12-12 DIAGNOSIS — Z9861 Coronary angioplasty status: Secondary | ICD-10-CM | POA: Diagnosis not present

## 2013-12-12 DIAGNOSIS — I2510 Atherosclerotic heart disease of native coronary artery without angina pectoris: Secondary | ICD-10-CM | POA: Diagnosis not present

## 2013-12-12 DIAGNOSIS — I10 Essential (primary) hypertension: Secondary | ICD-10-CM | POA: Diagnosis not present

## 2013-12-12 DIAGNOSIS — E785 Hyperlipidemia, unspecified: Secondary | ICD-10-CM | POA: Diagnosis not present

## 2013-12-12 DIAGNOSIS — R001 Bradycardia, unspecified: Secondary | ICD-10-CM | POA: Diagnosis not present

## 2013-12-13 ENCOUNTER — Encounter (HOSPITAL_COMMUNITY): Payer: Medicare Other

## 2013-12-15 ENCOUNTER — Encounter (HOSPITAL_COMMUNITY): Payer: Medicare Other

## 2013-12-18 ENCOUNTER — Ambulatory Visit (HOSPITAL_COMMUNITY): Payer: Medicare Other

## 2013-12-20 ENCOUNTER — Ambulatory Visit (HOSPITAL_COMMUNITY): Payer: Medicare Other

## 2013-12-22 ENCOUNTER — Ambulatory Visit: Payer: Medicare Other | Admitting: Pharmacist Clinician (PhC)/ Clinical Pharmacy Specialist

## 2013-12-22 ENCOUNTER — Ambulatory Visit: Payer: Medicare Other | Admitting: Cardiology

## 2013-12-22 ENCOUNTER — Ambulatory Visit (HOSPITAL_COMMUNITY): Payer: Medicare Other

## 2013-12-25 ENCOUNTER — Ambulatory Visit (HOSPITAL_COMMUNITY): Payer: Medicare Other

## 2013-12-27 ENCOUNTER — Ambulatory Visit (HOSPITAL_COMMUNITY): Payer: Medicare Other

## 2013-12-29 ENCOUNTER — Ambulatory Visit (HOSPITAL_COMMUNITY): Payer: Medicare Other

## 2013-12-29 DIAGNOSIS — I4891 Unspecified atrial fibrillation: Secondary | ICD-10-CM | POA: Diagnosis not present

## 2013-12-29 DIAGNOSIS — I10 Essential (primary) hypertension: Secondary | ICD-10-CM | POA: Diagnosis not present

## 2013-12-29 DIAGNOSIS — E785 Hyperlipidemia, unspecified: Secondary | ICD-10-CM | POA: Diagnosis not present

## 2013-12-29 DIAGNOSIS — I2510 Atherosclerotic heart disease of native coronary artery without angina pectoris: Secondary | ICD-10-CM | POA: Diagnosis not present

## 2013-12-29 DIAGNOSIS — Z9861 Coronary angioplasty status: Secondary | ICD-10-CM | POA: Diagnosis not present

## 2014-01-01 ENCOUNTER — Ambulatory Visit (HOSPITAL_COMMUNITY): Payer: Medicare Other

## 2014-01-03 ENCOUNTER — Ambulatory Visit (HOSPITAL_COMMUNITY): Payer: Medicare Other

## 2014-01-04 ENCOUNTER — Encounter: Payer: Self-pay | Admitting: Internal Medicine

## 2014-01-04 ENCOUNTER — Ambulatory Visit (INDEPENDENT_AMBULATORY_CARE_PROVIDER_SITE_OTHER): Payer: Medicare Other | Admitting: Internal Medicine

## 2014-01-04 VITALS — BP 104/68 | HR 64 | Temp 98.2°F | Wt 263.0 lb

## 2014-01-04 DIAGNOSIS — J310 Chronic rhinitis: Secondary | ICD-10-CM

## 2014-01-04 DIAGNOSIS — Z23 Encounter for immunization: Secondary | ICD-10-CM | POA: Diagnosis not present

## 2014-01-04 DIAGNOSIS — I2510 Atherosclerotic heart disease of native coronary artery without angina pectoris: Secondary | ICD-10-CM

## 2014-01-04 DIAGNOSIS — J4520 Mild intermittent asthma, uncomplicated: Secondary | ICD-10-CM

## 2014-01-04 DIAGNOSIS — I480 Paroxysmal atrial fibrillation: Secondary | ICD-10-CM | POA: Diagnosis not present

## 2014-01-04 MED ORDER — HYDROCODONE-HOMATROPINE 5-1.5 MG/5ML PO SYRP: 5.0000 mL | ORAL_SOLUTION | Freq: Four times a day (QID) | ORAL | Status: DC | PRN

## 2014-01-04 NOTE — Patient Instructions (Addendum)
Plain Mucinex (NOT D) for thick secretions ;force NON dairy fluids .   Nasal cleansing in the shower as discussed with lather of mild shampoo.After 10 seconds wash off lather while  exhaling through nostrils. Make sure that all residual soap is removed to prevent irritation.  Flonase OR Nasacort AQ 1 spray in each nostril twice a day as needed. Use the "crossover" technique into opposite nostril spraying toward opposite ear @ 45 degree angle, not straight up into nostril.  Use a Neti pot daily only  as needed for significant sinus congestion; going from open side to congested side . Plain Allegra (NOT D )  160 daily , Loratidine 10 mg , OR Zyrtec 10 mg @ bedtime  as needed for itchy eyes & sneezing. Zicam Melts or Zinc lozenges as per package label for sore throat . Complementary options include  vitamin C 2000 mg daily; & Echinacea for 4-7 days. Report persistent or progressive fever; discolored nasal or chest secretions; or frontal headache or facial  pain.

## 2014-01-04 NOTE — Progress Notes (Signed)
Pre visit review using our clinic review tool, if applicable. No additional management support is needed unless otherwise documented below in the visit note. 

## 2014-01-04 NOTE — Progress Notes (Signed)
   Subjective:    Patient ID: [name] [name], male    DOB: 1945-09-12, 68 y.o.   MRN: [id number]  HPI   He was traveling back from [place], [place] 10/27 when he developed a hacking cough associated with post nasal drainage and sneezing. The cough is intermittent and can last 10 minutes.  He's had a minor sore throat.  He's had some chills without fever or sweats.  There's been low-grade wheezing.He is using Symbicort 2 puffs bid.  He denies other signs or symptoms of upper respiratory tract infection.  He required cardioversion for PAF @ the [facility] Frontal headache, facial pain , nasal purulence, dental pain, sore throat , otic pain or otic discharge denied. No fever , chills or sweats.       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 Heart sounds are somewhat distant. He has very low-grade scattered musical wheezing with no increased work of breathing.  General appearance:good health ;well nourished; no acute distress or increased work of breathing is present.  No  lymphadenopathy about the head, neck, or axilla noted.  Eyes: No conjunctival inflammation or lid edema is present. There is no scleral icterus. Ears:  External ear exam shows no significant lesions or deformities.  Otoscopic examination reveals clear canals, tympanic membranes are intact bilaterally without bulging, retraction, inflammation or discharge. Nose:  External nasal examination shows no deformity or inflammation. Nasal mucosa are pink and moist without lesions or exudates. No septal dislocation or deviation.No obstruction to airflow.  Oral exam: Dental hygiene is good; lips and gums are healthy appearing.There is no oropharyngeal erythema or exudate noted.  Neck:  No deformities, thyromegaly, masses, or tenderness noted.   Supple with full range of motion without pain.  Heart:  Normal rate and regular rhythm. S1 and S2  normal without gallop, murmur, click, rub or other extra sounds.  Extremities:  No cyanosis, edema, or clubbing  noted  Skin: Warm & dry w/o jaundice or tenting.         Assessment & Plan:  #1 nonallergic rhinitis  #2 nonallergic asthma with mild exacerbation  Plan: Aggressive nasal hygiene will be implemented along with continuation of the Symbicort. He is to report any signs of active infection.

## 2014-01-05 ENCOUNTER — Ambulatory Visit (INDEPENDENT_AMBULATORY_CARE_PROVIDER_SITE_OTHER): Payer: Medicare Other | Admitting: Cardiology

## 2014-01-05 ENCOUNTER — Ambulatory Visit (HOSPITAL_COMMUNITY): Payer: Medicare Other

## 2014-01-05 ENCOUNTER — Encounter: Payer: Self-pay | Admitting: Internal Medicine

## 2014-01-05 ENCOUNTER — Other Ambulatory Visit: Payer: Self-pay | Admitting: *Deleted

## 2014-01-05 ENCOUNTER — Ambulatory Visit (INDEPENDENT_AMBULATORY_CARE_PROVIDER_SITE_OTHER): Payer: Medicare Other | Admitting: Pharmacist Clinician (PhC)/ Clinical Pharmacy Specialist

## 2014-01-05 ENCOUNTER — Encounter: Payer: Self-pay | Admitting: Cardiology

## 2014-01-05 VITALS — BP 108/70 | HR 56 | Ht 74.0 in | Wt 264.2 lb

## 2014-01-05 DIAGNOSIS — I480 Paroxysmal atrial fibrillation: Secondary | ICD-10-CM

## 2014-01-05 DIAGNOSIS — I4891 Unspecified atrial fibrillation: Secondary | ICD-10-CM | POA: Diagnosis not present

## 2014-01-05 DIAGNOSIS — I2510 Atherosclerotic heart disease of native coronary artery without angina pectoris: Secondary | ICD-10-CM | POA: Diagnosis not present

## 2014-01-05 DIAGNOSIS — Z86718 Personal history of other venous thrombosis and embolism: Secondary | ICD-10-CM

## 2014-01-05 LAB — POCT INR: INR: 2.9

## 2014-01-05 MED ORDER — SOTALOL HCL 80 MG PO TABS: 80.0000 mg | ORAL_TABLET | Freq: Two times a day (BID) | ORAL | Status: DC

## 2014-01-05 NOTE — Progress Notes (Signed)
HPI  The patient presents for follow up of atrial fibrillation, HTN, prior DVT and pulmonary emboli and CAD.   Since he was last seen he was in [place] and went into atrial fib.  He was hospitalized at the [facility] and I was able to review records on [place].  He did have a nuclear stress test which was negative.  He was started on Sotalol but his QTc increased on 120 mg BID.  He was discharged on 80 mg BID.  He did require cardioversion.  Of note an echocardiogram did demonstrate MR.  He did have some bradycardia and orthostasis during that hospitalization.    Since coming home he has done quite well.  The patient denies any new symptoms such as chest discomfort, neck or arm discomfort. There has been no new shortness of breath, PND or orthopnea. There have been no reported palpitations, presyncope or syncope.  He is not having any of the palpitations that was his recent symptom indicating fibrillation.  He denies any chest pain.  Neck or arm pain. He has had on weight gain or edema.  He has had no presyncope.  Of note he was also screened for septal hypertrophy as there was some of this with SAM noted on the echo.  However, MRI did not confirm this.      Allergies  Allergen Reactions  . Penicillins Anaphylaxis     Because of a history of documented adverse serious drug reaction;Medi Alert bracelet  is recommended    Current Outpatient Prescriptions  Medication Sig Dispense Refill  . atorvastatin (LIPITOR) 10 MG tablet Take 1 tablet (10 mg total) by mouth every evening.  90 tablet  3  . atorvastatin (LIPITOR) 10 MG tablet Take by mouth.      . budesonide-formoterol (SYMBICORT) 160-4.5 MCG/ACT inhaler Inhale 2 puffs into the lungs 2 (two) times daily.  3 Inhaler  3  . budesonide-formoterol (SYMBICORT) 160-4.5 MCG/ACT inhaler Inhale into the lungs.      . clopidogrel (PLAVIX) 75 MG tablet Take 1 tablet (75 mg total) by mouth daily with breakfast.  90 tablet  3  .  clopidogrel (PLAVIX) 75 MG tablet Take by mouth.      [name] HYDROcodone-homatropine (HYDROMET) 5-1.5 MG/5ML syrup Take 5 mLs by mouth every 6 (six) hours as needed for cough.  120 mL  0  . LORazepam (ATIVAN) 1 MG tablet 1/2-1 qd prn only  30 tablet  1  . nitroGLYCERIN (NITROSTAT) 0.4 MG SL tablet Place 1 tablet (0.4 mg total) under the tongue every 5 (five) minutes as needed for chest pain (up to 3 doses).  25 tablet  1  . pantoprazole (PROTONIX) 40 MG tablet Take by mouth.      . sertraline (ZOLOFT) 50 MG tablet TAKE 1 TABLET DAILY  90 tablet  1  . sotalol (BETAPACE) 80 MG tablet Take 80 mg by mouth 2 (two) times daily.      . sotalol (BETAPACE) 80 MG tablet Take by mouth.      . warfarin (COUMADIN) 3 MG tablet Take 3 mg by mouth as directed. Take 2 tablets (6 mg) by mouth Tuesdays, Thursdays, Sundays. Take 1 and a half tablets (4.5 mg) all other days.       No current facility-administered medications for this visit.    Past Medical History  Diagnosis Date  . BPH (benign prostatic hypertrophy)   . Spinal stenosis     congential  . Hypertension   .  Asthma   . DVT (deep venous thrombosis)     a. 2010 Lower ext s/p back surgery.  . Pulmonary embolism     a. 2010 in setting of DVT post-op back surgery. b. Low prob VQ 07/2013.  [name] PAF (paroxysmal atrial fibrillation)     a. Flecainide discontinued 07/2013 in setting of CAD.  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 COPD (chronic obstructive pulmonary disease)     a. 07/2013 PFT's mild airflow obstruction, no restriction, sev decrease in DLCO.  [name] GERD (gastroesophageal reflux disease)   . Diverticulosis   . Hx of echocardiogram 2015    Echo (06/2013): EF 60-65% normal wall motion, normal diastolic function, aortic sclerosis without stenosis, Trivial MR, mild SAM due to long, redundant mitral leaflets, mild RAE, normal RVSF  . Dyspnea on exertion     a. 07/2013 PFT's mild airflow obstruction, no restriction, sev decrease in DLCO.  [name] CAD (coronary  artery disease)     a. 07/2013: s/p DES to LAD, normal LVF.  . Macrocytic anemia     a. 07/2013: documented on prior labs.  . Obesity   . Transaminitis     a. 07/2013: mild.    Past Surgical History  Procedure Laterality Date  . Vasectomy    . Lumbar fusion      x2  . Total hip arthroplasty      bilat  . Cataracts    . Colonoscopy with polypectomy  2004  . Coronary stent placement  08/07/2013    DES to LAD    Dr [place]    ROS:  As stated in the HPI and negative for all other systems.  PHYSICAL EXAM BP 108/70  Pulse 56  Ht 6\' 2"  (1.88 m)  Wt 264 lb 3.2 oz (119.84 kg)  BMI 33.91 kg/m2 GENERAL:  Well appearing HEENT:  Pupils equal round and reactive, fundi not visualized, oral mucosa unremarkable NECK:  No jugular venous distention, waveform within normal limits, carotid upstroke brisk and symmetric, no bruits, no thyromegaly LYMPHATICS:  No cervical, inguinal adenopathy LUNGS:  Clear to auscultation bilaterally BACK:  No CVA tenderness CHEST:  Unremarkable HEART:  PMI not displaced or sustained,S1 and S2 within normal limits, no S3, no S4, no clicks, no rubs, no murmurs ABD:  Flat, positive bowel sounds normal in frequency in pitch, no bruits, no rebound, no guarding, no midline pulsatile mass, no hepatomegaly, no splenomegaly, obese EXT:  2 plus pulses throughout, no edema, no cyanosis no clubbing SKIN:  No rashes no nodules NEURO:  Cranial nerves II through XII grossly intact, motor grossly intact throughout PSYCH:  Cognitively intact, oriented to person place and time  EKG:  Sinus rhythm, rate 56, axis within normal limits, intervals within normal limits, no acute ST-T wave changes.  01/05/2014  ASSESSMENT AND PLAN  ATRIAL FIBRILLATION:  He is maintaining NSR.  He will remain on the meds as listed.  Of note his QTc is OK.  He had his warfarin checked and he is therapeutic.  He has no desire to change to a NOAC.   CAD:  The patient has no new sypmtoms.  No further  cardiovascular testing is indicated.  We will continue with aggressive risk reduction and meds as listed.  I did review the stress perfusion results from [facility] and there was no evidence of ischemia.  HTN:  The blood pressure is at target. No change in medications is indicated. We will continue with therapeutic lifestyle changes (TLC).  MR:  He  had some moderate MR and mild septal hypertrophy.  I will follow this clinically and with repeat echocardiography.  No further work up is indicated at this point.     (Greater than 75minutes reviewing all data with greater than 50% face to face with the patient).

## 2014-01-05 NOTE — Patient Instructions (Signed)
Your physician recommends that you schedule a follow-up appointment in: Three Months with Dr. Percival Spanish

## 2014-01-08 ENCOUNTER — Ambulatory Visit (HOSPITAL_COMMUNITY): Payer: Medicare Other

## 2014-01-10 ENCOUNTER — Ambulatory Visit (INDEPENDENT_AMBULATORY_CARE_PROVIDER_SITE_OTHER): Payer: Medicare Other | Admitting: Internal Medicine

## 2014-01-10 ENCOUNTER — Ambulatory Visit (HOSPITAL_COMMUNITY): Payer: Medicare Other

## 2014-01-10 ENCOUNTER — Other Ambulatory Visit (INDEPENDENT_AMBULATORY_CARE_PROVIDER_SITE_OTHER): Payer: Medicare Other

## 2014-01-10 ENCOUNTER — Other Ambulatory Visit: Payer: Self-pay | Admitting: Internal Medicine

## 2014-01-10 ENCOUNTER — Ambulatory Visit (INDEPENDENT_AMBULATORY_CARE_PROVIDER_SITE_OTHER)
Admission: RE | Admit: 2014-01-10 | Discharge: 2014-01-10 | Disposition: A | Discharge status: Home / Self Care | Payer: Medicare Other | Source: Ambulatory Visit | Attending: Internal Medicine | Admitting: Internal Medicine

## 2014-01-10 ENCOUNTER — Encounter: Payer: Self-pay | Admitting: Internal Medicine

## 2014-01-10 VITALS — BP 110/60 | HR 57 | Temp 98.0°F | Wt 264.1 lb

## 2014-01-10 DIAGNOSIS — R05 Cough: Secondary | ICD-10-CM | POA: Diagnosis not present

## 2014-01-10 DIAGNOSIS — J209 Acute bronchitis, unspecified: Secondary | ICD-10-CM

## 2014-01-10 DIAGNOSIS — J069 Acute upper respiratory infection, unspecified: Secondary | ICD-10-CM

## 2014-01-10 DIAGNOSIS — R0989 Other specified symptoms and signs involving the circulatory and respiratory systems: Secondary | ICD-10-CM | POA: Diagnosis not present

## 2014-01-10 DIAGNOSIS — I2510 Atherosclerotic heart disease of native coronary artery without angina pectoris: Secondary | ICD-10-CM | POA: Diagnosis not present

## 2014-01-10 DIAGNOSIS — R0602 Shortness of breath: Secondary | ICD-10-CM | POA: Diagnosis not present

## 2014-01-10 DIAGNOSIS — J984 Other disorders of lung: Secondary | ICD-10-CM | POA: Diagnosis not present

## 2014-01-10 LAB — CBC WITH DIFFERENTIAL/PLATELET
BASOS ABS: 0.1 10*3/uL (ref 0.0–0.1)
Basophils Relative: 0.7 % (ref 0.0–3.0)
EOS ABS: 0.3 10*3/uL (ref 0.0–0.7)
Eosinophils Relative: 2.8 % (ref 0.0–5.0)
HEMATOCRIT: 38.9 % — AB (ref 39.0–52.0)
HEMOGLOBIN: 12.9 g/dL — AB (ref 13.0–17.0)
LYMPHS ABS: 3.4 10*3/uL (ref 0.7–4.0)
Lymphocytes Relative: 31 % (ref 12.0–46.0)
MCHC: 33.2 g/dL (ref 30.0–36.0)
MCV: 100.5 fl — AB (ref 78.0–100.0)
Monocytes Absolute: 0.9 10*3/uL (ref 0.1–1.0)
Monocytes Relative: 8 % (ref 3.0–12.0)
Neutro Abs: 6.4 10*3/uL (ref 1.4–7.7)
Neutrophils Relative %: 57.5 % (ref 43.0–77.0)
Platelets: 471 10*3/uL — ABNORMAL HIGH (ref 150.0–400.0)
RBC: 3.87 Mil/uL — AB (ref 4.22–5.81)
RDW: 16 % — AB (ref 11.5–15.5)
WBC: 11.1 10*3/uL — ABNORMAL HIGH (ref 4.0–10.5)

## 2014-01-10 MED ORDER — SULFAMETHOXAZOLE-TRIMETHOPRIM 800-160 MG PO TABS: 1.0000 | ORAL_TABLET | Freq: Two times a day (BID) | ORAL | Status: DC

## 2014-01-10 MED ORDER — HYDROCODONE-HOMATROPINE 5-1.5 MG/5ML PO SYRP: 5.0000 mL | ORAL_SOLUTION | Freq: Four times a day (QID) | ORAL | Status: DC | PRN

## 2014-01-10 MED ORDER — PREDNISONE 20 MG PO TABS: 20.0000 mg | ORAL_TABLET | Freq: Two times a day (BID) | ORAL | Status: DC

## 2014-01-10 NOTE — Progress Notes (Signed)
   Subjective:    Patient ID: [name] [name], male    DOB: [birth year], 68 y.o.   MRN: [id number]  HPI   He has also completed the entire course of cough syrup but continues to have paroxysms of coughing at night. This is associated with profound sweating and the production of thick, cloudy-yellow secretions. He is also felt hot but has not documented a fever  He describes some yellow nasal secretions of equal volume as well.  He describes constant rhinorrhea. He has no other upper respiratory tract symptoms  His white count was 11,100 with no left shift. He did exhibit a mild anemia with hematocrit of 38.9.  Chest x-ray revealed no active process. There was increased scarring at the left base as well as degenerative disc disease.    Review of Systems Frontal headache, facial pain , dental pain, sore throat , otic pain or otic discharge denied.      Objective:   Physical Exam   Positive or pertinent findings include: Ptosis OD > OS He does have some asymmetry of rhonchi which are low-grade. This is more predominant on the right Heart sounds are somewhat distant.  General appearance:good health ;well nourished; no acute distress or increased work of breathing is present.  No  lymphadenopathy about the head, neck, or axilla noted.   Eyes: No conjunctival inflammation or lid edema is present. There is no scleral icterus.  Ears:  External ear exam shows no significant lesions or deformities.  Otoscopic examination reveals clear canals, tympanic membranes are intact bilaterally without bulging, retraction, inflammation or discharge.  Nose:  External nasal examination shows no deformity or inflammation. Nasal mucosa are pink and moist without lesions or exudates. No septal dislocation or deviation.No obstruction to airflow.   Oral exam: Dental hygiene is good; lips and gums are healthy appearing.There is no oropharyngeal erythema or exudate noted.   Neck:  No deformities, thyromegaly,  masses, or tenderness noted.    Heart:  Normal rate and regular rhythm. S1 and S2 normal without gallop, murmur, click, rub or other extra sounds.   Lungs:No increased work of breathing.    Extremities:  No cyanosis or clubbing  noted . Trace edema   Skin: Warm & dry w/o jaundice or tenting.           Assessment & Plan:  #1 acute bronchitis  #2 purulent nasal secretions  Plan: See orders and recommendations

## 2014-01-10 NOTE — Patient Instructions (Signed)
PT/INR was therapeutic 10/30. No change in warfarin dose; repeat PT/INR 11/9 due to antibiotic

## 2014-01-10 NOTE — Progress Notes (Signed)
Pre visit review using our clinic review tool, if applicable. No additional management support is needed unless otherwise documented below in the visit note. 

## 2014-01-12 ENCOUNTER — Ambulatory Visit (HOSPITAL_COMMUNITY): Payer: Medicare Other

## 2014-01-13 ENCOUNTER — Other Ambulatory Visit: Payer: Self-pay | Admitting: Internal Medicine

## 2014-01-15 ENCOUNTER — Telehealth: Payer: Self-pay | Admitting: Internal Medicine

## 2014-01-15 ENCOUNTER — Other Ambulatory Visit (INDEPENDENT_AMBULATORY_CARE_PROVIDER_SITE_OTHER): Payer: Medicare Other

## 2014-01-15 DIAGNOSIS — Z86718 Personal history of other venous thrombosis and embolism: Secondary | ICD-10-CM | POA: Diagnosis not present

## 2014-01-15 LAB — PROTIME-INR
INR: 4.7 ratio — AB (ref 0.8–1.0)
Prothrombin Time: 50.3 s — ABNORMAL HIGH (ref 9.6–13.1)

## 2014-01-15 NOTE — Telephone Encounter (Signed)
Lorazepam has been faxed to [facility]

## 2014-01-15 NOTE — Telephone Encounter (Signed)
OKX1 prn as written; not maintenance

## 2014-01-15 NOTE — Telephone Encounter (Signed)
Order placed

## 2014-01-15 NOTE — Telephone Encounter (Signed)
Patient needs INR check.  Can you enter order to be completed at lab?

## 2014-01-15 NOTE — Telephone Encounter (Signed)
Spoke with patients wife.  Spouse will come in.

## 2014-01-16 ENCOUNTER — Other Ambulatory Visit: Payer: Self-pay | Admitting: Internal Medicine

## 2014-01-16 DIAGNOSIS — Z86711 Personal history of pulmonary embolism: Secondary | ICD-10-CM

## 2014-01-22 ENCOUNTER — Encounter: Payer: Self-pay | Admitting: Internal Medicine

## 2014-01-22 ENCOUNTER — Other Ambulatory Visit (INDEPENDENT_AMBULATORY_CARE_PROVIDER_SITE_OTHER): Payer: Medicare Other

## 2014-01-22 ENCOUNTER — Ambulatory Visit (INDEPENDENT_AMBULATORY_CARE_PROVIDER_SITE_OTHER): Payer: Medicare Other | Admitting: Internal Medicine

## 2014-01-22 ENCOUNTER — Telehealth: Payer: Self-pay | Admitting: Internal Medicine

## 2014-01-22 VITALS — BP 120/74 | HR 71 | Temp 97.7°F | Wt 264.0 lb

## 2014-01-22 DIAGNOSIS — I2510 Atherosclerotic heart disease of native coronary artery without angina pectoris: Secondary | ICD-10-CM | POA: Diagnosis not present

## 2014-01-22 DIAGNOSIS — J42 Unspecified chronic bronchitis: Secondary | ICD-10-CM

## 2014-01-22 DIAGNOSIS — Z86711 Personal history of pulmonary embolism: Secondary | ICD-10-CM

## 2014-01-22 LAB — PROTIME-INR
INR: 3.2 ratio — ABNORMAL HIGH (ref 0.8–1.0)
PROTHROMBIN TIME: 34.9 s — AB (ref 9.6–13.1)

## 2014-01-22 MED ORDER — UMECLIDINIUM-VILANTEROL 62.5-25 MCG/INH IN AEPB: 1.0000 | INHALATION_SPRAY | Freq: Every morning | RESPIRATORY_TRACT | Status: DC

## 2014-01-22 NOTE — Progress Notes (Signed)
   Subjective:    Patient ID: [name] [name], male    DOB: 1945-05-30, 68 y.o.   MRN: [id number]  HPI    His bronchitic symptoms and upper respiratory tract symptoms have resolved except for some mild rhinitis. The nasal secretions are clear.  He describes fatigue which is really exertional dyspnea. This been present for years and is essentially @ his baseline  He previously had been in pulmonary rehabilitation at the [facility] as well as in this area  He has been using the Symbicort. Has completed a course of steroids   Review of Systems Frontal headache, facial pain , nasal purulence, dental pain, sore throat , otic pain or otic discharge denied. No fever , chills or sweats. Extrinsic symptoms of itchy, watery eyes, sneezing, or angioedema are denied.  There is no significant cough, sputum production, wheezing,or  paroxysmal nocturnal dyspnea.     Objective:   Physical Exam   Positive or pertinent findings include: There is a  distant S4 Breath sounds are generally decreased and distant. There is no increased work of breathing.  General appearance:good health ;well nourished; no acute distress or increased work of breathing is present.  No  lymphadenopathy about the head, neck, or axilla noted.  Eyes: No conjunctival inflammation or lid edema is present. There is no scleral icterus. Ears:  External ear exam shows no significant lesions or deformities.  Otoscopic examination reveals clear canals, tympanic membranes are intact bilaterally without bulging, retraction, inflammation or discharge. Nose:  External nasal examination shows no deformity or inflammation. Nasal mucosa are pink and moist without lesions or exudates. No septal dislocation or deviation.No obstruction to airflow.  Oral exam: Dental hygiene is good; lips and gums are healthy appearing.There is no oropharyngeal erythema or exudate noted.  Neck:  No deformities, thyromegaly, masses, or tenderness noted.      Heart:  Normal rate and regular rhythm. S1 and S2 normal without gallop, murmur, click, rub or other extra sounds.  Lungs:Chest clear to auscultation; no wheezes, rhonchi,rales ,or rubs present.No increased work of breathing.   Extremities:  No cyanosis, edema, or clubbing  noted  Skin: Warm & dry w/o jaundice or tenting.         Assessment & Plan:  #1 fatigue which is really exertional dyspnea in the context of advanced COPD. He does have an asthmatic component which is not active at this time  Plan: A trial of Anoro will be initiated

## 2014-01-22 NOTE — Progress Notes (Signed)
Pre visit review using our clinic review tool, if applicable. No additional management support is needed unless otherwise documented below in the visit note. 

## 2014-01-22 NOTE — Telephone Encounter (Signed)
Pt called in and dr hopper what pt to get his coum checked  Today.  Can order be put in to go to lab and get it checked

## 2014-01-22 NOTE — Telephone Encounter (Signed)
Lab order was placed 01/16/14

## 2014-01-22 NOTE — Patient Instructions (Addendum)
Anoro one inhalation daily; gargle and spit after use. Use @ least 30 minutes  after Symbicort in am

## 2014-02-01 ENCOUNTER — Telehealth: Payer: Self-pay | Admitting: Physician Assistant

## 2014-02-01 NOTE — Telephone Encounter (Signed)
     [name] [name] called the after hours phone line to report some slight dizziness and weakness. He took his BP and it was 105/64, which is low for him. He was worried and wanted to know what to do. Patient denied CP or SOB and not dizzy when walking like when he recently had afib. No palpitations, nausea, fevers or chills. He has not passed out. I advised him to take it easy and continue to monitor closely. I told him to hold off on any PM BP medications. If he worsens or feels like he may pass out he knows to come to the ED. He feels stable for now.     Angelena Form PA-C  [facility]

## 2014-02-05 ENCOUNTER — Telehealth: Payer: Self-pay

## 2014-02-05 NOTE — Telephone Encounter (Signed)
Patient's wife is requesting a home health aid.

## 2014-02-05 NOTE — Telephone Encounter (Signed)
I recommend  Physical Therapy consultation to determine optimal therapy & needs

## 2014-02-06 NOTE — Telephone Encounter (Signed)
Patient's wife has been advised.

## 2014-02-08 ENCOUNTER — Ambulatory Visit: Payer: Medicare Other

## 2014-02-08 ENCOUNTER — Encounter (HOSPITAL_COMMUNITY): Payer: Self-pay | Admitting: Cardiology

## 2014-02-08 ENCOUNTER — Ambulatory Visit (INDEPENDENT_AMBULATORY_CARE_PROVIDER_SITE_OTHER): Payer: Medicare Other | Admitting: Pharmacist Clinician (PhC)/ Clinical Pharmacy Specialist

## 2014-02-08 ENCOUNTER — Telehealth: Payer: Self-pay | Admitting: Cardiology

## 2014-02-08 ENCOUNTER — Observation Stay (HOSPITAL_COMMUNITY)
Admission: EM | Admit: 2014-02-08 | Discharge: 2014-02-09 | Disposition: A | Discharge status: Home / Self Care | Payer: Medicare Other | Attending: Cardiology | Admitting: Cardiology

## 2014-02-08 ENCOUNTER — Emergency Department (HOSPITAL_COMMUNITY): Payer: Medicare Other

## 2014-02-08 DIAGNOSIS — D539 Nutritional anemia, unspecified: Secondary | ICD-10-CM | POA: Insufficient documentation

## 2014-02-08 DIAGNOSIS — R404 Transient alteration of awareness: Secondary | ICD-10-CM | POA: Diagnosis not present

## 2014-02-08 DIAGNOSIS — Z955 Presence of coronary angioplasty implant and graft: Secondary | ICD-10-CM | POA: Insufficient documentation

## 2014-02-08 DIAGNOSIS — Z86711 Personal history of pulmonary embolism: Secondary | ICD-10-CM | POA: Diagnosis not present

## 2014-02-08 DIAGNOSIS — Y998 Other external cause status: Secondary | ICD-10-CM | POA: Diagnosis not present

## 2014-02-08 DIAGNOSIS — E669 Obesity, unspecified: Secondary | ICD-10-CM | POA: Insufficient documentation

## 2014-02-08 DIAGNOSIS — R001 Bradycardia, unspecified: Secondary | ICD-10-CM | POA: Diagnosis not present

## 2014-02-08 DIAGNOSIS — S52552A Other extraarticular fracture of lower end of left radius, initial encounter for closed fracture: Secondary | ICD-10-CM | POA: Diagnosis not present

## 2014-02-08 DIAGNOSIS — S62102A Fracture of unspecified carpal bone, left wrist, initial encounter for closed fracture: Secondary | ICD-10-CM | POA: Insufficient documentation

## 2014-02-08 DIAGNOSIS — Z86718 Personal history of other venous thrombosis and embolism: Secondary | ICD-10-CM

## 2014-02-08 DIAGNOSIS — Z6834 Body mass index (BMI) 34.0-34.9, adult: Secondary | ICD-10-CM | POA: Insufficient documentation

## 2014-02-08 DIAGNOSIS — Y9389 Activity, other specified: Secondary | ICD-10-CM | POA: Diagnosis not present

## 2014-02-08 DIAGNOSIS — Z880 Allergy status to penicillin: Secondary | ICD-10-CM | POA: Diagnosis not present

## 2014-02-08 DIAGNOSIS — I480 Paroxysmal atrial fibrillation: Secondary | ICD-10-CM | POA: Insufficient documentation

## 2014-02-08 DIAGNOSIS — W010XXA Fall on same level from slipping, tripping and stumbling without subsequent striking against object, initial encounter: Secondary | ICD-10-CM | POA: Diagnosis not present

## 2014-02-08 DIAGNOSIS — I4891 Unspecified atrial fibrillation: Secondary | ICD-10-CM

## 2014-02-08 DIAGNOSIS — J441 Chronic obstructive pulmonary disease with (acute) exacerbation: Secondary | ICD-10-CM | POA: Diagnosis not present

## 2014-02-08 DIAGNOSIS — Y9289 Other specified places as the place of occurrence of the external cause: Secondary | ICD-10-CM | POA: Diagnosis not present

## 2014-02-08 DIAGNOSIS — J45909 Unspecified asthma, uncomplicated: Secondary | ICD-10-CM | POA: Diagnosis not present

## 2014-02-08 DIAGNOSIS — K5120 Ulcerative (chronic) proctitis without complications: Secondary | ICD-10-CM | POA: Diagnosis not present

## 2014-02-08 DIAGNOSIS — Z7901 Long term (current) use of anticoagulants: Secondary | ICD-10-CM | POA: Diagnosis not present

## 2014-02-08 DIAGNOSIS — K219 Gastro-esophageal reflux disease without esophagitis: Secondary | ICD-10-CM | POA: Diagnosis not present

## 2014-02-08 DIAGNOSIS — R42 Dizziness and giddiness: Secondary | ICD-10-CM

## 2014-02-08 DIAGNOSIS — Z87891 Personal history of nicotine dependence: Secondary | ICD-10-CM | POA: Diagnosis not present

## 2014-02-08 DIAGNOSIS — N400 Enlarged prostate without lower urinary tract symptoms: Secondary | ICD-10-CM | POA: Insufficient documentation

## 2014-02-08 DIAGNOSIS — R0602 Shortness of breath: Secondary | ICD-10-CM | POA: Diagnosis not present

## 2014-02-08 DIAGNOSIS — R0600 Dyspnea, unspecified: Secondary | ICD-10-CM | POA: Diagnosis not present

## 2014-02-08 DIAGNOSIS — R55 Syncope and collapse: Principal | ICD-10-CM | POA: Insufficient documentation

## 2014-02-08 DIAGNOSIS — I2510 Atherosclerotic heart disease of native coronary artery without angina pectoris: Secondary | ICD-10-CM | POA: Diagnosis not present

## 2014-02-08 DIAGNOSIS — I10 Essential (primary) hypertension: Secondary | ICD-10-CM | POA: Insufficient documentation

## 2014-02-08 DIAGNOSIS — R0609 Other forms of dyspnea: Secondary | ICD-10-CM

## 2014-02-08 DIAGNOSIS — J449 Chronic obstructive pulmonary disease, unspecified: Secondary | ICD-10-CM | POA: Insufficient documentation

## 2014-02-08 HISTORY — DX: Adverse effect of unspecified anesthetic, initial encounter: T41.45XA

## 2014-02-08 HISTORY — DX: Personal history of other diseases of the digestive system: Z87.19

## 2014-02-08 HISTORY — DX: Pneumonia, unspecified organism: J18.9

## 2014-02-08 HISTORY — DX: Other complications of anesthesia, initial encounter: T88.59XA

## 2014-02-08 HISTORY — DX: Personal history of other diseases of the musculoskeletal system and connective tissue: Z87.39

## 2014-02-08 HISTORY — DX: Unspecified osteoarthritis, unspecified site: M19.90

## 2014-02-08 LAB — BASIC METABOLIC PANEL
Anion gap: 15 (ref 5–15)
BUN: 15 mg/dL (ref 6–23)
CHLORIDE: 102 meq/L (ref 96–112)
CO2: 22 meq/L (ref 19–32)
Calcium: 9 mg/dL (ref 8.4–10.5)
Creatinine, Ser: 0.91 mg/dL (ref 0.50–1.35)
GFR calc Af Amer: >90 mL/min (ref >90)
GFR, EST NON AFRICAN AMERICAN: 84 mL/min — AB (ref >90)
GLUCOSE: 88 mg/dL (ref 70–99)
POTASSIUM: 4 meq/L (ref 3.7–5.3)
SODIUM: 139 meq/L (ref 137–147)

## 2014-02-08 LAB — CBC
HCT: 33.8 % — ABNORMAL LOW (ref 39.0–52.0)
HEMOGLOBIN: 11.1 g/dL — AB (ref 13.0–17.0)
MCH: 32.5 pg (ref 26.0–34.0)
MCHC: 32.8 g/dL (ref 30.0–36.0)
MCV: 98.8 fL (ref 78.0–100.0)
Platelets: 436 10*3/uL — ABNORMAL HIGH (ref 150–400)
RBC: 3.42 MIL/uL — AB (ref 4.22–5.81)
RDW: 16.1 % — ABNORMAL HIGH (ref 11.5–15.5)
WBC: 12.9 10*3/uL — AB (ref 4.0–10.5)

## 2014-02-08 LAB — URINALYSIS, ROUTINE W REFLEX MICROSCOPIC
GLUCOSE, UA: NEGATIVE mg/dL
Hgb urine dipstick: NEGATIVE
Ketones, ur: 15 mg/dL — AB
LEUKOCYTES UA: NEGATIVE
Nitrite: NEGATIVE
PROTEIN: NEGATIVE mg/dL
Specific Gravity, Urine: 1.026 (ref 1.005–1.030)
Urobilinogen, UA: 0.2 mg/dL (ref 0.0–1.0)
pH: 5.5 (ref 5.0–8.0)

## 2014-02-08 LAB — POCT INR: INR: 3

## 2014-02-08 LAB — I-STAT TROPONIN, ED: TROPONIN I, POC: 0 ng/mL (ref 0.00–0.08)

## 2014-02-08 LAB — PROTIME-INR
INR: 2.65 — ABNORMAL HIGH (ref 0.00–1.49)
PROTHROMBIN TIME: 28.5 s — AB (ref 11.6–15.2)

## 2014-02-08 LAB — TROPONIN I: Troponin I: <0.3 ng/mL (ref <0.30)

## 2014-02-08 MED ORDER — PANTOPRAZOLE SODIUM 40 MG PO TBEC
40.0000 mg | DELAYED_RELEASE_TABLET | Freq: Every day | ORAL | Status: DC
  Administered 2014-02-09: 40 mg via ORAL
  Filled 2014-02-08: qty 1

## 2014-02-08 MED ORDER — ONDANSETRON HCL 4 MG/2ML IJ SOLN
4.0000 mg | Freq: Once | INTRAMUSCULAR | Status: DC
  Filled 2014-02-08: qty 2

## 2014-02-08 MED ORDER — SODIUM CHLORIDE 0.9 % IJ SOLN
3.0000 mL | INTRAMUSCULAR | Status: DC | PRN
Start: 2014-02-08 — End: 2014-02-09

## 2014-02-08 MED ORDER — ATORVASTATIN CALCIUM 10 MG PO TABS
10.0000 mg | ORAL_TABLET | Freq: Every day | ORAL | Status: DC
  Filled 2014-02-08: qty 1

## 2014-02-08 MED ORDER — ONDANSETRON HCL 4 MG/2ML IJ SOLN: 4.0000 mg | Freq: Four times a day (QID) | INTRAMUSCULAR | Status: DC | PRN

## 2014-02-08 MED ORDER — ZOLPIDEM TARTRATE 5 MG PO TABS: 5.0000 mg | ORAL_TABLET | Freq: Every evening | ORAL | Status: DC | PRN

## 2014-02-08 MED ORDER — SERTRALINE HCL 50 MG PO TABS
50.0000 mg | ORAL_TABLET | Freq: Every day | ORAL | Status: DC
  Administered 2014-02-09: 50 mg via ORAL
  Filled 2014-02-08: qty 1

## 2014-02-08 MED ORDER — SODIUM CHLORIDE 0.9 % IV BOLUS (SEPSIS)
1000.0000 mL | Freq: Once | INTRAVENOUS | Status: AC
  Administered 2014-02-08: 1000 mL via INTRAVENOUS

## 2014-02-08 MED ORDER — WARFARIN SODIUM 4 MG PO TABS: 4.5000 mg | ORAL_TABLET | ORAL | Status: DC

## 2014-02-08 MED ORDER — ACETAMINOPHEN 325 MG PO TABS
650.0000 mg | ORAL_TABLET | ORAL | Status: DC | PRN
  Administered 2014-02-09: 650 mg via ORAL
  Filled 2014-02-08: qty 2

## 2014-02-08 MED ORDER — WARFARIN SODIUM 6 MG PO TABS
6.0000 mg | ORAL_TABLET | ORAL | Status: DC
  Filled 2014-02-08: qty 1

## 2014-02-08 MED ORDER — SODIUM CHLORIDE 0.9 % IJ SOLN
3.0000 mL | Freq: Two times a day (BID) | INTRAMUSCULAR | Status: DC
  Administered 2014-02-08: 3 mL via INTRAVENOUS

## 2014-02-08 MED ORDER — BUDESONIDE-FORMOTEROL FUMARATE 160-4.5 MCG/ACT IN AERO
2.0000 | INHALATION_SPRAY | Freq: Two times a day (BID) | RESPIRATORY_TRACT | Status: DC
  Filled 2014-02-08: qty 6

## 2014-02-08 MED ORDER — CLOPIDOGREL BISULFATE 75 MG PO TABS
75.0000 mg | ORAL_TABLET | Freq: Every day | ORAL | Status: DC
  Administered 2014-02-09: 75 mg via ORAL
  Filled 2014-02-08 (×2): qty 1

## 2014-02-08 MED ORDER — MORPHINE SULFATE 4 MG/ML IJ SOLN
4.0000 mg | Freq: Once | INTRAMUSCULAR | Status: AC
  Administered 2014-02-08: 4 mg via INTRAVENOUS
  Filled 2014-02-08: qty 1

## 2014-02-08 MED ORDER — WARFARIN - PHARMACIST DOSING INPATIENT: Freq: Every day | Status: DC

## 2014-02-08 MED ORDER — SOTALOL HCL 80 MG PO TABS
80.0000 mg | ORAL_TABLET | Freq: Two times a day (BID) | ORAL | Status: DC
  Administered 2014-02-08 – 2014-02-09 (×2): 80 mg via ORAL
  Filled 2014-02-08 (×3): qty 1

## 2014-02-08 MED ORDER — NITROGLYCERIN 0.4 MG SL SUBL: 0.4000 mg | SUBLINGUAL_TABLET | SUBLINGUAL | Status: DC | PRN

## 2014-02-08 MED ORDER — LORAZEPAM 0.5 MG PO TABS: 0.5000 mg | ORAL_TABLET | Freq: Every evening | ORAL | Status: DC | PRN

## 2014-02-08 MED ORDER — SODIUM CHLORIDE 0.9 % IV SOLN: 250.0000 mL | INTRAVENOUS | Status: DC | PRN

## 2014-02-08 MED ORDER — HYDROMORPHONE HCL 1 MG/ML IJ SOLN
1.0000 mg | Freq: Once | INTRAMUSCULAR | Status: AC
  Administered 2014-02-08: 1 mg via INTRAVENOUS
  Filled 2014-02-08: qty 1

## 2014-02-08 NOTE — Consult Note (Signed)
ANTICOAGULATION CONSULT NOTE - Initial Consult  Pharmacy Consult for Coumadin Indication: atrial fibrillation  Allergies  Allergen Reactions  . Penicillins Anaphylaxis     Because of a history of documented adverse serious drug reaction;Medi Alert bracelet  is recommended    Patient Measurements: Height: 6\' 2"  (188 cm) Weight: 267 lb (121.11 kg) IBW/kg (Calculated) : 82.2  Vital Signs: Temp: 98.5 F (36.9 C) (12/03 1706) Temp Source: Oral (12/03 1706) BP: 110/60 mmHg (12/03 1707) Pulse Rate: 58 (12/03 1707)  Labs:  Recent Labs  02/08/14 1406 02/08/14 1714 02/08/14 2004  HGB  --  11.1*  --   HCT  --  33.8*  --   PLT  --  436*  --   LABPROT  --  28.5*  --   INR 3 2.65*  --   CREATININE  --  0.91  --   TROPONINI  --   --  <0.30    Estimated Creatinine Clearance: 106 mL/min (by C-G formula based on Cr of 0.91).   Medical History: Past Medical History  Diagnosis Date  . BPH (benign prostatic hypertrophy)   . Spinal stenosis     Congential  . Hypertension   . Asthma   . DVT (deep venous thrombosis)     a. 2010 Lower ext s/p back surgery.  . Pulmonary embolism     a. 2010 in setting of DVT post-op back surgery. b. Low prob VQ 07/2013.  [name] PAF (paroxysmal atrial fibrillation)     a. Flecainide discontinued 07/2013 in setting of CAD.  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 COPD (chronic obstructive pulmonary disease)     a. 07/2013 PFT's mild airflow obstruction, no restriction, sev decrease in DLCO.  [name] GERD (gastroesophageal reflux disease)   . Diverticulosis   . Hx of echocardiogram 2015    Echo (06/2013): EF 60-65% normal wall motion, normal diastolic function, aortic sclerosis without stenosis, Trivial MR, mild SAM due to long, redundant mitral leaflets, mild RAE, normal RVSF  . Dyspnea on exertion     a. 07/2013 PFT's mild airflow obstr   . CAD (coronary artery disease)     a. 07/2013: s/p DES to LAD, normal LVF.  . Macrocytic anemia     a. 07/2013: documented on  prior labs.  . Obesity   . Transaminitis     a. 07/2013: mild.   Assessment: 68yom on coumadin pta for afib presented to the ED with dizziness. He will be admitted for observation and coumadin to be continued. INR is therapeutic at 2.65.   Home dose 4.5mg  daily except 6mg  Tues/Thurs/Sun - last dose taken today  Goal of Therapy:  INR 2-3 Monitor platelets by anticoagulation protocol: Yes   Plan:  1) Continue home regimen for now 2) Daily INR  Deboraha Sprang 02/08/2014,9:15 PM

## 2014-02-08 NOTE — ED Notes (Signed)
Pt attempting to given urine sample now.

## 2014-02-08 NOTE — ED Notes (Addendum)
Presents with dizziness and near syncope that occurred while at [facility] post fall while getting a cast on left wrist-he fell this AM after slipping on chalk, did not injure head. While getting cast became dizzy and felt like he was going to pass out. Sent over [facility], Sinus brady on monitor, HX of Afib and Stent placement in June in the [facility]. LAD. Pt denies chest pain reports SOB more than normal and worse with movement.  C/o pain in left wrist, given 178mcg of fentanyl by EMS.  He is conscious, alert and oriented per normal. No facial droop or arm drift.  Feels like "I am spinning, it is a little bit better if I shut my eyes"

## 2014-02-08 NOTE — H&P (Addendum)
Primary cardiologist: Dr. Minus Breeding  Reason for admission: Recurring dizziness, history of CAD and PAF  Clinical Summary [name] [name] is a 68 y.o.male with past medical history outlined below, referred to the ER from [facility] today. He states that he went out to get the paper this morning, slipped on a piece of sidewalk chalk and broke his fall by extending his left hand. He he had subsequent pain and swelling in his left wrist. As it turns out, he went with his wife to [facility] to have her right leg put in a cast due to a recent fracture around Thanksgiving, and asked that they check out his left wrist. By report, a fracture was found and he was placed in a cast as well. After having this done, and seated on the examination table, he suddenly began to feel dizzy and lightheaded, reportedly his heart rate was "erratic", although blood pressure was not low. He was placed in the supine position and symptoms resolved, when he tried to get back up, the symptoms recurred, a call was placed to the [facility] office, and he was subsequent referred to the ER.  Orthostatics done in the ER are negative, he is in sinus rhythm and has been throughout, ECG without acute ST segment changes. At no point has he had chest pain or shortness of breath. He reports compliance with his medications, outlined below.  His history includes known PAF, on flecainide until diagnosis of CAD back in May, and more recently on sotalol having presented with a prolonged episode of atrial fibrillation at the [facility] in September, requiring cardioversion. He has been on Coumadin throughout.  He tells me that he has had episodic feelings of dizziness, usually occurs when he stands up from a seated position, sometimes when he is walking to the bathroom, not associated with chest pain, sometimes palpitations, never frank syncope. This is been occurring for years, proceeding his stent placement  in May, and actually continuing since that time. He thinks that he felt somewhat better after having the stent placed, but it is not entirely clear in what way except perhaps a feeling of shortness of breath. He has somewhat of a hard time explaining it.   Allergies  Allergen Reactions  . Penicillins Anaphylaxis     Because of a history of documented adverse serious drug reaction;Medi Alert bracelet  is recommended    Home Medications No current facility-administered medications on file prior to encounter.   Current Outpatient Prescriptions on File Prior to Encounter  Medication Sig Dispense Refill  . atorvastatin (LIPITOR) 10 MG tablet Take 1 tablet (10 mg total) by mouth every evening. 90 tablet 3  . budesonide-formoterol (SYMBICORT) 160-4.5 MCG/ACT inhaler Inhale 2 puffs into the lungs 2 (two) times daily. 3 Inhaler 3  . clopidogrel (PLAVIX) 75 MG tablet Take 1 tablet (75 mg total) by mouth daily with breakfast. 90 tablet 3  . LORazepam (ATIVAN) 1 MG tablet TAKE 1/2-1 TABLET BY MOUTH DAILY AS NEEDED ONLY 30 tablet 0  . nitroGLYCERIN (NITROSTAT) 0.4 MG SL tablet Place 1 tablet (0.4 mg total) under the tongue every 5 (five) minutes as needed for chest pain (up to 3 doses). 25 tablet 1  . pantoprazole (PROTONIX) 40 MG tablet Take by mouth.    . sotalol (BETAPACE) 80 MG tablet Take 1 tablet (80 mg total) by mouth 2 (two) times daily. 180 tablet 3  . Umeclidinium-Vilanterol (ANORO ELLIPTA) 62.5-25 MCG/INH AEPB Inhale 1 Inhaler into the  lungs every morning. 1 each 11  . warfarin (COUMADIN) 3 MG tablet Take 3 mg by mouth as directed. Take 2 tablets (6 mg) by mouth Tuesdays, Thursdays, Sundays. Take 1 and a half tablets (4.5 mg) all other days.    [name] sertraline (ZOLOFT) 50 MG tablet TAKE 1 TABLET DAILY (Patient not taking: Reported on 02/08/2014) 90 tablet 1    Past Medical History  Diagnosis Date  . BPH (benign prostatic hypertrophy)   . Spinal stenosis     Congential  . Hypertension   .  Asthma   . DVT (deep venous thrombosis)     a. 2010 Lower ext s/p back surgery.  . Pulmonary embolism     a. 2010 in setting of DVT post-op back surgery. b. Low prob VQ 07/2013.  [name] PAF (paroxysmal atrial fibrillation)     a. Flecainide discontinued 07/2013 in setting of CAD.  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 COPD (chronic obstructive pulmonary disease)     a. 07/2013 PFT's mild airflow obstruction, no restriction, sev decrease in DLCO.  [name] GERD (gastroesophageal reflux disease)   . Diverticulosis   . Hx of echocardiogram 2015    Echo (06/2013): EF 60-65% normal wall motion, normal diastolic function, aortic sclerosis without stenosis, Trivial MR, mild SAM due to long, redundant mitral leaflets, mild RAE, normal RVSF  . Dyspnea on exertion     a. 07/2013 PFT's mild airflow obstruction, no restriction, sev decrease in DLCO.  [name] CAD (coronary artery disease)     a. 07/2013: s/p DES to LAD, normal LVF.  . Macrocytic anemia     a. 07/2013: documented on prior labs.  . Obesity   . Transaminitis     a. 07/2013: mild.    Past Surgical History  Procedure Laterality Date  . Vasectomy    . Lumbar fusion      x2  . Total hip arthroplasty Bilateral   . Cataracts    . Colonoscopy with polypectomy  2004    Family History  Problem Relation Age of Onset  . Atrial fibrillation Mother   . COPD Mother   . Heart disease Father   . Colon cancer Father   . Heart attack Brother 75  . Alcohol abuse Brother 58    Died post liver transplant    Social History [name] [name] reports that he quit smoking about 13 years ago. His smoking use included Cigarettes. He has a 80 pack-year smoking history. He has never used smokeless tobacco. [name] [name] reports that he drinks alcohol.  Review of Systems Complete review of systems negative except as otherwise outlined in the clinical summary and also the following. No fevers or chills. Recent "cold." Stable appetite. No orthopnea or PND. No reported bleeding  episodes on Coumadin.  Physical Examination Temp:  [98.5 F (36.9 C)] 98.5 F (36.9 C) (12/03 1706) Pulse Rate:  [57-58] 58 (12/03 1707) Resp:  [18-22] 18 (12/03 1707) BP: (110)/(60) 110/60 mmHg (12/03 1707) SpO2:  [98 %-100 %] 100 % (12/03 1707) No intake or output data in the 24 hours ending 02/08/14 1935  Telemetry: Normal sinus rhythm.  Overweight male, no distress. HEENT: Conjunctiva and lids normal, oropharynx clear. Neck: Supple, no elevated JVP or carotid bruits, no thyromegaly. Lungs: Clear to auscultation, nonlabored breathing at rest. Cardiac: Regular rate and rhythm, no S3 or significant systolic murmur, no pericardial rub. Abdomen: Soft, nontender, bowel sounds present, no guarding or rebound. Extremities: No pitting edema in the legs, cast on right forearm  and wrist, distal pulses 2+. Skin: Warm and dry. Musculoskeletal: No kyphosis. Neuropsychiatric: Alert and oriented x3, affect grossly appropriate.   Lab Results  Basic Metabolic Panel:  Recent Labs Lab 02/08/14 1714  NA 139  K 4.0  CL 102  CO2 22  GLUCOSE 88  BUN 15  CREATININE 0.91  CALCIUM 9.0    CBC:  Recent Labs Lab 02/08/14 1714  WBC 12.9*  HGB 11.1*  HCT 33.8*  MCV 98.8  PLT 436*    Cardiac Enzymes: POC troponin I 0.0  INR: 2.6  ECG Sinus bradycardia without acute ST segment changes.  Imaging EXAM: PORTABLE CHEST - 1 VIEW  COMPARISON: 01/10/2014  FINDINGS: There is mild elevation of the left diaphragm. There is mild left basilar scarring. There is no focal parenchymal opacity, pleural effusion, or pneumothorax. The heart and mediastinal contours are unremarkable.  The osseous structures are unremarkable.  IMPRESSION: No active disease.   Impression  1. Recurring episodes of dizziness/lightheadedness, seem to be somewhat orthostatic in description, although patient was not frankly orthostatic by ER assessment today. Also some question as to whether he may  be having PAF precipitating these symptoms, although this was not documented today. Symptoms do not sound ischemic in etiology, and have continued even since having his LAD stent placed in May. ECG shows no acute ST segment changes in sinus rhythm, and his point-of-care troponin I level is normal.  2. CAD status post DES to the LAD in May in the absence of ACS. He seems to indicate having some improvement in shortness of breath after this, but no change in the above episodes of dizziness.  3. PAF, currently on sotalol and Coumadin. Currently in sinus rhythm.  4. History of essential hypertension, blood pressure currently normal. He is not on antihypertensives at this time.   Recommendations  Reviewed records and discussed with patient and family in the room. Plan is to admit him for observation on telemetry, cycle cardiac markers. We will continue his present medical regimen, have him ambulate with assistance as well. Orthostatics will be repeated in the morning. If he remains stable with reassuring workup, anticipate discharge home with close outpatient follow-up. Might be worth considering an outpatient monitor, not entirely clear that follow-up ischemic testing is necessary at this time however.  Satira Sark, M.D., F.A.C.C.

## 2014-02-08 NOTE — ED Provider Notes (Signed)
TIME SEEN: 5:19 PM  CHIEF COMPLAINT: Near syncope  HPI: Pt is a 68 y.o. M with history of hypertension, prior pulmonary embolus and DVT after back surgery in 2010 who is on Coumadin, proximal atrial fibrillation on Coumadin, COPD, coronary artery disease with recent drug-eluting stent to the LAD in May 2015 who presents the emergency department with a near syncopal event today. Patient reports that this morning he was walking out to pick up his newspaper when he slipped on a piece of sidewalk chalk that was left in the driveway by his grandchildren. He states he fell to the ground catching himself with his left wrist since that he would not hit his head. Denies that he had any symptoms preceding this fall. He did not hit his head or pass out. He states that he went to [facility] today with his wife he needed a cast on her lower extremity. While he was there he reports he was evaluated for his left wrist pain. He was found to have a fracture and was placed in a cast. While in the orthopedic office and began to feel very lightheaded like he was going to pass out. Denies to me that he had any vertigo. States he has had shortness of breath with exertion but this is unchanged. Denies any chest pain or chest discomfort. He did have nausea with this episode and some mild diaphoresis. No recent vomiting or diarrhea.  Denies numbness, tingling or focal weakness.  States he is feeling better currently but if he were to get up to ambulate he would feel very lightheaded, nauseous and short of breath. States the symptoms are similar to when he had his cardiac catheterization.   PCP is Dr. Linna Darner Cardiologist is Dr. Percival Spanish  ROS: See HPI Constitutional: no fever  Eyes: no drainage  ENT: no runny nose   Cardiovascular:  no chest pain  Resp:  SOB  GI: no vomiting GU: no dysuria Integumentary: no rash  Allergy: no hives  Musculoskeletal: no leg swelling  Neurological: no slurred speech ROS  otherwise negative  PAST MEDICAL HISTORY/PAST SURGICAL HISTORY:  Past Medical History  Diagnosis Date  . BPH (benign prostatic hypertrophy)   . Spinal stenosis     congential  . Hypertension   . Asthma   . DVT (deep venous thrombosis)     a. 2010 Lower ext s/p back surgery.  . Pulmonary embolism     a. 2010 in setting of DVT post-op back surgery. b. Low prob VQ 07/2013.  [name] PAF (paroxysmal atrial fibrillation)     a. Flecainide discontinued 07/2013 in setting of CAD.  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 COPD (chronic obstructive pulmonary disease)     a. 07/2013 PFT's mild airflow obstruction, no restriction, sev decrease in DLCO.  [name] GERD (gastroesophageal reflux disease)   . Diverticulosis   . Hx of echocardiogram 2015    Echo (06/2013): EF 60-65% normal wall motion, normal diastolic function, aortic sclerosis without stenosis, Trivial MR, mild SAM due to long, redundant mitral leaflets, mild RAE, normal RVSF  . Dyspnea on exertion     a. 07/2013 PFT's mild airflow obstruction, no restriction, sev decrease in DLCO.  [name] CAD (coronary artery disease)     a. 07/2013: s/p DES to LAD, normal LVF.  . Macrocytic anemia     a. 07/2013: documented on prior labs.  . Obesity   . Transaminitis     a. 07/2013: mild.    MEDICATIONS:  Prior to Admission  medications   Medication Sig Start Date End Date Taking? Authorizing Provider  atorvastatin (LIPITOR) 10 MG tablet Take 1 tablet (10 mg total) by mouth every evening. 11/06/13   Minus Breeding, MD  budesonide-formoterol [facility]) 160-4.5 MCG/ACT inhaler Inhale 2 puffs into the lungs 2 (two) times daily. 10/24/13   Hendricks Limes, MD  clopidogrel (PLAVIX) 75 MG tablet Take 1 tablet (75 mg total) by mouth daily with breakfast. 08/08/13   Dayna N Dunn, PA-C  LORazepam (ATIVAN) 1 MG tablet TAKE 1/2-1 TABLET BY MOUTH DAILY AS NEEDED ONLY 01/15/14   Hendricks Limes, MD  nitroGLYCERIN (NITROSTAT) 0.4 MG SL tablet Place 1 tablet (0.4 mg total) under the  tongue every 5 (five) minutes as needed for chest pain (up to 3 doses). 08/08/13   Dayna N Dunn, PA-C  pantoprazole (PROTONIX) 40 MG tablet Take by mouth. 12/12/13   Historical Provider, MD  sertraline (ZOLOFT) 50 MG tablet TAKE 1 TABLET DAILY 11/29/13   Hendricks Limes, MD  sotalol (BETAPACE) 80 MG tablet Take 1 tablet (80 mg total) by mouth 2 (two) times daily. 01/05/14   Minus Breeding, MD  Umeclidinium-Vilanterol [facility] ELLIPTA) 62.5-25 MCG/INH AEPB Inhale 1 Inhaler into the lungs every morning. 01/22/14   Hendricks Limes, MD  warfarin (COUMADIN) 3 MG tablet Take 3 mg by mouth as directed. Take 2 tablets (6 mg) by mouth Tuesdays, Thursdays, Sundays. Take 1 and a half tablets (4.5 mg) all other days.    Historical Provider, MD    ALLERGIES:  Allergies  Allergen Reactions  . Penicillins Anaphylaxis     Because of a history of documented adverse serious drug reaction;Medi Alert bracelet  is recommended    SOCIAL HISTORY:  History  Substance Use Topics  . Smoking status: Former Smoker -- 2.00 packs/day for 40 years    Types: Cigarettes    Quit date: 03/09/2000  . Smokeless tobacco: Never Used     Comment: smoked 1964-2002, up to 3-4 ppd  . Alcohol Use: Yes     Comment:  rarely    FAMILY HISTORY: Family History  Problem Relation Age of Onset  . Atrial fibrillation Mother   . COPD Mother   . Heart disease Father   . Colon cancer Father   . Heart attack Brother 44  . Alcohol abuse Brother 37    died post liver transplant    EXAM: BP 110/60 mmHg  Pulse 58  Temp(Src) 98.5 F (36.9 C) (Oral)  Resp 18  SpO2 100% CONSTITUTIONAL: Alert and oriented and responds appropriately to questions. Well-appearing; well-nourished HEAD: Normocephalic EYES: Conjunctivae clear, PERRL ENT: normal nose; no rhinorrhea; moist mucous membranes; pharynx without lesions noted NECK: Supple, no meningismus, no LAD; no midline spinal tenderness or step-off or deformity CARD: RRR; S1 and S2  appreciated; no murmurs, no clicks, no rubs, no gallops RESP: Normal chest excursion without splinting or tachypnea; breath sounds clear and equal bilaterally; no wheezes, no rhonchi, no rales,  ABD/GI: Normal bowel sounds; non-distended; soft, non-tender, no rebound, no guarding BACK:  The back appears normal and is non-tender to palpation, there is no CVA tenderness; no midline spinal tenderness or step-off or deformity  EXT: Left upper extremity is in a short arm cast, normal range of motion in the left elbow and left shoulder, normal capillary refill, otherwise Normal ROM in all joints; otherwise extremities are non-tender to palpation; no edema; normal capillary refill; no cyanosis    SKIN: Normal color for age and race; warm  NEURO: Moves all extremities equally; sensation to light touch intact diffusely, cranial nerves II through XII intact PSYCH: The patient's mood and manner are appropriate. Grooming and personal hygiene are appropriate.  MEDICAL DECISION MAKING: Patient here with near syncopal event, shortness of breath with exertion, nausea and diaphoresis. States the symptoms are similar to her symptoms had requiring stent placement in May 2015. Has a history of atrial fibrillation but is currently in a sinus rhythm. He is on Coumadin. Will check cardiac labs, chest x-ray, urine. Will check orthostatic vital signs. We'll give IV fluids and pain medicine. Will consult cardiology.  ED PROGRESS: Patient is not orthostatic. Does have a mild leukocytosis of 12.9. Troponin negative. Chest x-ray clear. We'll discuss with cardiology.   6:50 PM  Spoke with Dr. Domenic Polite with cardiology. They will see pt in ED.   7:50 PM  Cards to admit.    EKG Interpretation  Date/Time:  Thursday February 08 2014 17:02:56 EST Ventricular Rate:  57 PR Interval:  159 QRS Duration: 93 QT Interval:  461 QTC Calculation: 449 R Axis:   43 Text Interpretation:  Sinus rhythm Confirmed by Dael Howland,  DO, Keymari Sato  [phone]) on 02/08/2014 5:18:47 PM         [place], DO 02/08/14 1958

## 2014-02-08 NOTE — ED Notes (Signed)
Cardiologist in to assess pt at this time.   

## 2014-02-09 ENCOUNTER — Ambulatory Visit: Payer: Medicare Other | Admitting: Pharmacist Clinician (PhC)/ Clinical Pharmacy Specialist

## 2014-02-09 DIAGNOSIS — S62102A Fracture of unspecified carpal bone, left wrist, initial encounter for closed fracture: Secondary | ICD-10-CM | POA: Diagnosis not present

## 2014-02-09 DIAGNOSIS — K219 Gastro-esophageal reflux disease without esophagitis: Secondary | ICD-10-CM | POA: Diagnosis not present

## 2014-02-09 DIAGNOSIS — R55 Syncope and collapse: Secondary | ICD-10-CM | POA: Diagnosis not present

## 2014-02-09 DIAGNOSIS — I2510 Atherosclerotic heart disease of native coronary artery without angina pectoris: Secondary | ICD-10-CM | POA: Diagnosis not present

## 2014-02-09 DIAGNOSIS — R42 Dizziness and giddiness: Secondary | ICD-10-CM | POA: Diagnosis not present

## 2014-02-09 LAB — BASIC METABOLIC PANEL
Anion gap: 12 (ref 5–15)
BUN: 14 mg/dL (ref 6–23)
CO2: 24 mEq/L (ref 19–32)
Calcium: 8.5 mg/dL (ref 8.4–10.5)
Chloride: 106 mEq/L (ref 96–112)
Creatinine, Ser: 0.91 mg/dL (ref 0.50–1.35)
GFR calc Af Amer: >90 mL/min (ref >90)
GFR, EST NON AFRICAN AMERICAN: 84 mL/min — AB (ref >90)
GLUCOSE: 92 mg/dL (ref 70–99)
Potassium: 4 mEq/L (ref 3.7–5.3)
Sodium: 142 mEq/L (ref 137–147)

## 2014-02-09 LAB — CBC
HEMATOCRIT: 33.1 % — AB (ref 39.0–52.0)
Hemoglobin: 10.7 g/dL — ABNORMAL LOW (ref 13.0–17.0)
MCH: 32.3 pg (ref 26.0–34.0)
MCHC: 32.3 g/dL (ref 30.0–36.0)
MCV: 100 fL (ref 78.0–100.0)
Platelets: 408 10*3/uL — ABNORMAL HIGH (ref 150–400)
RBC: 3.31 MIL/uL — ABNORMAL LOW (ref 4.22–5.81)
RDW: 16.1 % — ABNORMAL HIGH (ref 11.5–15.5)
WBC: 10.7 10*3/uL — ABNORMAL HIGH (ref 4.0–10.5)

## 2014-02-09 LAB — PROTIME-INR
INR: 2.55 — ABNORMAL HIGH (ref 0.00–1.49)
Prothrombin Time: 27.7 seconds — ABNORMAL HIGH (ref 11.6–15.2)

## 2014-02-09 LAB — TROPONIN I
Troponin I: <0.3 ng/mL (ref <0.30)
Troponin I: <0.3 ng/mL (ref <0.30)

## 2014-02-09 MED ORDER — HYDROCODONE-ACETAMINOPHEN 5-325 MG PO TABS
ORAL_TABLET | ORAL | Status: AC
  Filled 2014-02-09: qty 1

## 2014-02-09 MED ORDER — OXYCODONE-ACETAMINOPHEN 5-325 MG PO TABS
1.0000 | ORAL_TABLET | Freq: Once | ORAL | Status: AC
  Administered 2014-02-09: 2 via ORAL

## 2014-02-09 MED ORDER — OXYCODONE-ACETAMINOPHEN 5-325 MG PO TABS
ORAL_TABLET | ORAL | Status: AC
  Filled 2014-02-09: qty 2

## 2014-02-09 MED ORDER — HYDROCODONE-ACETAMINOPHEN 5-325 MG PO TABS
ORAL_TABLET | ORAL | Status: AC
  Filled 2014-02-09: qty 2

## 2014-02-09 MED ORDER — HYDROCODONE-ACETAMINOPHEN 5-325 MG PO TABS
1.0000 | ORAL_TABLET | Freq: Once | ORAL | Status: AC
  Administered 2014-02-09: 2 via ORAL

## 2014-02-09 NOTE — Progress Notes (Signed)
Discharge instructions given. Pt verbalized understanding and all questions were answered.  

## 2014-02-09 NOTE — Progress Notes (Signed)
    Subjective:  Denies CP or dyspnea   Objective:  Filed Vitals:   02/08/14 1707 02/08/14 2105 02/09/14 0531 02/09/14 0550  BP: 110/60 141/69 121/80   Pulse: 58 52 54   Temp:  98.1 F (36.7 C) 98.3 F (36.8 C)   TempSrc:  Oral Oral   Resp: 18 18 18    Height:  6\' 2"  (1.88 m)    Weight:  267 lb (121.11 kg)  266 lb 12.1 oz (121 kg)  SpO2: 100% 95% 96%     Intake/Output from previous day: No intake or output data in the 24 hours ending 02/09/14 0817  Physical Exam: Physical exam: Well-developed well-nourished in no acute distress.  Skin is warm and dry.  HEENT is normal.  Neck is supple.  Chest is clear to auscultation with normal expansion.  Cardiovascular exam is regular rate and rhythm.  Abdominal exam nontender or distended. No masses palpated. Extremities show no edema. Left upper extremity in cast from recent fracture neuro grossly intact    Lab Results: Basic Metabolic Panel:  Recent Labs  02/08/14 1714 02/09/14 0143  NA 139 142  K 4.0 4.0  CL 102 106  CO2 22 24  GLUCOSE 88 92  BUN 15 14  CREATININE 0.91 0.91  CALCIUM 9.0 8.5   CBC:  Recent Labs  02/08/14 1714 02/09/14 0143  WBC 12.9* 10.7*  HGB 11.1* 10.7*  HCT 33.8* 33.1*  MCV 98.8 100.0  PLT 436* 408*   Cardiac Enzymes:  Recent Labs  02/08/14 2004 02/09/14 0145  TROPONINI <0.30 <0.30     Assessment/Plan:  1 dizziness-etiology unclear. Telemetry reviewed and there is sinus bradycardia while asleep but otherwise unremarkable. Enzymes negative. Possible component of orthostasis. Would increase fluid intake. Patient can be discharged today and follow-up with Dr. Percival Spanish. 2 History of paroxysmal atrial fibrillation-continue sotalol and Coumadin. 3 history of coronary artery disease-status post drug-eluting stent to LAD May 2015-continue Plavix and statin. 4 status post fracture of wrist-follow-up orthopedics. > 30 min PA and physician time D2 Kirk Ruths 02/09/2014, 8:17  AM

## 2014-02-09 NOTE — Plan of Care (Signed)
Problem: Consults Goal: Skin Care Protocol Initiated - if Braden Score 18 or less If consults are not indicated, leave blank or document N/A Outcome: Not Applicable Date Met:  02/09/14     

## 2014-02-09 NOTE — Discharge Summary (Signed)
Discharge Summary   Patient ID: [name] [name],  MRN: [id number], DOB/AGE: 69-Feb-1947 69 y.o.  Admit date: 02/08/2014 Discharge date: 02/09/2014  Primary Care Provider: [name] Primary Cardiologist: Dr. Percival Spanish  Discharge Diagnoses Active Problems:   Dizziness   Allergies Allergies  Allergen Reactions  . Penicillins Anaphylaxis     Because of a history of documented adverse serious drug reaction;Medi Alert bracelet  is recommended    Hospital Course  The patient is a 68 year old male with past medical history of hypertension, postop DVT/PE in 2010, PAF, COPD, GERD, and history of coronary artery disease status post cath in May 2015 with DES placement to LAD. Apparently, patient had a fall when he slipped on piece of sidewalk chaulk and broke his fall by extending his left hand. He was accompanying his wife who had a recent fall and fracture around Thanksgiving to [facility] orthopedic to have her right leg placed in a cast. He asked them to evaluate his left wrist and he was found to have a fracture, therefore he was placed in a cast as well. After this was done, while he was sitting on the examination table, he suddenly began to feel dizzy and lightheaded. Per report of his heart rate was erratic at the time and blood pressure was not low. He was placed in supine position and the symptom resolved. He tried to get up, however symptom recurred. He was subsequently transferred to [facility] ED for further evaluation. Orthostatic blood pressure done in the ED was negative. He was in sinus rhythm without acute ST-T wave changes.  He was admitted to cardiology service, his description was somewhat orthostatic although negative orthostasis in the ED. He was admitted overnight for observation. Serial troponin was negative. He was seen the morning of 02/09/2014, the etiology of his dizziness was felt to be unclear. Telemetry shows sinus bradycardia, but otherwise unremarkable. Patient was  instructed to increase his fluid intake. He will continue on sotalol and Coumadin. He is deemed stable for discharge from cardiology perspective. He has a scheduled follow-up with Dr. Percival Spanish in late January. Patient has been instructed to call cardiology to schedule a closer followup if has recurrent symptom or sign of bleeding  Of note, it appears patient is currently on Coumadin and Plavix, no aspirin given increased risk of bleeding.  Discharge Vitals Blood pressure 121/80, pulse 54, temperature 98.3 F (36.8 C), temperature source Oral, resp. rate 18, height 6\' 2"  (1.88 m), weight 266 lb 12.1 oz (121 kg), SpO2 96 %.  Filed Weights   02/08/14 2105 02/09/14 0550  Weight: 267 lb (121.11 kg) 266 lb 12.1 oz (121 kg)    Labs  CBC  Recent Labs  02/08/14 1714 02/09/14 0143  WBC 12.9* 10.7*  HGB 11.1* 10.7*  HCT 33.8* 33.1*  MCV 98.8 100.0  PLT 436* 387*   Basic Metabolic Panel  Recent Labs  02/08/14 1714 02/09/14 0143  NA 139 142  K 4.0 4.0  CL 102 106  CO2 22 24  GLUCOSE 88 92  BUN 15 14  CREATININE 0.91 0.91  CALCIUM 9.0 8.5   Cardiac Enzymes  Recent Labs  02/08/14 2004 02/09/14 0145 02/09/14 0805  TROPONINI <0.30 <0.30 <0.30    Disposition  Pt is being discharged home today in good condition.  Follow-up Plans & Appointments      Follow-up Information    Follow up with Minus Breeding, MD On 04/03/2014.   Specialty:  Cardiology   Why:  2:15pm  Contact information:   [address] [place] [address] [phone]       Discharge Medications    Medication List    TAKE these medications        atorvastatin 10 MG tablet  Commonly known as:  LIPITOR  Take 1 tablet (10 mg total) by mouth every evening.     budesonide-formoterol 160-4.5 MCG/ACT inhaler  Commonly known as:  SYMBICORT  Inhale 2 puffs into the lungs 2 (two) times daily.     clopidogrel 75 MG tablet  Commonly known as:  PLAVIX  Take 1 tablet (75 mg total) by  mouth daily with breakfast.     LORazepam 1 MG tablet  Commonly known as:  ATIVAN  TAKE 1/2-1 TABLET BY MOUTH DAILY AS NEEDED ONLY     nitroGLYCERIN 0.4 MG SL tablet  Commonly known as:  NITROSTAT  Place 1 tablet (0.4 mg total) under the tongue every 5 (five) minutes as needed for chest pain (up to 3 doses).     PROTONIX 40 MG tablet  Generic drug:  pantoprazole  Take by mouth.     sertraline 50 MG tablet  Commonly known as:  ZOLOFT  Take 50 mg by mouth daily.     sertraline 50 MG tablet  Commonly known as:  ZOLOFT  TAKE 1 TABLET DAILY     sotalol 80 MG tablet  Commonly known as:  BETAPACE  Take 1 tablet (80 mg total) by mouth 2 (two) times daily.     Umeclidinium-Vilanterol 62.5-25 MCG/INH Aepb  Commonly known as:  ANORO ELLIPTA  Inhale 1 Inhaler into the lungs every morning.     warfarin 3 MG tablet  Commonly known as:  COUMADIN  Take 3 mg by mouth as directed. Take 2 tablets (6 mg) by mouth Tuesdays, Thursdays, Sundays. Take 1 and a half tablets (4.5 mg) all other days.         Duration of Discharge Encounter   Greater than 30 minutes including physician time.  Hilbert Corrigan PA-C Pager: [phone] 02/09/2014, 11:05 AM

## 2014-02-12 DIAGNOSIS — S52552D Other extraarticular fracture of lower end of left radius, subsequent encounter for closed fracture with routine healing: Secondary | ICD-10-CM | POA: Diagnosis not present

## 2014-02-12 DIAGNOSIS — S6992XD Unspecified injury of left wrist, hand and finger(s), subsequent encounter: Secondary | ICD-10-CM | POA: Diagnosis not present

## 2014-02-14 NOTE — Telephone Encounter (Signed)
Dr C spoke to Dr Delilah Shan.

## 2014-02-15 ENCOUNTER — Encounter (HOSPITAL_COMMUNITY): Payer: Self-pay | Admitting: Cardiology

## 2014-02-16 ENCOUNTER — Ambulatory Visit: Payer: Medicare Other | Admitting: Internal Medicine

## 2014-02-20 ENCOUNTER — Encounter: Payer: Self-pay | Admitting: Cardiology

## 2014-02-20 ENCOUNTER — Ambulatory Visit (INDEPENDENT_AMBULATORY_CARE_PROVIDER_SITE_OTHER): Payer: Medicare Other | Admitting: Cardiology

## 2014-02-20 VITALS — BP 108/71 | HR 64 | Ht 74.0 in | Wt 261.5 lb

## 2014-02-20 DIAGNOSIS — I480 Paroxysmal atrial fibrillation: Secondary | ICD-10-CM

## 2014-02-20 DIAGNOSIS — I10 Essential (primary) hypertension: Secondary | ICD-10-CM | POA: Diagnosis not present

## 2014-02-20 DIAGNOSIS — I2510 Atherosclerotic heart disease of native coronary artery without angina pectoris: Secondary | ICD-10-CM | POA: Diagnosis not present

## 2014-02-20 NOTE — Progress Notes (Signed)
HPI  The patient presents for follow up of atrial fibrillation, HTN, prior DVT and pulmonary emboli and CAD.   He was in [place] at the end of Sept and went into atrial fib.  He was hospitalized at the [facility].  He did have a nuclear stress test which was negative.  He was started on Sotalol but his QTc increased on 120 mg BID.  He was discharged on 80 mg BID.  He did require cardioversion.  Of note an echocardiogram did demonstrate MR.  He did have some bradycardia and orthostasis during that hospitalization.  Of note he was also screened for septal hypertrophy as there was some of this with SAM noted on the echo.  However, MRI did not confirm this.      He was hospitalized briefly since last saw him with palpitations it happened when he was getting his arm cast at in orthopedic office.   He was in sinus rhythm before he came to the ED.   He did have a mild orthostatic blood pressure drop and was complaining of dizziness at that time. He has since had no symptoms similar to that  He he was in fibrillation at that time. He has had some mild dizziness when he goes from a seated to a standing position.    Allergies  Allergen Reactions  . Penicillins Anaphylaxis     Because of a history of documented adverse serious drug reaction;Medi Alert bracelet  is recommended    Current Outpatient Prescriptions  Medication Sig Dispense Refill  . atorvastatin (LIPITOR) 10 MG tablet Take 1 tablet (10 mg total) by mouth every evening. 90 tablet 3  . budesonide-formoterol (SYMBICORT) 160-4.5 MCG/ACT inhaler Inhale 2 puffs into the lungs 2 (two) times daily. 3 Inhaler 3  . clopidogrel (PLAVIX) 75 MG tablet Take 1 tablet (75 mg total) by mouth daily with breakfast. 90 tablet 3  . LORazepam (ATIVAN) 1 MG tablet TAKE 1/2-1 TABLET BY MOUTH DAILY AS NEEDED ONLY 30 tablet 0  . nitroGLYCERIN (NITROSTAT) 0.4 MG SL tablet Place 1 tablet (0.4 mg total) under the tongue every 5 (five) minutes as needed for  chest pain (up to 3 doses). 25 tablet 1  . pantoprazole (PROTONIX) 40 MG tablet Take by mouth.    . sertraline (ZOLOFT) 50 MG tablet TAKE 1 TABLET DAILY 90 tablet 1  . sotalol (BETAPACE) 80 MG tablet Take 1 tablet (80 mg total) by mouth 2 (two) times daily. 180 tablet 3  . Umeclidinium-Vilanterol (ANORO ELLIPTA) 62.5-25 MCG/INH AEPB Inhale 1 Inhaler into the lungs every morning. 1 each 11  . warfarin (COUMADIN) 3 MG tablet Take 3 mg by mouth as directed. Take 2 tablets (6 mg) by mouth Tuesdays, Thursdays, Sundays. Take 1 and a half tablets (4.5 mg) all other days.     No current facility-administered medications for this visit.    Past Medical History  Diagnosis Date  . BPH (benign prostatic hypertrophy)   . Spinal stenosis     Congential  . Hypertension   . Asthma   . DVT (deep venous thrombosis)     a. 2010 Lower ext s/p back surgery.  . Pulmonary embolism     a. 2010 in setting of DVT post-op back surgery. b. Low prob VQ 07/2013.  [name] PAF (paroxysmal atrial fibrillation)     a. Flecainide discontinued 07/2013 in setting of CAD.  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 COPD (chronic obstructive pulmonary disease)     a.  07/2013 PFT's mild airflow obstruction, no restriction, sev decrease in DLCO.  [name] GERD (gastroesophageal reflux disease)   . Diverticulosis   . Hx of echocardiogram 2015    Echo (06/2013): EF 60-65% normal wall motion, normal diastolic function, aortic sclerosis without stenosis, Trivial MR, mild SAM due to long, redundant mitral leaflets, mild RAE, normal RVSF  . Dyspnea on exertion     a. 07/2013 PFT's mild airflow obstr   . CAD (coronary artery disease)     a. 07/2013: s/p DES to LAD, normal LVF.  . Macrocytic anemia     a. 07/2013: documented on prior labs.  . Obesity   . Transaminitis     a. 07/2013: mild.  . Complication of anesthesia     "I got all kinds of hallucinations"  . Pneumonia ~ 2010 X 1  . History of hiatal hernia   . Arthritis   . History of gout      Past Surgical History  Procedure Laterality Date  . Vasectomy    . Lumbar fusion  02/2008  . Total hip arthroplasty Bilateral   . Cataract extraction w/ intraocular lens  implant, bilateral Bilateral   . Colonoscopy w/ polypectomy  2004  . Joint replacement    . Anterior lumbar disc arthroplasty  03/2008    "spacer poped out; had to repair"  . Coronary angioplasty with stent placement  08/08/2013    "1"  . Left and right heart catheterization with coronary angiogram N/A 08/07/2013    Procedure: LEFT AND RIGHT HEART CATHETERIZATION WITH CORONARY ANGIOGRAM;  Surgeon: Peter M Martinique, MD;  Location: [facility] CATH LAB;  Service: Cardiovascular;  Laterality: N/A;  . Percutaneous coronary stent intervention (pci-s)  08/07/2013    Procedure: PERCUTANEOUS CORONARY STENT INTERVENTION (PCI-S);  Surgeon: Peter M Martinique, MD;  Location: [facility] CATH LAB;  Service: Cardiovascular;;    ROS:  As stated in the HPI and negative for all other systems.  PHYSICAL EXAM BP 108/71 mmHg  Pulse 64  Ht 6\' 2"  (1.88 m)  Wt 261 lb 8 oz (118.616 kg)  BMI 33.56 kg/m2 GENERAL:  Well appearing HEENT:  Pupils equal round and reactive, fundi not visualized, oral mucosa unremarkable NECK:  No jugular venous distention, waveform within normal limits, carotid upstroke brisk and symmetric, no bruits, no thyromegaly LYMPHATICS:  No cervical, inguinal adenopathy LUNGS:  Clear to auscultation bilaterally BACK:  No CVA tenderness CHEST:  Unremarkable HEART:  PMI not displaced or sustained,S1 and S2 within normal limits, no S3, no S4, no clicks, no rubs, no murmurs ABD:  Flat, positive bowel sounds normal in frequency in pitch, no bruits, no rebound, no guarding, no midline pulsatile mass, no hepatomegaly, no splenomegaly, obese EXT:  2 plus pulses throughout, no edema, no cyanosis no clubbing SKIN:  No rashes no nodules NEURO:  Cranial nerves II through XII grossly intact, motor grossly intact throughout PSYCH:  Cognitively intact,  oriented to person place and time   ASSESSMENT AND PLAN  ATRIAL FIBRILLATION:  He might have had an episode of fibrillation.  However, he is in sinus now.  He will continue with the meds as listed.  However, if he has more spells he will let me know and we might need to change his medications.    CAD:  The patient has no new sypmtoms.  No further cardiovascular testing is indicated.  We will continue with aggressive risk reduction and meds as listed.  I did review the stress perfusion results from [facility] and  there was no evidence of ischemia.  HTN:  The blood pressure is somewhat low now and I suspect that his dizziness is related to orthostasis.  We talked about how to avoid symptoms when going from a seated to a standing position.    MR:  He had some moderate MR and mild septal hypertrophy.  I will follow this clinically and with repeat echocardiography.  No further work up is indicated at this point.

## 2014-02-20 NOTE — Patient Instructions (Signed)
Your physician recommends that you schedule a follow-up appointment in: 6 months with Dr. Hochrein  

## 2014-02-23 ENCOUNTER — Encounter: Payer: Self-pay | Admitting: Internal Medicine

## 2014-02-23 ENCOUNTER — Ambulatory Visit (INDEPENDENT_AMBULATORY_CARE_PROVIDER_SITE_OTHER): Payer: Medicare Other | Admitting: Internal Medicine

## 2014-02-23 DIAGNOSIS — I2510 Atherosclerotic heart disease of native coronary artery without angina pectoris: Secondary | ICD-10-CM

## 2014-02-23 DIAGNOSIS — R42 Dizziness and giddiness: Secondary | ICD-10-CM | POA: Diagnosis not present

## 2014-02-23 NOTE — Progress Notes (Signed)
Pre visit review using our clinic review tool, if applicable. No additional management support is needed unless otherwise documented below in the visit note. 

## 2014-02-23 NOTE — Patient Instructions (Signed)
Perform isometric exercise of calves  ( while seated go up on toes to count of 5 & then onto heels for 5 count). Repeat  4- 5 times prior to standing if you've been seated or supine for any significant period of time as BP drops with such positions.  

## 2014-02-23 NOTE — Progress Notes (Signed)
   Subjective:    Patient ID: [name] [name], male    DOB: Apr 07, 1945, 68 y.o.   MRN: [id number]  HPI  He was hospitalized 12/3-12/4/15. Symptoms began when he slipped on a piece of chalk his grandchildren had left on the concrete. He fell landing on the left wrist sustaining a fracture  While in the orthopedic office for casting  he became dizzy and lightheaded. Apparently the orthopedic staff noted irregular heart rhythm. He was placed supine with improvement; but as soon as he sat up he had the same symptoms  He was taken to the emergency room and admitted for observation  Troponin was negative. GFR was mildly reduced at 84. He was mildly anemic with hemoglobin 10.7 hematocrit 33.1.  This is in the context of being on both warfarin and Plavix. Apparently this combination was prescribed at [facility].Also while there he was placed on sotalol 120 mg twice a day but this increased the QT interval. He was decreased to 80 mg twice a day.  Once his heart rate was at goal he was cardioverted.   He's had no symptoms since discharge from [facility]. He denies any cardiac or neurologic prodrome prior to the fall or the symptoms in the orthopedic office.  Review of Systems   Denied were any change in heart rhythm or rate prior to the event. There was no associated chest pain or shortness of breath .  Also specifically denied prior to the episode were headache, limb weakness, tingling, or numbness. No seizure activity noted.      Objective:   Physical Exam  Positive or pertinent findings include: Heart sounds are distant; the rhythm appears regular but markedly slow. He has minor rhonchi in the lower lobes without increased work of breathing Abdomen is protuberant The left forearm is in a hard cast Pedal pulses are decreased. He has trace edema at the sock line.  General appearance :adequately nourished; in no distress. Eyes: No conjunctival inflammation or scleral icterus is  present. Heart:   S1 and S2 normal without gallop, murmur, click, rub or other extra sounds   Abdomen: bowel sounds normal, soft and non-tender without masses, organomegaly or hernias noted.  No guarding or rebound.  Vascular : all pulses equal ; no bruits present. Skin:Warm & dry.  Intact without suspicious lesions or rashes ; no jaundice or tenting Lymphatic: No lymphadenopathy is noted about the head, neck, axilla          Assessment & Plan:  #! Possible vasovagal versus postural hypotension symptoms. No evidence of acute cardiac injury  Plan: He was instructed in isometrics prior to standing from bed at night or if having been seated for a period time.

## 2014-03-05 ENCOUNTER — Ambulatory Visit (INDEPENDENT_AMBULATORY_CARE_PROVIDER_SITE_OTHER): Payer: Medicare Other | Admitting: Pharmacist Clinician (PhC)/ Clinical Pharmacy Specialist

## 2014-03-05 DIAGNOSIS — I4891 Unspecified atrial fibrillation: Secondary | ICD-10-CM

## 2014-03-05 DIAGNOSIS — S6992XD Unspecified injury of left wrist, hand and finger(s), subsequent encounter: Secondary | ICD-10-CM | POA: Diagnosis not present

## 2014-03-05 DIAGNOSIS — I480 Paroxysmal atrial fibrillation: Secondary | ICD-10-CM | POA: Diagnosis not present

## 2014-03-05 DIAGNOSIS — Z86718 Personal history of other venous thrombosis and embolism: Secondary | ICD-10-CM

## 2014-03-05 LAB — POCT INR: INR: 3.8

## 2014-03-07 ENCOUNTER — Ambulatory Visit: Payer: Medicare Other | Admitting: Pharmacist Clinician (PhC)/ Clinical Pharmacy Specialist

## 2014-03-08 ENCOUNTER — Ambulatory Visit (INDEPENDENT_AMBULATORY_CARE_PROVIDER_SITE_OTHER): Payer: Medicare Other | Admitting: Internal Medicine

## 2014-03-08 ENCOUNTER — Encounter: Payer: Self-pay | Admitting: Internal Medicine

## 2014-03-08 VITALS — BP 108/70 | HR 73 | Temp 98.4°F | Ht 74.0 in | Wt 264.0 lb

## 2014-03-08 DIAGNOSIS — I2510 Atherosclerotic heart disease of native coronary artery without angina pectoris: Secondary | ICD-10-CM

## 2014-03-08 DIAGNOSIS — J441 Chronic obstructive pulmonary disease with (acute) exacerbation: Secondary | ICD-10-CM

## 2014-03-08 MED ORDER — DOXYCYCLINE HYCLATE 100 MG PO TABS: 100.0000 mg | ORAL_TABLET | Freq: Two times a day (BID) | ORAL | Status: DC

## 2014-03-08 MED ORDER — PREDNISONE 20 MG PO TABS: 20.0000 mg | ORAL_TABLET | Freq: Two times a day (BID) | ORAL | Status: DC

## 2014-03-08 NOTE — Patient Instructions (Addendum)
Plain Mucinex (NOT D) for thick secretions ;force NON dairy fluids .   Nasal cleansing in the shower as discussed with lather of mild shampoo.After 10 seconds wash off lather while  exhaling through nostrils. Make sure that all residual soap is removed to prevent irritation.  Flonase OR Nasacort AQ 1 spray in each nostril twice a day as needed. Use the "crossover" technique into opposite nostril spraying toward opposite ear @ 45 degree angle, not straight up into nostril. Plain Allegra (NOT D )  160 daily , Loratidine 10 mg , OR Zyrtec 10 mg @ bedtime  as needed for itchy eyes & sneezing. Fill the  prescription for Doxycycline it there is not dramatic improvement in the next 48 hours.Check PT/INR after 2 days if this is filled

## 2014-03-08 NOTE — Progress Notes (Signed)
Pre visit review using our clinic review tool, if applicable. No additional management support is needed unless otherwise documented below in the visit note. 

## 2014-03-08 NOTE — Progress Notes (Signed)
   Subjective:    Patient ID: [name] [name], male    DOB: [birth year], 68 y.o.   MRN: [id number]  HPI  Symptoms began last night apparently 6 PM as paroxysmal nonproductive cough  He's had some itchy, watery eyes and sneezing. He also has had wheezing with the cough  He had been exposed to a son-in-law who had bronchitis and granddaughter who has "sniffles".  He has been using Symbicort 2 puffs twice a day.  He has no other upper respiratory tract or extrinsic symptoms.  PT/INR was high earlier this week; warfarin was adjusted.  Review of Systems Frontal headache, facial pain , nasal purulence, dental pain, sore throat , otic pain or otic discharge denied. No fever , chills or sweats.     Objective:   Physical Exam  Exam is unremarkable except for diffuse low-grade rhonchi and wheezing without increased work of breathing. Heart sounds are somewhat distant.  General appearance:good health ;well nourished; no acute distress or increased work of breathing is present.  No  lymphadenopathy about the head, neck, or axilla noted.  Eyes: No conjunctival inflammation or lid edema is present. There is no scleral icterus. Ears:  External ear exam shows no significant lesions or deformities.  Otoscopic examination reveals clear canals, tympanic membranes are intact bilaterally without bulging, retraction, inflammation or discharge. Nose:  External nasal examination shows no deformity or inflammation. Nasal mucosa are pink and moist without lesions or exudates. No septal dislocation or deviation.No obstruction to airflow.  Oral exam: Dental hygiene is good; lips and gums are healthy appearing.There is no oropharyngeal erythema or exudate noted.  Neck:  No deformities, thyromegaly, masses, or tenderness noted.   Heart:  Normal rate and regular rhythm. S1 and S2 normal without gallop, murmur, click, rub or other extra sounds.  Extremities:  No cyanosis, edema, or clubbing  noted  Skin: Warm &  dry w/o jaundice or tenting.       Assessment & Plan:  #1 COPD with asthmatic component exacerbation  Plan: See orders and recommendations

## 2014-03-12 ENCOUNTER — Other Ambulatory Visit: Payer: Self-pay

## 2014-03-12 MED ORDER — SERTRALINE HCL 50 MG PO TABS: 50.0000 mg | ORAL_TABLET | Freq: Every day | ORAL | Status: DC

## 2014-03-12 MED ORDER — SOTALOL HCL 80 MG PO TABS: 80.0000 mg | ORAL_TABLET | Freq: Two times a day (BID) | ORAL | Status: DC

## 2014-03-12 NOTE — Telephone Encounter (Signed)
Okay for refill on Zoloft ?

## 2014-03-12 NOTE — Telephone Encounter (Signed)
OK X 3 mos 

## 2014-03-13 ENCOUNTER — Telehealth: Payer: Self-pay

## 2014-03-13 ENCOUNTER — Other Ambulatory Visit: Payer: Self-pay | Admitting: Cardiology

## 2014-03-13 ENCOUNTER — Other Ambulatory Visit: Payer: Self-pay | Admitting: *Deleted

## 2014-03-13 ENCOUNTER — Other Ambulatory Visit: Payer: Self-pay

## 2014-03-13 ENCOUNTER — Telehealth: Payer: Self-pay | Admitting: Internal Medicine

## 2014-03-13 MED ORDER — ATORVASTATIN CALCIUM 10 MG PO TABS: 10.0000 mg | ORAL_TABLET | Freq: Every evening | ORAL | Status: DC

## 2014-03-13 MED ORDER — PANTOPRAZOLE SODIUM 40 MG PO TBEC: 40.0000 mg | DELAYED_RELEASE_TABLET | Freq: Every day | ORAL | Status: DC

## 2014-03-13 MED ORDER — HYDROCODONE-HOMATROPINE 5-1.5 MG/5ML PO SYRP: 5.0000 mL | ORAL_SOLUTION | Freq: Three times a day (TID) | ORAL | Status: DC | PRN

## 2014-03-13 MED ORDER — WARFARIN SODIUM 3 MG PO TABS: ORAL_TABLET | ORAL | Status: DC

## 2014-03-13 MED ORDER — CLOPIDOGREL BISULFATE 75 MG PO TABS: 75.0000 mg | ORAL_TABLET | Freq: Every day | ORAL | Status: DC

## 2014-03-13 NOTE — Telephone Encounter (Signed)
Done Warfarin rx sent to [facility] as requested.

## 2014-03-13 NOTE — Telephone Encounter (Signed)
Refill done as requested 

## 2014-03-13 NOTE — Telephone Encounter (Signed)
Patient has appointment for tomorrow.  Is requesting refill on cough med  - hydrocodone homatropine to be sent to [facility].

## 2014-03-13 NOTE — Telephone Encounter (Signed)
OK  He does not need to come if that's all he needed

## 2014-03-13 NOTE — Telephone Encounter (Signed)
Patient wife notified prescription is called in and she states he still needs the appointment for tomorrow

## 2014-03-14 ENCOUNTER — Ambulatory Visit (INDEPENDENT_AMBULATORY_CARE_PROVIDER_SITE_OTHER): Payer: Medicare Other | Admitting: Internal Medicine

## 2014-03-14 ENCOUNTER — Encounter: Payer: Self-pay | Admitting: Internal Medicine

## 2014-03-14 VITALS — BP 118/84 | HR 68 | Temp 98.2°F | Wt 264.0 lb

## 2014-03-14 DIAGNOSIS — R05 Cough: Secondary | ICD-10-CM | POA: Diagnosis not present

## 2014-03-14 DIAGNOSIS — K219 Gastro-esophageal reflux disease without esophagitis: Secondary | ICD-10-CM

## 2014-03-14 DIAGNOSIS — R059 Cough, unspecified: Secondary | ICD-10-CM

## 2014-03-14 NOTE — Patient Instructions (Signed)
Reflux of gastric acid may be asymptomatic as this may occur mainly during sleep.The triggers for reflux  include stress; the "aspirin family" ; alcohol; peppermint; and caffeine (coffee, tea, cola, and chocolate). The aspirin family would include aspirin and the nonsteroidal agents such as ibuprofen &  Naproxen. Tylenol would not cause reflux. If having symptoms ; food & drink should be avoided for @ least 2 hours before going to bed. Take the protein pump inhibitor Pantoprazole  30 minutes before breakfast and 30 minutes before the evening meal until cough gone 72 hours; then go back to once a day  30 minutes before breakfast.

## 2014-03-14 NOTE — Progress Notes (Signed)
Pre visit review using our clinic review tool, if applicable. No additional management support is needed unless otherwise documented below in the visit note. 

## 2014-03-15 NOTE — Progress Notes (Signed)
   Subjective:    Patient ID: [name] [name], male    DOB: [birth year], 69 y.o.   MRN: [id number]  HPI All RTI symptoms have resolved with the antibiotics ; but he is being awakened @ approximately 3 am with a NP cough.   He denies URI , extrinsic or GERD symptoms.    Review of Systems Frontal headache, facial pain , nasal purulence, dental pain, sore throat , otic pain or otic discharge denied. No fever , chills or sweats. Extrinsic symptoms of itchy, watery eyes, sneezing, or angioedema are denied. There is no sputum production, wheezing,or  paroxysmal nocturnal dyspnea. Unexplained weight loss, abdominal pain, significant dyspepsia, dysphagia, melena, or rectal bleeding are denied.     Objective:   Physical Exam  General appearance:good health ;well nourished; no acute distress or increased work of breathing is present.  No  lymphadenopathy about the head, neck, or axilla noted.   Eyes: No conjunctival inflammation or lid edema is present. There is no scleral icterus.  Ears:  External ear exam shows no significant lesions or deformities.  Otoscopic examination reveals clear canals, tympanic membranes are intact bilaterally without bulging, retraction, inflammation or discharge.  Nose:  External nasal examination shows no deformity or inflammation. Nasal mucosa are pink and moist without lesions or exudates. No septal dislocation or deviation.No obstruction to airflow.   Oral exam: Dental hygiene is good; lips and gums are healthy appearing.There is no oropharyngeal erythema or exudate noted.   Neck:  No deformities, thyromegaly, masses, or tenderness noted.   Supple with full range of motion without pain.   Heart:  Normal rate and regular rhythm. S1 and S2 normal without gallop, murmur, click, rub or other extra sounds.   Lungs:Chest essentially clear to auscultation. Rare isolated wheeze w/o rhonchi,rales ,or rubs present.No increased work of breathing.    Extremities:  No  cyanosis, edema, or clubbing  noted    Skin: Warm & dry w/o jaundice or tenting.       Assessment & Plan:  #1 cough , probably from silent GERD #2 RTI resolved See orders & AVS

## 2014-03-23 ENCOUNTER — Telehealth: Payer: Self-pay | Admitting: Cardiology

## 2014-03-23 NOTE — Telephone Encounter (Signed)
Close encounter 

## 2014-03-26 ENCOUNTER — Ambulatory Visit (INDEPENDENT_AMBULATORY_CARE_PROVIDER_SITE_OTHER): Payer: Medicare Other | Admitting: Pharmacist Clinician (PhC)/ Clinical Pharmacy Specialist

## 2014-03-26 DIAGNOSIS — S6992XD Unspecified injury of left wrist, hand and finger(s), subsequent encounter: Secondary | ICD-10-CM | POA: Diagnosis not present

## 2014-03-26 DIAGNOSIS — I480 Paroxysmal atrial fibrillation: Secondary | ICD-10-CM | POA: Diagnosis not present

## 2014-03-26 DIAGNOSIS — I4891 Unspecified atrial fibrillation: Secondary | ICD-10-CM

## 2014-03-26 DIAGNOSIS — S52552D Other extraarticular fracture of lower end of left radius, subsequent encounter for closed fracture with routine healing: Secondary | ICD-10-CM | POA: Diagnosis not present

## 2014-03-26 DIAGNOSIS — Z86718 Personal history of other venous thrombosis and embolism: Secondary | ICD-10-CM

## 2014-03-26 LAB — POCT INR: INR: 2.8

## 2014-04-03 ENCOUNTER — Ambulatory Visit: Payer: Medicare Other | Admitting: Cardiology

## 2014-04-03 DIAGNOSIS — S6992XD Unspecified injury of left wrist, hand and finger(s), subsequent encounter: Secondary | ICD-10-CM | POA: Diagnosis not present

## 2014-04-03 DIAGNOSIS — M25532 Pain in left wrist: Secondary | ICD-10-CM | POA: Diagnosis not present

## 2014-04-05 ENCOUNTER — Telehealth: Payer: Self-pay | Admitting: Internal Medicine

## 2014-04-05 NOTE — Telephone Encounter (Signed)
Patient wife has been notified

## 2014-04-05 NOTE — Telephone Encounter (Signed)
Pt wife called in and would like to know if Dr Linna Darner could recommend a hand surgeon?

## 2014-04-05 NOTE — Telephone Encounter (Signed)
Dr Amedeo Plenty, [facility]

## 2014-04-09 ENCOUNTER — Other Ambulatory Visit: Payer: Self-pay | Admitting: Internal Medicine

## 2014-04-09 ENCOUNTER — Ambulatory Visit: Payer: Medicare Other

## 2014-04-09 NOTE — Telephone Encounter (Signed)
OK X1 

## 2014-04-09 NOTE — Telephone Encounter (Signed)
Lorazepam has been called to [facility]

## 2014-04-11 DIAGNOSIS — M25532 Pain in left wrist: Secondary | ICD-10-CM | POA: Diagnosis not present

## 2014-04-11 DIAGNOSIS — S52552D Other extraarticular fracture of lower end of left radius, subsequent encounter for closed fracture with routine healing: Secondary | ICD-10-CM | POA: Diagnosis not present

## 2014-04-21 ENCOUNTER — Ambulatory Visit: Payer: PRIVATE HEALTH INSURANCE | Admitting: Family Medicine

## 2014-04-21 ENCOUNTER — Emergency Department (HOSPITAL_BASED_OUTPATIENT_CLINIC_OR_DEPARTMENT_OTHER)
Admission: EM | Admit: 2014-04-21 | Discharge: 2014-04-21 | Disposition: A | Discharge status: Home / Self Care | Payer: Medicare Other | Attending: Emergency Medicine | Admitting: Emergency Medicine

## 2014-04-21 ENCOUNTER — Encounter (HOSPITAL_BASED_OUTPATIENT_CLINIC_OR_DEPARTMENT_OTHER): Payer: Self-pay | Admitting: *Deleted

## 2014-04-21 DIAGNOSIS — Z79899 Other long term (current) drug therapy: Secondary | ICD-10-CM | POA: Diagnosis not present

## 2014-04-21 DIAGNOSIS — Z86718 Personal history of other venous thrombosis and embolism: Secondary | ICD-10-CM | POA: Insufficient documentation

## 2014-04-21 DIAGNOSIS — W458XXA Other foreign body or object entering through skin, initial encounter: Secondary | ICD-10-CM | POA: Insufficient documentation

## 2014-04-21 DIAGNOSIS — Z87448 Personal history of other diseases of urinary system: Secondary | ICD-10-CM | POA: Insufficient documentation

## 2014-04-21 DIAGNOSIS — Z7902 Long term (current) use of antithrombotics/antiplatelets: Secondary | ICD-10-CM | POA: Diagnosis not present

## 2014-04-21 DIAGNOSIS — I2510 Atherosclerotic heart disease of native coronary artery without angina pectoris: Secondary | ICD-10-CM | POA: Insufficient documentation

## 2014-04-21 DIAGNOSIS — Z86711 Personal history of pulmonary embolism: Secondary | ICD-10-CM | POA: Diagnosis not present

## 2014-04-21 DIAGNOSIS — S99921A Unspecified injury of right foot, initial encounter: Secondary | ICD-10-CM | POA: Diagnosis present

## 2014-04-21 DIAGNOSIS — Y998 Other external cause status: Secondary | ICD-10-CM | POA: Diagnosis not present

## 2014-04-21 DIAGNOSIS — M1990 Unspecified osteoarthritis, unspecified site: Secondary | ICD-10-CM | POA: Insufficient documentation

## 2014-04-21 DIAGNOSIS — E669 Obesity, unspecified: Secondary | ICD-10-CM | POA: Insufficient documentation

## 2014-04-21 DIAGNOSIS — J449 Chronic obstructive pulmonary disease, unspecified: Secondary | ICD-10-CM | POA: Insufficient documentation

## 2014-04-21 DIAGNOSIS — X58XXXA Exposure to other specified factors, initial encounter: Secondary | ICD-10-CM | POA: Insufficient documentation

## 2014-04-21 DIAGNOSIS — Z9861 Coronary angioplasty status: Secondary | ICD-10-CM | POA: Insufficient documentation

## 2014-04-21 DIAGNOSIS — Z880 Allergy status to penicillin: Secondary | ICD-10-CM | POA: Diagnosis not present

## 2014-04-21 DIAGNOSIS — Z862 Personal history of diseases of the blood and blood-forming organs and certain disorders involving the immune mechanism: Secondary | ICD-10-CM | POA: Diagnosis not present

## 2014-04-21 DIAGNOSIS — Z87891 Personal history of nicotine dependence: Secondary | ICD-10-CM | POA: Diagnosis not present

## 2014-04-21 DIAGNOSIS — Z7901 Long term (current) use of anticoagulants: Secondary | ICD-10-CM | POA: Insufficient documentation

## 2014-04-21 DIAGNOSIS — K219 Gastro-esophageal reflux disease without esophagitis: Secondary | ICD-10-CM | POA: Insufficient documentation

## 2014-04-21 DIAGNOSIS — I481 Persistent atrial fibrillation: Secondary | ICD-10-CM | POA: Insufficient documentation

## 2014-04-21 DIAGNOSIS — Y9289 Other specified places as the place of occurrence of the external cause: Secondary | ICD-10-CM | POA: Diagnosis not present

## 2014-04-21 DIAGNOSIS — Y9389 Activity, other specified: Secondary | ICD-10-CM | POA: Insufficient documentation

## 2014-04-21 DIAGNOSIS — I10 Essential (primary) hypertension: Secondary | ICD-10-CM | POA: Diagnosis not present

## 2014-04-21 DIAGNOSIS — Z9889 Other specified postprocedural states: Secondary | ICD-10-CM | POA: Insufficient documentation

## 2014-04-21 DIAGNOSIS — Z7951 Long term (current) use of inhaled steroids: Secondary | ICD-10-CM | POA: Diagnosis not present

## 2014-04-21 DIAGNOSIS — S90851A Superficial foreign body, right foot, initial encounter: Secondary | ICD-10-CM | POA: Insufficient documentation

## 2014-04-21 DIAGNOSIS — Z792 Long term (current) use of antibiotics: Secondary | ICD-10-CM | POA: Insufficient documentation

## 2014-04-21 DIAGNOSIS — Z8701 Personal history of pneumonia (recurrent): Secondary | ICD-10-CM | POA: Diagnosis not present

## 2014-04-21 DIAGNOSIS — Z7952 Long term (current) use of systemic steroids: Secondary | ICD-10-CM | POA: Insufficient documentation

## 2014-04-21 MED ORDER — LIDOCAINE HCL 2 % IJ SOLN: 5.0000 mL | Freq: Once | INTRAMUSCULAR | Status: DC

## 2014-04-21 MED ORDER — LIDOCAINE HCL (PF) 1 % IJ SOLN
INTRAMUSCULAR | Status: AC
  Administered 2014-04-21: 2 mL
  Filled 2014-04-21: qty 5

## 2014-04-21 NOTE — ED Notes (Signed)
Wound cleaned and pressure dressing applied.

## 2014-04-21 NOTE — ED Provider Notes (Signed)
CSN: [id number]     Arrival date & time 04/21/14  0905 History   First MD Initiated Contact with Patient 04/21/14 0957     Chief Complaint  Patient presents with  . Foot Pain     (Consider location/radiation/quality/duration/timing/severity/associated sxs/prior Treatment) HPI Comments: 69 year old male with history of high blood pressure, cardiopathy, and Coumadin, arthritis presents with right heel pain since Thursday. Patient does not recall trauma injury, no fevers or chills. Only felt with direct pressure on walking. No swelling. No other symptoms  Patient is a 69 y.o. male presenting with lower extremity pain. The history is provided by the patient.  Foot Pain Pertinent negatives include no chest pain, no abdominal pain, no headaches and no shortness of breath.    Past Medical History  Diagnosis Date  . BPH (benign prostatic hypertrophy)   . Spinal stenosis     Congential  . Hypertension   . Asthma   . DVT (deep venous thrombosis)     a. 2010 Lower ext s/p back surgery.  . Pulmonary embolism     a. 2010 in setting of DVT post-op back surgery. b. Low prob VQ 07/2013.  [name] PAF (paroxysmal atrial fibrillation)     a. Flecainide discontinued 07/2013 in setting of CAD.  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 COPD (chronic obstructive pulmonary disease)     a. 07/2013 PFT's mild airflow obstruction, no restriction, sev decrease in DLCO.  [name] GERD (gastroesophageal reflux disease)   . Diverticulosis   . Hx of echocardiogram 2015    Echo (06/2013): EF 60-65% normal wall motion, normal diastolic function, aortic sclerosis without stenosis, Trivial MR, mild SAM due to long, redundant mitral leaflets, mild RAE, normal RVSF  . Dyspnea on exertion     a. 07/2013 PFT's mild airflow obstr   . CAD (coronary artery disease)     a. 07/2013: s/p DES to LAD, normal LVF.  . Macrocytic anemia     a. 07/2013: documented on prior labs.  . Obesity   . Transaminitis     a. 07/2013: mild.  . Complication of  anesthesia     "I got all kinds of hallucinations"  . Pneumonia ~ 2010 X 1  . History of hiatal hernia   . Arthritis   . History of gout    Past Surgical History  Procedure Laterality Date  . Vasectomy    . Lumbar fusion  02/2008  . Total hip arthroplasty Bilateral   . Cataract extraction w/ intraocular lens  implant, bilateral Bilateral   . Colonoscopy w/ polypectomy  2004  . Joint replacement    . Anterior lumbar disc arthroplasty  03/2008    "spacer poped out; had to repair"  . Coronary angioplasty with stent placement  08/08/2013    "1"  . Left and right heart catheterization with coronary angiogram N/A 08/07/2013    Procedure: LEFT AND RIGHT HEART CATHETERIZATION WITH CORONARY ANGIOGRAM;  Surgeon: Peter M Martinique, MD;  Location: [facility] CATH LAB;  Service: Cardiovascular;  Laterality: N/A;  . Percutaneous coronary stent intervention (pci-s)  08/07/2013    Procedure: PERCUTANEOUS CORONARY STENT INTERVENTION (PCI-S);  Surgeon: Peter M Martinique, MD;  Location: [facility] CATH LAB;  Service: Cardiovascular;;   Family History  Problem Relation Age of Onset  . Atrial fibrillation Mother   . COPD Mother   . Heart disease Father   . Colon cancer Father   . Heart attack Brother 57  . Alcohol abuse Brother 10    Died post  liver transplant   History  Substance Use Topics  . Smoking status: Former Smoker -- 2.00 packs/day for 40 years    Types: Cigarettes    Quit date: 03/09/2000  . Smokeless tobacco: Never Used     Comment: smoked 1964-2002, up to 3-4 ppd  . Alcohol Use: 0.0 oz/week    0 Standard drinks or equivalent per week     Comment: 02/08/2014 "drink a couple times/month"    Review of Systems  Constitutional: Negative for fever and chills.  Respiratory: Negative for shortness of breath.   Cardiovascular: Negative for chest pain.  Gastrointestinal: Negative for vomiting and abdominal pain.  Genitourinary: Negative for dysuria and flank pain.  Musculoskeletal: Positive for gait problem.  Negative for back pain, neck pain and neck stiffness.  Skin: Negative for rash.  Neurological: Negative for light-headedness and headaches.      Allergies  Penicillins  Home Medications   Prior to Admission medications   Medication Sig Start Date End Date Taking? Authorizing Provider  atorvastatin (LIPITOR) 10 MG tablet Take 1 tablet (10 mg total) by mouth every evening. 03/13/14  Yes Minus Breeding, MD  budesonide-formoterol [facility]) 160-4.5 MCG/ACT inhaler Inhale 2 puffs into the lungs 2 (two) times daily. 10/24/13  Yes Hendricks Limes, MD  clopidogrel (PLAVIX) 75 MG tablet Take 1 tablet (75 mg total) by mouth daily with breakfast. 03/13/14  Yes Minus Breeding, MD  LORazepam (ATIVAN) 1 MG tablet TAKE 1/2-1 TABLET BY MOUTH DAILY AS NEEDED ONLY 04/09/14  Yes Hendricks Limes, MD  nitroGLYCERIN (NITROSTAT) 0.4 MG SL tablet Place 1 tablet (0.4 mg total) under the tongue every 5 (five) minutes as needed for chest pain (up to 3 doses). 08/08/13  Yes Dayna N Dunn, PA-C  pantoprazole (PROTONIX) 40 MG tablet Take 1 tablet (40 mg total) by mouth at bedtime. 03/13/14  Yes Minus Breeding, MD  sertraline (ZOLOFT) 50 MG tablet Take 1 tablet (50 mg total) by mouth daily. 03/12/14  Yes Hendricks Limes, MD  sotalol (BETAPACE) 80 MG tablet Take 1 tablet (80 mg total) by mouth 2 (two) times daily. 03/12/14  Yes Hendricks Limes, MD  Umeclidinium-Vilanterol [facility] ELLIPTA) 62.5-25 MCG/INH AEPB Inhale 1 Inhaler into the lungs every morning. 01/22/14  Yes Hendricks Limes, MD  warfarin (COUMADIN) 3 MG tablet Take as directed by coumadin clinic 03/13/14  Yes Minus Breeding, MD  doxycycline (VIBRA-TABS) 100 MG tablet Take 1 tablet (100 mg total) by mouth 2 (two) times daily. 03/08/14   Hendricks Limes, MD  HYDROcodone-homatropine [facility]) 5-1.5 MG/5ML syrup Take 5 mLs by mouth every 8 (eight) hours as needed for cough. 03/13/14   Hendricks Limes, MD  predniSONE (DELTASONE) 20 MG tablet Take 1 tablet (20 mg total) by mouth  2 (two) times daily. 03/08/14   Hendricks Limes, MD   BP 133/72 mmHg  Pulse 73  Temp(Src) 97.6 F (36.4 C) (Oral)  Resp 20  Ht 6\' 2"  (1.88 m)  Wt 260 lb (117.935 kg)  BMI 33.37 kg/m2  SpO2 99% Physical Exam  Constitutional: He appears well-developed and well-nourished. No distress.  Cardiovascular: Normal rate.   Pulmonary/Chest: Effort normal.  Musculoskeletal: He exhibits tenderness. He exhibits no edema.  Neurological: He is alert.  Skin:  Patient has focal tenderness and superficial foreign body approximately 2 mm visualized and palpated on the heel of the right foot, no surrounding erythema or induration, no significant swelling to the foot or ankle.  Nursing note and vitals reviewed.  ED Course  Procedures (including critical care time) Foreign body removal, incision Indication right foot foreign body superficial plantar surface Lidocaine used for anesthetic. Patient understands risks and benefits including that he is on Coumadin. Superficial incision made to remove foreign body after lidocaine injected. Area cleaned prior to performing. No, dictation and bandage placed afterwards.  Labs Review Labs Reviewed - No data to display  Imaging Review No results found.   EKG Interpretation None      MDM   Final diagnoses:  Foreign body in right foot, initial encounter   Patient is superficial foreign body tender to palpation. Patient is on Coumadin however he would like me to perform bedside procedure to remove as it causes him pain every time he steps on it. Discussed to have to watch for bleeding and hold pressure and take it easy today.  Results and differential diagnosis were discussed with the patient/parent/guardian. Close follow up outpatient was discussed, comfortable with the plan.   Medications  lidocaine (PF) (XYLOCAINE) 1 % injection (2 mLs  Given 04/21/14 1132)    Filed Vitals:   04/21/14 0910  BP: 133/72  Pulse: 73  Temp: 97.6 F (36.4 C)   TempSrc: Oral  Resp: 20  Height: 6\' 2"  (1.88 m)  Weight: 260 lb (117.935 kg)  SpO2: 99%    Final diagnoses:  Foreign body in right foot, initial encounter         Mariea Clonts, MD 04/21/14 [phone]

## 2014-04-21 NOTE — Discharge Instructions (Signed)
Take Tylenol for pain, hold pressure to control any bleeding especially being on Coumadin. Watch for signs of infection.  If you were given medicines take as directed.  If you are on coumadin or contraceptives realize their levels and effectiveness is altered by many different medicines.  If you have any reaction (rash, tongues swelling, other) to the medicines stop taking and see a physician.   Please follow up as directed and return to the ER or see a physician for new or worsening symptoms.  Thank you. Filed Vitals:   04/21/14 0910  BP: 133/72  Pulse: 73  Temp: 97.6 F (36.4 C)  TempSrc: Oral  Resp: 20  Height: 6\' 2"  (1.88 m)  Weight: 260 lb (117.935 kg)  SpO2: 99%

## 2014-04-21 NOTE — ED Notes (Signed)
Pt reports right heel pain since Thursday- painful to walk- denies injury

## 2014-04-23 ENCOUNTER — Ambulatory Visit: Payer: Medicare Other | Admitting: Pharmacist Clinician (PhC)/ Clinical Pharmacy Specialist

## 2014-04-24 ENCOUNTER — Encounter: Payer: Self-pay | Admitting: Internal Medicine

## 2014-04-24 ENCOUNTER — Ambulatory Visit (INDEPENDENT_AMBULATORY_CARE_PROVIDER_SITE_OTHER): Payer: Medicare Other | Admitting: Internal Medicine

## 2014-04-24 VITALS — BP 126/80 | HR 61 | Temp 98.1°F | Ht 74.0 in | Wt 266.1 lb

## 2014-04-24 DIAGNOSIS — S90851D Superficial foreign body, right foot, subsequent encounter: Secondary | ICD-10-CM

## 2014-04-24 NOTE — Progress Notes (Signed)
   Subjective:    Patient ID: [name] [name], male    DOB: 02-25-1946, 69 y.o.   MRN: [id number]  HPI He was see in the ER 04/21/14 for pain in the right heel when he was bearing weight. The symptoms had been present since 2/11. There was no trigger or injury.  In emergency room he was found have a 2 mm foreign body which was removed after local lidocaine and superficial incision. It was acknowledged he was on warfarin but no bleeding complication occurred.   Review of Systems  Since the minor surgery he's had no fever, chills, sweats, purulent drainage.  There's been no sign of cellulitis or lymphangitis.  He has no pain in this area with weightbearing now.    Objective:   Physical Exam  Pertinent positive findings include: There is a tiny eschar over the right heel with no signs of cellulitis or purulence  No ecchymosis present Pedal pulses are decreased but equal. His feet are cool without ischemic change. His large large toenails are deformed.  General appearance :adequately nourished; in no distress. Eyes: No conjunctival inflammation or scleral icterus is present. Heart:  Normal rate and regular rhythm. S1 and S2 normal without gallop, murmur, click, rub or other extra sounds  Lungs:Chest clear to auscultation; no wheezes, rhonchi,rales ,or rubs present.No increased work of breathing.  Vascular : all pulses equal ; no bruits present. Skin:Warm & dry.  Intact without suspicious lesions or rashes ; no jaundice or tenting Lymphatic: No lymphadenopathy is noted about the head, neck, axilla Neuro: Strength, tone normal.         Assessment & Plan:  #1 foreign body ,S/P excision,w/o complication

## 2014-04-24 NOTE — Patient Instructions (Signed)
Your tetanus shot is good until 2019.  It is not expected; but report any pustule formation; fever; or red streaks going up the ankle

## 2014-04-24 NOTE — Progress Notes (Signed)
Pre visit review using our clinic review tool, if applicable. No additional management support is needed unless otherwise documented below in the visit note. 

## 2014-04-25 ENCOUNTER — Telehealth: Payer: Self-pay | Admitting: *Deleted

## 2014-04-25 NOTE — Telephone Encounter (Signed)
[place] Night - Client TELEPHONE [facility] Patient Name: [name] [name] Gender: Male DOB: [birth year] Age: 69 Y 1 M 3 D Return Phone Number: [phone] (Primary), [phone] (Alternate) Address: [address] City/State/Zip: [place] [address] Client [facility] Night - Client Client Site [place] - Night Physician [place], [place] Type Call Call Type Triage / Clinical Caller Name Santiago Glad cCarthy Relationship To Patient Spouse Return Phone Number [phone] (Primary) Chief Complaint Foot Pain Initial Comment Caller states, husband wants an appt today, foot hurts for 2 days, last night noticed something may be in the heel. PreDisposition Did not know what to do Nurse Assessment Nurse: Wayne Sever, RN, Tillie Rung Date/Time (Eastern Time): 04/21/2014 8:35:29 AM Confirm and document reason for call. If symptomatic, describe symptoms. ---Caller states, "He has pain in the right foot, in the heel." "It looks kind of red." Wife states, "I think he should come in." Has not taken temperature Has the patient traveled out of the country within the last 30 days? ---No Does the patient require triage? ---Yes Related visit to physician within the last 2 weeks? ---No Does the PT have any chronic conditions? (i.e. diabetes, asthma, etc.) ---Yes List chronic conditions. ---A-Fib, Hypertension, High Cholesterol, Stent in heart in June, Guidelines Guideline Title Affirmed Question Affirmed Notes Nurse Date/Time Eilene Ghazi Time) Foot Pain [1] SEVERE pain (e.g., excruciating, unable to do any normal activities) AND [2] not improved after 2 hours of pain medicine Wayne Sever, RN, Tillie Rung 04/21/2014 8:38:48 AM Disp. Time Eilene Ghazi Time) Disposition Final User 04/21/2014 8:40:18 AM See Physician within 4 Hours (or PCP triage) Yes Wayne Sever, RN, Tillie Rung PLEASE NOTE: All timestamps contained within this report are  represented as [place] Standard Time. CONFIDENTIALTY NOTICE: This fax transmission is intended only for the addressee. It contains information that is legally privileged, confidential or otherwise protected from use or disclosure. If you are not the intended recipient, you are strictly prohibited from reviewing, disclosing, copying using or disseminating any of this information or taking any action in reliance on or regarding this information. If you have received this fax in error, please notify us immediately by telephone so that we can arrange for its return to [place]. Phone: [phone], Toll-Free: [phone], Fax: [phone] Page: 2 of 2 Call Id: [id number] [place] Understands: Yes Disagree/Comply: Comply Care Advice Given Per Guideline SEE PHYSICIAN WITHIN 4 HOURS (or PCP triage): * IF NO PCP TRIAGE: You need to be seen. Go to _______________ (ED/U[facility] or office if it will be open) within the next 3 or 4 hours. Go sooner if you become worse. CALL BACK IF: * You become worse. CARE ADVICE given per Foot Pain (Adult) guideline. After Care Instructions Given Call Event Type User Date / Time Description Referrals GO TO FACILITY UNDECIDED REFERRED TO PCP OFFICE

## 2014-04-30 ENCOUNTER — Ambulatory Visit (INDEPENDENT_AMBULATORY_CARE_PROVIDER_SITE_OTHER): Payer: Medicare Other | Admitting: Pharmacist Clinician (PhC)/ Clinical Pharmacy Specialist

## 2014-04-30 DIAGNOSIS — M12532 Traumatic arthropathy, left wrist: Secondary | ICD-10-CM | POA: Diagnosis not present

## 2014-04-30 DIAGNOSIS — I4891 Unspecified atrial fibrillation: Secondary | ICD-10-CM

## 2014-04-30 DIAGNOSIS — Z86718 Personal history of other venous thrombosis and embolism: Secondary | ICD-10-CM | POA: Diagnosis not present

## 2014-04-30 DIAGNOSIS — I480 Paroxysmal atrial fibrillation: Secondary | ICD-10-CM | POA: Diagnosis not present

## 2014-04-30 DIAGNOSIS — S6992XD Unspecified injury of left wrist, hand and finger(s), subsequent encounter: Secondary | ICD-10-CM | POA: Diagnosis not present

## 2014-04-30 LAB — POCT INR: INR: 3

## 2014-05-21 ENCOUNTER — Emergency Department (HOSPITAL_COMMUNITY)
Admission: EM | Admit: 2014-05-21 | Discharge: 2014-05-22 | Disposition: A | Discharge status: Home / Self Care | Payer: Medicare Other | Attending: Emergency Medicine | Admitting: Emergency Medicine

## 2014-05-21 ENCOUNTER — Encounter (HOSPITAL_COMMUNITY): Payer: Self-pay | Admitting: Emergency Medicine

## 2014-05-21 ENCOUNTER — Emergency Department (HOSPITAL_COMMUNITY): Payer: Medicare Other

## 2014-05-21 DIAGNOSIS — Z86711 Personal history of pulmonary embolism: Secondary | ICD-10-CM | POA: Diagnosis not present

## 2014-05-21 DIAGNOSIS — Z9889 Other specified postprocedural states: Secondary | ICD-10-CM | POA: Diagnosis not present

## 2014-05-21 DIAGNOSIS — Z8701 Personal history of pneumonia (recurrent): Secondary | ICD-10-CM | POA: Insufficient documentation

## 2014-05-21 DIAGNOSIS — J449 Chronic obstructive pulmonary disease, unspecified: Secondary | ICD-10-CM | POA: Diagnosis not present

## 2014-05-21 DIAGNOSIS — I480 Paroxysmal atrial fibrillation: Secondary | ICD-10-CM | POA: Insufficient documentation

## 2014-05-21 DIAGNOSIS — J45909 Unspecified asthma, uncomplicated: Secondary | ICD-10-CM | POA: Insufficient documentation

## 2014-05-21 DIAGNOSIS — Z880 Allergy status to penicillin: Secondary | ICD-10-CM | POA: Insufficient documentation

## 2014-05-21 DIAGNOSIS — M1990 Unspecified osteoarthritis, unspecified site: Secondary | ICD-10-CM | POA: Diagnosis not present

## 2014-05-21 DIAGNOSIS — I714 Abdominal aortic aneurysm, without rupture, unspecified: Secondary | ICD-10-CM

## 2014-05-21 DIAGNOSIS — Z7902 Long term (current) use of antithrombotics/antiplatelets: Secondary | ICD-10-CM | POA: Insufficient documentation

## 2014-05-21 DIAGNOSIS — E669 Obesity, unspecified: Secondary | ICD-10-CM | POA: Insufficient documentation

## 2014-05-21 DIAGNOSIS — M549 Dorsalgia, unspecified: Secondary | ICD-10-CM | POA: Diagnosis not present

## 2014-05-21 DIAGNOSIS — I2510 Atherosclerotic heart disease of native coronary artery without angina pectoris: Secondary | ICD-10-CM | POA: Diagnosis not present

## 2014-05-21 DIAGNOSIS — Z7951 Long term (current) use of inhaled steroids: Secondary | ICD-10-CM | POA: Diagnosis not present

## 2014-05-21 DIAGNOSIS — Z7901 Long term (current) use of anticoagulants: Secondary | ICD-10-CM | POA: Insufficient documentation

## 2014-05-21 DIAGNOSIS — M545 Low back pain: Secondary | ICD-10-CM | POA: Diagnosis not present

## 2014-05-21 DIAGNOSIS — Z87891 Personal history of nicotine dependence: Secondary | ICD-10-CM | POA: Insufficient documentation

## 2014-05-21 DIAGNOSIS — Z87448 Personal history of other diseases of urinary system: Secondary | ICD-10-CM | POA: Diagnosis not present

## 2014-05-21 DIAGNOSIS — Z862 Personal history of diseases of the blood and blood-forming organs and certain disorders involving the immune mechanism: Secondary | ICD-10-CM | POA: Insufficient documentation

## 2014-05-21 DIAGNOSIS — K219 Gastro-esophageal reflux disease without esophagitis: Secondary | ICD-10-CM | POA: Diagnosis not present

## 2014-05-21 DIAGNOSIS — Z79899 Other long term (current) drug therapy: Secondary | ICD-10-CM | POA: Insufficient documentation

## 2014-05-21 DIAGNOSIS — I10 Essential (primary) hypertension: Secondary | ICD-10-CM | POA: Insufficient documentation

## 2014-05-21 DIAGNOSIS — Z9861 Coronary angioplasty status: Secondary | ICD-10-CM | POA: Diagnosis not present

## 2014-05-21 DIAGNOSIS — I722 Aneurysm of renal artery: Secondary | ICD-10-CM | POA: Diagnosis not present

## 2014-05-21 DIAGNOSIS — S29002A Unspecified injury of muscle and tendon of back wall of thorax, initial encounter: Secondary | ICD-10-CM | POA: Diagnosis not present

## 2014-05-21 DIAGNOSIS — Z86718 Personal history of other venous thrombosis and embolism: Secondary | ICD-10-CM | POA: Diagnosis not present

## 2014-05-21 DIAGNOSIS — Z981 Arthrodesis status: Secondary | ICD-10-CM | POA: Diagnosis not present

## 2014-05-21 DIAGNOSIS — M47816 Spondylosis without myelopathy or radiculopathy, lumbar region: Secondary | ICD-10-CM | POA: Diagnosis not present

## 2014-05-21 MED ORDER — DIAZEPAM 5 MG/ML IJ SOLN
5.0000 mg | Freq: Once | INTRAMUSCULAR | Status: AC
  Administered 2014-05-21: 5 mg via INTRAMUSCULAR
  Filled 2014-05-21: qty 2

## 2014-05-21 MED ORDER — OXYCODONE-ACETAMINOPHEN 5-325 MG PO TABS
2.0000 | ORAL_TABLET | Freq: Once | ORAL | Status: DC
  Filled 2014-05-21: qty 2

## 2014-05-21 MED ORDER — MORPHINE SULFATE 4 MG/ML IJ SOLN
4.0000 mg | Freq: Once | INTRAMUSCULAR | Status: AC
Start: 2014-05-22 — End: 2014-05-22
  Administered 2014-05-22: 4 mg via INTRAVENOUS
  Filled 2014-05-21: qty 1

## 2014-05-21 MED ORDER — MORPHINE SULFATE 4 MG/ML IJ SOLN
4.0000 mg | Freq: Once | INTRAMUSCULAR | Status: AC
  Administered 2014-05-21: 4 mg via INTRAMUSCULAR
  Filled 2014-05-21: qty 1

## 2014-05-21 NOTE — ED Notes (Signed)
Bed: WA01 Expected date:  Expected time:  Means of arrival:  Comments: EMS 69yo M back pain x several days

## 2014-05-21 NOTE — ED Notes (Addendum)
Per PTAR, patient from home, A&Ox4, c/o back pain x3 days, denies injury. Patient states his pain is 5/10 when laying flat, 7/10 when being moved. Patient states he has been having to walk hunched over due to the pain. Patient is medial to lumbar, but does shift right to left. Patient reports a history of back problems from 2009-2010, with no pain since that time. Patient states he was sitting on the toilet and suddenly became in pain. Denies injury, denies heavy lifting, denies, falling. Reports constant tingling to left leg since 2009, no new numbness or tingling to legs. Patient reports pain is located to the right side and radiates to the spine, denies radiation to legs. Patient states he has taken two doses of tylenol with codeine today, last dose at 1500.

## 2014-05-21 NOTE — ED Notes (Signed)
Patient transported to X-ray 

## 2014-05-21 NOTE — ED Provider Notes (Signed)
CSN: [id number]     Arrival date & time 05/21/14  2128 History   First MD Initiated Contact with Patient 05/21/14 2132     Chief Complaint  Patient presents with  . Back Pain     (Consider location/radiation/quality/duration/timing/severity/associated sxs/prior Treatment) HPI Comments: Patient with past medical history of back pain with multiple back surgeries presents to the emergency department with chief complaint of low back pain. Patient states that he has had intermittent low back pain for the past 3 days which has been worse than normal. He states that today when he stood up from the toilet he experienced severe sharp right lower back pain. He states that the pain as a stabbing sensation. He denies any fevers, chills, nausea, vomiting, dysuria, or weakness. He denies any new numbness or tingling. States that he has constant tingling to the left leg, and this is unchanged. He denies any bowel or bladder incontinence. Symptoms are aggravated with movement and palpation. He has tried taking Tylenol 3 with some relief.  The history is provided by the patient. No language interpreter was used.    Past Medical History  Diagnosis Date  . BPH (benign prostatic hypertrophy)   . Spinal stenosis     Congential  . Hypertension   . Asthma   . DVT (deep venous thrombosis)     a. 2010 Lower ext s/p back surgery.  . Pulmonary embolism     a. 2010 in setting of DVT post-op back surgery. b. Low prob VQ 07/2013.  [name] PAF (paroxysmal atrial fibrillation)     a. Flecainide discontinued 07/2013 in setting of CAD.  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 COPD (chronic obstructive pulmonary disease)     a. 07/2013 PFT's mild airflow obstruction, no restriction, sev decrease in DLCO.  [name] GERD (gastroesophageal reflux disease)   . Diverticulosis   . Hx of echocardiogram 2015    Echo (06/2013): EF 60-65% normal wall motion, normal diastolic function, aortic sclerosis without stenosis, Trivial MR, mild SAM due to  long, redundant mitral leaflets, mild RAE, normal RVSF  . Dyspnea on exertion     a. 07/2013 PFT's mild airflow obstr   . CAD (coronary artery disease)     a. 07/2013: s/p DES to LAD, normal LVF.  . Macrocytic anemia     a. 07/2013: documented on prior labs.  . Obesity   . Transaminitis     a. 07/2013: mild.  . Complication of anesthesia     "I got all kinds of hallucinations"  . Pneumonia ~ 2010 X 1  . History of hiatal hernia   . Arthritis   . History of gout    Past Surgical History  Procedure Laterality Date  . Vasectomy    . Lumbar fusion  02/2008  . Total hip arthroplasty Bilateral   . Cataract extraction w/ intraocular lens  implant, bilateral Bilateral   . Colonoscopy w/ polypectomy  2004  . Joint replacement    . Anterior lumbar disc arthroplasty  03/2008    "spacer poped out; had to repair"  . Coronary angioplasty with stent placement  08/08/2013    "1"  . Left and right heart catheterization with coronary angiogram N/A 08/07/2013    Procedure: LEFT AND RIGHT HEART CATHETERIZATION WITH CORONARY ANGIOGRAM;  Surgeon: Peter M Martinique, MD;  Location: [facility] CATH LAB;  Service: Cardiovascular;  Laterality: N/A;  . Percutaneous coronary stent intervention (pci-s)  08/07/2013    Procedure: PERCUTANEOUS CORONARY STENT INTERVENTION (PCI-S);  Surgeon: Collier Salina  M Martinique, MD;  Location: [facility] CATH LAB;  Service: Cardiovascular;;   Family History  Problem Relation Age of Onset  . Atrial fibrillation Mother   . COPD Mother   . Heart disease Father   . Colon cancer Father   . Heart attack Brother 53  . Alcohol abuse Brother 5    Died post liver transplant   History  Substance Use Topics  . Smoking status: Former Smoker -- 2.00 packs/day for 40 years    Types: Cigarettes    Quit date: 03/09/2000  . Smokeless tobacco: Never Used     Comment: smoked 1964-2002, up to 3-4 ppd  . Alcohol Use: 0.0 oz/week    0 Standard drinks or equivalent per week     Comment: 02/08/2014 "drink a couple  times/month"    Review of Systems  Constitutional: Negative for fever and chills.  Respiratory: Negative for shortness of breath.   Cardiovascular: Negative for chest pain.  Gastrointestinal: Negative for nausea, vomiting, diarrhea and constipation.       No bowel incontinence  Genitourinary: Negative for dysuria.       No urinary incontinence  Musculoskeletal: Positive for myalgias, back pain and arthralgias.  Neurological:       No saddle anesthesia  All other systems reviewed and are negative.     Allergies  Penicillins  Home Medications   Prior to Admission medications   Medication Sig Start Date End Date Taking? Authorizing Provider  atorvastatin (LIPITOR) 10 MG tablet Take 1 tablet (10 mg total) by mouth every evening. Patient taking differently: Take 10 mg by mouth daily.  03/13/14  Yes Minus Breeding, MD  budesonide-formoterol [facility]) 160-4.5 MCG/ACT inhaler Inhale 2 puffs into the lungs 2 (two) times daily. 10/24/13  Yes Hendricks Limes, MD  clopidogrel (PLAVIX) 75 MG tablet Take 1 tablet (75 mg total) by mouth daily with breakfast. 03/13/14  Yes Minus Breeding, MD  LORazepam (ATIVAN) 1 MG tablet TAKE 1/2-1 TABLET BY MOUTH DAILY AS NEEDED ONLY 04/09/14  Yes Hendricks Limes, MD  pantoprazole (PROTONIX) 40 MG tablet Take 1 tablet (40 mg total) by mouth at bedtime. 03/13/14  Yes Minus Breeding, MD  sertraline (ZOLOFT) 50 MG tablet Take 1 tablet (50 mg total) by mouth daily. 03/12/14  Yes Hendricks Limes, MD  sotalol (BETAPACE) 80 MG tablet Take 1 tablet (80 mg total) by mouth 2 (two) times daily. 03/12/14  Yes Hendricks Limes, MD  Umeclidinium-Vilanterol [facility] ELLIPTA) 62.5-25 MCG/INH AEPB Inhale 1 Inhaler into the lungs every morning. 01/22/14  Yes Hendricks Limes, MD  warfarin (COUMADIN) 3 MG tablet Take as directed by coumadin clinic Patient taking differently: Take 3-6 mg by mouth daily. Take 1.5 tablets on (Mon, Tues, Wed, Fri, Sat) & Take 2 tablets on (Sun & Thurs) 03/13/14   Yes Minus Breeding, MD  nitroGLYCERIN (NITROSTAT) 0.4 MG SL tablet Place 1 tablet (0.4 mg total) under the tongue every 5 (five) minutes as needed for chest pain (up to 3 doses). 08/08/13   Dayna N Dunn, PA-C   BP 162/77 mmHg  Pulse 53  Temp(Src) 98.3 F (36.8 C) (Oral)  Resp 18  SpO2 99% Physical Exam  Constitutional: He is oriented to person, place, and time. He appears well-developed and well-nourished. No distress.  HENT:  Head: Normocephalic and atraumatic.  Eyes: Conjunctivae and EOM are normal. Right eye exhibits no discharge. Left eye exhibits no discharge. No scleral icterus.  Neck: Normal range of motion. Neck supple. No tracheal  deviation present.  Cardiovascular: Normal rate, regular rhythm and normal heart sounds.  Exam reveals no gallop and no friction rub.   No murmur heard. Pulmonary/Chest: Effort normal and breath sounds normal. No respiratory distress. He has no wheezes.  Abdominal: Soft. He exhibits no distension. There is no tenderness.  Musculoskeletal: Normal range of motion.  Right lumbar paraspinal muscles tender to palpation, no bony tenderness, step-offs, or gross abnormality or deformity of spine, patient is able to ambulate, moves all extremities  Bilateral great toe extension intact Bilateral plantar/dorsiflexion intact  Neurological: He is alert and oriented to person, place, and time. He has normal reflexes.  Sensation and strength intact bilaterally Symmetrical reflexes  Skin: Skin is warm. He is not diaphoretic.  Psychiatric: He has a normal mood and affect. His behavior is normal. Judgment and thought content normal.  Nursing note and vitals reviewed.   ED Course  Procedures (including critical care time) Results for orders placed or performed during the hospital encounter of 05/21/14  CBC with Differential/Platelet  Result Value Ref Range   WBC 13.3 (H) 4.0 - 10.5 K/uL   RBC 3.27 (L) 4.22 - 5.81 MIL/uL   Hemoglobin 10.9 (L) 13.0 - 17.0 g/dL    HCT 33.5 (L) 39.0 - 52.0 %   MCV 102.4 (H) 78.0 - 100.0 fL   MCH 33.3 26.0 - 34.0 pg   MCHC 32.5 30.0 - 36.0 g/dL   RDW 16.1 (H) 11.5 - 15.5 %   Platelets 413 (H) 150 - 400 K/uL   Neutrophils Relative % 73 43 - 77 %   Neutro Abs 9.7 (H) 1.7 - 7.7 K/uL   Lymphocytes Relative 18 12 - 46 %   Lymphs Abs 2.4 0.7 - 4.0 K/uL   Monocytes Relative 8 3 - 12 %   Monocytes Absolute 1.1 (H) 0.1 - 1.0 K/uL   Eosinophils Relative 1 0 - 5 %   Eosinophils Absolute 0.2 0.0 - 0.7 K/uL   Basophils Relative 0 0 - 1 %   Basophils Absolute 0.0 0.0 - 0.1 K/uL   Dg Lumbar Spine Complete  05/21/2014   CLINICAL DATA:  Acute onset of severe lower back pain, worse on the right. Initial encounter.  EXAM: LUMBAR SPINE - COMPLETE 4+ VIEW  COMPARISON:  MRI of the lumbar spine performed 04/24/2013, and CT of the abdomen and pelvis performed 04/19/2013  FINDINGS: There is no evidence of fracture or subluxation. The patient is status post lumbar spinal fusion at L2-L5, with posterior pedicle screws involving L2, L4 and L5, and anterior fusion at L4-L5. Vertebral bodies demonstrate normal height and alignment. Endplate sclerotic change and anterior osteophytes are seen along the lower thoracic spine.  The visualized bowel gas pattern is unremarkable in appearance; air and stool are noted within the colon. The sacroiliac joints are within normal limits.  IMPRESSION: 1. No evidence of acute fracture or subluxation along the lumbar spine. 2. Status post lumbar spinal fusion at L2-L5. Mild degenerative change along the lower thoracic spine.   Electronically Signed   By: Garald Balding M.D.   On: 05/21/2014 23:42      EKG Interpretation None      MDM   Final diagnoses:  None    Patient with severe right lower back pain. No mechanism of injury, other than standing up from using the toilet. Describes pain as sharp. No new numbness or tingling. No bowel or bladder continence. No fevers. No weakness. Will treat pain and will  check plain  films given prior surgical history. Will reassess.  Patient reassessed after pain medicine. Feels improved. Plain films pending.  11:53 PM Plain films are negative. Patient is still having a significant amount of pain. Patient seen by and discussed with Dr. Tomi Bamberger, who recommends CT angiogram of the abdomen to rule out dissection and lumbar MRI given increased back pain and prior surgeries. Will treat with additional morphine.   12:45 AM  Patient signed out to Olean Ree, NP.   Plan:  Follow-up on MRI and CT.    Montine Circle, PA-C 05/22/14 [phone]

## 2014-05-22 ENCOUNTER — Emergency Department (HOSPITAL_COMMUNITY): Payer: Medicare Other

## 2014-05-22 ENCOUNTER — Encounter (HOSPITAL_COMMUNITY): Payer: Self-pay

## 2014-05-22 DIAGNOSIS — I722 Aneurysm of renal artery: Secondary | ICD-10-CM | POA: Diagnosis not present

## 2014-05-22 DIAGNOSIS — M545 Low back pain: Secondary | ICD-10-CM | POA: Diagnosis not present

## 2014-05-22 DIAGNOSIS — Z981 Arthrodesis status: Secondary | ICD-10-CM | POA: Diagnosis not present

## 2014-05-22 LAB — BASIC METABOLIC PANEL
Anion gap: 7 (ref 5–15)
BUN: 14 mg/dL (ref 6–23)
CHLORIDE: 105 mmol/L (ref 96–112)
CO2: 26 mmol/L (ref 19–32)
Calcium: 8.7 mg/dL (ref 8.4–10.5)
Creatinine, Ser: 0.99 mg/dL (ref 0.50–1.35)
GFR calc non Af Amer: 81 mL/min — ABNORMAL LOW (ref >90)
Glucose, Bld: 116 mg/dL — ABNORMAL HIGH (ref 70–99)
Potassium: 4 mmol/L (ref 3.5–5.1)
SODIUM: 138 mmol/L (ref 135–145)

## 2014-05-22 LAB — CBC WITH DIFFERENTIAL/PLATELET
BASOS PCT: 0 % (ref 0–1)
Basophils Absolute: 0 10*3/uL (ref 0.0–0.1)
EOS ABS: 0.2 10*3/uL (ref 0.0–0.7)
Eosinophils Relative: 1 % (ref 0–5)
HCT: 33.5 % — ABNORMAL LOW (ref 39.0–52.0)
Hemoglobin: 10.9 g/dL — ABNORMAL LOW (ref 13.0–17.0)
Lymphocytes Relative: 18 % (ref 12–46)
Lymphs Abs: 2.4 10*3/uL (ref 0.7–4.0)
MCH: 33.3 pg (ref 26.0–34.0)
MCHC: 32.5 g/dL (ref 30.0–36.0)
MCV: 102.4 fL — ABNORMAL HIGH (ref 78.0–100.0)
Monocytes Absolute: 1.1 10*3/uL — ABNORMAL HIGH (ref 0.1–1.0)
Monocytes Relative: 8 % (ref 3–12)
NEUTROS ABS: 9.7 10*3/uL — AB (ref 1.7–7.7)
NEUTROS PCT: 73 % (ref 43–77)
PLATELETS: 413 10*3/uL — AB (ref 150–400)
RBC: 3.27 MIL/uL — ABNORMAL LOW (ref 4.22–5.81)
RDW: 16.1 % — ABNORMAL HIGH (ref 11.5–15.5)
WBC: 13.3 10*3/uL — ABNORMAL HIGH (ref 4.0–10.5)

## 2014-05-22 MED ORDER — HYDROMORPHONE HCL 2 MG/ML IJ SOLN
2.0000 mg | Freq: Once | INTRAMUSCULAR | Status: AC
  Administered 2014-05-22: 2 mg via INTRAMUSCULAR
  Filled 2014-05-22: qty 1

## 2014-05-22 MED ORDER — IOHEXOL 350 MG/ML SOLN
100.0000 mL | Freq: Once | INTRAVENOUS | Status: AC | PRN
  Administered 2014-05-22: 100 mL via INTRAVENOUS

## 2014-05-22 MED ORDER — MORPHINE SULFATE 4 MG/ML IJ SOLN
4.0000 mg | Freq: Once | INTRAMUSCULAR | Status: AC
Start: 2014-05-22 — End: 2014-05-22
  Administered 2014-05-22: 4 mg via INTRAVENOUS
  Filled 2014-05-22: qty 1

## 2014-05-22 MED ORDER — DIAZEPAM 5 MG/ML IJ SOLN
5.0000 mg | Freq: Once | INTRAMUSCULAR | Status: AC
  Administered 2014-05-22: 5 mg via INTRAVENOUS
  Filled 2014-05-22: qty 2

## 2014-05-22 MED ORDER — DIAZEPAM 5 MG PO TABS: 5.0000 mg | ORAL_TABLET | Freq: Two times a day (BID) | ORAL | Status: DC | PRN

## 2014-05-22 MED ORDER — OXYCODONE-ACETAMINOPHEN 5-325 MG PO TABS: 1.0000 | ORAL_TABLET | Freq: Four times a day (QID) | ORAL | Status: DC | PRN

## 2014-05-22 NOTE — ED Notes (Signed)
Patient transported to CT 

## 2014-05-22 NOTE — ED Notes (Signed)
Patient transported to MRI 

## 2014-05-22 NOTE — ED Provider Notes (Signed)
  6:00 PM Patient with a history of AAA and back pain s/p lumbar fusion presents today with lower back pain.  CT angio showing that the AAA is stable at 3 cm.  MRI pending.     MRI lumbar spine results as follows: IMPRESSION: Unchanged postoperative appearance of the lumbar spine as above. There is moderate spinal stenosis and right greater than left lateral recess stenosis at L3-4.  9:44 AM Reassessed patient.  He reports that his pain has significantly improved at this time.  Patient able to move without pain.  He is requesting discharge.   10:00 AM Upon discharge patient now reports significant pain and states that he is unable to get out of bed.  However, he reports that he does not want to be admitted for pain control.  Will order another dose of pain medication and reassess.  11:30 AM Reassessed patient.  Patient is now able to move with only little pain.  Patient able to get out of bed and stand up.  He now feels that he is stable for discharge.  Return precautions given.    Hyman Bible, PA-C 05/23/14 2127  Orpah Greek, MD 05/26/14 [phone]

## 2014-05-22 NOTE — ED Notes (Signed)
Attempted to get pt up for discharge.  Pt barely moving in bed and states he "cannot do it".  Notified PA.

## 2014-05-22 NOTE — ED Notes (Signed)
[phone] cell phone Elie Goody) Password: "Sharrie Rothman"

## 2014-05-23 ENCOUNTER — Telehealth: Payer: Self-pay | Admitting: Internal Medicine

## 2014-05-23 NOTE — Telephone Encounter (Signed)
Patient wife stated that she need a name of a Chief of Staff and Orthopedic Doctor. And he need a pneumo shot and a shingles can she get it at the time of visit.

## 2014-05-23 NOTE — Telephone Encounter (Signed)
Patient stated that her husband need a orthopedic doctor for his back, Dr Para March only work with knees and hips

## 2014-05-23 NOTE — Telephone Encounter (Signed)
Patient's wife has been advised.

## 2014-05-23 NOTE — Telephone Encounter (Signed)
Dr Nelva Bush or Dr Ron Agee

## 2014-05-23 NOTE — Telephone Encounter (Signed)
Patient 's wife has been notified  

## 2014-05-23 NOTE — Telephone Encounter (Signed)
Dr Harley Hallmark  [facility] is very good Dr Archie Endo  Ortho group also good Shingles & PNA not given together

## 2014-05-25 ENCOUNTER — Telehealth: Payer: Self-pay | Admitting: Internal Medicine

## 2014-05-25 ENCOUNTER — Inpatient Hospital Stay: Payer: Medicare Other | Admitting: Internal Medicine

## 2014-05-25 NOTE — Telephone Encounter (Signed)
Patient wife stated that Dr Joya Salm Neuro surgeon declined to see him, please advise

## 2014-05-28 ENCOUNTER — Ambulatory Visit (INDEPENDENT_AMBULATORY_CARE_PROVIDER_SITE_OTHER): Payer: Medicare Other | Admitting: Pharmacist Clinician (PhC)/ Clinical Pharmacy Specialist

## 2014-05-28 DIAGNOSIS — I480 Paroxysmal atrial fibrillation: Secondary | ICD-10-CM

## 2014-05-28 DIAGNOSIS — I4891 Unspecified atrial fibrillation: Secondary | ICD-10-CM | POA: Diagnosis not present

## 2014-05-28 DIAGNOSIS — Z86718 Personal history of other venous thrombosis and embolism: Secondary | ICD-10-CM | POA: Diagnosis not present

## 2014-05-28 LAB — POCT INR: INR: 2.7

## 2014-05-28 NOTE — Telephone Encounter (Signed)
Patient 's wife has been notified  

## 2014-05-28 NOTE — Telephone Encounter (Signed)
Because his been treated at [facility]; it will be difficult to establish with a local neurosurgeon unless he is referred by the [facility] and they provide complete records.  There are 2 nonoperative back specialist in town who are very good Dr. Nelva Bush & Dr Ron Agee who might be willing to see him.

## 2014-05-29 DIAGNOSIS — M4806 Spinal stenosis, lumbar region: Secondary | ICD-10-CM | POA: Diagnosis not present

## 2014-05-30 ENCOUNTER — Encounter: Payer: Self-pay | Admitting: Internal Medicine

## 2014-05-30 ENCOUNTER — Ambulatory Visit (INDEPENDENT_AMBULATORY_CARE_PROVIDER_SITE_OTHER): Payer: Medicare Other | Admitting: Internal Medicine

## 2014-05-30 ENCOUNTER — Telehealth: Payer: Self-pay | Admitting: *Deleted

## 2014-05-30 VITALS — BP 110/70 | HR 77 | Temp 97.8°F | Ht 74.0 in

## 2014-05-30 DIAGNOSIS — M48061 Spinal stenosis, lumbar region without neurogenic claudication: Secondary | ICD-10-CM | POA: Insufficient documentation

## 2014-05-30 DIAGNOSIS — I714 Abdominal aortic aneurysm, without rupture, unspecified: Secondary | ICD-10-CM

## 2014-05-30 DIAGNOSIS — M4806 Spinal stenosis, lumbar region: Secondary | ICD-10-CM | POA: Diagnosis not present

## 2014-05-30 DIAGNOSIS — M12532 Traumatic arthropathy, left wrist: Secondary | ICD-10-CM | POA: Diagnosis not present

## 2014-05-30 DIAGNOSIS — R935 Abnormal findings on diagnostic imaging of other abdominal regions, including retroperitoneum: Secondary | ICD-10-CM | POA: Insufficient documentation

## 2014-05-30 DIAGNOSIS — S6992XD Unspecified injury of left wrist, hand and finger(s), subsequent encounter: Secondary | ICD-10-CM | POA: Diagnosis not present

## 2014-05-30 NOTE — Patient Instructions (Signed)
Aortic aneurysm present. Most important is to control associated risk factors: blood pressure (goal = average < 130/80) , cholesterol, smoking,& diabetes. Annual monitor of AAA to verify it remains below critical size of 5 cm; it is 3 cm now.  The best exercises for the low back include freestyle swimming, stretch aerobics, and yoga.Cybex & Nautilus machines rather than dead weights are better for the back.

## 2014-05-30 NOTE — Assessment & Plan Note (Signed)
As per Dr Nelva Bush

## 2014-05-30 NOTE — Telephone Encounter (Signed)
Patient came by the office requesting information on how long to be off coumadin for hand surgery.  RN called patient's him spoke to wife. She states Dr Amedeo Plenty will be doing left hand surgery. Not sure of what type and if it has been schedule? Not sure if patient needs to see Dr Percival Spanish prior to surgery?  RN asked wife to have patient to call tomorrow with further information and speak to triage nurse After that, can route to Dr Percival Spanish

## 2014-05-30 NOTE — Progress Notes (Signed)
   Subjective:    Patient ID: [name] [name], male    DOB: 08-26-1945, 69 y.o.   MRN: [id number]  HPI He was seen in emergency room at [facility] 05/22/14 with intractable pain related to his spinal stenosis. He's had 2 surgical procedures at [facility] by Dr. Owens Shark . MRI 3/15 shows moderate spinal stenosis with lateral recess stenosis at L3-4; changes are greater on the right than the left. On CT there was a stable 3 cm infrarenal aortic aneurysm. 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 Incidental findings included 1.4 cm right hepatic cyst as well as multiple renal cysts. He was discharged from the ER with Valium and Percocet. He has not taken either since 3/21. He has not been taking the lorazepam and the Valium together.   He was seen by [facility] visit and arrangements will be made for him to see Suella Broad, nonoperative back specialist.   Review of Systems  He has had chronic numbness and tingling in the left lower extremity since his surgery in 2009. He denies fever, chills, sweats, weight loss  He has no incontinence of urine or stool.      Objective:   Physical Exam  Pertinent positive findings include: Heart sounds are distant. Clinically he is not in atrial fibrillation today.  Breath sounds are also decreased. He does have scattered minimal rhonchi.  He limps on the left leg with when he walks.  He has pain in the lumbosacral area bilaterally with straight leg raising to 15-30 degrees.  He has pain sitting up from the exam table.  Pedal pulses are decreased except for the right dorsalis pedis pulse.   General appearance :adequately nourished; in no distress. Eyes: No conjunctival inflammation or scleral icterus is present. Heart:  Normal rate and regular rhythm. S1 and S2 normal without gallop, murmur, click, rub or other extra sounds  Present. No increased work of breathi Abdomen: bowel sounds normal, soft and non-tender without  masses, organomegaly or hernias noted.  No guarding or rebound. No flank tenderness to percussion. Vascular : no bruits present. Skin:Warm & dry.  Intact without suspicious lesions or rashes ; no tenting or jaundice  Lymphatic: No lymphadenopathy is noted about the head, neck, axilla Neuro: Strength, tone  normal.        Assessment & Plan:  See Current Assessment & Plan in Problem List under specific Diagnosis

## 2014-05-30 NOTE — Progress Notes (Signed)
Pre visit review using our clinic review tool, if applicable. No additional management support is needed unless otherwise documented below in the visit note. 

## 2014-05-31 NOTE — Telephone Encounter (Signed)
Pt's wife is returning the nurse's call in reference to his surgery

## 2014-05-31 NOTE — Telephone Encounter (Signed)
Spoke with pt wife, the patient is having extensive hand surgery, they described it as similar to a knee replacement. The procedure has not been scheduled yet. They need clearance for the procedure and the okay to hold the warfarin prior to the procedure. Will forward for dr hochrein's review

## 2014-05-31 NOTE — Assessment & Plan Note (Signed)
Focus on BP control Annual monitor

## 2014-05-31 NOTE — Assessment & Plan Note (Signed)
Findings discussed

## 2014-06-06 NOTE — Telephone Encounter (Signed)
Spoke to wife . Informed wife ,awaitng for Dr Percival Spanish to comment if cardiac clearance is given and warfarin usage or does the patient need an appointment. Will contact wife with decision

## 2014-06-06 NOTE — Telephone Encounter (Signed)
Pt's wife called in wanting to f/u about the pt needing clearance for his hand surgery . Please call back  Thanks

## 2014-06-08 NOTE — Telephone Encounter (Signed)
Patient is returning Dr. Rosezella Florida call from Wednesday.  Please call patient's cell---[phone].

## 2014-06-08 NOTE — Telephone Encounter (Signed)
I spoke with the patient.  He has no high risk findings.  No further cardiac evaluation is indicated.  He can hold his warfarin for 5 days prior to his surgery for his hand.  No bridging is needed.  Based on ACC/AHA guidelines, the patient would be at acceptable risk for the planned procedure without further cardiovascular testing.

## 2014-06-08 NOTE — Telephone Encounter (Signed)
Patient is returning a call from this afternoon

## 2014-06-10 ENCOUNTER — Other Ambulatory Visit: Payer: Self-pay | Admitting: Internal Medicine

## 2014-06-11 NOTE — Telephone Encounter (Signed)
OK X 3 mo

## 2014-06-13 DIAGNOSIS — M961 Postlaminectomy syndrome, not elsewhere classified: Secondary | ICD-10-CM | POA: Diagnosis not present

## 2014-06-13 DIAGNOSIS — G894 Chronic pain syndrome: Secondary | ICD-10-CM | POA: Diagnosis not present

## 2014-06-26 ENCOUNTER — Telehealth: Payer: Self-pay | Admitting: Cardiology

## 2014-06-26 NOTE — Telephone Encounter (Signed)
Spoke to [employer], She states the patient will have a Lumbar spinal nerve root block. Needs instruction on stopping /holding Plavix and coumadin for 5 days before injectios Deanna aware will defer to Dr Percival Spanish. Will defer to pharmacist Erasmo Downer

## 2014-06-26 NOTE — Telephone Encounter (Signed)
Pt need to stop Plavix and Coumadin 5 days before injection please. Please fax thi to [phone].

## 2014-06-29 ENCOUNTER — Encounter: Payer: Self-pay | Admitting: *Deleted

## 2014-06-29 NOTE — Telephone Encounter (Signed)
OK to hold anticoagulation 5 days prior to the procedure.

## 2014-06-29 NOTE — Telephone Encounter (Signed)
Called [name] [name], advised med discontinuation OK PTP per Dr. Rosezella Florida instructions. Understanding verbalized, she requested faxed letter and provided fax number. Written instructions put to letter, faxed to number provided by requestor.

## 2014-07-02 ENCOUNTER — Other Ambulatory Visit: Payer: Self-pay | Admitting: Internal Medicine

## 2014-07-02 DIAGNOSIS — M79642 Pain in left hand: Secondary | ICD-10-CM | POA: Diagnosis not present

## 2014-07-02 NOTE — Telephone Encounter (Signed)
Lorazepam has been called to [facility]

## 2014-07-02 NOTE — Telephone Encounter (Signed)
OK X1 

## 2014-07-06 DIAGNOSIS — M961 Postlaminectomy syndrome, not elsewhere classified: Secondary | ICD-10-CM | POA: Diagnosis not present

## 2014-07-06 DIAGNOSIS — Z981 Arthrodesis status: Secondary | ICD-10-CM | POA: Diagnosis not present

## 2014-07-06 DIAGNOSIS — M5137 Other intervertebral disc degeneration, lumbosacral region: Secondary | ICD-10-CM | POA: Diagnosis not present

## 2014-07-09 ENCOUNTER — Ambulatory Visit: Payer: Medicare Other | Admitting: Pharmacist Clinician (PhC)/ Clinical Pharmacy Specialist

## 2014-07-11 ENCOUNTER — Encounter: Payer: Self-pay | Admitting: Gastroenterology

## 2014-07-24 DIAGNOSIS — R001 Bradycardia, unspecified: Secondary | ICD-10-CM | POA: Diagnosis not present

## 2014-07-24 DIAGNOSIS — E782 Mixed hyperlipidemia: Secondary | ICD-10-CM | POA: Diagnosis not present

## 2014-07-24 DIAGNOSIS — I10 Essential (primary) hypertension: Secondary | ICD-10-CM | POA: Diagnosis not present

## 2014-07-24 DIAGNOSIS — I480 Paroxysmal atrial fibrillation: Secondary | ICD-10-CM | POA: Diagnosis not present

## 2014-07-24 DIAGNOSIS — Z955 Presence of coronary angioplasty implant and graft: Secondary | ICD-10-CM | POA: Diagnosis not present

## 2014-07-24 DIAGNOSIS — I2510 Atherosclerotic heart disease of native coronary artery without angina pectoris: Secondary | ICD-10-CM | POA: Diagnosis not present

## 2014-07-24 DIAGNOSIS — E6609 Other obesity due to excess calories: Secondary | ICD-10-CM | POA: Diagnosis not present

## 2014-07-25 DIAGNOSIS — I10 Essential (primary) hypertension: Secondary | ICD-10-CM | POA: Diagnosis not present

## 2014-07-25 DIAGNOSIS — E785 Hyperlipidemia, unspecified: Secondary | ICD-10-CM | POA: Diagnosis not present

## 2014-07-25 DIAGNOSIS — Z9861 Coronary angioplasty status: Secondary | ICD-10-CM | POA: Diagnosis not present

## 2014-07-25 DIAGNOSIS — I2510 Atherosclerotic heart disease of native coronary artery without angina pectoris: Secondary | ICD-10-CM | POA: Diagnosis not present

## 2014-07-25 DIAGNOSIS — I4891 Unspecified atrial fibrillation: Secondary | ICD-10-CM | POA: Diagnosis not present

## 2014-07-26 DIAGNOSIS — J449 Chronic obstructive pulmonary disease, unspecified: Secondary | ICD-10-CM | POA: Diagnosis not present

## 2014-07-26 DIAGNOSIS — J069 Acute upper respiratory infection, unspecified: Secondary | ICD-10-CM | POA: Diagnosis not present

## 2014-07-27 ENCOUNTER — Ambulatory Visit: Payer: Medicare Other | Admitting: Pharmacist Clinician (PhC)/ Clinical Pharmacy Specialist

## 2014-07-30 DIAGNOSIS — M25532 Pain in left wrist: Secondary | ICD-10-CM | POA: Diagnosis not present

## 2014-07-30 DIAGNOSIS — M19032 Primary osteoarthritis, left wrist: Secondary | ICD-10-CM | POA: Diagnosis not present

## 2014-08-01 DIAGNOSIS — M19132 Post-traumatic osteoarthritis, left wrist: Secondary | ICD-10-CM | POA: Diagnosis not present

## 2014-08-08 ENCOUNTER — Ambulatory Visit: Payer: Medicare Other | Admitting: Pharmacist Clinician (PhC)/ Clinical Pharmacy Specialist

## 2014-08-09 ENCOUNTER — Ambulatory Visit (INDEPENDENT_AMBULATORY_CARE_PROVIDER_SITE_OTHER): Payer: Medicare Other | Admitting: Internal Medicine

## 2014-08-09 ENCOUNTER — Encounter: Payer: Self-pay | Admitting: Internal Medicine

## 2014-08-09 ENCOUNTER — Ambulatory Visit (INDEPENDENT_AMBULATORY_CARE_PROVIDER_SITE_OTHER): Payer: Medicare Other | Admitting: Pharmacist Clinician (PhC)/ Clinical Pharmacy Specialist

## 2014-08-09 VITALS — BP 124/72 | HR 74 | Temp 97.5°F | Resp 17 | Wt 261.0 lb

## 2014-08-09 DIAGNOSIS — J209 Acute bronchitis, unspecified: Secondary | ICD-10-CM

## 2014-08-09 DIAGNOSIS — Z7901 Long term (current) use of anticoagulants: Secondary | ICD-10-CM | POA: Diagnosis not present

## 2014-08-09 DIAGNOSIS — I4891 Unspecified atrial fibrillation: Secondary | ICD-10-CM

## 2014-08-09 DIAGNOSIS — J449 Chronic obstructive pulmonary disease, unspecified: Secondary | ICD-10-CM | POA: Diagnosis not present

## 2014-08-09 DIAGNOSIS — Z86718 Personal history of other venous thrombosis and embolism: Secondary | ICD-10-CM

## 2014-08-09 DIAGNOSIS — I480 Paroxysmal atrial fibrillation: Secondary | ICD-10-CM | POA: Diagnosis not present

## 2014-08-09 DIAGNOSIS — IMO0001 Reserved for inherently not codable concepts without codable children: Secondary | ICD-10-CM

## 2014-08-09 LAB — POCT INR: INR: 4

## 2014-08-09 MED ORDER — BUDESONIDE-FORMOTEROL FUMARATE 160-4.5 MCG/ACT IN AERO: 2.0000 | INHALATION_SPRAY | Freq: Two times a day (BID) | RESPIRATORY_TRACT | Status: DC

## 2014-08-09 MED ORDER — HYDROCODONE-HOMATROPINE 5-1.5 MG/5ML PO SYRP: 5.0000 mL | ORAL_SOLUTION | Freq: Four times a day (QID) | ORAL | Status: DC | PRN

## 2014-08-09 MED ORDER — PREDNISONE 20 MG PO TABS: 20.0000 mg | ORAL_TABLET | Freq: Two times a day (BID) | ORAL | Status: DC

## 2014-08-09 MED ORDER — DOXYCYCLINE HYCLATE 100 MG PO TABS: 100.0000 mg | ORAL_TABLET | Freq: Two times a day (BID) | ORAL | Status: DC

## 2014-08-09 NOTE — Progress Notes (Signed)
   Subjective:    Patient ID: [name] [name], male    DOB: 12-20-1945, 69 y.o.   MRN: [id number]  HPI  He's had a cough for 2 weeks with temperatures up to 102.7. He saw a physician in [place] who diagnosed a viral illness. Since that time the symptoms have progressed. He has nasal congestion, nasal obstruction, yellow-brown nasal purulence, and frontal and maxillary headache. He also has symptoms of fatigue. The cough has been productive of clear to yellow/brown secretions. The volume of secretions is greater from the head than from the chest. The cough has been waking him up night.  His PT/INR  was four today. He was to hold  warfarin for 48 hours.  Review of Systems He has no otic pain or otic discharge. There was no extrinsic component of itchy, watery eyes.    Objective:   Physical Exam Pertinent or positive findings include: Heart sounds are slightly distant.  He has diffuse slightly asymmetric musical wheezing throughout all lung fields.  General appearance:Adequately nourished; no acute distress or increased work of breathing is present.   Lymphatic: No  lymphadenopathy about the head, neck, or axilla .Eyes: No conjunctival inflammation or lid edema is present. There is no scleral icterus. Ears:  External ear exam shows no significant lesions or deformities.  Otoscopic examination reveals clear canals, tympanic membranes are intact bilaterally without bulging, retraction, inflammation or discharge. Nose:  External nasal examination shows no deformity or inflammation. Nasal mucosa are dry without lesions or exudates No septal dislocation or deviation.No obstruction to airflow.  Oral exam: Dental hygiene is good; lips and gums are healthy appearing.There is no oropharyngeal erythema or exudate . Neck:  No deformities, thyromegaly, masses, or tenderness noted.   Heart:  Normal rate and regular rhythm. S1 and S2 normal without gallop, murmur, click, rub or other extra sounds.  Extremities:   No cyanosis, edema, or clubbing  noted  Skin: Warm & dry w/o tenting or jaundice. No significant lesions or rash.       Assessment & Plan:  #1 acute bronchitis with bronchospasm, recurrent  Plan: See orders and recommendations. Pulmonary consultation will be pursued as this represents a chronic recurrent issue. Problematic is his warfarin therapy and history of anaphylaxis with penicillin.

## 2014-08-09 NOTE — Patient Instructions (Signed)
   Please check your PT/INR on Tuesday 6/7

## 2014-08-09 NOTE — Progress Notes (Signed)
Pre visit review using our clinic review tool, if applicable. No additional management support is needed unless otherwise documented below in the visit note. 

## 2014-08-10 ENCOUNTER — Telehealth: Payer: Self-pay | Admitting: Internal Medicine

## 2014-08-10 MED ORDER — FLUTICASONE-SALMETEROL 250-50 MCG/DOSE IN AEPB
1.0000 | INHALATION_SPRAY | Freq: Two times a day (BID) | RESPIRATORY_TRACT | Status: DC
Start: 2014-08-10 — End: 2014-08-21

## 2014-08-10 NOTE — Telephone Encounter (Signed)
#  1 with 5 refills

## 2014-08-10 NOTE — Telephone Encounter (Signed)
Please advise on dosage of advair, thanks

## 2014-08-10 NOTE — Telephone Encounter (Signed)
Change made, pt advise of same

## 2014-08-10 NOTE — Telephone Encounter (Signed)
250/50 

## 2014-08-10 NOTE — Telephone Encounter (Signed)
Requesting script for advair to be sent in place of symbicort (if pcp thinks advair is ok) to be sent to [facility].  Symbicort is to expensive.  Would like something sent today if possible.

## 2014-08-13 ENCOUNTER — Encounter: Payer: Self-pay | Admitting: Gastroenterology

## 2014-08-13 ENCOUNTER — Telehealth: Payer: Self-pay | Admitting: Internal Medicine

## 2014-08-13 NOTE — Telephone Encounter (Signed)
Patient was just in 6/2 with bronchitis. He is scheduled for hand surgery on 6/16 with Dr. Amedeo Plenty. He is wondering if he will be fine for surgery. He is taking prednisone

## 2014-08-13 NOTE — Telephone Encounter (Signed)
Major concern is warfarin therapy ; he needs to be cleared by Cardiologist Rxing that

## 2014-08-13 NOTE — Telephone Encounter (Signed)
Please advise, thanks.

## 2014-08-14 ENCOUNTER — Other Ambulatory Visit (INDEPENDENT_AMBULATORY_CARE_PROVIDER_SITE_OTHER): Payer: Medicare Other

## 2014-08-14 ENCOUNTER — Telehealth: Payer: Self-pay

## 2014-08-14 DIAGNOSIS — Z86718 Personal history of other venous thrombosis and embolism: Secondary | ICD-10-CM | POA: Diagnosis not present

## 2014-08-14 DIAGNOSIS — Z7901 Long term (current) use of anticoagulants: Secondary | ICD-10-CM | POA: Diagnosis not present

## 2014-08-14 DIAGNOSIS — I4891 Unspecified atrial fibrillation: Secondary | ICD-10-CM | POA: Diagnosis not present

## 2014-08-14 LAB — PROTIME-INR
INR: 2 ratio — AB (ref 0.8–1.0)
PROTHROMBIN TIME: 22.1 s — AB (ref 9.6–13.1)

## 2014-08-14 NOTE — Telephone Encounter (Signed)
Wife called back in.  She was returning the call

## 2014-08-14 NOTE — Telephone Encounter (Signed)
LVM for pt to call back as soon as possible. \  RE: MD response below.  

## 2014-08-16 NOTE — Telephone Encounter (Signed)
i called patient, talked with patients wife---advised her that dr hopper is out of office until Wednesday---since sx is very near, patient should check with surgeon and cardiologist before proceeding---patients wife also was concerned about his recent dx of bronchitis, i advised her to also check with surgeon to explain recent illness and have surgeon advise patient whether he should reschedule sx at this time

## 2014-08-16 NOTE — Telephone Encounter (Signed)
Pt wife called back in, can you call her as soon has you get a chance.  He is having surgery.     Best number [phone]

## 2014-08-17 ENCOUNTER — Other Ambulatory Visit: Payer: Self-pay | Admitting: Cardiology

## 2014-08-20 ENCOUNTER — Encounter: Payer: Self-pay | Admitting: Internal Medicine

## 2014-08-20 ENCOUNTER — Ambulatory Visit (INDEPENDENT_AMBULATORY_CARE_PROVIDER_SITE_OTHER): Payer: Medicare Other | Admitting: Internal Medicine

## 2014-08-20 VITALS — BP 140/90 | HR 63 | Temp 97.8°F | Resp 18 | Ht 74.0 in | Wt 265.0 lb

## 2014-08-20 DIAGNOSIS — I10 Essential (primary) hypertension: Secondary | ICD-10-CM | POA: Diagnosis not present

## 2014-08-20 DIAGNOSIS — D649 Anemia, unspecified: Secondary | ICD-10-CM | POA: Diagnosis not present

## 2014-08-20 DIAGNOSIS — E785 Hyperlipidemia, unspecified: Secondary | ICD-10-CM | POA: Diagnosis not present

## 2014-08-20 DIAGNOSIS — D126 Benign neoplasm of colon, unspecified: Secondary | ICD-10-CM

## 2014-08-20 DIAGNOSIS — R739 Hyperglycemia, unspecified: Secondary | ICD-10-CM | POA: Insufficient documentation

## 2014-08-20 NOTE — Assessment & Plan Note (Signed)
Lipids, LFTs, TSH  

## 2014-08-20 NOTE — Patient Instructions (Addendum)
  Your next office appointment will be determined based upon review of your pending labs.  Those written interpretation of the lab results and instructions will be transmitted to you by mail for your records.  Critical results will be called.   Followup as needed for any active or acute issue. Please report any significant change in your symptoms. Her husband you doesn't meet [employer] he does meet

## 2014-08-20 NOTE — Assessment & Plan Note (Signed)
CBC IBC B12

## 2014-08-20 NOTE — Assessment & Plan Note (Signed)
A1c

## 2014-08-20 NOTE — Progress Notes (Signed)
Pre visit review using our clinic review tool, if applicable. No additional management support is needed unless otherwise documented below in the visit note. 

## 2014-08-20 NOTE — Assessment & Plan Note (Signed)
Blood pressure goals reviewed. BMET 

## 2014-08-21 ENCOUNTER — Other Ambulatory Visit (INDEPENDENT_AMBULATORY_CARE_PROVIDER_SITE_OTHER): Payer: Medicare Other

## 2014-08-21 ENCOUNTER — Ambulatory Visit (INDEPENDENT_AMBULATORY_CARE_PROVIDER_SITE_OTHER): Payer: Medicare Other | Admitting: Pulmonary Disease

## 2014-08-21 ENCOUNTER — Encounter: Payer: Self-pay | Admitting: Pulmonary Disease

## 2014-08-21 ENCOUNTER — Encounter: Payer: Self-pay | Admitting: Internal Medicine

## 2014-08-21 VITALS — BP 118/70 | HR 59 | Temp 97.0°F | Ht 74.0 in | Wt 264.0 lb

## 2014-08-21 DIAGNOSIS — J441 Chronic obstructive pulmonary disease with (acute) exacerbation: Secondary | ICD-10-CM

## 2014-08-21 DIAGNOSIS — E785 Hyperlipidemia, unspecified: Secondary | ICD-10-CM

## 2014-08-21 DIAGNOSIS — J449 Chronic obstructive pulmonary disease, unspecified: Secondary | ICD-10-CM

## 2014-08-21 DIAGNOSIS — I10 Essential (primary) hypertension: Secondary | ICD-10-CM | POA: Diagnosis not present

## 2014-08-21 DIAGNOSIS — D649 Anemia, unspecified: Secondary | ICD-10-CM

## 2014-08-21 DIAGNOSIS — R0600 Dyspnea, unspecified: Secondary | ICD-10-CM

## 2014-08-21 DIAGNOSIS — I480 Paroxysmal atrial fibrillation: Secondary | ICD-10-CM | POA: Diagnosis not present

## 2014-08-21 DIAGNOSIS — R739 Hyperglycemia, unspecified: Secondary | ICD-10-CM

## 2014-08-21 LAB — HEPATIC FUNCTION PANEL
ALBUMIN: 4 g/dL (ref 3.5–5.2)
ALT: 18 U/L (ref 0–53)
AST: 18 U/L (ref 0–37)
Alkaline Phosphatase: 69 U/L (ref 39–117)
BILIRUBIN TOTAL: 0.8 mg/dL (ref 0.2–1.2)
Bilirubin, Direct: 0.2 mg/dL (ref 0.0–0.3)
TOTAL PROTEIN: 6.9 g/dL (ref 6.0–8.3)

## 2014-08-21 LAB — HEMOGLOBIN A1C: HEMOGLOBIN A1C: 6.2 % (ref 4.6–6.5)

## 2014-08-21 LAB — CBC WITH DIFFERENTIAL/PLATELET
BASOS ABS: 0.1 10*3/uL (ref 0.0–0.1)
BASOS PCT: 0.5 % (ref 0.0–3.0)
EOS ABS: 0.2 10*3/uL (ref 0.0–0.7)
Eosinophils Relative: 2 % (ref 0.0–5.0)
HCT: 37.1 % — ABNORMAL LOW (ref 39.0–52.0)
HEMOGLOBIN: 12.3 g/dL — AB (ref 13.0–17.0)
Lymphocytes Relative: 24.6 % (ref 12.0–46.0)
Lymphs Abs: 2.7 10*3/uL (ref 0.7–4.0)
MCHC: 33.3 g/dL (ref 30.0–36.0)
MCV: 102.6 fl — ABNORMAL HIGH (ref 78.0–100.0)
Monocytes Absolute: 1.1 10*3/uL — ABNORMAL HIGH (ref 0.1–1.0)
Monocytes Relative: 10.3 % (ref 3.0–12.0)
NEUTROS PCT: 62.6 % (ref 43.0–77.0)
Neutro Abs: 6.9 10*3/uL (ref 1.4–7.7)
PLATELETS: 375 10*3/uL (ref 150.0–400.0)
RBC: 3.61 Mil/uL — AB (ref 4.22–5.81)
RDW: 16.6 % — ABNORMAL HIGH (ref 11.5–15.5)
WBC: 11.1 10*3/uL — ABNORMAL HIGH (ref 4.0–10.5)

## 2014-08-21 LAB — BASIC METABOLIC PANEL
BUN: 14 mg/dL (ref 6–23)
CO2: 27 mEq/L (ref 19–32)
Calcium: 9.1 mg/dL (ref 8.4–10.5)
Chloride: 103 mEq/L (ref 96–112)
Creatinine, Ser: 0.87 mg/dL (ref 0.40–1.50)
GFR: 92.09 mL/min (ref >60.00)
Glucose, Bld: 103 mg/dL — ABNORMAL HIGH (ref 70–99)
POTASSIUM: 4.2 meq/L (ref 3.5–5.1)
Sodium: 136 mEq/L (ref 135–145)

## 2014-08-21 LAB — TSH: TSH: 1.73 u[IU]/mL (ref 0.35–4.50)

## 2014-08-21 LAB — VITAMIN B12: VITAMIN B 12: 441 pg/mL (ref 211–911)

## 2014-08-21 LAB — IBC PANEL
Iron: 149 ug/dL (ref 42–165)
SATURATION RATIOS: 45.3 % (ref 20.0–50.0)
Transferrin: 235 mg/dL (ref 212.0–360.0)

## 2014-08-21 NOTE — Progress Notes (Addendum)
Subjective:     Patient ID: [name] [name], male   DOB: 1945-06-18, 69 y.o.   MRN: [id number]  HPI 69 y/o WM, referred by DrHopper for a pulmonary evaluation due to complaints of dyspnea>  [name] [name] is an ex-smoker w/ a hx COPD, prev pneumonia, & freq URIs 2-3 times yearly that are hard to shake-off;  He has experienced progressive DOE since his retirement & now notes DOE walking to [employer] & back;  He started smoking in his teens, smoked up to 2ppd for 25 yrs then switched to a pipe & quit all smoking in 2002 he says (50-60 pack-year smoking);  He notes the freq bronchitic exacerbations and requires antibiotics and Pred to resolve;  He had pneumonia about 5 yrs ago, hosp x 5d in [place] w/ extensive testing he says;  Current symotms include the SOB, cough, sm amt beige sput, no hemoptysis, no CP etc;  He was treated several weeks ago in [place] for a COPD exac w/ Doxy & Pred;  He is a retired Regulatory affairs officer & denies occup exposures;  There is a FamHx of lung dis in his father (COPD, smoker), mother (COPD, smoker)... Current Meds> Symbicort160-2spBid, Hycodan prn.  NOTE> he saw DrClance in 2013 & 2015 w/ similar severe DOE- on Symbicort160 & Spiriva added but he didn't continue and was a no show for Full PFTs & didn't fllow up...      He is followed by DrHochrein for CARDS> HBP, CAD, PAFib, modMR & septal hypertrophy, hx of DVT & PE (aqfter back surg in 2010);  Last seen 02/20/14 & his note is reviewed... Holding NSR on Sotalol80Bid, Plavix75, Coumadin; prev Cardizem & BBlocker was stopped in 2015 for orthostsis; they noted poor exercise tolerance on stress test in 2015; Cath showed high grade LAD stenosis- s/p angioplasty & stent w/ improved breathing after that but still deconditioned, therefore did cardiac rehab w/ some benefit but he didn't continue home exercise after that... 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       EXAM revealed Afeb, VSS, O2sat=96% on RA; Wt=264#, 6'2"Tall, BMI=31-2;  HEENT- neg, mallampati2;  Chest- few scat rhonchi & end-exp wheezing;  Heart- RR Gr1/6SEM w/o r/g;  Abd- neg; Ext- w/o c/c/e...  Full PFTs 07/25/13 showed FVC=4.08 (79%), FEV1=2.78 (73%), %1sec=68, mid-flows reduced at 65% predicted; FEV1 improved 5% after bronchodil; TLC=6.38 (81%);  DLCO=46%  Spirometry 08/21/14 showed FVC=3.82 (75%), FEV1=2.65 (68%), %1sec=69, mid-flows reduced at 48% predicted; c/w mild airflow obstruction and GOLD Stage 2 COPD/Emphysema...  Ambulatory oxygen saturation test 08/21/14> O2sat=96% on RA at rest w/ pulse=65/min; he ambulated 3 laps in the office w/ lowest O2sat=94% w/ pulse=75/min...   LABS 08/2014:  Chems- ok w/ BS=103, A1c=6.2;  CBC- ok w/ Hg=12.3, Fe- wnl,  B12=441; TSH=1.73;  Alpha-1-Antitrypsin==> level= 136 (83-199), and Phenotype= MM  CT Chest => norm heart size, aortic atherosclerosis & calcif in the coronaries, COPD/ paraseptal emphysema is confirmed, some scarring on right, DDD & T8 compression w/o change... IMP/PLAN>>  [name] [name] has multifactorial dyspnea w/ COPD/Emphysema, CAD, deconditioning all playing a roll;  COPD=> mixed CB/E w/ freq exac;  Hx DVT/ PE on coumadin (occurred 2010 after back surg);  We discussed checking a CT Chest and A1AT level, continue RX w/ SYMBICORT160-2spBid, add [id number] w/ fluids, and push a gradual exercise program..[name]  He will follow up w/e in 6weeks...    Past Medical History  Diagnosis Date  . BPH (benign prostatic hypertrophy)   . Spinal stenosis     Congential  . Hypertension   . Asthma   . DVT (deep venous thrombosis)     a. 2010 Lower ext s/p back surgery.  . Pulmonary embolism     a. 2010 in setting of DVT post-op back surgery. b. Low prob VQ 07/2013.  [name] PAF (paroxysmal atrial fibrillation)     a. Flecainide discontinued 07/2013 in setting of CAD.  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 COPD (chronic obstructive pulmonary disease)     a. 07/2013 PFT's mild airflow obstruction, no restriction, sev decrease in DLCO.  [name] GERD (gastroesophageal reflux disease)   . Diverticulosis   . Hx of echocardiogram 2015    Echo (06/2013): EF 60-65% normal wall motion, normal diastolic function, aortic sclerosis without stenosis, Trivial MR, mild SAM due to long, redundant mitral leaflets, mild RAE, normal RVSF  . Dyspnea on exertion     a. 07/2013 PFT's mild airflow obstr   . CAD (coronary artery disease)     a. 07/2013: s/p DES to LAD, normal LVF.  . Macrocytic anemia     a. 07/2013: documented on prior labs.  . Obesity   . Transaminitis     a. 07/2013: mild.  . Complication of anesthesia     "I got all kinds of hallucinations"  . Pneumonia ~ 2010 X 1  . History of hiatal hernia   . Arthritis   . History of gout     Past Surgical History  Procedure Laterality Date  . Vasectomy    . Lumbar fusion  02/2008  . Total hip arthroplasty Bilateral   . Cataract extraction w/ intraocular lens  implant, bilateral Bilateral   . Colonoscopy w/ polypectomy  2004  . Joint replacement    . Anterior lumbar disc arthroplasty  03/2008    "spacer poped out; had to repair"  . Coronary angioplasty with stent placement  08/08/2013    "1"  . Left and right heart catheterization with coronary angiogram N/A 08/07/2013    Procedure: LEFT AND RIGHT HEART CATHETERIZATION WITH CORONARY ANGIOGRAM;  Surgeon: Peter M Martinique, MD;  Location: [facility] CATH LAB;  Service: Cardiovascular;  Laterality: N/A;  . Percutaneous coronary stent intervention (pci-s)  08/07/2013    Procedure: PERCUTANEOUS CORONARY STENT INTERVENTION (PCI-S);  Surgeon: Peter M Martinique, MD;  Location: [facility] CATH LAB;  Service: Cardiovascular;;  . Colonoscopy with polypectomy  09/2011    2 tubular adenomas    Outpatient Encounter Prescriptions as of 08/21/2014  Medication Sig  . atorvastatin (LIPITOR) 10 MG tablet Take 1  tablet (10 mg total) by mouth every evening. (Patient taking differently: Take 10 mg by mouth daily. )  . budesonide-formoterol (SYMBICORT) 160-4.5 MCG/ACT inhaler Inhale 2 puffs into the lungs 2 (two) times daily.  . clopidogrel (PLAVIX) 75 MG tablet TAKE 1 TABLET DAILY WITH BREAKFAST  . HYDROcodone-homatropine (HYDROMET) 5-1.5 MG/5ML syrup Take 5 mLs by mouth every 6 (six) hours as needed for cough.  [name] LORazepam (ATIVAN) 1 MG tablet TAKE 1/2-1 TABLET BY MOUTH DAILY AS NEEDED ONLY  . nitroGLYCERIN (NITROSTAT) 0.4 MG SL tablet Place 1 tablet (0.4 mg total) under the tongue every 5 (five) minutes as needed for chest pain (up to 3 doses).  [name] oxyCODONE-acetaminophen (PERCOCET/ROXICET) 5-325 MG per tablet Take 1-2 tablets by mouth every 6 (six) hours as needed for  severe pain.  . pantoprazole (PROTONIX) 40 MG tablet Take 1 tablet (40 mg total) by mouth at bedtime.  . sertraline (ZOLOFT) 50 MG tablet TAKE 1 TABLET DAILY  . sotalol (BETAPACE) 80 MG tablet Take 1 tablet (80 mg total) by mouth 2 (two) times daily.  [name] warfarin (COUMADIN) 3 MG tablet Take as directed by coumadin clinic (Patient taking differently: Take 3-6 mg by mouth daily. Take 1.5 tablets on (Mon, Tues, Wed, Fri, Sat) & Take 2 tablets on (Sun & Thurs))  . [DISCONTINUED] Fluticasone-Salmeterol (ADVAIR DISKUS) 250-50 MCG/DOSE AEPB Inhale 1 puff into the lungs 2 (two) times daily.  . [DISCONTINUED] doxycycline (VIBRA-TABS) 100 MG tablet Take 1 tablet (100 mg total) by mouth 2 (two) times daily. (Patient not taking: Reported on 08/21/2014)   No facility-administered encounter medications on file as of 08/21/2014.    Allergies  Allergen Reactions  . Penicillins Anaphylaxis     Because of a history of documented adverse serious drug reaction;Medi Alert bracelet  is recommended    Immunization History  Administered Date(s) Administered  . Influenza Split 12/24/2010, 02/09/2012  . Influenza Whole 12/24/2009  . Influenza, High Dose Seasonal  PF 01/17/2013  . Influenza,inj,Quad PF,36+ Mos 01/04/2014  . Pneumococcal Polysaccharide-23 04/08/2009  . Td 04/25/2007    Family History  Problem Relation Age of Onset  . Atrial fibrillation Mother   . COPD Mother   . Heart disease Father   . Colon cancer Father   . Heart attack Brother 38  . Alcohol abuse Brother 10    Died post liver transplant  . Stroke Neg Hx   . Heart attack Paternal Grandfather     Over 65  . Heart attack Paternal Uncle     Less than 56  Father died at 86 w/ heart dis, COPD/emphysema, smoker, colon cancer... Mother died age 58 w/ COPD/ emphysema/ smoker on oxygen 2 Sibs- 1Bro w/ Cirrhosis & liver Tx; 1Bro died at 86 w/ MI   History   Social History  . Marital Status: Married    Spouse Name: N/A  . Number of Children: 4  . Years of Education: N/A   Occupational History  . Retired from [employer]  . Smoking status: Former Smoker -- 2.00 packs/day for 40 years    Types: Cigarettes    Quit date: 03/09/2000  . Smokeless tobacco: Never Used     Comment: smoked 1964-2002, up to 3 ppd  . Alcohol Use: 0.0 oz/week    0 Standard drinks or equivalent per week     Comment: 02/08/2014 "drink a couple times/month"  . Drug Use: No  . Sexual Activity: Not on file     Comment: "sexually hx is none of your business" (02/08/2014)   Other Topics Concern  . Not on file   Social History Narrative    Current Medications, Allergies, Past Medical History, Past Surgical History, Family History, and Social History were reviewed in [employer] record.   Review of Systems            All symptoms NEG except where BOLDED >>  Constitutional:  F/C/S, fatigue, anorexia, unexpected weight change. HEENT:  HA, visual changes, hearing loss, earache, nasal symptoms, sore throat, mouth sores, hoarseness. Resp:  cough, sputum, hemoptysis; SOB, tightness, wheezing. Cardio:  CP, palpit, DOE, orthopnea, edema. GI:  N/V/D/C, blood  in stool; reflux, abd pain, distention, gas. GU:  dysuria, freq, urgency, hematuria, flank pain, voiding difficulty. MS:  joint pain, swelling, tenderness, decr ROM; neck pain, back pain, etc. Neuro:  HA, tremors, seizures, dizziness, syncope, weakness, numbness, gait abn. Skin:  suspicious lesions or skin rash. Heme:  adenopathy, bruising, bleeding. Psyche:  confusion, agitation, sleep disturbance, hallucinations, anxiety, depression suicidal.   Objective:   Physical Exam      Vital Signs:  Reviewed...  General:  WD, WN,    y/o WM in NAD; alert & oriented; pleasant & cooperative... HEENT:  [place]/AT; Conjunctiva- pink, Sclera- nonicteric, EOM-wnl, PERRLA, Fundi-benign; EACs-clear, TMs-wnl; NOSE-clear; THROAT-clear & wnl. Neck:  Supple w/ full ROM; no JVD; normal carotid impulses w/o bruits; no thyromegaly or nodules palpated; no lymphadenopathy. Chest:  Clear to P & A; without wheezes, rales, or rhonchi heard. Heart:  Regular Rhythm; norm S1 & S2 without murmurs, rubs, or gallops detected. Abdomen:  Soft & nontender- no guarding or rebound; normal bowel sounds; no organomegaly or masses palpated. Ext:  Normal ROM; without deformities or arthritic changes; no varicose veins, venous insuffic, or edema;  Pulses intact w/o bruits. Neuro:  CNs II-XII intact; motor testing normal; sensory testing normal; gait normal & balance OK. Derm:  No lesions noted; no rash etc. Lymph:  No cervical, supraclavicular, axillary, or inguinal adenopathy palpated.   Assessment:      Dyspnea-- multifactorial w/ COPD/Emphysema, CAD, deconditioning all playing a roll... COPD-- mixed CB/E w/ freq exac... Hx DVT/ PE on coumadin-- occurred 2010 after back surg  CAD-- s/p PTCA, stent & followed by DrHochrein PAF-- on Sotalol  ASPVD Mult medical issues...   IMP/PLAN>>  [name] [name] has multifactorial dyspnea w/ COPD/Emphysema, CAD, deconditioning all playing a roll;  COPD=> mixed CB/E w/ freq exac;  Hx DVT/ PE on coumadin  (occurred 2010 after back surg);  We discussed checking a CT Chest and A1AT level, continue RX w/ SYMBICORT160-2spBid, add [id number] w/ fluids, and push a gradual exercise program... He will follow up w/e in 6weeks.     Plan:     Patient's Medications  New Prescriptions                                                        MUCINEX 600mg  >> 1 tab Qid w/ fluids...  Previous Medications   ATORVASTATIN (LIPITOR) 10 MG TABLET    Take 1 tablet (10 mg total) by mouth every evening.   BUDESONIDE-FORMOTEROL (SYMBICORT) 160-4.5 MCG/ACT INHALER    Inhale 2 puffs into the lungs 2 (two) times daily.   CLOPIDOGREL (PLAVIX) 75 MG TABLET    TAKE 1 TABLET DAILY WITH BREAKFAST   HYDROCODONE-HOMATROPINE (HYDROMET) 5-1.5 MG/5ML SYRUP    Take 5 mLs by mouth every 6 (six) hours as needed for cough.   LORAZEPAM (ATIVAN) 1 MG TABLET    TAKE 1/2-1 TABLET BY MOUTH DAILY AS NEEDED ONLY   NITROGLYCERIN (NITROSTAT) 0.4 MG SL TABLET    Place 1 tablet (0.4 mg total) under the tongue every 5 (five) minutes as needed for chest pain (up to 3 doses).   OXYCODONE-ACETAMINOPHEN (PERCOCET/ROXICET) 5-325 MG PER TABLET    Take 1-2 tablets by mouth every 6 (six) hours as needed for severe pain.   PANTOPRAZOLE (PROTONIX) 40 MG TABLET    Take 1 tablet (40 mg total) by mouth at bedtime.   SERTRALINE (ZOLOFT) 50 MG TABLET    TAKE 1 TABLET DAILY  SOTALOL (BETAPACE) 80 MG TABLET    Take 1 tablet (80 mg total) by mouth 2 (two) times daily.   WARFARIN (COUMADIN) 3 MG TABLET    Take as directed by coumadin clinic  Modified Medications   No medications on file  Discontinued Medications   DOXYCYCLINE (VIBRA-TABS) 100 MG TABLET    Take 1 tablet (100 mg total) by mouth 2 (two) times daily.   FLUTICASONE-SALMETEROL (ADVAIR DISKUS) 250-50 MCG/DOSE AEPB    Inhale 1 puff into the lungs 2 (two) times daily.

## 2014-08-21 NOTE — Progress Notes (Signed)
Subjective:    Patient ID: [name] [name], male    DOB: [birth year], 69 y.o.   MRN: [id number]  HPI The patient is here to assess status of active health conditions.  PMH, FH, & Social History reviewed & updated.  He eats red meat frequently and fried foods occasionally. He walks 15-20 minutes 3-4 times per week without symptoms other than occasional exertional dyspnea. His blood pressure ranges in the 120s over 70-80s. He has been compliant with his medications without adverse effects.  His last extended hospitalization was in September 2015 [place], [place] for A fib. He was monitored over night in December 2015 for paroxysmal atrial fibrillation.  He has had anemia in the past. He has a follow-up with his GI specialist in August of this year. Last colonoscopy was in July 2013; 2 tubular adenomas were removed. His father had colon cancer.  Review of Systems  Chest pain, palpitations, tachycardia, paroxysmal nocturnal dyspnea, claudication or edema are absent. No unexplained weight loss, abdominal pain, significant dyspepsia, dysphagia, melena, rectal bleeding, or persistently small caliber stools. Dysuria, pyuria, hematuria, frequency, nocturia or polyuria are denied. Change in hair, skin, nails denied. No bowel changes of constipation or diarrhea. No intolerance to heat or cold.      Objective:   Physical Exam  Gen.: Adequately nourished in appearance. Alert, appropriate and cooperative throughout exam. BMI: Appears younger than stated age  Head: Normocephalic without obvious abnormalities;  pattern alopecia  Eyes: No corneal or conjunctival inflammation noted. Pupils equal round reactive to light and accommodation. Extraocular motion intact.  Ears: External  ear exam reveals no significant lesions or deformities. Canals clear .TMs normal. Hearing is grossly decreased on the left Nose: External nasal exam reveals no deformity or inflammation. Nasal mucosa are pink and moist. No  lesions or exudates noted.   Mouth: Oral mucosa and oropharynx reveal no lesions or exudates. Teeth in good repair. Neck: No deformities, masses, or tenderness noted. Range of motion and. Thyroid normal. Lungs: Normal respiratory effort; chest expands symmetrically. Lungs are clear to auscultation without rales, wheezes, or increased work of breathing. Heart: Normal rate and rhythm. Normal S1 and S2. No gallop, click, or rub. No murmur. Abdomen: Protuberant. Bowel sounds normal; abdomen soft and nontender. No masses, organomegaly or hernias noted. Genitalia: Genitalia normal except for left varices. Prostate is normal without enlargement, asymmetry, nodularity, or induration                                   Musculoskeletal/extremities: No deformity or scoliosis noted of  the thoracic or lumbar spine.  No clubbing, cyanosis, edema, or significant extremity  deformity noted. Left hand in a soft brace Range of motion normal . Tone & strength normal. Hand joints normal Fingernail  health good. Able to lie down & sit up w/o help.  Negative SLR bilaterally Vascular: Carotid, radial artery, dorsalis pedis and  posterior tibial pulses are equal. Pedal pulses decreased. No bruits present. Neurologic: Alert and oriented x3. Deep tendon reflexes symmetrical and normal.  Gait normal      Skin: Intact without suspicious lesions or rashes. Lymph: No cervical, axillary, or inguinal lymphadenopathy present. Psych: Mood and affect are normal. Normally interactive  Assessment & Plan:  See Current Assessment & Plan in Problem List under specific Diagnosis

## 2014-08-21 NOTE — Patient Instructions (Signed)
Today we updated your med list in our EPIC system...    Continue your current medications the same...  Continue the SYMBICORT160- 2 sprays twice daily regularly...  Add-in the OTC MUCINEX 600mg  - one tab 4 times daily w/ plenty of water by mouth...  Today we did a Spirometry test and an ambulatory oxygen test... We are also doing a blood test to check for a hereditary form of emphysema... We will arrange for a CT scan of your lungs to check for any underlying parenchymal disease in your lungs...    We will contact you w/ the results when available...   In the meanwhile> we reviewed how critically important exercise is to your ability to get about and "do stuff", esp as you get older...    In this regard you NEED to join the Y, a gym, etc and participate daily in an exercise regime- with or without a trainer as long as you keep it up...  Call for any questions...  Let's plan a follow up visit in 4-6 weeks.Marland KitchenMarland Kitchen

## 2014-08-21 NOTE — Assessment & Plan Note (Signed)
GI appointment 8/16

## 2014-08-21 NOTE — Assessment & Plan Note (Signed)
A1c

## 2014-08-23 ENCOUNTER — Other Ambulatory Visit: Payer: Self-pay | Admitting: Internal Medicine

## 2014-08-23 NOTE — Telephone Encounter (Signed)
RX faxed to pharm for Ativan

## 2014-08-23 NOTE — Telephone Encounter (Signed)
OK X1 

## 2014-08-24 LAB — ALPHA-1 ANTITRYPSIN PHENOTYPE: A-1 Antitrypsin: 136 mg/dL (ref 83–199)

## 2014-08-29 ENCOUNTER — Ambulatory Visit (INDEPENDENT_AMBULATORY_CARE_PROVIDER_SITE_OTHER)
Admission: RE | Admit: 2014-08-29 | Discharge: 2014-08-29 | Disposition: A | Discharge status: Home / Self Care | Payer: Medicare Other | Source: Ambulatory Visit | Attending: Pulmonary Disease | Admitting: Pulmonary Disease

## 2014-08-29 DIAGNOSIS — J439 Emphysema, unspecified: Secondary | ICD-10-CM | POA: Diagnosis not present

## 2014-08-29 DIAGNOSIS — R0600 Dyspnea, unspecified: Secondary | ICD-10-CM | POA: Diagnosis not present

## 2014-08-29 DIAGNOSIS — I700 Atherosclerosis of aorta: Secondary | ICD-10-CM | POA: Diagnosis not present

## 2014-08-29 MED ORDER — IOHEXOL 300 MG/ML  SOLN
80.0000 mL | Freq: Once | INTRAMUSCULAR | Status: AC | PRN
  Administered 2014-08-29: 80 mL via INTRAVENOUS

## 2014-08-30 ENCOUNTER — Telehealth: Payer: Self-pay | Admitting: Pulmonary Disease

## 2014-08-30 NOTE — Telephone Encounter (Signed)
Called and spoke to pt's wife, Santiago Glad. Santiago Glad stated pt has not improved since being treated for pred and abx. Pt c/o SOB, prod cough, fatigue. Appt made with MW on 6/24. Pt verbalized understanding and denied any further questions or concerns at this time.

## 2014-08-31 ENCOUNTER — Encounter: Payer: Self-pay | Admitting: Internal Medicine

## 2014-08-31 ENCOUNTER — Telehealth: Payer: Self-pay | Admitting: Internal Medicine

## 2014-08-31 ENCOUNTER — Ambulatory Visit (INDEPENDENT_AMBULATORY_CARE_PROVIDER_SITE_OTHER): Payer: Medicare Other | Admitting: Internal Medicine

## 2014-08-31 VITALS — BP 112/60 | HR 65 | Temp 98.5°F | Ht 74.0 in | Wt 268.0 lb

## 2014-08-31 DIAGNOSIS — E669 Obesity, unspecified: Secondary | ICD-10-CM

## 2014-08-31 DIAGNOSIS — R0609 Other forms of dyspnea: Secondary | ICD-10-CM

## 2014-08-31 DIAGNOSIS — J449 Chronic obstructive pulmonary disease, unspecified: Secondary | ICD-10-CM

## 2014-08-31 NOTE — Telephone Encounter (Signed)
[name] [name] called in and has several questions about pt visit to Pulmonary.  [name] [name] feels it is enfazma and [name] [name] feels there is something going on with his heart and needs to got to Cardiology?  She wanted [name] [name] to suggest a good Cardo Doc?   She wants to know what he should do?

## 2014-08-31 NOTE — Patient Instructions (Addendum)
Stop symbicort   Change protonix Take 30-60 min before first meal of the day   GERD (REFLUX)  is an extremely common cause of respiratory symptoms just like yours , many times with no obvious heartburn at all.    It can be treated with medication, but also with lifestyle changes including elevation of the head of your bed (ideally with 6 inch  bed blocks),  Smoking cessation, avoidance of late meals, excessive alcohol, and avoid fatty foods, chocolate, peppermint, colas, red wine, and acidic juices such as orange juice.  NO MINT OR MENTHOL PRODUCTS SO NO COUGH DROPS  USE SUGARLESS CANDY INSTEAD (Jolley ranchers or Stover's or Life Savers) or even ice chips will also do - the key is to swallow to prevent all throat clearing. NO OIL BASED VITAMINS - use powdered substitutes.    Keep your follow up appt to see Hochrein because your pulse rate with exercise is your only abnormality but if this problem remains undiagnosed I strongly recommend a CPST  And no reason to do any pulmonary follow up until you've had that test

## 2014-08-31 NOTE — Telephone Encounter (Signed)
He has an excellent Cardiologist, Dr Percival Spanish. I consider him one of the best

## 2014-08-31 NOTE — Progress Notes (Signed)
Subjective:     Patient ID: [name] [name], male   DOB: 1945/09/28, .   MRN: [id number]  Brief patient profile:   referred by [name] [name] for a pulmonary evaluation due to complaints of dyspnea>    Quit smoking 2002 with criteria for GOLD II (barely) on pfts 07/25/2013 with unexplained sob x around 2010 (pt really unsure when last breathed ok)   History of Present Illness  Eval by [name] [name] 08/21/14: 69 y/o WM, [name] [name] is an ex-smoker w/ a hx COPD, prev pneumonia, & freq URIs 2-3 times yearly that are hard to shake-off;  He has experienced progressive DOE since his retirement & now notes DOE walking to [employer] & back;  He started smoking in his teens, smoked up to 2ppd for 25 yrs then switched to a pipe & quit all smoking in 2002 he says (50-60 pack-year smoking);  He notes the freq bronchitic exacerbations and requires antibiotics and Pred to resolve;  He had pneumonia about 5 yrs ago, hosp x 5d in [place] w/ extensive testing he says;  Current symotms include the SOB, cough, sm amt beige sput, no hemoptysis, no CP etc;  He was treated several weeks ago in [place] for a COPD exac w/ Doxy & Pred;  He is a retired Regulatory affairs officer & denies occup exposures;  There is a FamHx of lung dis in his father (COPD, smoker), mother (COPD, smoker)... Current Meds> Symbicort160-2spBid, Hycodan prn.  NOTE> he saw [name] [name] in 2013 & 2015 w/ similar severe DOE- on Symbicort160 & Spiriva added but he didn't continue and was a no show for Full PFTs & didn't f/ up...      He is followed by [name] [name] for CARDS> HBP, CAD, PAFib, modMR & septal hypertrophy, hx of DVT & PE (aqfter back surg in 2010);  Last seen 02/20/14 & his note is reviewed... Holding NSR on Sotalol80Bid, Plavix75, Coumadin; prev Cardizem & BBlocker was stopped in 2015 for orthostsis; they noted poor exercise tolerance on stress test in 2015; Cath showed high grade LAD stenosis- s/p angioplasty & stent w/ improved breathing after that but still deconditioned, therefore  did cardiac rehab w/ some benefit but he didn't continue home exercise after that... 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       EXAM revealed Afeb, VSS, O2sat=96% on RA; Wt=264#, 6'2"Tall, BMI=31-2;  HEENT- neg, mallampati2;  Chest- few scat rhonchi & end-exp wheezing;  Heart- RR Gr1/6SEM w/o r/g;  Abd- neg; Ext- w/o c/c/e...  Full PFTs 07/25/13 showed FVC=4.08 (79%), FEV1=2.78 (73%), %1sec=68, mid-flows reduced at 65% predicted; FEV1 improved 5% after bronchodil; TLC=6.38 (81%);  DLCO=46%  Spirometry 08/21/14 showed FVC=3.82 (75%), FEV1=2.65 (68%), %1sec=69, mid-flows reduced at 48% predicted; c/w mild airflow obstruction and GOLD Stage 2 COPD/Emphysema...  Ambulatory oxygen saturation test 08/21/14> O2sat=96% on RA at rest w/ pulse=65/min; he ambulated 3 laps in the office w/ lowest O2sat=94% w/ pulse=75/min...   LABS 08/2014:  Chems- ok w/ BS=103, A1c=6.2;  CBC- ok w/ Hg=12.3, Fe- wnl,  B12=441; TSH=1.73;  Alpha-1-Antitrypsin==> level= 136 (83-199), and Phenotype= MM  CT Chest => norm heart size, aortic atherosclerosis & calcif in the coronaries, COPD/ paraseptal emphysema is confirmed, some scarring on right, DDD & T8 compression w/o change... IMP/PLAN>>  [name] [name] has multifactorial dyspnea w/ COPD/Emphysema, CAD, deconditioning all playing a roll;  COPD=> mixed CB/E w/ freq exac;  Hx DVT/ PE on coumadin (occurred 2010 after back surg);  We discussed checking a CT Chest and A1AT level, continue RX w/ SYMBICORT160-2spBid, add [id number] w/ fluids, and push a gradual exercise program... He will follow up w/e in 6weeks... rec Today we updated your med list in our EPIC system...    Continue your current medications the same...  Continue the SYMBICORT160- 2 sprays twice daily regularly...  Add-in the OTC MUCINEX 600mg  -  one tab 4 times daily w/ plenty of water by mouth...  Today we did a Spirometry test and an ambulatory oxygen test... We are also doing a blood test to check for a hereditary form of emphysema... We will arrange for a CT scan of your lungs to check for any underlying parenchymal disease in your lungs...    We will contact you w/ the results when available...   In the meanwhile> we reviewed how critically important exercise is to your ability to get about and "do stuff", esp as you get older...    In this regard you NEED to join the Y, a gym, etc and participate daily in an exercise regime- with or without a trainer as long as you keep it up...       08/31/2014 f/u ov/[name] [name] re: unexplained ex sob  Chief Complaint  Patient presents with  . Acute Visit    Pt states cough, SOB and fatigue are no better since his last visit.  Cough is non prod.      Last felt good years ago but this symptom is extremely variable Feel variably can't  cross the room but at his best- which happens daily - best = 100 yards and has to stop whether he walks slow or moderate pace Never trouble at rest or sleep with breathing Inhalers make no difference / lots of throat clearing but no excess mucus  Wt when quit smoking 220 and 268 now   No obvious patterns in  day to day or daytime variability in terms of doe or assoc or presyncope/cp or chest tightness, subjective wheeze overt sinus or hb symptoms. No unusual exp hx or h/o childhood pna/ asthma or knowledge of premature birth.  Sleeping ok without nocturnal  or early am exacerbation  of respiratory  c/o's or need for noct saba. Also denies any obvious fluctuation of symptoms with weather or environmental changes or other aggravating or alleviating factors except as outlined above   Current Medications, Allergies, Complete Past Medical History, Past Surgical History, Family History, and Social History were reviewed in [employer]  record.  ROS  The following are not active complaints unless bolded sore throat, dysphagia, dental problems, itching, sneezing,  nasal congestion or excess/ purulent secretions, ear ache,   fever, chills, sweats, unintended wt loss, pleuritic or exertional cp, hemoptysis,  orthopnea pnd or leg swelling, presyncope, palpitations, abdominal pain, anorexia, nausea, vomiting, diarrhea  or change in bowel or urinary habits, change in stools or urine, dysuria,hematuria,  rash, arthralgias, visual complaints, headache, numbness weakness or ataxia or problems with walking or coordination,  change in mood/affect or memory.            Objective:   Physical Exam  Obese wm nad  Wt Readings from Last 3 Encounters:  08/31/14 268 lb (121.564 kg)  08/21/14 264 lb (119.75 kg)  08/20/14 265 lb (120.203 kg)    Vital signs reviewed  HEENT: nl dentition, turbinates, and orophanx. Nl external ear  canals without cough reflex   NECK :  without JVD/Nodes/TM/ nl carotid upstrokes bilaterally   LUNGS: no acc muscle use, clear to A and P bilaterally without cough on insp or exp maneuvers   CV:  RRR  no s3 or murmur or increase in P2, no edema   ABD:  soft and nontender with nl excursion in the supine position. No bruits or organomegaly, bowel sounds nl  MS:  warm without deformities, calf tenderness, cyanosis or clubbing  SKIN: warm and dry without lesions    NEURO:  alert, approp, no deficits        I personally reviewed images and agree with radiology impression as follows:  CT with contrast:   08/29/14 1. No acute cardiopulmonary abnormalities. 2. Aortic atherosclerosis 3. Emphysema            Assessment:

## 2014-09-01 ENCOUNTER — Encounter: Payer: Self-pay | Admitting: Internal Medicine

## 2014-09-01 NOTE — Assessment & Plan Note (Signed)
-   08/31/2014  Walked RA x 3 laps @ 185 ft each @ bricsk pace stopped due end of study  But 2nd lap noted  erratic pulse / bradycardia correlated with sob/ not desat  but could then finish the third lap once pulse picked back up   Very unusual case and should be easily reproducible and detectable with either 24 holter with diary (since he has it daily ) a GXT, or if needed a full cpst  However, further pulmonary eval at this point is not needed  I had an extended final summary discussion with the patient reviewing all relevant studies completed to date x 25 m - Each maintenance medication was reviewed in detail including most importantly the difference between maintenance and as needed and under what circumstances the prns are to be used.  Please see instructions for details which were reviewed in writing and the patient given a copy.

## 2014-09-01 NOTE — Assessment & Plan Note (Addendum)
pfts 07/25/13 with ERV 53 c/w effects of obesity   Body mass index is 34.39 kg/(m^2).  Lab Results  Component Value Date   TSH 1.73 08/21/2014     Contributing to gerd tendency/ doe/ needs to achieve and maintain neg calorie balance > f/u primary care

## 2014-09-01 NOTE — Assessment & Plan Note (Addendum)
-   Quit smoking 2002 at wt 220  -PFTs 07/25/13 FEV1  2.94 (77%) ratio 69 s reversibility  and dlco 46 corrects to 56 - spirometry 08/21/14 FEV1  2.65 (68%) ratio 69  - 6/254/2016  Walked RA x 3 laps @ 185 ft each stopped due to sob and erratic pulse / bradycardia correlated with sob/ not desat    When respiratory symptoms begin or become refractory well after a patient reports complete smoking cessation,  Especially when this wasn't the case while they were smoking, a red flag is raised based on the work of Dr Kris Mouton which states:  if you quit smoking when your best day FEV1 is still well preserved it is highly unlikely you will progress to severe disease.  That is to say, once the smoking stops,  the symptoms should not suddenly erupt or markedly worsen.  If so, the differential diagnosis should include  obesity/deconditioning,  LPR/Reflux/Aspiration syndromes,  occult CHF or heart dz, or  especially side effect of medications commonly used in this population like Betapace   His reproducible/ proportional doe is likely wt related and I see no evidence of limiting airflow obst so ok to continue betapace and focus on the part of his hx that is unexpainable > see dyspnea

## 2014-09-04 ENCOUNTER — Ambulatory Visit: Payer: Medicare Other | Admitting: Pharmacist Clinician (PhC)/ Clinical Pharmacy Specialist

## 2014-09-06 ENCOUNTER — Ambulatory Visit (INDEPENDENT_AMBULATORY_CARE_PROVIDER_SITE_OTHER): Payer: Medicare Other | Admitting: Cardiology

## 2014-09-06 VITALS — BP 106/72 | HR 58 | Ht 74.0 in | Wt 269.3 lb

## 2014-09-06 DIAGNOSIS — R0602 Shortness of breath: Secondary | ICD-10-CM | POA: Diagnosis not present

## 2014-09-06 NOTE — Patient Instructions (Signed)
Your physician recommends that you schedule a follow-up appointment in: 3 Months  Your physician has requested that you have an exercise tolerance test. For further information please visit [email or web address]. Please also follow instruction sheet, as given.

## 2014-09-06 NOTE — Progress Notes (Signed)
HPI  The patient presents for follow up of atrial fibrillation, HTN, prior DVT and pulmonary emboli and CAD.   He was in [place] at the end of Sept and went into atrial fiband he developed symptoms. [name] [name]  He was hospitalized at the [facility].  He did have a nuclear stress test which was negative.  He was started on Sotalol but his QTc increased on 120 mg BID.  He was discharged on 80 mg BID.  HeThe patient says did require cardioversion.  Of note an echocardiogram did demonstrate MR.  He did have some bradycardia and orthostasis during that hospitalization.  Of note he was also screened for septal hypertrophy as there was some of this with SAM noted on the echo.  However, MRI did not confirm this.      He saw Dr. Melvyn Novas he was he was walked around the office.  During that time he had his usual dyspnea and his heart rate dropped with activity.  Dr. Melvyn Novas did not think that the patient had a pulmonary etiology to his symptoms.  The patient says that he will get SOB when he walks up hill from his mailbox but not slowly around a track.  He is trying to do this routinely.    Allergies  Allergen Reactions  . Penicillins Anaphylaxis     Because of a history of documented adverse serious drug reaction;Medi Alert bracelet  is recommended    Current Outpatient Prescriptions  Medication Sig Dispense Refill  . atorvastatin (LIPITOR) 10 MG tablet Take 1 tablet (10 mg total) by mouth every evening. (Patient taking differently: Take 10 mg by mouth daily. ) 90 tablet 3  . clopidogrel (PLAVIX) 75 MG tablet TAKE 1 TABLET DAILY WITH BREAKFAST 90 tablet 0  . LORazepam (ATIVAN) 1 MG tablet TAKE 1/2-1 TABLET BY MOUTH DAILY ONLY ASNEEDED 30 tablet 0  . nitroGLYCERIN (NITROSTAT) 0.4 MG SL tablet Place 1 tablet (0.4 mg total) under the tongue every 5 (five) minutes as needed for chest pain (up to 3 doses). 25 tablet 1  . pantoprazole (PROTONIX) 40 MG tablet Take 1 tablet (40 mg total) by mouth at bedtime. 90  tablet 4  . sertraline (ZOLOFT) 50 MG tablet TAKE 1 TABLET DAILY 90 tablet 0  . sotalol (BETAPACE) 80 MG tablet Take 1 tablet (80 mg total) by mouth 2 (two) times daily. 180 tablet 1  . SYMBICORT 160-4.5 MCG/ACT inhaler Take 2 puffs by mouth 2 (two) times daily.    [name] [name] warfarin (COUMADIN) 3 MG tablet Take as directed by coumadin clinic (Patient taking differently: Take 3-6 mg by mouth daily. Take 1.5 tablets on (Mon, Tues, Wed, Fri, Sat) & Take 2 tablets on (Sun & Thurs)) 180 tablet 0   No current facility-administered medications for this visit.    Past Medical History  Diagnosis Date  . BPH (benign prostatic hypertrophy)   . Spinal stenosis     [place]  . Hypertension   . Asthma   . DVT (deep venous thrombosis)     a. 2010 Lower ext s/p back surgery.  . Pulmonary embolism     a. 2010 in setting of DVT post-op back surgery. b. Low prob VQ 07/2013.  [name] [name] PAF (paroxysmal atrial fibrillation)     a. Flecainide discontinued 07/2013 in setting of CAD.  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 COPD (chronic obstructive pulmonary disease)     a. 07/2013 PFT's mild airflow obstruction, no restriction, sev decrease in DLCO.  [name] [name]  GERD (gastroesophageal reflux disease)   . Diverticulosis   . Hx of echocardiogram 2015    Echo (06/2013): EF 60-65% normal wall motion, normal diastolic function, aortic sclerosis without stenosis, Trivial MR, mild SAM due to long, redundant mitral leaflets, mild RAE, normal RVSF  . Dyspnea on exertion     a. 07/2013 PFT's mild airflow obstr   . CAD (coronary artery disease)     a. 07/2013: s/p DES to LAD, normal LVF.  . Macrocytic anemia     a. 07/2013: documented on prior labs.  . Obesity   . Transaminitis     a. 07/2013: mild.  . Complication of anesthesia     "I got all kinds of hallucinations"  . Pneumonia ~ 2010 X 1  . History of hiatal hernia   . Arthritis   . History of gout     Past Surgical History  Procedure Laterality Date  . Vasectomy    . Lumbar fusion   02/2008  . Total hip arthroplasty Bilateral   . Cataract extraction w/ intraocular lens  implant, bilateral Bilateral   . Colonoscopy w/ polypectomy  2004  . Joint replacement    . Anterior lumbar disc arthroplasty  03/2008    "spacer poped out; had to repair"  . Coronary angioplasty with stent placement  08/08/2013    "1"  . Left and right heart catheterization with coronary angiogram N/A 08/07/2013    Procedure: LEFT AND RIGHT HEART CATHETERIZATION WITH CORONARY ANGIOGRAM;  Surgeon: Peter M Martinique, MD;  Location: [facility] CATH LAB;  Service: Cardiovascular;  Laterality: N/A;  . Percutaneous coronary stent intervention (pci-s)  08/07/2013    Procedure: PERCUTANEOUS CORONARY STENT INTERVENTION (PCI-S);  Surgeon: Peter M Martinique, MD;  Location: [facility] CATH LAB;  Service: Cardiovascular;;  . Colonoscopy with polypectomy  09/2011    2 tubular adenomas    ROS:  As stated in the HPI and negative for all other systems.  PHYSICAL EXAM BP 106/72 mmHg  Pulse 58  Ht 6\' 2"  (1.88 m)  Wt 269 lb 4.8 oz (122.154 kg)  BMI 34.56 kg/m2 GENERAL:  Well appearing HEENT:  Pupils equal round and reactive, fundi not visualized, oral mucosa unremarkable NECK:  No jugular venous distention, waveform within normal limits, carotid upstroke brisk and symmetric, no bruits, no thyromegaly LYMPHATICS:  No cervical, inguinal adenopathy LUNGS:  Clear to auscultation bilaterally BACK:  No CVA tenderness CHEST:  Unremarkable HEART:  PMI not displaced or sustained,S1 and S2 within normal limits, no S3, no S4, no clicks, no rubs, no murmurs ABD:  Flat, positive bowel sounds normal in frequency in pitch, no bruits, no rebound, no guarding, no midline pulsatile mass, no hepatomegaly, no splenomegaly, obese EXT:  2 plus pulses throughout, no edema, no cyanosis no clubbing, rash SKIN:  No rashes no nodules NEURO:  Cranial nerves II through XII grossly intact, motor grossly intact throughout PSYCH:  Cognitively intact, oriented to person  place and time  EKG:  Sinus rhythm, rate 58, axis within normal limits, intervals within normal limits, no acute ST-T wave changes.  09/06/2014  ASSESSMENT AND PLAN  ATRIAL FIBRILLATION:  He has not had any further symptomatic episodes of atrial fib.  At this point no change in therapy is indicated.   DYSPNEA:  Given the description of his heart rate falling with exercise in Dr. Gustavus Bryant office, I will bring him back to try to reproduce this on a treadmill.  Given his limited exercise capacity this will need to be  modified Bruce protocol.   CAD:   Now that it has been a year since his PCI, I will consider stopping the Plavix.  I will however, wait until after the POET (Plain Old Exercise Treadmill)  HTN:   He has had some orthostatic symptoms in the past.  No change in therapy is planned at this point however.    MR:  He had some moderate MR and mild septal hypertrophy.  I will follow this clinically and with repeat echocardiography.  No further work up is indicated at this point.    PREOP:  The patient is due to have some dental surgery.  He will be able to hold his warfarin for this.  He will be able to hold the Plavix as needed for this as well.

## 2014-09-07 ENCOUNTER — Other Ambulatory Visit: Payer: Self-pay | Admitting: Internal Medicine

## 2014-09-07 ENCOUNTER — Encounter: Payer: Self-pay | Admitting: Cardiology

## 2014-09-07 ENCOUNTER — Other Ambulatory Visit: Payer: Self-pay | Admitting: Cardiology

## 2014-09-07 NOTE — Telephone Encounter (Signed)
zoloft rx sent to pharm

## 2014-09-12 ENCOUNTER — Ambulatory Visit (HOSPITAL_COMMUNITY)
Admission: RE | Admit: 2014-09-12 | Discharge: 2014-09-12 | Disposition: A | Discharge status: Home / Self Care | Payer: Medicare Other | Source: Ambulatory Visit | Attending: Cardiovascular Disease | Admitting: Cardiovascular Disease

## 2014-09-12 ENCOUNTER — Telehealth (HOSPITAL_COMMUNITY): Payer: Self-pay

## 2014-09-12 DIAGNOSIS — R0602 Shortness of breath: Secondary | ICD-10-CM | POA: Diagnosis not present

## 2014-09-12 LAB — EXERCISE TOLERANCE TEST
CHL RATE OF PERCEIVED EXERTION: 15
CSEPED: 8 min
CSEPEW: 4.6 METS
MPHR: 150 {beats}/min
Peak HR: 113 {beats}/min
Percent HR: 75 %
Rest HR: 90 {beats}/min

## 2014-09-12 NOTE — Telephone Encounter (Signed)
Encounter complete. 

## 2014-09-13 ENCOUNTER — Telehealth: Payer: Self-pay | Admitting: Cardiology

## 2014-09-13 ENCOUNTER — Ambulatory Visit: Payer: Medicare Other | Admitting: Cardiology

## 2014-09-13 NOTE — Telephone Encounter (Signed)
Spoke to wife She states she had a phone call this morning RN informed wife - no record of a call, but Dr Percival Spanish is here today- He may have call - will follow -up and call wife back.

## 2014-09-13 NOTE — Telephone Encounter (Signed)
Pt's wife says that she received a call from our office and she thinks that it may be in regards to the stress test that he had yesterday. Please call back  Thanks

## 2014-09-13 NOTE — Telephone Encounter (Signed)
RN spoke to Dr Percival Spanish  he called patient earlier today will try later on   RN informed wife. She verbalized understanding.

## 2014-09-14 NOTE — Telephone Encounter (Signed)
Left message to call back, no word from Dr Percival Spanish as yet.

## 2014-09-14 NOTE — Telephone Encounter (Signed)
Spoke to wife  RN informed WIFE TO DISREGARD PREVIOUS MESSAGE SHE VERBALIZED UNDERSTANDING. [place] SPOKE TO HUSBAND THIS MORNING.

## 2014-09-17 ENCOUNTER — Other Ambulatory Visit: Payer: Self-pay | Admitting: Internal Medicine

## 2014-09-17 DIAGNOSIS — M8700 Idiopathic aseptic necrosis of unspecified bone: Secondary | ICD-10-CM

## 2014-09-17 DIAGNOSIS — Z8781 Personal history of (healed) traumatic fracture: Secondary | ICD-10-CM

## 2014-09-18 ENCOUNTER — Ambulatory Visit: Payer: Medicare Other | Admitting: Pulmonary Disease

## 2014-09-19 ENCOUNTER — Other Ambulatory Visit: Payer: Self-pay | Admitting: Internal Medicine

## 2014-09-19 ENCOUNTER — Telehealth: Payer: Self-pay | Admitting: Cardiology

## 2014-09-19 NOTE — Telephone Encounter (Signed)
Please advise. Last OV 08/20/14

## 2014-09-19 NOTE — Telephone Encounter (Signed)
OK X1  My retirement date is 03/09/2015; but I will be in office on a limited schedule Oct-Dec. To guarantee continuity of care you should transition your care to another PCP by Oct 1,2016.     

## 2014-09-19 NOTE — Telephone Encounter (Signed)
Returned call to patient's wife.She stated husband is scheduled for hand surgery tomorrow.Stated husband is tired today.No chest pain.Stated he did go to [employer] yesterday and walked around.Stated she wanted to make sure with Dr.Hochrein if he is cleared for surgery.Message sent to [place] for advice.

## 2014-09-19 NOTE — Telephone Encounter (Signed)
Mrs. Wenrich called and stated that Mr. McCarhty is just tired and is ok

## 2014-09-19 NOTE — Telephone Encounter (Signed)
Pt's wife called in concerned about the pt stating that they went to [facility] yesterday and since then he has been very tired. She is concerned because he is suppose to go in to surgery tomorrow and wanted to know if Dr. Percival Spanish needs to check him out prior. Please call  Thanks

## 2014-09-19 NOTE — Telephone Encounter (Signed)
error 

## 2014-09-20 DIAGNOSIS — M19032 Primary osteoarthritis, left wrist: Secondary | ICD-10-CM | POA: Diagnosis not present

## 2014-09-20 DIAGNOSIS — G8918 Other acute postprocedural pain: Secondary | ICD-10-CM | POA: Diagnosis not present

## 2014-09-20 DIAGNOSIS — M12532 Traumatic arthropathy, left wrist: Secondary | ICD-10-CM | POA: Diagnosis not present

## 2014-10-01 ENCOUNTER — Ambulatory Visit (INDEPENDENT_AMBULATORY_CARE_PROVIDER_SITE_OTHER): Payer: Medicare Other | Admitting: Pharmacist Clinician (PhC)/ Clinical Pharmacy Specialist

## 2014-10-01 DIAGNOSIS — Z86718 Personal history of other venous thrombosis and embolism: Secondary | ICD-10-CM | POA: Diagnosis not present

## 2014-10-01 DIAGNOSIS — I480 Paroxysmal atrial fibrillation: Secondary | ICD-10-CM | POA: Diagnosis not present

## 2014-10-01 LAB — POCT INR: INR: 2

## 2014-10-04 DIAGNOSIS — Z4789 Encounter for other orthopedic aftercare: Secondary | ICD-10-CM | POA: Diagnosis not present

## 2014-10-06 ENCOUNTER — Other Ambulatory Visit: Payer: Self-pay | Admitting: Internal Medicine

## 2014-10-09 ENCOUNTER — Other Ambulatory Visit: Payer: Self-pay | Admitting: Emergency Medicine

## 2014-10-09 MED ORDER — SOTALOL HCL 80 MG PO TABS: 80.0000 mg | ORAL_TABLET | Freq: Two times a day (BID) | ORAL | Status: DC

## 2014-10-13 ENCOUNTER — Other Ambulatory Visit: Payer: Self-pay | Admitting: Internal Medicine

## 2014-10-16 ENCOUNTER — Ambulatory Visit (INDEPENDENT_AMBULATORY_CARE_PROVIDER_SITE_OTHER): Payer: Medicare Other | Admitting: Gastroenterology

## 2014-10-16 ENCOUNTER — Encounter: Payer: Self-pay | Admitting: Gastroenterology

## 2014-10-16 ENCOUNTER — Ambulatory Visit
Admission: RE | Admit: 2014-10-16 | Discharge: 2014-10-16 | Disposition: A | Discharge status: Home / Self Care | Payer: Medicare Other | Source: Ambulatory Visit | Attending: Internal Medicine | Admitting: Internal Medicine

## 2014-10-16 VITALS — BP 104/72 | HR 66 | Ht 74.0 in | Wt 270.2 lb

## 2014-10-16 DIAGNOSIS — M8700 Idiopathic aseptic necrosis of unspecified bone: Secondary | ICD-10-CM

## 2014-10-16 DIAGNOSIS — D126 Benign neoplasm of colon, unspecified: Secondary | ICD-10-CM

## 2014-10-16 DIAGNOSIS — Z800 Family history of malignant neoplasm of digestive organs: Secondary | ICD-10-CM

## 2014-10-16 DIAGNOSIS — Z86010 Personal history of colonic polyps: Secondary | ICD-10-CM

## 2014-10-16 DIAGNOSIS — Z8781 Personal history of (healed) traumatic fracture: Secondary | ICD-10-CM

## 2014-10-16 MED ORDER — NA SULFATE-K SULFATE-MG SULF 17.5-3.13-1.6 GM/177ML PO SOLN
1.0000 | Freq: Once | ORAL | Status: DC
Start: 2014-10-16 — End: 2015-01-01

## 2014-10-16 NOTE — Assessment & Plan Note (Signed)
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  Exam was incomplete because of poor prep.  Plan follow-up colonoscopy.  The risk of holding anticoagulation therapy or antiplatelet medications was discussed including the increased risk for thromboembolic disease that may include DVT, pulmonary emboli and stroke. The patient understands this risk and is willing to proceed with temporally holding the medication provided that this is approved by her PCP or cardiologist.

## 2014-10-16 NOTE — Progress Notes (Signed)
_                                                                                                                History of Present Illness:  Mr. Oommen is a 69 year old white male with history of DVT, pulmonary emboli, atrial fibrillation, on Coumadin, family history of colon cancer and personal history of colon polyps referred for follow-up colonoscopy.  Last colonoscopy in 2013 was limited by a poor prep.  Small adenomatous cecal polyps were removed.  He has no GI complaints.   Past Medical History  Diagnosis Date  . BPH (benign prostatic hypertrophy)   . Spinal stenosis     Congential  . Hypertension   . Asthma   . DVT (deep venous thrombosis)     a. 2010 Lower ext s/p back surgery.  . Pulmonary embolism     a. 2010 in setting of DVT post-op back surgery. b. Low prob VQ 07/2013.  [name] PAF (paroxysmal atrial fibrillation)     a. Flecainide discontinued 07/2013 in setting of CAD.  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 COPD (chronic obstructive pulmonary disease)     a. 07/2013 PFT's mild airflow obstruction, no restriction, sev decrease in DLCO.  [name] GERD (gastroesophageal reflux disease)   . Diverticulosis   . Hx of echocardiogram 2015    Echo (06/2013): EF 60-65% normal wall motion, normal diastolic function, aortic sclerosis without stenosis, Trivial MR, mild SAM due to long, redundant mitral leaflets, mild RAE, normal RVSF  . Dyspnea on exertion     a. 07/2013 PFT's mild airflow obstr   . CAD (coronary artery disease)     a. 07/2013: s/p DES to LAD, normal LVF.  . Macrocytic anemia     a. 07/2013: documented on prior labs.  . Obesity   . Transaminitis     a. 07/2013: mild.  . Complication of anesthesia     "I got all kinds of hallucinations"  . Pneumonia ~ 2010 X 1  . History of hiatal hernia   . Arthritis   . History of gout    Past Surgical History  Procedure Laterality Date  . Vasectomy    . Lumbar fusion  02/2008  . Total hip arthroplasty Bilateral   .  Cataract extraction w/ intraocular lens  implant, bilateral Bilateral   . Colonoscopy w/ polypectomy  2004  . Joint replacement    . Anterior lumbar disc arthroplasty  03/2008    "spacer poped out; had to repair"  . Coronary angioplasty with stent placement  08/08/2013    "1"  . Left and right heart catheterization with coronary angiogram N/A 08/07/2013    Procedure: LEFT AND RIGHT HEART CATHETERIZATION WITH CORONARY ANGIOGRAM;  Surgeon: Peter M Martinique, MD;  Location: [facility] CATH LAB;  Service: Cardiovascular;  Laterality: N/A;  . Percutaneous coronary stent intervention (pci-s)  08/07/2013    Procedure: PERCUTANEOUS CORONARY STENT INTERVENTION (PCI-S);  Surgeon: Peter M Martinique, MD;  Location: [facility] CATH LAB;  Service: Cardiovascular;;  . Colonoscopy  with polypectomy  09/2011    2 tubular adenomas   family history includes Alcohol abuse (age of onset: 54) in his brother; Atrial fibrillation in his mother; COPD in his mother; Colon cancer in his father; Heart attack in his paternal grandfather and paternal uncle; Heart attack (age of onset: 68) in his brother; Heart disease in his father. There is no history of Stroke. Current Outpatient Prescriptions  Medication Sig Dispense Refill  . atorvastatin (LIPITOR) 10 MG tablet Take 1 tablet (10 mg total) by mouth every evening. (Patient taking differently: Take 10 mg by mouth daily. ) 90 tablet 3  . LORazepam (ATIVAN) 1 MG tablet TAKE 1/2-1 TABLET BY MOUTH DAILY ONLY ASNEEDED 30 tablet 0  . nitroGLYCERIN (NITROSTAT) 0.4 MG SL tablet Place 1 tablet (0.4 mg total) under the tongue every 5 (five) minutes as needed for chest pain (up to 3 doses). 25 tablet 1  . pantoprazole (PROTONIX) 40 MG tablet Take 1 tablet (40 mg total) by mouth at bedtime. 90 tablet 4  . sertraline (ZOLOFT) 50 MG tablet TAKE 1 TABLET DAILY 90 tablet 1  . sotalol (BETAPACE) 80 MG tablet Take 1 tablet (80 mg total) by mouth 2 (two) times daily. 180 tablet 1  . SYMBICORT 160-4.5 MCG/ACT inhaler Take  2 puffs by mouth 2 (two) times daily.    [name] warfarin (COUMADIN) 3 MG tablet Take 1.5 to 2 tablets by mouth daily as directed by coumadin clinic 180 tablet 0   No current facility-administered medications for this visit.   Allergies as of 10/16/2014 - Review Complete 10/16/2014  Allergen Reaction Noted  . Penicillins Anaphylaxis 04/23/2006    reports that he quit smoking about 14 years ago. His smoking use included Cigarettes. He has a 80 pack-year smoking history. He has never used smokeless tobacco. He reports that he drinks alcohol. He reports that he does not use illicit drugs.   Review of Systems: Pertinent positive and negative review of systems were noted in the above HPI section. All other review of systems were otherwise negative.  Vital signs were reviewed in today's medical record Physical Exam: General: Well developed , well nourished, no acute distress Skin: anicteric Head: Normocephalic and atraumatic Eyes:  sclerae anicteric, EOMI Ears: Normal auditory acuity Mouth: No deformity or lesions Neck: Supple, no masses or thyromegaly Lymph Nodes: no lymphadenopathy Lungs: Clear throughout to auscultation Heart: Regular rate and rhythm; no murmurs, rubs or bruits Gastroinestinal: Soft, non tender and non distended. No masses, hepatosplenomegaly or hernias noted. Normal Bowel sounds Rectal:deferred Musculoskeletal: Symmetrical with no gross deformities  Skin: No lesions on visible extremities Pulses:  Normal pulses noted Extremities: No clubbing, cyanosis, edema or deformities noted Neurological: Alert oriented x 4, grossly nonfocal Cervical Nodes:  No significant cervical adenopathy Inguinal Nodes: No significant inguinal adenopathy Psychological:  Alert and cooperative. Normal mood and affect  See Assessment and Plan under Problem List

## 2014-10-16 NOTE — Assessment & Plan Note (Signed)
Plan colonoscopy every 5 years 

## 2014-10-16 NOTE — Patient Instructions (Signed)
You will be contaced by our office prior to your procedure for directions on holding your Coumadin/Warfarin.  If you do not hear from our office 1 week prior to your scheduled procedure, please call [phone] to discuss.  You have been scheduled for a colonoscopy. Please follow written instructions given to you at your visit today.  Please pick up your prep supplies at the pharmacy within the next 1-3 days. If you use inhalers (even only as needed), please bring them with you on the day of your procedure. Your physician has requested that you go to www.startemmi.com and enter the access code given to you at your visit today. This web site gives a general overview about your procedure. However, you should still follow specific instructions given to you by our office regarding your preparation for the procedure.

## 2014-10-17 ENCOUNTER — Telehealth: Payer: Self-pay | Admitting: *Deleted

## 2014-10-17 NOTE — Telephone Encounter (Signed)
  10/17/2014   RE: [name] [name] DOB: 1945-05-12 MRN: [id number]   Dear   Dr Percival Spanish     We have scheduled the above patient for an endoscopic procedure. Our records show that he is on anticoagulation therapy.   Please advise as to how long the patient may come off his therapy of Coumadin prior to the procedure, which is scheduled for  12/27/2014  Please fax back/ or route the completed form to [place] at [phone].   Sincerely,    Genella Mech

## 2014-10-17 NOTE — Telephone Encounter (Signed)
Ok to hold warfarin as needed (5 days prior to procedure.)  No bridging necessary.

## 2014-10-18 DIAGNOSIS — Z4789 Encounter for other orthopedic aftercare: Secondary | ICD-10-CM | POA: Diagnosis not present

## 2014-10-22 ENCOUNTER — Ambulatory Visit (INDEPENDENT_AMBULATORY_CARE_PROVIDER_SITE_OTHER): Payer: Medicare Other | Admitting: Pharmacist Clinician (PhC)/ Clinical Pharmacy Specialist

## 2014-10-22 ENCOUNTER — Telehealth: Payer: Self-pay | Admitting: Cardiology

## 2014-10-22 DIAGNOSIS — I4891 Unspecified atrial fibrillation: Secondary | ICD-10-CM

## 2014-10-22 DIAGNOSIS — Z86718 Personal history of other venous thrombosis and embolism: Secondary | ICD-10-CM

## 2014-10-22 DIAGNOSIS — I480 Paroxysmal atrial fibrillation: Secondary | ICD-10-CM

## 2014-10-22 LAB — POCT INR: INR: 2.5

## 2014-10-22 NOTE — Telephone Encounter (Signed)
Pt in office for INR check.  Did not appear drowsy/tired.  Very jovial, did have some cuts on R arm from berry picking over past week.  L arm still in cast.   Did note some cough as patient was leaving, but he did not mention any concerns.

## 2014-10-22 NOTE — Telephone Encounter (Signed)
Returned call to wife. She states concern, pt was very tired yesterday. Slept most of the day. I do not see any recent med changes, she is not sure what might be contributing to this. Informed her may be best handled by PCP. She also notes cough x 4 days. I stated this too is probably best handled by primary care. She voiced understanding.  He has coumadin check today at 26. She wanted to know if he could be seen by provider and/or have BP check. Informed her I would speak w/ Erasmo Downer - see if meds need review - she notes patient usually comes back for his coumadin checks alone and may not share what has been going on with him.  Wife agreeable to plan.

## 2014-10-22 NOTE — Telephone Encounter (Signed)
[name] [name] is calling because [name] [name] has been feeling very fatique and just slept all day on yesterday . Please call    Thanks

## 2014-10-22 NOTE — Telephone Encounter (Signed)
Patient aware to hold coumadin 5 days before procedure

## 2014-10-25 ENCOUNTER — Other Ambulatory Visit: Payer: Self-pay | Admitting: Internal Medicine

## 2014-10-25 NOTE — Telephone Encounter (Signed)
Last refill 09/19/14, last OV 08/20/14

## 2014-10-31 ENCOUNTER — Ambulatory Visit: Payer: Medicare Other | Admitting: Internal Medicine

## 2014-11-01 DIAGNOSIS — Z4789 Encounter for other orthopedic aftercare: Secondary | ICD-10-CM | POA: Diagnosis not present

## 2014-11-01 DIAGNOSIS — M12532 Traumatic arthropathy, left wrist: Secondary | ICD-10-CM | POA: Diagnosis not present

## 2014-11-01 DIAGNOSIS — S52552D Other extraarticular fracture of lower end of left radius, subsequent encounter for closed fracture with routine healing: Secondary | ICD-10-CM | POA: Diagnosis not present

## 2014-11-05 DIAGNOSIS — M12532 Traumatic arthropathy, left wrist: Secondary | ICD-10-CM | POA: Diagnosis not present

## 2014-11-06 ENCOUNTER — Encounter: Payer: Self-pay | Admitting: Internal Medicine

## 2014-11-06 ENCOUNTER — Ambulatory Visit (INDEPENDENT_AMBULATORY_CARE_PROVIDER_SITE_OTHER): Payer: Medicare Other | Admitting: Internal Medicine

## 2014-11-06 ENCOUNTER — Other Ambulatory Visit (INDEPENDENT_AMBULATORY_CARE_PROVIDER_SITE_OTHER): Payer: Medicare Other

## 2014-11-06 VITALS — BP 102/62 | HR 85 | Temp 98.2°F | Resp 20 | Wt 269.0 lb

## 2014-11-06 DIAGNOSIS — I499 Cardiac arrhythmia, unspecified: Secondary | ICD-10-CM

## 2014-11-06 DIAGNOSIS — R42 Dizziness and giddiness: Secondary | ICD-10-CM | POA: Diagnosis not present

## 2014-11-06 DIAGNOSIS — I959 Hypotension, unspecified: Secondary | ICD-10-CM

## 2014-11-06 LAB — CBC WITH DIFFERENTIAL/PLATELET
BASOS ABS: 0.1 10*3/uL (ref 0.0–0.1)
Basophils Relative: 0.5 % (ref 0.0–3.0)
EOS ABS: 0.3 10*3/uL (ref 0.0–0.7)
Eosinophils Relative: 2.8 % (ref 0.0–5.0)
HEMATOCRIT: 40 % (ref 39.0–52.0)
Hemoglobin: 13.4 g/dL (ref 13.0–17.0)
LYMPHS ABS: 2.6 10*3/uL (ref 0.7–4.0)
LYMPHS PCT: 24.9 % (ref 12.0–46.0)
MCHC: 33.5 g/dL (ref 30.0–36.0)
MCV: 102.9 fl — ABNORMAL HIGH (ref 78.0–100.0)
Monocytes Absolute: 1.3 10*3/uL — ABNORMAL HIGH (ref 0.1–1.0)
Monocytes Relative: 12.3 % — ABNORMAL HIGH (ref 3.0–12.0)
NEUTROS PCT: 59.5 % (ref 43.0–77.0)
Neutro Abs: 6.3 10*3/uL (ref 1.4–7.7)
PLATELETS: 468 10*3/uL — AB (ref 150.0–400.0)
RBC: 3.89 Mil/uL — ABNORMAL LOW (ref 4.22–5.81)
RDW: 16.2 % — ABNORMAL HIGH (ref 11.5–15.5)
WBC: 10.5 10*3/uL (ref 4.0–10.5)

## 2014-11-06 LAB — BASIC METABOLIC PANEL
BUN: 16 mg/dL (ref 6–23)
CALCIUM: 9.4 mg/dL (ref 8.4–10.5)
CO2: 28 mEq/L (ref 19–32)
CREATININE: 1.08 mg/dL (ref 0.40–1.50)
Chloride: 106 mEq/L (ref 96–112)
GFR: 71.71 mL/min (ref >60.00)
Glucose, Bld: 89 mg/dL (ref 70–99)
Potassium: 4.3 mEq/L (ref 3.5–5.1)
Sodium: 140 mEq/L (ref 135–145)

## 2014-11-06 NOTE — Progress Notes (Signed)
   Subjective:    Patient ID: [name] [name], male    DOB: [birth year], 69 y.o.   MRN: [id number]  HPI  He is here to be cleared medically prior to traveling out of state to [place] for the holidays. Since last night he's had dizziness when he stands from a supine or sitting position.  He has no cardiac or neurologic prodrome prior to the dizziness.  He does describe bruising on the warfarin but no other bleeding dyscrasias.  His exertional dyspnea has been stable over the last 3 months. He was seen by a Pulmonologist who felt this was multifactorial involving his COPD, coronary artery disease, and deconditioning. The COPD was felt to be mixed with both chronic bronchitis and emphysema components. Symbicort was prescribed twice a day; he has been compliant. He has been walking 15-20 minutes inside a church 2-3 times a week. He denies any significant symptoms with exercise.  His most recent labs revealed minimal anemia with hemoglobin 12.3 and hematocrit 37.1. His PT/INR was 2.5 on 10/22/14. Glucose was 103 on 6/14 but A1c was prediabetic at 6.2%.[name] TSH was therapeutic @ 1.73.  Review of Systems  Denied were any change in heart rhythm or rate prior to the dizziness. There was no associated chest pain or shortness of breath . Also specifically denied prior to the episodes were headache, limb weakness, tingling, or numbness. No seizure activity noted. Epistaxis, hemoptysis, hematuria, melena, or rectal bleeding denied.  There has been  no abnormal  bleeding. There is no difficulty stopping bleeding with injury.    Objective:   Physical Exam Pertinent or positive findings include:  Pupils are small. He has no nystagmus. Extraocular motion is intact. Heart sounds are markedly distant. No carotid bruits were present. He has minor rales with no increased work of breathing. There is no significant wheezing or rhonchi present. Posterior tibial pulses are decreased. He has trace ankle edema. The feet are  cool but ischemic changes are not present. From sitting to standing no BP drop ;but blood pressure was low in the 90/60 range.   General appearance :adequately nourished; in no distress.  Eyes: No conjunctival inflammation or scleral icterus is present.  Heart:  Irregular rate and rhythm. S1 and S2 normal without gallop, murmur, click, rub or other extra sounds    Abdomen: bowel sounds normal, soft and non-tender without masses, organomegaly or hernias noted.  No guarding or rebound.   Vascular : all pulses equal ; no bruits present.  Skin:Warm & dry.  Intact without suspicious lesions or rashes ; no tenting or jaundice   Lymphatic: No lymphadenopathy is noted about the head, neck, axilla.   Neuro: Strength, tone normal.        Assessment & Plan:  #1 atrial fibrillation; ventricular rate is well controlled as per EKG  #2 warfarin anticoagulation; PT/INR therapeutic  #3 postural dizziness  #4 mixed COPD with exertional dyspnea  Plan: CBC and differential; BMET  Isometrics prior to standing.

## 2014-11-06 NOTE — Patient Instructions (Signed)
Perform isometric exercise of calves  ( while seated go up on toes to count of 5 & then onto heels for 5 count). Repeat  4- 5 times prior to standing if you've been seated or supine for any significant period of time as BP drops with such positions.    Your next office appointment will be determined based upon review of your pending labs  and  xrays  Those written interpretation of the lab results and instructions will be transmitted to you by mail for your records.  Critical results will be called.   Followup as needed for any active or acute issue. Please report any significant change in your symptoms.

## 2014-11-06 NOTE — Progress Notes (Signed)
Pre visit review using our clinic review tool, if applicable. No additional management support is needed unless otherwise documented below in the visit note. 

## 2014-11-08 HISTORY — PX: OTHER SURGICAL HISTORY: SHX169

## 2014-11-09 ENCOUNTER — Telehealth: Payer: Self-pay | Admitting: Internal Medicine

## 2014-11-09 NOTE — Telephone Encounter (Signed)
ER only if having chest pain or increased dyspnea (SOB)

## 2014-11-09 NOTE — Telephone Encounter (Signed)
Patient's wife is calling to advise that he has an appointment @ [facility] on Tuesday, but she thinks that he is still in a-fib. She wants to know if she should take him to the ER or keep the appt @ [facility] on Tuesday.

## 2014-11-09 NOTE — Telephone Encounter (Signed)
LVM informing of note.

## 2014-11-09 NOTE — Telephone Encounter (Signed)
Please advise 

## 2014-11-13 DIAGNOSIS — I4891 Unspecified atrial fibrillation: Secondary | ICD-10-CM | POA: Diagnosis not present

## 2014-11-13 DIAGNOSIS — E785 Hyperlipidemia, unspecified: Secondary | ICD-10-CM | POA: Diagnosis present

## 2014-11-13 DIAGNOSIS — I480 Paroxysmal atrial fibrillation: Secondary | ICD-10-CM | POA: Diagnosis not present

## 2014-11-13 DIAGNOSIS — R001 Bradycardia, unspecified: Secondary | ICD-10-CM | POA: Diagnosis present

## 2014-11-13 DIAGNOSIS — Z7982 Long term (current) use of aspirin: Secondary | ICD-10-CM | POA: Diagnosis not present

## 2014-11-13 DIAGNOSIS — J439 Emphysema, unspecified: Secondary | ICD-10-CM | POA: Diagnosis not present

## 2014-11-13 DIAGNOSIS — I10 Essential (primary) hypertension: Secondary | ICD-10-CM | POA: Diagnosis not present

## 2014-11-13 DIAGNOSIS — Z955 Presence of coronary angioplasty implant and graft: Secondary | ICD-10-CM | POA: Diagnosis not present

## 2014-11-13 DIAGNOSIS — Z6833 Body mass index (BMI) 33.0-33.9, adult: Secondary | ICD-10-CM | POA: Diagnosis not present

## 2014-11-13 DIAGNOSIS — I428 Other cardiomyopathies: Secondary | ICD-10-CM | POA: Diagnosis present

## 2014-11-13 DIAGNOSIS — Z23 Encounter for immunization: Secondary | ICD-10-CM | POA: Diagnosis not present

## 2014-11-13 DIAGNOSIS — I421 Obstructive hypertrophic cardiomyopathy: Secondary | ICD-10-CM | POA: Diagnosis not present

## 2014-11-13 DIAGNOSIS — R0600 Dyspnea, unspecified: Secondary | ICD-10-CM | POA: Diagnosis not present

## 2014-11-13 DIAGNOSIS — Z86711 Personal history of pulmonary embolism: Secondary | ICD-10-CM | POA: Diagnosis not present

## 2014-11-13 DIAGNOSIS — Z9861 Coronary angioplasty status: Secondary | ICD-10-CM | POA: Diagnosis not present

## 2014-11-13 DIAGNOSIS — I4581 Long QT syndrome: Secondary | ICD-10-CM | POA: Diagnosis not present

## 2014-11-13 DIAGNOSIS — I481 Persistent atrial fibrillation: Secondary | ICD-10-CM | POA: Diagnosis not present

## 2014-11-13 DIAGNOSIS — Z981 Arthrodesis status: Secondary | ICD-10-CM | POA: Diagnosis not present

## 2014-11-13 DIAGNOSIS — Z7901 Long term (current) use of anticoagulants: Secondary | ICD-10-CM | POA: Diagnosis not present

## 2014-11-13 DIAGNOSIS — Z87891 Personal history of nicotine dependence: Secondary | ICD-10-CM | POA: Diagnosis not present

## 2014-11-13 DIAGNOSIS — R9431 Abnormal electrocardiogram [ECG] [EKG]: Secondary | ICD-10-CM | POA: Diagnosis not present

## 2014-11-13 DIAGNOSIS — Z87898 Personal history of other specified conditions: Secondary | ICD-10-CM | POA: Diagnosis not present

## 2014-11-13 DIAGNOSIS — E782 Mixed hyperlipidemia: Secondary | ICD-10-CM | POA: Diagnosis not present

## 2014-11-13 DIAGNOSIS — J45909 Unspecified asthma, uncomplicated: Secondary | ICD-10-CM | POA: Diagnosis present

## 2014-11-13 DIAGNOSIS — Z96649 Presence of unspecified artificial hip joint: Secondary | ICD-10-CM | POA: Diagnosis present

## 2014-11-13 DIAGNOSIS — I482 Chronic atrial fibrillation: Secondary | ICD-10-CM | POA: Diagnosis not present

## 2014-11-13 DIAGNOSIS — E6609 Other obesity due to excess calories: Secondary | ICD-10-CM | POA: Diagnosis not present

## 2014-11-13 DIAGNOSIS — J449 Chronic obstructive pulmonary disease, unspecified: Secondary | ICD-10-CM | POA: Diagnosis not present

## 2014-11-13 DIAGNOSIS — I2510 Atherosclerotic heart disease of native coronary artery without angina pectoris: Secondary | ICD-10-CM | POA: Diagnosis not present

## 2014-11-14 ENCOUNTER — Encounter: Payer: Self-pay | Admitting: Cardiology

## 2014-11-15 ENCOUNTER — Other Ambulatory Visit: Payer: Self-pay | Admitting: Cardiology

## 2014-11-23 DIAGNOSIS — I481 Persistent atrial fibrillation: Secondary | ICD-10-CM | POA: Diagnosis not present

## 2014-11-26 DIAGNOSIS — I10 Essential (primary) hypertension: Secondary | ICD-10-CM | POA: Diagnosis not present

## 2014-11-26 DIAGNOSIS — Z79899 Other long term (current) drug therapy: Secondary | ICD-10-CM | POA: Diagnosis not present

## 2014-11-26 DIAGNOSIS — D649 Anemia, unspecified: Secondary | ICD-10-CM | POA: Diagnosis not present

## 2014-11-26 DIAGNOSIS — Z7982 Long term (current) use of aspirin: Secondary | ICD-10-CM | POA: Diagnosis not present

## 2014-11-26 DIAGNOSIS — Z86711 Personal history of pulmonary embolism: Secondary | ICD-10-CM | POA: Diagnosis not present

## 2014-11-26 DIAGNOSIS — R42 Dizziness and giddiness: Secondary | ICD-10-CM | POA: Diagnosis not present

## 2014-11-26 DIAGNOSIS — Z96649 Presence of unspecified artificial hip joint: Secondary | ICD-10-CM | POA: Diagnosis not present

## 2014-11-26 DIAGNOSIS — R531 Weakness: Secondary | ICD-10-CM | POA: Diagnosis not present

## 2014-11-26 DIAGNOSIS — I4891 Unspecified atrial fibrillation: Secondary | ICD-10-CM | POA: Diagnosis not present

## 2014-11-26 DIAGNOSIS — Z87891 Personal history of nicotine dependence: Secondary | ICD-10-CM | POA: Diagnosis not present

## 2014-11-26 DIAGNOSIS — J45909 Unspecified asthma, uncomplicated: Secondary | ICD-10-CM | POA: Diagnosis not present

## 2014-11-26 DIAGNOSIS — R791 Abnormal coagulation profile: Secondary | ICD-10-CM | POA: Diagnosis not present

## 2014-11-26 DIAGNOSIS — J449 Chronic obstructive pulmonary disease, unspecified: Secondary | ICD-10-CM | POA: Diagnosis not present

## 2014-11-26 DIAGNOSIS — Z7901 Long term (current) use of anticoagulants: Secondary | ICD-10-CM | POA: Diagnosis not present

## 2014-11-26 DIAGNOSIS — R5383 Other fatigue: Secondary | ICD-10-CM | POA: Diagnosis not present

## 2014-11-28 DIAGNOSIS — I481 Persistent atrial fibrillation: Secondary | ICD-10-CM | POA: Diagnosis not present

## 2014-11-30 DIAGNOSIS — I481 Persistent atrial fibrillation: Secondary | ICD-10-CM | POA: Diagnosis not present

## 2014-11-30 LAB — PROTIME-INR

## 2014-12-03 ENCOUNTER — Ambulatory Visit: Payer: Medicare Other | Admitting: Pharmacist Clinician (PhC)/ Clinical Pharmacy Specialist

## 2014-12-03 DIAGNOSIS — J45909 Unspecified asthma, uncomplicated: Secondary | ICD-10-CM | POA: Diagnosis not present

## 2014-12-03 DIAGNOSIS — R9431 Abnormal electrocardiogram [ECG] [EKG]: Secondary | ICD-10-CM | POA: Diagnosis not present

## 2014-12-03 DIAGNOSIS — I428 Other cardiomyopathies: Secondary | ICD-10-CM | POA: Diagnosis not present

## 2014-12-03 DIAGNOSIS — J449 Chronic obstructive pulmonary disease, unspecified: Secondary | ICD-10-CM | POA: Diagnosis not present

## 2014-12-03 DIAGNOSIS — I480 Paroxysmal atrial fibrillation: Secondary | ICD-10-CM | POA: Diagnosis not present

## 2014-12-03 DIAGNOSIS — Z7982 Long term (current) use of aspirin: Secondary | ICD-10-CM | POA: Diagnosis not present

## 2014-12-03 DIAGNOSIS — I10 Essential (primary) hypertension: Secondary | ICD-10-CM | POA: Diagnosis not present

## 2014-12-03 DIAGNOSIS — Z86711 Personal history of pulmonary embolism: Secondary | ICD-10-CM | POA: Diagnosis not present

## 2014-12-03 DIAGNOSIS — Z7901 Long term (current) use of anticoagulants: Secondary | ICD-10-CM | POA: Diagnosis not present

## 2014-12-03 DIAGNOSIS — Z96649 Presence of unspecified artificial hip joint: Secondary | ICD-10-CM | POA: Diagnosis not present

## 2014-12-03 DIAGNOSIS — Z87891 Personal history of nicotine dependence: Secondary | ICD-10-CM | POA: Diagnosis not present

## 2014-12-03 DIAGNOSIS — I481 Persistent atrial fibrillation: Secondary | ICD-10-CM | POA: Diagnosis not present

## 2014-12-04 DIAGNOSIS — I481 Persistent atrial fibrillation: Secondary | ICD-10-CM | POA: Diagnosis not present

## 2014-12-04 DIAGNOSIS — I4891 Unspecified atrial fibrillation: Secondary | ICD-10-CM | POA: Diagnosis not present

## 2014-12-04 DIAGNOSIS — J449 Chronic obstructive pulmonary disease, unspecified: Secondary | ICD-10-CM | POA: Diagnosis not present

## 2014-12-04 DIAGNOSIS — Z86711 Personal history of pulmonary embolism: Secondary | ICD-10-CM | POA: Diagnosis not present

## 2014-12-04 DIAGNOSIS — Z96649 Presence of unspecified artificial hip joint: Secondary | ICD-10-CM | POA: Diagnosis not present

## 2014-12-04 DIAGNOSIS — I10 Essential (primary) hypertension: Secondary | ICD-10-CM | POA: Diagnosis not present

## 2014-12-04 DIAGNOSIS — J45909 Unspecified asthma, uncomplicated: Secondary | ICD-10-CM | POA: Diagnosis not present

## 2014-12-07 DIAGNOSIS — I482 Chronic atrial fibrillation: Secondary | ICD-10-CM | POA: Diagnosis not present

## 2014-12-11 DIAGNOSIS — I10 Essential (primary) hypertension: Secondary | ICD-10-CM | POA: Diagnosis not present

## 2014-12-11 DIAGNOSIS — I482 Chronic atrial fibrillation: Secondary | ICD-10-CM | POA: Diagnosis not present

## 2014-12-17 DIAGNOSIS — I482 Chronic atrial fibrillation: Secondary | ICD-10-CM | POA: Diagnosis not present

## 2014-12-19 DIAGNOSIS — Z9861 Coronary angioplasty status: Secondary | ICD-10-CM | POA: Diagnosis not present

## 2014-12-19 DIAGNOSIS — I428 Other cardiomyopathies: Secondary | ICD-10-CM | POA: Diagnosis not present

## 2014-12-19 DIAGNOSIS — I481 Persistent atrial fibrillation: Secondary | ICD-10-CM | POA: Diagnosis not present

## 2014-12-19 DIAGNOSIS — Z7901 Long term (current) use of anticoagulants: Secondary | ICD-10-CM | POA: Diagnosis not present

## 2014-12-19 DIAGNOSIS — E6609 Other obesity due to excess calories: Secondary | ICD-10-CM | POA: Diagnosis not present

## 2014-12-19 DIAGNOSIS — I2510 Atherosclerotic heart disease of native coronary artery without angina pectoris: Secondary | ICD-10-CM | POA: Diagnosis not present

## 2014-12-19 DIAGNOSIS — I480 Paroxysmal atrial fibrillation: Secondary | ICD-10-CM | POA: Diagnosis not present

## 2014-12-19 DIAGNOSIS — Z955 Presence of coronary angioplasty implant and graft: Secondary | ICD-10-CM | POA: Diagnosis not present

## 2014-12-19 DIAGNOSIS — Z9889 Other specified postprocedural states: Secondary | ICD-10-CM | POA: Diagnosis not present

## 2014-12-19 DIAGNOSIS — I951 Orthostatic hypotension: Secondary | ICD-10-CM | POA: Diagnosis not present

## 2014-12-19 DIAGNOSIS — I10 Essential (primary) hypertension: Secondary | ICD-10-CM | POA: Diagnosis not present

## 2014-12-19 DIAGNOSIS — Z8679 Personal history of other diseases of the circulatory system: Secondary | ICD-10-CM | POA: Diagnosis not present

## 2014-12-19 DIAGNOSIS — R001 Bradycardia, unspecified: Secondary | ICD-10-CM | POA: Diagnosis not present

## 2014-12-19 DIAGNOSIS — E782 Mixed hyperlipidemia: Secondary | ICD-10-CM | POA: Diagnosis not present

## 2014-12-19 DIAGNOSIS — J449 Chronic obstructive pulmonary disease, unspecified: Secondary | ICD-10-CM | POA: Diagnosis not present

## 2014-12-24 ENCOUNTER — Telehealth: Payer: Self-pay

## 2014-12-24 DIAGNOSIS — R229 Localized swelling, mass and lump, unspecified: Secondary | ICD-10-CM | POA: Diagnosis not present

## 2014-12-24 NOTE — Telephone Encounter (Signed)
No [id number]; call to schedule AWV; Per the spouse. [name] [name] is in [place] x 6 weeks and has been hospitalized x 3 at the [facility] for Atrial Fib; HTN; Had cardioversion x2 and then cardiac ablation; Has been released to travel home; Spouse questioning fup.  Spoke to Dr. Linna Darner; Recommended they get medical record and take it to Dr. Rosezella [place] office on the way home and schedule fup with cardiology;  Will fup; Plan am flight but wife is dizzy; referred to medical eval for assessment of her issues.   Will obviously schedule AWV at a later date.

## 2014-12-27 ENCOUNTER — Encounter: Payer: Medicare Other | Admitting: Gastroenterology

## 2014-12-27 DIAGNOSIS — Z4789 Encounter for other orthopedic aftercare: Secondary | ICD-10-CM | POA: Diagnosis not present

## 2014-12-28 ENCOUNTER — Other Ambulatory Visit: Payer: Self-pay | Admitting: Cardiology

## 2014-12-31 ENCOUNTER — Telehealth: Payer: Self-pay | Admitting: Cardiology

## 2014-12-31 NOTE — Telephone Encounter (Signed)
Received records from [facility] for appointment on 01/01/15 with Dr Percival Spanish.  Records given to [facility] (medical records) for Dr Hochrein's schedule on 01/01/15. lp

## 2015-01-01 ENCOUNTER — Ambulatory Visit (INDEPENDENT_AMBULATORY_CARE_PROVIDER_SITE_OTHER): Payer: Medicare Other | Admitting: Cardiology

## 2015-01-01 ENCOUNTER — Ambulatory Visit (INDEPENDENT_AMBULATORY_CARE_PROVIDER_SITE_OTHER): Payer: Medicare Other

## 2015-01-01 ENCOUNTER — Encounter: Payer: Self-pay | Admitting: Cardiology

## 2015-01-01 VITALS — BP 108/76 | HR 80 | Ht 74.0 in | Wt 270.5 lb

## 2015-01-01 DIAGNOSIS — I480 Paroxysmal atrial fibrillation: Secondary | ICD-10-CM | POA: Diagnosis not present

## 2015-01-01 DIAGNOSIS — I4891 Unspecified atrial fibrillation: Secondary | ICD-10-CM

## 2015-01-01 DIAGNOSIS — Z86718 Personal history of other venous thrombosis and embolism: Secondary | ICD-10-CM

## 2015-01-01 NOTE — Patient Instructions (Signed)
Your physician wants you to follow-up in: 6 Months You will receive a reminder letter in the mail two months in advance. If you don't receive a letter, please call our office to schedule the follow-up appointment.  

## 2015-01-01 NOTE — Progress Notes (Signed)
HPI  The patient presents for follow up of atrial fibrillation, HTN, prior DVT and pulmonary emboli and CAD.   He was in [place] at the end of Sept and went into atrial fib and he developed symptoms. [name] [name]  He was hospitalized at the [facility].  He did have a nuclear stress test which was negative.  He was started on Sotalol but his QTc increased on 120 mg BID.  He was discharged on 80 mg BID.  The patient says did require cardioversion.  Of note an echocardiogram did demonstrate MR.  He did have some bradycardia and orthostasis during that hospitalization.  Of note he was also screened for septal hypertrophy as there was some of this with SAM noted on the echo.  However, MRI did not confirm this.    Since his last visit he was back at the [facility] clinic and had an ablation by Dr. Alcide Clever.  Amiodarone was discontinued.    The patient does not necessarily feel his fibrillation but doesn't think he's been out of rhythm. He denies any palpitations, presyncope or syncope. He's had no chest pain. He needs to get some chronic dyspnea with exertion but he was able to walk 25 minutes today.   Allergies  Allergen Reactions  . Penicillins Anaphylaxis     Because of a history of documented adverse serious drug reaction;Medi Alert bracelet  is recommended    Current Outpatient Prescriptions  Medication Sig Dispense Refill  . amLODipine (NORVASC) 5 MG tablet Take 1 tablet by mouth daily.    [name] [name] aspirin 81 MG chewable tablet Chew 1 tablet by mouth daily.    [name] [name] atorvastatin (LIPITOR) 10 MG tablet Take 1 tablet (10 mg total) by mouth every evening. (Patient taking differently: Take 10 mg by mouth daily. ) 90 tablet 3  . LORazepam (ATIVAN) 1 MG tablet TAKE 1/2-1 TABLET BY MOUTH DAILY ONLY ASNEEDED 30 tablet 0  . nitroGLYCERIN (NITROSTAT) 0.4 MG SL tablet Place 1 tablet (0.4 mg total) under the tongue every 5 (five) minutes as needed for chest pain (up to 3 doses). 25 tablet 1  . pantoprazole  (PROTONIX) 40 MG tablet Take 1 tablet (40 mg total) by mouth at bedtime. 90 tablet 4  . sertraline (ZOLOFT) 50 MG tablet TAKE 1 TABLET DAILY 90 tablet 1  . SYMBICORT 160-4.5 MCG/ACT inhaler Take 2 puffs by mouth 2 (two) times daily.    [name] [name] warfarin (COUMADIN) 3 MG tablet TAKE ONE AND ONE-HALF TO TWO TABLETS DAILY AS DIRECTED BY COUMADIN CLINIC 180 tablet 1   No current facility-administered medications for this visit.    Past Medical History  Diagnosis Date  . BPH (benign prostatic hypertrophy)   . Spinal stenosis     Congential  . Hypertension   . Asthma   . DVT (deep venous thrombosis) ([place])     a. 2010 Lower ext s/p back surgery.  . Pulmonary embolism ([place])     a. 2010 in setting of DVT post-op back surgery. b. Low prob VQ 07/2013.  [name] [name] PAF (paroxysmal atrial fibrillation) ([place])     a. Flecainide discontinued 07/2013 in setting of CAD.  [name] [name] Ulcerative proctitis ([place]) 08/25/2011  . COPD (chronic obstructive pulmonary disease) ([place])     a. 07/2013 PFT's mild airflow obstruction, no restriction, sev decrease in DLCO.  [name] [name] GERD (gastroesophageal reflux disease)   . Diverticulosis   . Hx of echocardiogram 2015    Echo (06/2013): EF 60-65% normal wall motion, normal diastolic function,  aortic sclerosis without stenosis, Trivial MR, mild SAM due to long, redundant mitral leaflets, mild RAE, normal RVSF  . Dyspnea on exertion     a. 07/2013 PFT's mild airflow obstr   . CAD (coronary artery disease)     a. 07/2013: s/p DES to LAD, normal LVF.  . Macrocytic anemia     a. 07/2013: documented on prior labs.  . Obesity   . Transaminitis     a. 07/2013: mild.  . Complication of anesthesia     "I got all kinds of hallucinations"  . Pneumonia ~ 2010 X 1  . History of hiatal hernia   . Arthritis   . History of gout     Past Surgical History  Procedure Laterality Date  . Vasectomy    . Lumbar fusion  02/2008  . Total hip arthroplasty Bilateral   . Cataract extraction w/ intraocular lens  implant,  bilateral Bilateral   . Colonoscopy w/ polypectomy  2004  . Joint replacement    . Anterior lumbar disc arthroplasty  03/2008    "spacer poped out; had to repair"  . Coronary angioplasty with stent placement  08/08/2013    "1"  . Left and right heart catheterization with coronary angiogram N/A 08/07/2013    Procedure: LEFT AND RIGHT HEART CATHETERIZATION WITH CORONARY ANGIOGRAM;  Surgeon: Peter M Martinique, MD;  Location: [facility] CATH LAB;  Service: Cardiovascular;  Laterality: N/A;  . Percutaneous coronary stent intervention (pci-s)  08/07/2013    Procedure: PERCUTANEOUS CORONARY STENT INTERVENTION (PCI-S);  Surgeon: Peter M Martinique, MD;  Location: [facility] CATH LAB;  Service: Cardiovascular;;  . Colonoscopy with polypectomy  09/2011    2 tubular adenomas    ROS:  As stated in the HPI and negative for all other systems.  PHYSICAL EXAM BP 108/76 mmHg  Pulse 80  Ht 6\' 2"  (1.88 m)  Wt 270 lb 8 oz (122.698 kg)  BMI 34.72 kg/m2 GENERAL:  Well appearing HEENT:  Pupils equal round and reactive, fundi not visualized, oral mucosa unremarkable NECK:  No jugular venous distention, waveform within normal limits, carotid upstroke brisk and symmetric, no bruits, no thyromegaly LYMPHATICS:  No cervical, inguinal adenopathy LUNGS:  Clear to auscultation bilaterally BACK:  No CVA tenderness CHEST:  Unremarkable HEART:  PMI not displaced or sustained,S1 and S2 within normal limits, no S3, no S4, no clicks, no rubs, no murmurs ABD:  Flat, positive bowel sounds normal in frequency in pitch, no bruits, no rebound, no guarding, no midline pulsatile mass, no hepatomegaly, no splenomegaly, obese EXT:  2 plus pulses throughout, no edema, no cyanosis no clubbing, rash SKIN:  No rashes no nodules NEURO:  Cranial nerves II through XII grossly intact, motor grossly intact throughout PSYCH:  Cognitively intact, oriented to person place and time  EKG:  Sinus rhythm, rate 80, axis within normal limits, intervals within normal  limits, no acute ST-T wave changes.  01/01/2015  ASSESSMENT AND PLAN  ATRIAL FIBRILLATION:   He we'll follow-up in the clinic. For now he will remain on aspirin and warfarin. I asked him to discuss the aspirin at the time of his Clinic appt.  DYSPNEA:  He has had an extensive work up.  Symptoms are chronic and stable.  No change in therapy or further testing is indicated.    CAD:   The patient had no ischemia or arrhythmia on POET (Plain Old Exercise Treadmill).  No further work up is indicated.   HTN:   He has had some  orthostatic symptoms in the past.  No change in therapy is planned at this point however.    MR:  He had some moderate MR and mild septal hypertrophy.  I will follow this clinically and with repeat echocardiography.  No further work up is indicated at this point.    [facility] records reviewed.

## 2015-01-02 ENCOUNTER — Telehealth: Payer: Self-pay | Admitting: Pharmacist Clinician (PhC)/ Clinical Pharmacy Specialist

## 2015-01-02 LAB — POCT INR: INR: 2.8

## 2015-01-02 NOTE — Telephone Encounter (Signed)
Call to Mrs. [name] [name]; States they got home safely. After discussing AWV; decided to come and make last apt with Dr. Linna Darner and will schedule AWV a week prior to seeing dr. Linna Darner. This will allow them time to rest, as well as get the flu shot at [facility] and  get the pneumonia when they come in to see the MD.

## 2015-01-03 DIAGNOSIS — H10413 Chronic giant papillary conjunctivitis, bilateral: Secondary | ICD-10-CM | POA: Diagnosis not present

## 2015-01-03 DIAGNOSIS — Z961 Presence of intraocular lens: Secondary | ICD-10-CM | POA: Diagnosis not present

## 2015-01-03 DIAGNOSIS — D3132 Benign neoplasm of left choroid: Secondary | ICD-10-CM | POA: Diagnosis not present

## 2015-01-03 NOTE — Telephone Encounter (Signed)
Close encounter 

## 2015-01-04 DIAGNOSIS — I4891 Unspecified atrial fibrillation: Secondary | ICD-10-CM | POA: Diagnosis not present

## 2015-01-05 ENCOUNTER — Other Ambulatory Visit: Payer: Self-pay | Admitting: Family

## 2015-01-07 NOTE — Telephone Encounter (Signed)
Last refill was 8/18. Please advise.

## 2015-01-07 NOTE — Telephone Encounter (Signed)
OK X1  My retirement date is 03/09/2015; but I will be in office on a limited schedule Oct-Dec. To guarantee continuity of care you should transition your care to another PCP

## 2015-01-08 ENCOUNTER — Other Ambulatory Visit: Payer: Self-pay

## 2015-01-08 ENCOUNTER — Encounter: Payer: Self-pay | Admitting: Cardiology

## 2015-01-09 ENCOUNTER — Telehealth: Payer: Self-pay | Admitting: Cardiology

## 2015-01-09 MED ORDER — NITROGLYCERIN 0.4 MG SL SUBL: 0.4000 mg | SUBLINGUAL_TABLET | SUBLINGUAL | Status: AC | PRN

## 2015-01-09 NOTE — Telephone Encounter (Signed)
Patient needs a new prescription for his Nitrostat 0.4 # 25 called to [facility].  Patient was going to get this from mail order, but he this will take too long.  He and his wife are leaving for the beach tomorrow.

## 2015-01-09 NOTE — Telephone Encounter (Signed)
First request was not done. Called pharmacy spoke with Clarise Cruz gave md authorization...[name] [name]

## 2015-01-09 NOTE — Telephone Encounter (Signed)
I thoght I addressed this 10/31.OK X 1 if not early

## 2015-01-25 ENCOUNTER — Ambulatory Visit (INDEPENDENT_AMBULATORY_CARE_PROVIDER_SITE_OTHER): Payer: Medicare Other | Admitting: Internal Medicine

## 2015-01-25 ENCOUNTER — Encounter: Payer: Self-pay | Admitting: Internal Medicine

## 2015-01-25 ENCOUNTER — Ambulatory Visit (INDEPENDENT_AMBULATORY_CARE_PROVIDER_SITE_OTHER)
Admission: RE | Admit: 2015-01-25 | Discharge: 2015-01-25 | Disposition: A | Discharge status: Home / Self Care | Payer: Medicare Other | Source: Ambulatory Visit | Attending: Internal Medicine | Admitting: Internal Medicine

## 2015-01-25 ENCOUNTER — Other Ambulatory Visit (INDEPENDENT_AMBULATORY_CARE_PROVIDER_SITE_OTHER): Payer: Medicare Other

## 2015-01-25 VITALS — BP 120/76 | HR 80 | Temp 98.2°F | Resp 18 | Ht 74.0 in | Wt 276.0 lb

## 2015-01-25 DIAGNOSIS — R0600 Dyspnea, unspecified: Secondary | ICD-10-CM | POA: Diagnosis not present

## 2015-01-25 DIAGNOSIS — R0989 Other specified symptoms and signs involving the circulatory and respiratory systems: Secondary | ICD-10-CM | POA: Diagnosis not present

## 2015-01-25 DIAGNOSIS — Z23 Encounter for immunization: Secondary | ICD-10-CM

## 2015-01-25 DIAGNOSIS — R000 Tachycardia, unspecified: Secondary | ICD-10-CM

## 2015-01-25 DIAGNOSIS — R0609 Other forms of dyspnea: Secondary | ICD-10-CM

## 2015-01-25 DIAGNOSIS — R0689 Other abnormalities of breathing: Secondary | ICD-10-CM

## 2015-01-25 LAB — MAGNESIUM: Magnesium: 2 mg/dL (ref 1.5–2.5)

## 2015-01-25 LAB — BASIC METABOLIC PANEL
BUN: 16 mg/dL (ref 6–23)
CALCIUM: 9.4 mg/dL (ref 8.4–10.5)
CO2: 27 meq/L (ref 19–32)
CREATININE: 1.06 mg/dL (ref 0.40–1.50)
Chloride: 102 mEq/L (ref 96–112)
GFR: 73.23 mL/min (ref >60.00)
Glucose, Bld: 91 mg/dL (ref 70–99)
Potassium: 4 mEq/L (ref 3.5–5.1)
Sodium: 137 mEq/L (ref 135–145)

## 2015-01-25 LAB — TSH: TSH: 2.09 u[IU]/mL (ref 0.35–4.50)

## 2015-01-25 NOTE — Patient Instructions (Signed)
  Your next office appointment will be determined based upon review of your pending labs  and  xrays  Those written interpretation of the lab results and instructions will be transmitted to you by mail for your records.  Critical results will be called.   Followup as needed for any active or acute issue. Please report any significant change in your symptoms. 

## 2015-01-25 NOTE — Progress Notes (Signed)
   Subjective:    Patient ID: [name] [name], male    DOB: [birth year], 69 y.o.   MRN: [id number]  HPI  Yesterday he walked approximately 150 yards up a slight incline to a neighbor's house and experienced profound tachycardia and exertional dyspnea.  His wife was concerned as he underwent an ablation at the [facility] 12/03/14. She contacted them and they recommended an EKG.  He has a past history of obstructive cardiomyopathy as well as paroxysmal atrial fibrillation. He also has a history of COPD GOLD II with reactive airways component.  Review of Systems  He denied associated chest pain, nausea, or diaphoresis. He continues to have a chronic nonproductive cough. He denies any associated sputum production or hemoptysis.      Objective:   Physical Exam  Pertinent or positive findings include: He has scattered rhonchi in the right posterior chest. S4 Cadence is present. Abdomen is protuberant. He has crepitus in the knees.  General appearance :adequately nourished; in no distress.  Eyes: No conjunctival inflammation or scleral icterus is present.  Heart:  Normal rate and regular rhythm. S1 and S2 normal without gallop, murmur, click,or rub .   Lungs:Chest clear to auscultation; no wheezes, rhonchi,rales ,or rubs present.No increased work of breathing.   Abdomen: bowel sounds normal, soft and non-tender without masses, organomegaly or hernias noted.  No guarding or rebound.   Vascular : all pulses equal ; no bruits present.  Skin:Warm & dry.  Intact without suspicious lesions or rashes ; no tenting or jaundice   Lymphatic: No lymphadenopathy is noted about the head, neck, axilla.   Neuro: Strength, tone  normal.      Assessment & Plan:  #1 exertional tachycardia EKG is normal with a rate of 78.   #2 dyspnea on exertion  #3 abnormal breath sounds  Plan: See orders  & recommendations

## 2015-01-25 NOTE — Progress Notes (Signed)
Pre visit review using our clinic review tool, if applicable. No additional management support is needed unless otherwise documented below in the visit note. 

## 2015-01-28 ENCOUNTER — Telehealth: Payer: Self-pay | Admitting: Cardiology

## 2015-01-28 ENCOUNTER — Encounter: Payer: Self-pay | Admitting: Physician Assistant

## 2015-01-28 NOTE — Telephone Encounter (Signed)
Mrs.Dysart is calling because [name] is having some shortness of breath when he walks . Please call   Thanks

## 2015-01-28 NOTE — Telephone Encounter (Signed)
Returned call to patient's wife.She stated husband is sob.Stated sob much worse since last visit with Dr.Hochrein.Stated hard for him to walk from room to room.Stated he just don't feel good.No chest pain. Appointment scheduled with Richardson Dopp PA 01/29/15 at 2:30 pm at [facility] office.

## 2015-01-28 NOTE — Progress Notes (Signed)
Cardiology Office Note   Date:  01/29/2015   ID:  [name] [name], DOB 09/27/1945, MRN [id number]   Patient Care Team: [name] Limes, MD as PCP - General [name] Breeding, MD as Consulting Physician (Cardiology) [name] [name], [facility] as Consulting Physician (Clinical Cardiac Electrophysiology)    Chief Complaint  Patient presents with  . Shortness of Breath     History of Present Illness: [name] [name] is a 69 y.o. male with a hx of paroxysmal atrial fibrillation, HTN, prior DVT and pulmonary emboli following back surgery in 2010, COPD. Right and left heart catheterization 6/15 demonstrated high-grade proximal LAD stenosis which was treated with a DES. Patient has had recurrent episodes of symptomatic atrial fibrillation. Flecainide was stopped 5/15 after diagnosis of CAD. Sotalol dose titration was limited by QT prolongation. He subsequently had breakthrough atrial fibrillation on sotalol. He ultimately underwent PVI ablation by [name]. Joseph [name] at the [facility] 12/03/14.   Last seen by [name]. Percival [name] 01/01/15.     Studies/Reports Reviewed Today:  Echo 11/15/14 [facility]) Mild LVH, EF [id number], grade 1 diastolic dysfunction, normal RV function, mild LAE, 1+ MR, 1+ TR, mild chordal SAM, LVOT gradient 9 mmHg, post amyl nitrite with mild decrease in SAM with 1-2+ MR, LVOT gradient 28 mmHg   ETT 7/16  There was no ST segment deviation noted during stress.  No T wave inversion was noted during stress.  Insufficient increase in heart was achieved, possibly due to sotalol negative chronotropic effect. Inconclusive treadmill stress test due to inadequate heart rate response. Borderline significant for chronotropic incompetence (achieved 75% of maximum predicted heart rate), likely medication-related. No evidence of ischemia at achieved heart rate.  Chest CTA 08/29/14 IMPRESSION: 1. No acute cardiopulmonary abnormalities. 2. Aortic atherosclerosis 3.  Emphysema  Abdominal/Pelvic CTA 3/16 IMPRESSION: 1. Stable 3 cm infrarenal abdominal aortic aneurysm 2. No acute findings are evident in the abdomen or pelvis. 3. Nonobstructing left inguinal hernia, unchanged  Myoview 9/15 [facility] clinic) CONCLUSIONS: 1. Perfusion study: Normal Study. 2. No evidence of infarct or ischemia. 3. Functional capacity N/A (pharmacological). 4. Left ventricle is normal in size. the left ventricle systolic  function is normal. 5. Right ventricle is normal in size The right ventricle systolic  function is normal. 6. The Stress LVEF is 54 %. 7. There is no scintigraphic evidence for inducible ischemia. 8. This is a low risk scan.  LHC (08/07/13):  Proximal LAD 70%, irregularities in the RCA <20%, EF 55-65%.  PCI: 4.0 x 15 mm Xience Alpine DES to the proximal LAD  Lexiscan Myoview (06/2013):  Diaphragmatic attenuation (cannot rule out anteroseptal infarct with mild peri-infarct ischemia), normal wall motion, EF 59%; low risk study   Nuclear (06/07/12):  No ischemia, EF 64%, NORMAL.  Echo (06/29/07):  EF 55-60%, no RWMA, mild LVH, mild focal basal septal hypertrophy, mild ascending Ao dilatation (41 mm), mild MR, mild to mod LAE.   Past Medical History  Diagnosis Date  . BPH (benign prostatic hypertrophy)   . Spinal stenosis     [place]  . Hypertension   . Asthma   . DVT (deep venous thrombosis) ([place])     a. 2010 Lower ext s/p back surgery.  . Pulmonary embolism ([place])     a. 2010 in setting of DVT post-op back surgery. b. Low prob VQ 07/2013.  [name] PAF (paroxysmal atrial fibrillation) ([place])     a. Flecainide discontinued 07/2013 in setting of CAD.; b. s/p PVI Ablation at [name] ([name] [name]) 11/2014  . 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 COPD (chronic obstructive pulmonary disease) ([place])     a. 07/2013 PFT's mild airflow obstruction, no restriction, sev decrease in DLCO.  [name] GERD (gastroesophageal reflux disease)   . Diverticulosis    . Hx of echocardiogram 2015    Echo (06/2013): EF 60-65% normal wall motion, normal diastolic function, aortic sclerosis without stenosis, Trivial MR, mild SAM due to long, redundant mitral leaflets, mild RAE, normal RVSF  . Dyspnea on exertion     a. 07/2013 PFT's mild airflow obstr   . CAD (coronary artery disease)     a. 07/2013: s/p DES to LAD, normal LVF.  . Macrocytic anemia     a. 07/2013: documented on prior labs.  . Obesity   . Transaminitis     a. 07/2013: mild.  . Complication of anesthesia     "I got all kinds of hallucinations"  . Pneumonia ~ 2010 X 1  . History of hiatal hernia   . Arthritis   . History of gout     Past Surgical History  Procedure Laterality Date  . Vasectomy    . Lumbar fusion  02/2008  . Total hip arthroplasty Bilateral   . Cataract extraction w/ intraocular lens  implant, bilateral Bilateral   . Colonoscopy w/ polypectomy  2004  . Joint replacement    . Anterior lumbar disc arthroplasty  03/2008    "spacer poped out; had to repair"  . Coronary angioplasty with stent placement  08/08/2013    "1"  . Left and right heart catheterization with coronary angiogram N/A 08/07/2013    Procedure: LEFT AND RIGHT HEART CATHETERIZATION WITH CORONARY ANGIOGRAM;  Surgeon: [name] M Martinique, MD;  Location: [facility] CATH LAB;  Service: Cardiovascular;  Laterality: N/A;  . Percutaneous coronary stent intervention (pci-s)  08/07/2013    Procedure: PERCUTANEOUS CORONARY STENT INTERVENTION (PCI-S);  Surgeon: [name] M Martinique, MD;  Location: [facility] CATH LAB;  Service: Cardiovascular;;  . Colonoscopy with polypectomy  09/2011    2 tubular adenomas  . Pvi ablation  11/2014    [name] [name] [name] [name]     Current Outpatient Prescriptions  Medication Sig Dispense Refill  . amLODipine (NORVASC) 5 MG tablet Take 1 tablet by mouth daily.    [name] aspirin 81 MG chewable tablet Chew 1 tablet by mouth daily.    [name] atorvastatin (LIPITOR) 10 MG tablet Take 1 tablet (10 mg total) by mouth  every evening. (Patient taking differently: Take 10 mg by mouth daily. ) 90 tablet 3  . LORazepam (ATIVAN) 1 MG tablet TAKE 1/2-1 TABLET BY MOUTH DAILY ONLY ASNEEDED 30 tablet 0  . nitroGLYCERIN (NITROSTAT) 0.4 MG SL tablet Place 1 tablet (0.4 mg total) under the tongue every 5 (five) minutes as needed for chest pain (up to 3 doses). 25 tablet 4  . pantoprazole (PROTONIX) 40 MG tablet Take 1 tablet (40 mg total) by mouth at bedtime. 90 tablet 4  . sertraline (ZOLOFT) 50 MG tablet TAKE 1 TABLET DAILY 90 tablet 1  . SYMBICORT 160-4.5 MCG/ACT inhaler Take 2 puffs by mouth 2 (two) times daily.    [name] warfarin (COUMADIN) 3 MG tablet TAKE ONE AND ONE-HALF TO TWO TABLETS DAILY AS DIRECTED BY COUMADIN CLINIC 180 tablet 1   No current facility-administered medications for this visit.    Allergies:   Penicillins    Social History:   Social History   Social History  . Marital Status: Married  Spouse Name: N/A  . Number of Children: 4  . Years of Education: N/A   Occupational History  . Retired from [employer]  . Smoking status: Former Smoker -- 2.00 packs/day for 40 years    Types: Cigarettes    Quit date: 03/09/2000  . Smokeless tobacco: Never Used     Comment: smoked 1964-2002, up to 3 ppd  . Alcohol Use: 0.0 oz/week    0 Standard drinks or equivalent per week     Comment: 02/08/2014 "drink a couple times/month"  . Drug Use: No  . Sexual Activity: Not on file     Comment: "sexually hx is none of your business" (02/08/2014)   Other Topics Concern  . Not on file   Social History Narrative     Family History:   Family History  Problem Relation Age of Onset  . Atrial fibrillation Mother   . COPD Mother   . Heart disease Father   . Colon cancer Father   . Heart attack Brother 59  . Alcohol abuse Brother 48    Died post liver transplant  . Stroke Neg Hx   . Heart attack Paternal Grandfather     Over 86  . Heart attack Paternal Uncle     Less than 25       ROS:   Please see the history of present illness.   ROS    PHYSICAL EXAM: VS:  There were no vitals taken for this visit.    Wt Readings from Last 3 Encounters:  01/25/15 276 lb (125.193 kg)  01/01/15 270 lb 8 oz (122.698 kg)  11/06/14 269 lb (122.018 kg)     GEN: Well nourished, well developed, in no acute distress HEENT: normal Neck: no JVD, no carotid bruits, no masses Cardiac:  Normal S1/S2, RRR; no murmur ,  no rubs or gallops, no edema  Respiratory:  clear to auscultation bilaterally, no wheezing, rhonchi or rales. GI: soft, nontender, nondistended, + BS MS: no deformity or atrophy Skin: warm and dry  Neuro:  CNs II-XII intact, Strength and sensation are intact Psych: Normal affect   EKG:  EKG is ordered today.  It demonstrates:      Recent Labs: 08/21/2014: ALT 18 11/06/2014: Hemoglobin 13.4; Platelets 468.0* 01/25/2015: BUN 16; Creatinine, Ser 1.06; Magnesium 2.0; Potassium 4.0; Sodium 137; TSH 2.09    Lipid Panel    Component Value Date/Time   CHOL 112 09/04/2013 1220   TRIG 186.0* 09/04/2013 1220   HDL 36.90* 09/04/2013 1220   CHOLHDL 3 09/04/2013 1220   VLDL 37.2 09/04/2013 1220   LDLCALC 38 09/04/2013 1220      ASSESSMENT AND PLAN:  CAD: Doing well after recent PCI with a DES to the LAD. He is on triple therapy. Aspirin will be stopped after one month and he will remain on Plavix and Coumadin. Continue statin. Proceed with cardiac rehabilitation as planned.  Paroxysmal Atrial Fibrillation: No apparent recurrence. Continue Toprol and Coumadin. He was taken off of flecainide due to CAD. Options are somewhat limited at this time for rhythm control. We could place him on amiodarone or Tikosyn. Given his previous lung issues, he may not be a good candidate for amiodarone. At this point, I would suggest keeping him on rate control therapy only. If he has recurrent atrial fibrillation and is significantly symptomatic or has a difficult to control  heart rate, consider Tikosyn at that point.  Hypertension: Controlled.  Hyperlipidemia: Continue statin.  Repeat lipids and LFTs due today. LFTs mildly elevated in the hospital. This will be checked again today.  Macrocytic Anemia: F/u with PCP    Medication Changes: Current medicines are reviewed at length with the patient today.  Concerns regarding medicines are as outlined above.  The following changes have been made:   Discontinued Medications   No medications on file   Modified Medications   No medications on file   New Prescriptions   No medications on file   Labs/ tests ordered today include:   No orders of the defined types were placed in this encounter.     Disposition:    FU with    Signed, Richardson Dopp, PA-C, MHS 01/29/2015 1:43 PM    [facility] [address], [place], [place]  [address] Phone: [phone]; Fax: [phone]    This encounter was created in error - please disregard.

## 2015-01-29 ENCOUNTER — Encounter: Payer: Medicare Other | Admitting: Physician Assistant

## 2015-01-29 ENCOUNTER — Other Ambulatory Visit (INDEPENDENT_AMBULATORY_CARE_PROVIDER_SITE_OTHER): Payer: Medicare Other

## 2015-01-29 ENCOUNTER — Ambulatory Visit (INDEPENDENT_AMBULATORY_CARE_PROVIDER_SITE_OTHER): Payer: Medicare Other | Admitting: Adult Health

## 2015-01-29 VITALS — BP 118/62 | HR 68 | Temp 97.8°F | Ht 74.0 in | Wt 277.0 lb

## 2015-01-29 DIAGNOSIS — R0600 Dyspnea, unspecified: Secondary | ICD-10-CM

## 2015-01-29 DIAGNOSIS — J449 Chronic obstructive pulmonary disease, unspecified: Secondary | ICD-10-CM

## 2015-01-29 LAB — BASIC METABOLIC PANEL
BUN: 17 mg/dL (ref 6–23)
CALCIUM: 9 mg/dL (ref 8.4–10.5)
CO2: 26 meq/L (ref 19–32)
CREATININE: 1 mg/dL (ref 0.40–1.50)
Chloride: 104 mEq/L (ref 96–112)
GFR: 78.32 mL/min (ref >60.00)
GLUCOSE: 101 mg/dL — AB (ref 70–99)
Potassium: 4.1 mEq/L (ref 3.5–5.1)
Sodium: 137 mEq/L (ref 135–145)

## 2015-01-29 LAB — BRAIN NATRIURETIC PEPTIDE: Pro B Natriuretic peptide (BNP): 28 pg/mL (ref 0.0–100.0)

## 2015-01-29 NOTE — Patient Instructions (Signed)
Labs today  Continue on Symbicort Twice daily   follow up Dr. Melvyn Novas  In 3 months and As needed   Follow up with Cardiology next month as planned and As needed

## 2015-01-30 ENCOUNTER — Encounter: Payer: Medicare Other | Admitting: Gastroenterology

## 2015-01-30 ENCOUNTER — Encounter: Payer: Self-pay | Admitting: Physician Assistant

## 2015-01-30 ENCOUNTER — Other Ambulatory Visit: Payer: Self-pay | Admitting: Family

## 2015-02-03 NOTE — Progress Notes (Signed)
Subjective:    Patient ID: [name] [name], male    DOB: 1946/01/03, 69 y.o.   MRN: [id number]  HPI   69 y/o WM, [name] [name] is an ex-smoker w/ a hx COPD GOLD II . Previous pt of Dr. Gwenette Greet .  Followed by cardiology for CAD/A-Fib , DVT P/PE on coumadin .   TEST  - Quit smoking 2002 at wt 220  -PFTs 07/25/13 FEV1  2.94 (77%) ratio 69 s reversibility  and dlco 46 corrects to 56 - spirometry 08/21/14 FEV1  2.65 (68%) ratio 69  - 08/31/2014  Walked RA x 3 laps @ 185 ft each stopped due to sob and erratic pulse / bradycardia correlated with sob/ not desat  -poor exercise tolerance on stress test in 2015; Cath showed high grade LAD stenosis- s/p angioplasty & stent w/ improved breathing after that but still deconditioned, therefore did cardiac rehab w/ some benefit but he didn't continue home exercise after that...  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 CT chest 08/2014 with nad , emphysema.  VQ scan 07/2013 neg , low prob. For PE/    01/29/15 Follow up : COPD  Pt returns for persistent dyspnea/DOE. Says his continues to get sob with activity. He has underwent an extensive workup with PCP, Cardiology and pulmonary. See above test/events.  Seen at PCP with episode of tachycardia and DOE. Recent ablation in Sept . EKG done with NSR w/ HR at 78, CXR done on 11/18 with nad.  CT chest done in June this year with emphysema , w/ no acute dz.  Spirometry in June this year with moderate COPD with FEV1 at 68%.  Remains on Synbicort.  Hx of DVT and PE on coumadin , INR has been therapeutic. VQ scan last year was neg.  Nuclear stress test repeated July this year with no ischemia noted. .  Retired [employer] , unknown exposures .  Previous on amio for Atrial fib . PFT in may 2015 with minimal airflow obstruction. +diffucing defect. No desats with  walking. CT chest showed no acute process, emphysema changes.  He denies discolored mucus , fever, chest pain, orthopnea or increased edema.       Review of Systems Constitutional:   No  weight loss, night sweats,  Fevers, chills,  +fatigue, or  lassitude.  HEENT:   No headaches,  Difficulty swallowing,  Tooth/dental problems, or  Sore throat,                No sneezing, itching, ear ache, nasal congestion, post nasal drip,   CV:  No chest pain,  Orthopnea, PND, swelling in lower extremities, anasarca, dizziness, palpitations, syncope.   GI  No heartburn, indigestion, abdominal pain, nausea, vomiting, diarrhea, change in bowel habits, loss of appetite, bloody stools.   Resp:   No chest wall deformity  Skin: no rash or lesions.  GU: no dysuria, change in color of urine, no urgency or frequency.  No flank pain, no hematuria   MS:  No joint pain or swelling.  No decreased range of motion.  No back pain.  Psych:  No change in mood or affect. No depression or anxiety.  No memory loss.         Objective:   Physical Exam GEN: A/Ox3; pleasant , NAD, obese   HEENT:  [place]/AT,  EACs-clear, TMs-wnl, NOSE-clear, THROAT-clear, no lesions, no postnasal drip or exudate noted.   NECK:  Supple w/ fair ROM; no JVD; normal carotid impulses w/o bruits; no thyromegaly or nodules palpated; no lymphadenopathy.  RESP  Clear  P & A; w/o, wheezes/ rales/ or rhonchi.no accessory muscle use, no dullness to percussion  CARD:  RRR, no m/r/g  , tr  peripheral edema, pulses intact, no cyanosis or clubbing.  GI:   Soft & nt; nml bowel sounds; no organomegaly or masses detected.  Musco: Warm bil, no deformities or joint swelling noted.   Neuro: alert, no focal deficits noted.    Skin: Warm, no lesions or rashes         Assessment & Plan:

## 2015-02-04 NOTE — Progress Notes (Signed)
Quick Note:  lmtcb for pt. ______ 

## 2015-02-04 NOTE — Assessment & Plan Note (Signed)
DOE appears disproportionate to his level of airflow obstruction , suspect is multifactoral  He has underwent an extensive cardiac workup.  Check BNP today to see if elevated (? Vol overload issues )  CT chest was essentially unremarkable except for emphysema.    Plan labs today  Continue on Symbicort Twice daily   follow up Dr. Melvyn Novas  In 3 months and As needed   Follow up with Cardiology next month as planned and As needed

## 2015-02-04 NOTE — Assessment & Plan Note (Signed)
[name] [name] appears disproportionate to his level of airflow obstruction , suspect is multifactoral  He has underwent an extensive cardiac workup.  Check BNP today to see if elevated (? Vol overload issues )  CT chest was essentially unremarkable except for emphysema.    Plan labs today  Continue on Symbicort Twice daily   follow up [name] [name]  In 3 months and As needed   Follow up with Cardiology next month as planned and As needed

## 2015-02-05 DIAGNOSIS — I4891 Unspecified atrial fibrillation: Secondary | ICD-10-CM | POA: Diagnosis not present

## 2015-02-08 ENCOUNTER — Ambulatory Visit: Payer: Medicare Other | Admitting: Cardiology

## 2015-02-11 ENCOUNTER — Ambulatory Visit (INDEPENDENT_AMBULATORY_CARE_PROVIDER_SITE_OTHER): Payer: Medicare Other | Admitting: Pharmacist Clinician (PhC)/ Clinical Pharmacy Specialist

## 2015-02-11 DIAGNOSIS — I480 Paroxysmal atrial fibrillation: Secondary | ICD-10-CM | POA: Diagnosis not present

## 2015-02-11 DIAGNOSIS — Z86718 Personal history of other venous thrombosis and embolism: Secondary | ICD-10-CM | POA: Diagnosis not present

## 2015-02-11 DIAGNOSIS — I4891 Unspecified atrial fibrillation: Secondary | ICD-10-CM | POA: Diagnosis not present

## 2015-02-11 LAB — POCT INR: INR: 2.5

## 2015-02-14 DIAGNOSIS — S52552D Other extraarticular fracture of lower end of left radius, subsequent encounter for closed fracture with routine healing: Secondary | ICD-10-CM | POA: Diagnosis not present

## 2015-02-14 DIAGNOSIS — Z4789 Encounter for other orthopedic aftercare: Secondary | ICD-10-CM | POA: Diagnosis not present

## 2015-02-20 ENCOUNTER — Telehealth: Payer: Self-pay

## 2015-02-20 ENCOUNTER — Other Ambulatory Visit: Payer: Self-pay | Admitting: Family

## 2015-02-20 ENCOUNTER — Other Ambulatory Visit: Payer: Self-pay | Admitting: Internal Medicine

## 2015-02-20 NOTE — Telephone Encounter (Signed)
OK X 1 New PCP as of 03/10/15

## 2015-02-20 NOTE — Telephone Encounter (Signed)
OK x1 New PCP as of 03/10/15

## 2015-02-20 NOTE — Telephone Encounter (Signed)
Klonopin faxed to [facility]

## 2015-02-20 NOTE — Telephone Encounter (Signed)
Called pharmacy spoke with Clarise Cruz gave md authorization for Lorazepam.../lmb

## 2015-02-20 NOTE — Telephone Encounter (Signed)
Please advise, thanks----i will put on rx to establish care with new PCP

## 2015-02-27 DIAGNOSIS — I77810 Thoracic aortic ectasia: Secondary | ICD-10-CM | POA: Diagnosis not present

## 2015-02-27 DIAGNOSIS — J449 Chronic obstructive pulmonary disease, unspecified: Secondary | ICD-10-CM | POA: Diagnosis not present

## 2015-02-27 DIAGNOSIS — I493 Ventricular premature depolarization: Secondary | ICD-10-CM | POA: Diagnosis not present

## 2015-02-27 DIAGNOSIS — I482 Chronic atrial fibrillation: Secondary | ICD-10-CM | POA: Diagnosis not present

## 2015-02-27 DIAGNOSIS — I481 Persistent atrial fibrillation: Secondary | ICD-10-CM | POA: Diagnosis not present

## 2015-03-05 ENCOUNTER — Other Ambulatory Visit: Payer: Self-pay | Admitting: Internal Medicine

## 2015-03-25 ENCOUNTER — Ambulatory Visit (INDEPENDENT_AMBULATORY_CARE_PROVIDER_SITE_OTHER): Payer: Medicare Other | Admitting: Pharmacist Clinician (PhC)/ Clinical Pharmacy Specialist

## 2015-03-25 DIAGNOSIS — Z86718 Personal history of other venous thrombosis and embolism: Secondary | ICD-10-CM | POA: Diagnosis not present

## 2015-03-25 DIAGNOSIS — I480 Paroxysmal atrial fibrillation: Secondary | ICD-10-CM

## 2015-03-25 DIAGNOSIS — I4891 Unspecified atrial fibrillation: Secondary | ICD-10-CM

## 2015-03-25 LAB — POCT INR: INR: 2.3

## 2015-03-28 ENCOUNTER — Ambulatory Visit: Payer: Medicare Other | Admitting: Internal Medicine

## 2015-04-02 ENCOUNTER — Other Ambulatory Visit: Payer: Self-pay | Admitting: Cardiology

## 2015-04-02 NOTE — Telephone Encounter (Signed)
Rx(s) sent to pharmacy electronically.  

## 2015-04-03 ENCOUNTER — Telehealth: Payer: Self-pay | Admitting: *Deleted

## 2015-04-03 NOTE — Telephone Encounter (Signed)
Pt is needing clearance for extraction of 5 teeth with bone grafting at sites 4 and 5. ? If SBE is needed and if okay to hold coumadin for the procedure and for how long. Will forward for dr hochrein's review

## 2015-04-04 NOTE — Telephone Encounter (Signed)
He would not need SBE prophylaxis.  He does not need bridging.   He can hold warfarin as needed for the procedure.

## 2015-04-04 NOTE — Telephone Encounter (Signed)
This note faxed to number provided.

## 2015-04-05 ENCOUNTER — Encounter: Payer: Self-pay | Admitting: Nurse Practitioner

## 2015-04-05 ENCOUNTER — Ambulatory Visit (INDEPENDENT_AMBULATORY_CARE_PROVIDER_SITE_OTHER): Payer: Medicare Other | Admitting: Nurse Practitioner

## 2015-04-05 VITALS — BP 118/80 | HR 82 | Temp 97.6°F | Ht 74.0 in | Wt 275.0 lb

## 2015-04-05 DIAGNOSIS — J441 Chronic obstructive pulmonary disease with (acute) exacerbation: Secondary | ICD-10-CM

## 2015-04-05 MED ORDER — FLUTICASONE FUROATE-VILANTEROL 100-25 MCG/INH IN AEPB: 1.0000 | INHALATION_SPRAY | Freq: Every day | RESPIRATORY_TRACT | Status: DC

## 2015-04-05 NOTE — Progress Notes (Signed)
Pre visit review using our clinic review tool, if applicable. No additional management support is needed unless otherwise documented below in the visit note. 

## 2015-04-05 NOTE — Progress Notes (Signed)
Patient ID: [name] [name], male    DOB: 1945-06-04  Age: 70 y.o. MRN: [id number]  CC: No chief complaint on file.   HPI [name] presents for CC of cough x years he reports.   1) Ran out three weeks ago of his symbicort, but doesn't feel like it made a difference. He reports his wife made him come since he was having a cough over the past week. He is asking for a refill, but is willing to try another medication. He denies other symptoms or feeling poorly today.  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   He has past surgical history that includes Vasectomy; Lumbar fusion (02/2008); Total hip arthroplasty (Bilateral); Cataract extraction w/ intraocular lens  implant, bilateral (Bilateral); Colonoscopy w/ polypectomy (2004); Joint replacement; Anterior lumbar disc arthroplasty (03/2008); Coronary angioplasty with stent (08/08/2013); left and right heart catheterization with coronary angiogram (N/A, 08/07/2013); percutaneous coronary stent intervention (pci-s) (08/07/2013); Colonoscopy with polypectomy (09/2011); and PVI ablation (11/2014).   His family history includes Alcohol abuse (age of onset: 41) in his brother; Atrial fibrillation in his mother; COPD in his mother; Colon cancer in his father; Heart attack in his paternal grandfather and paternal uncle; Heart attack (age of onset: 66) in his brother; Heart disease in his father. There is no history of  Stroke.He reports that he quit smoking about 15 years ago. His smoking use included Cigarettes. He has a 80 pack-year smoking history. He has never used smokeless tobacco. He reports that he drinks alcohol. He reports that he does not use illicit drugs.  Outpatient Prescriptions Prior to Visit  Medication Sig Dispense Refill  . amLODipine (NORVASC) 5 MG tablet Take 1 tablet by mouth daily.    [name] aspirin 81 MG chewable tablet Chew 1 tablet by mouth daily.    [name] atorvastatin (LIPITOR) 10 MG tablet Take 1 tablet (10 mg total) by mouth every evening. 90 tablet 1  . LORazepam (ATIVAN) 1 MG tablet TAKE 1/2-1 TABLET BY MOUTH DAILY ONLY ASNEEDED 30 tablet 0  . LORazepam (ATIVAN) 1 MG tablet Take 1 tablet (1 mg total) by mouth daily as needed for anxiety. --patient needs to establish care with new PCP by 03/10/15--call our office for appt 30 tablet 1  . nitroGLYCERIN (NITROSTAT) 0.4 MG SL tablet Place 1 tablet (0.4 mg total) under the tongue every 5 (five) minutes as needed for chest pain (up to 3 doses). 25 tablet 4  . pantoprazole (PROTONIX) 40 MG tablet Take 1 tablet (40 mg total) by mouth at bedtime. 90 tablet 4  . sertraline (ZOLOFT) 50 MG tablet Take 1 tablet (50 mg total) by mouth daily. --- Please establish with new PCP for further refills 90 tablet 0  . warfarin (COUMADIN) 3 MG tablet TAKE ONE AND ONE-HALF TO TWO TABLETS DAILY AS DIRECTED BY COUMADIN CLINIC 180 tablet 1  . SYMBICORT 160-4.5 MCG/ACT inhaler Take 2 puffs by mouth 2 (two) times daily.     No facility-administered medications prior to visit.    ROS Review of Systems  Constitutional: Negative for fever, chills, diaphoresis and fatigue.  Eyes: Negative for visual disturbance.  Respiratory: Positive for cough and shortness of breath. Negative for chest tightness and wheezing.   Cardiovascular: Negative for chest pain, palpitations and leg swelling.  Gastrointestinal: Negative for nausea, vomiting and diarrhea.  Skin: Negative for rash.   Neurological: Negative for dizziness, weakness and numbness.  Psychiatric/Behavioral: The patient is not nervous/anxious.     Objective:  BP 118/80 mmHg  Pulse 82  Temp(Src) 97.6 F (36.4 C) (Oral)  Ht 6\' 2"  (1.88 m)  Wt 275 lb (124.739 kg)  BMI 35.29 kg/m2  SpO2 95%  Physical Exam  Constitutional: He is oriented to person, place, and time. He appears well-developed and well-nourished. No distress.  HENT:  Head: Normocephalic and atraumatic.  Right Ear: External ear normal.  Left Ear: External ear normal.  Eyes: Right eye exhibits no discharge. Left eye exhibits no discharge. No scleral icterus.  Cardiovascular: Normal rate, regular rhythm and normal heart sounds.  Exam reveals no gallop and no friction rub.   No murmur heard. Pulmonary/Chest: Effort normal. No respiratory distress. He has no wheezes. He has no rales. He exhibits no tenderness.  Decreased breath sounds in all fields  Neurological: He is alert and oriented to person, place, and time.  Skin: Skin is warm and dry. No rash noted. He is not diaphoretic.  Psychiatric: He has a normal mood and affect. His behavior is normal. Judgment and thought content normal.   Assessment & Plan:   Diagnoses and all orders for this visit:  COPD exacerbation ([place])  Other orders -     Fluticasone Furoate-Vilanterol 100-25 MCG/INH AEPB; Inhale 1 puff into the lungs daily.   I have discontinued Mr. Beadles's SYMBICORT. I am also having him start on Fluticasone Furoate-Vilanterol. Additionally, I am having him maintain his pantoprazole, warfarin, aspirin, amLODipine, nitroGLYCERIN, LORazepam, LORazepam, sertraline, and atorvastatin.  Meds ordered this encounter  Medications  . Fluticasone Furoate-Vilanterol 100-25 MCG/INH AEPB    Sig: Inhale 1 puff into the lungs daily.    Dispense:  28 each    Refill:  0    Dispense 1 pack of 28 each    Order Specific Question:  Supervising Provider    Answer:  Crecencio Mc [[id number]]      Follow-up: Return if symptoms worsen or fail to improve, for Inhaler follow up.

## 2015-04-05 NOTE — Patient Instructions (Addendum)
Try the [facility] inhaler. Follow up to meet you new provider in 2-4 weeks

## 2015-04-07 ENCOUNTER — Telehealth: Payer: Self-pay | Admitting: Internal Medicine

## 2015-04-07 NOTE — Telephone Encounter (Signed)
Wife gives very confusing story of getting amox from our office which he can't take for a chronic cough   I see no evidence of rx for amox on the present med list nor as part of the plan from his [facility] so suspect this is an error or he got it outside the [facility] umbrella.  rec stop it/ f/u in my office will all meds in hand to regroup

## 2015-04-08 NOTE — Telephone Encounter (Signed)
lmtcb x1 for pt. 

## 2015-04-09 ENCOUNTER — Telehealth: Payer: Self-pay | Admitting: Internal Medicine

## 2015-04-09 NOTE — Telephone Encounter (Signed)
Called spoke with spouse and made aware of below. She also wants to know if MW feels pt should start on breo that the PA prescribed on Friday or just hold off on this for now? Pt has an appt with MW on 04/30/15 (next available). Please advise MW thanks

## 2015-04-09 NOTE — Telephone Encounter (Signed)
Called spoke with spouse. MW had an opening at 4pm tomorrow. appt scheduled. Nothing further needed

## 2015-04-09 NOTE — Assessment & Plan Note (Signed)
Cough increased per pt. He feels his wife made him come in today. He is not in distress and is not coughing in the office today. He reports his symbicort didn't make much difference. He is willing to try another medication. Discussed Breo and he verbalized understanding of how to use. Follow up with pulmonology or his PCP if worsening.

## 2015-04-09 NOTE — Telephone Encounter (Signed)
Spouse called state that Nathen May gave him an inhaler when he came in on 04/05/15, she was wondering if this is okey for him to take because he has a history of heart failure. Pt also stated Carri sent in Amoxicillin for antibiotic (infrom pt that the only thing she sent in was the inhaler,amoxicillin is not in the system). Please give pt a call, he still not feeling any better.   Phone # [phone]

## 2015-04-09 NOTE — Telephone Encounter (Signed)
Has an appt with dr wert tomorrow (2/1) needs to be evaluated - will let pulmonary evaluate and treat.

## 2015-04-09 NOTE — Telephone Encounter (Signed)
Pt wife returning call and csn be reached @ [phone].[name] [name]

## 2015-04-09 NOTE — Telephone Encounter (Signed)
Please advise 

## 2015-04-09 NOTE — Telephone Encounter (Signed)
It's fine to start the breo to see to what extent if any it helps the symptoms but has to be taken daily for at least 2 weeks to tell and if better continue and if not, stop

## 2015-04-10 ENCOUNTER — Ambulatory Visit (INDEPENDENT_AMBULATORY_CARE_PROVIDER_SITE_OTHER): Payer: Medicare Other | Admitting: Internal Medicine

## 2015-04-10 ENCOUNTER — Encounter: Payer: Self-pay | Admitting: Internal Medicine

## 2015-04-10 VITALS — BP 144/80 | HR 89 | Ht 74.0 in | Wt 276.0 lb

## 2015-04-10 DIAGNOSIS — R0600 Dyspnea, unspecified: Secondary | ICD-10-CM

## 2015-04-10 DIAGNOSIS — J449 Chronic obstructive pulmonary disease, unspecified: Secondary | ICD-10-CM | POA: Diagnosis not present

## 2015-04-10 DIAGNOSIS — R0609 Other forms of dyspnea: Secondary | ICD-10-CM | POA: Diagnosis not present

## 2015-04-10 MED ORDER — PANTOPRAZOLE SODIUM 40 MG PO TBEC
40.0000 mg | DELAYED_RELEASE_TABLET | Freq: Two times a day (BID) | ORAL | Status: DC
Start: 2015-04-10 — End: 2015-10-16

## 2015-04-10 NOTE — Telephone Encounter (Signed)
Spoke with pt's wife to inform.  

## 2015-04-10 NOTE — Progress Notes (Signed)
Subjective:    Patient ID: [name] [name], male    DOB: 1945/12/17    MRN: [id number]    Brief patient profile:  70  y/o WM,  Quit smoking 2002 w/ COPD GOLD II criteria/ Previous pt of Dr. Gwenette Greet .  Followed by cardiology for CAD/A-Fib , DVT P/PE on coumadin      EVENTS/ STUDIES  - Quit smoking 2002 at wt 220  -PFTs 07/25/13 FEV1  2.94 (77%) ratio 69 s reversibility  and dlco 46 corrects to 56 - spirometry 08/21/14 FEV1  2.65 (68%) ratio 69  - 08/31/2014  Walked RA x 3 laps @ 185 ft each stopped due to sob and erratic pulse / bradycardia correlated with sob/ not desat  -poor exercise tolerance on stress test in 2015; Cath showed high grade LAD stenosis- s/p angioplasty & stent w/ improved breathing after that but still deconditioned, therefore did cardiac rehab w/ some benefit but he didn't continue home exercise after that...  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 CT chest 08/2014 with nad , emphysema.  VQ scan 07/2013 neg , low prob. For PE/    08/31/14 Pulmonary eval /Ellanor Feuerstein re doe  recs Stop symbicort  Change protonix Take 30-60 min before first meal of the day  GERD diet  Keep your follow up appt to see Hochrein because your pulse rate with exercise is your only abnormality but if this problem remains undiagnosed I strongly recommend a CPST  And no reason to do any pulmonary follow up until you've had that test    01/29/15 Follow up : COPD  Pt returns for persistent dyspnea/DOE. Says his continues to get sob with activity. He has undergone an extensive workup with PCP, Cardiology and pulmonary. See above test/events.  Seen at PCP with episode of tachycardia and DOE. Recent ablation in Sept 2016  . EKG done with NSR w/ HR at 78, CXR done on 11/18 with nad.  CT chest done in June this year with emphysema , w/  no acute dz.  Spirometry in June this year with moderate COPD with FEV1 at 68%.  Remains on Symbicort.  Hx of DVT and PE on coumadin , INR has been therapeutic. VQ scan last year was neg.  Nuclear stress test repeated July 2016  with no ischemia noted. .  Retired [employer] , unknown exposures .  Previous on amio for Atrial fib . PFT in may 2015 with minimal airflow obstruction. +diffusing defect. No desats with walking. CT chest showed no acute process, emphysema changes.  rec Labs today  Continue on Symbicort Twice daily         04/10/2015  Extended f/u ov/Bright Spielmann re: sob / started breo 04/09/15  Chief Complaint  Patient presents with  . Follow-up    Pt states breathing is unchanged. He has had prod cough with yellow sputum and also has noticed some wheezing.   x 2 years variable doe assoc with wt gain can no longer do any steps and variable sob p across the room to sev hundred feet on a good day  Felt better for a matter of days p cardioversion and after ablation but w/in a week worse again despite apparently maintaining nsr  More recent issues x sev weeks indolent onset persistent day > noct cough/ min production  No obvious  day to day or daytime variability or assoc cp or chest tightness,   overt sinus or hb symptoms. No unusual exp hx or h/o childhood pna/ asthma or knowledge of premature birth.  Sleeping ok without nocturnal  or early am exacerbation  of respiratory  c/o's or need for noct saba. Also denies any obvious fluctuation of symptoms with weather or environmental changes or other aggravating or alleviating factors except as outlined above   Current Medications, Allergies, Complete Past Medical History, Past Surgical History, Family History, and Social History were reviewed in [employer] record.  ROS  The following are not active complaints unless bolded sore throat, dysphagia, dental problems, itching, sneezing,  nasal congestion or excess/ purulent  secretions, ear ache,   fever, chills, sweats, unintended wt loss, classically pleuritic or exertional cp, hemoptysis,  orthopnea pnd or leg swelling, presyncope, palpitations, abdominal pain, anorexia, nausea, vomiting, diarrhea  or change in bowel or bladder habits, change in stools or urine, dysuria,hematuria,  rash, arthralgias, visual complaints, headache, numbness, weakness or ataxia or problems with walking or coordination,  change in mood/affect or memory.               Objective:   Physical Exam   GEN: A/Ox3; pleasant , NAD, obese   Wt Readings from Last 3 Encounters:  04/10/15 276 lb (125.193 kg)  04/05/15 275 lb (124.739 kg)  01/29/15 277 lb (125.646 kg)    Vital signs reviewed   HEENT:  [place]/AT,  EACs-clear, TMs-wnl, NOSE-clear, THROAT-clear, no lesions, no postnasal drip or exudate noted.   NECK:  Supple w/ fair ROM; no JVD; normal carotid impulses w/o bruits; no thyromegaly or nodules palpated; no lymphadenopathy.  RESP  Clear  P & A; w/o, wheezes/ rales/ or rhonchi.no accessory muscle use, no dullness to percussion  CARD:  RRR, no m/r/g  , tr  peripheral edema, pulses intact, no cyanosis or clubbing.  GI:   Soft & nt; nml bowel sounds; no organomegaly or masses detected.  Musco: Warm bil, no deformities or joint swelling noted.   Neuro: alert, no focal deficits noted.    Skin: Warm, no lesions or rashes     I personally reviewed images and agree with radiology impression as follows:  CXR:  01/25/15 No evidence of acute cardiopulmonary disease.      Assessment & Plan:

## 2015-04-10 NOTE — Patient Instructions (Addendum)
Change protonix to Take 30- 60 min before your first and last meals of the day   GERD (REFLUX)  is an extremely common cause of respiratory symptoms just like yours , many times with no obvious heartburn at all.    It can be treated with medication, but also with lifestyle changes including elevation of the head of your bed (ideally with 6 inch  bed blocks),  Smoking cessation, avoidance of late meals, excessive alcohol, and avoid fatty foods, chocolate, peppermint, colas, red wine, and acidic juices such as orange juice.  NO MINT OR MENTHOL PRODUCTS SO NO COUGH DROPS  USE SUGARLESS CANDY INSTEAD (Jolley ranchers or Stover's or Life Savers) or even ice chips will also do - the key is to swallow to prevent all throat clearing. NO OIL BASED VITAMINS - use powdered substitutes.    Ok to use the breo daily until you use it up to see what difference if any it makes    To get the most out of exercise, you need to be continuously aware that you are short of breath, but never out of breath, for 30 minutes daily. As you improve, it will actually be easier for you to do the same amount of exercise  in  30 minutes so always push to the level where you are short of breath.  If this does not result in gradual improvement the next step is a cpst - call [facility] at [phone]

## 2015-04-12 ENCOUNTER — Telehealth: Payer: Self-pay | Admitting: Cardiology

## 2015-04-12 NOTE — Telephone Encounter (Signed)
SPOKE TO WIFE WIFE STATES UNABLE TO WALK SHORT DISTANCE, FATIQUE ,COUGH PATIENT SAW DR Melvyn Novas 2 DAYS AGO. PATIENT HAS AN APPOINTMENT WITH NEW PRIMARY DR  Marzetta Board BURNS 04/17/15( TO GET ESTABLISH)  APPOINTMENT SCHEDULE WITH RHONDA BARRETT PA 04/23/15 8:30 AM IF BECOME WORSE GO TO ER. WIFE VERBALIZED UNDERSTANDING.

## 2015-04-12 NOTE — Telephone Encounter (Signed)
Calling to because [name] [name] has been having shortness of breath , fatique , and coughing . Please call   Thanks

## 2015-04-14 NOTE — Assessment & Plan Note (Addendum)
-   08/31/2014  Walked RA x 3 laps @ 185 ft each @ brisk pace stopped due end of study  But 2nd lap noted  erratic pulse / bradycardia correlated with sob/ not desat  but could then finish the third lap once pulse picked back up  - 04/10/2015   [facility] RA  2 laps @ 185 ft each stopped due to sob/ ekg s ischemia p finished in NSR / lvh criteria  - Trial of Breo 04/09/15 plus added max gerd rx 04/10/2015   Symptoms are markedly disproportionate to objective findings and not clear this is a lung problem but pt does appear to have difficult airway management issues. DDX of  difficult airways management almost all start with A and  include Adherence, Ace Inhibitors, Acid Reflux, Active Sinus Disease, Alpha 1 Antitripsin deficiency, Anxiety masquerading as Airways dz,  ABPA,  Allergy(esp in young), Aspiration (esp in elderly), Adverse effects of meds,  Active smokers, A bunch of PE's (a small clot burden can't cause this syndrome unless there is already severe underlying pulm or vascular dz with poor reserve) plus two Bs  = Bronchiectasis and Beta blocker use..and one C= CHF   Adherence is always the initial "prime suspect" and is a multilayered concern that requires a "trust but verify" approach in every patient - starting with knowing how to use medications, especially inhalers, correctly, keeping up with refills and understanding the fundamental difference between maintenance and prns vs those medications only taken for a very short course and then stopped and not refilled.   ? Acid (or non-acid) GERD > always difficult to exclude as up to 75% of pts in some series report no assoc GI/ Heartburn symptoms> rec max (24h)  acid suppression and diet restrictions/ reviewed and instructions given in writing.  ? Allergy/ asthma but he clearly has min copd/ ok to continue BREO until finished   ? Anxiety related to obesity/ deconditioning ? Underlying depression > dx of exclusion  ? A bunch of PE's > very unlikely,  already on coumadin  ? CHF/ cardiac > rx  per Dr Hochrein>>>  best way to sort out is CPST next step if doesn't improve with reg sub max ex   I had an extended discussion with the patient reviewing all relevant studies completed to date and  lasting 25 minutes of a 40 minute extended  visit  Directed at refractory chronic symptoms  Each maintenance medication was reviewed in detail including most importantly the difference between maintenance and prns and under what circumstances the prns are to be triggered using an action plan format that is not reflected in the computer generated alphabetically organized AVS.    Please see instructions for details which were reviewed in writing and the patient given a copy highlighting the part that I personally wrote and discussed at today's ov.

## 2015-04-14 NOTE — Assessment & Plan Note (Signed)
-   Quit smoking 2002 at wt 220  -PFTs 07/25/13 FEV1  2.94 (77%) ratio 69 s reversibility  and dlco 46 corrects to 56 - spirometry 08/21/14 FEV1  2.65 (68%) ratio 69  - 08/31/2014  Walked RA x 3 laps @ 185 ft each stopped due to sob and erratic pulse / bradycardia correlated with sob/ not desat  - try off all maint inhalers 09/01/2014 > ? Worse and back on BREO as of 04/10/2015    When respiratory symptoms begin or become refractory well after a patient reports complete smoking cessation,  Especially when this wasn't the case while they were smoking, a red flag is raised based on the work of Dr Kris Mouton which states:  if you quit smoking when your best day FEV1 is still well preserved it is highly unlikely you will progress to severe disease.  That is to say, once the smoking stops,  the symptoms should not suddenly erupt or markedly worsen.  If so, the differential diagnosis should include  obesity/deconditioning,  LPR/Reflux/Aspiration syndromes,  occult CHF, or  especially side effect of medications commonly used in this population.    I note he's gained a full 50 lbs since d/c'd cigs and suspect this is the main problem on his "best days" where he's so with walking > 100 ft but would not explain the variable component.  For now leave on BREO long enough to see what good if any it does vs symbicort previously

## 2015-04-17 ENCOUNTER — Ambulatory Visit (INDEPENDENT_AMBULATORY_CARE_PROVIDER_SITE_OTHER): Payer: Medicare Other | Admitting: Internal Medicine

## 2015-04-17 ENCOUNTER — Other Ambulatory Visit (INDEPENDENT_AMBULATORY_CARE_PROVIDER_SITE_OTHER): Payer: Medicare Other

## 2015-04-17 ENCOUNTER — Encounter: Payer: Self-pay | Admitting: Internal Medicine

## 2015-04-17 VITALS — BP 124/84 | HR 78 | Temp 98.1°F | Resp 18 | Wt 276.0 lb

## 2015-04-17 DIAGNOSIS — I10 Essential (primary) hypertension: Secondary | ICD-10-CM | POA: Diagnosis not present

## 2015-04-17 DIAGNOSIS — R7303 Prediabetes: Secondary | ICD-10-CM | POA: Diagnosis not present

## 2015-04-17 DIAGNOSIS — E669 Obesity, unspecified: Secondary | ICD-10-CM

## 2015-04-17 DIAGNOSIS — R739 Hyperglycemia, unspecified: Secondary | ICD-10-CM | POA: Diagnosis not present

## 2015-04-17 DIAGNOSIS — K219 Gastro-esophageal reflux disease without esophagitis: Secondary | ICD-10-CM | POA: Diagnosis not present

## 2015-04-17 DIAGNOSIS — I2510 Atherosclerotic heart disease of native coronary artery without angina pectoris: Secondary | ICD-10-CM | POA: Diagnosis not present

## 2015-04-17 DIAGNOSIS — I480 Paroxysmal atrial fibrillation: Secondary | ICD-10-CM

## 2015-04-17 DIAGNOSIS — R0600 Dyspnea, unspecified: Secondary | ICD-10-CM

## 2015-04-17 LAB — COMPREHENSIVE METABOLIC PANEL
ALT: 21 U/L (ref 0–53)
AST: 23 U/L (ref 0–37)
Albumin: 4.3 g/dL (ref 3.5–5.2)
Alkaline Phosphatase: 95 U/L (ref 39–117)
BUN: 15 mg/dL (ref 6–23)
CHLORIDE: 104 meq/L (ref 96–112)
CO2: 27 mEq/L (ref 19–32)
Calcium: 9.5 mg/dL (ref 8.4–10.5)
Creatinine, Ser: 0.98 mg/dL (ref 0.40–1.50)
GFR: 80.12 mL/min (ref >60.00)
GLUCOSE: 84 mg/dL (ref 70–99)
POTASSIUM: 4.7 meq/L (ref 3.5–5.1)
SODIUM: 138 meq/L (ref 135–145)
Total Bilirubin: 0.4 mg/dL (ref 0.2–1.2)
Total Protein: 7.8 g/dL (ref 6.0–8.3)

## 2015-04-17 LAB — HEMOGLOBIN A1C: Hgb A1c MFr Bld: 6.4 % (ref 4.6–6.5)

## 2015-04-17 MED ORDER — TRAZODONE HCL 50 MG PO TABS: 50.0000 mg | ORAL_TABLET | Freq: Every evening | ORAL | Status: DC | PRN

## 2015-04-17 NOTE — Assessment & Plan Note (Signed)
Check A1c Stressed low sugar/provider diet, regular exercise and weight loss

## 2015-04-17 NOTE — Patient Instructions (Signed)
  We have reviewed your prior records including labs and tests today.  Test(s) ordered today. Your results will be released to [place] (or called to you) after review, usually within 72hours after test completion. If any changes need to be made, you will be notified at that same time.   Medications reviewed and updated.  Changes include trying trazodone for sleep.  Stop the lorazepam.    Your prescription(s) have been submitted to your pharmacy. Please take as directed and contact our office if you believe you are having problem(s) with the medication(s).  Please schedule followup in 6 months - physical

## 2015-04-17 NOTE — Progress Notes (Signed)
Pre visit review using our clinic review tool, if applicable. No additional management support is needed unless otherwise documented below in the visit note. 

## 2015-04-17 NOTE — Assessment & Plan Note (Signed)
Stressed the importance of decreasing portions, eating a healthy diet and ideally increasing his exercise Offered nutrition referral-declined

## 2015-04-17 NOTE — Assessment & Plan Note (Addendum)
No chest pain Following with cardiology meds per them

## 2015-04-17 NOTE — Progress Notes (Signed)
Subjective:    Patient ID: [name] [name], male    DOB: 1946-02-22, 70 y.o.   MRN: [id number]  HPI He is here to establish with a new pcp.    He is here for followup.  Hyperlipidemia: He is taking his medication daily. He is fairly compliant with a low fat/cholesterol diet. He is not exercising regularly. He denies myalgias.   CAD, htn, afib:  He is following with cardiology.  He is taking his medication daily. He is compliant with a low sodium diet.  He denies chest pain, palpitations, edema, and regular headaches. He is not exercising regularly.  He has intermittent afib - he does not feel palpitations, but causes sob, lightheadedness and it lasts longer than his more frequent dyspnea with exertion.   COPD:  He has seen pulmonary.  He has been on symbicort and it does not help.  He has been using Breo and it has helped.  His DOE is worse with incline or long distances.  It is not always consistent - sometimes he does not get it and sometimes he gets it walking to the bathroom. He has seen two different pulmonologist's and they do not feel he has COPD and it is his heart or weight causing the SOB.    GERD:  He is taking his medication daily as prescribed.  He denies any GERD symptoms and feels his GERD is well controlled.   Depression or anxiety:  He is taking sertraline daily.  He is unsure if it is working.  He calls it his my "be nice to my wife pill".    Obesity:  He quit smoking in 2002 and has gained weight.  He has difficulty walking - he gets SOB and has to stop.  He has seen pulmonary and was told he was obese and to walk more.  The other pulmonary doctor told him it was his heart.  His cardiologist told him it was his lungs.  He has had DOE for a couple of years.  He is very sedentary because of the DOE.   Insomnia:  He takes ativan as needed to help him sleep.  He typically takes it 1-2 times a week, at most three times a week.  It is not working as well.  He wonders what else he  can try.   Prediabetes:  He is not compliant with a low sugar/carbohydrate diet.  He is not exercising due to shortness of breath with exertion.  His biggest exercise is going to [employer] twice a week - he is able to stop frequently.    Medications and allergies reviewed with patient and updated if appropriate.  Patient Active Problem List   Diagnosis Date Noted  . Prediabetes 04/17/2015  . COPD GOLD II 08/21/2014  . COPD exacerbation ([place]) 08/21/2014  . Anemia 08/20/2014  . Hyperlipidemia 08/20/2014  . Hyperglycemia 08/20/2014  . Spinal stenosis of lumbar region 05/30/2014  . Abnormal CT of the abdomen 05/30/2014  . Dizziness 02/08/2014  . Macrocytic anemia 08/08/2013  . CAD (coronary artery disease)   . Dyspnea 08/07/2013  . AAA (abdominal aortic aneurysm) without rupture ([place]) 05/02/2013  . Obesity (BMI 30-39.9) 03/21/2013  . Family history of malignant neoplasm of gastrointestinal tract 08/25/2011  . 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 Fatigue 03/24/2011  . DOE (dyspnea on exertion) 03/12/2011  . Warfarin anticoagulation 03/11/2011  . INSOMNIA-SLEEP DISORDER-UNSPEC 09/12/2009  . CERVICAL RADICULOPATHY, RIGHT 07/24/2009  . SNORING, HX OF 07/24/2009  . DEGENERATIVE  JOINT DISEASE, ADVANCED 05/21/2009  . AVASCULAR NECROSIS 05/21/2009  . ALLERGIC RHINITIS CAUSE UNSPECIFIED 11/01/2008  . PULMONARY NODULE, RIGHT LOWER LOBE 10/25/2008  . DIVERTICULOSIS, COLON 10/07/2008  . OBSTRUCTIVE CARDIOMYOPATHY 10/05/2008  . PULMONARY EMBOLISM, HX OF 10/05/2008  . EDEMA- LOCALIZED 05/07/2008  . ASTHMA 02/20/2008  . OTHER AND UNSPECIFIED HYPERLIPIDEMIA 09/08/2007  . HYPERPLASIA PROSTATE UNS W/UR OBST & OTH LUTS 06/28/2007  . PAF (paroxysmal atrial fibrillation) ([place]) 06/25/2007  . NOCTURIA 05/12/2007  . COLONIC POLYPS 04/01/2006  . GOUT 04/01/2006  . Essential hypertension 04/01/2006  . ACID REFLUX DISEASE 04/01/2006    Current Outpatient Prescriptions on File Prior to Visit    Medication Sig Dispense Refill  . amLODipine (NORVASC) 5 MG tablet Take 1 tablet by mouth daily.    [name] aspirin 81 MG chewable tablet Chew 1 tablet by mouth daily.    [name] atorvastatin (LIPITOR) 10 MG tablet Take 1 tablet (10 mg total) by mouth every evening. 90 tablet 1  . Fluticasone Furoate-Vilanterol 100-25 MCG/INH AEPB Inhale 1 puff into the lungs daily. 28 each 0  . nitroGLYCERIN (NITROSTAT) 0.4 MG SL tablet Place 1 tablet (0.4 mg total) under the tongue every 5 (five) minutes as needed for chest pain (up to 3 doses). 25 tablet 4  . pantoprazole (PROTONIX) 40 MG tablet Take 1 tablet (40 mg total) by mouth 2 (two) times daily before a meal.  4  . sertraline (ZOLOFT) 50 MG tablet Take 1 tablet (50 mg total) by mouth daily. --- Please establish with new PCP for further refills 90 tablet 0  . warfarin (COUMADIN) 3 MG tablet TAKE ONE AND ONE-HALF TO TWO TABLETS DAILY AS DIRECTED BY COUMADIN CLINIC 180 tablet 1   No current facility-administered medications on file prior to visit.    Past Medical History  Diagnosis Date  . BPH (benign prostatic hypertrophy)   . Spinal stenosis     [place]  . Hypertension   . Asthma   . DVT (deep venous thrombosis) ([place])     a. 2010 Lower ext s/p back surgery.  . Pulmonary embolism ([place])     a. 2010 in setting of DVT post-op back surgery. b. Low prob VQ 07/2013.  [name] PAF (paroxysmal atrial fibrillation) ([place])     a. Flecainide discontinued 07/2013 in setting of CAD.; b. s/p PVI Ablation at [facility] (Dr Joseph Berkshire) 11/2014  . 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 COPD (chronic obstructive pulmonary disease) ([place])     a. 07/2013 PFT's mild airflow obstruction, no restriction, sev decrease in DLCO.  [name] GERD (gastroesophageal reflux disease)   . Diverticulosis   . Hx of echocardiogram 2015    Echo (06/2013): EF 60-65% normal wall motion, normal diastolic function, aortic sclerosis without stenosis, Trivial MR, mild SAM due to long, redundant mitral leaflets,  mild RAE, normal RVSF  . Dyspnea on exertion     a. 07/2013 PFT's mild airflow obstr   . CAD (coronary artery disease)     a. 07/2013: s/p DES to LAD, normal LVF.  . Macrocytic anemia     a. 07/2013: documented on prior labs.  . Obesity   . Transaminitis     a. 07/2013: mild.  . Complication of anesthesia     "I got all kinds of hallucinations"  . Pneumonia ~ 2010 X 1  . History of hiatal hernia   . Arthritis   . History of gout     Past Surgical History  Procedure Laterality Date  . Vasectomy    .  Lumbar fusion  02/2008  . Total hip arthroplasty Bilateral   . Cataract extraction w/ intraocular lens  implant, bilateral Bilateral   . Colonoscopy w/ polypectomy  2004  . Joint replacement    . Anterior lumbar disc arthroplasty  03/2008    "spacer poped out; had to repair"  . Coronary angioplasty with stent placement  08/08/2013    "1"  . Left and right heart catheterization with coronary angiogram N/A 08/07/2013    Procedure: LEFT AND RIGHT HEART CATHETERIZATION WITH CORONARY ANGIOGRAM;  Surgeon: Peter M Martinique, MD;  Location: [facility] CATH LAB;  Service: Cardiovascular;  Laterality: N/A;  . Percutaneous coronary stent intervention (pci-s)  08/07/2013    Procedure: PERCUTANEOUS CORONARY STENT INTERVENTION (PCI-S);  Surgeon: Peter M Martinique, MD;  Location: [facility] CATH LAB;  Service: Cardiovascular;;  . Colonoscopy with polypectomy  09/2011    2 tubular adenomas  . Pvi ablation  11/2014    Dr. Venita Sheffield [facility]    Social History   Social History  . Marital Status: Married    Spouse Name: N/A  . Number of Children: 4  . Years of Education: N/A   Occupational History  . Retired from [employer]  . Smoking status: Former Smoker -- 2.00 packs/day for 40 years    Types: Cigarettes    Quit date: 03/09/2000  . Smokeless tobacco: Never Used     Comment: smoked 1964-2002, up to 3 ppd  . Alcohol Use: 0.0 oz/week    0 Standard drinks or equivalent per week      Comment: 02/08/2014 "drink a couple times/month"  . Drug Use: No  . Sexual Activity: Not on file     Comment: "sexually hx is none of your business" (02/08/2014)   Other Topics Concern  . Not on file   Social History Narrative    Family History  Problem Relation Age of Onset  . Atrial fibrillation Mother   . COPD Mother   . Heart disease Father   . Colon cancer Father   . Heart attack Brother 74  . Alcohol abuse Brother 55    Died post liver transplant  . Stroke Neg Hx   . Heart attack Paternal Grandfather     Over 41  . Heart attack Paternal Uncle     Less than 55    Review of Systems  Constitutional: Negative for fever and chills.  HENT: Negative for postnasal drip.   Respiratory: Positive for cough (for years, dry - feels like he is clearing his throat) and shortness of breath. Negative for wheezing.   Cardiovascular: Positive for leg swelling (occ left ankle). Negative for chest pain and palpitations.  Gastrointestinal:       GERD controlled  Neurological: Positive for dizziness (with SOB with exertion) and light-headedness (with SOB -exertion). Negative for headaches.       Objective:   Filed Vitals:   04/17/15 1328  BP: 124/84  Pulse: 78  Temp: 98.1 F (36.7 C)  Resp: 18   Filed Weights   04/17/15 1328  Weight: 276 lb (125.193 kg)   Body mass index is 35.42 kg/(m^2).   Physical Exam Constitutional: Appears well-developed and well-nourished. No distress.  Neck: Neck supple. No tracheal deviation present. No thyromegaly present.  No carotid bruit. No cervical adenopathy.   Cardiovascular: Normal rate, regular rhythm and normal heart sounds.   No murmur heard.  No edema Pulmonary/Chest: Effort normal and breath sounds normal. No  respiratory distress. No wheezes.      Assessment & Plan:    See Problem List for Assessment and Plan of chronic medical problems.  Follow-up in 6 months, sooner if needed

## 2015-04-17 NOTE — Assessment & Plan Note (Signed)
BP Readings from Last 3 Encounters:  04/17/15 124/84  04/10/15 144/80  04/05/15 118/80   bp controlled today Continue current medication

## 2015-04-17 NOTE — Assessment & Plan Note (Signed)
Continue current medication Controlled

## 2015-04-17 NOTE — Assessment & Plan Note (Signed)
Does not feel palpitations, but does get some shortness of breath and lightheadedness Rate control, in sinus rhythm on exam On warfarin for anticoagulation Continue current medications

## 2015-04-17 NOTE — Assessment & Plan Note (Signed)
Likely multifactorial-obesity, lung disease, possible heart disease Stressed the importance of losing weight and decreased portions and healthy diet. Eliminate most sugars and carbohydrates He has seen some improvement with Breo -  A sample of the higher dose to see if that helps more Continue routine cardiology follow-up

## 2015-04-19 ENCOUNTER — Other Ambulatory Visit: Payer: Self-pay | Admitting: Cardiology

## 2015-04-23 ENCOUNTER — Ambulatory Visit: Payer: Medicare Other | Admitting: Physician Assistant

## 2015-04-24 NOTE — Progress Notes (Signed)
Same day cancellation

## 2015-04-25 ENCOUNTER — Encounter: Payer: Medicare Other | Admitting: Cardiology

## 2015-04-26 ENCOUNTER — Ambulatory Visit: Payer: Medicare Other | Admitting: Family

## 2015-04-27 ENCOUNTER — Encounter: Payer: Self-pay | Admitting: Internal Medicine

## 2015-04-27 ENCOUNTER — Ambulatory Visit (INDEPENDENT_AMBULATORY_CARE_PROVIDER_SITE_OTHER): Payer: Medicare Other | Admitting: Internal Medicine

## 2015-04-27 VITALS — BP 110/72 | HR 75 | Temp 98.0°F | Resp 18 | Wt 274.0 lb

## 2015-04-27 DIAGNOSIS — J449 Chronic obstructive pulmonary disease, unspecified: Secondary | ICD-10-CM

## 2015-04-27 DIAGNOSIS — I2510 Atherosclerotic heart disease of native coronary artery without angina pectoris: Secondary | ICD-10-CM

## 2015-04-27 NOTE — Progress Notes (Signed)
Subjective:  Patient ID: [name] [name], male    DOB: [birth year]  Age: 70 y.o. MRN: [id number]  CC: COPD   HPI [name] [name] presents for a check up - his wife was treated for a URI yesterday and wanted her husband to get checked out today. He has no new s/s. He has had a chronic NP cough and throat-clearing for 15 years due to COPD (prior tobacco abuse) with no recent change in his symptoms. He does not think he needed to be seen today.  Outpatient Prescriptions Prior to Visit  Medication Sig Dispense Refill  . amLODipine (NORVASC) 5 MG tablet Take 1 tablet by mouth daily.    [name] aspirin 81 MG chewable tablet Chew 1 tablet by mouth daily.    [name] atorvastatin (LIPITOR) 10 MG tablet Take 1 tablet (10 mg total) by mouth every evening. 90 tablet 1  . Fluticasone Furoate-Vilanterol 100-25 MCG/INH AEPB Inhale 1 puff into the lungs daily. 28 each 0  . nitroGLYCERIN (NITROSTAT) 0.4 MG SL tablet Place 1 tablet (0.4 mg total) under the tongue every 5 (five) minutes as needed for chest pain (up to 3 doses). 25 tablet 4  . pantoprazole (PROTONIX) 40 MG tablet Take 1 tablet (40 mg total) by mouth 2 (two) times daily before a meal.  4  . pantoprazole (PROTONIX) 40 MG tablet TAKE 1 TABLET AT BEDTIME 90 tablet 1  . sertraline (ZOLOFT) 50 MG tablet Take 1 tablet (50 mg total) by mouth daily. --- Please establish with new PCP for further refills 90 tablet 0  . traZODone (DESYREL) 50 MG tablet Take 1-2 tablets (50-100 mg total) by mouth at bedtime as needed for sleep. 60 tablet 3  . warfarin (COUMADIN) 3 MG tablet TAKE ONE AND ONE-HALF TO TWO TABLETS DAILY AS DIRECTED BY COUMADIN CLINIC 180 tablet 1   No facility-administered medications prior to visit.    ROS Review of Systems  Constitutional: Negative.  Negative for fever, chills and fatigue.  HENT: Negative.  Negative for congestion, sore throat and trouble swallowing.   Eyes: Negative.   Respiratory: Positive for cough. Negative for choking, chest  tightness, shortness of breath, wheezing and stridor.   Cardiovascular: Negative.  Negative for chest pain.  Gastrointestinal: Negative.  Negative for nausea, abdominal pain and diarrhea.  Endocrine: Negative.   Genitourinary: Negative.   Musculoskeletal: Negative.   Skin: Negative for color change and rash.  Allergic/Immunologic: Negative.   Neurological: Negative.   Hematological: Negative.   Psychiatric/Behavioral: Negative.     Objective:  BP 110/72 mmHg  Pulse 75  Temp(Src) 98 F (36.7 C) (Oral)  Resp 18  Wt 274 lb (124.286 kg)  SpO2 97%  BP Readings from Last 3 Encounters:  04/27/15 110/72  04/17/15 124/84  04/10/15 144/80    Wt Readings from Last 3 Encounters:  04/27/15 274 lb (124.286 kg)  04/17/15 276 lb (125.193 kg)  04/10/15 276 lb (125.193 kg)    Physical Exam  Constitutional: He is oriented to person, place, and time. He appears well-developed and well-nourished.  Non-toxic appearance. He does not have a sickly appearance. He does not appear ill. No distress.  HENT:  Head: Normocephalic and atraumatic.  Mouth/Throat: Oropharynx is clear and moist. No oropharyngeal exudate.  Eyes: Conjunctivae are normal. Right eye exhibits no discharge. Left eye exhibits no discharge. No scleral icterus.  Neck: Normal range of motion. Neck supple. No JVD present. No tracheal deviation present. No thyromegaly present.  Cardiovascular: Normal rate, regular  rhythm, normal heart sounds and intact distal pulses.  Exam reveals no gallop and no friction rub.   No murmur heard. Pulmonary/Chest: Effort normal and breath sounds normal. No stridor. No respiratory distress. He has no wheezes. He has no rales. He exhibits no tenderness.  Abdominal: Soft. Bowel sounds are normal. He exhibits no distension. There is no tenderness. There is no rebound and no guarding.  Musculoskeletal: Normal range of motion. He exhibits no edema or tenderness.  Lymphadenopathy:    He has no cervical  adenopathy.  Neurological: He is oriented to person, place, and time.  Skin: Skin is warm and dry. No rash noted. He is not diaphoretic. No erythema. No pallor.  Vitals reviewed.   Lab Results  Component Value Date   WBC 10.5 11/06/2014   HGB 13.4 11/06/2014   HCT 40.0 11/06/2014   PLT 468.0* 11/06/2014   GLUCOSE 84 04/17/2015   CHOL 112 09/04/2013   TRIG 186.0* 09/04/2013   HDL 36.90* 09/04/2013   LDLCALC 38 09/04/2013   ALT 21 04/17/2015   AST 23 04/17/2015   NA 138 04/17/2015   K 4.7 04/17/2015   CL 104 04/17/2015   CREATININE 0.98 04/17/2015   BUN 15 04/17/2015   CO2 27 04/17/2015   TSH 2.09 01/25/2015   PSA 3.51 05/12/2007   INR 2.3 03/25/2015   HGBA1C 6.4 04/17/2015   MICROALBUR 0.2 09/08/2007    Dg Chest 2 View  01/25/2015  CLINICAL DATA:  Dyspnea on exertion, abnormal breath sounds EXAM: CHEST  2 VIEW COMPARISON:  CT chest dated 08/29/2014 FINDINGS: Lungs are essentially clear. Mild bibasilar scarring. No pleural effusion or pneumothorax. The heart is normal in size. Degenerative changes of the visualized thoracolumbar spine. IMPRESSION: No evidence of acute cardiopulmonary disease. Electronically Signed   By: Julian Hy M.D.   On: 01/25/2015 15:32    Assessment & Plan:   [name] [name] was seen today for copd.  Diagnoses and all orders for this visit:  COPD GOLD II- he is well controlled on Breo, there are no signs of infection or exacerbations today.  I am having [name] [name] maintain his warfarin, aspirin, amLODipine, nitroGLYCERIN, sertraline, atorvastatin, fluticasone furoate-vilanterol, pantoprazole, traZODone, and pantoprazole.  No orders of the defined types were placed in this encounter.     Follow-up: No Follow-up on file.  Scarlette Calico, MD

## 2015-04-27 NOTE — Progress Notes (Signed)
Pre visit review using our clinic review tool, if applicable. No additional management support is needed unless otherwise documented below in the visit note. 

## 2015-04-30 ENCOUNTER — Ambulatory Visit: Payer: Medicare Other | Admitting: Family

## 2015-04-30 ENCOUNTER — Other Ambulatory Visit (HOSPITAL_COMMUNITY): Payer: Medicare Other

## 2015-04-30 ENCOUNTER — Ambulatory Visit: Payer: Medicare Other | Admitting: Internal Medicine

## 2015-05-06 ENCOUNTER — Ambulatory Visit (INDEPENDENT_AMBULATORY_CARE_PROVIDER_SITE_OTHER): Payer: Medicare Other | Admitting: Pharmacist Clinician (PhC)/ Clinical Pharmacy Specialist

## 2015-05-06 DIAGNOSIS — I4891 Unspecified atrial fibrillation: Secondary | ICD-10-CM

## 2015-05-06 DIAGNOSIS — Z86718 Personal history of other venous thrombosis and embolism: Secondary | ICD-10-CM

## 2015-05-06 DIAGNOSIS — I480 Paroxysmal atrial fibrillation: Secondary | ICD-10-CM | POA: Diagnosis not present

## 2015-05-06 LAB — POCT INR: INR: 1.9

## 2015-05-13 ENCOUNTER — Ambulatory Visit (INDEPENDENT_AMBULATORY_CARE_PROVIDER_SITE_OTHER): Payer: Medicare Other | Admitting: Family Medicine

## 2015-05-13 ENCOUNTER — Ambulatory Visit (INDEPENDENT_AMBULATORY_CARE_PROVIDER_SITE_OTHER)
Admission: RE | Admit: 2015-05-13 | Discharge: 2015-05-13 | Disposition: A | Discharge status: Home / Self Care | Payer: Medicare Other | Source: Ambulatory Visit | Attending: Family Medicine | Admitting: Family Medicine

## 2015-05-13 ENCOUNTER — Encounter: Payer: Self-pay | Admitting: Family Medicine

## 2015-05-13 ENCOUNTER — Telehealth: Payer: Self-pay | Admitting: Internal Medicine

## 2015-05-13 VITALS — BP 118/70 | HR 79 | Temp 98.7°F | Ht 74.0 in | Wt 269.9 lb

## 2015-05-13 DIAGNOSIS — I2510 Atherosclerotic heart disease of native coronary artery without angina pectoris: Secondary | ICD-10-CM | POA: Diagnosis not present

## 2015-05-13 DIAGNOSIS — J988 Other specified respiratory disorders: Secondary | ICD-10-CM

## 2015-05-13 DIAGNOSIS — R509 Fever, unspecified: Secondary | ICD-10-CM | POA: Diagnosis not present

## 2015-05-13 DIAGNOSIS — R05 Cough: Secondary | ICD-10-CM | POA: Diagnosis not present

## 2015-05-13 DIAGNOSIS — J441 Chronic obstructive pulmonary disease with (acute) exacerbation: Secondary | ICD-10-CM | POA: Diagnosis not present

## 2015-05-13 DIAGNOSIS — J189 Pneumonia, unspecified organism: Secondary | ICD-10-CM | POA: Diagnosis not present

## 2015-05-13 LAB — POCT INFLUENZA A/B
INFLUENZA B, POC: NEGATIVE
Influenza A, POC: NEGATIVE

## 2015-05-13 MED ORDER — BENZONATATE 100 MG PO CAPS: 100.0000 mg | ORAL_CAPSULE | Freq: Two times a day (BID) | ORAL | Status: DC | PRN

## 2015-05-13 MED ORDER — PREDNISONE 20 MG PO TABS: 40.0000 mg | ORAL_TABLET | Freq: Every day | ORAL | Status: DC

## 2015-05-13 NOTE — Patient Instructions (Signed)
BEFORE YOU LEAVE: -xray sheet -rapid flu  Go get the xray

## 2015-05-13 NOTE — Telephone Encounter (Signed)
Patient Name: [name] [name]  DOB: 02-22-1946    Initial Comment Caller States her husband has a temp of 102, chills, headache, cough, would like to have an appointment for today    Nurse Assessment  Nurse: Raphael Gibney, RN, Vanita Ingles Date/Time Eilene Ghazi Time): 05/13/2015 8:11:58 AM  Confirm and document reason for call. If symptomatic, describe symptoms. You must click the next button to save text entered. ---Caller states she wants to make appt for today for spouse. He is coughing with a headache and temp of 100.6. Cough started on Friday. Has chills. He is congested  Has the patient traveled out of the country within the last 30 days? ---No  Does the patient have any new or worsening symptoms? ---Yes  Will a triage be completed? ---Yes  Related visit to physician within the last 2 weeks? ---No  Does the PT have any chronic conditions? (i.e. diabetes, asthma, etc.) ---Yes  List chronic conditions. ---COPD; CHF  Is this a behavioral health or substance abuse call? ---No     Guidelines    Guideline Title Affirmed Question Affirmed Notes  Cough - Acute Non-Productive Wheezing is present    Final Disposition User   See Physician within 4 Hours (or PCP triage) Raphael Gibney, RN, [place]    Comments  No appts available at [facility] and pt prefers the [place] office rather than going to another office.  Please call spouse back regarding appt   Referrals  GO TO FACILITY REFUSED   Disagree/Comply: Comply

## 2015-05-13 NOTE — Progress Notes (Signed)
HPI:  [name] [name] is a pleasant 70 yo with a PMH significant for COPD(treated by pulmonology), chronic anticoagulation (managed by cardiology) and GERD here for an acute visit for:  Cough and Fever: -started:reports this started suddenly 3 days ago -symptoms:cough productive of white sputum,congestion, fatigue, occasional wheezing, fevers (high of 100.5 per his report and resolved today) - feels a little better today and reports fevers have resolved -denies: SOB, NVD, tooth pain -has tried: nothing -sick contacts/travel/risks: no reported flu, strep or tick exposure -Hx of: seeing his cardiologist in a few days, recent INR mildly subtherapeutic  ROS: See pertinent positives and negatives per HPI.  Past Medical History  Diagnosis Date  . BPH (benign prostatic hypertrophy)   . Spinal stenosis     [place]  . Hypertension   . Asthma   . DVT (deep venous thrombosis) ([place])     a. 2010 Lower ext s/p back surgery.  . Pulmonary embolism ([place])     a. 2010 in setting of DVT post-op back surgery. b. Low prob VQ 07/2013.  [name] PAF (paroxysmal atrial fibrillation) ([place])     a. Flecainide discontinued 07/2013 in setting of CAD.; b. s/p PVI Ablation at [facility] (Dr Joseph Berkshire) 11/2014  . 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 COPD (chronic obstructive pulmonary disease) ([place])     a. 07/2013 PFT's mild airflow obstruction, no restriction, sev decrease in DLCO.  [name] GERD (gastroesophageal reflux disease)   . Diverticulosis   . Hx of echocardiogram 2015    Echo (06/2013): EF 60-65% normal wall motion, normal diastolic function, aortic sclerosis without stenosis, Trivial MR, mild SAM due to long, redundant mitral leaflets, mild RAE, normal RVSF  . Dyspnea on exertion     a. 07/2013 PFT's mild airflow obstr   . CAD (coronary artery disease)     a. 07/2013: s/p DES to LAD, normal LVF.  . Macrocytic anemia     a. 07/2013: documented on prior labs.  . Obesity   . Transaminitis     a. 07/2013:  mild.  . Complication of anesthesia     "I got all kinds of hallucinations"  . Pneumonia ~ 2010 X 1  . History of hiatal hernia   . Arthritis   . History of gout     Past Surgical History  Procedure Laterality Date  . Vasectomy    . Lumbar fusion  02/2008  . Total hip arthroplasty Bilateral   . Cataract extraction w/ intraocular lens  implant, bilateral Bilateral   . Colonoscopy w/ polypectomy  2004  . Joint replacement    . Anterior lumbar disc arthroplasty  03/2008    "spacer poped out; had to repair"  . Coronary angioplasty with stent placement  08/08/2013    "1"  . Left and right heart catheterization with coronary angiogram N/A 08/07/2013    Procedure: LEFT AND RIGHT HEART CATHETERIZATION WITH CORONARY ANGIOGRAM;  Surgeon: Peter M Martinique, MD;  Location: [facility] CATH LAB;  Service: Cardiovascular;  Laterality: N/A;  . Percutaneous coronary stent intervention (pci-s)  08/07/2013    Procedure: PERCUTANEOUS CORONARY STENT INTERVENTION (PCI-S);  Surgeon: Peter M Martinique, MD;  Location: [facility] CATH LAB;  Service: Cardiovascular;;  . Colonoscopy with polypectomy  09/2011    2 tubular adenomas  . Pvi ablation  11/2014    Dr. Venita Sheffield [facility]    Family History  Problem Relation Age of Onset  . Atrial fibrillation Mother   . COPD Mother   .  Heart disease Father   . Colon cancer Father   . Heart attack Brother 73  . Alcohol abuse Brother 38    Died post liver transplant  . Stroke Neg Hx   . Heart attack Paternal Grandfather     Over 17  . Heart attack Paternal Uncle     Less than 30    Social History   Social History  . Marital Status: Married    Spouse Name: N/A  . Number of Children: 4  . Years of Education: N/A   Occupational History  . Retired from [employer]  . Smoking status: Former Smoker -- 2.00 packs/day for 40 years    Types: Cigarettes    Quit date: 03/09/2000  . Smokeless tobacco: Never Used     Comment: smoked 1964-2002, up to  3 ppd  . Alcohol Use: 0.0 oz/week    0 Standard drinks or equivalent per week     Comment: 02/08/2014 "drink a couple times/month"  . Drug Use: No  . Sexual Activity: Not Asked     Comment: "sexually hx is none of your business" (02/08/2014)   Other Topics Concern  . None   Social History Narrative     Current outpatient prescriptions:  .  amLODipine (NORVASC) 5 MG tablet, Take 1 tablet by mouth daily., Disp: , Rfl:  .  aspirin 81 MG chewable tablet, Chew 1 tablet by mouth daily., Disp: , Rfl:  .  atorvastatin (LIPITOR) 10 MG tablet, Take 1 tablet (10 mg total) by mouth every evening., Disp: 90 tablet, Rfl: 1 .  Fluticasone Furoate-Vilanterol 100-25 MCG/INH AEPB, Inhale 1 puff into the lungs daily., Disp: 28 each, Rfl: 0 .  nitroGLYCERIN (NITROSTAT) 0.4 MG SL tablet, Place 1 tablet (0.4 mg total) under the tongue every 5 (five) minutes as needed for chest pain (up to 3 doses)., Disp: 25 tablet, Rfl: 4 .  pantoprazole (PROTONIX) 40 MG tablet, Take 1 tablet (40 mg total) by mouth 2 (two) times daily before a meal., Disp: , Rfl: 4 .  sertraline (ZOLOFT) 50 MG tablet, Take 1 tablet (50 mg total) by mouth daily. --- Please establish with new PCP for further refills, Disp: 90 tablet, Rfl: 0 .  traZODone (DESYREL) 50 MG tablet, Take 1-2 tablets (50-100 mg total) by mouth at bedtime as needed for sleep., Disp: 60 tablet, Rfl: 3 .  warfarin (COUMADIN) 3 MG tablet, TAKE ONE AND ONE-HALF TO TWO TABLETS DAILY AS DIRECTED BY COUMADIN CLINIC, Disp: 180 tablet, Rfl: 1 .  predniSONE (DELTASONE) 20 MG tablet, Take 2 tablets (40 mg total) by mouth daily with breakfast., Disp: 8 tablet, Rfl: 0  EXAM:  Filed Vitals:   05/13/15 1257  BP: 118/70  Pulse: 79  Temp: 98.7 F (37.1 C)    Body mass index is 34.64 kg/(m^2).  GENERAL: vitals reviewed and listed above, alert, oriented, appears well hydrated and in no acute distress  HEENT: atraumatic, conjunttiva clear, no obvious abnormalities on inspection  of external nose and ears, normal appearance of ear canals and TMs, clear nasal congestion, mild post oropharyngeal erythema with PND, no tonsillar edema or exudate, no sinus TTP  NECK: no obvious masses on inspection  LUNGS: clear to auscultation bilaterally except for rhonchorous breath sounds right base, no signs of respiratory distress, O2 sats 95% on room air  CV: HRRR, no peripheral edema  MS: moves all extremities without noticeable abnormality  PSYCH: pleasant and cooperative, no obvious depression  or anxiety  ASSESSMENT AND PLAN:  Discussed the following assessment and plan:  COPD exacerbation ([place]) - Plan: DG Chest 2 View, POC Influenza A/B, CANCELED: DG Chest 2 View  Respiratory infection  -we discussed potential etiologies with COPD with acute exacerbation being most likely 2ndary to vuri, possible influenza -rapid flu neg and CXR pending given lung findings -likely will need steroids, possible abx, prompt f/u in coumadin clinic and pulmonology follow up if not improving -of course, we advised to return or notify a doctor immediately if symptoms worsen or persist or new concerns arise.    Patient Instructions  BEFORE YOU LEAVE: -xray sheet -rapid flu  Go get the xray       KIM, Jarrett Soho R.

## 2015-05-13 NOTE — Telephone Encounter (Signed)
He needs to be seen but I can not see him today

## 2015-05-13 NOTE — Telephone Encounter (Signed)
Wife is calling back for chest Xray results and a medication after Dr Maudie Mercury looked at his medical history. Wife would like a call back as soon as Dr Maudie Mercury knows something.  Wife worried he may need antibiotic.  [facility]. Thanks.

## 2015-05-13 NOTE — Progress Notes (Signed)
Pre visit review using our clinic review tool, if applicable. No additional management support is needed unless otherwise documented below in the visit note. 

## 2015-05-13 NOTE — Telephone Encounter (Signed)
Pt was seen at [facility] today for symptoms.

## 2015-05-13 NOTE — Telephone Encounter (Signed)
Patient's wife was informed of the results.

## 2015-05-15 NOTE — Progress Notes (Signed)
HPI  The patient presents for follow up of atrial fibrillation, HTN, prior DVT and pulmonary emboli and CAD.   He was in [place] in the past and went into atrial fib and he developed symptoms. [name] [name]  He was hospitalized at the [facility].  He did have a nuclear stress test which was negative.  He was started on Sotalol but his QTc increased on 120 mg BID.  He was discharged on 80 mg BID.  The patient says did require cardioversion.  Of note an echocardiogram did demonstrate MR.  He did have some bradycardia and orthostasis during that hospitalization.  Of note he was also screened for septal hypertrophy as there was some of this with SAM noted on the echo.  However, MRI did not confirm this.    He went back at the [facility] and had an ablation by Dr. Alcide Clever.  Amiodarone was discontinued.  He has been back to [facility] clinic again in December and I reviewed these records. He had a CT scan and had no evidence of pulmonary vein stenosis.  He continues to have chronic dyspnea but this is unchanged from previous. His wife called in recently and said he was having some more shortness of breath and cough. I reviewed office records from her primary office and he was treated for a COPD exacerbation couple of days ago and is now on COPD. We thought about giving antibiotics but didn't do this because of his Coumadin. He's not having any fevers or chills. His breathing is improved on the steroids. He has baseline dyspnea with exertion but when we walked him around the office his sats never fell below 94%. He does have tachypnea. However, again he's had an extensive workup as above. He's had no new chest pressure, neck or arm discomfort. He's had no new palpitations, presyncope or syncope. He's had no PND or orthopnea.  Allergies  Allergen Reactions  . Penicillins Anaphylaxis     Because of a history of documented adverse serious drug reaction;Medi Alert bracelet  is recommended    Current  Outpatient Prescriptions  Medication Sig Dispense Refill  . amLODipine (NORVASC) 5 MG tablet Take 1 tablet by mouth daily.    [name] [name] aspirin 81 MG chewable tablet Chew 1 tablet by mouth daily.    [name] [name] atorvastatin (LIPITOR) 10 MG tablet Take 1 tablet (10 mg total) by mouth every evening. 90 tablet 1  . benzonatate (TESSALON) 100 MG capsule Take 1 capsule (100 mg total) by mouth 2 (two) times daily as needed for cough. 20 capsule 0  . Fluticasone Furoate-Vilanterol 100-25 MCG/INH AEPB Inhale 1 puff into the lungs daily. 28 each 0  . nitroGLYCERIN (NITROSTAT) 0.4 MG SL tablet Place 1 tablet (0.4 mg total) under the tongue every 5 (five) minutes as needed for chest pain (up to 3 doses). 25 tablet 4  . pantoprazole (PROTONIX) 40 MG tablet Take 1 tablet (40 mg total) by mouth 2 (two) times daily before a meal.  4  . predniSONE (DELTASONE) 20 MG tablet Take 2 tablets (40 mg total) by mouth daily with breakfast. 8 tablet 0  . sertraline (ZOLOFT) 50 MG tablet Take 1 tablet (50 mg total) by mouth daily. --- Please establish with new PCP for further refills 90 tablet 0  . traZODone (DESYREL) 50 MG tablet Take 1-2 tablets (50-100 mg total) by mouth at bedtime as needed for sleep. 60 tablet 3  . warfarin (COUMADIN) 3 MG tablet TAKE ONE AND ONE-HALF TO  TWO TABLETS DAILY AS DIRECTED BY COUMADIN CLINIC 180 tablet 1   No current facility-administered medications for this visit.    Past Medical History  Diagnosis Date  . BPH (benign prostatic hypertrophy)   . Spinal stenosis     [place]  . Hypertension   . Asthma   . DVT (deep venous thrombosis) ([place])     a. 2010 Lower ext s/p back surgery.  . Pulmonary embolism ([place])     a. 2010 in setting of DVT post-op back surgery. b. Low prob VQ 07/2013.  [name] [name] PAF (paroxysmal atrial fibrillation) ([place])     a. Flecainide discontinued 07/2013 in setting of CAD.; b. s/p PVI Ablation at [facility] (Dr Joseph Berkshire) 11/2014  . Ulcerative proctitis ([employer]) 08/25/2011  . COPD (chronic  obstructive pulmonary disease) ([place])     a. 07/2013 PFT's mild airflow obstruction, no restriction, sev decrease in DLCO.  [name] [name] GERD (gastroesophageal reflux disease)   . Diverticulosis   . Hx of echocardiogram 2015    Echo (06/2013): EF 60-65% normal wall motion, normal diastolic function, aortic sclerosis without stenosis, Trivial MR, mild SAM due to long, redundant mitral leaflets, mild RAE, normal RVSF  . Dyspnea on exertion     a. 07/2013 PFT's mild airflow obstr   . CAD (coronary artery disease)     a. 07/2013: s/p DES to LAD, normal LVF.  . Macrocytic anemia     a. 07/2013: documented on prior labs.  . Obesity   . Transaminitis     a. 07/2013: mild.  . Complication of anesthesia     "I got all kinds of hallucinations"  . Pneumonia ~ 2010 X 1  . History of hiatal hernia   . Arthritis   . History of gout     Past Surgical History  Procedure Laterality Date  . Vasectomy    . Lumbar fusion  02/2008  . Total hip arthroplasty Bilateral   . Cataract extraction w/ intraocular lens  implant, bilateral Bilateral   . Colonoscopy w/ polypectomy  2004  . Joint replacement    . Anterior lumbar disc arthroplasty  03/2008    "spacer poped out; had to repair"  . Coronary angioplasty with stent placement  08/08/2013    "1"  . Left and right heart catheterization with coronary angiogram N/A 08/07/2013    Procedure: LEFT AND RIGHT HEART CATHETERIZATION WITH CORONARY ANGIOGRAM;  Surgeon: Peter M Martinique, MD;  Location: [facility] CATH LAB;  Service: Cardiovascular;  Laterality: N/A;  . Percutaneous coronary stent intervention (pci-s)  08/07/2013    Procedure: PERCUTANEOUS CORONARY STENT INTERVENTION (PCI-S);  Surgeon: Peter M Martinique, MD;  Location: [facility] CATH LAB;  Service: Cardiovascular;;  . Colonoscopy with polypectomy  09/2011    2 tubular adenomas  . Pvi ablation  11/2014    Dr. Venita Sheffield [facility]    ROS:  As stated in the HPI and negative for all other systems.  PHYSICAL EXAM BP 128/84 mmHg   Pulse 82  Ht 6\' 2"  (1.88 m)  Wt 267 lb 14.4 oz (121.519 kg)  BMI 34.38 kg/m2 GENERAL:  Well appearing HEENT:  Pupils equal round and reactive, fundi not visualized, oral mucosa unremarkable NECK:  No jugular venous distention, waveform within normal limits, carotid upstroke brisk and symmetric, no bruits, no thyromegaly LYMPHATICS:  No cervical, inguinal adenopathy LUNGS:  Clear to auscultation bilaterally BACK:  No CVA tenderness CHEST:  Unremarkable HEART:  PMI not displaced or sustained,S1 and S2 within normal limits, no  S3, no S4, no clicks, no rubs, no murmurs ABD:  Flat, positive bowel sounds normal in frequency in pitch, no bruits, no rebound, no guarding, no midline pulsatile mass, no hepatomegaly, no splenomegaly, obese EXT:  2 plus pulses throughout, no edema, no cyanosis no clubbing, rash   ASSESSMENT AND PLAN  ATRIAL FIBRILLATION:   He has had ablation now and no change in therapy is indicated. He's had no recurrent dysrhythmias.  DYSPNEA:  He has had an extensive work up.  Symptoms are chronic and stable.  No change in therapy or further testing is indicated.  I did review the recent CAT scan done at the [facility] clinic.  CAD:  He had a negative stress perfusion study [place] [facility] and has no ongoing chest pain. He'll continue with risk reduction.  HTN:   He has had some orthostatic symptoms in the past.  No change in therapy is planned at this point however.    MR:  He had some mild  MR and mild septal hypertrophy. This will be followed clinically  [facility] records reviewed again today. [name] [name]

## 2015-05-16 ENCOUNTER — Ambulatory Visit (INDEPENDENT_AMBULATORY_CARE_PROVIDER_SITE_OTHER): Payer: Medicare Other | Admitting: Cardiology

## 2015-05-16 ENCOUNTER — Encounter: Payer: Self-pay | Admitting: Cardiology

## 2015-05-16 VITALS — BP 128/84 | HR 82 | Ht 74.0 in | Wt 267.9 lb

## 2015-05-16 DIAGNOSIS — R0600 Dyspnea, unspecified: Secondary | ICD-10-CM

## 2015-05-16 DIAGNOSIS — I2510 Atherosclerotic heart disease of native coronary artery without angina pectoris: Secondary | ICD-10-CM | POA: Diagnosis not present

## 2015-05-16 NOTE — Patient Instructions (Signed)
Dr Percival Spanish recommends that you continue on your current medications as directed. Please refer to the Current Medication list given to you today.  Dr Percival Spanish recommends that you schedule a follow-up appointment in 6 months. You will receive a reminder letter in the mail two months in advance. If you don't receive a letter, please call our office to schedule the follow-up appointment.  If you need a refill on your cardiac medications before your next appointment, please call your pharmacy.

## 2015-05-20 ENCOUNTER — Ambulatory Visit (INDEPENDENT_AMBULATORY_CARE_PROVIDER_SITE_OTHER): Payer: Medicare Other | Admitting: Internal Medicine

## 2015-05-20 ENCOUNTER — Telehealth: Payer: Self-pay | Admitting: Gastroenterology

## 2015-05-20 ENCOUNTER — Encounter: Payer: Self-pay | Admitting: Internal Medicine

## 2015-05-20 ENCOUNTER — Telehealth: Payer: Self-pay | Admitting: Internal Medicine

## 2015-05-20 VITALS — BP 128/70 | HR 74 | Temp 97.6°F | Resp 18 | Wt 271.0 lb

## 2015-05-20 DIAGNOSIS — G4700 Insomnia, unspecified: Secondary | ICD-10-CM

## 2015-05-20 DIAGNOSIS — J441 Chronic obstructive pulmonary disease with (acute) exacerbation: Secondary | ICD-10-CM | POA: Diagnosis not present

## 2015-05-20 DIAGNOSIS — I2510 Atherosclerotic heart disease of native coronary artery without angina pectoris: Secondary | ICD-10-CM | POA: Diagnosis not present

## 2015-05-20 MED ORDER — SUVOREXANT 10 MG PO TABS: 10.0000 mg | ORAL_TABLET | Freq: Every day | ORAL | Status: DC

## 2015-05-20 MED ORDER — HYDROCODONE-HOMATROPINE 5-1.5 MG/5ML PO SYRP: 5.0000 mL | ORAL_SOLUTION | Freq: Three times a day (TID) | ORAL | Status: DC | PRN

## 2015-05-20 MED ORDER — FLUTICASONE FUROATE-VILANTEROL 200-25 MCG/INH IN AEPB: 1.0000 | INHALATION_SPRAY | Freq: Every day | RESPIRATORY_TRACT | Status: DC

## 2015-05-20 NOTE — Patient Instructions (Signed)
  A new prescription for sleep medicine will be sent to your pharmacy.    You received a prescription for hydromet.  Your Breo prescription was sent to your pharmacy.   Call with any questions or concerns.

## 2015-05-20 NOTE — Telephone Encounter (Signed)
As long as the patient understands the risks / benefits and wishes to proceed with the procedure that is fine. Dr. Deatra Ina had previously counseled him on this and it is documented. It is unclear if he is on coumadin for atrial fibrillation or a history of a DVT, or both. He needs to hold it 5 days prior to the procedure but defer to his prescribing provider if he needs a bridge with lovenox. We need to ask the prescribing provider if it's okay to hold without a bridge. Thanks

## 2015-05-20 NOTE — Assessment & Plan Note (Signed)
Trazodone-noneffective Ativan was effective, but he was needing to increase the dose to help him sleep Ambien-noneffective Trial of Belsomra if covered by insurance

## 2015-05-20 NOTE — Telephone Encounter (Signed)
Pt called in said that the sleeping meds that she was giving is over $100.00, he wants to know i there anything else he can get that would be cheaper?

## 2015-05-20 NOTE — Progress Notes (Signed)
Subjective:    Patient ID: [name] [name], male    DOB: 04/09/1945, 70 y.o.   MRN: [id number]  HPI He is here for follow up.   He was seen on 3/6 for a COPD exacerbation due to a respiratory illness. Rapid flu was negative and chest xray was negative for pneumonia.  He was placed on a steroid for 4 days.  He finished the steroids over the weekend.  No antibiotic was given.   He saw cardiology last week for routine follow up and his acute dyspnea improved, but he continues to have chronic dyspnea.  He is still coughing - it is not bad during the day, but is bad at night.  He feels his symptoms are improving.  He would like more prescription cough syrup.    COPD:  He has chronic dyspnea on exertion.  He is following with pulmonary.  I gave him a sample of the high dose Breo to see if that helped more than the lower dose, which he has found helpful.    Afib, CAD, Hypertension: He just saw cardiology last week.  He is taking his medication daily. He is compliant with a low sodium diet.  He denies chest pain, palpitations, edema, shortness of breath and regular headaches. He is exercising regularly.  He does not monitor his blood pressure at home.    Insomnia:  We started him on trazodone last month.  It did not help at all.  He was taking ativan and that worked, but it stopped working as well and he was taking more to help him sleep.  He would like to try something else.    He has lost weight. He has decreased the amount that he is eating and some of the things that he should not be eating. As a result he has lost weight.   Medications and allergies reviewed with patient and updated if appropriate.  Patient Active Problem List   Diagnosis Date Noted  . Prediabetes 04/17/2015  . COPD GOLD II 08/21/2014  . COPD exacerbation ([place]) 08/21/2014  . Anemia 08/20/2014  . Hyperlipidemia 08/20/2014  . Hyperglycemia 08/20/2014  . Spinal stenosis of lumbar region 05/30/2014  . Abnormal CT of the  abdomen 05/30/2014  . Dizziness 02/08/2014  . Macrocytic anemia 08/08/2013  . CAD (coronary artery disease)   . Dyspnea 08/07/2013  . AAA (abdominal aortic aneurysm) without rupture ([place]) 05/02/2013  . Obesity (BMI 30-39.9) 03/21/2013  . Family history of malignant neoplasm of gastrointestinal tract 08/25/2011  . 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 Fatigue 03/24/2011  . DOE (dyspnea on exertion) 03/12/2011  . Warfarin anticoagulation 03/11/2011  . INSOMNIA-SLEEP DISORDER-UNSPEC 09/12/2009  . CERVICAL RADICULOPATHY, RIGHT 07/24/2009  . SNORING, HX OF 07/24/2009  . DEGENERATIVE JOINT DISEASE, ADVANCED 05/21/2009  . AVASCULAR NECROSIS 05/21/2009  . ALLERGIC RHINITIS CAUSE UNSPECIFIED 11/01/2008  . PULMONARY NODULE, RIGHT LOWER LOBE 10/25/2008  . DIVERTICULOSIS, COLON 10/07/2008  . OBSTRUCTIVE CARDIOMYOPATHY 10/05/2008  . PULMONARY EMBOLISM, HX OF 10/05/2008  . EDEMA- LOCALIZED 05/07/2008  . ASTHMA 02/20/2008  . OTHER AND UNSPECIFIED HYPERLIPIDEMIA 09/08/2007  . HYPERPLASIA PROSTATE UNS W/UR OBST & OTH LUTS 06/28/2007  . PAF (paroxysmal atrial fibrillation) ([place]) 06/25/2007  . NOCTURIA 05/12/2007  . COLONIC POLYPS 04/01/2006  . GOUT 04/01/2006  . Essential hypertension 04/01/2006  . ACID REFLUX DISEASE 04/01/2006    Current Outpatient Prescriptions on File Prior to Visit  Medication Sig Dispense Refill  . amLODipine (NORVASC) 5 MG tablet Take 1 tablet  by mouth daily.    [name] aspirin 81 MG chewable tablet Chew 1 tablet by mouth daily.    [name] atorvastatin (LIPITOR) 10 MG tablet Take 1 tablet (10 mg total) by mouth every evening. 90 tablet 1  . Fluticasone Furoate-Vilanterol 100-25 MCG/INH AEPB Inhale 1 puff into the lungs daily. 28 each 0  . nitroGLYCERIN (NITROSTAT) 0.4 MG SL tablet Place 1 tablet (0.4 mg total) under the tongue every 5 (five) minutes as needed for chest pain (up to 3 doses). 25 tablet 4  . pantoprazole (PROTONIX) 40 MG tablet Take 1 tablet (40 mg total) by mouth  2 (two) times daily before a meal.  4  . sertraline (ZOLOFT) 50 MG tablet Take 1 tablet (50 mg total) by mouth daily. --- Please establish with new PCP for further refills 90 tablet 0  . traZODone (DESYREL) 50 MG tablet Take 1-2 tablets (50-100 mg total) by mouth at bedtime as needed for sleep. 60 tablet 3  . warfarin (COUMADIN) 3 MG tablet TAKE ONE AND ONE-HALF TO TWO TABLETS DAILY AS DIRECTED BY COUMADIN CLINIC 180 tablet 1   No current facility-administered medications on file prior to visit.    Past Medical History  Diagnosis Date  . BPH (benign prostatic hypertrophy)   . Spinal stenosis     [place]  . Hypertension   . Asthma   . DVT (deep venous thrombosis) ([place])     a. 2010 Lower ext s/p back surgery.  . Pulmonary embolism ([place])     a. 2010 in setting of DVT post-op back surgery. b. Low prob VQ 07/2013.  [name] PAF (paroxysmal atrial fibrillation) ([place])     a. Flecainide discontinued 07/2013 in setting of CAD.; b. s/p PVI Ablation at [facility] (Dr Joseph Berkshire) 11/2014  . 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 COPD (chronic obstructive pulmonary disease) ([place])     a. 07/2013 PFT's mild airflow obstruction, no restriction, sev decrease in DLCO.  [name] GERD (gastroesophageal reflux disease)   . Diverticulosis   . Hx of echocardiogram 2015    Echo (06/2013): EF 60-65% normal wall motion, normal diastolic function, aortic sclerosis without stenosis, Trivial MR, mild SAM due to long, redundant mitral leaflets, mild RAE, normal RVSF  . Dyspnea on exertion     a. 07/2013 PFT's mild airflow obstr   . CAD (coronary artery disease)     a. 07/2013: s/p DES to LAD, normal LVF.  . Macrocytic anemia     a. 07/2013: documented on prior labs.  . Obesity   . Transaminitis     a. 07/2013: mild.  . Complication of anesthesia     "I got all kinds of hallucinations"  . Pneumonia ~ 2010 X 1  . History of hiatal hernia   . Arthritis   . History of gout     Past Surgical History  Procedure Laterality  Date  . Vasectomy    . Lumbar fusion  02/2008  . Total hip arthroplasty Bilateral   . Cataract extraction w/ intraocular lens  implant, bilateral Bilateral   . Colonoscopy w/ polypectomy  2004  . Joint replacement    . Anterior lumbar disc arthroplasty  03/2008    "spacer poped out; had to repair"  . Coronary angioplasty with stent placement  08/08/2013    "1"  . Left and right heart catheterization with coronary angiogram N/A 08/07/2013    Procedure: LEFT AND RIGHT HEART CATHETERIZATION WITH CORONARY ANGIOGRAM;  Surgeon: Peter M Martinique, MD;  Location: [facility]  CATH LAB;  Service: Cardiovascular;  Laterality: N/A;  . Percutaneous coronary stent intervention (pci-s)  08/07/2013    Procedure: PERCUTANEOUS CORONARY STENT INTERVENTION (PCI-S);  Surgeon: Peter M Martinique, MD;  Location: [facility] CATH LAB;  Service: Cardiovascular;;  . Colonoscopy with polypectomy  09/2011    2 tubular adenomas  . Pvi ablation  11/2014    Dr. Venita Sheffield [facility]    Social History   Social History  . Marital Status: Married    Spouse Name: N/A  . Number of Children: 4  . Years of Education: N/A   Occupational History  . Retired from [employer]  . Smoking status: Former Smoker -- 2.00 packs/day for 40 years    Types: Cigarettes    Quit date: 03/09/2000  . Smokeless tobacco: Never Used     Comment: smoked 1964-2002, up to 3 ppd  . Alcohol Use: 0.0 oz/week    0 Standard drinks or equivalent per week     Comment: 02/08/2014 "drink a couple times/month"  . Drug Use: No  . Sexual Activity: Not on file     Comment: "sexually hx is none of your business" (02/08/2014)   Other Topics Concern  . Not on file   Social History Narrative    Family History  Problem Relation Age of Onset  . Atrial fibrillation Mother   . COPD Mother   . Heart disease Father   . Colon cancer Father   . Heart attack Brother 85  . Alcohol abuse Brother 57    Died post liver transplant  . Stroke Neg Hx   .  Heart attack Paternal Grandfather     Over 34  . Heart attack Paternal Uncle     Less than 55    Review of Systems  Constitutional: Positive for fever (occasionally, 100.5).  HENT: Negative for congestion, sinus pressure and sore throat.   Respiratory: Positive for cough, shortness of breath (at baseline) and wheezing (at baseline).   Cardiovascular: Negative for chest pain and palpitations.  Neurological: Negative for headaches.  Psychiatric/Behavioral: Positive for sleep disturbance.       Objective:   Filed Vitals:   05/20/15 0950  BP: 128/70  Pulse: 74  Temp: 97.6 F (36.4 C)  Resp: 18   Filed Weights   05/20/15 0950  Weight: 271 lb (122.925 kg)   Body mass index is 34.78 kg/(m^2).   Physical Exam Constitutional: Appears well-developed and well-nourished. No distress.  Cardiovascular: Normal rate, regular rhythm and normal heart sounds.   No murmur heard.  No edema Pulmonary/Chest: Effort normal, no respiratory distress. Some rhonchi that mostly cleared with coughing.  Psychiatric: Normal mood and affect        Assessment & Plan:    I congratulated him on his weight loss efforts and encouraged him to continue. See Problem List for Assessment and Plan of chronic medical problems.

## 2015-05-20 NOTE — Assessment & Plan Note (Signed)
Symptoms improved with prednisone, but still having cough and wheezing, shortness of breath Prescription cough syrup refilled Continue Breo daily Call if symptoms do not continue to improve

## 2015-05-20 NOTE — Progress Notes (Signed)
Pre visit review using our clinic review tool, if applicable. No additional management support is needed unless otherwise documented below in the visit note. 

## 2015-05-20 NOTE — Telephone Encounter (Signed)
Doc of the Day Office policy is that he is seen before scheduling. He was originally scheduled for 12/27/14 and he had seen Dr Deatra Ina. Please advise on if you prefer to see him first. The patient is on blood thinner.

## 2015-05-20 NOTE — Assessment & Plan Note (Signed)
Asymptomatic Following with cardiology Stable Continue current medications

## 2015-05-20 NOTE — Telephone Encounter (Signed)
Please advise 

## 2015-05-21 ENCOUNTER — Other Ambulatory Visit: Payer: Self-pay

## 2015-05-21 NOTE — Telephone Encounter (Signed)
I have him scheduled for 06/25/15 for a 9:30 procedure time. This is a good date because he will be seen at the Coumadin clinic on 06/17/15.Can you contact him and set up the pre-visit? I will contact the cardiologist for documentation on the Coumadin.

## 2015-05-25 ENCOUNTER — Ambulatory Visit: Payer: Medicare Other | Admitting: Adult Health

## 2015-05-27 ENCOUNTER — Ambulatory Visit: Payer: Medicare Other | Admitting: Family

## 2015-05-27 ENCOUNTER — Encounter: Payer: Self-pay | Admitting: Family

## 2015-05-31 ENCOUNTER — Encounter: Payer: Self-pay | Admitting: Family

## 2015-05-31 ENCOUNTER — Other Ambulatory Visit: Payer: Self-pay | Admitting: *Deleted

## 2015-05-31 ENCOUNTER — Ambulatory Visit (INDEPENDENT_AMBULATORY_CARE_PROVIDER_SITE_OTHER): Payer: Medicare Other | Admitting: Family

## 2015-05-31 ENCOUNTER — Ambulatory Visit (HOSPITAL_COMMUNITY)
Admission: RE | Admit: 2015-05-31 | Discharge: 2015-05-31 | Disposition: A | Discharge status: Home / Self Care | Payer: Medicare Other | Source: Ambulatory Visit | Attending: Family | Admitting: Family

## 2015-05-31 VITALS — BP 113/79 | HR 86 | Temp 97.3°F | Resp 16

## 2015-05-31 DIAGNOSIS — E669 Obesity, unspecified: Secondary | ICD-10-CM | POA: Insufficient documentation

## 2015-05-31 DIAGNOSIS — J449 Chronic obstructive pulmonary disease, unspecified: Secondary | ICD-10-CM | POA: Insufficient documentation

## 2015-05-31 DIAGNOSIS — K219 Gastro-esophageal reflux disease without esophagitis: Secondary | ICD-10-CM | POA: Diagnosis not present

## 2015-05-31 DIAGNOSIS — I714 Abdominal aortic aneurysm, without rupture, unspecified: Secondary | ICD-10-CM

## 2015-05-31 DIAGNOSIS — I10 Essential (primary) hypertension: Secondary | ICD-10-CM | POA: Insufficient documentation

## 2015-05-31 DIAGNOSIS — I2510 Atherosclerotic heart disease of native coronary artery without angina pectoris: Secondary | ICD-10-CM | POA: Diagnosis not present

## 2015-05-31 DIAGNOSIS — Z6834 Body mass index (BMI) 34.0-34.9, adult: Secondary | ICD-10-CM | POA: Insufficient documentation

## 2015-05-31 NOTE — Progress Notes (Signed)
[facility] [place]  Established Abdominal Aortic Aneurysm  History of Present Illness  [name] [name] is a 70 y.o. ([birth year]) male patient of Dr. Kellie Simmering, initkally referred by Dr. 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 Patient went to the emergency department recently at [facility] because of left flank discomfort. Thorough evaluation included CT scan of the abdomen. This revealed a 3.2 cm elevation of the terminal aorta with no evidence of dissection or leakage. His flank pain disappeared within 7 days and has not reoccurred. He is seen today for evaluation of a small aneurysm. He denies any previous history of similar pain on either side. He has had lumbar spine surgery in the past and occasionally has moderate low back pain, non radiating; he denies abdominal pain.  He has numbness in his left calf to foot that started after one of his back surgeries, has not changed.  Pt states he has a chronic cough from COPD, denies that his dyspnea or cough is any worse than usual.  The patient denies claudication in legs with walking. The patient denies history of stroke or TIA symptoms.  Pt Diabetic: No Pt smoker: former smoker, quit in 2002  Past Medical History  Diagnosis Date  . BPH (benign prostatic hypertrophy)   . Spinal stenosis     [place]  . Hypertension   . Asthma   . DVT (deep venous thrombosis) ([place])     a. 2010 Lower ext s/p back surgery.  . Pulmonary embolism ([place])     a. 2010 in setting of DVT post-op back surgery. b. Low prob VQ 07/2013.  [name] PAF (paroxysmal atrial fibrillation) ([place])     a. Flecainide discontinued 07/2013 in setting of CAD.; b. s/p PVI Ablation at [facility] (Dr Joseph Berkshire) 11/2014  . 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 COPD (chronic obstructive pulmonary disease) ([place])     a. 07/2013 PFT's mild airflow obstruction, no restriction, sev decrease in DLCO.  [name] GERD (gastroesophageal reflux disease)   .  Diverticulosis   . Hx of echocardiogram 2015    Echo (06/2013): EF 60-65% normal wall motion, normal diastolic function, aortic sclerosis without stenosis, Trivial MR, mild SAM due to long, redundant mitral leaflets, mild RAE, normal RVSF  . Dyspnea on exertion     a. 07/2013 PFT's mild airflow obstr   . CAD (coronary artery disease)     a. 07/2013: s/p DES to LAD, normal LVF.  . Macrocytic anemia     a. 07/2013: documented on prior labs.  . Obesity   . Transaminitis     a. 07/2013: mild.  . Complication of anesthesia     "I got all kinds of hallucinations"  . Pneumonia ~ 2010 X 1  . History of hiatal hernia   . Arthritis   . History of gout    Past Surgical History  Procedure Laterality Date  . Vasectomy    . Lumbar fusion  02/2008  . Total hip arthroplasty Bilateral   . Cataract extraction w/ intraocular lens  implant, bilateral Bilateral   . Colonoscopy w/ polypectomy  2004  . Joint replacement    . Anterior lumbar disc arthroplasty  03/2008    "spacer poped out; had to repair"  . Coronary angioplasty with stent placement  08/08/2013    "1"  . Left and right heart catheterization with coronary angiogram N/A 08/07/2013    Procedure: LEFT AND RIGHT HEART CATHETERIZATION WITH CORONARY ANGIOGRAM;  Surgeon: Ander Slade  Martinique, MD;  Location: [facility] CATH LAB;  Service: Cardiovascular;  Laterality: N/A;  . Percutaneous coronary stent intervention (pci-s)  08/07/2013    Procedure: PERCUTANEOUS CORONARY STENT INTERVENTION (PCI-S);  Surgeon: Peter M Martinique, MD;  Location: [facility] CATH LAB;  Service: Cardiovascular;;  . Colonoscopy with polypectomy  09/2011    2 tubular adenomas  . Pvi ablation  11/2014    Dr. Venita Sheffield [facility]   Social History Social History   Social History  . Marital Status: Married    Spouse Name: N/A  . Number of Children: 4  . Years of Education: N/A   Occupational History  . Retired from [employer]  . Smoking status: Former Smoker --  2.00 packs/day for 40 years    Types: Cigarettes    Quit date: 03/09/2000  . Smokeless tobacco: Never Used     Comment: smoked 1964-2002, up to 3 ppd  . Alcohol Use: 0.0 oz/week    0 Standard drinks or equivalent per week     Comment: 02/08/2014 "drink a couple times/month"  . Drug Use: No  . Sexual Activity: Not on file     Comment: "sexually hx is none of your business" (02/08/2014)   Other Topics Concern  . Not on file   Social History Narrative   Family History Family History  Problem Relation Age of Onset  . Atrial fibrillation Mother   . COPD Mother   . Heart disease Father   . Colon cancer Father   . Heart attack Brother 48  . Alcohol abuse Brother 39    Died post liver transplant  . Stroke Neg Hx   . Heart attack Paternal Grandfather     Over 60  . Heart attack Paternal Uncle     Less than 55    Current Outpatient Prescriptions on File Prior to Visit  Medication Sig Dispense Refill  . amLODipine (NORVASC) 5 MG tablet Take 1 tablet by mouth daily.    [name] aspirin 81 MG chewable tablet Chew 1 tablet by mouth daily.    [name] atorvastatin (LIPITOR) 10 MG tablet Take 1 tablet (10 mg total) by mouth every evening. 90 tablet 1  . nitroGLYCERIN (NITROSTAT) 0.4 MG SL tablet Place 1 tablet (0.4 mg total) under the tongue every 5 (five) minutes as needed for chest pain (up to 3 doses). 25 tablet 4  . pantoprazole (PROTONIX) 40 MG tablet Take 1 tablet (40 mg total) by mouth 2 (two) times daily before a meal.  4  . sertraline (ZOLOFT) 50 MG tablet Take 1 tablet (50 mg total) by mouth daily. --- Please establish with new PCP for further refills 90 tablet 0  . Suvorexant (BELSOMRA) 10 MG TABS Take 10 mg by mouth at bedtime. 30 tablet 3  . warfarin (COUMADIN) 3 MG tablet TAKE ONE AND ONE-HALF TO TWO TABLETS DAILY AS DIRECTED BY COUMADIN CLINIC 180 tablet 1  . HYDROcodone-homatropine (HYCODAN) 5-1.5 MG/5ML syrup Take 5 mLs by mouth every 8 (eight) hours as needed for cough. (Patient not  taking: Reported on 05/31/2015) 120 mL 0   No current facility-administered medications on file prior to visit.   Allergies  Allergen Reactions  . Penicillins Anaphylaxis     Because of a history of documented adverse serious drug reaction;Medi Alert bracelet  is recommended    ROS: See HPI for pertinent positives and negatives.  Physical Examination  Filed Vitals:   05/31/15 0915  BP: 113/79  Pulse: 86  Temp: 97.3 F (36.3 C)  TempSrc: Oral  Resp: 16  SpO2: 95%   There is no weight on file to calculate BMI.  General: A&O x 3, WD, obese male.  Pulmonary: Sym exp, fair air movt, CTAB except rales and wheezes in right posterior fields, moist cough.  Cardiac: RRR, Nl S1, S2, no detected murmur.   Carotid Bruits Right Left   Negative Negative   Aorta is not palpable Radial pulses are 2+ palpable and =                          VASCULAR EXAM:                                                                                                         LE Pulses Right Left       FEMORAL   palpable   palpable        POPLITEAL  not palpable   not palpable       POSTERIOR TIBIAL  not palpable   not palpable        DORSALIS PEDIS      ANTERIOR TIBIAL  palpable   palpable      Gastrointestinal: soft, NTND, -G/R, - HSM, - masses palpated, - CVAT B.  Musculoskeletal: M/S 5/5 throughout, Extremities without ischemic changes.  Neurologic: CN 2-12 intact, Pain and light touch intact in extremities are intact except, Motor exam as listed above.  Non-Invasive Vascular Imaging  AAA Duplex (05/31/2015)  Previous size: 3 cm (CT) (Date: 05/22/14)  Current size: 3.15 cm (Date: 05/31/2015)  Medical Decision Making  The patient is a 70 y.o. male who presents with asymptomatic AAA with no increase in size.   Based on this patient's exam and diagnostic studies, the patient will follow up in 2 years  with the following studies: AAA duplex.  Consideration for repair of AAA would  be made when the size is 5.5 cm, growth > 1 cm/yr, and symptomatic status.  I emphasized the importance of maximal medical management including strict control of blood pressure, blood glucose, and lipid levels, antiplatelet agents, obtaining regular exercise, and continued cessation of smoking.   The patient was given information about AAA including signs, symptoms, treatment, and how to minimize the risk of enlargement and rupture of aneurysms.    The patient was advised to call 911 should the patient experience sudden onset abdominal or back pain.   Thank you for allowing [place] to participate in this patient's care.  Clemon Chambers, RN, MSN, FNP-C [facility] [place] Office: [phone]  Clinic Physician: Bridgett Larsson  05/31/2015, 9:40 AM

## 2015-05-31 NOTE — Patient Instructions (Signed)
Abdominal Aortic Aneurysm An aneurysm is a weakened or damaged part of an artery wall that bulges from the normal force of blood pumping through the body. An abdominal aortic aneurysm is an aneurysm that occurs in the lower part of the aorta, the main artery of the body.  The major concern with an abdominal aortic aneurysm is that it can enlarge and burst (rupture) or blood can flow between the layers of the wall of the aorta through a tear (aorticdissection). Both of these conditions can cause bleeding inside the body and can be life threatening unless diagnosed and treated promptly. CAUSES  The exact cause of an abdominal aortic aneurysm is unknown. Some contributing factors are:   A hardening of the arteries caused by the buildup of fat and other substances in the lining of a blood vessel (arteriosclerosis).  Inflammation of the walls of an artery (arteritis).   Connective tissue diseases, such as Marfan syndrome.   Abdominal trauma.   An infection, such as syphilis or staphylococcus, in the wall of the aorta (infectious aortitis) caused by bacteria. RISK FACTORS  Risk factors that contribute to an abdominal aortic aneurysm may include:  Age older than 60 years.   High blood pressure (hypertension).  Male gender.  Ethnicity (white race).  Obesity.  Family history of aneurysm (first degree relatives only).  Tobacco use. PREVENTION  The following healthy lifestyle habits may help decrease your risk of abdominal aortic aneurysm:  Quitting smoking. Smoking can raise your blood pressure and cause arteriosclerosis.  Limiting or avoiding alcohol.  Keeping your blood pressure, blood sugar level, and cholesterol levels within normal limits.  Decreasing your salt intake. In somepeople, too much salt can raise blood pressure and increase your risk of abdominal aortic aneurysm.  Eating a diet low in saturated fats and cholesterol.  Increasing your fiber intake by including  whole grains, vegetables, and fruits in your diet. Eating these foods may help lower blood pressure.  Maintaining a healthy weight.  Staying physically active and exercising regularly. SYMPTOMS  The symptoms of abdominal aortic aneurysm may vary depending on the size and rate of growth of the aneurysm.Most grow slowly and do not have any symptoms. When symptoms do occur, they may include:  Pain (abdomen, side, lower back, or groin). The pain may vary in intensity. A sudden onset of severe pain may indicate that the aneurysm has ruptured.  Feeling full after eating only small amounts of food.  Nausea or vomiting or both.  Feeling a pulsating lump in the abdomen.  Feeling faint or passing out. DIAGNOSIS  Since most unruptured abdominal aortic aneurysms have no symptoms, they are often discovered during diagnostic exams for other conditions. An aneurysm may be found during the following procedures:  Ultrasonography (A one-time screening for abdominal aortic aneurysm by ultrasonography is also recommended for all men aged 65-75 years who have ever smoked).  X-ray exams.  A computed tomography (CT).  Magnetic resonance imaging (MRI).  Angiography or arteriography. TREATMENT  Treatment of an abdominal aortic aneurysm depends on the size of your aneurysm, your age, and risk factors for rupture. Medication to control blood pressure and pain may be used to manage aneurysms smaller than 6 cm. Regular monitoring for enlargement may be recommended by your caregiver if:  The aneurysm is 3-4 cm in size (an annual ultrasonography may be recommended).  The aneurysm is 4-4.5 cm in size (an ultrasonography every 6 months may be recommended).  The aneurysm is larger than 4.5 cm in   size (your caregiver may ask that you be examined by a vascular surgeon). If your aneurysm is larger than 6 cm, surgical repair may be recommended. There are two main methods for repair of an aneurysm:   Endovascular  repair (a minimally invasive surgery). This is done most often.  Open repair. This method is used if an endovascular repair is not possible.   This information is not intended to replace advice given to you by your health care provider. Make sure you discuss any questions you have with your health care provider.   Document Released: 12/03/2004 Document Revised: 06/20/2012 Document Reviewed: 03/25/2012 Elsevier Interactive Patient Education 2016 [employer] Inc[employer]  

## 2015-06-03 ENCOUNTER — Other Ambulatory Visit: Payer: Self-pay | Admitting: Internal Medicine

## 2015-06-03 ENCOUNTER — Other Ambulatory Visit: Payer: Self-pay | Admitting: Cardiology

## 2015-06-17 ENCOUNTER — Ambulatory Visit: Payer: Medicare Other

## 2015-06-17 ENCOUNTER — Ambulatory Visit (INDEPENDENT_AMBULATORY_CARE_PROVIDER_SITE_OTHER): Payer: Medicare Other | Admitting: Pharmacist Clinician (PhC)/ Clinical Pharmacy Specialist

## 2015-06-17 VITALS — Ht 74.0 in | Wt 270.0 lb

## 2015-06-17 DIAGNOSIS — I480 Paroxysmal atrial fibrillation: Secondary | ICD-10-CM | POA: Diagnosis not present

## 2015-06-17 DIAGNOSIS — Z86718 Personal history of other venous thrombosis and embolism: Secondary | ICD-10-CM

## 2015-06-17 DIAGNOSIS — I4891 Unspecified atrial fibrillation: Secondary | ICD-10-CM | POA: Diagnosis not present

## 2015-06-17 DIAGNOSIS — Z860100 Personal history of colon polyps, unspecified: Secondary | ICD-10-CM

## 2015-06-17 LAB — POCT INR: INR: 3.1

## 2015-06-17 NOTE — Progress Notes (Signed)
No allergies to eggs or soy No past problems with anesthesia No home oxygen No diet/weight loss meds  Has email and internet; refused emmi 

## 2015-06-25 ENCOUNTER — Ambulatory Visit (AMBULATORY_SURGERY_CENTER): Payer: Medicare Other | Admitting: Gastroenterology

## 2015-06-25 ENCOUNTER — Encounter: Payer: Self-pay | Admitting: Gastroenterology

## 2015-06-25 VITALS — BP 108/62 | HR 57 | Temp 96.0°F | Resp 22 | Ht 74.0 in | Wt 270.0 lb

## 2015-06-25 DIAGNOSIS — Z1211 Encounter for screening for malignant neoplasm of colon: Secondary | ICD-10-CM | POA: Diagnosis not present

## 2015-06-25 DIAGNOSIS — Z860100 Personal history of colon polyps, unspecified: Secondary | ICD-10-CM

## 2015-06-25 DIAGNOSIS — D123 Benign neoplasm of transverse colon: Secondary | ICD-10-CM

## 2015-06-25 DIAGNOSIS — E669 Obesity, unspecified: Secondary | ICD-10-CM | POA: Diagnosis not present

## 2015-06-25 DIAGNOSIS — K635 Polyp of colon: Secondary | ICD-10-CM | POA: Diagnosis not present

## 2015-06-25 DIAGNOSIS — J45909 Unspecified asthma, uncomplicated: Secondary | ICD-10-CM | POA: Diagnosis not present

## 2015-06-25 DIAGNOSIS — I421 Obstructive hypertrophic cardiomyopathy: Secondary | ICD-10-CM | POA: Diagnosis not present

## 2015-06-25 MED ORDER — SODIUM CHLORIDE 0.9 % IV SOLN: 500.0000 mL | INTRAVENOUS | Status: DC

## 2015-06-25 NOTE — Progress Notes (Signed)
Pt toward end of procedure started coughing - BBS tight with wheezing, coughing suctioned, increased 02 to 6 liters - supportive care, allowing pt to cough, ordered breathing treatment in RR. Received 1000 cc IVF in preop and 300 cc NS in procedure room. Pt awake alert coughing O2 on in RR at 3 Liter flow

## 2015-06-25 NOTE — Patient Instructions (Signed)
YOU HAD AN ENDOSCOPIC PROCEDURE TODAY AT [facility]:   Refer to the procedure report that was given to you for any specific questions about what was found during the examination.  If the procedure report does not answer your questions, please call your gastroenterologist to clarify.  If you requested that your care partner not be given the details of your procedure findings, then the procedure report has been included in a sealed envelope for you to review at your convenience later.  YOU SHOULD EXPECT: Some feelings of bloating in the abdomen. Passage of more gas than usual.  Walking can help get rid of the air that was put into your GI tract during the procedure and reduce the bloating. If you had a lower endoscopy (such as a colonoscopy or flexible sigmoidoscopy) you may notice spotting of blood in your stool or on the toilet paper. If you underwent a bowel prep for your procedure, you may not have a normal bowel movement for a few days.  Please Note:  You might notice some irritation and congestion in your nose or some drainage.  This is from the oxygen used during your procedure.  There is no need for concern and it should clear up in a day or so.  SYMPTOMS TO REPORT IMMEDIATELY:   Following lower endoscopy (colonoscopy or flexible sigmoidoscopy):  Excessive amounts of blood in the stool  Significant tenderness or worsening of abdominal pains  Swelling of the abdomen that is new, acute  Fever of 100F or higher   For urgent or emergent issues, a gastroenterologist can be reached at any hour by calling [phone].   DIET: Your first meal following the procedure should be a small meal and then it is ok to progress to your normal diet. Heavy or fried foods are harder to digest and may make you feel nauseous or bloated.  Likewise, meals heavy in dairy and vegetables can increase bloating.  Drink plenty of fluids but you should avoid alcoholic beverages for 24  hours.  ACTIVITY:  You should plan to take it easy for the rest of today and you should NOT DRIVE or use heavy machinery until tomorrow (because of the sedation medicines used during the test).    FOLLOW UP: Our staff will call the number listed on your records the next business day following your procedure to check on you and address any questions or concerns that you may have regarding the information given to you following your procedure. If we do not reach you, we will leave a message.  However, if you are feeling well and you are not experiencing any problems, there is no need to return our call.  We will assume that you have returned to your regular daily activities without incident.  If any biopsies were taken you will be contacted by phone or by letter within the next 1-3 weeks.  Please call us at [phone] if you have not heard about the biopsies in 3 weeks.    SIGNATURES/CONFIDENTIALITY: You and/or your care partner have signed paperwork which will be entered into your electronic medical record.  These signatures attest to the fact that that the information above on your After Visit Summary has been reviewed and is understood.  Full responsibility of the confidentiality of this discharge information lies with you and/or your care-partner.  Polyp, diverticulosis, high fiber diet and hemorrhoid information given.  Resume coumadin tonight.

## 2015-06-25 NOTE — Progress Notes (Signed)
Called to room to assist during endoscopic procedure.  Patient ID and intended procedure confirmed with present staff. Received instructions for my participation in the procedure from the performing physician.  

## 2015-06-25 NOTE — Progress Notes (Signed)
Albuterol breathing treatment begun upon arrival to recovery room.

## 2015-06-25 NOTE — Progress Notes (Signed)
Dr. Havery Moros in to see pt prior to discharge.  Breathing well on room air.  Dr. Havery Moros advised using inhaler when he arrives home.  To call if any problems.

## 2015-06-25 NOTE — Op Note (Signed)
[name] [name] Patient Name: [name] [name] Procedure Date: 06/25/2015 9:45 AM MRN: [id number] Endoscopist: Remo Lipps P. Ulysses Alper MD, MD Age: 70 Date of Birth: 1945/04/15 Gender: Male Procedure:                Colonoscopy Indications:              High risk colon cancer surveillance: Personal                            history of colonic polyps. Last exam 4 years ago                            limited by poor prep. Medicines:                Monitored Anesthesia Care Procedure:                Pre-Anesthesia Assessment:                           - Prior to the procedure, a History and Physical                            was performed, and patient medications and                            allergies were reviewed. The patient's tolerance of                            previous anesthesia was also reviewed. The risks                            and benefits of the procedure and the sedation                            options and risks were discussed with the patient.                            All questions were answered, and informed consent                            was obtained. Prior Anticoagulants: The patient                            last took Coumadin (warfarin) 6 days prior to the                            procedure. ASA Grade Assessment: III - A patient                            with severe systemic disease. After reviewing the                            risks and benefits, the patient was deemed in  satisfactory condition to undergo the procedure.                           After obtaining informed consent, the colonoscope                            was passed under direct vision. Throughout the                            procedure, the patient's blood pressure, pulse, and                            oxygen saturations were monitored continuously. The                            Model CF-HQ190L [id number]) scope was introduced         through the anus and advanced to the the cecum,                            identified by appendiceal orifice and ileocecal                            valve. The colonoscopy was performed without                            difficulty. The patient tolerated the procedure                            well. The quality of the bowel preparation was                            fair. The ileocecal valve, appendiceal orifice, and                            rectum were photographed. Scope In: 9:54:10 AM Scope Out: 10:24:33 AM Scope Withdrawal Time: 0 hours 24 minutes 41 seconds  Total Procedure Duration: 0 hours 30 minutes 23 seconds  Findings:                 The perianal and digital rectal examinations were                            normal.                           Two sessile polyps were found in the hepatic                            flexure. The polyps were 3 to 4 mm in size. These                            polyps were removed with a cold biopsy forceps.  Resection and retrieval were complete.                           Two sessile polyps were found in the transverse                            colon. The polyps were 3 to 4 mm in size. These                            polyps were removed with a cold biopsy forceps.                            Resection and retrieval were complete.                           A few small-mouthed diverticula were found in the                            sigmoid colon.                           Non-bleeding internal hemorrhoids were found during                            retroflexion. The hemorrhoids were small.                           The exam was otherwise normal throughout the                            examined colon. Of note the bowel preparation was                            only fair. I took a significant amount of time to                            lavage the colon to obtain adequate views. The                             patient had a significant amount of coughing during                            the procedure and had a hard time retaining air in                            this light, leading to a prolonged procedure. Complications:            No immediate complications. Estimated blood loss:                            Minimal. Estimated Blood Loss:     Estimated blood loss was minimal. Impression:               - Preparation of the colon was fair but following  lavage adequate views were obtained.                           - Two 3 to 4 mm polyps at the hepatic flexure,                            removed with a cold biopsy forceps. Resected and                            retrieved.                           - Two 3 to 4 mm polyps in the transverse colon,                            removed with a cold biopsy forceps. Resected and                            retrieved.                           - Diverticulosis in the sigmoid colon.                           - Non-bleeding internal hemorrhoids. Recommendation:           - Patient has a contact number available for                            emergencies. The signs and symptoms of potential                            delayed complications were discussed with the                            patient. Return to normal activities tomorrow.                            Written discharge instructions were provided to the                            patient.                           - Resume previous diet.                           - Continue present medications.                           - Resume coumadin tonight                           - Await pathology results.                           - Repeat colonoscopy is recommended for  surveillance. The colonoscopy date will be                            determined after pathology results from today's                            exam become available for review. Remo Lipps  P. Wandy Bossler MD, MD 06/25/2015 10:33:33 AM This report has been signed electronically.

## 2015-06-26 ENCOUNTER — Telehealth: Payer: Self-pay

## 2015-06-26 NOTE — Telephone Encounter (Signed)
  Follow up Call-  Call back number 06/25/2015  Post procedure Call Back phone  # [phone]  Permission to leave phone message Yes     Patient questions:  Do you have a fever, pain , or abdominal swelling? No. Pain Score  0 *  Have you tolerated food without any problems? Yes.    Have you been able to return to your normal activities? Yes.    Do you have any questions about your discharge instructions: Diet   No. Medications  No. Follow up visit  No.  Do you have questions or concerns about your Care? No.  Actions: * If pain score is 4 or above: No action needed, pain <4.

## 2015-07-02 ENCOUNTER — Encounter: Payer: Self-pay | Admitting: Gastroenterology

## 2015-07-15 DIAGNOSIS — Z9889 Other specified postprocedural states: Secondary | ICD-10-CM | POA: Diagnosis not present

## 2015-07-15 DIAGNOSIS — I951 Orthostatic hypotension: Secondary | ICD-10-CM | POA: Diagnosis not present

## 2015-07-15 DIAGNOSIS — Z8679 Personal history of other diseases of the circulatory system: Secondary | ICD-10-CM | POA: Diagnosis not present

## 2015-07-15 DIAGNOSIS — I428 Other cardiomyopathies: Secondary | ICD-10-CM | POA: Diagnosis not present

## 2015-07-15 DIAGNOSIS — I480 Paroxysmal atrial fibrillation: Secondary | ICD-10-CM | POA: Diagnosis not present

## 2015-07-15 DIAGNOSIS — R0602 Shortness of breath: Secondary | ICD-10-CM | POA: Diagnosis not present

## 2015-07-15 DIAGNOSIS — J449 Chronic obstructive pulmonary disease, unspecified: Secondary | ICD-10-CM | POA: Diagnosis not present

## 2015-07-15 DIAGNOSIS — Z955 Presence of coronary angioplasty implant and graft: Secondary | ICD-10-CM | POA: Diagnosis not present

## 2015-07-15 DIAGNOSIS — I10 Essential (primary) hypertension: Secondary | ICD-10-CM | POA: Diagnosis not present

## 2015-07-15 DIAGNOSIS — I481 Persistent atrial fibrillation: Secondary | ICD-10-CM | POA: Diagnosis not present

## 2015-07-15 DIAGNOSIS — R001 Bradycardia, unspecified: Secondary | ICD-10-CM | POA: Diagnosis not present

## 2015-07-15 DIAGNOSIS — E782 Mixed hyperlipidemia: Secondary | ICD-10-CM | POA: Diagnosis not present

## 2015-07-15 DIAGNOSIS — I2510 Atherosclerotic heart disease of native coronary artery without angina pectoris: Secondary | ICD-10-CM | POA: Diagnosis not present

## 2015-07-15 DIAGNOSIS — Z87898 Personal history of other specified conditions: Secondary | ICD-10-CM | POA: Diagnosis not present

## 2015-07-15 DIAGNOSIS — E6609 Other obesity due to excess calories: Secondary | ICD-10-CM | POA: Diagnosis not present

## 2015-07-15 DIAGNOSIS — Z7901 Long term (current) use of anticoagulants: Secondary | ICD-10-CM | POA: Diagnosis not present

## 2015-07-17 DIAGNOSIS — I480 Paroxysmal atrial fibrillation: Secondary | ICD-10-CM | POA: Diagnosis not present

## 2015-07-17 DIAGNOSIS — Z9889 Other specified postprocedural states: Secondary | ICD-10-CM | POA: Diagnosis not present

## 2015-07-17 DIAGNOSIS — E782 Mixed hyperlipidemia: Secondary | ICD-10-CM | POA: Diagnosis not present

## 2015-07-17 DIAGNOSIS — R0602 Shortness of breath: Secondary | ICD-10-CM | POA: Diagnosis not present

## 2015-07-17 DIAGNOSIS — I428 Other cardiomyopathies: Secondary | ICD-10-CM | POA: Diagnosis not present

## 2015-07-17 DIAGNOSIS — Z9861 Coronary angioplasty status: Secondary | ICD-10-CM | POA: Diagnosis not present

## 2015-07-17 DIAGNOSIS — Z8679 Personal history of other diseases of the circulatory system: Secondary | ICD-10-CM | POA: Diagnosis not present

## 2015-07-17 DIAGNOSIS — R001 Bradycardia, unspecified: Secondary | ICD-10-CM | POA: Diagnosis not present

## 2015-07-17 DIAGNOSIS — I2510 Atherosclerotic heart disease of native coronary artery without angina pectoris: Secondary | ICD-10-CM | POA: Diagnosis not present

## 2015-07-17 DIAGNOSIS — Z955 Presence of coronary angioplasty implant and graft: Secondary | ICD-10-CM | POA: Diagnosis not present

## 2015-07-17 DIAGNOSIS — I10 Essential (primary) hypertension: Secondary | ICD-10-CM | POA: Diagnosis not present

## 2015-07-17 DIAGNOSIS — I422 Other hypertrophic cardiomyopathy: Secondary | ICD-10-CM | POA: Diagnosis not present

## 2015-07-17 DIAGNOSIS — E6609 Other obesity due to excess calories: Secondary | ICD-10-CM | POA: Diagnosis not present

## 2015-07-17 DIAGNOSIS — J449 Chronic obstructive pulmonary disease, unspecified: Secondary | ICD-10-CM | POA: Diagnosis not present

## 2015-07-24 DIAGNOSIS — I422 Other hypertrophic cardiomyopathy: Secondary | ICD-10-CM | POA: Diagnosis not present

## 2015-07-24 DIAGNOSIS — I480 Paroxysmal atrial fibrillation: Secondary | ICD-10-CM | POA: Diagnosis not present

## 2015-07-24 DIAGNOSIS — I481 Persistent atrial fibrillation: Secondary | ICD-10-CM | POA: Diagnosis not present

## 2015-07-29 ENCOUNTER — Telehealth: Payer: Self-pay

## 2015-07-29 NOTE — Telephone Encounter (Signed)
Pt's spouse call stated [facility] hospital just waiting for a call from our office (written release already sign at their office) to request record from heart specialist Dr. Joseph Berkshire and Dr. Alesia Richards for all last month record. Please call today we will have the record for the appt tomorrow.

## 2015-07-29 NOTE — Telephone Encounter (Signed)
Patient called and said you had to call the clinic he was at to have them release the records to [place] that Dr.Burns wants. The phone number is : [phone] Patient record number: [id number]

## 2015-07-29 NOTE — Telephone Encounter (Signed)
[facility], will have records faxed over as soon as they can.

## 2015-07-29 NOTE — Telephone Encounter (Signed)
Are there curtain records you are needing?

## 2015-07-30 ENCOUNTER — Ambulatory Visit (INDEPENDENT_AMBULATORY_CARE_PROVIDER_SITE_OTHER): Payer: Medicare Other | Admitting: Internal Medicine

## 2015-07-30 ENCOUNTER — Encounter: Payer: Self-pay | Admitting: Internal Medicine

## 2015-07-30 VITALS — BP 122/70 | HR 76 | Temp 98.2°F | Ht 74.0 in | Wt 269.2 lb

## 2015-07-30 DIAGNOSIS — I480 Paroxysmal atrial fibrillation: Secondary | ICD-10-CM | POA: Diagnosis not present

## 2015-07-30 DIAGNOSIS — I2510 Atherosclerotic heart disease of native coronary artery without angina pectoris: Secondary | ICD-10-CM | POA: Diagnosis not present

## 2015-07-30 DIAGNOSIS — I421 Obstructive hypertrophic cardiomyopathy: Secondary | ICD-10-CM

## 2015-07-30 DIAGNOSIS — I10 Essential (primary) hypertension: Secondary | ICD-10-CM

## 2015-07-30 DIAGNOSIS — R0683 Snoring: Secondary | ICD-10-CM

## 2015-07-30 NOTE — Progress Notes (Signed)
Subjective:    Patient ID: [name] [name], male    DOB: 1945-05-15, 70 y.o.   MRN: [id number]  HPI He is here for follow up.  Last year he had an ablation for his Afib and has not had any recurrent Afib.  He had 4 episodes of Afib he thinks in the past month.  It occurred when he was walking - he felt lightheaded/dizzy and very fatigued.  It would last less than 3 minutes.  He never had chest pain or palpitations, but never had these symptoms when he had Afib initially.  He went to back to [facility] clinic for follow up.  He had tests done.  He has a heart monitor on for the next month.  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  He also has a mild leaky valve.  They are trying to determine if he has been going into Afib intermittently and that is the cause of his symptoms. They will discuss treatment options at that time.   Cardiology recommends re-evaluation for sleep apnea.  He was tested in the past.   He feels sob and fatigued with exertion this month.  He is afraid to exercise regularly  Mole on neck becoming thicker.  He is concerned and wonders if he needs to have it evaluated.   Left hand changes.  He has noticed a bump that is not tender on the palm of his left hand.  It looks like there is an area of swelling in the palm as well.  He denies pain or trigger finger.    Medications and allergies reviewed with patient and updated if appropriate.  Patient Active Problem List   Diagnosis Date Noted  . Prediabetes 04/17/2015  . COPD GOLD II 08/21/2014  . COPD exacerbation ([place]) 08/21/2014  . Anemia 08/20/2014  . Hyperlipidemia 08/20/2014  . Hyperglycemia 08/20/2014  . Spinal stenosis of lumbar region 05/30/2014  . Abnormal CT of the abdomen 05/30/2014  . Dizziness 02/08/2014  . Macrocytic anemia 08/08/2013  . CAD (coronary artery disease)   . Dyspnea 08/07/2013  . AAA (abdominal aortic aneurysm) without rupture ([place]) 05/02/2013  . Obesity (BMI  30-39.9) 03/21/2013  . Family history of malignant neoplasm of gastrointestinal tract 08/25/2011  . 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 Fatigue 03/24/2011  . DOE (dyspnea on exertion) 03/12/2011  . Warfarin anticoagulation 03/11/2011  . INSOMNIA-SLEEP DISORDER-UNSPEC 09/12/2009  . CERVICAL RADICULOPATHY, RIGHT 07/24/2009  . SNORING, HX OF 07/24/2009  . DEGENERATIVE JOINT DISEASE, ADVANCED 05/21/2009  . AVASCULAR NECROSIS 05/21/2009  . ALLERGIC RHINITIS CAUSE UNSPECIFIED 11/01/2008  . PULMONARY NODULE, RIGHT LOWER LOBE 10/25/2008  . DIVERTICULOSIS, COLON 10/07/2008  . OBSTRUCTIVE CARDIOMYOPATHY 10/05/2008  . PULMONARY EMBOLISM, HX OF 10/05/2008  . EDEMA- LOCALIZED 05/07/2008  . ASTHMA 02/20/2008  . OTHER AND UNSPECIFIED HYPERLIPIDEMIA 09/08/2007  . HYPERPLASIA PROSTATE UNS W/UR OBST & OTH LUTS 06/28/2007  . PAF (paroxysmal atrial fibrillation) ([place]) 06/25/2007  . NOCTURIA 05/12/2007  . COLONIC POLYPS 04/01/2006  . GOUT 04/01/2006  . Essential hypertension 04/01/2006  . ACID REFLUX DISEASE 04/01/2006    Current Outpatient Prescriptions on File Prior to Visit  Medication Sig Dispense Refill  . amLODipine (NORVASC) 5 MG tablet Take 1 tablet by mouth daily.    [name] aspirin 81 MG chewable tablet Chew 1 tablet by mouth daily.    [name] atorvastatin (LIPITOR) 10 MG tablet Take 1 tablet (10 mg total) by mouth every evening. 90 tablet 1  . pantoprazole (PROTONIX)  40 MG tablet Take 1 tablet (40 mg total) by mouth 2 (two) times daily before a meal. (Patient taking differently: Take 40 mg by mouth daily. )  4  . sertraline (ZOLOFT) 50 MG tablet Take 1 tablet (50 mg total) by mouth daily. 90 tablet 1  . traZODone (DESYREL) 50 MG tablet Reported on 05/31/2015    . warfarin (COUMADIN) 3 MG tablet TAKE ONE AND ONE-HALF TO TWO TABLETS DAILY AS DIRECTED BY COUMADIN CLINIC 180 tablet 1  . nitroGLYCERIN (NITROSTAT) 0.4 MG SL tablet Place 1 tablet (0.4 mg total) under the tongue every 5 (five) minutes as  needed for chest pain (up to 3 doses). (Patient not taking: Reported on 07/30/2015) 25 tablet 4   No current facility-administered medications on file prior to visit.    Past Medical History  Diagnosis Date  . BPH (benign prostatic hypertrophy)   . Spinal stenosis     [place]  . Hypertension   . Asthma   . DVT (deep venous thrombosis) ([place])     a. 2010 Lower ext s/p back surgery.  . Pulmonary embolism ([place])     a. 2010 in setting of DVT post-op back surgery. b. Low prob VQ 07/2013.  [name] PAF (paroxysmal atrial fibrillation) ([place])     a. Flecainide discontinued 07/2013 in setting of CAD.; b. s/p PVI Ablation at [facility] (Dr Joseph Berkshire) 11/2014  . 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 COPD (chronic obstructive pulmonary disease) ([place])     a. 07/2013 PFT's mild airflow obstruction, no restriction, sev decrease in DLCO.  [name] GERD (gastroesophageal reflux disease)   . Diverticulosis   . Hx of echocardiogram 2015    Echo (06/2013): EF 60-65% normal wall motion, normal diastolic function, aortic sclerosis without stenosis, Trivial MR, mild SAM due to long, redundant mitral leaflets, mild RAE, normal RVSF  . Dyspnea on exertion     a. 07/2013 PFT's mild airflow obstr   . CAD (coronary artery disease)     a. 07/2013: s/p DES to LAD, normal LVF.  . Macrocytic anemia     a. 07/2013: documented on prior labs.  . Obesity   . Transaminitis     a. 07/2013: mild.  . Complication of anesthesia     "I got all kinds of hallucinations"  . Pneumonia ~ 2010 X 1  . History of hiatal hernia   . Arthritis   . History of gout     Past Surgical History  Procedure Laterality Date  . Vasectomy    . Lumbar fusion  02/2008  . Total hip arthroplasty Bilateral   . Cataract extraction w/ intraocular lens  implant, bilateral Bilateral   . Colonoscopy w/ polypectomy  2004  . Joint replacement    . Anterior lumbar disc arthroplasty  03/2008    "spacer poped out; had to repair"  . Coronary angioplasty with  stent placement  08/08/2013    "1"  . Left and right heart catheterization with coronary angiogram N/A 08/07/2013    Procedure: LEFT AND RIGHT HEART CATHETERIZATION WITH CORONARY ANGIOGRAM;  Surgeon: Peter M Martinique, MD;  Location: [facility] CATH LAB;  Service: Cardiovascular;  Laterality: N/A;  . Percutaneous coronary stent intervention (pci-s)  08/07/2013    Procedure: PERCUTANEOUS CORONARY STENT INTERVENTION (PCI-S);  Surgeon: Peter M Martinique, MD;  Location: [facility] CATH LAB;  Service: Cardiovascular;;  . Colonoscopy with polypectomy  09/2011    2 tubular adenomas  . Pvi ablation  11/2014    Dr. Venita Sheffield -  [place] History   Social History  . Marital Status: Married    Spouse Name: N/A  . Number of Children: 4  . Years of Education: N/A   Occupational History  . Retired from [employer]  . Smoking status: Former Smoker -- 2.00 packs/day for 40 years    Types: Cigarettes    Quit date: 03/09/2000  . Smokeless tobacco: Never Used     Comment: smoked 1964-2002, up to 3 ppd  . Alcohol Use: 0.0 oz/week    0 Standard drinks or equivalent per week     Comment: 02/08/2014 "drink a couple times/month"  . Drug Use: No  . Sexual Activity: Not Asked     Comment: "sexually hx is none of your business" (02/08/2014)   Other Topics Concern  . None   Social History Narrative    Family History  Problem Relation Age of Onset  . Atrial fibrillation Mother   . COPD Mother   . Heart disease Father   . Colon cancer Father   . Heart attack Brother 48  . Alcohol abuse Brother 28    Died post liver transplant  . Stroke Neg Hx   . Heart attack Paternal Grandfather     Over 86  . Heart attack Paternal Uncle     Less than 55    Review of Systems  Respiratory: Positive for cough (dry, throat clearing) and shortness of breath. Negative for wheezing.   Cardiovascular: Positive for leg swelling (left ankle occaisonal). Negative for chest pain and palpitations.    Neurological: Positive for dizziness and light-headedness. Negative for headaches.       Objective:   Filed Vitals:   07/30/15 1435  BP: 122/70  Pulse: 76  Temp: 98.2 F (36.8 C)   Filed Weights   07/30/15 1435  Weight: 269 lb 4 oz (122.131 kg)   Body mass index is 34.55 kg/(m^2).   Physical Exam Constitutional: Appears well-developed and well-nourished. No distress.  Neck: Neck supple. No tracheal deviation present. No thyromegaly present.  No carotid bruit. No cervical adenopathy.   Cardiovascular: Normal rate, regular rhythm and normal heart sounds.   No murmur heard.  No edema Pulmonary/Chest: Effort normal and breath sounds normal. No respiratory distress. No wheezes.  Hands: left hand with mild contractures and calcifications of tendons, no swelling or concerning abnormalities Skin: normal appearing mole right side of neck     Assessment & Plan:   Sleep study ordered   Reassured regarding mole, but I did advise a derm evaluation for a complete skin check since it has changed to be on the safe side   See Problem List for Assessment and Plan of chronic medical problems.

## 2015-07-30 NOTE — Assessment & Plan Note (Addendum)
Echo 11/29/13: mild septal hypertrophy, EF 60% No change in 2017 Mild, no change - unlikely the cause of his symptoms Following with Dr Percival Spanish and cardiology at [facility]

## 2015-07-30 NOTE — Assessment & Plan Note (Signed)
Having lightheadedness/dizziness and fatigue with exertion - possible Afib Wearing a holter for one month - will return to [place] clinic and determine treatment depending on findings Continue current meds for now

## 2015-07-30 NOTE — Assessment & Plan Note (Signed)
BP well controlled Current regimen effective and well tolerated Continue current medications at current doses  

## 2015-07-30 NOTE — Progress Notes (Signed)
Pre visit review using our clinic review tool, if applicable. No additional management support is needed unless otherwise documented below in the visit note. 

## 2015-07-30 NOTE — Patient Instructions (Addendum)
    Medications reviewed and updated.  No changes recommended at this time.   A sleep apnea test was ordered.

## 2015-08-01 ENCOUNTER — Ambulatory Visit (INDEPENDENT_AMBULATORY_CARE_PROVIDER_SITE_OTHER): Payer: Medicare Other | Admitting: Pharmacist

## 2015-08-01 DIAGNOSIS — Z86718 Personal history of other venous thrombosis and embolism: Secondary | ICD-10-CM | POA: Diagnosis not present

## 2015-08-01 DIAGNOSIS — I480 Paroxysmal atrial fibrillation: Secondary | ICD-10-CM

## 2015-08-01 DIAGNOSIS — I4891 Unspecified atrial fibrillation: Secondary | ICD-10-CM | POA: Diagnosis not present

## 2015-08-01 LAB — POCT INR: INR: 1.4

## 2015-08-06 ENCOUNTER — Other Ambulatory Visit: Payer: Self-pay | Admitting: Internal Medicine

## 2015-08-06 DIAGNOSIS — R0683 Snoring: Secondary | ICD-10-CM

## 2015-08-09 ENCOUNTER — Telehealth: Payer: Self-pay | Admitting: Internal Medicine

## 2015-08-09 DIAGNOSIS — I480 Paroxysmal atrial fibrillation: Secondary | ICD-10-CM

## 2015-08-09 DIAGNOSIS — I2510 Atherosclerotic heart disease of native coronary artery without angina pectoris: Secondary | ICD-10-CM

## 2015-08-09 DIAGNOSIS — I10 Essential (primary) hypertension: Secondary | ICD-10-CM

## 2015-08-09 DIAGNOSIS — R7303 Prediabetes: Secondary | ICD-10-CM

## 2015-08-09 DIAGNOSIS — E785 Hyperlipidemia, unspecified: Secondary | ICD-10-CM

## 2015-08-09 NOTE — Telephone Encounter (Signed)
Please advise 

## 2015-08-09 NOTE — Telephone Encounter (Signed)
Pt called request referral for dietrition. Pt was advise from heart doctor due to his heart condition, loss weight might help improve his health. Please advise.

## 2015-08-11 NOTE — Telephone Encounter (Signed)
ordered

## 2015-08-12 NOTE — Telephone Encounter (Signed)
Spoke with pt to inform.  

## 2015-08-14 DIAGNOSIS — I480 Paroxysmal atrial fibrillation: Secondary | ICD-10-CM | POA: Diagnosis not present

## 2015-08-26 ENCOUNTER — Ambulatory Visit (INDEPENDENT_AMBULATORY_CARE_PROVIDER_SITE_OTHER): Payer: Medicare Other | Admitting: Pharmacist

## 2015-08-26 DIAGNOSIS — I4891 Unspecified atrial fibrillation: Secondary | ICD-10-CM

## 2015-08-26 DIAGNOSIS — Z86718 Personal history of other venous thrombosis and embolism: Secondary | ICD-10-CM | POA: Diagnosis not present

## 2015-08-26 DIAGNOSIS — I480 Paroxysmal atrial fibrillation: Secondary | ICD-10-CM

## 2015-08-26 LAB — POCT INR: INR: 2.4

## 2015-08-28 DIAGNOSIS — I2510 Atherosclerotic heart disease of native coronary artery without angina pectoris: Secondary | ICD-10-CM | POA: Diagnosis present

## 2015-08-28 DIAGNOSIS — D649 Anemia, unspecified: Secondary | ICD-10-CM | POA: Diagnosis not present

## 2015-08-28 DIAGNOSIS — R5382 Chronic fatigue, unspecified: Secondary | ICD-10-CM | POA: Diagnosis present

## 2015-08-28 DIAGNOSIS — Z86711 Personal history of pulmonary embolism: Secondary | ICD-10-CM | POA: Diagnosis not present

## 2015-08-28 DIAGNOSIS — R112 Nausea with vomiting, unspecified: Secondary | ICD-10-CM | POA: Diagnosis not present

## 2015-08-28 DIAGNOSIS — M79671 Pain in right foot: Secondary | ICD-10-CM | POA: Diagnosis not present

## 2015-08-28 DIAGNOSIS — R0789 Other chest pain: Secondary | ICD-10-CM | POA: Diagnosis not present

## 2015-08-28 DIAGNOSIS — Z955 Presence of coronary angioplasty implant and graft: Secondary | ICD-10-CM | POA: Diagnosis not present

## 2015-08-28 DIAGNOSIS — D539 Nutritional anemia, unspecified: Secondary | ICD-10-CM | POA: Diagnosis present

## 2015-08-28 DIAGNOSIS — R000 Tachycardia, unspecified: Secondary | ICD-10-CM | POA: Diagnosis not present

## 2015-08-28 DIAGNOSIS — R0602 Shortness of breath: Secondary | ICD-10-CM | POA: Diagnosis not present

## 2015-08-28 DIAGNOSIS — J181 Lobar pneumonia, unspecified organism: Secondary | ICD-10-CM | POA: Diagnosis not present

## 2015-08-28 DIAGNOSIS — Z87891 Personal history of nicotine dependence: Secondary | ICD-10-CM | POA: Diagnosis not present

## 2015-08-28 DIAGNOSIS — J9601 Acute respiratory failure with hypoxia: Secondary | ICD-10-CM | POA: Diagnosis not present

## 2015-08-28 DIAGNOSIS — R61 Generalized hyperhidrosis: Secondary | ICD-10-CM | POA: Diagnosis not present

## 2015-08-28 DIAGNOSIS — R079 Chest pain, unspecified: Secondary | ICD-10-CM | POA: Diagnosis not present

## 2015-08-28 DIAGNOSIS — Z7982 Long term (current) use of aspirin: Secondary | ICD-10-CM | POA: Diagnosis not present

## 2015-08-28 DIAGNOSIS — R9431 Abnormal electrocardiogram [ECG] [EKG]: Secondary | ICD-10-CM | POA: Diagnosis not present

## 2015-08-28 DIAGNOSIS — I4891 Unspecified atrial fibrillation: Secondary | ICD-10-CM | POA: Diagnosis not present

## 2015-08-28 DIAGNOSIS — J189 Pneumonia, unspecified organism: Secondary | ICD-10-CM | POA: Diagnosis not present

## 2015-08-28 DIAGNOSIS — J9690 Respiratory failure, unspecified, unspecified whether with hypoxia or hypercapnia: Secondary | ICD-10-CM | POA: Diagnosis not present

## 2015-08-28 DIAGNOSIS — M109 Gout, unspecified: Secondary | ICD-10-CM | POA: Diagnosis not present

## 2015-08-28 DIAGNOSIS — Z96649 Presence of unspecified artificial hip joint: Secondary | ICD-10-CM | POA: Diagnosis present

## 2015-08-28 DIAGNOSIS — J440 Chronic obstructive pulmonary disease with acute lower respiratory infection: Secondary | ICD-10-CM | POA: Diagnosis present

## 2015-08-28 DIAGNOSIS — I272 Other secondary pulmonary hypertension: Secondary | ICD-10-CM | POA: Diagnosis present

## 2015-08-28 DIAGNOSIS — I77810 Thoracic aortic ectasia: Secondary | ICD-10-CM | POA: Diagnosis not present

## 2015-08-28 DIAGNOSIS — M79602 Pain in left arm: Secondary | ICD-10-CM | POA: Diagnosis not present

## 2015-08-28 DIAGNOSIS — I10 Essential (primary) hypertension: Secondary | ICD-10-CM | POA: Diagnosis present

## 2015-08-28 DIAGNOSIS — Z7901 Long term (current) use of anticoagulants: Secondary | ICD-10-CM | POA: Diagnosis not present

## 2015-08-28 DIAGNOSIS — Z880 Allergy status to penicillin: Secondary | ICD-10-CM | POA: Diagnosis not present

## 2015-08-28 DIAGNOSIS — Z9981 Dependence on supplemental oxygen: Secondary | ICD-10-CM | POA: Diagnosis not present

## 2015-09-05 ENCOUNTER — Telehealth: Payer: Self-pay | Admitting: Emergency Medicine

## 2015-09-05 NOTE — Telephone Encounter (Signed)
FYI: Spoke with pts wife today, pt went to [facility] to have dental work done by daughter. Pt is currently in the hospital for Pneumonia, UTI, and Sepsis. Pts wife is not sure when they will be able to come home// discharged.

## 2015-09-06 DIAGNOSIS — J410 Simple chronic bronchitis: Secondary | ICD-10-CM | POA: Diagnosis not present

## 2015-09-06 DIAGNOSIS — E538 Deficiency of other specified B group vitamins: Secondary | ICD-10-CM | POA: Diagnosis not present

## 2015-09-06 DIAGNOSIS — J189 Pneumonia, unspecified organism: Secondary | ICD-10-CM | POA: Diagnosis not present

## 2015-09-06 DIAGNOSIS — R918 Other nonspecific abnormal finding of lung field: Secondary | ICD-10-CM | POA: Diagnosis not present

## 2015-09-06 DIAGNOSIS — I2782 Chronic pulmonary embolism: Secondary | ICD-10-CM | POA: Diagnosis not present

## 2015-09-06 DIAGNOSIS — D7589 Other specified diseases of blood and blood-forming organs: Secondary | ICD-10-CM | POA: Diagnosis not present

## 2015-09-06 DIAGNOSIS — M109 Gout, unspecified: Secondary | ICD-10-CM | POA: Diagnosis not present

## 2015-09-06 DIAGNOSIS — I481 Persistent atrial fibrillation: Secondary | ICD-10-CM | POA: Diagnosis not present

## 2015-09-06 DIAGNOSIS — J9811 Atelectasis: Secondary | ICD-10-CM | POA: Diagnosis not present

## 2015-09-06 NOTE — Telephone Encounter (Signed)
noted 

## 2015-09-11 ENCOUNTER — Other Ambulatory Visit: Payer: Self-pay | Admitting: Cardiology

## 2015-09-11 NOTE — Telephone Encounter (Signed)
Rx Refill

## 2015-09-12 DIAGNOSIS — Z96649 Presence of unspecified artificial hip joint: Secondary | ICD-10-CM | POA: Diagnosis not present

## 2015-09-12 DIAGNOSIS — Z7982 Long term (current) use of aspirin: Secondary | ICD-10-CM | POA: Diagnosis not present

## 2015-09-12 DIAGNOSIS — M1990 Unspecified osteoarthritis, unspecified site: Secondary | ICD-10-CM | POA: Diagnosis not present

## 2015-09-12 DIAGNOSIS — Z7901 Long term (current) use of anticoagulants: Secondary | ICD-10-CM | POA: Diagnosis not present

## 2015-09-12 DIAGNOSIS — I2510 Atherosclerotic heart disease of native coronary artery without angina pectoris: Secondary | ICD-10-CM | POA: Diagnosis not present

## 2015-09-12 DIAGNOSIS — Z9181 History of falling: Secondary | ICD-10-CM | POA: Diagnosis not present

## 2015-09-12 DIAGNOSIS — J189 Pneumonia, unspecified organism: Secondary | ICD-10-CM | POA: Diagnosis not present

## 2015-09-12 DIAGNOSIS — I10 Essential (primary) hypertension: Secondary | ICD-10-CM | POA: Diagnosis not present

## 2015-09-12 DIAGNOSIS — J440 Chronic obstructive pulmonary disease with acute lower respiratory infection: Secondary | ICD-10-CM | POA: Diagnosis not present

## 2015-09-12 DIAGNOSIS — E538 Deficiency of other specified B group vitamins: Secondary | ICD-10-CM | POA: Diagnosis not present

## 2015-09-12 DIAGNOSIS — R32 Unspecified urinary incontinence: Secondary | ICD-10-CM | POA: Diagnosis not present

## 2015-09-12 DIAGNOSIS — Z86711 Personal history of pulmonary embolism: Secondary | ICD-10-CM | POA: Diagnosis not present

## 2015-09-12 DIAGNOSIS — R2689 Other abnormalities of gait and mobility: Secondary | ICD-10-CM | POA: Diagnosis not present

## 2015-09-12 DIAGNOSIS — M109 Gout, unspecified: Secondary | ICD-10-CM | POA: Diagnosis not present

## 2015-09-12 DIAGNOSIS — Z5181 Encounter for therapeutic drug level monitoring: Secondary | ICD-10-CM | POA: Diagnosis not present

## 2015-09-12 DIAGNOSIS — I4891 Unspecified atrial fibrillation: Secondary | ICD-10-CM | POA: Diagnosis not present

## 2015-09-12 DIAGNOSIS — Z87891 Personal history of nicotine dependence: Secondary | ICD-10-CM | POA: Diagnosis not present

## 2015-09-12 DIAGNOSIS — I739 Peripheral vascular disease, unspecified: Secondary | ICD-10-CM | POA: Diagnosis not present

## 2015-09-12 DIAGNOSIS — Z792 Long term (current) use of antibiotics: Secondary | ICD-10-CM | POA: Diagnosis not present

## 2015-09-12 DIAGNOSIS — Z955 Presence of coronary angioplasty implant and graft: Secondary | ICD-10-CM | POA: Diagnosis not present

## 2015-09-13 DIAGNOSIS — I482 Chronic atrial fibrillation: Secondary | ICD-10-CM | POA: Diagnosis not present

## 2015-09-13 DIAGNOSIS — J449 Chronic obstructive pulmonary disease, unspecified: Secondary | ICD-10-CM | POA: Diagnosis not present

## 2015-09-13 DIAGNOSIS — I10 Essential (primary) hypertension: Secondary | ICD-10-CM | POA: Diagnosis not present

## 2015-09-13 DIAGNOSIS — I480 Paroxysmal atrial fibrillation: Secondary | ICD-10-CM | POA: Diagnosis not present

## 2015-09-13 DIAGNOSIS — I481 Persistent atrial fibrillation: Secondary | ICD-10-CM | POA: Diagnosis not present

## 2015-09-13 DIAGNOSIS — E785 Hyperlipidemia, unspecified: Secondary | ICD-10-CM | POA: Diagnosis not present

## 2015-09-13 DIAGNOSIS — J439 Emphysema, unspecified: Secondary | ICD-10-CM | POA: Diagnosis not present

## 2015-09-13 DIAGNOSIS — J410 Simple chronic bronchitis: Secondary | ICD-10-CM | POA: Diagnosis not present

## 2015-09-15 DIAGNOSIS — I4891 Unspecified atrial fibrillation: Secondary | ICD-10-CM | POA: Diagnosis not present

## 2015-09-15 DIAGNOSIS — M109 Gout, unspecified: Secondary | ICD-10-CM | POA: Diagnosis not present

## 2015-09-15 DIAGNOSIS — I10 Essential (primary) hypertension: Secondary | ICD-10-CM | POA: Diagnosis not present

## 2015-09-15 DIAGNOSIS — R2689 Other abnormalities of gait and mobility: Secondary | ICD-10-CM | POA: Diagnosis not present

## 2015-09-15 DIAGNOSIS — J440 Chronic obstructive pulmonary disease with acute lower respiratory infection: Secondary | ICD-10-CM | POA: Diagnosis not present

## 2015-09-15 DIAGNOSIS — J189 Pneumonia, unspecified organism: Secondary | ICD-10-CM | POA: Diagnosis not present

## 2015-09-16 DIAGNOSIS — M109 Gout, unspecified: Secondary | ICD-10-CM | POA: Diagnosis not present

## 2015-09-16 DIAGNOSIS — I4891 Unspecified atrial fibrillation: Secondary | ICD-10-CM | POA: Diagnosis not present

## 2015-09-16 DIAGNOSIS — R2689 Other abnormalities of gait and mobility: Secondary | ICD-10-CM | POA: Diagnosis not present

## 2015-09-16 DIAGNOSIS — J440 Chronic obstructive pulmonary disease with acute lower respiratory infection: Secondary | ICD-10-CM | POA: Diagnosis not present

## 2015-09-16 DIAGNOSIS — J189 Pneumonia, unspecified organism: Secondary | ICD-10-CM | POA: Diagnosis not present

## 2015-09-16 DIAGNOSIS — I10 Essential (primary) hypertension: Secondary | ICD-10-CM | POA: Diagnosis not present

## 2015-09-20 DIAGNOSIS — M109 Gout, unspecified: Secondary | ICD-10-CM | POA: Diagnosis not present

## 2015-09-20 DIAGNOSIS — I4891 Unspecified atrial fibrillation: Secondary | ICD-10-CM | POA: Diagnosis not present

## 2015-09-20 DIAGNOSIS — R2689 Other abnormalities of gait and mobility: Secondary | ICD-10-CM | POA: Diagnosis not present

## 2015-09-20 DIAGNOSIS — J440 Chronic obstructive pulmonary disease with acute lower respiratory infection: Secondary | ICD-10-CM | POA: Diagnosis not present

## 2015-09-20 DIAGNOSIS — I10 Essential (primary) hypertension: Secondary | ICD-10-CM | POA: Diagnosis not present

## 2015-09-20 DIAGNOSIS — J189 Pneumonia, unspecified organism: Secondary | ICD-10-CM | POA: Diagnosis not present

## 2015-09-25 DIAGNOSIS — R2689 Other abnormalities of gait and mobility: Secondary | ICD-10-CM | POA: Diagnosis not present

## 2015-09-25 DIAGNOSIS — J440 Chronic obstructive pulmonary disease with acute lower respiratory infection: Secondary | ICD-10-CM | POA: Diagnosis not present

## 2015-09-25 DIAGNOSIS — I4891 Unspecified atrial fibrillation: Secondary | ICD-10-CM | POA: Diagnosis not present

## 2015-09-25 DIAGNOSIS — J189 Pneumonia, unspecified organism: Secondary | ICD-10-CM | POA: Diagnosis not present

## 2015-09-25 DIAGNOSIS — M109 Gout, unspecified: Secondary | ICD-10-CM | POA: Diagnosis not present

## 2015-09-25 DIAGNOSIS — I10 Essential (primary) hypertension: Secondary | ICD-10-CM | POA: Diagnosis not present

## 2015-09-27 DIAGNOSIS — J410 Simple chronic bronchitis: Secondary | ICD-10-CM | POA: Diagnosis not present

## 2015-09-27 DIAGNOSIS — J189 Pneumonia, unspecified organism: Secondary | ICD-10-CM | POA: Diagnosis not present

## 2015-09-27 DIAGNOSIS — I481 Persistent atrial fibrillation: Secondary | ICD-10-CM | POA: Diagnosis not present

## 2015-09-30 DIAGNOSIS — M109 Gout, unspecified: Secondary | ICD-10-CM | POA: Diagnosis not present

## 2015-09-30 DIAGNOSIS — J440 Chronic obstructive pulmonary disease with acute lower respiratory infection: Secondary | ICD-10-CM | POA: Diagnosis not present

## 2015-09-30 DIAGNOSIS — J189 Pneumonia, unspecified organism: Secondary | ICD-10-CM | POA: Diagnosis not present

## 2015-09-30 DIAGNOSIS — I4891 Unspecified atrial fibrillation: Secondary | ICD-10-CM | POA: Diagnosis not present

## 2015-09-30 DIAGNOSIS — R2689 Other abnormalities of gait and mobility: Secondary | ICD-10-CM | POA: Diagnosis not present

## 2015-09-30 DIAGNOSIS — I10 Essential (primary) hypertension: Secondary | ICD-10-CM | POA: Diagnosis not present

## 2015-10-01 DIAGNOSIS — J439 Emphysema, unspecified: Secondary | ICD-10-CM | POA: Diagnosis not present

## 2015-10-01 DIAGNOSIS — J449 Chronic obstructive pulmonary disease, unspecified: Secondary | ICD-10-CM | POA: Diagnosis not present

## 2015-10-01 DIAGNOSIS — I481 Persistent atrial fibrillation: Secondary | ICD-10-CM | POA: Diagnosis not present

## 2015-10-02 DIAGNOSIS — J440 Chronic obstructive pulmonary disease with acute lower respiratory infection: Secondary | ICD-10-CM | POA: Diagnosis not present

## 2015-10-02 DIAGNOSIS — R2689 Other abnormalities of gait and mobility: Secondary | ICD-10-CM | POA: Diagnosis not present

## 2015-10-02 DIAGNOSIS — I4891 Unspecified atrial fibrillation: Secondary | ICD-10-CM | POA: Diagnosis not present

## 2015-10-02 DIAGNOSIS — I10 Essential (primary) hypertension: Secondary | ICD-10-CM | POA: Diagnosis not present

## 2015-10-02 DIAGNOSIS — M109 Gout, unspecified: Secondary | ICD-10-CM | POA: Diagnosis not present

## 2015-10-02 DIAGNOSIS — J189 Pneumonia, unspecified organism: Secondary | ICD-10-CM | POA: Diagnosis not present

## 2015-10-09 ENCOUNTER — Ambulatory Visit: Payer: Medicare Other | Admitting: Internal Medicine

## 2015-10-10 DIAGNOSIS — R2689 Other abnormalities of gait and mobility: Secondary | ICD-10-CM | POA: Diagnosis not present

## 2015-10-10 DIAGNOSIS — M109 Gout, unspecified: Secondary | ICD-10-CM | POA: Diagnosis not present

## 2015-10-10 DIAGNOSIS — J189 Pneumonia, unspecified organism: Secondary | ICD-10-CM | POA: Diagnosis not present

## 2015-10-10 DIAGNOSIS — J440 Chronic obstructive pulmonary disease with acute lower respiratory infection: Secondary | ICD-10-CM | POA: Diagnosis not present

## 2015-10-10 DIAGNOSIS — I10 Essential (primary) hypertension: Secondary | ICD-10-CM | POA: Diagnosis not present

## 2015-10-10 DIAGNOSIS — I4891 Unspecified atrial fibrillation: Secondary | ICD-10-CM | POA: Diagnosis not present

## 2015-10-16 ENCOUNTER — Other Ambulatory Visit: Payer: Self-pay

## 2015-10-16 MED ORDER — PANTOPRAZOLE SODIUM 40 MG PO TBEC: 40.0000 mg | DELAYED_RELEASE_TABLET | Freq: Every day | ORAL | 1 refills | Status: DC

## 2015-10-18 ENCOUNTER — Ambulatory Visit: Payer: Medicare Other | Admitting: Internal Medicine

## 2015-10-21 ENCOUNTER — Ambulatory Visit (INDEPENDENT_AMBULATORY_CARE_PROVIDER_SITE_OTHER): Payer: Medicare Other | Admitting: Pharmacist

## 2015-10-21 DIAGNOSIS — I4891 Unspecified atrial fibrillation: Secondary | ICD-10-CM

## 2015-10-21 DIAGNOSIS — I480 Paroxysmal atrial fibrillation: Secondary | ICD-10-CM | POA: Diagnosis not present

## 2015-10-21 DIAGNOSIS — Z86718 Personal history of other venous thrombosis and embolism: Secondary | ICD-10-CM

## 2015-10-21 LAB — POCT INR: INR: 3.2

## 2015-10-22 ENCOUNTER — Ambulatory Visit: Payer: Medicare Other | Admitting: Internal Medicine

## 2015-10-22 NOTE — Progress Notes (Deleted)
Subjective:    Patient ID: [name] [name], male    DOB: 1946-02-19, 70 y.o.   MRN: [id number]  HPI He is here for follow up for his hospitalization in [place] for PNA , UTI, sepsis.  [facility] in the setting of a new right lower lobe pneumonia. He does have a history of mild COPD. He was placed on oxygen, but was weaned off during this hospitalization. He was treated with Levaquin. While in the hospital he had a possible gout attack, which she has experienced in the past. The gout flare was in his fifth toe on his right foot. He did receive steroids.  He has CAD and atrial fibrillation, but those remained stable during his hospitalization.  Medications and allergies reviewed with patient and updated if appropriate.  Patient Active Problem List   Diagnosis Date Noted  . Prediabetes 04/17/2015  . COPD GOLD II 08/21/2014  . COPD exacerbation ([place]) 08/21/2014  . Anemia 08/20/2014  . Hyperlipidemia 08/20/2014  . Hyperglycemia 08/20/2014  . Spinal stenosis of lumbar region 05/30/2014  . Abnormal CT of the abdomen 05/30/2014  . Dizziness 02/08/2014  . Macrocytic anemia 08/08/2013  . CAD (coronary artery disease)   . Dyspnea 08/07/2013  . AAA (abdominal aortic aneurysm) without rupture ([place]) 05/02/2013  . Obesity (BMI 30-39.9) 03/21/2013  . Family history of malignant neoplasm of gastrointestinal tract 08/25/2011  . 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 Fatigue 03/24/2011  . DOE (dyspnea on exertion) 03/12/2011  . Warfarin anticoagulation 03/11/2011  . INSOMNIA-SLEEP DISORDER-UNSPEC 09/12/2009  . CERVICAL RADICULOPATHY, RIGHT 07/24/2009  . SNORING, HX OF 07/24/2009  . DEGENERATIVE JOINT DISEASE, ADVANCED 05/21/2009  . AVASCULAR NECROSIS 05/21/2009  . ALLERGIC RHINITIS CAUSE UNSPECIFIED 11/01/2008  . PULMONARY NODULE, RIGHT LOWER LOBE 10/25/2008  . DIVERTICULOSIS, COLON 10/07/2008  . Hypertrophic  obstructive cardiomyopathy ([place]) 10/05/2008  . PULMONARY EMBOLISM, HX OF 10/05/2008  . EDEMA- LOCALIZED 05/07/2008  . ASTHMA 02/20/2008  . OTHER AND UNSPECIFIED HYPERLIPIDEMIA 09/08/2007  . HYPERPLASIA PROSTATE UNS W/UR OBST & OTH LUTS 06/28/2007  . PAF (paroxysmal atrial fibrillation) ([place]) 06/25/2007  . NOCTURIA 05/12/2007  . COLONIC POLYPS 04/01/2006  . GOUT 04/01/2006  . Essential hypertension 04/01/2006  . ACID REFLUX DISEASE 04/01/2006    Current Outpatient Prescriptions on File Prior to Visit  Medication Sig Dispense Refill  . amLODipine (NORVASC) 5 MG tablet Take 1 tablet by mouth daily.    [name] aspirin 81 MG chewable tablet Chew 1 tablet by mouth daily.    [name] atorvastatin (LIPITOR) 10 MG tablet TAKE 1 TABLET EVERY EVENING 90 tablet 0  . fluticasone furoate-vilanterol (BREO ELLIPTA) 200-25 MCG/INH AEPB Inhale into the lungs.    . nitroGLYCERIN (NITROSTAT) 0.4 MG SL tablet Place 1 tablet (0.4 mg total) under the tongue every 5 (five) minutes as needed for chest pain (up to 3 doses). (Patient not taking: Reported on 07/30/2015) 25 tablet 4  . pantoprazole (PROTONIX) 40 MG tablet Take 1 tablet (40 mg total) by mouth daily. 90 tablet 1  . sertraline (ZOLOFT) 50 MG tablet Take 1 tablet (50 mg total) by mouth daily. 90 tablet 1  . traZODone (DESYREL) 50 MG tablet Reported on 05/31/2015    . warfarin (COUMADIN) 3 MG tablet TAKE ONE AND ONE-HALF TO TWO TABLETS DAILY AS DIRECTED BY COUMADIN CLINIC 180 tablet 1   No current facility-administered medications on file prior to visit.     Past Medical History:  Diagnosis Date  . Arthritis   . Asthma   . BPH (benign prostatic hypertrophy)   . CAD (coronary artery disease)    a. 07/2013: s/p DES to LAD, normal LVF.  [name] Complication of anesthesia    "I got all kinds of hallucinations"  . COPD (chronic obstructive pulmonary disease) ([place])    a. 07/2013 PFT's mild airflow obstruction, no restriction, sev decrease in DLCO.  . Diverticulosis   .  DVT (deep venous thrombosis) ([place])    a. 2010 Lower ext s/p back surgery.  [name] Dyspnea on exertion    a. 07/2013 PFT's mild airflow obstr   . GERD (gastroesophageal reflux disease)   . History of gout   . History of hiatal hernia   . Hx of echocardiogram 2015   Echo (06/2013): EF 60-65% normal wall motion, normal diastolic function, aortic sclerosis without stenosis, Trivial MR, mild SAM due to long, redundant mitral leaflets, mild RAE, normal RVSF  . Hypertension   . Macrocytic anemia    a. 07/2013: documented on prior labs.  . Obesity   . PAF (paroxysmal atrial fibrillation) ([place])    a. Flecainide discontinued 07/2013 in setting of CAD.; b. s/p PVI Ablation at [facility] (Dr Joseph Berkshire) 11/2014  . Pneumonia ~ 2010 X 1  . Pulmonary embolism ([place])    a. 2010 in setting of DVT post-op back surgery. b. Low prob VQ 07/2013.  [name] Spinal stenosis    Congential  . Transaminitis    a. 07/2013: mild.  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 ANTERIOR LUMBAR [place] ARTHROPLASTY  03/2008   "spacer poped out; had to repair"  . CATARACT EXTRACTION W/ INTRAOCULAR LENS  IMPLANT, BILATERAL Bilateral   . COLONOSCOPY W/ POLYPECTOMY  2004  . Colonoscopy with polypectomy  09/2011   2 tubular adenomas  . CORONARY ANGIOPLASTY WITH STENT PLACEMENT  08/08/2013   "1"  . JOINT REPLACEMENT    . LEFT AND RIGHT HEART CATHETERIZATION WITH CORONARY ANGIOGRAM N/A 08/07/2013   Procedure: LEFT AND RIGHT HEART CATHETERIZATION WITH CORONARY ANGIOGRAM;  Surgeon: Peter M Martinique, MD;  Location[facility] C[facility] LAB;  Service: Cardiovascular;  Laterality: N/A;  . LUMBAR FUSION  02/2008  . PERCUTANEOUS CORONARY STENT INTERVENTION (PCI-S)  08/07/2013   Procedure: PERCUTANEOUS CORONARY STENT INTERVENTION (PCI-S);  Surgeon: Peter M Martinique, MD;  Location: [facility] CATH LAB;  Service: Cardiovascular;;  . PVI ablation  11/2014   Dr. Venita Sheffield [facility]  . TOTAL HIP ARTHROPLASTY Bilateral    . VASECTOMY      Social History   Social History  . Marital status: Married    Spouse name: N/A  . Number of children: 4  . Years of education: N/A   Occupational History  . Retired from [employer] Retired   Social History Main Topics  . Smoking status: Former Smoker    Packs/day: 2.00    Years: 40.00    Types: Cigarettes    Quit date: 03/09/2000  . Smokeless tobacco: Never Used     Comment: smoked 1964-2002, up to 3 ppd  . Alcohol use 0.0 oz/week     Comment: 02/08/2014 "drink a couple times/month"  . Drug use: No  . Sexual activity: Not on file     Comment: "sexually hx is none of your business" (02/08/2014)   Other Topics Concern  . Not on file   Social History Narrative  . No narrative on file  Family History  Problem Relation Age of Onset  . Atrial fibrillation Mother   . COPD Mother   . Heart disease Father   . Colon cancer Father   . Heart attack Brother 72  . Alcohol abuse Brother 16    Died post liver transplant  . Stroke Neg Hx   . Heart attack Paternal Grandfather     Over 75  . Heart attack Paternal Uncle     Less than 55    Review of Systems     Objective:  There were no vitals filed for this visit. There were no vitals filed for this visit. There is no height or weight on file to calculate BMI.   Physical Exam        Assessment & Plan:   See Problem List for Assessment and Plan of chronic medical problems.

## 2015-10-23 NOTE — Progress Notes (Signed)
Subjective:    Patient ID: [name] [name], male    DOB: 1945-05-04, 70 y.o.   MRN: [id number]  HPI He is here for follow up for his hospitalization in [place] for PNA, sepsis.  He was hospitalized in June for one week at the [facility] clinic with acute hypoxemic respiratory failure in the setting of a new right lower lobe pneumonia. He does have a history of mild COPD. He was placed on oxygen, but was weaned off during this hospitalization. He was treated with Levaquin. While in the hospital he had a possible gout attack, which he has experienced in the past. The gout flare was in his fifth toe on his right foot. He did receive steroids.  He has CAD and atrial fibrillation, but those remained stable during his hospitalization.  He stayed in [place] to recover - he stayed with his daughter.   He had little symptoms of the PNA prior to going to the hospital.  He felt tired that day only.   He is still tired and weak.  Does not do well in the heat.   He came home last weekend.  He feels 85-90%.  He has generalized weakness and decreased stamina.    Medications and allergies reviewed with patient and updated if appropriate.  Patient Active Problem List   Diagnosis Date Noted  . Prediabetes 04/17/2015  . COPD GOLD II 08/21/2014  . COPD exacerbation ([place]) 08/21/2014  . Anemia 08/20/2014  . Hyperlipidemia 08/20/2014  . Hyperglycemia 08/20/2014  . Spinal stenosis of lumbar region 05/30/2014  . Abnormal CT of the abdomen 05/30/2014  . Dizziness 02/08/2014  . Macrocytic anemia 08/08/2013  . CAD (coronary artery disease)   . Dyspnea 08/07/2013  . AAA (abdominal aortic aneurysm) without rupture ([place]) 05/02/2013  . Obesity (BMI 30-39.9) 03/21/2013  . Family history of malignant neoplasm of gastrointestinal tract 08/25/2011  . 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 Fatigue 03/24/2011  . DOE (dyspnea on exertion) 03/12/2011  . Warfarin anticoagulation 03/11/2011  . INSOMNIA-SLEEP  DISORDER-UNSPEC 09/12/2009  . CERVICAL RADICULOPATHY, RIGHT 07/24/2009  . SNORING, HX OF 07/24/2009  . DEGENERATIVE JOINT DISEASE, ADVANCED 05/21/2009  . AVASCULAR NECROSIS 05/21/2009  . ALLERGIC RHINITIS CAUSE UNSPECIFIED 11/01/2008  . PULMONARY NODULE, RIGHT LOWER LOBE 10/25/2008  . DIVERTICULOSIS, COLON 10/07/2008  . Hypertrophic obstructive cardiomyopathy ([place]) 10/05/2008  . PULMONARY EMBOLISM, HX OF 10/05/2008  . EDEMA- LOCALIZED 05/07/2008  . ASTHMA 02/20/2008  . OTHER AND UNSPECIFIED HYPERLIPIDEMIA 09/08/2007  . HYPERPLASIA PROSTATE UNS W/UR OBST & OTH LUTS 06/28/2007  . PAF (paroxysmal atrial fibrillation) ([place]) 06/25/2007  . NOCTURIA 05/12/2007  . COLONIC POLYPS 04/01/2006  . GOUT 04/01/2006  . Essential hypertension 04/01/2006  . ACID REFLUX DISEASE 04/01/2006    Current Outpatient Prescriptions on File Prior to Visit  Medication Sig Dispense Refill  . amLODipine (NORVASC) 5 MG tablet Take 1 tablet by mouth daily.    [name] aspirin 81 MG chewable tablet Chew 1 tablet by mouth daily.    [name] atorvastatin (LIPITOR) 10 MG tablet TAKE 1 TABLET EVERY EVENING 90 tablet 0  . fluticasone furoate-vilanterol (BREO ELLIPTA) 200-25 MCG/INH AEPB Inhale into the lungs.    . nitroGLYCERIN (NITROSTAT) 0.4 MG SL tablet Place 1 tablet (0.4 mg total) under the tongue every 5 (five) minutes as needed for chest pain (up to 3 doses). 25 tablet 4  . pantoprazole (PROTONIX) 40 MG tablet Take 1 tablet (40 mg total) by mouth daily. 90 tablet 1  . sertraline (ZOLOFT)  50 MG tablet Take 1 tablet (50 mg total) by mouth daily. 90 tablet 1  . traZODone (DESYREL) 50 MG tablet Reported on 05/31/2015    . warfarin (COUMADIN) 3 MG tablet TAKE ONE AND ONE-HALF TO TWO TABLETS DAILY AS DIRECTED BY COUMADIN CLINIC 180 tablet 1   No current facility-administered medications on file prior to visit.     Past Medical History:  Diagnosis Date  . Arthritis   . Asthma   . BPH (benign prostatic hypertrophy)   . CAD  (coronary artery disease)    a. 07/2013: s/p DES to LAD, normal LVF.  [name] Complication of anesthesia    "I got all kinds of hallucinations"  . COPD (chronic obstructive pulmonary disease) ([place])    a. 07/2013 PFT's mild airflow obstruction, no restriction, sev decrease in DLCO.  . Diverticulosis   . DVT (deep venous thrombosis) ([place])    a. 2010 Lower ext s/p back surgery.  [name] Dyspnea on exertion    a. 07/2013 PFT's mild airflow obstr   . GERD (gastroesophageal reflux disease)   . History of gout   . History of hiatal hernia   . Hx of echocardiogram 2015   Echo (06/2013): EF 60-65% normal wall motion, normal diastolic function, aortic sclerosis without stenosis, Trivial MR, mild SAM due to long, redundant mitral leaflets, mild RAE, normal RVSF  . Hypertension   . Macrocytic anemia    a. 07/2013: documented on prior labs.  . Obesity   . PAF (paroxysmal atrial fibrillation) ([place])    a. Flecainide discontinued 07/2013 in setting of CAD.; b. s/p PVI Ablation at [facility] (Dr Joseph Berkshire) 11/2014  . Pneumonia ~ 2010 X 1  . Pulmonary embolism ([place])    a. 2010 in setting of DVT post-op back surgery. b. Low prob VQ 07/2013.  [name] Spinal stenosis    Congential  . Transaminitis    a. 07/2013: mild.  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 ANTERIOR LUMBAR [place] ARTHROPLASTY  03/2008   "spacer poped out; had to repair"  . CATARACT EXTRACTION W/ INTRAOCULAR LENS  IMPLANT, BILATERAL Bilateral   . COLONOSCOPY W/ POLYPECTOMY  2004  . Colonoscopy with polypectomy  09/2011   2 tubular adenomas  . CORONARY ANGIOPLASTY WITH STENT PLACEMENT  08/08/2013   "1"  . JOINT REPLACEMENT    . LEFT AND RIGHT HEART CATHETERIZATION WITH CORONARY ANGIOGRAM N/A 08/07/2013   Procedure: LEFT AND RIGHT HEART CATHETERIZATION WITH CORONARY ANGIOGRAM;  Surgeon: Peter M Martinique, MD;  Location: [facility] C[facility] LAB;  Service: Cardiovascular;  Laterality: N/A;  . LUMBAR FUSION  02/2008  .  PERCUTANEOUS CORONARY STENT INTERVENTION (PCI-S)  08/07/2013   Procedure: PERCUTANEOUS CORONARY STENT INTERVENTION (PCI-S);  Surgeon: Peter M Martinique, MD;  Location: [facility] CATH LAB;  Service: Cardiovascular;;  . PVI ablation  11/2014   Dr. Venita Sheffield [facility]  . TOTAL HIP ARTHROPLASTY Bilateral   . VASECTOMY      Social History   Social History  . Marital status: Married    Spouse name: N/A  . Number of children: 4  . Years of education: N/A   Occupational History  . Retired from [employer] Retired   Social History Main Topics  . Smoking status: Former Smoker    Packs/day: 2.00    Years: 40.00    Types: Cigarettes    Quit date: 03/09/2000  . Smokeless tobacco: Never Used     Comment: smoked  [id number], up to 3 ppd  . Alcohol use 0.0 oz/week     Comment: 02/08/2014 "drink a couple times/month"  . Drug use: No  . Sexual activity: Not on file     Comment: "sexually hx is none of your business" (02/08/2014)   Other Topics Concern  . Not on file   Social History Narrative  . No narrative on file    Family History  Problem Relation Age of Onset  . Atrial fibrillation Mother   . COPD Mother   . Heart disease Father   . Colon cancer Father   . Heart attack Brother 36  . Alcohol abuse Brother 93    Died post liver transplant  . Stroke Neg Hx   . Heart attack Paternal Grandfather     Over 12  . Heart attack Paternal Uncle     Less than 55    Review of Systems  Constitutional: Positive for appetite change (decreased, improving) and fatigue. Negative for chills and fever.  Respiratory: Positive for cough and shortness of breath (occasionally). Negative for wheezing.   Cardiovascular: Negative for chest pain, palpitations and leg swelling.  Gastrointestinal: Negative for abdominal pain and nausea.       No gerd  Neurological: Negative for dizziness, light-headedness and headaches.       Objective:   Vitals:   10/24/15 1134  BP: 114/78  Pulse: 90  Resp: 18  Temp:  98.1 F (36.7 C)   Filed Weights   10/24/15 1134  Weight: 261 lb (118.4 kg)   Body mass index is 33.51 kg/m.   Physical Exam Constitutional: Appears well-developed and well-nourished. No distress.  HENT:  Head: Normocephalic and atraumatic.  Neck: Neck supple. No tracheal deviation present. No thyromegaly present.  Cardiovascular: Normal rate, regular rhythm and normal heart sounds.   No murmur heard. No carotid bruit  Pulmonary/Chest: Effort normal and breath sounds normal. No respiratory distress. No has no wheezes. No rales.  Musculoskeletal: No edema.  Lymphadenopathy: No cervical adenopathy.  Skin: Skin is warm and dry. Not diaphoretic.  Psychiatric: Normal mood and affect. Behavior is normal.         Assessment & Plan:   See Problem List for Assessment and Plan of chronic medical problems.

## 2015-10-24 ENCOUNTER — Encounter: Payer: Self-pay | Admitting: Internal Medicine

## 2015-10-24 ENCOUNTER — Ambulatory Visit (INDEPENDENT_AMBULATORY_CARE_PROVIDER_SITE_OTHER): Payer: Medicare Other | Admitting: Internal Medicine

## 2015-10-24 VITALS — BP 114/78 | HR 90 | Temp 98.1°F | Resp 18 | Wt 261.0 lb

## 2015-10-24 DIAGNOSIS — J189 Pneumonia, unspecified organism: Secondary | ICD-10-CM | POA: Diagnosis not present

## 2015-10-24 DIAGNOSIS — A419 Sepsis, unspecified organism: Secondary | ICD-10-CM | POA: Diagnosis not present

## 2015-10-24 DIAGNOSIS — I2510 Atherosclerotic heart disease of native coronary artery without angina pectoris: Secondary | ICD-10-CM

## 2015-10-24 NOTE — Progress Notes (Signed)
Pre visit review using our clinic review tool, if applicable. No additional management support is needed unless otherwise documented below in the visit note. 

## 2015-10-24 NOTE — Patient Instructions (Signed)
    No immunizations administered today.   Medications reviewed and updated.  No changes recommended at this time.

## 2015-10-24 NOTE — Assessment & Plan Note (Signed)
PNA associated with sepsis - was in [place] in June for one week Improved but still not 100% improving slowly No follow up or treatment needed Will give him a booster of the pneumovax at his next visit He will monitor closely and if he feels he does not continue to improve

## 2015-10-24 NOTE — Assessment & Plan Note (Signed)
Improved but still not 100% improving slowly No follow up or treatment needed Will give him a booster of the pneumovax at his next visit He will monitor closely and if he feels he does not continue to improve

## 2015-10-30 ENCOUNTER — Ambulatory Visit (INDEPENDENT_AMBULATORY_CARE_PROVIDER_SITE_OTHER): Payer: Medicare Other | Admitting: Pulmonary Disease

## 2015-10-30 ENCOUNTER — Encounter: Payer: Self-pay | Admitting: Pulmonary Disease

## 2015-10-30 VITALS — BP 102/70 | HR 82 | Ht 74.0 in | Wt 266.0 lb

## 2015-10-30 DIAGNOSIS — I2510 Atherosclerotic heart disease of native coronary artery without angina pectoris: Secondary | ICD-10-CM | POA: Diagnosis not present

## 2015-10-30 DIAGNOSIS — R0683 Snoring: Secondary | ICD-10-CM

## 2015-10-30 NOTE — Progress Notes (Signed)
Past Surgical History He  has a past surgical history that includes Vasectomy; Lumbar fusion (02/2008); Total hip arthroplasty (Bilateral); Cataract extraction w/ intraocular lens  implant, bilateral (Bilateral); Colonoscopy w/ polypectomy (2004); Joint replacement; Anterior lumbar disc arthroplasty (03/2008); Coronary angioplasty with stent (08/08/2013); left and right heart catheterization with coronary angiogram (N/A, 08/07/2013); percutaneous coronary stent intervention (pci-s) (08/07/2013); Colonoscopy with polypectomy (09/2011); and PVI ablation (11/2014).  Allergies  Allergen Reactions  . Penicillins Anaphylaxis     Because of a history of documented adverse serious drug reaction;Medi Alert bracelet  is recommended    Family History His family history includes Alcohol abuse (age of onset: 69) in his brother; Atrial fibrillation in his mother; COPD in his mother; Colon cancer in his father; Heart attack in his paternal grandfather and paternal uncle; Heart attack (age of onset: 75) in his brother; Heart disease in his father.  Social History He  reports that he quit smoking about 15 years ago. His smoking use included Cigarettes. He has a 80.00 pack-year smoking history. He has never used smokeless tobacco. He reports that he drinks alcohol. He reports that he does not use drugs.  Review of systems Constitutional: Negative for fever and unexpected weight change.  HENT: Positive for dental problem. Negative for congestion, ear pain, nosebleeds, postnasal drip, rhinorrhea, sinus pressure, sneezing, sore throat and trouble swallowing.   Eyes: Negative for redness and itching.  Respiratory: Positive for cough and shortness of breath. Negative for chest tightness and wheezing.   Cardiovascular: Positive for palpitations ( irregular ). Negative for leg swelling.  Gastrointestinal: Negative for nausea and vomiting.       Acid heartburn  Genitourinary: Negative for dysuria.  Musculoskeletal: Positive  for arthralgias. Negative for joint swelling.  Skin: Negative for rash.  Neurological: Negative for headaches.  Hematological: Does not bruise/bleed easily.  Psychiatric/Behavioral: Negative for dysphoric mood. The patient is not nervous/anxious.     Current Outpatient Prescriptions on File Prior to Visit  Medication Sig  . ALBUTEROL IN Inhale into the lungs.  [name] amLODipine (NORVASC) 5 MG tablet Take 1 tablet by mouth daily.  [name] aspirin 81 MG chewable tablet Chew 1 tablet by mouth daily.  [name] atorvastatin (LIPITOR) 10 MG tablet TAKE 1 TABLET EVERY EVENING  . fluticasone furoate-vilanterol (BREO ELLIPTA) 200-25 MCG/INH AEPB Inhale into the lungs.  . nitroGLYCERIN (NITROSTAT) 0.4 MG SL tablet Place 1 tablet (0.4 mg total) under the tongue every 5 (five) minutes as needed for chest pain (up to 3 doses).  . pantoprazole (PROTONIX) 40 MG tablet Take 1 tablet (40 mg total) by mouth daily.  . sertraline (ZOLOFT) 50 MG tablet Take 1 tablet (50 mg total) by mouth daily.  . traZODone (DESYREL) 50 MG tablet Reported on 05/31/2015  . vitamin B-12 (CYANOCOBALAMIN) 1000 MCG tablet Take 1,000 mcg by mouth daily.  [name] warfarin (COUMADIN) 3 MG tablet TAKE ONE AND ONE-HALF TO TWO TABLETS DAILY AS DIRECTED BY COUMADIN CLINIC   No current facility-administered medications on file prior to visit.     Chief Complaint  Patient presents with  . Sleep Consult    Referred by Dr Quay Burow. Epworth Score: 5    Pulmonary tests: PFT 07/25/13 >> FEV1 2.94 (77%), FEV1% 69, TLC 6.38 (81%), DLCO 46% V/Q scan 08/02/13 >> low probability for PE Spirometry 08/21/14 >> FEV1 2.65 (68%), FEV1% 69 CT chest 08/29/14 >> atherosclerosis, mild paraseptal emphysema  Cardiac tests Echo 07/05/13 >> EF 60 to 65%  Past medical history He  has  a past medical history of Arthritis; Asthma; BPH (benign prostatic hypertrophy); CAD (coronary artery disease); Complication of anesthesia; COPD (chronic obstructive pulmonary disease) ([place]);  Diverticulosis; DVT (deep venous thrombosis) ([place]); Dyspnea on exertion; GERD (gastroesophageal reflux disease); History of gout; History of hiatal hernia; echocardiogram (2015); Hypertension; Macrocytic anemia; Obesity; PAF (paroxysmal atrial fibrillation) ([place]); Pneumonia (~ 2010 X 1); Pulmonary embolism ([place]); Spinal stenosis; Transaminitis; and Ulcerative proctitis ([place]) (08/25/2011).  Vital signs BP 102/70 (BP Location: Left Arm, Cuff Size: Normal)   Pulse 82   Ht 6\' 2"  (1.88 m)   Wt 266 lb (120.7 kg)   SpO2 97%   BMI 34.15 kg/m   History of Present Illness [name] [name] is a 70 y.o. male for evaluation of sleep problems.  He was visiting family in [place].  He developed recurrent A fib.  He had cardioversion and ablation at [facility].  He was advised by EP cardiology to have sleep study.  He snores.  He sleeps in separate room from his wife due to his snoring.  He gets dry mouth, and coughs at night.    He goes to sleep at at 10 pm.  He falls asleep after an hour.  He plays on computer and listens to radio while in bed.  He wakes up 3 to 4 times to use the bathroom.  He gets out of bed at 8 am.  It is a struggle for him to get going in the morning.  He feels tired in the morning.  He denies morning headache.  He uses trazodone 2 to 3 times per week >> still has trouble falling asleep.  He drinks coffee in the morning and diet coke with dinner.  He has to nap for at least an hour in the afternoon.  He has more trouble staying awake on long car rides, and has to stop and rest more frequently.  He denies sleep walking, sleep talking, bruxism, or nightmares.  There is no history of restless legs.  He denies sleep hallucinations, sleep paralysis, or cataplexy.  The Epworth score is 5 out of 24.   Physical Exam:  General - No distress ENT - No sinus tenderness, no oral exudate, no LAN, no thyromegaly, TM clear, pupils equal/reactive, MP 3, elongated uvula Cardiac - s1s2  regular, no murmur, pulses symmetric Chest - No wheeze/rales/dullness, good air entry, normal respiratory excursion Back - No focal tenderness Abd - Soft, non-tender, no organomegaly, + bowel sounds Ext - No edema Neuro - Normal strength, cranial nerves intact Skin - No rashes Psych - Normal mood, and behavior  Discussion: He has snoring, sleep disruption, apnea, daytime sleepiness.  He has hx of refractory atrial fibrillation and hypertension.  I am concerned he could have sleep apnea.  We discussed how sleep apnea can affect various health problems, including risks for hypertension, cardiovascular disease, and diabetes.  We also discussed how sleep disruption can increase risks for accidents, such as while driving.  Weight loss as a means of improving sleep apnea was also reviewed.  Additional treatment options discussed were CPAP therapy, oral appliance, and surgical intervention.  Assessment/plan:  Snoring with concern for obstructive sleep apnea. - will arrange for home sleep study, pending insurance approval  COPD. - contine Breo, prn albuterol   Patient Instructions  Will arrange for home sleep study Will call to arrange for follow up after sleep study reviewed     Chesley Mires, M.D. Pager [phone] 10/30/2015, 11:02 AM

## 2015-10-30 NOTE — Patient Instructions (Signed)
Will arrange for home sleep study Will call to arrange for follow up after sleep study reviewed  

## 2015-10-30 NOTE — Progress Notes (Signed)
   Subjective:    Patient ID: [name] [name], male    DOB: 03-17-1945, 70 y.o.   MRN: [id number]  HPI    Review of Systems  Constitutional: Negative for fever and unexpected weight change.  HENT: Positive for dental problem. Negative for congestion, ear pain, nosebleeds, postnasal drip, rhinorrhea, sinus pressure, sneezing, sore throat and trouble swallowing.   Eyes: Negative for redness and itching.  Respiratory: Positive for cough and shortness of breath. Negative for chest tightness and wheezing.   Cardiovascular: Positive for palpitations ( irregular ). Negative for leg swelling.  Gastrointestinal: Negative for nausea and vomiting.       Acid heartburn  Genitourinary: Negative for dysuria.  Musculoskeletal: Positive for arthralgias. Negative for joint swelling.  Skin: Negative for rash.  Neurological: Negative for headaches.  Hematological: Does not bruise/bleed easily.  Psychiatric/Behavioral: Negative for dysphoric mood. The patient is not nervous/anxious.        Objective:   Physical Exam        Assessment & Plan:

## 2015-11-04 ENCOUNTER — Encounter: Payer: Self-pay | Admitting: Cardiology

## 2015-11-08 ENCOUNTER — Other Ambulatory Visit: Payer: Self-pay

## 2015-11-12 ENCOUNTER — Ambulatory Visit: Payer: Medicare Other | Admitting: Pulmonary Disease

## 2015-11-13 DIAGNOSIS — G4733 Obstructive sleep apnea (adult) (pediatric): Secondary | ICD-10-CM | POA: Diagnosis not present

## 2015-11-14 ENCOUNTER — Ambulatory Visit (INDEPENDENT_AMBULATORY_CARE_PROVIDER_SITE_OTHER)
Admission: RE | Admit: 2015-11-14 | Discharge: 2015-11-14 | Disposition: A | Discharge status: Home / Self Care | Payer: Medicare Other | Source: Ambulatory Visit | Attending: Pulmonary Disease | Admitting: Pulmonary Disease

## 2015-11-14 ENCOUNTER — Telehealth: Payer: Self-pay | Admitting: Pulmonary Disease

## 2015-11-14 ENCOUNTER — Ambulatory Visit (INDEPENDENT_AMBULATORY_CARE_PROVIDER_SITE_OTHER): Payer: Medicare Other | Admitting: Pulmonary Disease

## 2015-11-14 ENCOUNTER — Encounter: Payer: Self-pay | Admitting: Pulmonary Disease

## 2015-11-14 VITALS — BP 118/68 | HR 68 | Ht 74.0 in | Wt 262.0 lb

## 2015-11-14 DIAGNOSIS — I2510 Atherosclerotic heart disease of native coronary artery without angina pectoris: Secondary | ICD-10-CM | POA: Diagnosis not present

## 2015-11-14 DIAGNOSIS — Z23 Encounter for immunization: Secondary | ICD-10-CM | POA: Diagnosis not present

## 2015-11-14 DIAGNOSIS — Z8701 Personal history of pneumonia (recurrent): Secondary | ICD-10-CM

## 2015-11-14 DIAGNOSIS — G4733 Obstructive sleep apnea (adult) (pediatric): Secondary | ICD-10-CM

## 2015-11-14 DIAGNOSIS — J189 Pneumonia, unspecified organism: Secondary | ICD-10-CM

## 2015-11-14 DIAGNOSIS — J181 Lobar pneumonia, unspecified organism: Secondary | ICD-10-CM

## 2015-11-14 DIAGNOSIS — J9811 Atelectasis: Secondary | ICD-10-CM | POA: Diagnosis not present

## 2015-11-14 HISTORY — DX: Obstructive sleep apnea (adult) (pediatric): G47.33

## 2015-11-14 NOTE — Telephone Encounter (Signed)
HST 11/13/15 >> AHI 47.2, SaO2 low 70%.  Will have my nurse inform pt that sleep study shows severe sleep apnea.  Options are 1) CPAP now, 2) ROV first.  If He is agreeable to CPAP, then please send order for auto CPAP range 5 to 15 cm H2O with heated humidity and mask of choice.  Have download sent 1 month after starting CPAP and set up ROV 2 months after starting CPAP.  ROV can be with me or NP.

## 2015-11-14 NOTE — Assessment & Plan Note (Signed)
Patient is here for cough per wife's insistence. According to the patient, his cough and dyspnea are back at baseline. He has COPD Gold II and sleep apnea is suspected. He was admitted over at [facility] clinic in June while visiting his daughter. Ended up having pneumonia in the right lung, sepsis, rapid atrial fibrillation.  At present, he coughs on and off, dry. Chronic. Denies fevers and chills. He has chronic exertional dyspnea, stable. No other symptoms.  He uses by mouth daily and albuterol every 4 hours as needed.  Plan to get a chest x-ray today to make sure there is resolution of pneumonia. If there is still an infiltrate, we need to get imaging from [address] clinic and may be review records over in [facility].   Continue other medicines.  Reassurance given, told patient to give [place] a call if he has worsening cough, fevers, chills, dyspnea. At this point, no evidence of infection.Will hold off on new meds.   We'll give flu shot today.

## 2015-11-14 NOTE — Progress Notes (Signed)
Subjective:    Patient ID: [name] [name], male    DOB: March 27, 1945, 70 y.o.   MRN: [id number]  HPI Patient is being seen in the office for COPD GOLD II and possible sleep apnea. He has seen Dr. Melvyn Novas and Dr. Halford Chessman.   ROV 11/14/15 Patient returns to the office because of wife's persistence. He has chronic cough and dyspnea. He goes to [place] several times a year to visit his family. Back in June, while he was there, he was admitted for 8 days for pneumonia, sepsis, rapid atrial fibrillation. He improved and got discharged. He has been back since August. He states the cough and dyspnea are back to baseline. Wife thinks otherwise. Just turned in his sleep study today.   Review of Systems  Constitutional: Negative.   HENT: Negative.   Eyes: Negative.   Respiratory: Positive for cough and shortness of breath.   Cardiovascular: Negative.   Gastrointestinal: Negative.   Endocrine: Negative.   Genitourinary: Negative.   Musculoskeletal: Negative.   Allergic/Immunologic: Negative.   Neurological: Negative.   Hematological: Negative.   Psychiatric/Behavioral: Negative.   All other systems reviewed and are negative.   Past Medical History:  Diagnosis Date  . Arthritis   . Asthma   . BPH (benign prostatic hypertrophy)   . CAD (coronary artery disease)    a. 07/2013: s/p DES to LAD, normal LVF.  [name] Complication of anesthesia    "I got all kinds of hallucinations"  . COPD (chronic obstructive pulmonary disease) ([place])    a. 07/2013 PFT's mild airflow obstruction, no restriction, sev decrease in DLCO.  . Diverticulosis   . DVT (deep venous thrombosis) ([place])    a. 2010 Lower ext s/p back surgery.  [name] Dyspnea on exertion    a. 07/2013 PFT's mild airflow obstr   . GERD (gastroesophageal reflux disease)   . History of gout   . History of hiatal hernia   . Hx of echocardiogram 2015   Echo (06/2013): EF 60-65% normal wall motion, normal diastolic function, aortic sclerosis without  stenosis, Trivial MR, mild SAM due to long, redundant mitral leaflets, mild RAE, normal RVSF  . Hypertension   . Macrocytic anemia    a. 07/2013: documented on prior labs.  . Obesity   . PAF (paroxysmal atrial fibrillation) ([place])    a. Flecainide discontinued 07/2013 in setting of CAD.; b. s/p PVI Ablation at [facility] (Dr Joseph Berkshire) 11/2014  . Pneumonia ~ 2010 X 1  . Pulmonary embolism ([place])    a. 2010 in setting of DVT post-op back surgery. b. Low prob VQ 07/2013.  [name] Spinal stenosis    [place]  . Transaminitis    a. 07/2013: mild.  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 Heart attack Paternal Grandfather     Over 35  . Atrial fibrillation Mother   . COPD Mother   . Heart disease Father   . Colon cancer Father   . Heart attack Brother 57  . Alcohol abuse Brother 58    Died post liver transplant  . Heart attack Paternal Uncle     Less than 64  . Stroke Neg Hx      Past Surgical History:  Procedure Laterality Date  . ANTERIOR LUMBAR [place] ARTHROPLASTY  03/2008   "spacer poped out; had to repair"  . CATARACT EXTRACTION W/ INTRAOCULAR LENS  IMPLANT, BILATERAL Bilateral   . COLONOSCOPY W/ POLYPECTOMY  2004  . Colonoscopy with polypectomy  09/2011   2 tubular adenomas  . CORONARY ANGIOPLASTY WITH STENT PLACEMENT  08/08/2013   "1"  . JOINT REPLACEMENT    . LEFT AND RIGHT HEART CATHETERIZATION WITH CORONARY ANGIOGRAM N/A 08/07/2013   Procedure: LEFT AND RIGHT HEART CATHETERIZATION WITH CORONARY ANGIOGRAM;  Surgeon: Peter M Martinique, MD;  Location: [facility] CATH LAB;  Service: Cardiovascular;  Laterality: N/A;  . LUMBAR FUSION  02/2008  . PERCUTANEOUS CORONARY STENT INTERVENTION (PCI-S)  08/07/2013   Procedure: PERCUTANEOUS CORONARY STENT INTERVENTION (PCI-S);  Surgeon: Peter M Martinique, MD;  Location: [facility] CATH LAB;  Service: Cardiovascular;;  . PVI ablation  11/2014   Dr. Venita Sheffield [facility]  . TOTAL HIP ARTHROPLASTY Bilateral     . VASECTOMY      Social History   Social History  . Marital status: Married    Spouse name: N/A  . Number of children: 4  . Years of education: N/A   Occupational History  . Retired from [employer] Retired   Social History Main Topics  . Smoking status: Former Smoker    Packs/day: 2.00    Years: 40.00    Types: Cigarettes    Quit date: 03/09/2000  . Smokeless tobacco: Never Used     Comment: smoked 1964-2002, up to 3 ppd  . Alcohol use 0.0 oz/week     Comment: 1-2 beers every 2 weeks (10/30/15) /// 02/08/2014 "drink a couple times/month"  . Drug use: No  . Sexual activity: Not on file     Comment: "sexually hx is none of your business" (02/08/2014)   Other Topics Concern  . Not on file   Social History Narrative  . No narrative on file     Allergies  Allergen Reactions  . Penicillins Anaphylaxis     Because of a history of documented adverse serious drug reaction;Medi Alert bracelet  is recommended     Outpatient Medications Prior to Visit  Medication Sig Dispense Refill  . ALBUTEROL IN Inhale into the lungs.    [name] amLODipine (NORVASC) 5 MG tablet Take 1 tablet by mouth daily.    [name] aspirin 81 MG chewable tablet Chew 1 tablet by mouth daily.    [name] atorvastatin (LIPITOR) 10 MG tablet TAKE 1 TABLET EVERY EVENING 90 tablet 0  . fluticasone furoate-vilanterol (BREO ELLIPTA) 200-25 MCG/INH AEPB Inhale into the lungs.    . nitroGLYCERIN (NITROSTAT) 0.4 MG SL tablet Place 1 tablet (0.4 mg total) under the tongue every 5 (five) minutes as needed for chest pain (up to 3 doses). 25 tablet 4  . pantoprazole (PROTONIX) 40 MG tablet Take 1 tablet (40 mg total) by mouth daily. 90 tablet 1  . sertraline (ZOLOFT) 50 MG tablet Take 1 tablet (50 mg total) by mouth daily. 90 tablet 1  . traZODone (DESYREL) 50 MG tablet Reported on 05/31/2015    . vitamin B-12 (CYANOCOBALAMIN) 1000 MCG tablet Take 1,000 mcg by mouth daily.    [name] warfarin (COUMADIN) 3 MG tablet TAKE ONE AND ONE-HALF TO TWO TABLETS  DAILY AS DIRECTED BY COUMADIN CLINIC 180 tablet 1   No facility-administered medications prior to visit.    No orders of the defined types were placed in this encounter.        Objective:   Physical Exam Vitals:  Vitals:   11/14/15 1011  BP: 118/68  Pulse: 68  SpO2: 92%  Weight: 262 lb (118.8 kg)  Height: 6\' 2"  (1.88 m)  Constitutional/General:  Pleasant, well-nourished, well-developed, not in any distress,  Comfortably seating.  Well kempt  Body mass index is 33.64 kg/m. Wt Readings from Last 3 Encounters:  11/14/15 262 lb (118.8 kg)  10/30/15 266 lb (120.7 kg)  10/24/15 261 lb (118.4 kg)     HEENT: Pupils equal and reactive to light and accommodation. Anicteric sclerae. Normal nasal mucosa.   No oral  lesions,  mouth clear,  oropharynx clear, no postnasal drip. (-) Oral thrush. No dental caries.  Airway - Mallampati class III  Neck: No masses. Midline trachea. No JVD, (-) LAD. (-) bruits appreciated.  Respiratory/Chest: Grossly normal chest. (-) deformity. (-) Accessory muscle use.  Symmetric expansion. (-) Tenderness on palpation.  Resonant on percussion.  Diminished BS on both lower lung zones. (-) wheezing, crackles, rhonchi (-) egophony  Cardiovascular: Regular rate and  rhythm, heart sounds normal, no murmur or gallops, no peripheral edema  Gastrointestinal:  Normal bowel sounds. Soft, non-tender. No hepatosplenomegaly.  (-) masses.   Musculoskeletal:  Normal muscle tone. Normal gait.   Extremities: Grossly normal. (-) clubbing, cyanosis.  (-) edema  Skin: (-) rash,lesions seen.   Neurological/Psychiatric : alert, oriented to time, place, person. Normal mood and affect           Assessment & Plan:  Pneumonia Patient is here for cough per wife's insistence. According to the patient, his cough and dyspnea are back at baseline. He has COPD Gold II and sleep apnea is suspected. He was admitted over at [facility] clinic in June while  visiting his daughter. Ended up having pneumonia in the right lung, sepsis, rapid atrial fibrillation.  At present, he coughs on and off, dry. Chronic. Denies fevers and chills. He has chronic exertional dyspnea, stable. No other symptoms.  He uses by mouth daily and albuterol every 4 hours as needed.  Plan to get a chest x-ray today to make sure there is resolution of pneumonia. If there is still an infiltrate, we need to get imaging from [place] clinic and may be review records over in [facility].   Continue other medicines.  Reassurance given, told patient to give [place] a call if he has worsening cough, fevers, chills, dyspnea. At this point, no evidence of infection.Will hold off on new meds.   We'll give flu shot today.    Return to clinic in with Dr. Halford Chessman as scheduled.   Monica Becton, MD 11/14/2015, 10:35 AM [facility] Pager [phone] After 3 pm or if no answer, call [phone]

## 2015-11-14 NOTE — Patient Instructions (Signed)
It was a pleasure taking care of you today!  Her cough seems stable compared to baseline. We will get a chest x-ray to make sure the pneumonia in June has resolved.  Please call the office if you are having adverse reaction to meds/antibiotics.  Please call the office your symptoms are getting worse despite the meds/antibiotics.   Return to clinic in with Dr. Halford Chessman as scheduled.

## 2015-11-15 ENCOUNTER — Other Ambulatory Visit: Payer: Self-pay | Admitting: *Deleted

## 2015-11-15 DIAGNOSIS — R0683 Snoring: Secondary | ICD-10-CM

## 2015-11-15 DIAGNOSIS — G4733 Obstructive sleep apnea (adult) (pediatric): Secondary | ICD-10-CM | POA: Diagnosis not present

## 2015-11-17 NOTE — Progress Notes (Signed)
HPI  The patient presents for follow up of atrial fibrillation, HTN, prior DVT and pulmonary emboli and CAD.   He was in [place] in the past and went into atrial fib and he developed symptoms. [name]  He was hospitalized at the [facility]  He did have a nuclear stress test which was negative.  He was started on Sotalol but his QTc increased on 120 mg BID.  He was discharged on 80 mg BID.  The patient says he did require cardioversion.  Of note an echocardiogram did demonstrate MR.  He did have some bradycardia and orthostasis during that hospitalization.  Of note he was also screened for septal hypertrophy as there was some of this with SAM noted on the echo.  However, MRI did not confirm this.    He went back at the [facility] clinic and had an ablation by Dr. Alcide Clever.  Amiodarone was discontinued.  He has been back to [facility] clinic again in December.  He had a CT scan and had no evidence of pulmonary vein stenosis.  Since I last saw him he was again visiting in [place] in July and developed pneumonia with sepsis. There were apparently no cardiac issues at that time. He's had no acute complaints though he has chronic dyspnea. He's trying to walk and says some days he short of breath and other days he is not. He denies any chest pressure, neck or arm discomfort. He's had no palpitations, presyncope or syncope. He's had no PND or orthopnea. He has been diagnosed with severe sleep apnea and is going to have this treated and is waiting for his mask.  Allergies  Allergen Reactions  . Penicillins Anaphylaxis     Because of a history of documented adverse serious drug reaction;Medi Alert bracelet  is recommended    Current Outpatient Prescriptions  Medication Sig Dispense Refill  . ALBUTEROL IN Inhale into the lungs.    [name] amLODipine (NORVASC) 5 MG tablet Take 1 tablet by mouth daily.    [name] aspirin 81 MG chewable tablet Chew 1 tablet by mouth daily.    [name] atorvastatin (LIPITOR) 10 MG tablet  TAKE 1 TABLET EVERY EVENING 90 tablet 0  . fluticasone furoate-vilanterol (BREO ELLIPTA) 200-25 MCG/INH AEPB Inhale into the lungs.    . nitroGLYCERIN (NITROSTAT) 0.4 MG SL tablet Place 1 tablet (0.4 mg total) under the tongue every 5 (five) minutes as needed for chest pain (up to 3 doses). 25 tablet 4  . pantoprazole (PROTONIX) 40 MG tablet Take 1 tablet (40 mg total) by mouth daily. 90 tablet 1  . sertraline (ZOLOFT) 50 MG tablet Take 1 tablet (50 mg total) by mouth daily. 90 tablet 1  . traZODone (DESYREL) 50 MG tablet Reported on 05/31/2015    . vitamin B-12 (CYANOCOBALAMIN) 1000 MCG tablet Take 1,000 mcg by mouth daily.    [name] warfarin (COUMADIN) 3 MG tablet TAKE ONE AND ONE-HALF TO TWO TABLETS DAILY AS DIRECTED BY COUMADIN CLINIC 180 tablet 1   No current facility-administered medications for this visit.     Past Medical History:  Diagnosis Date  . Arthritis   . Asthma   . BPH (benign prostatic hypertrophy)   . CAD (coronary artery disease)    a. 07/2013: s/p DES to LAD, normal LVF.  [name] Complication of anesthesia    "I got all kinds of hallucinations"  . COPD (chronic obstructive pulmonary disease) ([place])    a. 07/2013 PFT's mild airflow obstruction, no restriction, sev decrease  in DLCO.  . Diverticulosis   . DVT (deep venous thrombosis) ([place])    a. 2010 Lower ext s/p back surgery.  [name] Dyspnea on exertion    a. 07/2013 PFT's mild airflow obstr   . GERD (gastroesophageal reflux disease)   . History of gout   . History of hiatal hernia   . Hx of echocardiogram 2015   Echo (06/2013): EF 60-65% normal wall motion, normal diastolic function, aortic sclerosis without stenosis, Trivial MR, mild SAM due to long, redundant mitral leaflets, mild RAE, normal RVSF  . Hypertension   . Macrocytic anemia    a. 07/2013: documented on prior labs.  . Obesity   . OSA (obstructive sleep apnea) 11/14/2015  . PAF (paroxysmal atrial fibrillation) ([place])    a. Flecainide discontinued 07/2013 in setting of  CAD.; b. s/p PVI Ablation at [facility] (Dr Joseph Berkshire) 11/2014  . Pneumonia ~ 2010 X 1  . Pulmonary embolism ([place])    a. 2010 in setting of DVT post-op back surgery. b. Low prob VQ 07/2013.  [name] Spinal stenosis    Congential  . Transaminitis    a. 07/2013: mild.  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 ANTERIOR LUMBAR [place] ARTHROPLASTY  03/2008   "spacer poped out; had to repair"  . CATARACT EXTRACTION W/ INTRAOCULAR LENS  IMPLANT, BILATERAL Bilateral   . COLONOSCOPY W/ POLYPECTOMY  2004  . Colonoscopy with polypectomy  09/2011   2 tubular adenomas  . CORONARY ANGIOPLASTY WITH STENT PLACEMENT  08/08/2013   "1"  . JOINT REPLACEMENT    . LEFT AND RIGHT HEART CATHETERIZATION WITH CORONARY ANGIOGRAM N/A 08/07/2013   Procedure: LEFT AND RIGHT HEART CATHETERIZATION WITH CORONARY ANGIOGRAM;  Surgeon: Peter M Martinique, MD;  Location: [facility] CATH LAB;  Service: Cardiovascular;  Laterality: N/A;  . LUMBAR FUSION  02/2008  . PERCUTANEOUS CORONARY STENT INTERVENTION (PCI-S)  08/07/2013   Procedure: PERCUTANEOUS CORONARY STENT INTERVENTION (PCI-S);  Surgeon: Peter M Martinique, MD;  Location: [facility] CATH LAB;  Service: Cardiovascular;;  . PVI ablation  11/2014   Dr. Venita Sheffield [facility]  . TOTAL HIP ARTHROPLASTY Bilateral   . VASECTOMY      ROS:   As stated in the HPI and negative for all other systems.  PHYSICAL EXAM BP 130/78   Pulse 88   Ht 6\' 2"  (1.88 m)   Wt 263 lb 3.2 oz (119.4 kg)   BMI 33.79 kg/m  GENERAL:  Well appearing HEENT:  Pupils equal round and reactive, fundi not visualized, oral mucosa unremarkable NECK:  No jugular venous distention, waveform within normal limits, carotid upstroke brisk and symmetric, no bruits, no thyromegaly LYMPHATICS:  No cervical, inguinal adenopathy LUNGS:  Clear to auscultation bilaterally BACK:  No CVA tenderness CHEST:  Unremarkable HEART:  PMI not displaced or sustained,S1 and S2 within normal  limits, no S3, no S4, no clicks, no rubs, no murmurs ABD:  Flat, positive bowel sounds normal in frequency in pitch, no bruits, no rebound, no guarding, no midline pulsatile mass, no hepatomegaly, no splenomegaly, obese EXT:  2 plus pulses throughout, no edema, no cyanosis no clubbing, rash   ASSESSMENT AND PLAN  ATRIAL FIBRILLATION:   He has had ablation now and no change in therapy is indicated. He's had no recurrent dysrhythmias.  DYSPNEA:  He has had an extensive work up.  Symptoms are chronic and stable.  No change in therapy or further testing is indicated.  AAA:  This was 3.1 recently.  No further imaging is indicated at this time.   CAD:   The patient has no new sypmtoms.  No further cardiovascular testing is indicated.  We will continue with aggressive risk reduction and meds as listed.  HTN:   The blood pressure is at target. No change in medications is indicated. We will continue with therapeutic lifestyle changes (TLC).   MR:  He had some mild  MR and mild septal hypertrophy on his last echo.  No change in therapy is needed.  No further imaging at this time.   SLEEP APNEA:  Follow up per Dr. Halford Chessman.

## 2015-11-18 NOTE — Telephone Encounter (Signed)
Called spoke with pt's wife. Reviewed results and recs. Scheduled ov for 01/14/16 with VS. She voiced understanding and had no further questions.

## 2015-11-18 NOTE — Progress Notes (Signed)
Called spoke with pt's wife. Reviewed results and recs. She voiced understanding and had no further questions.

## 2015-11-18 NOTE — Addendum Note (Signed)
Addended by: Osa Craver on: 11/18/2015 10:47 AM   Modules accepted: Orders

## 2015-11-19 ENCOUNTER — Ambulatory Visit (INDEPENDENT_AMBULATORY_CARE_PROVIDER_SITE_OTHER): Payer: Medicare Other | Admitting: Pharmacist

## 2015-11-19 ENCOUNTER — Encounter: Payer: Self-pay | Admitting: Cardiology

## 2015-11-19 ENCOUNTER — Ambulatory Visit (INDEPENDENT_AMBULATORY_CARE_PROVIDER_SITE_OTHER): Payer: Medicare Other | Admitting: Cardiology

## 2015-11-19 VITALS — BP 130/78 | HR 88 | Ht 74.0 in | Wt 263.2 lb

## 2015-11-19 DIAGNOSIS — I481 Persistent atrial fibrillation: Secondary | ICD-10-CM | POA: Diagnosis not present

## 2015-11-19 DIAGNOSIS — I714 Abdominal aortic aneurysm, without rupture, unspecified: Secondary | ICD-10-CM

## 2015-11-19 DIAGNOSIS — I4891 Unspecified atrial fibrillation: Secondary | ICD-10-CM

## 2015-11-19 DIAGNOSIS — I2510 Atherosclerotic heart disease of native coronary artery without angina pectoris: Secondary | ICD-10-CM

## 2015-11-19 DIAGNOSIS — I480 Paroxysmal atrial fibrillation: Secondary | ICD-10-CM

## 2015-11-19 DIAGNOSIS — R0602 Shortness of breath: Secondary | ICD-10-CM

## 2015-11-19 DIAGNOSIS — Z86718 Personal history of other venous thrombosis and embolism: Secondary | ICD-10-CM | POA: Diagnosis not present

## 2015-11-19 DIAGNOSIS — I4819 Other persistent atrial fibrillation: Secondary | ICD-10-CM

## 2015-11-19 LAB — POCT INR: INR: 3.1

## 2015-11-19 NOTE — Patient Instructions (Signed)
Medication Instructions:  Continue current medicattions  Labwork: None Ordered  Testing/Procedures: None Ordered  Follow-Up: Your physician wants you to follow-up in: 6 Months. You will receive a reminder letter in the mail two months in advance. If you don't receive a letter, please call our office to schedule the follow-up appointment.   Any Other Special Instructions Will Be Listed Below (If Applicable).   If you need a refill on your cardiac medications before your next appointment, please call your pharmacy.

## 2015-11-20 ENCOUNTER — Telehealth: Payer: Self-pay | Admitting: Pulmonary Disease

## 2015-11-20 NOTE — Telephone Encounter (Signed)
Spoke with pt's wife and advised that order has been sent to [facility] and they should be contacting pt in the next few days. Advised pt's wife that if they have not heard from [facility] within a week to call and let us know. Also gave her [facility] ph#. Nothing further needed.

## 2015-11-21 ENCOUNTER — Telehealth: Payer: Self-pay | Admitting: Pulmonary Disease

## 2015-11-21 DIAGNOSIS — G4733 Obstructive sleep apnea (adult) (pediatric): Secondary | ICD-10-CM

## 2015-11-21 NOTE — Telephone Encounter (Signed)
Spoke with Melissa and let her know that TP is signing VS's orders until things get straightened out. New order placed and sent to TP to sign. Staff message sent to [facility] to let her know order has been placed, per her request. Nothing further needed.

## 2015-12-01 ENCOUNTER — Other Ambulatory Visit: Payer: Self-pay | Admitting: Internal Medicine

## 2015-12-10 ENCOUNTER — Other Ambulatory Visit: Payer: Self-pay | Admitting: Cardiology

## 2015-12-10 ENCOUNTER — Ambulatory Visit (INDEPENDENT_AMBULATORY_CARE_PROVIDER_SITE_OTHER): Payer: Medicare Other | Admitting: Pharmacist Clinician (PhC)/ Clinical Pharmacy Specialist

## 2015-12-10 DIAGNOSIS — I480 Paroxysmal atrial fibrillation: Secondary | ICD-10-CM | POA: Diagnosis not present

## 2015-12-10 DIAGNOSIS — I4891 Unspecified atrial fibrillation: Secondary | ICD-10-CM

## 2015-12-10 DIAGNOSIS — Z86718 Personal history of other venous thrombosis and embolism: Secondary | ICD-10-CM | POA: Diagnosis not present

## 2015-12-10 LAB — POCT INR: INR: 2.2

## 2015-12-10 NOTE — Telephone Encounter (Signed)
Rx request sent to pharmacy.  

## 2015-12-17 ENCOUNTER — Telehealth: Payer: Self-pay | Admitting: Internal Medicine

## 2015-12-17 NOTE — Telephone Encounter (Signed)
Spoke with pts wife to inform pt is not due for a pneumo vac.

## 2015-12-17 NOTE — Telephone Encounter (Signed)
I was talking to pt's wife about her referrals and she thought pt was due for a pneumonia shot. I seen he had 2 already. Can you please call her to be sure he doesn't need one this year.

## 2015-12-19 ENCOUNTER — Other Ambulatory Visit: Payer: Self-pay | Admitting: *Deleted

## 2015-12-19 MED ORDER — AMLODIPINE BESYLATE 5 MG PO TABS: 5.0000 mg | ORAL_TABLET | Freq: Every day | ORAL | 3 refills | Status: DC

## 2016-01-01 ENCOUNTER — Ambulatory Visit (INDEPENDENT_AMBULATORY_CARE_PROVIDER_SITE_OTHER): Payer: Medicare Other | Admitting: Pharmacist

## 2016-01-01 DIAGNOSIS — I480 Paroxysmal atrial fibrillation: Secondary | ICD-10-CM

## 2016-01-01 DIAGNOSIS — Z86718 Personal history of other venous thrombosis and embolism: Secondary | ICD-10-CM

## 2016-01-01 DIAGNOSIS — I4891 Unspecified atrial fibrillation: Secondary | ICD-10-CM | POA: Diagnosis not present

## 2016-01-01 LAB — POCT INR: INR: 2.4

## 2016-01-02 ENCOUNTER — Emergency Department (HOSPITAL_BASED_OUTPATIENT_CLINIC_OR_DEPARTMENT_OTHER)
Admission: EM | Admit: 2016-01-02 | Discharge: 2016-01-02 | Disposition: A | Discharge status: Home / Self Care | Payer: Medicare Other | Attending: Emergency Medicine | Admitting: Emergency Medicine

## 2016-01-02 ENCOUNTER — Encounter (HOSPITAL_BASED_OUTPATIENT_CLINIC_OR_DEPARTMENT_OTHER): Payer: Self-pay | Admitting: Emergency Medicine

## 2016-01-02 ENCOUNTER — Emergency Department (HOSPITAL_BASED_OUTPATIENT_CLINIC_OR_DEPARTMENT_OTHER): Payer: Medicare Other

## 2016-01-02 DIAGNOSIS — Y929 Unspecified place or not applicable: Secondary | ICD-10-CM | POA: Insufficient documentation

## 2016-01-02 DIAGNOSIS — J45909 Unspecified asthma, uncomplicated: Secondary | ICD-10-CM | POA: Diagnosis not present

## 2016-01-02 DIAGNOSIS — S61317A Laceration without foreign body of left little finger with damage to nail, initial encounter: Secondary | ICD-10-CM | POA: Insufficient documentation

## 2016-01-02 DIAGNOSIS — S6992XA Unspecified injury of left wrist, hand and finger(s), initial encounter: Secondary | ICD-10-CM | POA: Diagnosis present

## 2016-01-02 DIAGNOSIS — Y939 Activity, unspecified: Secondary | ICD-10-CM | POA: Insufficient documentation

## 2016-01-02 DIAGNOSIS — I2510 Atherosclerotic heart disease of native coronary artery without angina pectoris: Secondary | ICD-10-CM | POA: Diagnosis not present

## 2016-01-02 DIAGNOSIS — Y999 Unspecified external cause status: Secondary | ICD-10-CM | POA: Insufficient documentation

## 2016-01-02 DIAGNOSIS — W25XXXA Contact with sharp glass, initial encounter: Secondary | ICD-10-CM | POA: Insufficient documentation

## 2016-01-02 DIAGNOSIS — Z7901 Long term (current) use of anticoagulants: Secondary | ICD-10-CM | POA: Diagnosis not present

## 2016-01-02 DIAGNOSIS — Z79899 Other long term (current) drug therapy: Secondary | ICD-10-CM | POA: Diagnosis not present

## 2016-01-02 DIAGNOSIS — J441 Chronic obstructive pulmonary disease with (acute) exacerbation: Secondary | ICD-10-CM | POA: Insufficient documentation

## 2016-01-02 DIAGNOSIS — M79645 Pain in left finger(s): Secondary | ICD-10-CM | POA: Diagnosis not present

## 2016-01-02 DIAGNOSIS — Z7982 Long term (current) use of aspirin: Secondary | ICD-10-CM | POA: Diagnosis not present

## 2016-01-02 DIAGNOSIS — Z87891 Personal history of nicotine dependence: Secondary | ICD-10-CM | POA: Insufficient documentation

## 2016-01-02 DIAGNOSIS — I10 Essential (primary) hypertension: Secondary | ICD-10-CM | POA: Insufficient documentation

## 2016-01-02 MED ORDER — BACITRACIN ZINC 500 UNIT/GM EX OINT
1.0000 "application " | TOPICAL_OINTMENT | Freq: Once | CUTANEOUS | Status: AC
  Administered 2016-01-02: 1 via TOPICAL

## 2016-01-02 NOTE — ED Provider Notes (Signed)
[facility] DEPT [facility] Provider Note   CSN: [id number] Arrival date & time: 01/02/16  1027     History   Chief Complaint Chief Complaint  Patient presents with  . Finger Injury   HPI   Blood pressure 151/87, pulse 65, temperature 97.9 F (36.6 C), temperature source Oral, resp. rate 18, height 6\' 2"  (1.88 m), weight 117.9 kg, SpO2 97 %.  NERO BURKE is a 70 y.o. male complaining of Laceration to left small digit sustained last night after he dropped a glass in the glass impacted the finger. Patient is right-hand-dominant. He is anticoagulated with Coumadin chronically. Last INR was 2.4 measured yesterday. Bleeding has been intermittent but not difficult to control. Last tetanus shot in 2009 via chart review.patient denies weakness, numbness. He feels tender at the site of injury and is worried that there may be glass shards in the wound.  Past Medical History:  Diagnosis Date  . Arthritis   . Asthma   . BPH (benign prostatic hypertrophy)   . CAD (coronary artery disease)    a. 07/2013: s/p DES to LAD, normal LVF.  [name] Complication of anesthesia    "I got all kinds of hallucinations"  . COPD (chronic obstructive pulmonary disease) ([place])    a. 07/2013 PFT's mild airflow obstruction, no restriction, sev decrease in DLCO.  . Diverticulosis   . DVT (deep venous thrombosis) ([place])    a. 2010 Lower ext s/p back surgery.  [name] Dyspnea on exertion    a. 07/2013 PFT's mild airflow obstr   . GERD (gastroesophageal reflux disease)   . History of gout   . History of hiatal hernia   . Hx of echocardiogram 2015   Echo (06/2013): EF 60-65% normal wall motion, normal diastolic function, aortic sclerosis without stenosis, Trivial MR, mild SAM due to long, redundant mitral leaflets, mild RAE, normal RVSF  . Hypertension   . Macrocytic anemia    a. 07/2013: documented on prior labs.  . Obesity   . OSA (obstructive sleep apnea) 11/14/2015  . PAF (paroxysmal atrial fibrillation) ([place])    a.  Flecainide discontinued 07/2013 in setting of CAD.; b. s/p PVI Ablation at [facility] (Dr Joseph Berkshire) 11/2014  . Pneumonia ~ 2010 X 1  . Pulmonary embolism ([place])    a. 2010 in setting of DVT post-op back surgery. b. Low prob VQ 07/2013.  [name] Spinal stenosis    Congential  . Transaminitis    a. 07/2013: mild.  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 OSA (obstructive sleep apnea) 11/14/2015  . Pneumonia 10/24/2015  . Sepsis ([place]) 10/24/2015  . Prediabetes 04/17/2015  . COPD GOLD II 08/21/2014  . COPD exacerbation ([place]) 08/21/2014  . Anemia 08/20/2014  . Hyperlipidemia 08/20/2014  . Hyperglycemia 08/20/2014  . Spinal stenosis of lumbar region 05/30/2014  . Abnormal CT of the abdomen 05/30/2014  . Dizziness 02/08/2014  . Macrocytic anemia 08/08/2013  . CAD (coronary artery disease)   . Dyspnea 08/07/2013  . AAA (abdominal aortic aneurysm) without rupture ([place]) 05/02/2013  . Obesity (BMI 30-39.9) 03/21/2013  . Family history of malignant neoplasm of gastrointestinal tract 08/25/2011  . 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 Fatigue 03/24/2011  . DOE (dyspnea on exertion) 03/12/2011  . Warfarin anticoagulation 03/11/2011  . INSOMNIA-SLEEP DISORDER-UNSPEC 09/12/2009  . CERVICAL RADICULOPATHY, RIGHT 07/24/2009  . DEGENERATIVE JOINT DISEASE, ADVANCED 05/21/2009  . AVASCULAR NECROSIS 05/21/2009  . ALLERGIC RHINITIS CAUSE UNSPECIFIED 11/01/2008  .  PULMONARY NODULE, RIGHT LOWER LOBE 10/25/2008  . DIVERTICULOSIS, COLON 10/07/2008  . 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 PULMONARY EMBOLISM, HX OF 10/05/2008  . EDEMA- LOCALIZED 05/07/2008  . ASTHMA 02/20/2008  . OTHER AND UNSPECIFIED HYPERLIPIDEMIA 09/08/2007  . HYPERPLASIA PROSTATE UNS W/UR OBST & OTH LUTS 06/28/2007  . PAF (paroxysmal atrial fibrillation) ([place]) 06/25/2007  . NOCTURIA 05/12/2007  . COLONIC POLYPS 04/01/2006  . GOUT 04/01/2006  . Essential  hypertension 04/01/2006  . ACID REFLUX DISEASE 04/01/2006    Past Surgical History:  Procedure Laterality Date  . ANTERIOR LUMBAR [place] ARTHROPLASTY  03/2008   "spacer poped out; had to repair"  . CATARACT EXTRACTION W/ INTRAOCULAR LENS  IMPLANT, BILATERAL Bilateral   . COLONOSCOPY W/ POLYPECTOMY  2004  . Colonoscopy with polypectomy  09/2011   2 tubular adenomas  . CORONARY ANGIOPLASTY WITH STENT PLACEMENT  08/08/2013   "1"  . JOINT REPLACEMENT    . LEFT AND RIGHT HEART CATHETERIZATION WITH CORONARY ANGIOGRAM N/A 08/07/2013   Procedure: LEFT AND RIGHT HEART CATHETERIZATION WITH CORONARY ANGIOGRAM;  Surgeon: Peter M Martinique, MD;  Location: [facility] CATH LAB;  Service: Cardiovascular;  Laterality: N/A;  . LUMBAR FUSION  02/2008  . PERCUTANEOUS CORONARY STENT INTERVENTION (PCI-S)  08/07/2013   Procedure: PERCUTANEOUS CORONARY STENT INTERVENTION (PCI-S);  Surgeon: Peter M Martinique, MD;  Location: [facility] CATH LAB;  Service: Cardiovascular;;  . PVI ablation  11/2014   Dr. Venita Sheffield [facility]  . TOTAL HIP ARTHROPLASTY Bilateral   . VASECTOMY         Home Medications    Prior to Admission medications   Medication Sig Start Date End Date Taking? Authorizing Provider  amLODipine (NORVASC) 5 MG tablet Take 1 tablet (5 mg total) by mouth daily. 12/19/15  Yes Binnie Rail, MD  aspirin 81 MG chewable tablet Chew 1 tablet by mouth daily. 11/18/14  Yes Historical Provider, MD  atorvastatin (LIPITOR) 10 MG tablet TAKE 1 TABLET EVERY EVENING 12/10/15  Yes Minus Breeding, MD  fluticasone furoate-vilanterol (BREO ELLIPTA) 200-25 MCG/INH AEPB Inhale into the lungs. 07/15/15  Yes Historical Provider, MD  pantoprazole (PROTONIX) 40 MG tablet Take 1 tablet (40 mg total) by mouth daily. 10/16/15  Yes Minus Breeding, MD  sertraline (ZOLOFT) 50 MG tablet TAKE 1 TABLET DAILY 12/02/15  Yes Binnie Rail, MD  vitamin B-12 (CYANOCOBALAMIN) 1000 MCG tablet Take 1,000 mcg by mouth daily.   Yes Historical Provider, MD    warfarin (COUMADIN) 3 MG tablet TAKE ONE AND ONE-HALF TO TWO TABLETS DAILY AS DIRECTED BY COUMADIN CLINIC 06/03/15  Yes Minus Breeding, MD  ALBUTEROL IN Inhale into the lungs.    Historical Provider, MD  nitroGLYCERIN (NITROSTAT) 0.4 MG SL tablet Place 1 tablet (0.4 mg total) under the tongue every 5 (five) minutes as needed for chest pain (up to 3 doses). 01/09/15   Minus Breeding, MD  traZODone (DESYREL) 50 MG tablet Reported on 05/31/2015 04/17/15   Historical Provider, MD    Family History Family History  Problem Relation Age of Onset  . Heart attack Paternal Grandfather     Over 86  . Atrial fibrillation Mother   . COPD Mother   . Heart disease Father   . Colon cancer Father   . Heart attack Brother 91  . Alcohol abuse Brother 43    Died post liver transplant  . Heart attack Paternal Uncle     Less than 39  . Stroke Neg Hx  Social History Social History  Substance Use Topics  . Smoking status: Former Smoker    Packs/day: 2.00    Years: 40.00    Types: Cigarettes    Quit date: 03/09/2000  . Smokeless tobacco: Never Used     Comment: smoked 1964-2002, up to 3 ppd  . Alcohol use 0.0 oz/week     Comment: 1-2 beers every 2 weeks (10/30/15) /// 02/08/2014 "drink a couple times/month"     Allergies   Penicillins   Review of Systems Review of Systems  10 systems reviewed and found to be negative, except as noted in the HPI.  Physical Exam Updated Vital Signs BP 151/87 (BP Location: Right Arm)   Pulse 65   Temp 97.9 F (36.6 C) (Oral)   Resp 18   Ht 6\' 2"  (1.88 m)   Wt 117.9 kg   SpO2 97%   BMI 33.38 kg/m   Physical Exam  Constitutional: He is oriented to person, place, and time. He appears well-developed and well-nourished. No distress.  HENT:  Head: Normocephalic and atraumatic.  Mouth/Throat: Oropharynx is clear and moist.  Eyes: Conjunctivae and EOM are normal. Pupils are equal, round, and reactive to light.  Neck: Normal range of motion.   Cardiovascular: Normal rate, regular rhythm and intact distal pulses.   Pulmonary/Chest: Effort normal and breath sounds normal.  Abdominal: Soft. There is no tenderness.  Musculoskeletal: Normal range of motion.       Hands: Half centimeter full-thickness laceration to volar aspect of distal phalanx of left fifth digit.Wound explored with pickups, no foreign body appreciated. Neurovacularly intact with full ROM and strength to each interphalangeal joint (tested in isolation) in both flexion and extension.    Neurological: He is alert and oriented to person, place, and time.  Skin: He is not diaphoretic.  Psychiatric: He has a normal mood and affect.  Nursing note and vitals reviewed.    ED Treatments / Results  Labs (all labs ordered are listed, but only abnormal results are displayed) Labs Reviewed - No data to display  EKG  EKG Interpretation None       Radiology Dg Hand Complete Left  Result Date: 01/02/2016 CLINICAL DATA:  Left pinky finger pain EXAM: LEFT HAND - COMPLETE 3+ VIEW COMPARISON:  None. FINDINGS: There are chronic changes involving the carpal bones. Fragmentation of the scaphoid bone is identified. The fifth digit appears intact. No fractures or dislocations. No radiopaque foreign bodies identified. IMPRESSION: 1. No acute findings.  Unremarkable appearance of the fifth digit. 2. 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 Electronically Signed   By: Kerby Moors M.D.   On: 01/02/2016 10:56    Procedures Procedures (including critical care time)  Medications Ordered in ED Medications  bacitracin ointment 1 application (not administered)     Initial Impression / Assessment and Plan / ED Course  I have reviewed the triage vital signs and the nursing notes.  Pertinent labs & imaging results that were available during my care of the patient were reviewed by me and considered in my medical decision making (see chart for  details).  Clinical Course    Vitals:   01/02/16 1033  BP: 151/87  Pulse: 65  Resp: 18  Temp: 97.9 F (36.6 C)  TempSrc: Oral  SpO2: 97%  Weight: 117.9 kg  Height: 6\' 2"  (1.88 m)    Medications  bacitracin ointment 1 application (not administered)    ALLWIN KRUMENACKER is 70 y.o. male presenting with  Laceration with foreign body sensation to left fifth digit. X-ray without foreign body, wound explored via pickups and no foreign body is palpated. Discussed the option of performing a digital block and exploring more thoroughly however patient declines. Discussed the option of prophylactic antibiotics the patient also declined. I have counseled him on wound care and return precautions.  This is a shared visit with the attending physician who personally evaluated the patient and agrees with the care plan.   Evaluation does not show pathology that would require ongoing emergent intervention or inpatient treatment. Pt is hemodynamically stable and mentating appropriately. Discussed findings and plan with patient/guardian, who agrees with care plan. All questions answered. Return precautions discussed and outpatient follow up given.      Final Clinical Impressions(s) / ED Diagnoses   Final diagnoses:  Laceration of left little finger with damage to nail, foreign body presence unspecified, initial encounter    New Prescriptions New Prescriptions   No medications on file     Monico Blitz, PA-C 01/02/16 [address], PA-C 01/02/16 [place], MD 01/02/16 [phone]

## 2016-01-02 NOTE — Discharge Instructions (Signed)
Keep wound dry and do not remove dressing for 24 hours if possible. After that, wash gently morning and night (every 12 hours) with soap and water. Use a topical antibiotic ointment and cover with a bandaid or gauze.  °  °Do NOT use rubbing alcohol or hydrogen peroxide, do not soak the area °  °Every attempt was made to remove foreign body (contaminants) from the wound.  However, there is always a chance that some may remain in the wound. This can  increase your risk of infection. °  °If you see signs of infection (warmth, redness, tenderness, pus, sharp increase in pain, fever, red streaking in the skin) immediately return to the emergency department. °  °After the wound heals fully, apply sunscreen for 6-12 months to minimize scarring.  ° °

## 2016-01-02 NOTE — ED Notes (Signed)
Little finger on right hand with clean dressing.

## 2016-01-02 NOTE — ED Notes (Signed)
PA at bedside.

## 2016-01-02 NOTE — ED Notes (Signed)
MD at bedside. 

## 2016-01-02 NOTE — ED Triage Notes (Signed)
Pt reports cutting his little finger on the left hand yesterday. He believes that there is still some glass in it. He is on coumadin. Last INR was checked yesterday. Finger is wrapped and not bleeding at triage.

## 2016-01-02 NOTE — ED Notes (Signed)
Patient transported to X-ray 

## 2016-01-14 ENCOUNTER — Ambulatory Visit: Payer: Medicare Other | Admitting: Pulmonary Disease

## 2016-01-15 DIAGNOSIS — I480 Paroxysmal atrial fibrillation: Secondary | ICD-10-CM | POA: Diagnosis not present

## 2016-01-15 DIAGNOSIS — R9431 Abnormal electrocardiogram [ECG] [EKG]: Secondary | ICD-10-CM | POA: Diagnosis not present

## 2016-01-22 ENCOUNTER — Other Ambulatory Visit: Payer: Self-pay | Admitting: Cardiology

## 2016-01-23 ENCOUNTER — Ambulatory Visit (INDEPENDENT_AMBULATORY_CARE_PROVIDER_SITE_OTHER): Payer: Medicare Other | Admitting: General Practice

## 2016-01-23 DIAGNOSIS — Z23 Encounter for immunization: Secondary | ICD-10-CM

## 2016-01-23 NOTE — Progress Notes (Signed)
Injection given.   Stacy J Burns, MD  

## 2016-02-07 ENCOUNTER — Encounter: Payer: Self-pay | Admitting: Pulmonary Disease

## 2016-02-07 ENCOUNTER — Ambulatory Visit (INDEPENDENT_AMBULATORY_CARE_PROVIDER_SITE_OTHER): Payer: Medicare Other | Admitting: Pulmonary Disease

## 2016-02-07 VITALS — BP 132/82 | HR 95 | Ht 74.0 in | Wt 270.8 lb

## 2016-02-07 DIAGNOSIS — G4733 Obstructive sleep apnea (adult) (pediatric): Secondary | ICD-10-CM | POA: Diagnosis not present

## 2016-02-07 DIAGNOSIS — J449 Chronic obstructive pulmonary disease, unspecified: Secondary | ICD-10-CM

## 2016-02-07 DIAGNOSIS — I2510 Atherosclerotic heart disease of native coronary artery without angina pectoris: Secondary | ICD-10-CM | POA: Diagnosis not present

## 2016-02-07 NOTE — Progress Notes (Signed)
Current Outpatient Prescriptions on File Prior to Visit  Medication Sig  . ALBUTEROL IN Inhale into the lungs.  [name] amLODipine (NORVASC) 5 MG tablet Take 1 tablet (5 mg total) by mouth daily.  [name] aspirin 81 MG chewable tablet Chew 1 tablet by mouth daily.  [name] atorvastatin (LIPITOR) 10 MG tablet TAKE 1 TABLET EVERY EVENING  . fluticasone furoate-vilanterol (BREO ELLIPTA) 200-25 MCG/INH AEPB Inhale into the lungs.  . nitroGLYCERIN (NITROSTAT) 0.4 MG SL tablet Place 1 tablet (0.4 mg total) under the tongue every 5 (five) minutes as needed for chest pain (up to 3 doses).  . pantoprazole (PROTONIX) 40 MG tablet Take 1 tablet (40 mg total) by mouth daily.  . sertraline (ZOLOFT) 50 MG tablet TAKE 1 TABLET DAILY  . traZODone (DESYREL) 50 MG tablet Reported on 05/31/2015  . vitamin B-12 (CYANOCOBALAMIN) 1000 MCG tablet Take 1,000 mcg by mouth daily.  [name] warfarin (COUMADIN) 3 MG tablet TAKE ONE AND ONE-HALF TO TWO TABLETS DAILY AS DIRECTED BY COUMADIN CLINIC   No current facility-administered medications on file prior to visit.      Chief Complaint  Patient presents with  . Follow-up    pr states he wears cpap avg 7-8hr nightly, feels pressure & mask are okay. DME:[facility]     Sleep tests HST 11/13/15 >> AHI 47.2, SaO2 low 70%.  Pulmonary tests: PFT 07/25/13 >> FEV1 2.94 (77%), FEV1% 69, TLC 6.38 (81%), DLCO 46% V/Q scan 08/02/13 >> low probability for PE Spirometry 08/21/14 >> FEV1 2.65 (68%), FEV1% 69 CT chest 08/29/14 >> atherosclerosis, mild paraseptal emphysema  Cardiac tests Echo 07/05/13 >> EF 60 to 65%  Past medical history BPH, CAD, HTN, PAF, PE/DVT 2010, GERD, HH, Gout, Spinal stenosis, Pneumonia  Past surgical history, Family history, Social history, Allergies all reviewed.  Vital Signs BP 132/82 (BP Location: Left Arm, Cuff Size: Normal)   Pulse 95   Ht 6\' 2"  (1.88 m)   Wt 270 lb 12.8 oz (122.8 kg)   SpO2 96%   BMI 34.77 kg/m   History of Present Illness [name] [name] is a 70  y.o. male with OSA and COPD.  He had sleep study which showed severe obstructive sleep apnea.  He has since been started on CPAP.  He has done well with this.  Main issue has been dryness in mouth.  He increased humidifier, but then got rain out.  He has been using breo and albuterol.  He doesn't think these help, and wants to try stopping.  He gets occasional cough and chest congestion.  Physical Exam  General - No distress ENT - No sinus tenderness, no oral exudate, no LAN Cardiac - s1s2 regular, no murmur Chest - faint rattle that cleared with cough Back - No focal tenderness Abd - Soft, non-tender Ext - No edema Neuro - Normal strength Skin - No rashes Psych - normal mood, and behavior   Assessment/Plan  Obstructive sleep apnea. - he is compliant with therapy, and reports benefit - will get his CPAP download - continue auto CPAP - discussed how to overcome rain out  COPD. - will have him try stopping inhalers since he has not notice benefit   Patient Instructions  Can try stopping breo and albuterol  Follow up in 1 year    Chesley Mires, MD [facility] Pager:  [phone] 02/07/2016, 2:51 PM

## 2016-02-07 NOTE — Patient Instructions (Signed)
Can try stopping breo and albuterol  Follow up in 1 year

## 2016-02-10 ENCOUNTER — Ambulatory Visit (INDEPENDENT_AMBULATORY_CARE_PROVIDER_SITE_OTHER): Payer: Medicare Other | Admitting: Pharmacist Clinician (PhC)/ Clinical Pharmacy Specialist

## 2016-02-10 DIAGNOSIS — I4891 Unspecified atrial fibrillation: Secondary | ICD-10-CM | POA: Diagnosis not present

## 2016-02-10 DIAGNOSIS — I480 Paroxysmal atrial fibrillation: Secondary | ICD-10-CM | POA: Diagnosis not present

## 2016-02-10 DIAGNOSIS — Z86718 Personal history of other venous thrombosis and embolism: Secondary | ICD-10-CM

## 2016-02-10 DIAGNOSIS — I2510 Atherosclerotic heart disease of native coronary artery without angina pectoris: Secondary | ICD-10-CM

## 2016-02-10 LAB — POCT INR: INR: 2

## 2016-02-12 DIAGNOSIS — M722 Plantar fascial fibromatosis: Secondary | ICD-10-CM | POA: Diagnosis not present

## 2016-02-13 ENCOUNTER — Telehealth: Payer: Self-pay | Admitting: Pulmonary Disease

## 2016-02-13 DIAGNOSIS — G4733 Obstructive sleep apnea (adult) (pediatric): Secondary | ICD-10-CM

## 2016-02-13 NOTE — Telephone Encounter (Signed)
Auto CPAP 01/12/16 to 02/10/16 >> used on 29 of 30 nights with average 8 hrs 1 min.  Average AHI 0.7 with median CPAP 10 and 95 th percentile CPAP 12 cm H2O   Will have my nurse inform pt that CPAP report looked good.

## 2016-02-14 NOTE — Telephone Encounter (Signed)
Spoke with patient wife, aware of results. Wife states that the patients CPAP mask is possibly leaking - she feels cold air blowing on her at night. No snoring noted. Pt not sleeping very well, having to take sleeping aides to help him sleep some nights - Trazodone 50mg . Pt has not taken this very often. Pt states that it will take him 2-3 hours to fall off to sleep. Pt states that the Trazodone is not working.  DME: [facility] Please advise Dr Halford Chessman. Thanks.

## 2016-02-17 NOTE — Telephone Encounter (Signed)
Please have his DME refit his CPAP mask.  Please have him increase trazodone to 100 mg qhs prn.

## 2016-02-17 NOTE — Telephone Encounter (Signed)
lmtcb X1 for pt.  Need to verify pharmacy for trazodone rx.  Order placed for cpap mask fit.    Routing to ashtyn to follow up on as this phone note was generated by VS.

## 2016-02-19 ENCOUNTER — Ambulatory Visit: Payer: Medicare Other | Admitting: Podiatry

## 2016-02-20 DIAGNOSIS — M722 Plantar fascial fibromatosis: Secondary | ICD-10-CM | POA: Diagnosis not present

## 2016-02-21 MED ORDER — TRAZODONE HCL 100 MG PO TABS: 100.0000 mg | ORAL_TABLET | Freq: Every day | ORAL | 2 refills | Status: DC

## 2016-02-21 NOTE — Telephone Encounter (Signed)
Pharmacy verified. Rx sent for new trazodone 100mg  dose. Pt aware that DME will be calling next week for a mas fitting. Nothing further needed.

## 2016-03-16 DIAGNOSIS — D1801 Hemangioma of skin and subcutaneous tissue: Secondary | ICD-10-CM | POA: Diagnosis not present

## 2016-03-16 DIAGNOSIS — L738 Other specified follicular disorders: Secondary | ICD-10-CM | POA: Diagnosis not present

## 2016-03-16 DIAGNOSIS — D235 Other benign neoplasm of skin of trunk: Secondary | ICD-10-CM | POA: Diagnosis not present

## 2016-03-16 DIAGNOSIS — L821 Other seborrheic keratosis: Secondary | ICD-10-CM | POA: Diagnosis not present

## 2016-03-23 ENCOUNTER — Ambulatory Visit (INDEPENDENT_AMBULATORY_CARE_PROVIDER_SITE_OTHER): Payer: Medicare Other | Admitting: Pharmacist

## 2016-03-23 DIAGNOSIS — Z86718 Personal history of other venous thrombosis and embolism: Secondary | ICD-10-CM | POA: Diagnosis not present

## 2016-03-23 DIAGNOSIS — I4891 Unspecified atrial fibrillation: Secondary | ICD-10-CM

## 2016-03-23 DIAGNOSIS — I480 Paroxysmal atrial fibrillation: Secondary | ICD-10-CM

## 2016-03-23 LAB — POCT INR: INR: 1.3

## 2016-04-13 ENCOUNTER — Other Ambulatory Visit: Payer: Self-pay | Admitting: Cardiology

## 2016-04-17 ENCOUNTER — Other Ambulatory Visit: Payer: Self-pay | Admitting: Orthopedic Surgery

## 2016-04-17 DIAGNOSIS — Z9889 Other specified postprocedural states: Secondary | ICD-10-CM | POA: Diagnosis not present

## 2016-04-17 DIAGNOSIS — M545 Low back pain: Secondary | ICD-10-CM

## 2016-04-17 DIAGNOSIS — Z96643 Presence of artificial hip joint, bilateral: Secondary | ICD-10-CM | POA: Diagnosis not present

## 2016-04-21 ENCOUNTER — Telehealth: Payer: Self-pay | Admitting: Pharmacist

## 2016-04-21 NOTE — Telephone Encounter (Signed)
Received fax from [facility] requesting clearance to hold warfarin 4 days prior to Lumbar myelogram CT  [name] [name] hx positive for Afib (CHADS2 = 1) with remote hx of provoked VTE >8 years ago after back surgery.   Okay to hold warfarin x 4 days prior to procedure and resume ASAP.  Fax sent to [facility] @ [phone]

## 2016-04-23 ENCOUNTER — Ambulatory Visit (INDEPENDENT_AMBULATORY_CARE_PROVIDER_SITE_OTHER): Payer: Medicare Other

## 2016-04-23 DIAGNOSIS — I480 Paroxysmal atrial fibrillation: Secondary | ICD-10-CM | POA: Diagnosis not present

## 2016-04-23 DIAGNOSIS — I4891 Unspecified atrial fibrillation: Secondary | ICD-10-CM | POA: Diagnosis not present

## 2016-04-23 DIAGNOSIS — Z86718 Personal history of other venous thrombosis and embolism: Secondary | ICD-10-CM | POA: Diagnosis not present

## 2016-04-23 LAB — POCT INR: INR: 1.2

## 2016-04-24 ENCOUNTER — Ambulatory Visit
Admission: RE | Admit: 2016-04-24 | Discharge: 2016-04-24 | Disposition: A | Discharge status: Home / Self Care | Payer: Medicare Other | Source: Ambulatory Visit | Attending: Orthopedic Surgery | Admitting: Orthopedic Surgery

## 2016-04-24 DIAGNOSIS — M545 Low back pain: Secondary | ICD-10-CM

## 2016-04-24 DIAGNOSIS — M48061 Spinal stenosis, lumbar region without neurogenic claudication: Secondary | ICD-10-CM | POA: Diagnosis not present

## 2016-04-24 MED ORDER — DIAZEPAM 5 MG PO TABS
5.0000 mg | ORAL_TABLET | Freq: Once | ORAL | Status: AC
  Administered 2016-04-24: 5 mg via ORAL

## 2016-04-24 MED ORDER — MEPERIDINE HCL 100 MG/ML IJ SOLN
75.0000 mg | Freq: Once | INTRAMUSCULAR | Status: AC
  Administered 2016-04-24: 75 mg via INTRAMUSCULAR

## 2016-04-24 MED ORDER — IOPAMIDOL (ISOVUE-M 200) INJECTION 41%
15.0000 mL | Freq: Once | INTRAMUSCULAR | Status: AC
  Administered 2016-04-24: 15 mL via INTRATHECAL

## 2016-04-24 MED ORDER — ONDANSETRON HCL 4 MG/2ML IJ SOLN
4.0000 mg | Freq: Once | INTRAMUSCULAR | Status: AC
  Administered 2016-04-24: 4 mg via INTRAMUSCULAR

## 2016-04-24 NOTE — Progress Notes (Signed)
Patient states he has been off Warfarin for the past four days.  INR is 1.2 as of 04/23/16.  He state he has been off Desyrel and Zoloft for at least the past two days.  jkl

## 2016-04-24 NOTE — Discharge Instructions (Signed)
Myelogram Discharge Instructions  1. Go home and rest quietly for the next 24 hours.  It is important to lie flat for the next 24 hours.  Get up only to go to the restroom.  You may lie in the bed or on a couch on your back, your stomach, your left side or your right side.  You may have one pillow under your head.  You may have pillows between your knees while you are on your side or under your knees while you are on your back.  2. DO NOT drive today.  Recline the seat as far back as it will go, while still wearing your seat belt, on the way home.  3. You may get up to go to the bathroom as needed.  You may sit up for 10 minutes to eat.  You may resume your normal diet and medications unless otherwise indicated.  Drink lots of extra fluids today and tomorrow.  4. The incidence of headache, nausea, or vomiting is about 5% (one in 20 patients).  If you develop a headache, lie flat and drink plenty of fluids until the headache goes away.  Caffeinated beverages may be helpful.  If you develop severe nausea and vomiting or a headache that does not go away with flat bed rest, call [phone].  5. You may resume normal activities after your 24 hours of bed rest is over; however, do not exert yourself strongly or do any heavy lifting tomorrow. If when you get up you have a headache when standing, go back to bed and force fluids for another 24 hours.  6. Call your physician for a follow-up appointment.  The results of your myelogram will be sent directly to your physician by the following day.  7. If you have any questions or if complications develop after you arrive home, please call [phone].  Discharge instructions have been explained to the patient.  The patient, or the person responsible for the patient, fully understands these instructions.       MAY RESUME COUMADIN / WARFARIN TODAY.   May resume Desyrel and Zoloft on Feb. 17, 2108, after 9:30 am.

## 2016-04-28 ENCOUNTER — Telehealth: Payer: Self-pay

## 2016-04-28 NOTE — Telephone Encounter (Signed)
Spoke with patient's wife after he had a myelogram here 04/24/16.  She states he did fine and did not get a spinal headache.  jkl

## 2016-04-30 DIAGNOSIS — M48061 Spinal stenosis, lumbar region without neurogenic claudication: Secondary | ICD-10-CM | POA: Diagnosis not present

## 2016-04-30 DIAGNOSIS — Z96643 Presence of artificial hip joint, bilateral: Secondary | ICD-10-CM | POA: Diagnosis not present

## 2016-04-30 DIAGNOSIS — Z9889 Other specified postprocedural states: Secondary | ICD-10-CM | POA: Diagnosis not present

## 2016-05-04 ENCOUNTER — Ambulatory Visit (INDEPENDENT_AMBULATORY_CARE_PROVIDER_SITE_OTHER): Payer: Medicare Other | Admitting: Pharmacist Clinician (PhC)/ Clinical Pharmacy Specialist

## 2016-05-04 DIAGNOSIS — Z86718 Personal history of other venous thrombosis and embolism: Secondary | ICD-10-CM

## 2016-05-04 DIAGNOSIS — I4891 Unspecified atrial fibrillation: Secondary | ICD-10-CM | POA: Diagnosis not present

## 2016-05-04 DIAGNOSIS — I480 Paroxysmal atrial fibrillation: Secondary | ICD-10-CM

## 2016-05-04 LAB — POCT INR: INR: 1.7

## 2016-05-08 DIAGNOSIS — M961 Postlaminectomy syndrome, not elsewhere classified: Secondary | ICD-10-CM | POA: Diagnosis not present

## 2016-05-08 DIAGNOSIS — M5137 Other intervertebral disc degeneration, lumbosacral region: Secondary | ICD-10-CM | POA: Diagnosis not present

## 2016-05-08 DIAGNOSIS — M5416 Radiculopathy, lumbar region: Secondary | ICD-10-CM | POA: Diagnosis not present

## 2016-05-08 DIAGNOSIS — M5136 Other intervertebral disc degeneration, lumbar region: Secondary | ICD-10-CM | POA: Diagnosis not present

## 2016-05-08 DIAGNOSIS — M5117 Intervertebral disc disorders with radiculopathy, lumbosacral region: Secondary | ICD-10-CM | POA: Diagnosis not present

## 2016-05-20 DIAGNOSIS — I480 Paroxysmal atrial fibrillation: Secondary | ICD-10-CM | POA: Diagnosis not present

## 2016-05-20 DIAGNOSIS — I491 Atrial premature depolarization: Secondary | ICD-10-CM | POA: Diagnosis not present

## 2016-05-30 ENCOUNTER — Other Ambulatory Visit: Payer: Self-pay | Admitting: Internal Medicine

## 2016-06-01 DIAGNOSIS — I480 Paroxysmal atrial fibrillation: Secondary | ICD-10-CM | POA: Diagnosis not present

## 2016-06-06 DIAGNOSIS — I480 Paroxysmal atrial fibrillation: Secondary | ICD-10-CM | POA: Diagnosis not present

## 2016-06-11 DIAGNOSIS — L718 Other rosacea: Secondary | ICD-10-CM | POA: Diagnosis not present

## 2016-06-23 DIAGNOSIS — J449 Chronic obstructive pulmonary disease, unspecified: Secondary | ICD-10-CM | POA: Diagnosis not present

## 2016-06-23 DIAGNOSIS — I480 Paroxysmal atrial fibrillation: Secondary | ICD-10-CM | POA: Diagnosis not present

## 2016-06-23 DIAGNOSIS — I2782 Chronic pulmonary embolism: Secondary | ICD-10-CM | POA: Diagnosis not present

## 2016-06-23 DIAGNOSIS — I2510 Atherosclerotic heart disease of native coronary artery without angina pectoris: Secondary | ICD-10-CM | POA: Diagnosis not present

## 2016-06-23 DIAGNOSIS — E6609 Other obesity due to excess calories: Secondary | ICD-10-CM | POA: Diagnosis not present

## 2016-06-23 DIAGNOSIS — Z955 Presence of coronary angioplasty implant and graft: Secondary | ICD-10-CM | POA: Diagnosis not present

## 2016-06-23 DIAGNOSIS — R0602 Shortness of breath: Secondary | ICD-10-CM | POA: Diagnosis not present

## 2016-06-23 DIAGNOSIS — R001 Bradycardia, unspecified: Secondary | ICD-10-CM | POA: Diagnosis not present

## 2016-06-23 DIAGNOSIS — I10 Essential (primary) hypertension: Secondary | ICD-10-CM | POA: Diagnosis not present

## 2016-06-23 DIAGNOSIS — Z8679 Personal history of other diseases of the circulatory system: Secondary | ICD-10-CM | POA: Diagnosis not present

## 2016-06-23 DIAGNOSIS — Z9889 Other specified postprocedural states: Secondary | ICD-10-CM | POA: Diagnosis not present

## 2016-06-23 DIAGNOSIS — Z79899 Other long term (current) drug therapy: Secondary | ICD-10-CM | POA: Diagnosis not present

## 2016-06-23 DIAGNOSIS — Z7901 Long term (current) use of anticoagulants: Secondary | ICD-10-CM | POA: Diagnosis not present

## 2016-06-26 DIAGNOSIS — E785 Hyperlipidemia, unspecified: Secondary | ICD-10-CM | POA: Diagnosis not present

## 2016-06-26 DIAGNOSIS — I428 Other cardiomyopathies: Secondary | ICD-10-CM | POA: Diagnosis not present

## 2016-06-26 DIAGNOSIS — Z9229 Personal history of other drug therapy: Secondary | ICD-10-CM | POA: Diagnosis not present

## 2016-06-26 DIAGNOSIS — Z8679 Personal history of other diseases of the circulatory system: Secondary | ICD-10-CM | POA: Diagnosis not present

## 2016-06-26 DIAGNOSIS — I10 Essential (primary) hypertension: Secondary | ICD-10-CM | POA: Diagnosis not present

## 2016-06-26 DIAGNOSIS — I4891 Unspecified atrial fibrillation: Secondary | ICD-10-CM | POA: Diagnosis not present

## 2016-06-26 DIAGNOSIS — I2510 Atherosclerotic heart disease of native coronary artery without angina pectoris: Secondary | ICD-10-CM | POA: Diagnosis not present

## 2016-06-26 DIAGNOSIS — Z01810 Encounter for preprocedural cardiovascular examination: Secondary | ICD-10-CM | POA: Diagnosis not present

## 2016-06-26 DIAGNOSIS — R0602 Shortness of breath: Secondary | ICD-10-CM | POA: Diagnosis not present

## 2016-06-26 DIAGNOSIS — I2782 Chronic pulmonary embolism: Secondary | ICD-10-CM | POA: Diagnosis not present

## 2016-06-26 DIAGNOSIS — E782 Mixed hyperlipidemia: Secondary | ICD-10-CM | POA: Diagnosis not present

## 2016-06-26 DIAGNOSIS — Z9889 Other specified postprocedural states: Secondary | ICD-10-CM | POA: Diagnosis not present

## 2016-06-26 DIAGNOSIS — Z955 Presence of coronary angioplasty implant and graft: Secondary | ICD-10-CM | POA: Diagnosis not present

## 2016-06-26 DIAGNOSIS — R0600 Dyspnea, unspecified: Secondary | ICD-10-CM | POA: Diagnosis not present

## 2016-06-28 DIAGNOSIS — Z7951 Long term (current) use of inhaled steroids: Secondary | ICD-10-CM | POA: Diagnosis not present

## 2016-06-28 DIAGNOSIS — W2209XA Striking against other stationary object, initial encounter: Secondary | ICD-10-CM | POA: Diagnosis not present

## 2016-06-28 DIAGNOSIS — I2510 Atherosclerotic heart disease of native coronary artery without angina pectoris: Secondary | ICD-10-CM | POA: Diagnosis not present

## 2016-06-28 DIAGNOSIS — J45909 Unspecified asthma, uncomplicated: Secondary | ICD-10-CM | POA: Diagnosis not present

## 2016-06-28 DIAGNOSIS — M79671 Pain in right foot: Secondary | ICD-10-CM | POA: Diagnosis not present

## 2016-06-28 DIAGNOSIS — J449 Chronic obstructive pulmonary disease, unspecified: Secondary | ICD-10-CM | POA: Diagnosis not present

## 2016-06-28 DIAGNOSIS — Z79899 Other long term (current) drug therapy: Secondary | ICD-10-CM | POA: Diagnosis not present

## 2016-06-28 DIAGNOSIS — Z87891 Personal history of nicotine dependence: Secondary | ICD-10-CM | POA: Diagnosis not present

## 2016-06-28 DIAGNOSIS — S90121A Contusion of right lesser toe(s) without damage to nail, initial encounter: Secondary | ICD-10-CM | POA: Diagnosis not present

## 2016-06-28 DIAGNOSIS — I10 Essential (primary) hypertension: Secondary | ICD-10-CM | POA: Diagnosis not present

## 2016-06-28 DIAGNOSIS — S9031XA Contusion of right foot, initial encounter: Secondary | ICD-10-CM | POA: Diagnosis not present

## 2016-06-28 DIAGNOSIS — M25571 Pain in right ankle and joints of right foot: Secondary | ICD-10-CM | POA: Diagnosis not present

## 2016-07-07 DIAGNOSIS — M545 Low back pain: Secondary | ICD-10-CM | POA: Diagnosis not present

## 2016-07-07 DIAGNOSIS — Z96643 Presence of artificial hip joint, bilateral: Secondary | ICD-10-CM | POA: Diagnosis not present

## 2016-07-07 DIAGNOSIS — M48061 Spinal stenosis, lumbar region without neurogenic claudication: Secondary | ICD-10-CM | POA: Diagnosis not present

## 2016-07-21 ENCOUNTER — Encounter: Payer: Self-pay | Admitting: Internal Medicine

## 2016-07-21 ENCOUNTER — Ambulatory Visit (INDEPENDENT_AMBULATORY_CARE_PROVIDER_SITE_OTHER): Payer: Medicare Other | Admitting: Internal Medicine

## 2016-07-21 ENCOUNTER — Other Ambulatory Visit (INDEPENDENT_AMBULATORY_CARE_PROVIDER_SITE_OTHER): Payer: Medicare Other

## 2016-07-21 VITALS — BP 124/74 | HR 96 | Temp 98.6°F | Resp 18 | Wt 268.0 lb

## 2016-07-21 DIAGNOSIS — E559 Vitamin D deficiency, unspecified: Secondary | ICD-10-CM | POA: Diagnosis not present

## 2016-07-21 DIAGNOSIS — D539 Nutritional anemia, unspecified: Secondary | ICD-10-CM

## 2016-07-21 DIAGNOSIS — I10 Essential (primary) hypertension: Secondary | ICD-10-CM | POA: Diagnosis not present

## 2016-07-21 DIAGNOSIS — R7303 Prediabetes: Secondary | ICD-10-CM

## 2016-07-21 DIAGNOSIS — R0609 Other forms of dyspnea: Secondary | ICD-10-CM | POA: Diagnosis not present

## 2016-07-21 DIAGNOSIS — D649 Anemia, unspecified: Secondary | ICD-10-CM

## 2016-07-21 DIAGNOSIS — E7800 Pure hypercholesterolemia, unspecified: Secondary | ICD-10-CM

## 2016-07-21 DIAGNOSIS — R5383 Other fatigue: Secondary | ICD-10-CM | POA: Diagnosis not present

## 2016-07-21 LAB — LIPID PANEL
CHOL/HDL RATIO: 3
Cholesterol: 114 mg/dL (ref 0–200)
HDL: 43.4 mg/dL (ref >39.00)
LDL Cholesterol: 50 mg/dL (ref 0–99)
NONHDL: 71.09
Triglycerides: 107 mg/dL (ref 0.0–149.0)
VLDL: 21.4 mg/dL (ref 0.0–40.0)

## 2016-07-21 LAB — COMPREHENSIVE METABOLIC PANEL
ALK PHOS: 74 U/L (ref 39–117)
ALT: 19 U/L (ref 0–53)
AST: 21 U/L (ref 0–37)
Albumin: 4.2 g/dL (ref 3.5–5.2)
BILIRUBIN TOTAL: 0.5 mg/dL (ref 0.2–1.2)
BUN: 14 mg/dL (ref 6–23)
CO2: 28 mEq/L (ref 19–32)
Calcium: 9.3 mg/dL (ref 8.4–10.5)
Chloride: 104 mEq/L (ref 96–112)
Creatinine, Ser: 1.03 mg/dL (ref 0.40–1.50)
GFR: 75.38 mL/min (ref >60.00)
GLUCOSE: 96 mg/dL (ref 70–99)
Potassium: 3.9 mEq/L (ref 3.5–5.1)
SODIUM: 139 meq/L (ref 135–145)
TOTAL PROTEIN: 7.6 g/dL (ref 6.0–8.3)

## 2016-07-21 LAB — CBC WITH DIFFERENTIAL/PLATELET
BASOS PCT: 1.4 % (ref 0.0–3.0)
Basophils Absolute: 0.2 10*3/uL — ABNORMAL HIGH (ref 0.0–0.1)
EOS ABS: 1 10*3/uL — AB (ref 0.0–0.7)
EOS PCT: 9 % — AB (ref 0.0–5.0)
HCT: 34 % — ABNORMAL LOW (ref 39.0–52.0)
Hemoglobin: 11.4 g/dL — ABNORMAL LOW (ref 13.0–17.0)
Lymphocytes Relative: 24.2 % (ref 12.0–46.0)
Lymphs Abs: 2.6 10*3/uL (ref 0.7–4.0)
MCHC: 33.7 g/dL (ref 30.0–36.0)
MCV: 104.1 fl — ABNORMAL HIGH (ref 78.0–100.0)
Monocytes Absolute: 1.1 10*3/uL — ABNORMAL HIGH (ref 0.1–1.0)
Monocytes Relative: 10 % (ref 3.0–12.0)
NEUTROS ABS: 6 10*3/uL (ref 1.4–7.7)
NEUTROS PCT: 55.4 % (ref 43.0–77.0)
PLATELETS: 447 10*3/uL — AB (ref 150.0–400.0)
RBC: 3.27 Mil/uL — ABNORMAL LOW (ref 4.22–5.81)
RDW: 16.6 % — ABNORMAL HIGH (ref 11.5–15.5)
WBC: 10.8 10*3/uL — AB (ref 4.0–10.5)

## 2016-07-21 LAB — VITAMIN B12: Vitamin B-12: 805 pg/mL (ref 211–911)

## 2016-07-21 LAB — IBC PANEL
Iron: 92 ug/dL (ref 42–165)
Saturation Ratios: 25.1 % (ref 20.0–50.0)
Transferrin: 262 mg/dL (ref 212.0–360.0)

## 2016-07-21 LAB — HEMOGLOBIN A1C: Hgb A1c MFr Bld: 6.2 % (ref 4.6–6.5)

## 2016-07-21 LAB — TSH: TSH: 1.86 u[IU]/mL (ref 0.35–4.50)

## 2016-07-21 LAB — VITAMIN D 25 HYDROXY (VIT D DEFICIENCY, FRACTURES): VITD: 11.1 ng/mL — ABNORMAL LOW (ref 30.00–100.00)

## 2016-07-21 NOTE — Assessment & Plan Note (Signed)
Check lipid panel  

## 2016-07-21 NOTE — Patient Instructions (Signed)
  Test(s) ordered today. Your results will be released to MyChart (or called to you) after review, usually within 72hours after test completion. If any changes need to be made, you will be notified at that same time.    Medications reviewed and updated.  No changes recommended at this time.     

## 2016-07-21 NOTE — Assessment & Plan Note (Signed)
Check a1c Low sugar / carb diet Stressed regular exercise, weight loss  

## 2016-07-21 NOTE — Assessment & Plan Note (Signed)
Check vitamin d level Not currently taking vitamin D

## 2016-07-21 NOTE — Assessment & Plan Note (Signed)
BP well controlled Current regimen effective and well tolerated Continue current medications at current doses  

## 2016-07-21 NOTE — Progress Notes (Signed)
Subjective:    Patient ID: [name] [name], male    DOB: October 05, 1945, 71 y.o.   MRN: [id number]  HPI He is here for an acute visit.   His wife says he is tired, sleeping too much and has no energy.  He is sleeping good at night and uses his cpap nightly.  He sleeps 10:30 - 6:30 -7.  He feels exhausted throughout the day.  He will nap 1-3 times a day.     b/l hip pain:  He has b/l hip pain and it is painful to walk.  He saw ortho.  He thinks it is his back.  He has an appointment to have his back further evaluated.  He is very inactive due to the pain.    SOB with getting up and walking to another room.  He has DOE with mild activity.  He is obese and inactive.  He also has COPD.  He has tried several inhalers and none of them have improved his SOB.  Plantar fasciitis:  b/l heel pain.  He is following with podiatry.  He has pain in both of his heels.  He has had an injection in the past.    He was in [place] recently.  He saw his EP doctor when he was there.  He does go in and out of Afib and the longest duration was < 2 minutes.  He did not feel any further treatment was needed.  He had a stress test and everything looked ok.    Medications and allergies reviewed with patient and updated if appropriate.  Patient Active Problem List   Diagnosis Date Noted  . OSA (obstructive sleep apnea) 11/14/2015  . Pneumonia 10/24/2015  . Sepsis ([place]) 10/24/2015  . Prediabetes 04/17/2015  . COPD GOLD II 08/21/2014  . COPD exacerbation ([place]) 08/21/2014  . Anemia 08/20/2014  . Hyperlipidemia 08/20/2014  . Hyperglycemia 08/20/2014  . Spinal stenosis of lumbar region 05/30/2014  . Abnormal CT of the abdomen 05/30/2014  . Dizziness 02/08/2014  . Macrocytic anemia 08/08/2013  . CAD (coronary artery disease)   . Dyspnea 08/07/2013  . AAA (abdominal aortic aneurysm) without rupture ([place]) 05/02/2013  . Obesity (BMI 30-39.9) 03/21/2013  . Family history of malignant neoplasm of gastrointestinal  tract 08/25/2011  . 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 Fatigue 03/24/2011  . DOE (dyspnea on exertion) 03/12/2011  . Warfarin anticoagulation 03/11/2011  . INSOMNIA-SLEEP DISORDER-UNSPEC 09/12/2009  . CERVICAL RADICULOPATHY, RIGHT 07/24/2009  . DEGENERATIVE JOINT DISEASE, ADVANCED 05/21/2009  . AVASCULAR NECROSIS 05/21/2009  . ALLERGIC RHINITIS CAUSE UNSPECIFIED 11/01/2008  . PULMONARY NODULE, RIGHT LOWER LOBE 10/25/2008  . DIVERTICULOSIS, COLON 10/07/2008  . 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 PULMONARY EMBOLISM, HX OF 10/05/2008  . EDEMA- LOCALIZED 05/07/2008  . ASTHMA 02/20/2008  . OTHER AND UNSPECIFIED HYPERLIPIDEMIA 09/08/2007  . HYPERPLASIA PROSTATE UNS W/UR OBST & OTH LUTS 06/28/2007  . PAF (paroxysmal atrial fibrillation) ([place]) 06/25/2007  . NOCTURIA 05/12/2007  . COLONIC POLYPS 04/01/2006  . GOUT 04/01/2006  . Essential hypertension 04/01/2006  . ACID REFLUX DISEASE 04/01/2006    Current Outpatient Prescriptions on File Prior to Visit  Medication Sig Dispense Refill  . amLODipine (NORVASC) 5 MG tablet Take 1 tablet (5 mg total) by mouth daily. 90 tablet 3  . aspirin 81 MG chewable tablet Chew 1 tablet by mouth daily.    [name] atorvastatin (LIPITOR) 10 MG tablet TAKE 1 TABLET EVERY EVENING 90 tablet 3  . nitroGLYCERIN (NITROSTAT) 0.4  MG SL tablet Place 1 tablet (0.4 mg total) under the tongue every 5 (five) minutes as needed for chest pain (up to 3 doses). 25 tablet 4  . pantoprazole (PROTONIX) 40 MG tablet TAKE 1 TABLET DAILY 90 tablet 1  . sertraline (ZOLOFT) 50 MG tablet Take 1 tablet (50 mg total) by mouth daily. --- Office visit needed for further refills 90 tablet 0  . traZODone (DESYREL) 100 MG tablet Take 1 tablet (100 mg total) by mouth at bedtime. 30 tablet 2  . vitamin B-12 (CYANOCOBALAMIN) 1000 MCG tablet Take 1,000 mcg by mouth daily.    [name] warfarin (COUMADIN) 3 MG tablet TAKE ONE AND ONE-HALF TO TWO TABLETS DAILY AS DIRECTED BY  COUMADIN CLINIC 180 tablet 1   No current facility-administered medications on file prior to visit.     Past Medical History:  Diagnosis Date  . Arthritis   . Asthma   . BPH (benign prostatic hypertrophy)   . CAD (coronary artery disease)    a. 07/2013: s/p DES to LAD, normal LVF.  [name] Complication of anesthesia    "I got all kinds of hallucinations"  . COPD (chronic obstructive pulmonary disease) ([place])    a. 07/2013 PFT's mild airflow obstruction, no restriction, sev decrease in DLCO.  . Diverticulosis   . DVT (deep venous thrombosis) ([place])    a. 2010 Lower ext s/p back surgery.  [name] Dyspnea on exertion    a. 07/2013 PFT's mild airflow obstr   . GERD (gastroesophageal reflux disease)   . History of gout   . History of hiatal hernia   . Hx of echocardiogram 2015   Echo (06/2013): EF 60-65% normal wall motion, normal diastolic function, aortic sclerosis without stenosis, Trivial MR, mild SAM due to long, redundant mitral leaflets, mild RAE, normal RVSF  . Hypertension   . Macrocytic anemia    a. 07/2013: documented on prior labs.  . Obesity   . OSA (obstructive sleep apnea) 11/14/2015  . PAF (paroxysmal atrial fibrillation) ([place])    a. Flecainide discontinued 07/2013 in setting of CAD.; b. s/p PVI Ablation at [facility] (Dr Joseph Berkshire) 11/2014  . Pneumonia ~ 2010 X 1  . Pulmonary embolism ([place])    a. 2010 in setting of DVT post-op back surgery. b. Low prob VQ 07/2013.  [name] Spinal stenosis    [place]  . Transaminitis    a. 07/2013: mild.  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 ANTERIOR LUMBAR [place] ARTHROPLASTY  03/2008   "spacer poped out; had to repair"  . CATARACT EXTRACTION W/ INTRAOCULAR LENS  IMPLANT, BILATERAL Bilateral   . COLONOSCOPY W/ POLYPECTOMY  2004  . Colonoscopy with polypectomy  09/2011   2 tubular adenomas  . CORONARY ANGIOPLASTY WITH STENT PLACEMENT  08/08/2013   "1"  . JOINT REPLACEMENT    . LEFT AND RIGHT  HEART CATHETERIZATION WITH CORONARY ANGIOGRAM N/A 08/07/2013   Procedure: LEFT AND RIGHT HEART CATHETERIZATION WITH CORONARY ANGIOGRAM;  Surgeon: Peter M Martinique, MD;  Location[facility] C[facility] LAB;  Service: Cardiovascular;  Laterality: N/A;  . LUMBAR FUSION  02/2008  . PERCUTANEOUS CORONARY STENT INTERVENTION (PCI-S)  08/07/2013   Procedure: PERCUTANEOUS CORONARY STENT INTERVENTION (PCI-S);  Surgeon: Peter M Martinique, MD;  Location: [facility] CATH LAB;  Service: Cardiovascular;;  . PVI ablation  11/2014   Dr. Venita Sheffield [facility]  . TOTAL HIP ARTHROPLASTY Bilateral   . VASECTOMY  Social History   Social History  . Marital status: Married    Spouse name: N/A  . Number of children: 4  . Years of education: N/A   Occupational History  . Retired from [employer] Retired   Social History Main Topics  . Smoking status: Former Smoker    Packs/day: 2.00    Years: 40.00    Types: Cigarettes    Quit date: 03/09/2000  . Smokeless tobacco: Never Used     Comment: smoked 1964-2002, up to 3 ppd  . Alcohol use 0.0 oz/week     Comment: 1-2 beers every 2 weeks (10/30/15) /// 02/08/2014 "drink a couple times/month"  . Drug use: No  . Sexual activity: Not on file     Comment: "sexually hx is none of your business" (02/08/2014)   Other Topics Concern  . Not on file   Social History Narrative  . No narrative on file    Family History  Problem Relation Age of Onset  . Heart attack Paternal Grandfather        Over 47  . Atrial fibrillation Mother   . COPD Mother   . Heart disease Father   . Colon cancer Father   . Heart attack Brother 17  . Alcohol abuse Brother 2       Died post liver transplant  . Heart attack Paternal Uncle        Less than 61  . Stroke Neg Hx     Review of Systems  Constitutional: Positive for fatigue. Negative for chills and fever.  Respiratory: Positive for cough (dry, constant) and shortness of breath (with exertion). Negative for wheezing.   Cardiovascular: Positive for  leg swelling (ankle). Negative for chest pain and palpitations.  Gastrointestinal: Negative for abdominal pain, blood in stool, constipation, diarrhea and nausea.       Gerd controlled  Genitourinary: Negative for dysuria and hematuria.  Neurological: Positive for numbness (left leg since back surgery). Negative for light-headedness and headaches.  Psychiatric/Behavioral: Negative for dysphoric mood.       Objective:   Vitals:   07/21/16 1540  BP: 124/74  Pulse: 96  Resp: 18  Temp: 98.6 F (37 C)   Filed Weights   07/21/16 1540  Weight: 268 lb (121.6 kg)   Body mass index is 34.41 kg/m.  Wt Readings from Last 3 Encounters:  07/21/16 268 lb (121.6 kg)  02/07/16 270 lb 12.8 oz (122.8 kg)  01/02/16 260 lb (117.9 kg)     Physical Exam Constitutional: Appears well-developed and well-nourished. No distress.  HENT:  Head: Normocephalic and atraumatic.  Neck: Neck supple. No tracheal deviation present. No thyromegaly present.  No cervical lymphadenopathy Cardiovascular: Normal rate, regular rhythm and normal heart sounds.   No murmur heard. No carotid bruit .  No edema Pulmonary/Chest: Effort normal and breath sounds normal. No respiratory distress. No has no wheezes. No rales.  Skin: Skin is warm and dry. Not diaphoretic.  Psychiatric: Normal mood and affect. Behavior is normal.         Assessment & Plan:   See Problem List for Assessment and Plan of chronic medical problems.

## 2016-07-21 NOTE — Assessment & Plan Note (Signed)
Still having DOE Does not feel inhalers have helped and does not want to try another one Just saw cardiology/EPS and no cardiac cause for DOE/fatigue Check labs Increase exercise, weight loss

## 2016-07-21 NOTE — Assessment & Plan Note (Signed)
?   cause Does not feel depressed.  Does not feel COPD is contributing - no improvement with inhalers, just saw cardio - heart is good Taking B12 Compliant with cpap Check labs stressed the need to increase exercise

## 2016-07-21 NOTE — Assessment & Plan Note (Signed)
Check cbc, iron/ferritin/ B12

## 2016-07-21 NOTE — Assessment & Plan Note (Signed)
Check labs 

## 2016-07-23 ENCOUNTER — Encounter: Payer: Self-pay | Admitting: Internal Medicine

## 2016-07-23 ENCOUNTER — Other Ambulatory Visit: Payer: Self-pay | Admitting: Internal Medicine

## 2016-07-23 DIAGNOSIS — M722 Plantar fascial fibromatosis: Secondary | ICD-10-CM | POA: Diagnosis not present

## 2016-07-23 MED ORDER — VITAMIN D (ERGOCALCIFEROL) 1.25 MG (50000 UNIT) PO CAPS: 50000.0000 [IU] | ORAL_CAPSULE | ORAL | 0 refills | Status: DC

## 2016-07-28 ENCOUNTER — Ambulatory Visit (INDEPENDENT_AMBULATORY_CARE_PROVIDER_SITE_OTHER): Payer: Medicare Other | Admitting: Pharmacist Clinician (PhC)/ Clinical Pharmacy Specialist

## 2016-07-28 DIAGNOSIS — I480 Paroxysmal atrial fibrillation: Secondary | ICD-10-CM

## 2016-07-28 DIAGNOSIS — Z86718 Personal history of other venous thrombosis and embolism: Secondary | ICD-10-CM

## 2016-07-28 DIAGNOSIS — I4891 Unspecified atrial fibrillation: Secondary | ICD-10-CM

## 2016-07-28 LAB — POCT INR: INR: 1.6

## 2016-08-04 DIAGNOSIS — J449 Chronic obstructive pulmonary disease, unspecified: Secondary | ICD-10-CM | POA: Diagnosis not present

## 2016-08-04 DIAGNOSIS — I2510 Atherosclerotic heart disease of native coronary artery without angina pectoris: Secondary | ICD-10-CM | POA: Diagnosis not present

## 2016-08-04 DIAGNOSIS — Z8679 Personal history of other diseases of the circulatory system: Secondary | ICD-10-CM | POA: Diagnosis not present

## 2016-08-04 DIAGNOSIS — Z7901 Long term (current) use of anticoagulants: Secondary | ICD-10-CM | POA: Diagnosis not present

## 2016-08-04 DIAGNOSIS — R0602 Shortness of breath: Secondary | ICD-10-CM | POA: Diagnosis not present

## 2016-08-04 DIAGNOSIS — E782 Mixed hyperlipidemia: Secondary | ICD-10-CM | POA: Diagnosis not present

## 2016-08-04 DIAGNOSIS — Z9889 Other specified postprocedural states: Secondary | ICD-10-CM | POA: Diagnosis not present

## 2016-08-04 DIAGNOSIS — E6609 Other obesity due to excess calories: Secondary | ICD-10-CM | POA: Diagnosis not present

## 2016-08-04 DIAGNOSIS — R001 Bradycardia, unspecified: Secondary | ICD-10-CM | POA: Diagnosis not present

## 2016-08-04 DIAGNOSIS — I2782 Chronic pulmonary embolism: Secondary | ICD-10-CM | POA: Diagnosis not present

## 2016-08-04 DIAGNOSIS — Z955 Presence of coronary angioplasty implant and graft: Secondary | ICD-10-CM | POA: Diagnosis not present

## 2016-08-04 DIAGNOSIS — I10 Essential (primary) hypertension: Secondary | ICD-10-CM | POA: Diagnosis not present

## 2016-08-04 DIAGNOSIS — I480 Paroxysmal atrial fibrillation: Secondary | ICD-10-CM | POA: Diagnosis not present

## 2016-08-14 ENCOUNTER — Ambulatory Visit (INDEPENDENT_AMBULATORY_CARE_PROVIDER_SITE_OTHER): Payer: Medicare Other | Admitting: Pharmacist

## 2016-08-14 DIAGNOSIS — Z86718 Personal history of other venous thrombosis and embolism: Secondary | ICD-10-CM

## 2016-08-14 DIAGNOSIS — I4891 Unspecified atrial fibrillation: Secondary | ICD-10-CM

## 2016-08-14 DIAGNOSIS — I480 Paroxysmal atrial fibrillation: Secondary | ICD-10-CM

## 2016-08-14 LAB — POCT INR: INR: 2.1

## 2016-08-24 ENCOUNTER — Telehealth: Payer: Self-pay | Admitting: Internal Medicine

## 2016-08-24 NOTE — Telephone Encounter (Signed)
Fine with me - note I had rec cpst in 2017 if not improving but that was never done

## 2016-08-24 NOTE — Telephone Encounter (Signed)
BQ please advise if you're ok with taking on this patient.  This recommendation came from [facility].  Thanks!

## 2016-08-24 NOTE — Telephone Encounter (Signed)
Please advise if okay to switch to BQ for pulm, thanks

## 2016-08-25 NOTE — Telephone Encounter (Signed)
Pt has been scheduled with BQ on 10/08/16 @ 2:15.  Pt's wife, Santiago Glad, is aware and voiced her understanding. Nothing further needed.

## 2016-08-25 NOTE — Telephone Encounter (Signed)
OK by me 30 min visit only 

## 2016-08-26 DIAGNOSIS — M2392 Unspecified internal derangement of left knee: Secondary | ICD-10-CM | POA: Diagnosis not present

## 2016-08-26 DIAGNOSIS — M1712 Unilateral primary osteoarthritis, left knee: Secondary | ICD-10-CM | POA: Diagnosis not present

## 2016-08-26 DIAGNOSIS — M25562 Pain in left knee: Secondary | ICD-10-CM | POA: Diagnosis not present

## 2016-08-31 DIAGNOSIS — M79671 Pain in right foot: Secondary | ICD-10-CM | POA: Diagnosis not present

## 2016-08-31 DIAGNOSIS — M79672 Pain in left foot: Secondary | ICD-10-CM | POA: Diagnosis not present

## 2016-08-31 DIAGNOSIS — M722 Plantar fascial fibromatosis: Secondary | ICD-10-CM | POA: Diagnosis not present

## 2016-09-02 DIAGNOSIS — H10413 Chronic giant papillary conjunctivitis, bilateral: Secondary | ICD-10-CM | POA: Diagnosis not present

## 2016-09-02 DIAGNOSIS — Z961 Presence of intraocular lens: Secondary | ICD-10-CM | POA: Diagnosis not present

## 2016-09-02 DIAGNOSIS — D3132 Benign neoplasm of left choroid: Secondary | ICD-10-CM | POA: Diagnosis not present

## 2016-09-08 ENCOUNTER — Ambulatory Visit (INDEPENDENT_AMBULATORY_CARE_PROVIDER_SITE_OTHER): Payer: Medicare Other | Admitting: Cardiology

## 2016-09-08 ENCOUNTER — Encounter: Payer: Self-pay | Admitting: Cardiology

## 2016-09-08 ENCOUNTER — Ambulatory Visit (INDEPENDENT_AMBULATORY_CARE_PROVIDER_SITE_OTHER): Payer: Medicare Other | Admitting: Pharmacist

## 2016-09-08 VITALS — BP 132/80 | HR 78 | Ht 74.0 in | Wt 269.0 lb

## 2016-09-08 DIAGNOSIS — I480 Paroxysmal atrial fibrillation: Secondary | ICD-10-CM

## 2016-09-08 DIAGNOSIS — Z86718 Personal history of other venous thrombosis and embolism: Secondary | ICD-10-CM

## 2016-09-08 DIAGNOSIS — I10 Essential (primary) hypertension: Secondary | ICD-10-CM | POA: Diagnosis not present

## 2016-09-08 DIAGNOSIS — I4891 Unspecified atrial fibrillation: Secondary | ICD-10-CM | POA: Diagnosis not present

## 2016-09-08 DIAGNOSIS — I2510 Atherosclerotic heart disease of native coronary artery without angina pectoris: Secondary | ICD-10-CM

## 2016-09-08 LAB — POCT INR: INR: 2.6

## 2016-09-08 NOTE — Patient Instructions (Signed)
Dr Hochrein recommends that you schedule a follow-up appointment in 12 months. You will receive a reminder letter in the mail two months in advance. If you don't receive a letter, please call our office to schedule the follow-up appointment.  If you need a refill on your cardiac medications before your next appointment, please call your pharmacy. 

## 2016-09-08 NOTE — Progress Notes (Signed)
HPI  The patient presents for follow up of atrial fibrillation, HTN, prior DVT and pulmonary emboli and CAD.   He has received some of his care at [facility] as outline previously including atrial fib ablation. He had follow-up the [facility] clinic earlier this year. He had an EP evaluation and did have some short paroxysms of fibrillation on the monitor apparently. However, these of been only mildly symptomatic. No change in therapy was suggested. He had a negative perfusion study. He had an echocardiogram. No significant abnormalities. He is limited by plantar fasciitis and joint pain. He's not able to exercise as much as I would like.  However, with his activities of daily living he denies any acute cardiovascular symptoms. He has no chest pressure, neck or arm discomfort. He's had no change in his chronic dyspnea. He has no PND or orthopnea. He's had no presyncope or syncope.   I reviewed the records from [facility] for this visit.     Allergies  Allergen Reactions  . Penicillins Anaphylaxis     Because of a history of documented adverse serious drug reaction;Medi Alert bracelet  is recommended    Current Outpatient Prescriptions  Medication Sig Dispense Refill  . amLODipine (NORVASC) 5 MG tablet Take 1 tablet (5 mg total) by mouth daily. 90 tablet 3  . aspirin 81 MG chewable tablet Chew 1 tablet by mouth daily.    [name] atorvastatin (LIPITOR) 10 MG tablet TAKE 1 TABLET EVERY EVENING 90 tablet 3  . nitroGLYCERIN (NITROSTAT) 0.4 MG SL tablet Place 1 tablet (0.4 mg total) under the tongue every 5 (five) minutes as needed for chest pain (up to 3 doses). 25 tablet 4  . pantoprazole (PROTONIX) 40 MG tablet TAKE 1 TABLET DAILY 90 tablet 1  . sertraline (ZOLOFT) 50 MG tablet Take 1 tablet (50 mg total) by mouth daily. --- Office visit needed for further refills 90 tablet 0  . traZODone (DESYREL) 100 MG tablet Take 1 tablet (100 mg total) by mouth at bedtime. 30 tablet 2  . vitamin  B-12 (CYANOCOBALAMIN) 1000 MCG tablet Take 1,000 mcg by mouth daily.    . Vitamin D, Ergocalciferol, (DRISDOL) 50000 units CAPS capsule Take 1 capsule (50,000 Units total) by mouth every 7 (seven) days. 12 capsule 0  . warfarin (COUMADIN) 3 MG tablet TAKE ONE AND ONE-HALF TO TWO TABLETS DAILY AS DIRECTED BY COUMADIN CLINIC 180 tablet 1   No current facility-administered medications for this visit.     Past Medical History:  Diagnosis Date  . Arthritis   . Asthma   . BPH (benign prostatic hypertrophy)   . CAD (coronary artery disease)    a. 07/2013: s/p DES to LAD, normal LVF.  [name] Complication of anesthesia    "I got all kinds of hallucinations"  . COPD (chronic obstructive pulmonary disease) ([place])    a. 07/2013 PFT's mild airflow obstruction, no restriction, sev decrease in DLCO.  . Diverticulosis   . DVT (deep venous thrombosis) ([place])    a. 2010 Lower ext s/p back surgery.  [name] Dyspnea on exertion    a. 07/2013 PFT's mild airflow obstr   . GERD (gastroesophageal reflux disease)   . History of gout   . History of hiatal hernia   . Hx of echocardiogram 2015   Echo (06/2013): EF 60-65% normal wall motion, normal diastolic function, aortic sclerosis without stenosis, Trivial MR, mild SAM due to long, redundant mitral leaflets, mild RAE, normal RVSF  . Hypertension   .  Macrocytic anemia    a. 07/2013: documented on prior labs.  . Obesity   . OSA (obstructive sleep apnea) 11/14/2015  . PAF (paroxysmal atrial fibrillation) ([place])    a. Flecainide discontinued 07/2013 in setting of CAD.; b. s/p PVI Ablation at [facility] (Dr Joseph Berkshire) 11/2014  . Pneumonia ~ 2010 X 1  . Pulmonary embolism ([place])    a. 2010 in setting of DVT post-op back surgery. b. Low prob VQ 07/2013.  [name] Spinal stenosis    Congential  . Transaminitis    a. 07/2013: mild.  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 ANTERIOR LUMBAR [place] ARTHROPLASTY  03/2008   "spacer poped  out; had to repair"  . CATARACT EXTRACTION W/ INTRAOCULAR LENS  IMPLANT, BILATERAL Bilateral   . COLONOSCOPY W/ POLYPECTOMY  2004  . Colonoscopy with polypectomy  09/2011   2 tubular adenomas  . CORONARY ANGIOPLASTY WITH STENT PLACEMENT  08/08/2013   "1"  . JOINT REPLACEMENT    . LEFT AND RIGHT HEART CATHETERIZATION WITH CORONARY ANGIOGRAM N/A 08/07/2013   Procedure: LEFT AND RIGHT HEART CATHETERIZATION WITH CORONARY ANGIOGRAM;  Surgeon: Peter M Martinique, MD;  Location: [facility] CATH LAB;  Service: Cardiovascular;  Laterality: N/A;  . LUMBAR FUSION  02/2008  . PERCUTANEOUS CORONARY STENT INTERVENTION (PCI-S)  08/07/2013   Procedure: PERCUTANEOUS CORONARY STENT INTERVENTION (PCI-S);  Surgeon: Peter M Martinique, MD;  Location: [facility] CATH LAB;  Service: Cardiovascular;;  . PVI ablation  11/2014   Dr. Venita Sheffield [facility]  . TOTAL HIP ARTHROPLASTY Bilateral   . VASECTOMY      ROS:  As stated in the HPI and negative for all other systems.  PHYSICAL EXAM BP 132/80   Pulse 78   Ht 6\' 2"  (1.88 m)   Wt 269 lb (122 kg)   BMI 34.54 kg/m   GENERAL:  Well appearing NECK:  No jugular venous distention, waveform within normal limits, carotid upstroke brisk and symmetric, no bruits, no thyromegaly LUNGS:  Clear to auscultation bilaterally BACK:  No CVA tenderness CHEST:  Unremarkable HEART:  PMI not displaced or sustained,S1 and S2 within normal limits, no S3, no S4, no clicks, no rubs, no murmurs ABD:  Flat, positive bowel sounds normal in frequency in pitch, no bruits, no rebound, no guarding, no midline pulsatile mass, no hepatomegaly, no splenomegaly EXT:  2 plus pulses throughout, no edema, no cyanosis no clubbing   EKG:  Sinus rhythm, rate 78, axis within normal limits, early transition in lead V2, no acute ST-T wave changes.  09/09/2016   ASSESSMENT AND PLAN  ATRIAL FIBRILLATION:    He has had some paroxysms.  However these are short lived. No change in therapy is indicated.  His INR was  checked today.  DYSPNEA:    There has been no cardiac etiology identified despite an extensive workup. He has follow-up with Dr. Lake Bells next month.  AAA:     This was 3.1. No further imaging is indicated.  CAD:  He had negative stress test earlier this year. He will continue with risk reduction.  We are making the decision to continue both aspirin and warfarin.   HTN:   The blood pressure is at target. No change in medications is indicated. We will continue with therapeutic lifestyle changes (TLC).  MR:    This was not significant on his echo done at the [facility] clinic earlier this year. This will be followed clinically.  SLEEP APNEA:  Follow up per Dr. Halford Chessman.

## 2016-09-09 ENCOUNTER — Encounter: Payer: Self-pay | Admitting: Cardiology

## 2016-09-23 DIAGNOSIS — M545 Low back pain: Secondary | ICD-10-CM | POA: Diagnosis not present

## 2016-09-23 DIAGNOSIS — M1712 Unilateral primary osteoarthritis, left knee: Secondary | ICD-10-CM | POA: Diagnosis not present

## 2016-09-23 DIAGNOSIS — M48061 Spinal stenosis, lumbar region without neurogenic claudication: Secondary | ICD-10-CM | POA: Diagnosis not present

## 2016-09-30 DIAGNOSIS — L814 Other melanin hyperpigmentation: Secondary | ICD-10-CM | POA: Diagnosis not present

## 2016-09-30 DIAGNOSIS — D225 Melanocytic nevi of trunk: Secondary | ICD-10-CM | POA: Diagnosis not present

## 2016-09-30 DIAGNOSIS — L820 Inflamed seborrheic keratosis: Secondary | ICD-10-CM | POA: Diagnosis not present

## 2016-10-06 ENCOUNTER — Ambulatory Visit: Payer: Medicare Other | Admitting: Internal Medicine

## 2016-10-06 ENCOUNTER — Ambulatory Visit (INDEPENDENT_AMBULATORY_CARE_PROVIDER_SITE_OTHER): Payer: Medicare Other | Admitting: Internal Medicine

## 2016-10-06 ENCOUNTER — Encounter: Payer: Self-pay | Admitting: Internal Medicine

## 2016-10-06 VITALS — BP 138/78 | HR 78 | Temp 98.0°F | Resp 18 | Wt 274.0 lb

## 2016-10-06 DIAGNOSIS — M898X8 Other specified disorders of bone, other site: Secondary | ICD-10-CM

## 2016-10-06 DIAGNOSIS — I2510 Atherosclerotic heart disease of native coronary artery without angina pectoris: Secondary | ICD-10-CM | POA: Diagnosis not present

## 2016-10-06 DIAGNOSIS — M722 Plantar fascial fibromatosis: Secondary | ICD-10-CM | POA: Insufficient documentation

## 2016-10-06 MED ORDER — PREDNISONE 10 MG PO TABS
ORAL_TABLET | ORAL | 0 refills | Status: DC
Start: 2016-10-06 — End: 2016-11-02

## 2016-10-06 NOTE — Assessment & Plan Note (Addendum)
On left, no obvious cause Cortisone injection helped 25%, no relief from vicodin or robaxin Likely muscular or tendinitis Will likely need to do PT/exercises Apply ice Given degree of discomfort I will give him a prednisone taper - he has tolerated this in the past well - will have INR checked next week Take prednisone with food in the morning Will refer to Dr Tamala Julian for further eval/treatment

## 2016-10-06 NOTE — Progress Notes (Signed)
Subjective:    Patient ID: [name] [name], male    DOB: Sep 02, 1945, 71 y.o.   MRN: [id number]  HPI He is here for an acute visit.    He has had a pain on his left iliac crest.  He saw ortho last week and his hips are good - the xrays were normal.  He had a shot of cortisone and was prescribed vicodin and a muscle relaxer.  The injection helped a little.  The pills do not help.  He still has pain with walking.  The pain has not gotten worse.  It has gotten a little better.  Sometimes it feels like he is being stuck by a knife or hit by a bat in that area.  He deneis skin changes. He has not had any pain at rest.  The pain is on the lateral hip/pelvic bone and radiates to the groin.  Any movement makes it worse.  Walking is very difficult.  Laying down and flipping from one side to the other makes it worse.  The injection helped 25%.  He has numbness/tinlging in the area.  He denies increased back pain.  He is not exercising.  He denies any new activities that may have cause this.  He is sedentary.   He wondered if he needed the sertraline.  He was put on it years ago.  He denies depression or anxiety.  He would like to come off of it if possible.   Medications and allergies reviewed with patient and updated if appropriate.  Patient Active Problem List   Diagnosis Date Noted  . Vitamin D deficiency 07/21/2016  . OSA (obstructive sleep apnea) 11/14/2015  . Pneumonia 10/24/2015  . Sepsis ([place]) 10/24/2015  . Prediabetes 04/17/2015  . COPD GOLD II 08/21/2014  . COPD exacerbation ([place]) 08/21/2014  . Anemia 08/20/2014  . Hyperlipidemia 08/20/2014  . Spinal stenosis of lumbar region 05/30/2014  . Abnormal CT of the abdomen 05/30/2014  . Dizziness 02/08/2014  . Macrocytic anemia 08/08/2013  . CAD (coronary artery disease)   . Dyspnea 08/07/2013  . AAA (abdominal aortic aneurysm) without rupture ([place]) 05/02/2013  . Obesity (BMI 30-39.9) 03/21/2013  . Family history of malignant neoplasm  of gastrointestinal tract 08/25/2011  . 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 Fatigue 03/24/2011  . DOE (dyspnea on exertion) 03/12/2011  . Warfarin anticoagulation 03/11/2011  . INSOMNIA-SLEEP DISORDER-UNSPEC 09/12/2009  . CERVICAL RADICULOPATHY, RIGHT 07/24/2009  . DEGENERATIVE JOINT DISEASE, ADVANCED 05/21/2009  . AVASCULAR NECROSIS 05/21/2009  . ALLERGIC RHINITIS CAUSE UNSPECIFIED 11/01/2008  . PULMONARY NODULE, RIGHT LOWER LOBE 10/25/2008  . DIVERTICULOSIS, COLON 10/07/2008  . Hypertrophic obstructive cardiomyopathy ([place]) 10/05/2008  . PULMONARY EMBOLISM, HX OF 10/05/2008  . EDEMA- LOCALIZED 05/07/2008  . ASTHMA 02/20/2008  . OTHER AND UNSPECIFIED HYPERLIPIDEMIA 09/08/2007  . HYPERPLASIA PROSTATE UNS W/UR OBST & OTH LUTS 06/28/2007  . PAF (paroxysmal atrial fibrillation) ([place]) 06/25/2007  . NOCTURIA 05/12/2007  . COLONIC POLYPS 04/01/2006  . GOUT 04/01/2006  . Essential hypertension 04/01/2006  . ACID REFLUX DISEASE 04/01/2006    Current Outpatient Prescriptions on File Prior to Visit  Medication Sig Dispense Refill  . amLODipine (NORVASC) 5 MG tablet Take 1 tablet (5 mg total) by mouth daily. 90 tablet 3  . aspirin 81 MG chewable tablet Chew 1 tablet by mouth daily.    [name] atorvastatin (LIPITOR) 10 MG tablet TAKE 1 TABLET EVERY EVENING 90 tablet 3  . nitroGLYCERIN (NITROSTAT) 0.4 MG SL tablet Place 1 tablet (0.4  mg total) under the tongue every 5 (five) minutes as needed for chest pain (up to 3 doses). 25 tablet 4  . pantoprazole (PROTONIX) 40 MG tablet TAKE 1 TABLET DAILY 90 tablet 1  . sertraline (ZOLOFT) 50 MG tablet Take 1 tablet (50 mg total) by mouth daily. --- Office visit needed for further refills 90 tablet 0  . traZODone (DESYREL) 100 MG tablet Take 1 tablet (100 mg total) by mouth at bedtime. 30 tablet 2  . vitamin B-12 (CYANOCOBALAMIN) 1000 MCG tablet Take 1,000 mcg by mouth daily.    [name] warfarin (COUMADIN) 3 MG tablet TAKE ONE AND ONE-HALF TO TWO TABLETS DAILY  AS DIRECTED BY COUMADIN CLINIC 180 tablet 1   No current facility-administered medications on file prior to visit.     Past Medical History:  Diagnosis Date  . Arthritis   . Asthma   . BPH (benign prostatic hypertrophy)   . CAD (coronary artery disease)    a. 07/2013: s/p DES to LAD, normal LVF.  [name] Complication of anesthesia    "I got all kinds of hallucinations"  . COPD (chronic obstructive pulmonary disease) ([place])    a. 07/2013 PFT's mild airflow obstruction, no restriction, sev decrease in DLCO.  . Diverticulosis   . DVT (deep venous thrombosis) ([place])    a. 2010 Lower ext s/p back surgery.  [name] Dyspnea on exertion    a. 07/2013 PFT's mild airflow obstr   . GERD (gastroesophageal reflux disease)   . History of gout   . History of hiatal hernia   . Hx of echocardiogram 2015   Echo (06/2013): EF 60-65% normal wall motion, normal diastolic function, aortic sclerosis without stenosis, Trivial MR, mild SAM due to long, redundant mitral leaflets, mild RAE, normal RVSF  . Hypertension   . Macrocytic anemia    a. 07/2013: documented on prior labs.  . Obesity   . OSA (obstructive sleep apnea) 11/14/2015  . PAF (paroxysmal atrial fibrillation) ([place])    a. Flecainide discontinued 07/2013 in setting of CAD.; b. s/p PVI Ablation at [facility] (Dr Joseph Berkshire) 11/2014  . Pneumonia ~ 2010 X 1  . Pulmonary embolism ([place])    a. 2010 in setting of DVT post-op back surgery. b. Low prob VQ 07/2013.  [name] Spinal stenosis    Congential  . Transaminitis    a. 07/2013: mild.  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 ANTERIOR LUMBAR [place] ARTHROPLASTY  03/2008   "spacer poped out; had to repair"  . CATARACT EXTRACTION W/ INTRAOCULAR LENS  IMPLANT, BILATERAL Bilateral   . COLONOSCOPY W/ POLYPECTOMY  2004  . Colonoscopy with polypectomy  09/2011   2 tubular adenomas  . CORONARY ANGIOPLASTY WITH STENT PLACEMENT  08/08/2013   "1"  . JOINT REPLACEMENT    .  LEFT AND RIGHT HEART CATHETERIZATION WITH CORONARY ANGIOGRAM N/A 08/07/2013   Procedure: LEFT AND RIGHT HEART CATHETERIZATION WITH CORONARY ANGIOGRAM;  Surgeon: Peter M Martinique, MD;  Location: [facility] CATH LAB;  Service: Cardiovascular;  Laterality: N/A;  . LUMBAR FUSION  02/2008  . PERCUTANEOUS CORONARY STENT INTERVENTION (PCI-S)  08/07/2013   Procedure: PERCUTANEOUS CORONARY STENT INTERVENTION (PCI-S);  Surgeon: Peter M Martinique, MD;  Location: [facility] CATH LAB;  Service: Cardiovascular;;  . PVI ablation  11/2014   Dr. Venita Sheffield [facility]  . TOTAL HIP ARTHROPLASTY Bilateral   . VASECTOMY      Social History   Social History  .  Marital status: Married    Spouse name: N/A  . Number of children: 4  . Years of education: N/A   Occupational History  . Retired from [employer] Retired   Social History Main Topics  . Smoking status: Former Smoker    Packs/day: 2.00    Years: 40.00    Types: Cigarettes    Quit date: 03/09/2000  . Smokeless tobacco: Never Used     Comment: smoked 1964-2002, up to 3 ppd  . Alcohol use 0.0 oz/week     Comment: 1-2 beers every 2 weeks (10/30/15) /// 02/08/2014 "drink a couple times/month"  . Drug use: No  . Sexual activity: Not on file     Comment: "sexually hx is none of your business" (02/08/2014)   Other Topics Concern  . Not on file   Social History Narrative  . No narrative on file    Family History  Problem Relation Age of Onset  . Heart attack Paternal Grandfather        Over 18  . Atrial fibrillation Mother   . COPD Mother   . Heart disease Father   . Colon cancer Father   . Heart attack Brother 37  . Alcohol abuse Brother 26       Died post liver transplant  . Heart attack Paternal Uncle        Less than 72  . Stroke Neg Hx     Review of Systems  Constitutional: Negative for chills and fever.  Musculoskeletal: Positive for arthralgias, back pain (chronic, no change) and myalgias.  Skin: Negative for color change, rash and wound.    Neurological: Positive for numbness. Negative for weakness.       Objective:   Vitals:   10/06/16 1453  BP: 138/78  Pulse: 78  Resp: 18  Temp: 98 F (36.7 C)   Filed Weights   10/06/16 1453  Weight: 274 lb (124.3 kg)   Body mass index is 35.18 kg/m.  Wt Readings from Last 3 Encounters:  10/06/16 274 lb (124.3 kg)  09/08/16 269 lb (122 kg)  07/21/16 268 lb (121.6 kg)     Physical Exam  Constitutional: He appears well-developed and well-nourished. No distress.  HENT:  Head: Normocephalic and atraumatic.  Musculoskeletal:  Tenderness with palpation along left iliac crest with increased pain with walking and movement. No pain at rest.  No left lower back tenderness with palpation  Skin: Skin is warm and dry. No rash noted. He is not diaphoretic. No erythema.          Assessment & Plan:   See Problem List for Assessment and Plan of chronic medical problems.

## 2016-10-06 NOTE — Patient Instructions (Addendum)
  Try icing the area.  Take the prednisone taper as prescribed.  This was sent to your pharmacy.    Make an appointment with Dr Tamala Julian our sports medicine doctor.   Try stopping the sertraline.

## 2016-10-08 ENCOUNTER — Institutional Professional Consult (permissible substitution): Payer: Medicare Other | Admitting: Pulmonary Disease

## 2016-10-10 ENCOUNTER — Other Ambulatory Visit: Payer: Self-pay | Admitting: Cardiology

## 2016-10-12 NOTE — Telephone Encounter (Signed)
REFILL 

## 2016-10-13 ENCOUNTER — Ambulatory Visit (INDEPENDENT_AMBULATORY_CARE_PROVIDER_SITE_OTHER): Payer: Medicare Other | Admitting: Pharmacist

## 2016-10-13 DIAGNOSIS — I4891 Unspecified atrial fibrillation: Secondary | ICD-10-CM | POA: Diagnosis not present

## 2016-10-13 DIAGNOSIS — I480 Paroxysmal atrial fibrillation: Secondary | ICD-10-CM

## 2016-10-13 DIAGNOSIS — Z86718 Personal history of other venous thrombosis and embolism: Secondary | ICD-10-CM

## 2016-10-13 DIAGNOSIS — I2510 Atherosclerotic heart disease of native coronary artery without angina pectoris: Secondary | ICD-10-CM | POA: Diagnosis not present

## 2016-10-13 LAB — POCT INR: INR: 2.9

## 2016-10-14 DIAGNOSIS — M1712 Unilateral primary osteoarthritis, left knee: Secondary | ICD-10-CM | POA: Diagnosis not present

## 2016-10-14 DIAGNOSIS — M238X2 Other internal derangements of left knee: Secondary | ICD-10-CM | POA: Diagnosis not present

## 2016-10-14 DIAGNOSIS — M545 Low back pain: Secondary | ICD-10-CM | POA: Diagnosis not present

## 2016-10-19 ENCOUNTER — Other Ambulatory Visit: Payer: Self-pay | Admitting: Pulmonary Disease

## 2016-10-31 NOTE — Progress Notes (Signed)
[name] [address] [place], [place] [address] Phone: [phone] Subjective:    I'm seeing this patient by the request  of:  Binnie Rail, MD   CC: hip pain  [id number]  [name] [name] is a 71 y.o. male coming in with complaint of hip pain.patient's on primary care provider 3 weeks ago and was describing pain across his left iliac crest.patient did go see an orthopedic and did have x-rays that showed that there is no significant arthritic changes. Patient states that he was given a steroid injection as well as given Vicodin and muscle relaxer. States that the injection helped a little bit. States that there is pain with walking and has gotten worse. Points to most of the pain on the lateral aspect but can radiate to his groin.      Past Medical History:  Diagnosis Date  . Arthritis   . Asthma   . BPH (benign prostatic hypertrophy)   . CAD (coronary artery disease)    a. 07/2013: s/p DES to LAD, normal LVF.  [name] Complication of anesthesia    "I got all kinds of hallucinations"  . COPD (chronic obstructive pulmonary disease) ([place])    a. 07/2013 PFT's mild airflow obstruction, no restriction, sev decrease in DLCO.  . Diverticulosis   . DVT (deep venous thrombosis) ([place])    a. 2010 Lower ext s/p back surgery.  [name] Dyspnea on exertion    a. 07/2013 PFT's mild airflow obstr   . GERD (gastroesophageal reflux disease)   . History of gout   . History of hiatal hernia   . Hx of echocardiogram 2015   Echo (06/2013): EF 60-65% normal wall motion, normal diastolic function, aortic sclerosis without stenosis, Trivial MR, mild SAM due to long, redundant mitral leaflets, mild RAE, normal RVSF  . Hypertension   . Macrocytic anemia    a. 07/2013: documented on prior labs.  . Obesity   . OSA (obstructive sleep apnea) 11/14/2015  . PAF (paroxysmal atrial fibrillation) ([place])    a. Flecainide discontinued 07/2013 in setting of CAD.; b. s/p PVI Ablation at  [facility] (Dr Joseph Berkshire) 11/2014  . Pneumonia ~ 2010 X 1  . Pulmonary embolism ([place])    a. 2010 in setting of DVT post-op back surgery. b. Low prob VQ 07/2013.  [name] Spinal stenosis    Congential  . Transaminitis    a. 07/2013: mild.  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 ANTERIOR LUMBAR [place] ARTHROPLASTY  03/2008   "spacer poped out; had to repair"  . CATARACT EXTRACTION W/ INTRAOCULAR LENS  IMPLANT, BILATERAL Bilateral   . COLONOSCOPY W/ POLYPECTOMY  2004  . Colonoscopy with polypectomy  09/2011   2 tubular adenomas  . CORONARY ANGIOPLASTY WITH STENT PLACEMENT  08/08/2013   "1"  . JOINT REPLACEMENT    . LEFT AND RIGHT HEART CATHETERIZATION WITH CORONARY ANGIOGRAM N/A 08/07/2013   Procedure: LEFT AND RIGHT HEART CATHETERIZATION WITH CORONARY ANGIOGRAM;  Surgeon: Peter M Martinique, MD;  Location: [facility] CATH LAB;  Service: Cardiovascular;  Laterality: N/A;  . LUMBAR FUSION  02/2008  . PERCUTANEOUS CORONARY STENT INTERVENTION (PCI-S)  08/07/2013   Procedure: PERCUTANEOUS CORONARY STENT INTERVENTION (PCI-S);  Surgeon: Peter M Martinique, MD;  Location: [facility] CATH LAB;  Service: Cardiovascular;;  . PVI ablation  11/2014   Dr. Venita Sheffield [facility]  . TOTAL HIP ARTHROPLASTY Bilateral   . VASECTOMY  Social History   Social History  . Marital status: Married    Spouse name: N/A  . Number of children: 4  . Years of education: N/A   Occupational History  . Retired from [employer] Retired   Social History Main Topics  . Smoking status: Former Smoker    Packs/day: 2.00    Years: 40.00    Types: Cigarettes    Quit date: 03/09/2000  . Smokeless tobacco: Never Used     Comment: smoked 1964-2002, up to 3 ppd  . Alcohol use 0.0 oz/week     Comment: 1-2 beers every 2 weeks (10/30/15) /// 02/08/2014 "drink a couple times/month"  . Drug use: No  . Sexual activity: Not Asked     Comment: "sexually hx is none of your business" (02/08/2014)   Other  Topics Concern  . None   Social History Narrative  . None   Allergies  Allergen Reactions  . Penicillins Anaphylaxis     Because of a history of documented adverse serious drug reaction;Medi Alert bracelet  is recommended   Family History  Problem Relation Age of Onset  . Heart attack Paternal Grandfather        Over 50  . Atrial fibrillation Mother   . COPD Mother   . Heart disease Father   . Colon cancer Father   . Heart attack Brother 25  . Alcohol abuse Brother 65       Died post liver transplant  . Heart attack Paternal Uncle        Less than 68  . Stroke Neg Hx      Past medical history, social, surgical and family history all reviewed in electronic medical record.  No pertanent information unless stated regarding to the chief complaint.   Review of Systems:Review of systems updated and as accurate as of 11/02/16  No headache, visual changes, nausea, vomiting, diarrhea, constipation, dizziness, abdominal pain, skin rash, fevers, chills, night sweats, weight loss, swollen lymph nodes, body aches, joint swelling, m chest pain, shortness of breath, mood changes. Positive muscle aches  Objective  Blood pressure 120/72, pulse 89, height 6\' 2"  (1.88 m), weight 274 lb (124.3 kg), SpO2 94 %. Systems examined below as of 11/02/16   General: No apparent distress alert and oriented x3 mood and affect normal, dressed appropriately.  HEENT: Pupils equal, extraocular movements intact  Respiratory: Patient's speak in full sentences and does not appear short of breath  Cardiovascular: No lower extremity edema, non tender, no erythema  Skin: Warm dry intact with no signs of infection or rash on extremities or on axial skeleton.  Abdomen: Soft nontender  Neuro: Cranial nerves II through XII are intact, neurovascularly intact in all extremities with 2+ DTRs and 2+ pulses.  Lymph: No lymphadenopathy of posterior or anterior cervical chain or axillae bilaterally.  Gait normal with good  balance and coordination.  MSK:  Non tender with full range of motion and good stability and symmetric strength and tone of shoulders, elbows, wrist,  knee and ankles bilaterally.  GUY:QIHK ROM Mild limitation in internal range of motion but no true pain. Patient is tender to palpation over the flank on the left side especially where patient did have scarring from his previous surgeries. Scar tissue or keloid formation underneath the skin appreciated. Strength IR: 5/5, ER: 5/5, Flexion: 5/5, Extension: 5/5, Abduction: 5/5, Adduction: 5/5 Pelvic alignment unremarkable to inspection and palpation. Standing hip rotation and gait without trendelenburg sign / unsteadiness. Greater trochanter without tenderness to  palpation. No tenderness over piriformis and greater trochanter. Tightness with [place]y Sox No SI joint tenderness and normal minimal SI movement.     Impression and Recommendations:     This case required medical decision making of moderate complexity.      Note: This dictation was prepared with Dragon dictation along with smaller phrase technology. Any transcriptional errors that result from this process are unintentional.

## 2016-11-02 ENCOUNTER — Encounter: Payer: Self-pay | Admitting: Family Medicine

## 2016-11-02 ENCOUNTER — Ambulatory Visit (INDEPENDENT_AMBULATORY_CARE_PROVIDER_SITE_OTHER): Payer: Medicare Other | Admitting: Family Medicine

## 2016-11-02 DIAGNOSIS — I2510 Atherosclerotic heart disease of native coronary artery without angina pectoris: Secondary | ICD-10-CM | POA: Diagnosis not present

## 2016-11-02 DIAGNOSIS — L905 Scar conditions and fibrosis of skin: Secondary | ICD-10-CM

## 2016-11-02 MED ORDER — GABAPENTIN 100 MG PO CAPS: 200.0000 mg | ORAL_CAPSULE | Freq: Every day | ORAL | 3 refills | Status: DC

## 2016-11-02 NOTE — Patient Instructions (Signed)
Good to see you I think you may have some scar tissue pushing on a nerve.  Ice is your friend  pennsaid pinkie amount topically 2 times daily as needed.  We will need to call you though when we have some.  Gabapentin 200 mg at night will help calm down the nerve.  Hand massager in the area 5-10 minutes 1-2 times a dya could help  Continue the vitamin D See me again in

## 2016-11-02 NOTE — Assessment & Plan Note (Signed)
I believe the patient's pain is significantly to more of a scar tissue formation. Discussed with patient about the possibility of massage in the area, topical anti-inflammatories, icing regimen. Patient will monitor. Given a very short course of gabapentin. I'm hoping that this will be beneficial. Necessary history of spinal stenosis and this could be more of a nerve entrapment anyhow. I do not think that further imaging is necessary at this time but if continuing have pain we will consider epidural. I do not think will be necessary. Follow-up again in 3-4 weeks. Can also do peripheral nerve root injections if needed.

## 2016-11-13 DIAGNOSIS — M722 Plantar fascial fibromatosis: Secondary | ICD-10-CM | POA: Diagnosis not present

## 2016-11-19 ENCOUNTER — Other Ambulatory Visit: Payer: Self-pay | Admitting: Cardiology

## 2016-11-26 ENCOUNTER — Institutional Professional Consult (permissible substitution): Payer: Medicare Other | Admitting: Pulmonary Disease

## 2016-12-04 ENCOUNTER — Encounter: Payer: Self-pay | Admitting: Family Medicine

## 2016-12-07 MED ORDER — GABAPENTIN 300 MG PO CAPS: 300.0000 mg | ORAL_CAPSULE | Freq: Every day | ORAL | 1 refills | Status: DC

## 2016-12-08 ENCOUNTER — Ambulatory Visit (INDEPENDENT_AMBULATORY_CARE_PROVIDER_SITE_OTHER): Payer: Medicare Other | Admitting: Pharmacist Clinician (PhC)/ Clinical Pharmacy Specialist

## 2016-12-08 DIAGNOSIS — I480 Paroxysmal atrial fibrillation: Secondary | ICD-10-CM

## 2016-12-08 DIAGNOSIS — Z7901 Long term (current) use of anticoagulants: Secondary | ICD-10-CM

## 2016-12-08 DIAGNOSIS — I4891 Unspecified atrial fibrillation: Secondary | ICD-10-CM | POA: Diagnosis not present

## 2016-12-08 DIAGNOSIS — M722 Plantar fascial fibromatosis: Secondary | ICD-10-CM | POA: Diagnosis not present

## 2016-12-08 DIAGNOSIS — Z86718 Personal history of other venous thrombosis and embolism: Secondary | ICD-10-CM

## 2016-12-08 LAB — POCT INR: INR: 1.9

## 2016-12-14 ENCOUNTER — Ambulatory Visit (INDEPENDENT_AMBULATORY_CARE_PROVIDER_SITE_OTHER): Payer: Medicare Other

## 2016-12-14 DIAGNOSIS — Z23 Encounter for immunization: Secondary | ICD-10-CM

## 2016-12-16 DIAGNOSIS — M722 Plantar fascial fibromatosis: Secondary | ICD-10-CM | POA: Diagnosis not present

## 2016-12-23 ENCOUNTER — Other Ambulatory Visit: Payer: Self-pay | Admitting: Cardiology

## 2016-12-23 ENCOUNTER — Other Ambulatory Visit: Payer: Self-pay | Admitting: Internal Medicine

## 2016-12-23 DIAGNOSIS — M722 Plantar fascial fibromatosis: Secondary | ICD-10-CM | POA: Diagnosis not present

## 2016-12-23 NOTE — Telephone Encounter (Signed)
REFILL 

## 2016-12-24 ENCOUNTER — Telehealth: Payer: Self-pay | Admitting: Emergency Medicine

## 2016-12-24 NOTE — Telephone Encounter (Signed)
Needs f/u with me and wellness with Sharee Pimple.  If he is willing we can refer him to a neuropsychologist to evaluate his memory.

## 2016-12-24 NOTE — Telephone Encounter (Addendum)
Pts wife is concerned about pts memory and that his family is also noticing it. Is this something you think pt needs to be seen for? Pt is also due for an annual visit

## 2016-12-24 NOTE — Progress Notes (Signed)
Subjective:    Patient ID: [name] [name], male    DOB: June 29, 1945, 71 y.o.   MRN: [id number]  HPI He is here for an acute visit.  He is here because his wife wants him to be here.   He has had difficulty moving about recetnly.  He has plantar fasciitis.  He is seeing a podiatrist.  He is also seeing Dr Tamala Julian for left leg pain.  He is taking gabapentin and it helps.  He has back pain - chronic.  He had back surgery twice.   Last night his wife found out he was having back pain.  About 6 months ago he had an injection and he thinks that helped - he is going to call the specialist for another injection.   He has swelling in his ankles.  He denies pain.  This started 4-6 weeks ago. He is not active.  He is not compliant with a low sodium diet.  He thinks he has been eating more salt in the past few weeks.   Body hurts all over: he has the plantar fasciitis.  His back hurts.  His left hip hurts and he is seeing Dr Tamala Julian.    Family has concerns regarding his memory:  When asked he states he has memory issues related to aging only, but does not feel it is anything more.  He is not concerned about his memory.    He has an odd pain in his left leg just when he is laying down.  It feels like a squeezing sensation that starts in the foot, moves up to the calf and sometimes the thigh.  It can wake him up.  It only occurs when he is laying down.  It started one month ago.    COPD:  He has DOE and will see pulmonary soon.  He does not think inhalers have helped in the past.    OSA - using cpap.  He wakes up early - 3:45 am 2-3 times a week and can not fall back asleep.  He sometimes takes a 20 min nap.  More difficulty staying asleep, sometimes getting asleep.  He takes trazodone but does not think it works.   Prediabetes:  He is compliant with a low sugar/carbohydrate diet.  He is not exercising regularly.  CAD, Hypertension: He is taking his medication daily. He is not compliant with a low sodium  diet.  He denies chest pain, palpitations, and regular headaches. He is not exercising regularly.  He does not monitor his blood pressure at home.    Hyperlipidemia: He is taking his medication daily. He is compliant with a low fat/cholesterol diet. He is not exercising regularly.    Medications and allergies reviewed with patient and updated if appropriate.  Patient Active Problem List   Diagnosis Date Noted  . Scar tissue 11/02/2016  . Plantar fasciitis 10/06/2016  . 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 Vitamin D deficiency 07/21/2016  . OSA (obstructive sleep apnea) 11/14/2015  . Pneumonia 10/24/2015  . Prediabetes 04/17/2015  . COPD GOLD II 08/21/2014  . Anemia 08/20/2014  . Hyperlipidemia 08/20/2014  . Spinal stenosis of lumbar region 05/30/2014  . Abnormal CT of the abdomen 05/30/2014  . Dizziness 02/08/2014  . Macrocytic anemia 08/08/2013  . CAD (coronary artery disease)   . AAA (abdominal aortic aneurysm) without rupture ([place]) 05/02/2013  . Obesity (BMI 30-39.9) 03/21/2013  . Family history of malignant neoplasm of gastrointestinal tract 08/25/2011  . 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 Fatigue 03/24/2011  . DOE (dyspnea on exertion) 03/12/2011  . Warfarin anticoagulation 03/11/2011  . INSOMNIA-SLEEP DISORDER-UNSPEC 09/12/2009  . CERVICAL RADICULOPATHY, RIGHT 07/24/2009  . DEGENERATIVE JOINT DISEASE, ADVANCED 05/21/2009  . AVASCULAR NECROSIS 05/21/2009  . ALLERGIC RHINITIS CAUSE UNSPECIFIED 11/01/2008  . PULMONARY NODULE, RIGHT LOWER LOBE 10/25/2008  . DIVERTICULOSIS, COLON 10/07/2008  . 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 PULMONARY EMBOLISM, HX OF 10/05/2008  . EDEMA- LOCALIZED 05/07/2008  . ASTHMA 02/20/2008  . OTHER AND UNSPECIFIED HYPERLIPIDEMIA 09/08/2007  . HYPERPLASIA PROSTATE UNS W/UR OBST & OTH LUTS 06/28/2007  . PAF (paroxysmal atrial fibrillation) ([place]) 06/25/2007  . NOCTURIA 05/12/2007  . COLONIC POLYPS 04/01/2006  . GOUT  04/01/2006  . Essential hypertension 04/01/2006  . ACID REFLUX DISEASE 04/01/2006    Current Outpatient Prescriptions on File Prior to Visit  Medication Sig Dispense Refill  . amLODipine (NORVASC) 5 MG tablet Take 1 tablet (5 mg total) by mouth daily. -- Office visit needed for further refills 90 tablet 0  . aspirin 81 MG chewable tablet Chew 1 tablet by mouth daily.    [name] atorvastatin (LIPITOR) 10 MG tablet TAKE 1 TABLET EVERY EVENING 90 tablet 3  . gabapentin (NEURONTIN) 300 MG capsule Take 1 capsule (300 mg total) by mouth at bedtime. 30 capsule 1  . methocarbamol (ROBAXIN) 500 MG tablet     . nitroGLYCERIN (NITROSTAT) 0.4 MG SL tablet Place 1 tablet (0.4 mg total) under the tongue every 5 (five) minutes as needed for chest pain (up to 3 doses). 25 tablet 4  . pantoprazole (PROTONIX) 40 MG tablet TAKE 1 TABLET DAILY 90 tablet 3  . traZODone (DESYREL) 100 MG tablet TAKE 1 TABLET BY MOUTH AT BEDTIME 30 tablet 2  . vitamin B-12 (CYANOCOBALAMIN) 1000 MCG tablet Take 1,000 mcg by mouth daily.    . Vitamin D, Ergocalciferol, (DRISDOL) 50000 units CAPS capsule     . warfarin (COUMADIN) 3 MG tablet TAKE ONE AND ONE-HALF TO TWO TABLETS DAILY AS DIRECTED BY COUMADIN CLINIC 180 tablet 1  . HYDROcodone-acetaminophen (NORCO/VICODIN) 5-325 MG tablet      No current facility-administered medications on file prior to visit.     Past Medical History:  Diagnosis Date  . Arthritis   . Asthma   . BPH (benign prostatic hypertrophy)   . CAD (coronary artery disease)    a. 07/2013: s/p DES to LAD, normal LVF.  [name] Complication of anesthesia    "I got all kinds of hallucinations"  . COPD (chronic obstructive pulmonary disease) ([place])    a. 07/2013 PFT's mild airflow obstruction, no restriction, sev decrease in DLCO.  . Diverticulosis   . DVT (deep venous thrombosis) ([place])    a. 2010 Lower ext s/p back surgery.  [name] Dyspnea on exertion    a. 07/2013 PFT's mild airflow obstr   . GERD (gastroesophageal reflux  disease)   . History of gout   . History of hiatal hernia   . Hx of echocardiogram 2015   Echo (06/2013): EF 60-65% normal wall motion, normal diastolic function, aortic sclerosis without stenosis, Trivial MR, mild SAM due to long, redundant mitral leaflets, mild RAE, normal RVSF  . Hypertension   . Macrocytic anemia    a. 07/2013: documented on prior labs.  . Obesity   . OSA (obstructive sleep apnea) 11/14/2015  . PAF (paroxysmal atrial fibrillation) ([place])    a. Flecainide discontinued 07/2013 in setting of CAD.; b. s/p PVI Ablation at [facility] (  Dr Joseph Berkshire) 11/2014  . Pneumonia ~ 2010 X 1  . Pulmonary embolism ([place])    a. 2010 in setting of DVT post-op back surgery. b. Low prob VQ 07/2013.  [name] Spinal stenosis    Cong[place]  . Transaminitis    a. 07/2013: mild.  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 ANTERIOR LUMBAR [place] ARTHROPLASTY  03/2008   "spacer poped out; had to repair"  . CATARACT EXTRACTION W/ INTRAOCULAR LENS  IMPLANT, BILATERAL Bilateral   . COLONOSCOPY W/ POLYPECTOMY  2004  . Colonoscopy with polypectomy  09/2011   2 tubular adenomas  . CORONARY ANGIOPLASTY WITH STENT PLACEMENT  08/08/2013   "1"  . JOINT REPLACEMENT    . LEFT AND RIGHT HEART CATHETERIZATION WITH CORONARY ANGIOGRAM N/A 08/07/2013   Procedure: LEFT AND RIGHT HEART CATHETERIZATION WITH CORONARY ANGIOGRAM;  Surgeon: Peter M Martinique, MD;  Location: [facility] CATH LAB;  Service: Cardiovascular;  Laterality: N/A;  . LUMBAR FUSION  02/2008  . PERCUTANEOUS CORONARY STENT INTERVENTION (PCI-S)  08/07/2013   Procedure: PERCUTANEOUS CORONARY STENT INTERVENTION (PCI-S);  Surgeon: Peter M Martinique, MD;  Location: [facility] CATH LAB;  Service: Cardiovascular;;  . PVI ablation  11/2014   Dr. Venita [facility]  . TOTAL HIP ARTHROPLASTY Bilateral   . VASECTOMY      Social History   Social History  . Marital status: Married    Spouse name: N/A  . Number of children:  4  . Years of education: N/A   Occupational History  . Retired from [employer] Retired   Social History Main Topics  . Smoking status: Former Smoker    Packs/day: 2.00    Years: 40.00    Types: Cigarettes    Quit date: 03/09/2000  . Smokeless tobacco: Never Used     Comment: smoked 1964-2002, up to 3 ppd  . Alcohol use 0.0 oz/week     Comment: 1-2 beers every 2 weeks (10/30/15) /// 02/08/2014 "drink a couple times/month"  . Drug use: No  . Sexual activity: Not Asked     Comment: "sexually hx is none of your business" (02/08/2014)   Other Topics Concern  . None   Social History Narrative  . None    Family History  Problem Relation Age of Onset  . Heart attack Paternal Grandfather        Over 69  . Atrial fibrillation Mother   . COPD Mother   . Heart disease Father   . Colon cancer Father   . Heart attack Brother 81  . Alcohol abuse Brother 59       Died post liver transplant  . Heart attack Paternal Uncle        Less than 25  . Stroke Neg Hx     Review of Systems  Constitutional: Negative for chills and fever.  Respiratory: Positive for cough (dry), shortness of breath (chronic) and wheezing.   Cardiovascular: Positive for leg swelling. Negative for chest pain and palpitations.  Musculoskeletal: Positive for back pain.  Neurological: Positive for weakness (left leg) and numbness (left leg/foot). Negative for light-headedness and headaches.       Balance feels off - no falls  Psychiatric/Behavioral: Positive for sleep disturbance. Negative for dysphoric mood. The patient is nervous/anxious.        No memory concerns except normal aging       Objective:   Vitals:   12/25/16 1049  BP: 112/72  Pulse:  65  Resp: 18  Temp: 97.8 F (36.6 C)  SpO2: 95%   Filed Weights   12/25/16 1049  Weight: 285 lb (129.3 kg)   Body mass index is 36.59 kg/m.  Wt Readings from Last 3 Encounters:  12/25/16 285 lb (129.3 kg)  11/02/16 274 lb (124.3 kg)  10/06/16 274 lb (124.3 kg)       Physical Exam Constitutional: Appears well-developed and well-nourished. No distress.  HENT:  Head: Normocephalic and atraumatic.  Neck: Neck supple. No tracheal deviation present. No thyromegaly present.  No cervical lymphadenopathy Cardiovascular: Normal rate, regular rhythm and normal heart sounds.   No murmur heard. No carotid bruit .  1+ b/l LE edema Pulmonary/Chest: Effort normal and breath sounds normal. No respiratory distress. No has no wheezes. No rales.  Skin: Skin is warm and dry. Not diaphoretic.  Psychiatric: Normal mood and affect. Behavior is normal.         Assessment & Plan:   See Problem List for Assessment and Plan of chronic medical problems.

## 2016-12-25 ENCOUNTER — Telehealth: Payer: Self-pay | Admitting: Internal Medicine

## 2016-12-25 ENCOUNTER — Ambulatory Visit (INDEPENDENT_AMBULATORY_CARE_PROVIDER_SITE_OTHER): Payer: Medicare Other | Admitting: Internal Medicine

## 2016-12-25 ENCOUNTER — Encounter: Payer: Self-pay | Admitting: Internal Medicine

## 2016-12-25 ENCOUNTER — Other Ambulatory Visit (INDEPENDENT_AMBULATORY_CARE_PROVIDER_SITE_OTHER): Payer: Medicare Other

## 2016-12-25 VITALS — BP 112/72 | HR 65 | Temp 97.8°F | Resp 18 | Wt 285.0 lb

## 2016-12-25 DIAGNOSIS — E559 Vitamin D deficiency, unspecified: Secondary | ICD-10-CM

## 2016-12-25 DIAGNOSIS — I10 Essential (primary) hypertension: Secondary | ICD-10-CM | POA: Diagnosis not present

## 2016-12-25 DIAGNOSIS — R7303 Prediabetes: Secondary | ICD-10-CM

## 2016-12-25 DIAGNOSIS — R600 Localized edema: Secondary | ICD-10-CM | POA: Diagnosis not present

## 2016-12-25 DIAGNOSIS — I2510 Atherosclerotic heart disease of native coronary artery without angina pectoris: Secondary | ICD-10-CM | POA: Diagnosis not present

## 2016-12-25 DIAGNOSIS — F419 Anxiety disorder, unspecified: Secondary | ICD-10-CM | POA: Diagnosis not present

## 2016-12-25 DIAGNOSIS — G4709 Other insomnia: Secondary | ICD-10-CM | POA: Diagnosis not present

## 2016-12-25 DIAGNOSIS — Z1159 Encounter for screening for other viral diseases: Secondary | ICD-10-CM

## 2016-12-25 LAB — COMPREHENSIVE METABOLIC PANEL
ALBUMIN: 4.3 g/dL (ref 3.5–5.2)
ALK PHOS: 70 U/L (ref 39–117)
ALT: 26 U/L (ref 0–53)
AST: 26 U/L (ref 0–37)
BILIRUBIN TOTAL: 0.4 mg/dL (ref 0.2–1.2)
BUN: 12 mg/dL (ref 6–23)
CO2: 26 mEq/L (ref 19–32)
Calcium: 9.4 mg/dL (ref 8.4–10.5)
Chloride: 103 mEq/L (ref 96–112)
Creatinine, Ser: 0.9 mg/dL (ref 0.40–1.50)
GFR: 87.97 mL/min (ref >60.00)
Glucose, Bld: 98 mg/dL (ref 70–99)
POTASSIUM: 4.1 meq/L (ref 3.5–5.1)
Sodium: 139 mEq/L (ref 135–145)
TOTAL PROTEIN: 7.6 g/dL (ref 6.0–8.3)

## 2016-12-25 LAB — CBC WITH DIFFERENTIAL/PLATELET
BASOS ABS: 0.1 10*3/uL (ref 0.0–0.1)
Basophils Relative: 1.1 % (ref 0.0–3.0)
EOS PCT: 8.3 % — AB (ref 0.0–5.0)
Eosinophils Absolute: 0.8 10*3/uL — ABNORMAL HIGH (ref 0.0–0.7)
HCT: 34.5 % — ABNORMAL LOW (ref 39.0–52.0)
HEMOGLOBIN: 11.4 g/dL — AB (ref 13.0–17.0)
Lymphocytes Relative: 25.1 % (ref 12.0–46.0)
Lymphs Abs: 2.5 10*3/uL (ref 0.7–4.0)
MCHC: 33 g/dL (ref 30.0–36.0)
MCV: 105.8 fl — ABNORMAL HIGH (ref 78.0–100.0)
MONO ABS: 0.9 10*3/uL (ref 0.1–1.0)
MONOS PCT: 9.5 % (ref 3.0–12.0)
NEUTROS PCT: 56 % (ref 43.0–77.0)
Neutro Abs: 5.5 10*3/uL (ref 1.4–7.7)
Platelets: 487 10*3/uL — ABNORMAL HIGH (ref 150.0–400.0)
RBC: 3.26 Mil/uL — AB (ref 4.22–5.81)
RDW: 16.8 % — ABNORMAL HIGH (ref 11.5–15.5)
WBC: 9.9 10*3/uL (ref 4.0–10.5)

## 2016-12-25 LAB — LIPID PANEL
Cholesterol: 97 mg/dL (ref 0–200)
HDL: 40.1 mg/dL (ref >39.00)
LDL Cholesterol: 37 mg/dL (ref 0–99)
NonHDL: 56.98
TRIGLYCERIDES: 98 mg/dL (ref 0.0–149.0)
Total CHOL/HDL Ratio: 2
VLDL: 19.6 mg/dL (ref 0.0–40.0)

## 2016-12-25 LAB — HEPATITIS C ANTIBODY
HEP C AB: NONREACTIVE
SIGNAL TO CUT-OFF: 0.01 (ref <1.00)

## 2016-12-25 LAB — TSH: TSH: 3.65 u[IU]/mL (ref 0.35–4.50)

## 2016-12-25 LAB — HEMOGLOBIN A1C: HEMOGLOBIN A1C: 6.3 % (ref 4.6–6.5)

## 2016-12-25 LAB — VITAMIN D 25 HYDROXY (VIT D DEFICIENCY, FRACTURES): VITD: 39.1 ng/mL (ref 30.00–100.00)

## 2016-12-25 MED ORDER — METFORMIN HCL 500 MG PO TABS: 500.0000 mg | ORAL_TABLET | Freq: Two times a day (BID) | ORAL | 3 refills | Status: DC

## 2016-12-25 MED ORDER — DULOXETINE HCL 30 MG PO CPEP: 30.0000 mg | ORAL_CAPSULE | Freq: Every day | ORAL | 5 refills | Status: DC

## 2016-12-25 NOTE — Telephone Encounter (Signed)
noted 

## 2016-12-25 NOTE — Patient Instructions (Addendum)
  Test(s) ordered today. Your results will be released to [place] (or called to you) after review, usually within 72hours after test completion. If any changes need to be made, you will be notified at that same time.   Medications reviewed and updated.  Changes include starting cymbalta and metformin.   Your prescription(s) have been submitted to your pharmacy. Please take as directed and contact our office if you believe you are having problem(s) with the medication(s).    Please followup in 2-3 months

## 2016-12-25 NOTE — Telephone Encounter (Signed)
Pt has appt with Dr Quay Burow today at 1030.

## 2016-12-25 NOTE — Telephone Encounter (Signed)
Wife called states that patient has appt this morning.  States that patient can hardly walk.  States that patient keeps saying his whole body hurts and is irritable.  Spouse does not know if this could be internal or orthopedic related.   States that she is also worried about his memory.   Just wanted to let Dr. Quay Burow know.

## 2016-12-26 DIAGNOSIS — R600 Localized edema: Secondary | ICD-10-CM | POA: Insufficient documentation

## 2016-12-26 DIAGNOSIS — F419 Anxiety disorder, unspecified: Secondary | ICD-10-CM | POA: Insufficient documentation

## 2016-12-26 NOTE — Assessment & Plan Note (Signed)
Taking vitamin d - check level

## 2016-12-26 NOTE — Assessment & Plan Note (Signed)
Has cardio f/u in [place] next month No chest pain, but has chronic SOB Continue current medications Encouraged increased exercise

## 2016-12-26 NOTE — Assessment & Plan Note (Signed)
Check a1c Low sugar / carb diet Stressed regular exercise, weight loss  

## 2016-12-26 NOTE — Assessment & Plan Note (Signed)
He has been consuming more sodium which may be the cause - he will decrease the salt intake

## 2016-12-26 NOTE — Assessment & Plan Note (Signed)
Using cpap Trazodone not effective Ativan was effective, but needed high dose, ambien was not effective Discussed concern with memory with long term use of some medications He deferred changes at this time Discussed sleep hygeine

## 2016-12-26 NOTE — Assessment & Plan Note (Signed)
BP Readings from Last 3 Encounters:  12/25/16 112/72  11/02/16 120/72  10/06/16 138/78   BP well controlled Current regimen effective and well tolerated Continue current medications at current doses cmp

## 2016-12-26 NOTE — Assessment & Plan Note (Signed)
Has anxiety Will try cymbalta - hopefully it will help some of his pain as well

## 2016-12-27 ENCOUNTER — Encounter: Payer: Self-pay | Admitting: Internal Medicine

## 2016-12-29 DIAGNOSIS — M722 Plantar fascial fibromatosis: Secondary | ICD-10-CM | POA: Diagnosis not present

## 2016-12-30 ENCOUNTER — Ambulatory Visit (INDEPENDENT_AMBULATORY_CARE_PROVIDER_SITE_OTHER): Payer: Medicare Other | Admitting: Pulmonary Disease

## 2016-12-30 ENCOUNTER — Ambulatory Visit (INDEPENDENT_AMBULATORY_CARE_PROVIDER_SITE_OTHER)
Admission: RE | Admit: 2016-12-30 | Discharge: 2016-12-30 | Disposition: A | Discharge status: Home / Self Care | Payer: Medicare Other | Source: Ambulatory Visit | Attending: Pulmonary Disease | Admitting: Pulmonary Disease

## 2016-12-30 ENCOUNTER — Encounter: Payer: Self-pay | Admitting: Pulmonary Disease

## 2016-12-30 VITALS — BP 122/78 | HR 75 | Ht 74.0 in | Wt 279.0 lb

## 2016-12-30 DIAGNOSIS — I2510 Atherosclerotic heart disease of native coronary artery without angina pectoris: Secondary | ICD-10-CM

## 2016-12-30 DIAGNOSIS — R0609 Other forms of dyspnea: Secondary | ICD-10-CM

## 2016-12-30 DIAGNOSIS — R0602 Shortness of breath: Secondary | ICD-10-CM | POA: Diagnosis not present

## 2016-12-30 DIAGNOSIS — R05 Cough: Secondary | ICD-10-CM | POA: Diagnosis not present

## 2016-12-30 DIAGNOSIS — J449 Chronic obstructive pulmonary disease, unspecified: Secondary | ICD-10-CM

## 2016-12-30 MED ORDER — FLUTICASONE-UMECLIDIN-VILANT 100-62.5-25 MCG/INH IN AEPB: 1.0000 | INHALATION_SPRAY | Freq: Every day | RESPIRATORY_TRACT | 0 refills | Status: DC

## 2016-12-30 NOTE — Patient Instructions (Signed)
For shortness of breath: I believe that your COPD is contributing, though I also think deconditioning and weight gain are also a big part of this We will check a lung function test We will check your oxygen level while you are exerting yourself We will check a chest x-ray  For COPD: Take Trelegy 1 puff daily no matter how you feel  For obstructive sleep apnea: Keep using CPAP daily  For weight loss: The following behaviors have been associated with weight loss: Weigh yourself daily Write down everything you eat Drink a glass of water prior to eating a meal Only eat when you are hungry Buy food from the periphery of the grocery store, not the middle  We will see you back in 1-2 weeks or sooner if needed

## 2016-12-30 NOTE — Progress Notes (Signed)
Subjective:    Patient ID: [name] [name], male    DOB: September 04, 1945, 71 y.o.   MRN: [id number]  HPI Chief Complaint  Patient presents with  . Advice Only    switching to BQ for copd care.  CAT: 74.   Taje is here to see me for breathing difficulty. He tells me that he has a lot of cardiac issues.  He has been evalutaed by severeal physicians over the years, some at [facility], some here in our clinic.  He tells me that the dyspnea has gotten worse.  He had a heart ablation in 2016 in the [facility] and since then he has only had rare episodes of afib.    Dyspnea: > with any walking he gets short of breath > worse with bending over > he can't walk 100 yards without stopping to catch his breath > walking to his mailbox on an incline, perhaps 50 yards, he stops three times > he doesn't feel tightness in his chest. > he wheezes when short of breath > he doesn't recall any flares of COPD, but he notes that he may have bene on prednisone for a cold at some point in the last year > he has tried a "bunch" of inhaled medicines in the past but he never felt that they helped.    Obstructive sleep apnea > he uses CPAP nightly > he will occasionally take the mask off only rarely, 1-2 every 2 months or so > he has gained 25 pounds this year > he doesn't really feel more sleepy during the daytime, few naps   He is gaining weight.  He is having a hard time exercising due to joint, back and foot pain. He is going to physical therapy for his plantar faciitis.  He says that the only physical activity he can do without completely exhausting him is going to the [facility] and swimming for 45 minutes.  He hasn't been there much lately.      Past Medical History:  Diagnosis Date  . Arthritis   . Asthma   . BPH (benign prostatic hypertrophy)   . CAD (coronary artery disease)    a. 07/2013: s/p DES to LAD, normal LVF.  [name] Complication of anesthesia    "I got all kinds of hallucinations"  . COPD  (chronic obstructive pulmonary disease) ([place])    a. 07/2013 PFT's mild airflow obstruction, no restriction, sev decrease in DLCO.  . Diverticulosis   . DVT (deep venous thrombosis) ([place])    a. 2010 Lower ext s/p back surgery.  [name] Dyspnea on exertion    a. 07/2013 PFT's mild airflow obstr   . GERD (gastroesophageal reflux disease)   . History of gout   . History of hiatal hernia   . Hx of echocardiogram 2015   Echo (06/2013): EF 60-65% normal wall motion, normal diastolic function, aortic sclerosis without stenosis, Trivial MR, mild SAM due to long, redundant mitral leaflets, mild RAE, normal RVSF  . Hypertension   . Macrocytic anemia    a. 07/2013: documented on prior labs.  . Obesity   . OSA (obstructive sleep apnea) 11/14/2015  . PAF (paroxysmal atrial fibrillation) ([place])    a. Flecainide discontinued 07/2013 in setting of CAD.; b. s/p PVI Ablation at [facility] (Dr Joseph Berkshire) 11/2014  . Pneumonia ~ 2010 X 1  . Pulmonary embolism ([place])    a. 2010 in setting of DVT post-op back surgery. b. Low prob VQ 07/2013.  [name] Spinal stenosis  Congential  . Transaminitis    a. 07/2013: mild.  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 Heart attack Paternal Grandfather        Over 70  . Atrial fibrillation Mother   . COPD Mother   . Heart disease Father   . Colon cancer Father   . Heart attack Brother 24  . Alcohol abuse Brother 36       Died post liver transplant  . Heart attack Paternal Uncle        Less than 9  . Stroke Neg Hx      Social History   Social History  . Marital status: Married    Spouse name: N/A  . Number of children: 4  . Years of education: N/A   Occupational History  . Retired from [employer] Retired   Social History Main Topics  . Smoking status: Former Smoker    Packs/day: 2.00    Years: 40.00    Types: Cigarettes    Quit date: 03/09/2000  . Smokeless tobacco: Never Used     Comment: smoked 1964-2002, up to 3 ppd   . Alcohol use 0.0 oz/week     Comment: 1-2 beers every 2 weeks (10/30/15) /// 02/08/2014 "drink a couple times/month"  . Drug use: No  . Sexual activity: Not on file     Comment: "sexually hx is none of your business" (02/08/2014)   Other Topics Concern  . Not on file   Social History Narrative  . No narrative on file     Allergies  Allergen Reactions  . Penicillins Anaphylaxis     Because of a history of documented adverse serious drug reaction;Medi Alert bracelet  is recommended     Outpatient Medications Prior to Visit  Medication Sig Dispense Refill  . amLODipine (NORVASC) 5 MG tablet Take 1 tablet (5 mg total) by mouth daily. -- Office visit needed for further refills 90 tablet 0  . aspirin 81 MG chewable tablet Chew 1 tablet by mouth daily.    [name] atorvastatin (LIPITOR) 10 MG tablet TAKE 1 TABLET EVERY EVENING 90 tablet 3  . DULoxetine (CYMBALTA) 30 MG capsule Take 1 capsule (30 mg total) by mouth daily. 30 capsule 5  . gabapentin (NEURONTIN) 300 MG capsule Take 1 capsule (300 mg total) by mouth at bedtime. 30 capsule 1  . metFORMIN (GLUCOPHAGE) 500 MG tablet Take 1 tablet (500 mg total) by mouth 2 (two) times daily with a meal. 180 tablet 3  . nitroGLYCERIN (NITROSTAT) 0.4 MG SL tablet Place 1 tablet (0.4 mg total) under the tongue every 5 (five) minutes as needed for chest pain (up to 3 doses). 25 tablet 4  . pantoprazole (PROTONIX) 40 MG tablet TAKE 1 TABLET DAILY 90 tablet 3  . traZODone (DESYREL) 100 MG tablet TAKE 1 TABLET BY MOUTH AT BEDTIME 30 tablet 2  . vitamin B-12 (CYANOCOBALAMIN) 1000 MCG tablet Take 1,000 mcg by mouth daily.    . Vitamin D, Ergocalciferol, (DRISDOL) 50000 units CAPS capsule     . warfarin (COUMADIN) 3 MG tablet TAKE ONE AND ONE-HALF TO TWO TABLETS DAILY AS DIRECTED BY COUMADIN CLINIC 180 tablet 1   No facility-administered medications prior to visit.       Review of Systems  Constitutional: Negative for fever and unexpected weight change.    HENT: Negative for congestion, dental problem, ear pain, nosebleeds, postnasal drip, rhinorrhea, sinus pressure, sneezing, sore throat  and trouble swallowing.   Eyes: Negative for redness and itching.  Respiratory: Positive for cough, chest tightness and shortness of breath. Negative for wheezing.   Cardiovascular: Negative for palpitations and leg swelling.  Gastrointestinal: Negative for nausea and vomiting.  Genitourinary: Negative for dysuria.  Musculoskeletal: Negative for joint swelling.  Skin: Negative for rash.  Neurological: Negative for headaches.  Hematological: Does not bruise/bleed easily.  Psychiatric/Behavioral: Negative for dysphoric mood. The patient is not nervous/anxious.        Objective:   Physical Exam  Vitals:   12/30/16 1136  BP: 122/78  Pulse: 75  SpO2: 97%  Weight: 279 lb (126.6 kg)  Height: 6\' 2"  (1.88 m)  RA  Gen: well appearing, no acute distress HENT: NCAT, OP clear, neck supple without masses Eyes: PERRL, EOMi Lymph: no cervical lymphadenopathy PULM: CTA B CV: RRR, no mgr, no JVD GI: BS+, soft, nontender, no hsm Derm: no rash or skin breakdown MSK: normal bulk and tone Neuro: A&Ox4, CN II-XII intact, strength 5/5 in all 4 extremities Psyche: normal mood and affect   Pulmonary function test: June 2016 spirometry ratio 69%, FEV1 2.65 L 68% predicted, FVC 3.82 L 75% of predicted  Chest imaging: June 2016 CT chest images independently reviewed showing mild centrilobular emphysema, subsegmental atelectasis in the right middle lobe, nonspecific interstitial changes (very mild, scant) in the left lower lobe near the periphery  Records reviewed from his 2017 visit with Dr. Halford Chessman.  He was treated for obstructive sleep apnea with CPAP and they advised to stop taking inhalers for COPD because he had not seen benefit from it.  Cardiac evaluation: Echocardiogram in April 2018 [facility] normal LV function, normal RV size and function Nuclear  stress test [facility] April 2018 showed no evidence of ischemia  Records from the cardiologist in [place] reviewed, see above  CBC    Component Value Date/Time   WBC 9.9 12/25/2016 1136   RBC 3.26 (L) 12/25/2016 1136   HGB 11.4 (L) 12/25/2016 1136   HCT 34.5 (L) 12/25/2016 1136   PLT 487.0 (H) 12/25/2016 1136   MCV 105.8 (H) 12/25/2016 1136   MCH 33.3 05/22/2014 0012   MCHC 33.0 12/25/2016 1136   RDW 16.8 (H) 12/25/2016 1136   LYMPHSABS 2.5 12/25/2016 1136   MONOABS 0.9 12/25/2016 1136   EOSABS 0.8 (H) 12/25/2016 1136   BASOSABS 0.1 12/25/2016 1136        Assessment & Plan:    DOE (dyspnea on exertion) - Plan: Pulmonary function test, DG Chest 2 View  COPD GOLD II  Dyspnea on exertion  Discussion: Elohim is here to see me today for worsening shortness of breath over the last year.  Objectively he has centrilobular emphysema seen on a CT scan from 2016 with moderate airflow obstruction from that ear as well.  I explained to him that the differential diagnosis of dyspnea is broad and includes lung conditions, heart problems, deconditioning, and obesity to name a few.  Fortunately he does not appear to be anemic based on his most recent blood tests.  I believe that COPD contributes to the severity of his shortness of breath but is not the sole cause.  His weight gain this year and physical deconditioning also contribute.  I am pleased that this year he had an essentially normal cardiac evaluation by his cardiologist up in [place].  We really need to try to get him exercising more frequently.  We need to get him on COPD treatment.  We  will also get lung function testing to see if this is worsened this year.  Notably, his serum eosinophil count was elevated a few weeks ago, I am not sure the significance of this as he cannot recall any recurrent exacerbations.  This probably means that he needs to be using an inhaled corticosteroid.  Plan: For shortness of breath: I  believe that your COPD is contributing, though I also think deconditioning and weight gain are also a big part of this We will check a lung function test We will check your oxygen level while you are exerting yourself We will check a chest x-ray  For COPD: Take Trelegy 1 puff daily no matter how you feel  For obstructive sleep apnea: Keep using CPAP daily  For weight loss: The following behaviors have been associated with weight loss: Weigh yourself daily Write down everything you eat Drink a glass of water prior to eating a meal Only eat when you are hungry Buy food from the periphery of the grocery store, not the middle  We will see you back in 1-2 weeks or sooner if needed    Current Outpatient Prescriptions:  .  amLODipine (NORVASC) 5 MG tablet, Take 1 tablet (5 mg total) by mouth daily. -- Office visit needed for further refills, Disp: 90 tablet, Rfl: 0 .  aspirin 81 MG chewable tablet, Chew 1 tablet by mouth daily., Disp: , Rfl:  .  atorvastatin (LIPITOR) 10 MG tablet, TAKE 1 TABLET EVERY EVENING, Disp: 90 tablet, Rfl: 3 .  DULoxetine (CYMBALTA) 30 MG capsule, Take 1 capsule (30 mg total) by mouth daily., Disp: 30 capsule, Rfl: 5 .  gabapentin (NEURONTIN) 300 MG capsule, Take 1 capsule (300 mg total) by mouth at bedtime., Disp: 30 capsule, Rfl: 1 .  metFORMIN (GLUCOPHAGE) 500 MG tablet, Take 1 tablet (500 mg total) by mouth 2 (two) times daily with a meal., Disp: 180 tablet, Rfl: 3 .  nitroGLYCERIN (NITROSTAT) 0.4 MG SL tablet, Place 1 tablet (0.4 mg total) under the tongue every 5 (five) minutes as needed for chest pain (up to 3 doses)., Disp: 25 tablet, Rfl: 4 .  pantoprazole (PROTONIX) 40 MG tablet, TAKE 1 TABLET DAILY, Disp: 90 tablet, Rfl: 3 .  traZODone (DESYREL) 100 MG tablet, TAKE 1 TABLET BY MOUTH AT BEDTIME, Disp: 30 tablet, Rfl: 2 .  vitamin B-12 (CYANOCOBALAMIN) 1000 MCG tablet, Take 1,000 mcg by mouth daily., Disp: , Rfl:  .  Vitamin D, Ergocalciferol, (DRISDOL)  50000 units CAPS capsule, , Disp: , Rfl:  .  warfarin (COUMADIN) 3 MG tablet, TAKE ONE AND ONE-HALF TO TWO TABLETS DAILY AS DIRECTED BY COUMADIN CLINIC, Disp: 180 tablet, Rfl: 1

## 2017-01-03 NOTE — Progress Notes (Signed)
[name] [address], [place] [address] Phone: [phone] Subjective:    I'm seeing this patient by the request  of:    CC: Leg pain, back pain follow-up  [id number]  [name] [name] is a 71 y.o. male coming in with complaint of   Back pain-patient seemed have some scar tissue in the left flank from previous back surgery. Started gabapentin. Increase to 300 mg over the course of the last month. Patient was to do home exercises and icing regimen.  Patient is also complaining of leg pain. Patient states that his pain is good at night but his pain increases during the day. Patient has tried taking the gabapentin during the day but that didn't help. Patient states that sometimes it seems to start in his foot and seems to radiate up. Sometimes it seems to be in his groin and radiates down.      Past Medical History:  Diagnosis Date  . Arthritis   . Asthma   . BPH (benign prostatic hypertrophy)   . CAD (coronary artery disease)    a. 07/2013: s/p DES to LAD, normal LVF.  [name] Complication of anesthesia    "I got all kinds of hallucinations"  . COPD (chronic obstructive pulmonary disease) ([place])    a. 07/2013 PFT's mild airflow obstruction, no restriction, sev decrease in DLCO.  . Diverticulosis   . DVT (deep venous thrombosis) ([place])    a. 2010 Lower ext s/p back surgery.  [name] Dyspnea on exertion    a. 07/2013 PFT's mild airflow obstr   . GERD (gastroesophageal reflux disease)   . History of gout   . History of hiatal hernia   . Hx of echocardiogram 2015   Echo (06/2013): EF 60-65% normal wall motion, normal diastolic function, aortic sclerosis without stenosis, Trivial MR, mild SAM due to long, redundant mitral leaflets, mild RAE, normal RVSF  . Hypertension   . Macrocytic anemia    a. 07/2013: documented on prior labs.  . Obesity   . OSA (obstructive sleep apnea) 11/14/2015  . PAF (paroxysmal atrial fibrillation) ([place])    a. Flecainide  discontinued 07/2013 in setting of CAD.; b. s/p PVI Ablation at [facility] (Dr Joseph Berkshire) 11/2014  . Pneumonia ~ 2010 X 1  . Pulmonary embolism ([place])    a. 2010 in setting of DVT post-op back surgery. b. Low prob VQ 07/2013.  [name] Spinal stenosis    Congential  . Transaminitis    a. 07/2013: mild.  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 ANTERIOR LUMBAR [place] ARTHROPLASTY  03/2008   "spacer poped out; had to repair"  . CATARACT EXTRACTION W/ INTRAOCULAR LENS  IMPLANT, BILATERAL Bilateral   . COLONOSCOPY W/ POLYPECTOMY  2004  . Colonoscopy with polypectomy  09/2011   2 tubular adenomas  . CORONARY ANGIOPLASTY WITH STENT PLACEMENT  08/08/2013   "1"  . JOINT REPLACEMENT    . LEFT AND RIGHT HEART CATHETERIZATION WITH CORONARY ANGIOGRAM N/A 08/07/2013   Procedure: LEFT AND RIGHT HEART CATHETERIZATION WITH CORONARY ANGIOGRAM;  Surgeon: Peter M Martinique, MD;  Location[facility] C[facility] LAB;  Service: Cardiovascular;  Laterality: N/A;  . LUMBAR FUSION  02/2008  . PERCUTANEOUS CORONARY STENT INTERVENTION (PCI-S)  08/07/2013   Procedure: PERCUTANEOUS CORONARY STENT INTERVENTION (PCI-S);  Surgeon: Peter M Martinique, MD;  Location: [facility] CATH LAB;  Service: Cardiovascular;;  . PVI ablation  11/2014   Dr. Venita Sheffield -  [facility]  . TOTAL HIP ARTHROPLASTY Bilateral   . VASECTOMY     Social History   Social History  . Marital status: Married    Spouse name: N/A  . Number of children: 4  . Years of education: N/A   Occupational History  . Retired from [employer] Retired   Social History Main Topics  . Smoking status: Former Smoker    Packs/day: 2.00    Years: 40.00    Types: Cigarettes    Quit date: 03/09/2000  . Smokeless tobacco: Never Used     Comment: smoked 1964-2002, up to 3 ppd  . Alcohol use 0.0 oz/week     Comment: 1-2 beers every 2 weeks (10/30/15) /// 02/08/2014 "drink a couple times/month"  . Drug use: No  . Sexual activity: Not Asked     Comment:  "sexually hx is none of your business" (02/08/2014)   Other Topics Concern  . None   Social History Narrative  . None   Allergies  Allergen Reactions  . Penicillins Anaphylaxis     Because of a history of documented adverse serious drug reaction;Medi Alert bracelet  is recommended   Family History  Problem Relation Age of Onset  . Heart attack Paternal Grandfather        Over 86  . Atrial fibrillation Mother   . COPD Mother   . Heart disease Father   . Colon cancer Father   . Heart attack Brother 22  . Alcohol abuse Brother 42       Died post liver transplant  . Heart attack Paternal Uncle        Less than 72  . Stroke Neg Hx      Past medical history, social, surgical and family history all reviewed in electronic medical record.  No pertanent information unless stated regarding to the chief complaint.   Review of Systems:Review of systems updated and as accurate as of 01/04/17  No headache, visual changes, nausea, vomiting, diarrhea, constipation, dizziness, abdominal pain, skin rash, fevers, chills, night sweats, weight loss, swollen lymph nodes, body aches, joint swelling, muscle aches, chest pain, shortness of breath, mood changes. Positive muscle aches  Objective  Blood pressure 118/78, pulse 68, height 6\' 2"  (1.88 m), weight 278 lb (126.1 kg), SpO2 96 %. Systems examined below as of 01/04/17   General: No apparent distress alert and oriented x3 mood and affect normal, dressed appropriately.  HEENT: Pupils equal, extraocular movements intact  Respiratory: Patient's speak in full sentences and does not appear short of breath  Cardiovascular: No lower extremity edema, non tender, no erythema  Skin: Warm dry intact with no signs of infection or rash on extremities or on axial skeleton.  Abdomen: Soft nontender  Neuro: Cranial nerves II through XII are intact, neurovascularly intact in all extremities with 2+ DTRs and 2+ pulses.  Lymph: No lymphadenopathy of posterior or  anterior cervical chain or axillae bilaterally.  Gait Mild antalgic MSK:  Non tender with full range of motion and good stability and symmetric strength and tone of shoulders, elbows, wrist, hip, knee and ankles bilaterally.  Back Exam:  Inspection: Loss of lordosis Motion: Flexion 45 deg, Extension 25 deg, Side Bending to 35 deg bilaterally,  Rotation to 35 deg bilaterally  SLR laying: Negative but significant tightness of the hamstrings bilaterally XSLR laying: Negative  Palpable tenderness: Tender to palpation in the paraspinal musculature lumbar spine right greater than left. FABER: Unable to do secondary to tightness. Sensory change: Gross sensation  intact to all lumbar and sacral dermatomes.  Reflexes: 2+ at both patellar tendons, 2+ at achilles tendons, Babinski's downgoing.  Strength at foot  4+ out of 5 but symmetric to the contralateral side      Impression and Recommendations:     This case required medical decision making of moderate complexity.      Note: This dictation was prepared with Dragon dictation along with smaller phrase technology. Any transcriptional errors that result from this process are unintentional.

## 2017-01-04 ENCOUNTER — Ambulatory Visit (INDEPENDENT_AMBULATORY_CARE_PROVIDER_SITE_OTHER): Payer: Medicare Other | Admitting: Family Medicine

## 2017-01-04 ENCOUNTER — Other Ambulatory Visit (INDEPENDENT_AMBULATORY_CARE_PROVIDER_SITE_OTHER): Payer: Medicare Other

## 2017-01-04 ENCOUNTER — Encounter: Payer: Self-pay | Admitting: Family Medicine

## 2017-01-04 VITALS — BP 118/78 | HR 68 | Ht 74.0 in | Wt 278.0 lb

## 2017-01-04 DIAGNOSIS — M2550 Pain in unspecified joint: Secondary | ICD-10-CM

## 2017-01-04 DIAGNOSIS — I2510 Atherosclerotic heart disease of native coronary artery without angina pectoris: Secondary | ICD-10-CM | POA: Diagnosis not present

## 2017-01-04 DIAGNOSIS — M48062 Spinal stenosis, lumbar region with neurogenic claudication: Secondary | ICD-10-CM | POA: Diagnosis not present

## 2017-01-04 LAB — IBC PANEL
Iron: 84 ug/dL (ref 42–165)
Saturation Ratios: 22.6 % (ref 20.0–50.0)
Transferrin: 265 mg/dL (ref 212.0–360.0)

## 2017-01-04 LAB — SEDIMENTATION RATE: Sed Rate: 50 mm/hr — ABNORMAL HIGH (ref 0–20)

## 2017-01-04 NOTE — Patient Instructions (Signed)
Good to see you Sorry we have not figured it out yet.  Get some labs downstairs today  Continue the gabapentin at night Effexor 37.5 mg daily to help with pain during the day  See me again in 4 weeks.

## 2017-01-04 NOTE — Assessment & Plan Note (Signed)
Patient has known spinal stenosis. I think that this is contributing to most of his discomfort and pain. Has had surgery in does have significant scar tissue formation. I am guessing that there is likely more than that. She denies having polyarthralgia. Concern that patient may be nonhealing secondary to some of his other comorbidities. Has had a anemia for quite some time. Seem to be microcytic. Being treated for B12 deficiency but no improvement in his laboratory workup. At this point I do feel that checking calcium levels, iron panel, as well as a smear could be beneficial. We discussed icing regimen, home exercises, which activities to do in which ones to avoid. Patient will be started on Effexor for more of the pain. Follow-up again in 4 weeks

## 2017-01-05 LAB — PTH, INTACT AND CALCIUM
Calcium: 9.2 mg/dL (ref 8.6–10.3)
PTH: 84 pg/mL — ABNORMAL HIGH (ref 14–64)

## 2017-01-05 LAB — CALCIUM, IONIZED: CALCIUM ION: 4.8 mg/dL (ref 4.8–5.6)

## 2017-01-06 ENCOUNTER — Other Ambulatory Visit: Payer: Self-pay | Admitting: *Deleted

## 2017-01-06 DIAGNOSIS — M722 Plantar fascial fibromatosis: Secondary | ICD-10-CM | POA: Diagnosis not present

## 2017-01-06 MED ORDER — VENLAFAXINE HCL 37.5 MG PO TABS: 37.5000 mg | ORAL_TABLET | Freq: Two times a day (BID) | ORAL | 0 refills | Status: DC

## 2017-01-06 NOTE — Telephone Encounter (Signed)
Refill done.  

## 2017-01-06 NOTE — Progress Notes (Unsigned)
Mildly

## 2017-01-11 ENCOUNTER — Ambulatory Visit (INDEPENDENT_AMBULATORY_CARE_PROVIDER_SITE_OTHER): Payer: Medicare Other | Admitting: Pulmonary Disease

## 2017-01-11 ENCOUNTER — Encounter: Payer: Self-pay | Admitting: Pulmonary Disease

## 2017-01-11 VITALS — BP 136/72 | HR 77 | Ht 74.0 in | Wt 277.0 lb

## 2017-01-11 DIAGNOSIS — I2510 Atherosclerotic heart disease of native coronary artery without angina pectoris: Secondary | ICD-10-CM | POA: Diagnosis not present

## 2017-01-11 DIAGNOSIS — J449 Chronic obstructive pulmonary disease, unspecified: Secondary | ICD-10-CM | POA: Diagnosis not present

## 2017-01-11 DIAGNOSIS — R0609 Other forms of dyspnea: Secondary | ICD-10-CM | POA: Diagnosis not present

## 2017-01-11 DIAGNOSIS — J849 Interstitial pulmonary disease, unspecified: Secondary | ICD-10-CM

## 2017-01-11 LAB — PULMONARY FUNCTION TEST
DL/VA % pred: 70 %
DL/VA: 3.38 ml/min/mmHg/L
DLCO UNC % PRED: 56 %
DLCO UNC: 21.27 ml/min/mmHg
DLCO cor % pred: 59 %
DLCO cor: 22.43 ml/min/mmHg
FEF 25-75 POST: 1.38 L/s
FEF 25-75 PRE: 1.28 L/s
FEF2575-%CHANGE-POST: 8 %
FEF2575-%PRED-PRE: 47 %
FEF2575-%Pred-Post: 50 %
FEV1-%Change-Post: 0 %
FEV1-%PRED-POST: 76 %
FEV1-%PRED-PRE: 76 %
FEV1-POST: 2.81 L
FEV1-PRE: 2.8 L
FEV1FVC-%CHANGE-POST: -1 %
FEV1FVC-%PRED-PRE: 87 %
FEV6-%CHANGE-POST: 3 %
FEV6-%PRED-PRE: 89 %
FEV6-%Pred-Post: 93 %
FEV6-Post: 4.41 L
FEV6-Pre: 4.25 L
FEV6FVC-%Change-Post: 1 %
FEV6FVC-%Pred-Post: 103 %
FEV6FVC-%Pred-Pre: 102 %
FVC-%CHANGE-POST: 1 %
FVC-%Pred-Post: 89 %
FVC-%Pred-Pre: 87 %
FVC-PRE: 4.4 L
FVC-Post: 4.48 L
POST FEV6/FVC RATIO: 99 %
PRE FEV1/FVC RATIO: 64 %
Post FEV1/FVC ratio: 63 %
Pre FEV6/FVC Ratio: 97 %
RV % PRED: 122 %
RV: 3.34 L
TLC % PRED: 99 %
TLC: 7.79 L

## 2017-01-11 LAB — NITRIC OXIDE: NITRIC OXIDE: 25

## 2017-01-11 MED ORDER — FLUTICASONE-UMECLIDIN-VILANT 100-62.5-25 MCG/INH IN AEPB: 1.0000 | INHALATION_SPRAY | Freq: Every day | RESPIRATORY_TRACT | 0 refills | Status: DC

## 2017-01-11 MED ORDER — FLUTICASONE-UMECLIDIN-VILANT 100-62.5-25 MCG/INH IN AEPB
1.0000 | INHALATION_SPRAY | Freq: Every day | RESPIRATORY_TRACT | 5 refills | Status: DC
Start: 2017-01-11 — End: 2017-02-10

## 2017-01-11 NOTE — Progress Notes (Signed)
PFT completed today.Katie Welchel,CMA  

## 2017-01-11 NOTE — Progress Notes (Signed)
Subjective:    Patient ID: [name] [name], male    DOB: 1945-11-05, 71 y.o.   MRN: [id number]  Synopsis: COPD, history of coronary artery disease.  History of DVT.  Self-referral in 2018 for dyspnea.  HPI Chief Complaint  Patient presents with  . Follow-up    review PFT.  pt states DOE is unchanged.     [name] says that he has had allergies in the past, he says that its typically worse int he spring than the fall.  He typically has a cough or sneeze in the spring, not much now.  Lately he says that he has constant wheezing.  He says that his wheezing is a little lower with the Trelegy.  He has a constant cough due to mucus in the back of his throat.  He ahs a constant cough.  He denies chest tightness.  He has some dyspnea and night headed ness when he bends over.  He has not been as good about weight loss since his wife bought a lot of candy for Halloween.    In general his shortness of breath is really not improved since the last visit.   Past Medical History:  Diagnosis Date  . Arthritis   . Asthma   . BPH (benign prostatic hypertrophy)   . CAD (coronary artery disease)    a. 07/2013: s/p DES to LAD, normal LVF.  [name] Complication of anesthesia    "I got all kinds of hallucinations"  . COPD (chronic obstructive pulmonary disease) ([place])    a. 07/2013 PFT's mild airflow obstruction, no restriction, sev decrease in DLCO.  . Diverticulosis   . DVT (deep venous thrombosis) ([place])    a. 2010 Lower ext s/p back surgery.  [name] Dyspnea on exertion    a. 07/2013 PFT's mild airflow obstr   . GERD (gastroesophageal reflux disease)   . History of gout   . History of hiatal hernia   . Hx of echocardiogram 2015   Echo (06/2013): EF 60-65% normal wall motion, normal diastolic function, aortic sclerosis without stenosis, Trivial MR, mild SAM due to long, redundant mitral leaflets, mild RAE, normal RVSF  . Hypertension   . Macrocytic anemia    a. 07/2013: documented on prior labs.  . Obesity   . OSA  (obstructive sleep apnea) 11/14/2015  . PAF (paroxysmal atrial fibrillation) ([place])    a. Flecainide discontinued 07/2013 in setting of CAD.; b. s/p PVI Ablation at [facility] (Dr Joseph Berkshire) 11/2014  . Pneumonia ~ 2010 X 1  . Pulmonary embolism ([place])    a. 2010 in setting of DVT post-op back surgery. b. Low prob VQ 07/2013.  [name] Spinal stenosis    Congential  . Transaminitis    a. 07/2013: mild.  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  HENT: Negative for congestion, dental problem, ear pain, nosebleeds, postnasal drip, rhinorrhea, sinus pressure, sneezing, sore throat and trouble swallowing.   Respiratory: Positive for cough and shortness of breath. Negative for chest tightness and wheezing.   Cardiovascular: Negative for palpitations and leg swelling.       Objective:   Physical Exam  Vitals:   01/11/17 1007  BP: 136/72  Pulse: 77  SpO2: 98%  Weight: 277 lb (125.6 kg)  Height: 6\' 2"  (1.88 m)  RA  Gen: obese but well appearing HENT: OP clear, TM's clear, neck supple PULM: Wheezing bilaterally, crackles RLL  B, normal percussion CV: RRR, no mgr, trace edema GI: BS+, soft, nontender Derm: no cyanosis or rash Psyche: normal mood and affect   Pulmonary function test: June 2016 spirometry ratio 69%, FEV1 2.65 L 68% predicted, FVC 3.82 L 75% of predicted November 2018 pulmonary function testing ratio 63%, FEV1 2.81 L 76% predicted, FVC 4.48 L 89% predicted, total lung capacity 7.8 L 99% predicted, residual volume 3.3 L 122% predicted, DLCO 21.27 mL 56% predicted  Exhaled nitric oxide testing: November 2018 25 ppm, taking inhaled corticosteroid at the time  Chest imaging: June 2016 CT chest images independently reviewed showing mild centrilobular emphysema, subsegmental atelectasis in the right middle lobe, nonspecific interstitial changes (very mild, scant) in the left lower lobe near the  periphery October 2018 chest x-ray images independently reviewed showing if his edema bilaterally, mild linear atelectasis in the bases  Records reviewed from his 2017 visit with Dr. Halford Chessman.  He was treated for obstructive sleep apnea with CPAP and they advised to stop taking inhalers for COPD because he had not seen benefit from it.  Cardiac evaluation: Echocardiogram in April 2018 [facility] clinic normal LV function, normal RV size and function Nuclear stress test [facility] clinic April 2018 showed no evidence of ischemia    CBC    Component Value Date/Time   WBC 9.9 12/25/2016 1136   RBC 3.26 (L) 12/25/2016 1136   HGB 11.4 (L) 12/25/2016 1136   HCT 34.5 (L) 12/25/2016 1136   PLT 487.0 (H) 12/25/2016 1136   MCV 105.8 (H) 12/25/2016 1136   MCH 33.3 05/22/2014 0012   MCHC 33.0 12/25/2016 1136   RDW 16.8 (H) 12/25/2016 1136   LYMPHSABS 2.5 12/25/2016 1136   MONOABS 0.9 12/25/2016 1136   EOSABS 0.8 (H) 12/25/2016 1136   BASOSABS 0.1 12/25/2016 1136        Assessment & Plan:    DOE (dyspnea on exertion)  COPD GOLD II  Morbid (severe) obesity due to excess calories ([place])  Discussion: [name] says that he still feeling shortness of breath and he still has some wheezing.  Though with the addition of an inhaled corticosteroid he saw some benefit.  Notably, he has had an elevated serum eosinophil count is exhaled nitric oxide test was slightly elevated today even while taking steroid.  Further, he has wheezing on physical exam today.  So I would like for him to see an allergist to assess for an syndrome or allergies contributing to his shortness of breath.  I am pleased that his lung function testing only shows moderate airflow obstruction which is actually improved compared to his 2016 values.  However, he did have a notably decreased diffusion capacity and some atelectasis on his chest x-ray and some crackles on physical exam.  So I would like for him to have a high-resolution CT scan  of the chest to make sure there is no evidence of an interstitial lung disease causing his shortness of breath.  That all being said, I still feel that obesity and deconditioning are contributing significantly to his shortness of breath.  Plan: COPD: I am concerned that you have an allergy making this worse. Today's exhaled nitric oxide test was slightly elevated even while you are taking an inhaled steroid, so I would like for you to see an allergist to assess for allergic inflammation in the airways. Keep taking Trelegy, 1 puff daily I'd like for you to see an allergist  Shortness of breath: Given the abnormality seen on the lung function testing  I want to arrange for a CT scan of your chest to make sure there is no evidence of scarring there Continue to exercise and try to lose weight  Obesity: The following behaviors have been associated with weight loss: Weigh yourself daily Write down everything you eat Drink a glass of water prior to eating a meal Only eat when you are hungry Buy food from the periphery of the grocery store, not the middle  We will see you back in about 6-8 weeks or sooner if needed  Current Outpatient Medications:  .  amLODipine (NORVASC) 5 MG tablet, Take 1 tablet (5 mg total) by mouth daily. -- Office visit needed for further refills, Disp: 90 tablet, Rfl: 0 .  aspirin 81 MG chewable tablet, Chew 1 tablet by mouth daily., Disp: , Rfl:  .  atorvastatin (LIPITOR) 10 MG tablet, TAKE 1 TABLET EVERY EVENING, Disp: 90 tablet, Rfl: 3 .  DULoxetine (CYMBALTA) 30 MG capsule, Take 1 capsule (30 mg total) by mouth daily., Disp: 30 capsule, Rfl: 5 .  Fluticasone-Umeclidin-Vilant (TRELEGY ELLIPTA) 100-62.5-25 MCG/INH AEPB, Inhale 1 puff into the lungs daily., Disp: 1 each, Rfl: 0 .  gabapentin (NEURONTIN) 300 MG capsule, Take 1 capsule (300 mg total) by mouth at bedtime., Disp: 30 capsule, Rfl: 1 .  metFORMIN (GLUCOPHAGE) 500 MG tablet, Take 1 tablet (500 mg total) by  mouth 2 (two) times daily with a meal., Disp: 180 tablet, Rfl: 3 .  methocarbamol (ROBAXIN) 500 MG tablet, Take 500 mg daily as needed by mouth for muscle spasms., Disp: , Rfl:  .  nitroGLYCERIN (NITROSTAT) 0.4 MG SL tablet, Place 1 tablet (0.4 mg total) under the tongue every 5 (five) minutes as needed for chest pain (up to 3 doses)., Disp: 25 tablet, Rfl: 4 .  pantoprazole (PROTONIX) 40 MG tablet, TAKE 1 TABLET DAILY, Disp: 90 tablet, Rfl: 3 .  venlafaxine (EFFEXOR) 37.5 MG tablet, Take 1 tablet (37.5 mg total) by mouth 2 (two) times daily., Disp: 90 tablet, Rfl: 0 .  vitamin B-12 (CYANOCOBALAMIN) 1000 MCG tablet, Take 1,000 mcg by mouth daily., Disp: , Rfl:  .  Vitamin D, Ergocalciferol, (DRISDOL) 50000 units CAPS capsule, , Disp: , Rfl:  .  warfarin (COUMADIN) 3 MG tablet, TAKE ONE AND ONE-HALF TO TWO TABLETS DAILY AS DIRECTED BY COUMADIN CLINIC, Disp: 180 tablet, Rfl: 1

## 2017-01-11 NOTE — Patient Instructions (Addendum)
COPD: I am concerned that you have an allergy making this worse. Today's exhaled nitric oxide test was slightly elevated even while you are taking an inhaled steroid, so I would like for you to see an allergist to assess for allergic inflammation in the airways. Keep taking Trelegy, 1 puff daily I'd like for you to see an allergist  Shortness of breath: Given the abnormality seen on the lung function testing I want to arrange for a CT scan of your chest to make sure there is no evidence of scarring there Continue to exercise and try to lose weight  Obesity: The following behaviors have been associated with weight loss: Weigh yourself daily Write down everything you eat Drink a glass of water prior to eating a meal Only eat when you are hungry Buy food from the periphery of the grocery store, not the middle  We will see you back in about 6-8 weeks or sooner if needed

## 2017-01-13 ENCOUNTER — Ambulatory Visit (INDEPENDENT_AMBULATORY_CARE_PROVIDER_SITE_OTHER): Payer: Medicare Other | Admitting: Pharmacist

## 2017-01-13 ENCOUNTER — Telehealth: Payer: Self-pay | Admitting: Internal Medicine

## 2017-01-13 DIAGNOSIS — I4891 Unspecified atrial fibrillation: Secondary | ICD-10-CM

## 2017-01-13 DIAGNOSIS — I480 Paroxysmal atrial fibrillation: Secondary | ICD-10-CM | POA: Diagnosis not present

## 2017-01-13 DIAGNOSIS — Z86718 Personal history of other venous thrombosis and embolism: Secondary | ICD-10-CM

## 2017-01-13 DIAGNOSIS — Z7901 Long term (current) use of anticoagulants: Secondary | ICD-10-CM | POA: Diagnosis not present

## 2017-01-13 LAB — POCT INR: INR: 2

## 2017-01-13 NOTE — Addendum Note (Signed)
Addended by: Binnie Rail on: 01/13/2017 01:54 PM   Modules accepted: Orders

## 2017-01-13 NOTE — Telephone Encounter (Signed)
Have him stop the cymbalta and just take the effexor.  We can increase this to a higher dose if needed.

## 2017-01-13 NOTE — Telephone Encounter (Addendum)
Patient's wife called and stated the pharmacy told her it may not be safe for him to take DULoxetine (CYMBALTA) 30 MG capsule and venlafaxine (EFFEXOR) 37.5 MG tablet - (Dr.Smith sent this RX in on 10/31) together. They are just wanting to make sure it is ok for him to be taking both of these medications. Please advise. Thank you.

## 2017-01-13 NOTE — Telephone Encounter (Signed)
Spoke with pt's wife to inform.  

## 2017-01-26 DIAGNOSIS — I2510 Atherosclerotic heart disease of native coronary artery without angina pectoris: Secondary | ICD-10-CM | POA: Diagnosis not present

## 2017-01-26 DIAGNOSIS — R0602 Shortness of breath: Secondary | ICD-10-CM | POA: Diagnosis not present

## 2017-01-26 DIAGNOSIS — I480 Paroxysmal atrial fibrillation: Secondary | ICD-10-CM | POA: Diagnosis not present

## 2017-01-26 DIAGNOSIS — Z7901 Long term (current) use of anticoagulants: Secondary | ICD-10-CM | POA: Diagnosis not present

## 2017-01-26 DIAGNOSIS — E6609 Other obesity due to excess calories: Secondary | ICD-10-CM | POA: Diagnosis not present

## 2017-01-26 DIAGNOSIS — Z955 Presence of coronary angioplasty implant and graft: Secondary | ICD-10-CM | POA: Diagnosis not present

## 2017-01-26 DIAGNOSIS — R001 Bradycardia, unspecified: Secondary | ICD-10-CM | POA: Diagnosis not present

## 2017-01-26 DIAGNOSIS — Z9889 Other specified postprocedural states: Secondary | ICD-10-CM | POA: Diagnosis not present

## 2017-01-26 DIAGNOSIS — J449 Chronic obstructive pulmonary disease, unspecified: Secondary | ICD-10-CM | POA: Diagnosis not present

## 2017-01-26 DIAGNOSIS — I10 Essential (primary) hypertension: Secondary | ICD-10-CM | POA: Diagnosis not present

## 2017-01-26 DIAGNOSIS — Z8679 Personal history of other diseases of the circulatory system: Secondary | ICD-10-CM | POA: Diagnosis not present

## 2017-02-04 DIAGNOSIS — M19011 Primary osteoarthritis, right shoulder: Secondary | ICD-10-CM | POA: Diagnosis not present

## 2017-02-04 DIAGNOSIS — M542 Cervicalgia: Secondary | ICD-10-CM | POA: Diagnosis not present

## 2017-02-04 DIAGNOSIS — M47812 Spondylosis without myelopathy or radiculopathy, cervical region: Secondary | ICD-10-CM | POA: Diagnosis not present

## 2017-02-08 ENCOUNTER — Inpatient Hospital Stay: Admission: RE | Admit: 2017-02-08 | Payer: Medicare Other | Source: Ambulatory Visit

## 2017-02-09 DIAGNOSIS — D721 Eosinophilia: Secondary | ICD-10-CM | POA: Diagnosis not present

## 2017-02-09 DIAGNOSIS — R05 Cough: Secondary | ICD-10-CM | POA: Diagnosis not present

## 2017-02-09 DIAGNOSIS — J301 Allergic rhinitis due to pollen: Secondary | ICD-10-CM | POA: Diagnosis not present

## 2017-02-09 DIAGNOSIS — R0602 Shortness of breath: Secondary | ICD-10-CM | POA: Diagnosis not present

## 2017-02-10 ENCOUNTER — Ambulatory Visit (INDEPENDENT_AMBULATORY_CARE_PROVIDER_SITE_OTHER): Payer: Medicare Other | Admitting: Family Medicine

## 2017-02-10 ENCOUNTER — Encounter: Payer: Self-pay | Admitting: Family Medicine

## 2017-02-10 VITALS — BP 150/90 | HR 88 | Ht 74.0 in | Wt 271.0 lb

## 2017-02-10 DIAGNOSIS — I2510 Atherosclerotic heart disease of native coronary artery without angina pectoris: Secondary | ICD-10-CM

## 2017-02-10 DIAGNOSIS — M549 Dorsalgia, unspecified: Secondary | ICD-10-CM | POA: Diagnosis not present

## 2017-02-10 MED ORDER — VENLAFAXINE HCL ER 75 MG PO CP24: 75.0000 mg | ORAL_CAPSULE | Freq: Every day | ORAL | 3 refills | Status: DC

## 2017-02-10 NOTE — Progress Notes (Signed)
[name] [address], [place] [address] Phone: [phone] Subjective:    I'm seeing this patient by the request  of:    CC: Back pain follow-up  ALP:[id number]  [name] [name] is a 71 y.o. male coming in with complaint of back pain.  Patient does have a history of spinal stenosis at L3-L4 after a fusion at L2 through L5.  Patient seemed to be having more of a left flank pain likely secondary to some muscle imbalances since surgery as well as potential scar tissue formation.  Started patient on a very low dose of a muscle relaxer as well as Effexor.  Patient was to continue gabapentin.  Laboratory workup did show an increased sedimentation rate as well as found to have severe macrocytic anemia with elevated platelets Patient states the pain now seems to be only at night.  Patient states that it seems to be cramping in the calf on the left side only.  Seems to stop below the knee.  Patient sometimes states that it seems to be associated with coldness of the foot and ankle as well.      Past Medical History:  Diagnosis Date  . Arthritis   . Asthma   . BPH (benign prostatic hypertrophy)   . CAD (coronary artery disease)    a. 07/2013: s/p DES to LAD, normal LVF.  [name] Complication of anesthesia    "I got all kinds of hallucinations"  . COPD (chronic obstructive pulmonary disease) ([place])    a. 07/2013 PFT's mild airflow obstruction, no restriction, sev decrease in DLCO.  . Diverticulosis   . DVT (deep venous thrombosis) ([place])    a. 2010 Lower ext s/p back surgery.  [name] Dyspnea on exertion    a. 07/2013 PFT's mild airflow obstr   . GERD (gastroesophageal reflux disease)   . History of gout   . History of hiatal hernia   . Hx of echocardiogram 2015   Echo (06/2013): EF 60-65% normal wall motion, normal diastolic function, aortic sclerosis without stenosis, Trivial MR, mild SAM due to long, redundant mitral leaflets, mild RAE, normal RVSF  .  Hypertension   . Macrocytic anemia    a. 07/2013: documented on prior labs.  . Obesity   . OSA (obstructive sleep apnea) 11/14/2015  . PAF (paroxysmal atrial fibrillation) ([place])    a. Flecainide discontinued 07/2013 in setting of CAD.; b. s/p PVI Ablation at [facility] (Dr Joseph Berkshire) 11/2014  . Pneumonia ~ 2010 X 1  . Pulmonary embolism ([place])    a. 2010 in setting of DVT post-op back surgery. b. Low prob VQ 07/2013.  [name] Spinal stenosis    Congential  . Transaminitis    a. 07/2013: mild.  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 ANTERIOR LUMBAR [place] ARTHROPLASTY  03/2008   "spacer poped out; had to repair"  . CATARACT EXTRACTION W/ INTRAOCULAR LENS  IMPLANT, BILATERAL Bilateral   . COLONOSCOPY W/ POLYPECTOMY  2004  . Colonoscopy with polypectomy  09/2011   2 tubular adenomas  . CORONARY ANGIOPLASTY WITH STENT PLACEMENT  08/08/2013   "1"  . JOINT REPLACEMENT    . LEFT AND RIGHT HEART CATHETERIZATION WITH CORONARY ANGIOGRAM N/A 08/07/2013   Procedure: LEFT AND RIGHT HEART CATHETERIZATION WITH CORONARY ANGIOGRAM;  Surgeon: Peter M Martinique, MD;  Location: [facility] CATH LAB;  Service: Cardiovascular;  Laterality: N/A;  . LUMBAR FUSION  02/2008  . PERCUTANEOUS  CORONARY STENT INTERVENTION (PCI-S)  08/07/2013   Procedure: PERCUTANEOUS CORONARY STENT INTERVENTION (PCI-S);  Surgeon: Peter M Martinique, MD;  Location[facility] CATH LAB;  Service: Cardiovascular;;  . PVI ablation  11/2014   Dr. Venita Sheffield [facility]  . TOTAL HIP ARTHROPLASTY Bilateral   . VASECTOMY     Social History   Socioeconomic History  . Marital status: Married    Spouse name: Not on file  . Number of children: 4  . Years of education: Not on file  . Highest education level: Not on file  Social Needs  . Financial resource strain: Not on file  . Food insecurity - worry: Not on file  . Food insecurity - inability: Not on file  . Transportation needs - medical: Not on file  .  Transportation needs - non-medical: Not on file  Occupational History  . Occupation: Retired from [employer]    Employer: RETIRED  Tobacco Use  . Smoking status: Former Smoker    Packs/day: 2.00    Years: 40.00    Pack years: 80.00    Types: Cigarettes    Last attempt to quit: 03/09/2000    Years since quitting: 16.9  . Smokeless tobacco: Never Used  . Tobacco comment: smoked 1964-2002, up to 3 ppd  Substance and Sexual Activity  . Alcohol use: Yes    Alcohol/week: 0.0 oz    Comment: 1-2 beers every 2 weeks (10/30/15) /// 02/08/2014 "drink a couple times/month"  . Drug use: No  . Sexual activity: Not on file    Comment: "sexually hx is none of your business" (02/08/2014)  Other Topics Concern  . Not on file  Social History Narrative  . Not on file   Allergies  Allergen Reactions  . Penicillins Anaphylaxis     Because of a history of documented adverse serious drug reaction;Medi Alert bracelet  is recommended   Family History  Problem Relation Age of Onset  . Heart attack Paternal Grandfather        Over 41  . Atrial fibrillation Mother   . COPD Mother   . Heart disease Father   . Colon cancer Father   . Heart attack Brother 55  . Alcohol abuse Brother 67       Died post liver transplant  . Heart attack Paternal Uncle        Less than 27  . Stroke Neg Hx      Past medical history, social, surgical and family history all reviewed in electronic medical record.  No pertanent information unless stated regarding to the chief complaint.   Review of Systems:Review of systems updated and as accurate as of 02/10/17  No headache, visual changes, nausea, vomiting, diarrhea, constipation, dizziness, abdominal pain, skin rash, fevers, chills, night sweats, weight loss, swollen lymph nodes, body aches, joint swelling, muscle aches, chest pain, shortness of breath, mood changes.   Objective  There were no vitals taken for this visit. Systems examined below as of 02/10/17   General: No  apparent distress alert and oriented x3 mood and affect normal, dressed appropriately.  HEENT: Pupils equal, extraocular movements intact  Respiratory: Patient's speak in full sentences and does not appear short of breath  Cardiovascular: No lower extremity edema, non tender, no erythema  Skin: Warm dry intact with no signs of infection or rash on extremities or on axial skeleton.  Abdomen: Soft nontender  Neuro: Cranial nerves II through XII are intact, neurovascularly intact in all extremities with 2+ DTRs and  2+ pulses.  Lymph: No lymphadenopathy of posterior or anterior cervical chain or axillae bilaterally.  Gait normal with good balance and coordination.  MSK:  Non tender with full range of motion and good stability and symmetric strength and tone of shoulders, elbows, wrist, hip, knee and ankles bilaterally.     Impression and Recommendations:     This case required medical decision making of moderate complexity.      Note: This dictation was prepared with Dragon dictation along with smaller phrase technology. Any transcriptional errors that result from this process are unintentional.

## 2017-02-10 NOTE — Patient Instructions (Signed)
Good to see you  We will get a ABI of the leg to make sure that we are getting good blood flow.  Increase effexor to 75 mg daily  Continue the vitamins for now.  Keep watching the symptoms See me again in 3 weeks and if not better then we will consider other labs.  Happy holidays!

## 2017-02-15 ENCOUNTER — Inpatient Hospital Stay: Admission: RE | Admit: 2017-02-15 | Payer: Medicare Other | Source: Ambulatory Visit

## 2017-02-15 ENCOUNTER — Ambulatory Visit: Payer: Medicare Other | Admitting: Pulmonary Disease

## 2017-02-15 ENCOUNTER — Inpatient Hospital Stay (HOSPITAL_COMMUNITY)
Admission: RE | Admit: 2017-02-15 | Payer: Medicare Other | Source: Ambulatory Visit | Attending: Family Medicine | Admitting: Family Medicine

## 2017-02-19 ENCOUNTER — Ambulatory Visit (HOSPITAL_COMMUNITY)
Admission: RE | Admit: 2017-02-19 | Discharge: 2017-02-19 | Disposition: A | Discharge status: Home / Self Care | Payer: Medicare Other | Source: Ambulatory Visit | Attending: Cardiovascular Disease | Admitting: Cardiovascular Disease

## 2017-02-19 DIAGNOSIS — M549 Dorsalgia, unspecified: Secondary | ICD-10-CM | POA: Insufficient documentation

## 2017-02-22 ENCOUNTER — Ambulatory Visit (INDEPENDENT_AMBULATORY_CARE_PROVIDER_SITE_OTHER)
Admission: RE | Admit: 2017-02-22 | Discharge: 2017-02-22 | Disposition: A | Discharge status: Home / Self Care | Payer: Medicare Other | Source: Ambulatory Visit | Attending: Pulmonary Disease | Admitting: Pulmonary Disease

## 2017-02-22 DIAGNOSIS — J849 Interstitial pulmonary disease, unspecified: Secondary | ICD-10-CM | POA: Diagnosis not present

## 2017-02-22 DIAGNOSIS — R0602 Shortness of breath: Secondary | ICD-10-CM | POA: Diagnosis not present

## 2017-02-23 ENCOUNTER — Encounter: Payer: Self-pay | Admitting: Pulmonary Disease

## 2017-02-23 ENCOUNTER — Telehealth: Payer: Self-pay | Admitting: Pulmonary Disease

## 2017-02-23 ENCOUNTER — Ambulatory Visit (INDEPENDENT_AMBULATORY_CARE_PROVIDER_SITE_OTHER): Payer: Medicare Other | Admitting: Pulmonary Disease

## 2017-02-23 VITALS — BP 150/86 | HR 62 | Ht 74.0 in | Wt 266.0 lb

## 2017-02-23 DIAGNOSIS — R0609 Other forms of dyspnea: Secondary | ICD-10-CM

## 2017-02-23 DIAGNOSIS — I2510 Atherosclerotic heart disease of native coronary artery without angina pectoris: Secondary | ICD-10-CM

## 2017-02-23 DIAGNOSIS — J449 Chronic obstructive pulmonary disease, unspecified: Secondary | ICD-10-CM | POA: Diagnosis not present

## 2017-02-23 NOTE — Progress Notes (Signed)
Subjective:    Patient ID: [name] [name], male    DOB: Nov 04, 1945, 71 y.o.   MRN: [id number]  Synopsis: COPD, history of coronary artery disease.  History of DVT.  Self-referral in 2018 for dyspnea.  HPI  Chief Complaint  Patient presents with  . Follow-up    pt states breathing is baseline. pt reports of sob with exertion, mild non prod cough & wheezing.     Herald says that he saw the allergist and was told that he had a dust might allergy and cockroach allergy.  They are going to look at a new mattress today.  He may have some grass allergy as well. Since the last visit he sees no change in his symptoms.  He will cough up mucus about once per month.  He has a constant clearing of his throat but nothing else.  He has not needed prednisone or an antibiotic.  He still has dyspnea on exertion.  He still has to stop and catch his breath his breath after walking 100 feet.  He is now starting a regular exercise routine.  He has lost a few pounds, about 15-20 pounds by avoiding candy.  He eats a lot of fruit during the daytime.    Past Medical History:  Diagnosis Date  . Arthritis   . Asthma   . BPH (benign prostatic hypertrophy)   . CAD (coronary artery disease)    a. 07/2013: s/p DES to LAD, normal LVF.  [name] Complication of anesthesia    "I got all kinds of hallucinations"  . COPD (chronic obstructive pulmonary disease) ([place])    a. 07/2013 PFT's mild airflow obstruction, no restriction, sev decrease in DLCO.  . Diverticulosis   . DVT (deep venous thrombosis) ([place])    a. 2010 Lower ext s/p back surgery.  [name] Dyspnea on exertion    a. 07/2013 PFT's mild airflow obstr   . GERD (gastroesophageal reflux disease)   . History of gout   . History of hiatal hernia   . Hx of echocardiogram 2015   Echo (06/2013): EF 60-65% normal wall motion, normal diastolic function, aortic sclerosis without stenosis, Trivial MR, mild SAM due to long, redundant mitral leaflets, mild RAE, normal RVSF  .  Hypertension   . Macrocytic anemia    a. 07/2013: documented on prior labs.  . Obesity   . OSA (obstructive sleep apnea) 11/14/2015  . PAF (paroxysmal atrial fibrillation) ([place])    a. Flecainide discontinued 07/2013 in setting of CAD.; b. s/p PVI Ablation at [facility] (Dr Joseph Berkshire) 11/2014  . Pneumonia ~ 2010 X 1  . Pulmonary embolism ([place])    a. 2010 in setting of DVT post-op back surgery. b. Low prob VQ 07/2013.  [name] Spinal stenosis    Congential  . Transaminitis    a. 07/2013: mild.  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  HENT: Negative for congestion, dental problem, ear pain, nosebleeds, postnasal drip, rhinorrhea, sinus pressure, sneezing, sore throat and trouble swallowing.   Respiratory: Positive for cough and shortness of breath. Negative for chest tightness and wheezing.   Cardiovascular: Negative for palpitations and leg swelling.       Objective:   Physical Exam  Vitals:   02/23/17 1017  BP: (!) 150/86  Pulse: 62  SpO2: 95%  Weight: 266 lb (120.7 kg)  Height: 6\' 2"  (1.88 m)   RA  Gen: well  appearing HENT: OP clear, TM's clear, neck supple PULM: CTA B, normal percussion CV: RRR, no mgr, trace edema GI: BS+, soft, nontender Derm: no cyanosis or rash Psyche: normal mood and affect    Pulmonary function test: June 2016 spirometry ratio 69%, FEV1 2.65 L 68% predicted, FVC 3.82 L 75% of predicted November 2018 pulmonary function testing ratio 63%, FEV1 2.81 L 76% predicted, FVC 4.48 L 89% predicted, total lung capacity 7.8 L 99% predicted, residual volume 3.3 L 122% predicted, DLCO 21.27 mL 56% predicted  Exhaled nitric oxide testing: November 2018 25 ppm, taking inhaled corticosteroid at the time  Chest imaging: June 2016 CT chest images independently reviewed showing mild centrilobular emphysema, subsegmental atelectasis in the right middle lobe, nonspecific  interstitial changes (very mild, scant) in the left lower lobe near the periphery October 2018 chest x-ray images independently reviewed showing if his edema bilaterally, mild linear atelectasis in the bases December 2018 CT chest images independently reviewed showing no evidence of interstitial lung disease, normal pulmonary parenchyma seen, aortic calcification noted  Cardiac evaluation: Echocardiogram in April 2018 [facility] normal LV function, normal RV size and function Nuclear stress test [facility] April 2018 showed no evidence of ischemia  CBC    Component Value Date/Time   WBC 9.9 12/25/2016 1136   RBC 3.26 (L) 12/25/2016 1136   HGB 11.4 (L) 12/25/2016 1136   HCT 34.5 (L) 12/25/2016 1136   PLT 487.0 (H) 12/25/2016 1136   MCV 105.8 (H) 12/25/2016 1136   MCH 33.3 05/22/2014 0012   MCHC 33.0 12/25/2016 1136   RDW 16.8 (H) 12/25/2016 1136   LYMPHSABS 2.5 12/25/2016 1136   MONOABS 0.9 12/25/2016 1136   EOSABS 0.8 (H) 12/25/2016 1136   BASOSABS 0.1 12/25/2016 1136        Assessment & Plan:     COPD GOLD II  DOE (dyspnea on exertion)  Discussion: In general this has been a stable interval for [name] [name].  He does have COPD with asthma and that he has elevated serum eosinophils and significant allergy.  Despite that, he does not have recurrent exacerbations so I do not think that treating him with something like anti-IL-5 antibody will make much of a difference.  I think that he needs to exercise more and try to lose weight.  The CT scan showed no evidence of additional lung disease which is a good thing.  COPD with asthma: Keep taking Trelegy 1 puff daily I agree completely with the plan to change out the bedding and mattress in your house Stay active, exercise regularly, consider purchasing the X5X booklet from the [employer] describing an exercise routine  We will see you back in 4-6 months or sooner if needed     Current Outpatient  Medications:  .  amLODipine (NORVASC) 5 MG tablet, Take 1 tablet (5 mg total) by mouth daily. -- Office visit needed for further refills, Disp: 90 tablet, Rfl: 0 .  aspirin 81 MG chewable tablet, Chew 1 tablet by mouth daily., Disp: , Rfl:  .  atorvastatin (LIPITOR) 10 MG tablet, TAKE 1 TABLET EVERY EVENING, Disp: 90 tablet, Rfl: 3 .  Fluticasone-Umeclidin-Vilant (TRELEGY ELLIPTA) 100-62.5-25 MCG/INH AEPB, Inhale 1 puff daily into the lungs., Disp: 1 each, Rfl: 0 .  gabapentin (NEURONTIN) 300 MG capsule, Take 1 capsule (300 mg total) by mouth at bedtime., Disp: 30 capsule, Rfl: 1 .  metFORMIN (GLUCOPHAGE) 500 MG tablet, Take 1 tablet (500 mg total) by mouth 2 (two) times  daily with a meal., Disp: 180 tablet, Rfl: 3 .  methocarbamol (ROBAXIN) 500 MG tablet, Take 500 mg daily as needed by mouth for muscle spasms., Disp: , Rfl:  .  nitroGLYCERIN (NITROSTAT) 0.4 MG SL tablet, Place 1 tablet (0.4 mg total) under the tongue every 5 (five) minutes as needed for chest pain (up to 3 doses)., Disp: 25 tablet, Rfl: 4 .  pantoprazole (PROTONIX) 40 MG tablet, TAKE 1 TABLET DAILY, Disp: 90 tablet, Rfl: 3 .  venlafaxine XR (EFFEXOR XR) 75 MG 24 hr capsule, Take 1 capsule (75 mg total) by mouth daily with breakfast., Disp: 30 capsule, Rfl: 3 .  vitamin B-12 (CYANOCOBALAMIN) 1000 MCG tablet, Take 1,000 mcg by mouth daily., Disp: , Rfl:  .  Vitamin D, Ergocalciferol, (DRISDOL) 50000 units CAPS capsule, , Disp: , Rfl:  .  warfarin (COUMADIN) 3 MG tablet, TAKE ONE AND ONE-HALF TO TWO TABLETS DAILY AS DIRECTED BY COUMADIN CLINIC, Disp: 180 tablet, Rfl: 1

## 2017-02-23 NOTE — Patient Instructions (Signed)
COPD with asthma: Keep taking Trelegy 1 puff daily I agree completely with the plan to change out the bedding and mattress in your house Stay active, exercise regularly, consider purchasing the X5X booklet from the [employer] describing an exercise routine  We will see you back in 4-6 months or sooner if needed

## 2017-02-23 NOTE — Telephone Encounter (Signed)
Called and spoke to pt's wife, Santiago Glad. She is returning a call to schedule her CT sinus. Please see pt's chart for more information.  Will sign off.

## 2017-02-24 ENCOUNTER — Encounter: Payer: Self-pay | Admitting: Internal Medicine

## 2017-02-24 ENCOUNTER — Encounter: Payer: Self-pay | Admitting: Family Medicine

## 2017-02-24 MED ORDER — VENLAFAXINE HCL ER 75 MG PO CP24: 75.0000 mg | ORAL_CAPSULE | Freq: Every day | ORAL | 3 refills | Status: DC

## 2017-02-24 MED ORDER — METFORMIN HCL 500 MG PO TABS: 500.0000 mg | ORAL_TABLET | Freq: Two times a day (BID) | ORAL | 0 refills | Status: DC

## 2017-02-24 NOTE — Telephone Encounter (Signed)
Sent 90 day on the metformin, but the duloxetine is not on pt med list. pls advise if ok to send...Johny Chess

## 2017-02-24 NOTE — Telephone Encounter (Signed)
No - he is just taking the effexor now - should not be taking both.

## 2017-03-03 ENCOUNTER — Ambulatory Visit (INDEPENDENT_AMBULATORY_CARE_PROVIDER_SITE_OTHER): Payer: Medicare Other | Admitting: Pharmacist Clinician (PhC)/ Clinical Pharmacy Specialist

## 2017-03-03 DIAGNOSIS — Z7901 Long term (current) use of anticoagulants: Secondary | ICD-10-CM

## 2017-03-03 DIAGNOSIS — I480 Paroxysmal atrial fibrillation: Secondary | ICD-10-CM | POA: Diagnosis not present

## 2017-03-03 DIAGNOSIS — I4891 Unspecified atrial fibrillation: Secondary | ICD-10-CM

## 2017-03-03 DIAGNOSIS — Z86718 Personal history of other venous thrombosis and embolism: Secondary | ICD-10-CM | POA: Diagnosis not present

## 2017-03-03 LAB — POCT INR: INR: 1.9

## 2017-03-03 NOTE — Patient Instructions (Signed)
Description   Increase dose to 1.5 tablets daily except 2 tablets each Monday, Wednesday and Friday.  Repeat INR in 3 weeks

## 2017-03-04 ENCOUNTER — Ambulatory Visit: Payer: Medicare Other | Admitting: Family Medicine

## 2017-03-16 ENCOUNTER — Encounter: Payer: Self-pay | Admitting: Pulmonary Disease

## 2017-03-16 ENCOUNTER — Ambulatory Visit (INDEPENDENT_AMBULATORY_CARE_PROVIDER_SITE_OTHER): Payer: Medicare Other | Admitting: Pulmonary Disease

## 2017-03-16 VITALS — BP 128/76 | HR 91 | Ht 74.0 in | Wt 264.0 lb

## 2017-03-16 DIAGNOSIS — Z789 Other specified health status: Secondary | ICD-10-CM | POA: Diagnosis not present

## 2017-03-16 DIAGNOSIS — Z9989 Dependence on other enabling machines and devices: Secondary | ICD-10-CM | POA: Diagnosis not present

## 2017-03-16 DIAGNOSIS — G4733 Obstructive sleep apnea (adult) (pediatric): Secondary | ICD-10-CM | POA: Diagnosis not present

## 2017-03-16 NOTE — Progress Notes (Signed)
[facility]  Chief Complaint  Patient presents with  . Follow-up    Pt doing well using cpap machine, the machine is making loud noise in last few weeks.    Vital signs: BP 128/76 (BP Location: Left Arm, Cuff Size: Normal)   Pulse 91   Ht 6\' 2"  (1.88 m)   Wt 264 lb (119.7 kg)   SpO2 94%   BMI 33.90 kg/m   History of Present Illness: [name] [name] is a 72 y.o. male with OSA and COPD.  I last saw him in 2017.  He is being followed by Dr. Lake Bells for his COPD.  He uses CPAP nightly.  His main issue is related to air leak from his mask when he rolls on his side.  His mask will also make noise at times.  He has been using a full face mask.  He hasn't tried any other mask types.  Physical Exam:  General - pleasant Eyes - pupils reactive ENT - no sinus tenderness, no oral exudate, no LAN Cardiac - regular, no murmur Chest - no wheeze, rales Abd - soft, non tender Ext - no edema Skin - no rashes Neuro - normal strength Psych - normal mood   Assessment/Plan:  Obstructive sleep apnea. - he is compliant with CPAP and reports benefit from therapy - continue auto CPAP  Difficulty with full face mask. - will have his DME refit his mask - he might need a chin strap if he gets a smaller fitting mask  COPD. - he will continue to follow Dr. Lake Bells for this   Patient Instructions  Will arrange for CPAP mask refitting  Follow up in 1 year    Chesley Mires, MD [place] 03/16/2017, 12:39 PM Pager:  [phone]  Flow Sheet   Sleep tests HST 11/13/15 >> AHI 47.2, SaO2 low 70%. Auto CPAP 02/14/17 to 03/15/17 >> used on 30 of 30 nights with average 8 hrs 51 min.  Average AHI 0.3 with median CPAP 8 and 95 th percentile CPAP 10 cm H2O  Pulmonary tests: PFT 07/25/13 >> FEV1 2.94 (77%), FEV1% 69, TLC 6.38 (81%), DLCO 46% V/Q scan 08/02/13 >> low probability for PE Spirometry 08/21/14 >> FEV1 2.65 (68%), FEV1%  69 CT chest 08/29/14 >> atherosclerosis, mild paraseptal emphysema PFT 01/11/17 >> FEV1 2.81 (76%), FEV1% 63, TLC 7.79 (99%), DLCO 56%  Cardiac tests Echo 07/05/13 >> EF 60 to 65%  Events:  Past Medical History: He  has a past medical history of Arthritis, Asthma, BPH (benign prostatic hypertrophy), CAD (coronary artery disease), Complication of anesthesia, COPD (chronic obstructive pulmonary disease) ([place]), Diverticulosis, DVT (deep venous thrombosis) ([place]), Dyspnea on exertion, GERD (gastroesophageal reflux disease), History of gout, History of hiatal hernia, echocardiogram (2015), Hypertension, Macrocytic anemia, Obesity, OSA (obstructive sleep apnea) (11/14/2015), PAF (paroxysmal atrial fibrillation) ([place]), Pneumonia (~ 2010 X 1), Pulmonary embolism ([place]), Spinal stenosis, Transaminitis, and Ulcerative proctitis ([place]) (08/25/2011).  Past Surgical History: He  has a past surgical history that includes Vasectomy; Lumbar fusion (02/2008); Total hip arthroplasty (Bilateral); Cataract extraction w/ intraocular lens  implant, bilateral (Bilateral); Colonoscopy w/ polypectomy (2004); Joint replacement; Anterior lumbar disc arthroplasty (03/2008); Coronary angioplasty with stent (08/08/2013); left and right heart catheterization with coronary angiogram (N/A, 08/07/2013); percutaneous coronary stent intervention (pci-s) (08/07/2013); Colonoscopy with polypectomy (09/2011); and PVI ablation (11/2014).  Family History: His family history includes Alcohol abuse (age of onset: 5) in his brother; Atrial fibrillation in his mother; COPD in his  mother; Colon cancer in his father; Heart attack in his paternal grandfather and paternal uncle; Heart attack (age of onset: 37) in his brother; Heart disease in his father.  Social History: He  reports that he quit smoking about 17 years ago. His smoking use included cigarettes. He has a 80.00 pack-year smoking history. he has never used smokeless tobacco. He reports that he  drinks alcohol. He reports that he does not use drugs.  Medications: Allergies as of 03/16/2017      Reactions   Penicillins Anaphylaxis    Because of a history of documented adverse serious drug reaction;Medi Alert bracelet  is recommended      Medication List        Accurate as of 03/16/17 12:39 PM. Always use your most recent med list.          amLODipine 5 MG tablet Commonly known as:  NORVASC Take 1 tablet (5 mg total) by mouth daily. -- Office visit needed for further refills   aspirin 81 MG chewable tablet Chew 1 tablet by mouth daily.   atorvastatin 10 MG tablet Commonly known as:  LIPITOR TAKE 1 TABLET EVERY EVENING   Fluticasone-Umeclidin-Vilant 100-62.5-25 MCG/INH Aepb Commonly known as:  TRELEGY ELLIPTA Inhale 1 puff daily into the lungs.   gabapentin 300 MG capsule Commonly known as:  NEURONTIN Take 1 capsule (300 mg total) by mouth at bedtime.   metFORMIN 500 MG tablet Commonly known as:  GLUCOPHAGE Take 1 tablet (500 mg total) by mouth 2 (two) times daily with a meal. Keep scheduled appt for future refills   methocarbamol 500 MG tablet Commonly known as:  ROBAXIN Take 500 mg daily as needed by mouth for muscle spasms.   nitroGLYCERIN 0.4 MG SL tablet Commonly known as:  NITROSTAT Place 1 tablet (0.4 mg total) under the tongue every 5 (five) minutes as needed for chest pain (up to 3 doses).   pantoprazole 40 MG tablet Commonly known as:  PROTONIX TAKE 1 TABLET DAILY   venlafaxine XR 75 MG 24 hr capsule Commonly known as:  EFFEXOR XR Take 1 capsule (75 mg total) by mouth daily with breakfast.   vitamin B-12 1000 MCG tablet Commonly known as:  CYANOCOBALAMIN Take 1,000 mcg by mouth daily.   Vitamin D (Ergocalciferol) 50000 units Caps capsule Commonly known as:  DRISDOL   warfarin 3 MG tablet Commonly known as:  COUMADIN Take as directed by the anticoagulation clinic. If you are unsure how to take this medication, talk to your nurse or  doctor. Original instructions:  TAKE ONE AND ONE-HALF TO TWO TABLETS DAILY AS DIRECTED BY COUMADIN CLINIC

## 2017-03-16 NOTE — Patient Instructions (Signed)
Will arrange for CPAP mask refitting  Follow up in 1 year 

## 2017-03-17 DIAGNOSIS — M1712 Unilateral primary osteoarthritis, left knee: Secondary | ICD-10-CM | POA: Diagnosis not present

## 2017-03-17 DIAGNOSIS — M25562 Pain in left knee: Secondary | ICD-10-CM | POA: Diagnosis not present

## 2017-03-17 DIAGNOSIS — S39012D Strain of muscle, fascia and tendon of lower back, subsequent encounter: Secondary | ICD-10-CM | POA: Diagnosis not present

## 2017-03-17 DIAGNOSIS — M545 Low back pain: Secondary | ICD-10-CM | POA: Diagnosis not present

## 2017-03-22 ENCOUNTER — Encounter: Payer: Self-pay | Admitting: Family Medicine

## 2017-03-22 ENCOUNTER — Ambulatory Visit (INDEPENDENT_AMBULATORY_CARE_PROVIDER_SITE_OTHER): Payer: Medicare Other | Admitting: Family Medicine

## 2017-03-22 DIAGNOSIS — M48062 Spinal stenosis, lumbar region with neurogenic claudication: Secondary | ICD-10-CM | POA: Diagnosis not present

## 2017-03-22 MED ORDER — GABAPENTIN 300 MG PO CAPS: 600.0000 mg | ORAL_CAPSULE | Freq: Every day | ORAL | 3 refills | Status: DC

## 2017-03-22 MED ORDER — TRAZODONE HCL 50 MG PO TABS: 25.0000 mg | ORAL_TABLET | Freq: Every evening | ORAL | 3 refills | Status: DC | PRN

## 2017-03-22 NOTE — Progress Notes (Signed)
[name] [address], [place] [address] Phone: [phone] Subjective:     CC: Back pain and leg pain follow-up  YIR:[id number]  [name] [name] is a 72 y.o. male coming in with complaint of low leg pain.  Been left leg.  Seem to be more radicular symptoms.  Has a history of spinal stenosis at L3-L4 after a fusion at L2 through L5.  Patient has an EMG showing continued radicular symptoms.  The patient continues to have pain only at night.  Feels the gabapentin helped out a little bit but does not completely resolve it.  Continues to wake up on most mornings just fine.  Denies though any weakness.  States that during the day seems to do relatively well.     Past Medical History:  Diagnosis Date  . Arthritis   . Asthma   . BPH (benign prostatic hypertrophy)   . CAD (coronary artery disease)    a. 07/2013: s/p DES to LAD, normal LVF.  [name] Complication of anesthesia    "I got all kinds of hallucinations"  . COPD (chronic obstructive pulmonary disease) ([place])    a. 07/2013 PFT's mild airflow obstruction, no restriction, sev decrease in DLCO.  . Diverticulosis   . DVT (deep venous thrombosis) ([place])    a. 2010 Lower ext s/p back surgery.  [name] Dyspnea on exertion    a. 07/2013 PFT's mild airflow obstr   . GERD (gastroesophageal reflux disease)   . History of gout   . History of hiatal hernia   . Hx of echocardiogram 2015   Echo (06/2013): EF 60-65% normal wall motion, normal diastolic function, aortic sclerosis without stenosis, Trivial MR, mild SAM due to long, redundant mitral leaflets, mild RAE, normal RVSF  . Hypertension   . Macrocytic anemia    a. 07/2013: documented on prior labs.  . Obesity   . OSA (obstructive sleep apnea) 11/14/2015  . PAF (paroxysmal atrial fibrillation) ([place])    a. Flecainide discontinued 07/2013 in setting of CAD.; b. s/p PVI Ablation at [facility] (Dr Joseph Berkshire) 11/2014  . Pneumonia ~ 2010 X 1  . Pulmonary embolism ([place])     a. 2010 in setting of DVT post-op back surgery. b. Low prob VQ 07/2013.  [name] Spinal stenosis    Congential  . Transaminitis    a. 07/2013: mild.  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 ANTERIOR LUMBAR [place] ARTHROPLASTY  03/2008   "spacer poped out; had to repair"  . CATARACT EXTRACTION W/ INTRAOCULAR LENS  IMPLANT, BILATERAL Bilateral   . COLONOSCOPY W/ POLYPECTOMY  2004  . Colonoscopy with polypectomy  09/2011   2 tubular adenomas  . CORONARY ANGIOPLASTY WITH STENT PLACEMENT  08/08/2013   "1"  . JOINT REPLACEMENT    . LEFT AND RIGHT HEART CATHETERIZATION WITH CORONARY ANGIOGRAM N/A 08/07/2013   Procedure: LEFT AND RIGHT HEART CATHETERIZATION WITH CORONARY ANGIOGRAM;  Surgeon: Peter M Martinique, MD;  Location: [facility] CATH LAB;  Service: Cardiovascular;  Laterality: N/A;  . LUMBAR FUSION  02/2008  . PERCUTANEOUS CORONARY STENT INTERVENTION (PCI-S)  08/07/2013   Procedure: PERCUTANEOUS CORONARY STENT INTERVENTION (PCI-S);  Surgeon: Peter M Martinique, MD;  Location: [facility] CATH LAB;  Service: Cardiovascular;;  . PVI ablation  11/2014   Dr. Venita [facility]  . TOTAL HIP ARTHROPLASTY Bilateral   . VASECTOMY     Social History   Socioeconomic History  .  Marital status: Married    Spouse name: None  . Number of children: 4  . Years of education: None  . Highest education level: None  Social Needs  . Financial resource strain: None  . Food insecurity - worry: None  . Food insecurity - inability: None  . Transportation needs - medical: None  . Transportation needs - non-medical: None  Occupational History  . Occupation: Retired from [employer]    Employer: RETIRED  Tobacco Use  . Smoking status: Former Smoker    Packs/day: 2.00    Years: 40.00    Pack years: 80.00    Types: Cigarettes    Last attempt to quit: 03/09/2000    Years since quitting: 17.0  . Smokeless tobacco: Never Used  . Tobacco comment: smoked 1964-2002, up to 3 ppd    Substance and Sexual Activity  . Alcohol use: Yes    Alcohol/week: 0.0 oz    Comment: 1-2 beers every 2 weeks (10/30/15) /// 02/08/2014 "drink a couple times/month"  . Drug use: No  . Sexual activity: None    Comment: "sexually hx is none of your business" (02/08/2014)  Other Topics Concern  . None  Social History Narrative  . None   Allergies  Allergen Reactions  . Penicillins Anaphylaxis     Because of a history of documented adverse serious drug reaction;Medi Alert bracelet  is recommended   Family History  Problem Relation Age of Onset  . Heart attack Paternal Grandfather        Over 89  . Atrial fibrillation Mother   . COPD Mother   . Heart disease Father   . Colon cancer Father   . Heart attack Brother 3  . Alcohol abuse Brother 90       Died post liver transplant  . Heart attack Paternal Uncle        Less than 37  . Stroke Neg Hx      Past medical history, social, surgical and family history all reviewed in electronic medical record.  No pertanent information unless stated regarding to the chief complaint.   Review of Systems:Review of systems updated and as accurate as of 03/22/17  No headache, visual changes, nausea, vomiting, diarrhea, constipation, dizziness, abdominal pain, skin rash, fevers, chills, night sweats, weight loss, swollen lymph nodes, body aches, joint swelling, chest pain, shortness of breath, mood changes.  Positive muscle aches  Objective  Blood pressure 130/90, pulse 80, height 6\' 2"  (1.88 m), weight 265 lb (120.2 kg), SpO2 97 %. Systems examined below as of 03/22/17   General: No apparent distress alert and oriented x3 mood and affect normal, dressed appropriately.  HEENT: Pupils equal, extraocular movements intact  Respiratory: Patient's speak in full sentences and does not appear short of breath  Cardiovascular: No lower extremity edema, non tender, no erythema  Skin: Warm dry intact with no signs of infection or rash on extremities or on  axial skeleton.  Abdomen: Soft nontender  Neuro: Cranial nerves II through XII are intact, neurovascularly intact in all extremities with 2+ DTRs and 2+ pulses.  Lymph: No lymphadenopathy of posterior or anterior cervical chain or axillae bilaterally.  Gait normal with good balance and coordination.  MSK:  Non tender with full range of motion and good stability and symmetric strength and tone of shoulders, elbows, wrist, hip, knee and ankles bilaterally.  Back exam still shows decreased range of motion in all planes.  Positive Faber on the left.  Significant tightness with straight leg  test but no radicular symptoms.  Tender to palpation in the paraspinal musculature of the lumbar spine.  Patient's left leg is unremarkable.  Full range of motion.  Deep tendon reflexes and pulses intact.    Impression and Recommendations:     This case required medical decision making of moderate complexity.      Note: This dictation was prepared with Dragon dictation along with smaller phrase technology. Any transcriptional errors that result from this process are unintentional.

## 2017-03-22 NOTE — Patient Instructions (Signed)
Good to see you  Gabapentin 600mg  at night Continue the effexor  Trazadone 1/2 tab at night if the gabapentin is not helping See me again In 4 weeks

## 2017-03-22 NOTE — Assessment & Plan Note (Signed)
I believe the patient does have spinal stenosis.  We discussed with patient in great length about the possibility of epidurals.  Patient declined this.  Continuing the Effexor at this dose.  Because of the pain is still at night given his low dose of trazodone to potentially try.  Increase gabapentin to 600 mg at night because it does seem to be more beneficial with no significant side effects in the individual.  Follow-up again in 4 weeks

## 2017-03-24 ENCOUNTER — Ambulatory Visit (INDEPENDENT_AMBULATORY_CARE_PROVIDER_SITE_OTHER): Payer: Medicare Other | Admitting: Pharmacist

## 2017-03-24 DIAGNOSIS — Z86718 Personal history of other venous thrombosis and embolism: Secondary | ICD-10-CM | POA: Diagnosis not present

## 2017-03-24 DIAGNOSIS — Z7901 Long term (current) use of anticoagulants: Secondary | ICD-10-CM | POA: Diagnosis not present

## 2017-03-24 DIAGNOSIS — I4891 Unspecified atrial fibrillation: Secondary | ICD-10-CM | POA: Diagnosis not present

## 2017-03-24 DIAGNOSIS — I480 Paroxysmal atrial fibrillation: Secondary | ICD-10-CM

## 2017-03-24 LAB — POCT INR: INR: 1.6

## 2017-03-25 NOTE — Progress Notes (Addendum)
Subjective:   [name] [name] is a 72 y.o. male who presents for Medicare Annual/Subsequent preventive examination.  Review of Systems:  No ROS.  Medicare Wellness Visit. Additional risk factors are reflected in the social history.  Cardiac Risk Factors include: advanced age (>33men, >59 women) Sleep patterns: does not get up to void, gets up 1-2 times nightly to void and sleeps 6-8 hours nightly.    Home Safety/Smoke Alarms: Feels safe in home. Smoke alarms in place.  Living environment; residence and Firearm Safety: 1-story house/ trailer, no firearms. Lives with wife, no needs for DME, good support system Seat Belt Safety/Bike Helmet: Wears seat belt.     Objective:    Vitals: There were no vitals taken for this visit.  There is no height or weight on file to calculate BMI.  Advanced Directives 03/26/2017 01/02/2016 06/17/2015 05/21/2014 04/21/2014 02/08/2014 08/08/2013  Does Patient Have a Medical Advance Directive? Yes Yes Yes Yes Yes No -  Type of Paramedic of [place];Living will [place];Living will [place];Living will [place];Living will [place];Living will - -  Does patient want to make changes to medical advance directive? - No - Patient declined No - Patient declined - - - -  Copy of [place] in Chart? No - copy requested - No - copy requested No - copy requested - - -  Would patient like information on creating a medical advance directive? - - - - - No - patient declined information -  Pre-existing out of facility DNR order (yellow form or pink MOST form) - - - - - - (No Data)    Tobacco Social History   Tobacco Use  Smoking Status Former Smoker  . Packs/day: 2.00  . Years: 40.00  . Pack years: 80.00  . Types: Cigarettes  . Last attempt to quit: 03/09/2000  . Years since quitting: 17.0  Smokeless Tobacco Never Used  Tobacco Comment   smoked  1964-2002, up to 3 ppd     Counseling given: Not Answered Comment: smoked 1964-2002, up to 3 ppd   Past Medical History:  Diagnosis Date  . Arthritis   . Asthma   . BPH (benign prostatic hypertrophy)   . CAD (coronary artery disease)    a. 07/2013: s/p DES to LAD, normal LVF.  [name] Complication of anesthesia    "I got all kinds of hallucinations"  . COPD (chronic obstructive pulmonary disease) ([place])    a. 07/2013 PFT's mild airflow obstruction, no restriction, sev decrease in DLCO.  . Diverticulosis   . DVT (deep venous thrombosis) ([place])    a. 2010 Lower ext s/p back surgery.  [name] Dyspnea on exertion    a. 07/2013 PFT's mild airflow obstr   . GERD (gastroesophageal reflux disease)   . History of gout   . History of hiatal hernia   . Hx of echocardiogram 2015   Echo (06/2013): EF 60-65% normal wall motion, normal diastolic function, aortic sclerosis without stenosis, Trivial MR, mild SAM due to long, redundant mitral leaflets, mild RAE, normal RVSF  . Hypertension   . Macrocytic anemia    a. 07/2013: documented on prior labs.  . Obesity   . OSA (obstructive sleep apnea) 11/14/2015  . PAF (paroxysmal atrial fibrillation) ([place])    a. Flecainide discontinued 07/2013 in setting of CAD.; b. s/p PVI Ablation at [facility] (Dr Joseph Berkshire) 11/2014  . Pneumonia ~ 2010 X 1  .  Pulmonary embolism ([place])    a. 2010 in setting of DVT post-op back surgery. b. Low prob VQ 07/2013.  [name] Spinal stenosis    Congential  . Transaminitis    a. 07/2013: mild.  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 ANTERIOR LUMBAR [place] ARTHROPLASTY  03/2008   "spacer poped out; had to repair"  . CATARACT EXTRACTION W/ INTRAOCULAR LENS  IMPLANT, BILATERAL Bilateral   . COLONOSCOPY W/ POLYPECTOMY  2004  . Colonoscopy with polypectomy  09/2011   2 tubular adenomas  . CORONARY ANGIOPLASTY WITH STENT PLACEMENT  08/08/2013   "1"  . JOINT REPLACEMENT    . LEFT AND RIGHT HEART  CATHETERIZATION WITH CORONARY ANGIOGRAM N/A 08/07/2013   Procedure: LEFT AND RIGHT HEART CATHETERIZATION WITH CORONARY ANGIOGRAM;  Surgeon: Peter M Martinique, MD;  Location: [facility] CATH LAB;  Service: Cardiovascular;  Laterality: N/A;  . LUMBAR FUSION  02/2008  . PERCUTANEOUS CORONARY STENT INTERVENTION (PCI-S)  08/07/2013   Procedure: PERCUTANEOUS CORONARY STENT INTERVENTION (PCI-S);  Surgeon: Peter M Martinique, MD;  Location: [facility] CATH LAB;  Service: Cardiovascular;;  . PVI ablation  11/2014   Dr. Venita Sheffield [facility]  . TOTAL HIP ARTHROPLASTY Bilateral   . VASECTOMY     Family History  Problem Relation Age of Onset  . Heart attack Paternal Grandfather        Over 43  . Atrial fibrillation Mother   . COPD Mother   . Heart disease Father   . Colon cancer Father   . Heart attack Brother 84  . Alcohol abuse Brother 57       Died post liver transplant  . Heart attack Paternal Uncle        Less than 83  . Stroke Neg Hx    Social History   Socioeconomic History  . Marital status: Married    Spouse name: None  . Number of children: 4  . Years of education: None  . Highest education level: None  Social Needs  . Financial resource strain: Not hard at all  . Food insecurity - worry: Never true  . Food insecurity - inability: Never true  . Transportation needs - medical: No  . Transportation needs - non-medical: No  Occupational History  . Occupation: Retired from [employer]    Employer: RETIRED  Tobacco Use  . Smoking status: Former Smoker    Packs/day: 2.00    Years: 40.00    Pack years: 80.00    Types: Cigarettes    Last attempt to quit: 03/09/2000    Years since quitting: 17.0  . Smokeless tobacco: Never Used  . Tobacco comment: smoked 1964-2002, up to 3 ppd  Substance and Sexual Activity  . Alcohol use: Yes    Alcohol/week: 1.2 - 1.8 oz    Types: 2 - 3 Cans of beer per week    Comment: drinks beer several times weekly  . Drug use: No  . Sexual activity: No    Comment:  "sexually hx is none of your business" (02/08/2014)  Other Topics Concern  . None  Social History Narrative  . None    Outpatient Encounter Medications as of 03/26/2017  Medication Sig  . amLODipine (NORVASC) 5 MG tablet Take 1 tablet (5 mg total) by mouth daily. -- Office visit needed for further refills  . aspirin 81 MG chewable tablet Chew 1 tablet by mouth daily.  [name] atorvastatin (LIPITOR) 10 MG tablet TAKE 1  TABLET EVERY EVENING  . Fluticasone-Umeclidin-Vilant (TRELEGY ELLIPTA) 100-62.5-25 MCG/INH AEPB Inhale 1 puff daily into the lungs.  . gabapentin (NEURONTIN) 300 MG capsule Take 2 capsules (600 mg total) by mouth at bedtime.  [name] HYDROcodone-acetaminophen (NORCO/VICODIN) 5-325 MG tablet Take 1 tablet by mouth every 6 (six) hours as needed for moderate pain.  . metFORMIN (GLUCOPHAGE) 500 MG tablet Take 1 tablet (500 mg total) by mouth 2 (two) times daily with a meal. Keep scheduled appt for future refills  . methocarbamol (ROBAXIN) 500 MG tablet Take 500 mg daily as needed by mouth for muscle spasms.  . nitroGLYCERIN (NITROSTAT) 0.4 MG SL tablet Place 1 tablet (0.4 mg total) under the tongue every 5 (five) minutes as needed for chest pain (up to 3 doses).  . pantoprazole (PROTONIX) 40 MG tablet TAKE 1 TABLET DAILY  . traZODone (DESYREL) 50 MG tablet Take 0.5-1 tablets (25-50 mg total) by mouth at bedtime as needed for sleep.  [name] venlafaxine XR (EFFEXOR XR) 75 MG 24 hr capsule Take 1 capsule (75 mg total) by mouth daily with breakfast.  . vitamin B-12 (CYANOCOBALAMIN) 1000 MCG tablet Take 1,000 mcg by mouth daily.  . Vitamin D, Ergocalciferol, (DRISDOL) 50000 units CAPS capsule   . warfarin (COUMADIN) 3 MG tablet TAKE ONE AND ONE-HALF TO TWO TABLETS DAILY AS DIRECTED BY COUMADIN CLINIC   No facility-administered encounter medications on file as of 03/26/2017.     Activities of Daily Living In your present state of health, do you have any difficulty performing the following activities:  03/26/2017  Hearing? N  Vision? N  Difficulty concentrating or making decisions? N  Walking or climbing stairs? Y  Dressing or bathing? N  Doing errands, shopping? N  Preparing Food and eating ? N  Using the Toilet? N  In the past six months, have you accidently leaked urine? N  Do you have problems with loss of bowel control? N  Managing your Medications? N  Managing your Finances? N  Housekeeping or managing your Housekeeping? N  Some recent data might be hidden    Patient Care Team: Binnie Rail, MD as PCP - General (Internal Medicine) Minus Breeding, MD as Consulting Physician (Cardiology) Venita Sheffield, [facility] as Consulting Physician (Clinical Cardiac Electrophysiology)   Assessment:   This is a routine wellness examination for [name] [name]. Physical assessment deferred to PCP.   Exercise Activities and Dietary recommendations Current Exercise Habits: Home exercise routine, Type of exercise: walking, Time (Minutes): 30, Frequency (Times/Week): 2, Weekly Exercise (Minutes/Week): 60, Intensity: Mild, Exercise limited by: orthopedic condition(s)  Diet (meal preparation, eat out, water intake, caffeinated beverages, dairy products, fruits and vegetables): in general, a "healthy" diet  , well balanced   Reviewed heart healthy and diabetic diet, encouraged patient to increase daily water intake. Diet education was provided via handout. Discussed weight loss strategies    Goals    . Patient Stated     Loose weight, keep junk out of the house, increase physical activity.        Fall Risk Fall Risk  03/26/2017 12/25/2016 12/25/2016 11/08/2015 10/24/2015  Falls in the past year? No No No No No  Comment - - - Emmi Telephone Survey: data to providers prior to load -  Number falls in past yr: - - - - -  Injury with Fall? - - - - -  Comment - - - - -  Follow up - - - - -    Depression Screen PHQ  2/9 Scores 03/26/2017 12/25/2016 10/24/2015 04/24/2014  PHQ - 2 Score 0 0 0 0   PHQ- 9 Score 0 3 - -  Exception Documentation - - - Patient refusal    Cognitive Function       Ad8 score reviewed for issues:  Issues making decisions: no  Less interest in hobbies / activities: no  Repeats questions, stories (family complaining): no  Trouble using ordinary gadgets (microwave, computer, phone):no  Forgets the month or year: no  Mismanaging finances: no  Remembering appts: no  Daily problems with thinking and/or memory: no Ad8 score is= 0, patient denies daily issues with memory but states at times he forgets names and/or why he went into a room.   Immunization History  Administered Date(s) Administered  . Influenza Split 12/24/2010, 02/09/2012  . Influenza Whole 12/24/2009  . Influenza, High Dose Seasonal PF 01/17/2013, 12/14/2016  . Influenza,inj,Quad PF,6+ Mos 01/04/2014, 11/14/2015  . Influenza-Unspecified 11/08/2014  . Pneumococcal Conjugate-13 11/08/2014  . Pneumococcal Polysaccharide-23 08/10/2008, 04/08/2009, 11/14/2014, 01/23/2016  . Td 04/25/2007  . Zoster 01/25/2015   Screening Tests Health Maintenance  Topic Date Due  . TETANUS/TDAP  04/24/2017  . COLONOSCOPY  06/25/2018  . INFLUENZA VACCINE  Completed  . Hepatitis C Screening  Completed  . PNA vac Low Risk Adult  Completed      Plan:    Continue doing brain stimulating activities (puzzles, reading, adult coloring books, staying active) to keep memory sharp.   Continue to eat heart healthy diet (full of fruits, vegetables, whole grains, lean protein, water--limit salt, fat, and sugar intake) and increase physical activity as tolerated.  I have personally reviewed and noted the following in the patient's chart:   . Medical and social history . Use of alcohol, tobacco or illicit drugs  . Current medications and supplements . Functional ability and status . Nutritional status . Physical activity . Advanced directives . List of other physicians . Vitals . Screenings to  include cognitive, depression, and falls . Referrals and appointments  In addition, I have reviewed and discussed with patient certain preventive protocols, quality metrics, and best practice recommendations. A written personalized care plan for preventive services as well as general preventive health recommendations were provided to patient.     Michiel Cowboy, RN  03/26/2017  Medical screening examination/treatment/procedure(s) were performed by non-physician practitioner and as supervising physician I was immediately available for consultation/collaboration. I agree with above. Binnie Rail, MD

## 2017-03-25 NOTE — Patient Instructions (Addendum)
  Medications reviewed and updated.  No changes recommended at this time.     Please followup in 6 months   

## 2017-03-25 NOTE — Progress Notes (Signed)
Subjective:    Patient ID: [name] [name], male    DOB: August 28, 1945, 72 y.o.   MRN: [id number]  HPI The patient is here for follow up.  He has no concerns.  Overall he feels about the same.  He does continue to experience chronic lower back and hip pain.  He continues to experience fatigue.  His wife did call earlier today about his shortness of breath, which she is concerned about.  Shortness of breath is not new and has been evaluated by cardiology, pulmonary and myself in the past.  He denies any recent changes in his shortness of breath.  CAD, Hypertension: He is taking his medication daily. He is compliant with a low sodium diet.  He denies chest pain, palpitations, edema, and regular headaches. He is not exercising regularly.      Hyperlipidemia: He is taking his medication daily. He is compliant with a low fat/cholesterol diet. He is not exercising regularly. He denies myalgias.   GERD:  He is taking his medication daily as prescribed.  He denies any GERD symptoms and feels his GERD is well controlled.   Sleep disorder:  He is taking trazodone some nights only.  if he naps only 20-30 minutes he can sleep at night and therefore does not need to take medication.  If he naps 2 hrs and then will not be able to sleep most of the nights he will take the medication.  He tries to time his naps that he does not sleep too long.      Prediabetes:  He is taking metformin daily.  He is fairly compliant with a low sugar/carbohydrate diet.  He is not exercising regularly.  Chronic back pain from spinal stenosis: He is taking venlafaxine for his pain.  He is taking gabapentin.  He is also following with Dr. Tamala Julian.  He is taking all of his supplementation.   Medications and allergies reviewed with patient and updated if appropriate.  Patient Active Problem List   Diagnosis Date Noted  . Bilateral leg edema 12/26/2016  . Anxiety 12/26/2016  . Scar tissue 11/02/2016  . Plantar fasciitis  10/06/2016  . 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 Vitamin D deficiency 07/21/2016  . OSA (obstructive sleep apnea) 11/14/2015  . Pneumonia 10/24/2015  . Prediabetes 04/17/2015  . COPD GOLD II 08/21/2014  . Hyperlipidemia 08/20/2014  . Spinal stenosis of lumbar region 05/30/2014  . Abnormal CT of the abdomen 05/30/2014  . Dizziness 02/08/2014  . Macrocytic anemia 08/08/2013  . CAD (coronary artery disease)   . AAA (abdominal aortic aneurysm) without rupture ([place]) 05/02/2013  . Obesity (BMI 30-39.9) 03/21/2013  . Family history of malignant neoplasm of gastrointestinal tract 08/25/2011  . 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 Fatigue 03/24/2011  . DOE (dyspnea on exertion) 03/12/2011  . Warfarin anticoagulation 03/11/2011  . INSOMNIA-SLEEP DISORDER-UNSPEC 09/12/2009  . CERVICAL RADICULOPATHY, RIGHT 07/24/2009  . DEGENERATIVE JOINT DISEASE, ADVANCED 05/21/2009  . AVASCULAR NECROSIS 05/21/2009  . ALLERGIC RHINITIS CAUSE UNSPECIFIED 11/01/2008  . PULMONARY NODULE, RIGHT LOWER LOBE 10/25/2008  . DIVERTICULOSIS, COLON 10/07/2008  . 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 PULMONARY EMBOLISM, HX OF 10/05/2008  . ASTHMA 02/20/2008  . OTHER AND UNSPECIFIED HYPERLIPIDEMIA 09/08/2007  . HYPERPLASIA PROSTATE UNS W/UR OBST & OTH LUTS 06/28/2007  . PAF (paroxysmal atrial fibrillation) ([place]) 06/25/2007  . NOCTURIA 05/12/2007  . COLONIC POLYPS 04/01/2006  . GOUT 04/01/2006  . Essential hypertension 04/01/2006  . ACID REFLUX DISEASE 04/01/2006  Current Outpatient Medications on File Prior to Visit  Medication Sig Dispense Refill  . amLODipine (NORVASC) 5 MG tablet Take 1 tablet (5 mg total) by mouth daily. -- Office visit needed for further refills 90 tablet 0  . aspirin 81 MG chewable tablet Chew 1 tablet by mouth daily.    [name] atorvastatin (LIPITOR) 10 MG tablet TAKE 1 TABLET EVERY EVENING 90 tablet 3  . Fluticasone-Umeclidin-Vilant (TRELEGY ELLIPTA) 100-62.5-25  MCG/INH AEPB Inhale 1 puff daily into the lungs. 1 each 0  . gabapentin (NEURONTIN) 300 MG capsule Take 2 capsules (600 mg total) by mouth at bedtime. 180 capsule 3  . metFORMIN (GLUCOPHAGE) 500 MG tablet Take 1 tablet (500 mg total) by mouth 2 (two) times daily with a meal. Keep scheduled appt for future refills 180 tablet 0  . methocarbamol (ROBAXIN) 500 MG tablet Take 500 mg daily as needed by mouth for muscle spasms.    . nitroGLYCERIN (NITROSTAT) 0.4 MG SL tablet Place 1 tablet (0.4 mg total) under the tongue every 5 (five) minutes as needed for chest pain (up to 3 doses). 25 tablet 4  . pantoprazole (PROTONIX) 40 MG tablet TAKE 1 TABLET DAILY 90 tablet 3  . traZODone (DESYREL) 50 MG tablet Take 0.5-1 tablets (25-50 mg total) by mouth at bedtime as needed for sleep. 30 tablet 3  . venlafaxine XR (EFFEXOR XR) 75 MG 24 hr capsule Take 1 capsule (75 mg total) by mouth daily with breakfast. 90 capsule 3  . vitamin B-12 (CYANOCOBALAMIN) 1000 MCG tablet Take 1,000 mcg by mouth daily.    . Vitamin D, Ergocalciferol, (DRISDOL) 50000 units CAPS capsule     . warfarin (COUMADIN) 3 MG tablet TAKE ONE AND ONE-HALF TO TWO TABLETS DAILY AS DIRECTED BY COUMADIN CLINIC 180 tablet 1   No current facility-administered medications on file prior to visit.     Past Medical History:  Diagnosis Date  . Arthritis   . Asthma   . BPH (benign prostatic hypertrophy)   . CAD (coronary artery disease)    a. 07/2013: s/p DES to LAD, normal LVF.  [name] Complication of anesthesia    "I got all kinds of hallucinations"  . COPD (chronic obstructive pulmonary disease) ([place])    a. 07/2013 PFT's mild airflow obstruction, no restriction, sev decrease in DLCO.  . Diverticulosis   . DVT (deep venous thrombosis) ([place])    a. 2010 Lower ext s/p back surgery.  [name] Dyspnea on exertion    a. 07/2013 PFT's mild airflow obstr   . GERD (gastroesophageal reflux disease)   . History of gout   . History of hiatal hernia   . Hx of  echocardiogram 2015   Echo (06/2013): EF 60-65% normal wall motion, normal diastolic function, aortic sclerosis without stenosis, Trivial MR, mild SAM due to long, redundant mitral leaflets, mild RAE, normal RVSF  . Hypertension   . Macrocytic anemia    a. 07/2013: documented on prior labs.  . Obesity   . OSA (obstructive sleep apnea) 11/14/2015  . PAF (paroxysmal atrial fibrillation) ([place])    a. Flecainide discontinued 07/2013 in setting of CAD.; b. s/p PVI Ablation at [facility] (Dr Joseph Berkshire) 11/2014  . Pneumonia ~ 2010 X 1  . Pulmonary embolism ([place])    a. 2010 in setting of DVT post-op back surgery. b. Low prob VQ 07/2013.  [name] Spinal stenosis    Congential  . Transaminitis    a. 07/2013: mild.  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 ANTERIOR LUMBAR [place] ARTHROPLASTY  03/2008   "spacer poped out; had to repair"  . CATARACT EXTRACTION W/ INTRAOCULAR LENS  IMPLANT, BILATERAL Bilateral   . COLONOSCOPY W/ POLYPECTOMY  2004  . Colonoscopy with polypectomy  09/2011   2 tubular adenomas  . CORONARY ANGIOPLASTY WITH STENT PLACEMENT  08/08/2013   "1"  . JOINT REPLACEMENT    . LEFT AND RIGHT HEART CATHETERIZATION WITH CORONARY ANGIOGRAM N/A 08/07/2013   Procedure: LEFT AND RIGHT HEART CATHETERIZATION WITH CORONARY ANGIOGRAM;  Surgeon: Peter M Martinique, MD;  Location: [facility] CATH LAB;  Service: Cardiovascular;  Laterality: N/A;  . LUMBAR FUSION  02/2008  . PERCUTANEOUS CORONARY STENT INTERVENTION (PCI-S)  08/07/2013   Procedure: PERCUTANEOUS CORONARY STENT INTERVENTION (PCI-S);  Surgeon: Peter M Martinique, MD;  Location: [facility] CATH LAB;  Service: Cardiovascular;;  . PVI ablation  11/2014   Dr. Venita Sheffield [facility]  . TOTAL HIP ARTHROPLASTY Bilateral   . VASECTOMY      Social History   Socioeconomic History  . Marital status: Married    Spouse name: None  . Number of children: 4  . Years of education: None  . Highest education level:  None  Social Needs  . Financial resource strain: None  . Food insecurity - worry: None  . Food insecurity - inability: None  . Transportation needs - medical: None  . Transportation needs - non-medical: None  Occupational History  . Occupation: Retired from [employer]    Employer: RETIRED  Tobacco Use  . Smoking status: Former Smoker    Packs/day: 2.00    Years: 40.00    Pack years: 80.00    Types: Cigarettes    Last attempt to quit: 03/09/2000    Years since quitting: 17.0  . Smokeless tobacco: Never Used  . Tobacco comment: smoked 1964-2002, up to 3 ppd  Substance and Sexual Activity  . Alcohol use: Yes    Alcohol/week: 0.0 oz    Comment: 1-2 beers every 2 weeks (10/30/15) /// 02/08/2014 "drink a couple times/month"  . Drug use: No  . Sexual activity: No    Comment: "sexually hx is none of your business" (02/08/2014)  Other Topics Concern  . None  Social History Narrative  . None    Family History  Problem Relation Age of Onset  . Heart attack Paternal Grandfather        Over 66  . Atrial fibrillation Mother   . COPD Mother   . Heart disease Father   . Colon cancer Father   . Heart attack Brother 85  . Alcohol abuse Brother 63       Died post liver transplant  . Heart attack Paternal Uncle        Less than 102  . Stroke Neg Hx     Review of Systems  Constitutional: Positive for fatigue. Negative for chills and fever.  Respiratory: Positive for cough (clearing of throat), shortness of breath (with exertion) and wheezing.   Cardiovascular: Negative for chest pain, palpitations and leg swelling.  Musculoskeletal: Positive for back pain.  Neurological: Negative for light-headedness and headaches.  Psychiatric/Behavioral: Negative for dysphoric mood. The patient is not nervous/anxious.        Objective:   Vitals:   03/26/17 1011  BP: (!) 148/80  Pulse: (!) 110  Resp: 16  Temp: 98.2 F (36.8 C)  SpO2: 96%   Wt Readings from Last 3 Encounters:  03/26/17 267 lb  (  121.1 kg)  03/22/17 265 lb (120.2 kg)  03/16/17 264 lb (119.7 kg)   Body mass index is 34.28 kg/m.   Physical Exam    Constitutional: Appears well-developed and well-nourished. No distress.  HENT:  Head: Normocephalic and atraumatic.  Neck: Neck supple. No tracheal deviation present. No thyromegaly present.  No cervical lymphadenopathy Cardiovascular: Normal rate, regular rhythm and normal heart sounds.   No murmur heard. No carotid bruit .  No edema Pulmonary/Chest: Effort normal and breath sounds normal. No respiratory distress. No has no wheezes. No rales.  Skin: Skin is warm and dry. Not diaphoretic.  Psychiatric: Normal mood and affect. Behavior is normal.      Assessment & Plan:    See Problem List for Assessment and Plan of chronic medical problems.

## 2017-03-26 ENCOUNTER — Ambulatory Visit (INDEPENDENT_AMBULATORY_CARE_PROVIDER_SITE_OTHER): Payer: Medicare Other | Admitting: Internal Medicine

## 2017-03-26 ENCOUNTER — Encounter: Payer: Self-pay | Admitting: Internal Medicine

## 2017-03-26 ENCOUNTER — Telehealth: Payer: Self-pay | Admitting: Internal Medicine

## 2017-03-26 ENCOUNTER — Ambulatory Visit (INDEPENDENT_AMBULATORY_CARE_PROVIDER_SITE_OTHER): Payer: Medicare Other | Admitting: *Deleted

## 2017-03-26 VITALS — BP 148/80 | HR 110 | Temp 98.2°F | Resp 16 | Wt 267.0 lb

## 2017-03-26 DIAGNOSIS — R7303 Prediabetes: Secondary | ICD-10-CM | POA: Diagnosis not present

## 2017-03-26 DIAGNOSIS — E7849 Other hyperlipidemia: Secondary | ICD-10-CM | POA: Diagnosis not present

## 2017-03-26 DIAGNOSIS — I2510 Atherosclerotic heart disease of native coronary artery without angina pectoris: Secondary | ICD-10-CM | POA: Diagnosis not present

## 2017-03-26 DIAGNOSIS — I10 Essential (primary) hypertension: Secondary | ICD-10-CM | POA: Diagnosis not present

## 2017-03-26 DIAGNOSIS — K219 Gastro-esophageal reflux disease without esophagitis: Secondary | ICD-10-CM | POA: Diagnosis not present

## 2017-03-26 DIAGNOSIS — Z0000 Encounter for general adult medical examination without abnormal findings: Secondary | ICD-10-CM | POA: Diagnosis not present

## 2017-03-26 NOTE — Telephone Encounter (Signed)
Copied from [place] [phone]. Topic: Quick Communication - See Telephone Encounter >> Mar 26, 2017  9:14 AM Bea Graff, NT wrote: CRM for notification. See Telephone encounter for:  Pts wife called and states she wanted Dr. Quay Burow to be aware that her husband got out of breath this morning walking the trash to end of the driveway. He is coming in at 33 for an appt and she is afraid he won't mention it and she is just worried about her husband.   03/26/17.

## 2017-03-26 NOTE — Patient Instructions (Signed)
Continue doing brain stimulating activities (puzzles, reading, adult coloring books, staying active) to keep memory sharp.   Continue to eat heart healthy diet (full of fruits, vegetables, whole grains, lean protein, water--limit salt, fat, and sugar intake) and increase physical activity as tolerated.   [name] , Thank you for taking time to come for your Medicare Wellness Visit. I appreciate your ongoing commitment to your health goals. Please review the following plan we discussed and let me know if I can assist you in the future.   These are the goals we discussed: Goals    . Patient Stated     Loose weight, keep junk out of the house, increase physical activity.        This is a list of the screening recommended for you and due dates:  Health Maintenance  Topic Date Due  . Tetanus Vaccine  04/24/2017  . Colon Cancer Screening  06/25/2018  . Flu Shot  Completed  .  Hepatitis C: One time screening is recommended by Center for Disease Control  (CDC) for  adults born from [birth year] through 1966.   Completed  . Pneumonia vaccines  Completed

## 2017-03-26 NOTE — Telephone Encounter (Signed)
noted 

## 2017-03-26 NOTE — Assessment & Plan Note (Signed)
Following with cardiology No chest pain, palpitations or edema Chronic shortness of breath fairly unchanged He knows he needs to lose weight and increase his activity, which will likely improve his shortness of breath Continue current medications

## 2017-03-26 NOTE — Assessment & Plan Note (Signed)
Lipid panel has been well controlled Continue statin Encouraged him to increase his activity level and work on weight loss Encouraged healthy diet

## 2017-03-26 NOTE — Assessment & Plan Note (Signed)
A1c has been very stable in the prediabetic range-last blood work maximally 3 months ago Will hold off on repeating blood work now Encouraged low [employer] Encouraged regular exercise and weight loss Follow-up in 6 months

## 2017-03-26 NOTE — Assessment & Plan Note (Signed)
GERD controlled Continue daily medication  

## 2017-03-26 NOTE — Assessment & Plan Note (Signed)
BP Readings from Last 3 Encounters:  03/26/17 (!) 148/80  03/22/17 130/90  03/16/17 128/76    Blood pressure slightly elevated today, but in general better controlled Continue current medications CMP within the past few months normal we will not repeat today Continue current medications

## 2017-04-04 ENCOUNTER — Other Ambulatory Visit: Payer: Self-pay | Admitting: Internal Medicine

## 2017-04-07 ENCOUNTER — Ambulatory Visit (INDEPENDENT_AMBULATORY_CARE_PROVIDER_SITE_OTHER): Payer: Medicare Other | Admitting: Pharmacist Clinician (PhC)/ Clinical Pharmacy Specialist

## 2017-04-07 DIAGNOSIS — Z86718 Personal history of other venous thrombosis and embolism: Secondary | ICD-10-CM

## 2017-04-07 DIAGNOSIS — I480 Paroxysmal atrial fibrillation: Secondary | ICD-10-CM

## 2017-04-07 DIAGNOSIS — Z7901 Long term (current) use of anticoagulants: Secondary | ICD-10-CM

## 2017-04-07 LAB — POCT INR: INR: 1.8

## 2017-04-07 NOTE — Patient Instructions (Signed)
Description   Increase dose to 2 tablets daily except 1.5 tablets each Sunday and Thursday.  Repeat INR in 2 weeks

## 2017-04-15 NOTE — Progress Notes (Signed)
HPI  The patient presents for follow up of atrial fibrillation, HTN, prior DVT and pulmonary emboli and CAD.   He has received some of his care at [facility] as outline previously including atrial fib ablation. He had an EP evaluation and did have some short paroxysms of fibrillation on the monitor apparently. However, these of been only mildly symptomatic. He had a negative perfusion study. He had an echocardiogram.  I reviewed the records from [facility] for this visit from his 2018 visit because he is still SOB.  I reviewed pulmonary records as well.  He continues to be short of breath with activity.  He has to stop after walking 50 yards.  He tries to walk routinely.  He is not having any new chest pressure, neck or arm discomfort.  There is been no new palpitations, presyncope or syncope.  He has had improvement in his plantar fasciitis.  Allergies  Allergen Reactions  . Penicillins Anaphylaxis     Because of a history of documented adverse serious drug reaction;Medi Alert bracelet  is recommended    Current Outpatient Medications  Medication Sig Dispense Refill  . amLODipine (NORVASC) 5 MG tablet Take 1 tablet (5 mg total) by mouth daily. 90 tablet 1  . aspirin 81 MG chewable tablet Chew 1 tablet by mouth daily.    [name] atorvastatin (LIPITOR) 10 MG tablet TAKE 1 TABLET EVERY EVENING 90 tablet 3  . gabapentin (NEURONTIN) 300 MG capsule Take 2 capsules (600 mg total) by mouth at bedtime. 180 capsule 3  . HYDROcodone-acetaminophen (NORCO/VICODIN) 5-325 MG tablet Take 1 tablet by mouth every 6 (six) hours as needed for moderate pain.    . metFORMIN (GLUCOPHAGE) 500 MG tablet Take 1 tablet (500 mg total) by mouth 2 (two) times daily with a meal. Keep scheduled appt for future refills 180 tablet 0  . methocarbamol (ROBAXIN) 500 MG tablet Take 500 mg daily as needed by mouth for muscle spasms.    . nitroGLYCERIN (NITROSTAT) 0.4 MG SL tablet Place 1 tablet (0.4 mg total) under the  tongue every 5 (five) minutes as needed for chest pain (up to 3 doses). 25 tablet 4  . pantoprazole (PROTONIX) 40 MG tablet TAKE 1 TABLET DAILY 90 tablet 3  . traZODone (DESYREL) 50 MG tablet Take 0.5-1 tablets (25-50 mg total) by mouth at bedtime as needed for sleep. 30 tablet 3  . venlafaxine XR (EFFEXOR XR) 75 MG 24 hr capsule Take 1 capsule (75 mg total) by mouth daily with breakfast. 90 capsule 3  . vitamin B-12 (CYANOCOBALAMIN) 1000 MCG tablet Take 1,000 mcg by mouth daily.    . Vitamin D, Ergocalciferol, (DRISDOL) 50000 units CAPS capsule     . warfarin (COUMADIN) 3 MG tablet TAKE ONE AND ONE-HALF TO TWO TABLETS DAILY AS DIRECTED BY COUMADIN CLINIC 180 tablet 1  . Fluticasone-Umeclidin-Vilant (TRELEGY ELLIPTA) 100-62.5-25 MCG/INH AEPB Trelegy Ellipta 100 mcg-62.5 mcg-25 mcg powder for inhalation     No current facility-administered medications for this visit.     Past Medical History:  Diagnosis Date  . Arthritis   . Asthma   . BPH (benign prostatic hypertrophy)   . CAD (coronary artery disease)    a. 07/2013: s/p DES to LAD, normal LVF.  [name] Complication of anesthesia    "I got all kinds of hallucinations"  . COPD (chronic obstructive pulmonary disease) ([place])    a. 07/2013 PFT's mild airflow obstruction, no restriction, sev decrease in DLCO.  . Diverticulosis   .  DVT (deep venous thrombosis) ([place])    a. 2010 Lower ext s/p back surgery.  [name] Dyspnea on exertion    a. 07/2013 PFT's mild airflow obstr   . GERD (gastroesophageal reflux disease)   . History of gout   . History of hiatal hernia   . Hx of echocardiogram 2015   Echo (06/2013): EF 60-65% normal wall motion, normal diastolic function, aortic sclerosis without stenosis, Trivial MR, mild SAM due to long, redundant mitral leaflets, mild RAE, normal RVSF  . Hypertension   . Macrocytic anemia    a. 07/2013: documented on prior labs.  . Obesity   . OSA (obstructive sleep apnea) 11/14/2015  . PAF (paroxysmal atrial fibrillation)  ([place])    a. Flecainide discontinued 07/2013 in setting of CAD.; b. s/p PVI Ablation at [facility] (Dr Joseph Berkshire) 11/2014  . Pneumonia ~ 2010 X 1  . Pulmonary embolism ([place])    a. 2010 in setting of DVT post-op back surgery. b. Low prob VQ 07/2013.  [name] Spinal stenosis    Congential  . Transaminitis    a. 07/2013: mild.  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 ANTERIOR LUMBAR [place] ARTHROPLASTY  03/2008   "spacer poped out; had to repair"  . CATARACT EXTRACTION W/ INTRAOCULAR LENS  IMPLANT, BILATERAL Bilateral   . COLONOSCOPY W/ POLYPECTOMY  2004  . Colonoscopy with polypectomy  09/2011   2 tubular adenomas  . CORONARY ANGIOPLASTY WITH STENT PLACEMENT  08/08/2013   "1"  . JOINT REPLACEMENT    . LEFT AND RIGHT HEART CATHETERIZATION WITH CORONARY ANGIOGRAM N/A 08/07/2013   Procedure: LEFT AND RIGHT HEART CATHETERIZATION WITH CORONARY ANGIOGRAM;  Surgeon: Peter M Martinique, MD;  Location: [facility] CATH LAB;  Service: Cardiovascular;  Laterality: N/A;  . LUMBAR FUSION  02/2008  . PERCUTANEOUS CORONARY STENT INTERVENTION (PCI-S)  08/07/2013   Procedure: PERCUTANEOUS CORONARY STENT INTERVENTION (PCI-S);  Surgeon: Peter M Martinique, MD;  Location: [facility] CATH LAB;  Service: Cardiovascular;;  . PVI ablation  11/2014   Dr. Venita Sheffield [facility]  . TOTAL HIP ARTHROPLASTY Bilateral   . VASECTOMY      ROS:  As stated in the HPI and negative for all other systems.  PHYSICAL EXAM BP 128/68   Pulse 79   Ht 6\' 2"  (1.88 m)   Wt 269 lb 6.4 oz (122.2 kg)   BMI 34.59 kg/m   GENERAL:  Well appearing NECK:  No jugular venous distention, waveform within normal limits, carotid upstroke brisk and symmetric, no bruits, no thyromegaly LUNGS:  Clear to auscultation bilaterally CHEST:  Unremarkable HEART:  PMI not displaced or sustained,S1 and S2 within normal limits, no S3, no S4, no clicks, no rubs, no murmurs ABD:  Flat, positive bowel sounds normal in  frequency in pitch, no bruits, no rebound, no guarding, no midline pulsatile mass, no hepatomegaly, no splenomegaly EXT:  2 plus pulses throughout, no edema, no cyanosis no clubbing   EKG:  Sinus rhythm, rate 79, axis within normal limits, early transition in lead V2, no acute ST-T wave changes.  04/16/2017   ASSESSMENT AND PLAN  ATRIAL FIBRILLATION:   [name] has a CHA2DS2 - VASc score of 3.   I do not know that there have been any symptomatic paroxysms.  He will continue with anticoagulation.  DYSPNEA:   The etiology of this is not clear.  I did look at Dr. Anastasia Pall notes and he is  being treated for COPD and asthma but is not had significant improvement.  I reviewed the records from [facility] clinic and he had no ischemia or significant abnormalities on his echo last year.   We walked him around the office today.  He is quite breathless with activity.    O2 level fell from high 90s to low 90s.  I will check a BNP level although I do not strongly suspect heart failure.  I will discuss further with Dr. Lake Bells.  AAA:     This was 3.1. No further imaging at this time.   CAD:  He had negative stress test in 2018.  I do not suspect ischemia as his complaints predated this.  No further testing except as above.   HTN:   The blood pressure is at target. No change in medications is indicated. We will continue with therapeutic lifestyle changes (TLC).  MR:        This was not significant on his echo done at the [facility] clinic earlier this year.  I will follow this with repeat echoes in the future.    SLEEP APNEA:  Follow up per Dr. Halford Chessman.

## 2017-04-16 ENCOUNTER — Encounter: Payer: Self-pay | Admitting: Cardiology

## 2017-04-16 ENCOUNTER — Ambulatory Visit (INDEPENDENT_AMBULATORY_CARE_PROVIDER_SITE_OTHER): Payer: Medicare Other | Admitting: Cardiology

## 2017-04-16 ENCOUNTER — Ambulatory Visit (INDEPENDENT_AMBULATORY_CARE_PROVIDER_SITE_OTHER): Payer: Medicare Other | Admitting: Pharmacist Clinician (PhC)/ Clinical Pharmacy Specialist

## 2017-04-16 VITALS — BP 128/68 | HR 79 | Ht 74.0 in | Wt 269.4 lb

## 2017-04-16 DIAGNOSIS — Z7901 Long term (current) use of anticoagulants: Secondary | ICD-10-CM

## 2017-04-16 DIAGNOSIS — I714 Abdominal aortic aneurysm, without rupture, unspecified: Secondary | ICD-10-CM

## 2017-04-16 DIAGNOSIS — I2510 Atherosclerotic heart disease of native coronary artery without angina pectoris: Secondary | ICD-10-CM

## 2017-04-16 DIAGNOSIS — I480 Paroxysmal atrial fibrillation: Secondary | ICD-10-CM

## 2017-04-16 DIAGNOSIS — I340 Nonrheumatic mitral (valve) insufficiency: Secondary | ICD-10-CM | POA: Insufficient documentation

## 2017-04-16 DIAGNOSIS — R0602 Shortness of breath: Secondary | ICD-10-CM | POA: Diagnosis not present

## 2017-04-16 DIAGNOSIS — Z86718 Personal history of other venous thrombosis and embolism: Secondary | ICD-10-CM | POA: Diagnosis not present

## 2017-04-16 DIAGNOSIS — I10 Essential (primary) hypertension: Secondary | ICD-10-CM | POA: Diagnosis not present

## 2017-04-16 LAB — POCT INR: INR: 1.8

## 2017-04-16 NOTE — Patient Instructions (Signed)
Description   Take 2.5 tablets today Friday Feb 8, then increase dose to 2 tablets daily except 1.5 tablets each Sunday and Thursday.  Repeat INR in 2 weeks

## 2017-04-16 NOTE — Patient Instructions (Signed)
Medication Instructions:  Continue current medications  If you need a refill on your cardiac medications before your next appointment, please call your pharmacy.  Labwork: BNP Today HERE IN OUR OFFICE AT [facility]  Take the provided lab slips for you to take with you to the lab for you blood draw.   You will NOT need to fast   You may go to any [facility] lab that is convenient for you however, we do have a lab in our office that is able to assist you. You do NOT need an appointment for our lab. Once in our office lobby there is a podium to the right of the check-in desk where you are to sign-in and ring a doorbell to alert [place] you are here. Lab is open Monday-Friday from 8:00am to 4:00pm; and is closed for lunch from 12:45p-1:45pm   Testing/Procedures: None Ordered  Follow-Up: Your physician wants you to follow-up in: 1 Year. You should receive a reminder letter in the mail two months in advance. If you do not receive a letter, please call our office [phone].    Thank you for choosing [facility] at [facility]!!

## 2017-04-17 LAB — BRAIN NATRIURETIC PEPTIDE: BNP: 26 pg/mL (ref 0.0–100.0)

## 2017-04-19 NOTE — Progress Notes (Signed)
[name] [address] [place], [place] [id number] Phone: [phone] Subjective:    I'm seeing this patient by the request  of:    CC: Leg pain follow-up  [name] [name]  [name] [name] is a 72 y.o. male coming in with complaint of leg pain.  Seen previously.  History of spinal stenosis at L3 through L5.  Patient has had fusion in this area.  Continues to have more of the left leg pain.  Leg pain seemed to be only at night.  Increase gabapentin to 600 mg and trazodone for breakthrough pain at night. He has been feeling better since last visit.     Past Medical History:  Diagnosis Date  . Arthritis   . Asthma   . BPH (benign prostatic hypertrophy)   . CAD (coronary artery disease)    a. 07/2013: s/p DES to LAD, normal LVF.  [name] Complication of anesthesia    "I got all kinds of hallucinations"  . COPD (chronic obstructive pulmonary disease) ([place])    a. 07/2013 PFT's mild airflow obstruction, no restriction, sev decrease in DLCO.  . Diverticulosis   . DVT (deep venous thrombosis) ([place])    a. 2010 Lower ext s/p back surgery.  [name] Dyspnea on exertion    a. 07/2013 PFT's mild airflow obstr   . GERD (gastroesophageal reflux disease)   . History of gout   . History of hiatal hernia   . Hx of echocardiogram 2015   Echo (06/2013): EF 60-65% normal wall motion, normal diastolic function, aortic sclerosis without stenosis, Trivial MR, mild SAM due to long, redundant mitral leaflets, mild RAE, normal RVSF  . Hypertension   . Macrocytic anemia    a. 07/2013: documented on prior labs.  . Obesity   . OSA (obstructive sleep apnea) 11/14/2015  . PAF (paroxysmal atrial fibrillation) ([place])    a. Flecainide discontinued 07/2013 in setting of CAD.; b. s/p PVI Ablation at [facility] (Dr Joseph Berkshire) 11/2014  . Pneumonia ~ 2010 X 1  . Pulmonary embolism ([place])    a. 2010 in setting of DVT post-op back surgery. b. Low prob VQ 07/2013.  [name] Spinal stenosis    Congential  .  Transaminitis    a. 07/2013: mild.  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 ANTERIOR LUMBAR [place] ARTHROPLASTY  03/2008   "spacer poped out; had to repair"  . CATARACT EXTRACTION W/ INTRAOCULAR LENS  IMPLANT, BILATERAL Bilateral   . COLONOSCOPY W/ POLYPECTOMY  2004  . Colonoscopy with polypectomy  09/2011   2 tubular adenomas  . CORONARY ANGIOPLASTY WITH STENT PLACEMENT  08/08/2013   "1"  . JOINT REPLACEMENT    . LEFT AND RIGHT HEART CATHETERIZATION WITH CORONARY ANGIOGRAM N/A 08/07/2013   Procedure: LEFT AND RIGHT HEART CATHETERIZATION WITH CORONARY ANGIOGRAM;  Surgeon: Peter M Martinique, MD;  Location: [facility] CATH LAB;  Service: Cardiovascular;  Laterality: N/A;  . LUMBAR FUSION  02/2008  . PERCUTANEOUS CORONARY STENT INTERVENTION (PCI-S)  08/07/2013   Procedure: PERCUTANEOUS CORONARY STENT INTERVENTION (PCI-S);  Surgeon: Peter M Martinique, MD;  Location: [facility] CATH LAB;  Service: Cardiovascular;;  . PVI ablation  11/2014   Dr. Venita Sheffield [facility]  . TOTAL HIP ARTHROPLASTY Bilateral   . VASECTOMY     Social History   Socioeconomic History  . Marital status: Married    Spouse name: Not on file  . Number of children: 4  .  Years of education: Not on file  . Highest education level: Not on file  Social Needs  . Financial resource strain: Not hard at all  . Food insecurity - worry: Never true  . Food insecurity - inability: Never true  . Transportation needs - medical: No  . Transportation needs - non-medical: No  Occupational History  . Occupation: Retired from [employer]    Employer: RETIRED  Tobacco Use  . Smoking status: Former Smoker    Packs/day: 2.00    Years: 40.00    Pack years: 80.00    Types: Cigarettes    Last attempt to quit: 03/09/2000    Years since quitting: 17.1  . Smokeless tobacco: Never Used  . Tobacco comment: smoked 1964-2002, up to 3 ppd  Substance and Sexual Activity  . Alcohol use: Yes    Alcohol/week: 1.2 -  1.8 oz    Types: 2 - 3 Cans of beer per week    Comment: drinks beer several times weekly  . Drug use: No  . Sexual activity: No    Comment: "sexually hx is none of your business" (02/08/2014)  Other Topics Concern  . Not on file  Social History Narrative  . Not on file   Allergies  Allergen Reactions  . Penicillins Anaphylaxis     Because of a history of documented adverse serious drug reaction;Medi Alert bracelet  is recommended   Family History  Problem Relation Age of Onset  . Heart attack Paternal Grandfather        Over 8  . Atrial fibrillation Mother   . COPD Mother   . Heart disease Father   . Colon cancer Father   . Heart attack Brother 61  . Alcohol abuse Brother 72       Died post liver transplant  . Heart attack Paternal Uncle        Less than 64  . Stroke Neg Hx      Past medical history, social, surgical and family history all reviewed in electronic medical record.  No pertanent information unless stated regarding to the chief complaint.   Review of Systems:Review of systems updated and as accurate as of 04/19/17  No headache, visual changes, nausea, vomiting, diarrhea, constipation, dizziness, abdominal pain, skin rash, fevers, chills, night sweats, weight loss, swollen lymph nodes, body aches, joint swelling, muscle aches, chest pain, shortness of breath, mood changes.   Objective  There were no vitals taken for this visit. Systems examined below as of 04/19/17   General: No apparent distress alert and oriented x3 mood and affect normal, dressed appropriately.  HEENT: Pupils equal, extraocular movements intact  Respiratory: Patient's speak in full sentences and does not appear short of breath  Cardiovascular: No lower extremity edema, non tender, no erythema  Skin: Warm dry intact with no signs of infection or rash on extremities or on axial skeleton.  Abdomen: Soft nontender  Neuro: Cranial nerves II through XII are intact, neurovascularly intact in  all extremities with 2+ DTRs and 2+ pulses.  Lymph: No lymphadenopathy of posterior or anterior cervical chain or axillae bilaterally.  Gait normal with good balance and coordination.  MSK:  Non tender with full range of motion and good stability and symmetric strength and tone of shoulders, elbows, wrist, hip, knee and ankles bilaterally.  Arthritic changes noted.  Back Exam:  Inspection: Degenerative scoliosis Motion: Flexion 35 deg, Extension 25 deg, Side Bending to 35 deg bilaterally,  Rotation to 35 deg bilaterally  SLR laying:  Negative  XSLR laying: Negative  Palpable tenderness: Tender to palpation the paraspinal musculature.[name] FABER: Positive left. Sensory change: Gross sensation intact to all lumbar and sacral dermatomes.  Reflexes: 2+ at both patellar tendons, 2+ at achilles tendons, Babinski's downgoing.  Strength at foot  Plantar-flexion: 5/5 Dorsi-flexion: 5/5 Eversion: 5/5 Inversion: 5/5  Leg strength  Quad: 5/5 Hamstring: 5/5 Hip flexor: 5/5 Hip abductors: 5/5  Gait unremarkable.    Impression and Recommendations:     This case required medical decision making of moderate complexity.      Note: This dictation was prepared with Dragon dictation along with smaller phrase technology. Any transcriptional errors that result from this process are unintentional.

## 2017-04-20 ENCOUNTER — Ambulatory Visit (INDEPENDENT_AMBULATORY_CARE_PROVIDER_SITE_OTHER): Payer: Medicare Other | Admitting: Family Medicine

## 2017-04-20 ENCOUNTER — Encounter: Payer: Self-pay | Admitting: Family Medicine

## 2017-04-20 DIAGNOSIS — I2510 Atherosclerotic heart disease of native coronary artery without angina pectoris: Secondary | ICD-10-CM

## 2017-04-20 DIAGNOSIS — M48062 Spinal stenosis, lumbar region with neurogenic claudication: Secondary | ICD-10-CM

## 2017-04-20 NOTE — Assessment & Plan Note (Signed)
Discussed with patient.  Still some numbness likely from the back  Discussed icing regimen.  HEP given again  Discussed with the possibility of an EMG or possible MRI scan.  Patient wants to hold off on this at this time.  Continue all other medications.  Follow-up again as needed

## 2017-04-23 ENCOUNTER — Other Ambulatory Visit: Payer: Self-pay

## 2017-04-23 ENCOUNTER — Telehealth: Payer: Self-pay | Admitting: Cardiology

## 2017-04-23 ENCOUNTER — Encounter: Payer: Self-pay | Admitting: Pulmonary Disease

## 2017-04-23 MED ORDER — FLUTICASONE-UMECLIDIN-VILANT 100-62.5-25 MCG/INH IN AEPB: 1.0000 | INHALATION_SPRAY | Freq: Every day | RESPIRATORY_TRACT | 1 refills | Status: DC

## 2017-04-23 NOTE — Telephone Encounter (Signed)
Spoke with pt wife, lab work, bnp was normal.

## 2017-04-23 NOTE — Telephone Encounter (Signed)
Patient wife (Mrs. Sasso) calling, states that she is returning the call for results.

## 2017-05-03 ENCOUNTER — Ambulatory Visit (INDEPENDENT_AMBULATORY_CARE_PROVIDER_SITE_OTHER): Payer: Medicare Other | Admitting: Pharmacist Clinician (PhC)/ Clinical Pharmacy Specialist

## 2017-05-03 ENCOUNTER — Telehealth: Payer: Self-pay | Admitting: Internal Medicine

## 2017-05-03 DIAGNOSIS — Z86718 Personal history of other venous thrombosis and embolism: Secondary | ICD-10-CM | POA: Diagnosis not present

## 2017-05-03 DIAGNOSIS — Z7901 Long term (current) use of anticoagulants: Secondary | ICD-10-CM

## 2017-05-03 DIAGNOSIS — I480 Paroxysmal atrial fibrillation: Secondary | ICD-10-CM

## 2017-05-03 LAB — POCT INR: INR: 2

## 2017-05-03 NOTE — Patient Instructions (Signed)
Description   Continue with 2 tablets daily except 1.5 tablets each Sunday and Thursday.  Repeat INR in 4 weeks

## 2017-05-03 NOTE — Telephone Encounter (Signed)
noted 

## 2017-05-03 NOTE — Telephone Encounter (Signed)
Copied from [place]. Topic: Quick Communication - See Telephone Encounter >> May 03, 2017 10:29 AM Boyd Kerbs wrote: CRM for notification. See Telephone encounter for:    Wife, [name] [name], called concerned about [name] [name]. He is talking excessively, not sure if he knows what he is saying, talking venlafaxine XR (EFFEXOR XR) 75 MG 24 hr capsule, and had drank a few  beers (doesn't drink a lot) .  But she notices this other times when he hasn't been drinking.  Like he is obsessive compulsive behavior.   She says not acting like his self. Repeats himself a lot. It's like a personality change. Others are noticing it too. Pushing things to far teasing the kids or grandchildren. Sometimes Forgetful.   She is concerned about him. Should she make an appt. With Dr. Quay Burow or should he see someone else?    05/03/17.

## 2017-05-03 NOTE — Telephone Encounter (Signed)
Patient has been made for [facility], [place] 2/27.   Sending this to you as a FYI!

## 2017-05-05 ENCOUNTER — Ambulatory Visit: Payer: Medicare Other | Admitting: Internal Medicine

## 2017-05-05 DIAGNOSIS — Z0289 Encounter for other administrative examinations: Secondary | ICD-10-CM

## 2017-05-13 ENCOUNTER — Other Ambulatory Visit: Payer: Self-pay | Admitting: Internal Medicine

## 2017-05-31 ENCOUNTER — Ambulatory Visit: Payer: Medicare Other | Admitting: Family

## 2017-05-31 ENCOUNTER — Other Ambulatory Visit (HOSPITAL_COMMUNITY): Payer: Medicare Other

## 2017-06-09 ENCOUNTER — Other Ambulatory Visit: Payer: Self-pay

## 2017-06-15 DIAGNOSIS — I482 Chronic atrial fibrillation: Secondary | ICD-10-CM | POA: Diagnosis not present

## 2017-06-15 DIAGNOSIS — I2510 Atherosclerotic heart disease of native coronary artery without angina pectoris: Secondary | ICD-10-CM | POA: Diagnosis not present

## 2017-06-15 DIAGNOSIS — E6609 Other obesity due to excess calories: Secondary | ICD-10-CM | POA: Diagnosis not present

## 2017-06-15 DIAGNOSIS — I2782 Chronic pulmonary embolism: Secondary | ICD-10-CM | POA: Diagnosis not present

## 2017-06-15 DIAGNOSIS — I480 Paroxysmal atrial fibrillation: Secondary | ICD-10-CM | POA: Diagnosis not present

## 2017-06-15 DIAGNOSIS — E782 Mixed hyperlipidemia: Secondary | ICD-10-CM | POA: Diagnosis not present

## 2017-06-15 DIAGNOSIS — J449 Chronic obstructive pulmonary disease, unspecified: Secondary | ICD-10-CM | POA: Diagnosis not present

## 2017-06-15 DIAGNOSIS — Z955 Presence of coronary angioplasty implant and graft: Secondary | ICD-10-CM | POA: Diagnosis not present

## 2017-06-15 DIAGNOSIS — Z8679 Personal history of other diseases of the circulatory system: Secondary | ICD-10-CM | POA: Diagnosis not present

## 2017-06-15 DIAGNOSIS — Z9889 Other specified postprocedural states: Secondary | ICD-10-CM | POA: Diagnosis not present

## 2017-06-15 DIAGNOSIS — Z7901 Long term (current) use of anticoagulants: Secondary | ICD-10-CM | POA: Diagnosis not present

## 2017-06-15 DIAGNOSIS — R0602 Shortness of breath: Secondary | ICD-10-CM | POA: Diagnosis not present

## 2017-06-15 DIAGNOSIS — I10 Essential (primary) hypertension: Secondary | ICD-10-CM | POA: Diagnosis not present

## 2017-06-15 DIAGNOSIS — I481 Persistent atrial fibrillation: Secondary | ICD-10-CM | POA: Diagnosis not present

## 2017-06-21 ENCOUNTER — Telehealth: Payer: Self-pay | Admitting: Internal Medicine

## 2017-06-21 ENCOUNTER — Other Ambulatory Visit: Payer: Self-pay | Admitting: Cardiology

## 2017-06-21 DIAGNOSIS — H903 Sensorineural hearing loss, bilateral: Secondary | ICD-10-CM | POA: Diagnosis not present

## 2017-06-21 DIAGNOSIS — H9190 Unspecified hearing loss, unspecified ear: Secondary | ICD-10-CM

## 2017-06-21 NOTE — Telephone Encounter (Signed)
Copied from [place] [id number]. Topic: Referral - Request >> Jun 21, 2017 10:30 AM Robina Ade, Helene Kelp D wrote: Reason for CRM: Marita Kansas with [facility] in [place] called because patient is at the office now to see Dr. Janeece Agee and needs a referral sent over so he can be seen today due to he has medicare insurance. Their fax number is [phone].

## 2017-06-22 ENCOUNTER — Ambulatory Visit (INDEPENDENT_AMBULATORY_CARE_PROVIDER_SITE_OTHER): Payer: Medicare Other | Admitting: Pharmacist Clinician (PhC)/ Clinical Pharmacy Specialist

## 2017-06-22 ENCOUNTER — Other Ambulatory Visit: Payer: Self-pay | Admitting: Pharmacist Clinician (PhC)/ Clinical Pharmacy Specialist

## 2017-06-22 DIAGNOSIS — Z86718 Personal history of other venous thrombosis and embolism: Secondary | ICD-10-CM

## 2017-06-22 DIAGNOSIS — I480 Paroxysmal atrial fibrillation: Secondary | ICD-10-CM | POA: Diagnosis not present

## 2017-06-22 DIAGNOSIS — Z7901 Long term (current) use of anticoagulants: Secondary | ICD-10-CM

## 2017-06-22 LAB — POCT INR: INR: 1.9

## 2017-07-06 ENCOUNTER — Telehealth: Payer: Self-pay | Admitting: Family

## 2017-07-06 ENCOUNTER — Ambulatory Visit (HOSPITAL_COMMUNITY)
Admission: RE | Admit: 2017-07-06 | Discharge: 2017-07-06 | Disposition: A | Discharge status: Home / Self Care | Payer: Medicare Other | Source: Ambulatory Visit | Attending: Family | Admitting: Family

## 2017-07-06 ENCOUNTER — Ambulatory Visit: Payer: Medicare Other | Admitting: Family

## 2017-07-06 DIAGNOSIS — I714 Abdominal aortic aneurysm, without rupture, unspecified: Secondary | ICD-10-CM

## 2017-07-06 NOTE — Telephone Encounter (Signed)
Spoke with pt. He has no back pain or abdominal pain.   AAA duplex showed 3.8 cm AAA, compared to 3.1 cm 2 years prior. Common iliac arteries not visualized due to overlying bowel gas.  Before your next abdominal ultrasound:  Take two Extra-Strength Gas-X capsules at bedtime the night before the test. Take another two Extra-Strength Gas-X capsules 3 hours before the test.  Avoid gas forming foods and beverages the day before the test.     Instructions given to pt.  I advised him to follow up in 18 months.

## 2017-07-21 ENCOUNTER — Ambulatory Visit: Payer: Medicare Other | Admitting: Pulmonary Disease

## 2017-07-26 ENCOUNTER — Ambulatory Visit (INDEPENDENT_AMBULATORY_CARE_PROVIDER_SITE_OTHER): Payer: Medicare Other | Admitting: Pharmacist Clinician (PhC)/ Clinical Pharmacy Specialist

## 2017-07-26 DIAGNOSIS — Z86718 Personal history of other venous thrombosis and embolism: Secondary | ICD-10-CM | POA: Diagnosis not present

## 2017-07-26 DIAGNOSIS — I480 Paroxysmal atrial fibrillation: Secondary | ICD-10-CM | POA: Diagnosis not present

## 2017-07-26 DIAGNOSIS — M961 Postlaminectomy syndrome, not elsewhere classified: Secondary | ICD-10-CM | POA: Diagnosis not present

## 2017-07-26 DIAGNOSIS — Z7901 Long term (current) use of anticoagulants: Secondary | ICD-10-CM | POA: Diagnosis not present

## 2017-07-26 DIAGNOSIS — M5416 Radiculopathy, lumbar region: Secondary | ICD-10-CM | POA: Diagnosis not present

## 2017-07-26 LAB — POCT INR: INR: 1.8 — AB (ref 2.0–3.0)

## 2017-07-26 NOTE — Patient Instructions (Signed)
Description   Increase dose to 2 tablets daily except 1.5 tablets each Sunday.  Repeat INR in 3 weeks

## 2017-07-30 ENCOUNTER — Ambulatory Visit (INDEPENDENT_AMBULATORY_CARE_PROVIDER_SITE_OTHER): Payer: Medicare Other | Admitting: Pulmonary Disease

## 2017-07-30 ENCOUNTER — Encounter: Payer: Self-pay | Admitting: Pulmonary Disease

## 2017-07-30 VITALS — BP 150/62 | HR 68 | Ht 74.0 in | Wt 269.4 lb

## 2017-07-30 DIAGNOSIS — I2510 Atherosclerotic heart disease of native coronary artery without angina pectoris: Secondary | ICD-10-CM

## 2017-07-30 DIAGNOSIS — J449 Chronic obstructive pulmonary disease, unspecified: Secondary | ICD-10-CM

## 2017-07-30 NOTE — Patient Instructions (Signed)
COPD with asthma: Continue Trelegy 1 puff daily no matter how you feel Stay active, exercise regularly Get a flu shot in the fall  We will see you back in 1 year or sooner if needed

## 2017-07-30 NOTE — Progress Notes (Signed)
Subjective:    Patient ID: [name] [name], male    DOB: 1945/06/18, 72 y.o.   MRN: [id number]  Synopsis: COPD, history of coronary artery disease.  History of DVT.  Self-referral in 2018 for dyspnea.  HPI  Chief Complaint  Patient presents with  . Follow-up    Sob same,occass. cough-dry,clears throat a lot,wheezing the same   This has been a stable interval for [name] [name].  He has not had any exacerbations since the last visit.  No bronchitis, no pneumonia, no flareups.  He says that he is not able to exercise too much because of his back pain.  Apparently he is going in a few weeks to have another injection for that.  He says that a week or so ago he was doing a fair amount of heavy yard work and he would feel somewhat short of breath when doing this.  Bending over was very difficult for him.  In general though he thinks that the breathing has improved since starting Trelegy.  Past Medical History:  Diagnosis Date  . Arthritis   . Asthma   . BPH (benign prostatic hypertrophy)   . CAD (coronary artery disease)    a. 07/2013: s/p DES to LAD, normal LVF.  [name] Complication of anesthesia    "I got all kinds of hallucinations"  . COPD (chronic obstructive pulmonary disease) ([place])    a. 07/2013 PFT's mild airflow obstruction, no restriction, sev decrease in DLCO.  . Diverticulosis   . DVT (deep venous thrombosis) ([place])    a. 2010 Lower ext s/p back surgery.  [name] Dyspnea on exertion    a. 07/2013 PFT's mild airflow obstr   . GERD (gastroesophageal reflux disease)   . History of gout   . History of hiatal hernia   . Hx of echocardiogram 2015   Echo (06/2013): EF 60-65% normal wall motion, normal diastolic function, aortic sclerosis without stenosis, Trivial MR, mild SAM due to long, redundant mitral leaflets, mild RAE, normal RVSF  . Hypertension   . Macrocytic anemia    a. 07/2013: documented on prior labs.  . Obesity   . OSA (obstructive sleep apnea) 11/14/2015  . PAF (paroxysmal atrial  fibrillation) ([place])    a. Flecainide discontinued 07/2013 in setting of CAD.; b. s/p PVI Ablation at [facility] (Dr Joseph Berkshire) 11/2014  . Pneumonia ~ 2010 X 1  . Pulmonary embolism ([place])    a. 2010 in setting of DVT post-op back surgery. b. Low prob VQ 07/2013.  [name] Spinal stenosis    Congential  . Transaminitis    a. 07/2013: mild.  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  HENT: Negative for congestion, dental problem, ear pain, nosebleeds, postnasal drip, rhinorrhea, sinus pressure, sneezing, sore throat and trouble swallowing.   Respiratory: Positive for cough and shortness of breath. Negative for chest tightness and wheezing.   Cardiovascular: Negative for palpitations and leg swelling.       Objective:   Physical Exam  Vitals:   07/30/17 1201  BP: (!) 150/62  Pulse: 68  SpO2: 96%  Weight: 269 lb 6.4 oz (122.2 kg)  Height: 6\' 2"  (1.88 m)   RA  Gen: well appearing HENT: OP clear, TM's clear, neck supple PULM: CTA B, normal percussion CV: RRR, no mgr, trace edema GI: BS+, soft, nontender Derm: no cyanosis or rash Psyche: normal mood and affect   Pulmonary function test:  June 2016 spirometry ratio 69%, FEV1 2.65 L 68% predicted, FVC 3.82 L 75% of predicted November 2018 pulmonary function testing ratio 63%, FEV1 2.81 L 76% predicted, FVC 4.48 L 89% predicted, total lung capacity 7.8 L 99% predicted, residual volume 3.3 L 122% predicted, DLCO 21.27 mL 56% predicted  Exhaled nitric oxide testing: November 2018 25 ppm, taking inhaled corticosteroid at the time  Chest imaging: June 2016 CT chest images independently reviewed showing mild centrilobular emphysema, subsegmental atelectasis in the right middle lobe, nonspecific interstitial changes (very mild, scant) in the left lower lobe near the periphery October 2018 chest x-ray images independently reviewed showing if his edema  bilaterally, mild linear atelectasis in the bases December 2018 CT chest images independently reviewed showing no evidence of interstitial lung disease, normal pulmonary parenchyma seen, aortic calcification noted  Cardiac evaluation: Echocardiogram in April 2018 [facility] clinic normal LV function, normal RV size and function Nuclear stress test [facility] clinic April 2018 showed no evidence of ischemia  CBC    Component Value Date/Time   WBC 9.9 12/25/2016 1136   RBC 3.26 (L) 12/25/2016 1136   HGB 11.4 (L) 12/25/2016 1136   HCT 34.5 (L) 12/25/2016 1136   PLT 487.0 (H) 12/25/2016 1136   MCV 105.8 (H) 12/25/2016 1136   MCH 33.3 05/22/2014 0012   MCHC 33.0 12/25/2016 1136   RDW 16.8 (H) 12/25/2016 1136   LYMPHSABS 2.5 12/25/2016 1136   MONOABS 0.9 12/25/2016 1136   EOSABS 0.8 (H) 12/25/2016 1136   BASOSABS 0.1 12/25/2016 1136        Assessment & Plan:     COPD GOLD II  Discussion: This is been a stable interval for [name] [name], no recent exacerbations.  He needs to exercise more and try to lose weight.  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  However, in the absence of recurrent exacerbations I see no benefit from a medicine like an IL-5 inhibitor.  Plan: COPD with asthma: Continue Trelegy 1 puff daily no matter how you feel Stay active, exercise regularly Get a flu shot in the fall  We will see you back in 1 year or sooner if needed     Current Outpatient Medications:  .  amLODipine (NORVASC) 5 MG tablet, Take 1 tablet (5 mg total) by mouth daily., Disp: 90 tablet, Rfl: 1 .  aspirin 81 MG chewable tablet, Chew 1 tablet by mouth daily., Disp: , Rfl:  .  atorvastatin (LIPITOR) 10 MG tablet, TAKE 1 TABLET EVERY EVENING, Disp: 90 tablet, Rfl: 3 .  Fluticasone-Umeclidin-Vilant (TRELEGY ELLIPTA) 100-62.5-25 MCG/INH AEPB, Inhale 1 puff into the lungs daily., Disp: 180 each, Rfl: 1 .  gabapentin (NEURONTIN) 300 MG  capsule, Take 2 capsules (600 mg total) by mouth at bedtime., Disp: 180 capsule, Rfl: 3 .  HYDROcodone-acetaminophen (NORCO/VICODIN) 5-325 MG tablet, Take 1 tablet by mouth every 6 (six) hours as needed for moderate pain., Disp: , Rfl:  .  metFORMIN (GLUCOPHAGE) 500 MG tablet, Take 1 tablet (500 mg total) by mouth 2 (two) times daily with a meal., Disp: 180 tablet, Rfl: 1 .  methocarbamol (ROBAXIN) 500 MG tablet, Take 500 mg daily as needed by mouth for muscle spasms., Disp: , Rfl:  .  nitroGLYCERIN (NITROSTAT) 0.4 MG SL tablet, Place 1 tablet (0.4 mg total) under the tongue every 5 (five) minutes as needed for chest pain (up to 3 doses)., Disp: 25 tablet, Rfl: 4 .  pantoprazole (PROTONIX) 40  MG tablet, TAKE 1 TABLET DAILY, Disp: 90 tablet, Rfl: 3 .  traZODone (DESYREL) 50 MG tablet, Take 0.5-1 tablets (25-50 mg total) by mouth at bedtime as needed for sleep., Disp: 30 tablet, Rfl: 3 .  venlafaxine XR (EFFEXOR XR) 75 MG 24 hr capsule, Take 1 capsule (75 mg total) by mouth daily with breakfast., Disp: 90 capsule, Rfl: 3 .  vitamin B-12 (CYANOCOBALAMIN) 1000 MCG tablet, Take 1,000 mcg by mouth daily., Disp: , Rfl:  .  Vitamin D, Ergocalciferol, (DRISDOL) 50000 units CAPS capsule, , Disp: , Rfl:  .  warfarin (COUMADIN) 3 MG tablet, TAKE ONE AND ONE-HALF TO TWO TABLETS DAILY AS DIRECTED BY COUMADIN CLINIC, Disp: 180 tablet, Rfl: 1

## 2017-08-06 DIAGNOSIS — M5416 Radiculopathy, lumbar region: Secondary | ICD-10-CM | POA: Diagnosis not present

## 2017-08-06 DIAGNOSIS — M5137 Other intervertebral disc degeneration, lumbosacral region: Secondary | ICD-10-CM | POA: Diagnosis not present

## 2017-08-06 DIAGNOSIS — M5117 Intervertebral disc disorders with radiculopathy, lumbosacral region: Secondary | ICD-10-CM | POA: Diagnosis not present

## 2017-08-16 ENCOUNTER — Ambulatory Visit (INDEPENDENT_AMBULATORY_CARE_PROVIDER_SITE_OTHER): Payer: Medicare Other | Admitting: Pharmacist Clinician (PhC)/ Clinical Pharmacy Specialist

## 2017-08-16 DIAGNOSIS — Z7901 Long term (current) use of anticoagulants: Secondary | ICD-10-CM

## 2017-08-16 DIAGNOSIS — I480 Paroxysmal atrial fibrillation: Secondary | ICD-10-CM | POA: Diagnosis not present

## 2017-08-16 DIAGNOSIS — Z86718 Personal history of other venous thrombosis and embolism: Secondary | ICD-10-CM | POA: Diagnosis not present

## 2017-08-16 LAB — POCT INR: INR: 2.1 (ref 2.0–3.0)

## 2017-08-24 ENCOUNTER — Ambulatory Visit (INDEPENDENT_AMBULATORY_CARE_PROVIDER_SITE_OTHER): Payer: Medicare Other | Admitting: Internal Medicine

## 2017-08-24 ENCOUNTER — Other Ambulatory Visit (INDEPENDENT_AMBULATORY_CARE_PROVIDER_SITE_OTHER): Payer: Medicare Other

## 2017-08-24 ENCOUNTER — Encounter: Payer: Self-pay | Admitting: Internal Medicine

## 2017-08-24 VITALS — BP 136/84 | HR 80 | Temp 97.8°F | Resp 16 | Wt 270.0 lb

## 2017-08-24 DIAGNOSIS — E7849 Other hyperlipidemia: Secondary | ICD-10-CM | POA: Diagnosis not present

## 2017-08-24 DIAGNOSIS — I2510 Atherosclerotic heart disease of native coronary artery without angina pectoris: Secondary | ICD-10-CM

## 2017-08-24 DIAGNOSIS — F419 Anxiety disorder, unspecified: Secondary | ICD-10-CM | POA: Diagnosis not present

## 2017-08-24 DIAGNOSIS — G4709 Other insomnia: Secondary | ICD-10-CM

## 2017-08-24 DIAGNOSIS — R7303 Prediabetes: Secondary | ICD-10-CM

## 2017-08-24 DIAGNOSIS — I10 Essential (primary) hypertension: Secondary | ICD-10-CM

## 2017-08-24 DIAGNOSIS — K219 Gastro-esophageal reflux disease without esophagitis: Secondary | ICD-10-CM

## 2017-08-24 LAB — COMPREHENSIVE METABOLIC PANEL
ALK PHOS: 73 U/L (ref 39–117)
ALT: 27 U/L (ref 0–53)
AST: 27 U/L (ref 0–37)
Albumin: 4.3 g/dL (ref 3.5–5.2)
BILIRUBIN TOTAL: 0.4 mg/dL (ref 0.2–1.2)
BUN: 15 mg/dL (ref 6–23)
CALCIUM: 9.6 mg/dL (ref 8.4–10.5)
CO2: 27 meq/L (ref 19–32)
CREATININE: 0.95 mg/dL (ref 0.40–1.50)
Chloride: 105 mEq/L (ref 96–112)
GFR: 82.5 mL/min (ref >60.00)
GLUCOSE: 91 mg/dL (ref 70–99)
Potassium: 4.1 mEq/L (ref 3.5–5.1)
Sodium: 139 mEq/L (ref 135–145)
TOTAL PROTEIN: 7.3 g/dL (ref 6.0–8.3)

## 2017-08-24 LAB — CBC WITH DIFFERENTIAL/PLATELET
BASOS ABS: 0.1 10*3/uL (ref 0.0–0.1)
Basophils Relative: 1.5 % (ref 0.0–3.0)
EOS ABS: 0.4 10*3/uL (ref 0.0–0.7)
Eosinophils Relative: 4.3 % (ref 0.0–5.0)
HEMATOCRIT: 33.3 % — AB (ref 39.0–52.0)
Hemoglobin: 11.3 g/dL — ABNORMAL LOW (ref 13.0–17.0)
LYMPHS PCT: 23.8 % (ref 12.0–46.0)
Lymphs Abs: 2.4 10*3/uL (ref 0.7–4.0)
MCHC: 34 g/dL (ref 30.0–36.0)
MCV: 103.3 fl — AB (ref 78.0–100.0)
MONOS PCT: 8 % (ref 3.0–12.0)
Monocytes Absolute: 0.8 10*3/uL (ref 0.1–1.0)
NEUTROS PCT: 62.4 % (ref 43.0–77.0)
Neutro Abs: 6.2 10*3/uL (ref 1.4–7.7)
Platelets: 422 10*3/uL — ABNORMAL HIGH (ref 150.0–400.0)
RBC: 3.22 Mil/uL — AB (ref 4.22–5.81)
RDW: 17 % — ABNORMAL HIGH (ref 11.5–15.5)
WBC: 9.9 10*3/uL (ref 4.0–10.5)

## 2017-08-24 LAB — LIPID PANEL
CHOL/HDL RATIO: 2
Cholesterol: 113 mg/dL (ref 0–200)
HDL: 46.1 mg/dL (ref >39.00)
LDL Cholesterol: 48 mg/dL (ref 0–99)
NONHDL: 66.4
Triglycerides: 93 mg/dL (ref 0.0–149.0)
VLDL: 18.6 mg/dL (ref 0.0–40.0)

## 2017-08-24 LAB — HEMOGLOBIN A1C: Hgb A1c MFr Bld: 6.4 % (ref 4.6–6.5)

## 2017-08-24 MED ORDER — ESZOPICLONE 3 MG PO TABS: 3.0000 mg | ORAL_TABLET | Freq: Every day | ORAL | 0 refills | Status: DC

## 2017-08-24 NOTE — Progress Notes (Signed)
Subjective:    Patient ID: [name] [name], male    DOB: Jul 26, 1945, 72 y.o.   MRN: [id number]  HPI The patient is here for follow up.  COPD, obstructive sleep apnea: He is following with pulmonary.  He is using his CPAP nightly.  He uses his inhaler daily, but is unsure if it helps.  He has chronic dyspnea with exertion and fatigue with exertion.  He has a chronic cough and wheeze that is unchanged.  .  Prediabetes: He is taking metformin daily as prescribed.  He is somewhat compliant with a low sugar/carbohydrate diet.  He is exercising regularly - doing some walking.  CAD, Hypertension: He is taking his medication daily. He is compliant with a low sodium diet.  He denies chest pain, palpitations, edema, shortness of breath and regular headaches. He is not exercising regularly - some walking.  He does not monitor his blood pressure at home.    Hyperlipidemia: He is taking his medication daily. He is compliant with a low fat/cholesterol diet. He is exercising some. He denies myalgias.   Insomnia: He was taking trazodone nightly, but it did not work.  He did take his wife's clonazepam 1 mg at night and that helped.  He does not sleep good and needs something for sleep.    Head is hot:  When he lays down he feels his armpits to the top of his head is very hot.  It can last 55 min.  It is every night or when he takes a nap - it does not matter what side he sleeps on or where he sleeps.  He does not feel it during the day.     Medications and allergies reviewed with patient and updated if appropriate.  Patient Active Problem List   Diagnosis Date Noted  . Non-rheumatic mitral regurgitation 04/16/2017  . Bilateral leg edema 12/26/2016  . Anxiety 12/26/2016  . Scar tissue 11/02/2016  . Plantar fasciitis 10/06/2016  . 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 Vitamin D deficiency 07/21/2016  . OSA (obstructive sleep apnea) 11/14/2015  . Prediabetes 04/17/2015  . COPD GOLD II 08/21/2014  .  Hyperlipidemia 08/20/2014  . Spinal stenosis of lumbar region 05/30/2014  . Abnormal CT of the abdomen 05/30/2014  . Dizziness 02/08/2014  . Macrocytic anemia 08/08/2013  . CAD (coronary artery disease)   . AAA (abdominal aortic aneurysm) without rupture ([place]) 05/02/2013  . Obesity (BMI 30-39.9) 03/21/2013  . Family history of malignant neoplasm of gastrointestinal tract 08/25/2011  . 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 Fatigue 03/24/2011  . DOE (dyspnea on exertion) 03/12/2011  . Warfarin anticoagulation 03/11/2011  . INSOMNIA-SLEEP DISORDER-UNSPEC 09/12/2009  . CERVICAL RADICULOPATHY, RIGHT 07/24/2009  . DEGENERATIVE JOINT DISEASE, ADVANCED 05/21/2009  . AVASCULAR NECROSIS 05/21/2009  . ALLERGIC RHINITIS CAUSE UNSPECIFIED 11/01/2008  . PULMONARY NODULE, RIGHT LOWER LOBE 10/25/2008  . DIVERTICULOSIS, COLON 10/07/2008  . 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 PULMONARY EMBOLISM, HX OF 10/05/2008  . ASTHMA 02/20/2008  . HYPERPLASIA PROSTATE UNS W/UR OBST & OTH LUTS 06/28/2007  . PAF (paroxysmal atrial fibrillation) ([place]) 06/25/2007  . NOCTURIA 05/12/2007  . COLONIC POLYPS 04/01/2006  . GOUT 04/01/2006  . Essential hypertension 04/01/2006  . ACID REFLUX DISEASE 04/01/2006    Current Outpatient Medications on File Prior to Visit  Medication Sig Dispense Refill  . amLODipine (NORVASC) 5 MG tablet Take 1 tablet (5 mg total) by mouth daily. 90 tablet 1  . aspirin 81 MG chewable tablet  Chew 1 tablet by mouth daily.    [name] atorvastatin (LIPITOR) 10 MG tablet TAKE 1 TABLET EVERY EVENING 90 tablet 3  . Fluticasone-Umeclidin-Vilant (TRELEGY ELLIPTA) 100-62.5-25 MCG/INH AEPB Inhale 1 puff into the lungs daily. 180 each 1  . gabapentin (NEURONTIN) 300 MG capsule Take 2 capsules (600 mg total) by mouth at bedtime. 180 capsule 3  . metFORMIN (GLUCOPHAGE) 500 MG tablet Take 1 tablet (500 mg total) by mouth 2 (two) times daily with a meal. 180 tablet 1  . nitroGLYCERIN  (NITROSTAT) 0.4 MG SL tablet Place 1 tablet (0.4 mg total) under the tongue every 5 (five) minutes as needed for chest pain (up to 3 doses). 25 tablet 4  . pantoprazole (PROTONIX) 40 MG tablet TAKE 1 TABLET DAILY 90 tablet 3  . traZODone (DESYREL) 50 MG tablet Take 0.5-1 tablets (25-50 mg total) by mouth at bedtime as needed for sleep. 30 tablet 3  . venlafaxine XR (EFFEXOR XR) 75 MG 24 hr capsule Take 1 capsule (75 mg total) by mouth daily with breakfast. 90 capsule 3  . vitamin B-12 (CYANOCOBALAMIN) 1000 MCG tablet Take 1,000 mcg by mouth daily.    . Vitamin D, Ergocalciferol, (DRISDOL) 50000 units CAPS capsule     . warfarin (COUMADIN) 3 MG tablet TAKE ONE AND ONE-HALF TO TWO TABLETS DAILY AS DIRECTED BY COUMADIN CLINIC 180 tablet 1   No current facility-administered medications on file prior to visit.     Past Medical History:  Diagnosis Date  . Arthritis   . Asthma   . BPH (benign prostatic hypertrophy)   . CAD (coronary artery disease)    a. 07/2013: s/p DES to LAD, normal LVF.  [name] Complication of anesthesia    "I got all kinds of hallucinations"  . COPD (chronic obstructive pulmonary disease) ([place])    a. 07/2013 PFT's mild airflow obstruction, no restriction, sev decrease in DLCO.  . Diverticulosis   . DVT (deep venous thrombosis) ([place])    a. 2010 Lower ext s/p back surgery.  [name] Dyspnea on exertion    a. 07/2013 PFT's mild airflow obstr   . GERD (gastroesophageal reflux disease)   . History of gout   . History of hiatal hernia   . Hx of echocardiogram 2015   Echo (06/2013): EF 60-65% normal wall motion, normal diastolic function, aortic sclerosis without stenosis, Trivial MR, mild SAM due to long, redundant mitral leaflets, mild RAE, normal RVSF  . Hypertension   . Macrocytic anemia    a. 07/2013: documented on prior labs.  . Obesity   . OSA (obstructive sleep apnea) 11/14/2015  . PAF (paroxysmal atrial fibrillation) ([place])    a. Flecainide discontinued 07/2013 in setting of CAD.;  b. s/p PVI Ablation at [facility] (Dr Joseph Berkshire) 11/2014  . Pneumonia ~ 2010 X 1  . Pulmonary embolism ([place])    a. 2010 in setting of DVT post-op back surgery. b. Low prob VQ 07/2013.  [name] Spinal stenosis    Congential  . Transaminitis    a. 07/2013: mild.  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 ANTERIOR LUMBAR Patrick ARTHROPLASTY  03/2008   "spacer poped out; had to repair"  . CATARACT EXTRACTION W/ INTRAOCULAR LENS  IMPLANT, BILATERAL Bilateral   . COLONOSCOPY W/ POLYPECTOMY  2004  . Colonoscopy with polypectomy  09/2011   2 tubular adenomas  . CORONARY ANGIOPLASTY WITH STENT PLACEMENT  08/08/2013   "1"  . JOINT REPLACEMENT    .  LEFT AND RIGHT HEART CATHETERIZATION WITH CORONARY ANGIOGRAM N/A 08/07/2013   Procedure: LEFT AND RIGHT HEART CATHETERIZATION WITH CORONARY ANGIOGRAM;  Surgeon: Peter M Martinique, MD;  Location: [facility] CATH LAB;  Service: Cardiovascular;  Laterality: N/A;  . LUMBAR FUSION  02/2008  . PERCUTANEOUS CORONARY STENT INTERVENTION (PCI-S)  08/07/2013   Procedure: PERCUTANEOUS CORONARY STENT INTERVENTION (PCI-S);  Surgeon: Peter M Martinique, MD;  Location: [facility] CATH LAB;  Service: Cardiovascular;;  . PVI ablation  11/2014   Dr. Venita Sheffield [facility]  . TOTAL HIP ARTHROPLASTY Bilateral   . VASECTOMY      Social History   Socioeconomic History  . Marital status: Married    Spouse name: Not on file  . Number of children: 4  . Years of education: Not on file  . Highest education level: Not on file  Occupational History  . Occupation: Retired from [employer]: RETIRED  Social Needs  . Financial resource strain: Not hard at all  . Food insecurity:    Worry: Never true    Inability: Never true  . Transportation needs:    Medical: No    Non-medical: No  Tobacco Use  . Smoking status: Former Smoker    Packs/day: 2.00    Years: 40.00    Pack years: 80.00    Types: Cigarettes    Last attempt to quit:  03/09/2000    Years since quitting: 17.4  . Smokeless tobacco: Never Used  . Tobacco comment: smoked 1964-2002, up to 3 ppd  Substance and Sexual Activity  . Alcohol use: Yes    Alcohol/week: 1.2 - 1.8 oz    Types: 2 - 3 Cans of beer per week    Comment: drinks beer several times weekly  . Drug use: No  . Sexual activity: Never    Comment: "sexually hx is none of your business" (02/08/2014)  Lifestyle  . Physical activity:    Days per week: 0 days    Minutes per session: 0 min  . Stress: Only a little  Relationships  . Social connections:    Talks on phone: More than three times a week    Gets together: More than three times a week    Attends religious service: Not on file    Active member of club or organization: Not on file    Attends meetings of clubs or organizations: Not on file    Relationship status: Married  Other Topics Concern  . Not on file  Social History Narrative  . Not on file    Family History  Problem Relation Age of Onset  . Heart attack Paternal Grandfather        Over 10  . Atrial fibrillation Mother   . COPD Mother   . Heart disease Father   . Colon cancer Father   . Heart attack Brother 63  . Alcohol abuse Brother 14       Died post liver transplant  . Heart attack Paternal Uncle        Less than 75  . Stroke Neg Hx     Review of Systems  Constitutional: Negative for chills and fever.  Respiratory: Positive for cough (chronic), shortness of breath (chronic) and wheezing (chronic).   Cardiovascular: Positive for leg swelling (occ, mild in left ankle). Negative for chest pain and palpitations.  Gastrointestinal:       Jerrye Bushy controlled  Neurological: Negative for light-headedness and headaches.  Psychiatric/Behavioral: Positive for sleep disturbance. Negative for  dysphoric mood. The patient is not nervous/anxious.        Objective:   Vitals:   08/24/17 1409  BP: 136/84  Pulse: 80  Resp: 16  Temp: 97.8 F (36.6 C)  SpO2: 97%   BP  Readings from Last 3 Encounters:  08/24/17 136/84  07/30/17 (!) 150/62  04/20/17 108/66   Wt Readings from Last 3 Encounters:  08/24/17 270 lb (122.5 kg)  07/30/17 269 lb 6.4 oz (122.2 kg)  04/20/17 272 lb (123.4 kg)   Body mass index is 34.67 kg/m.   Physical Exam    Constitutional: Appears well-developed and well-nourished. No distress.  HENT:  Head: Normocephalic and atraumatic.  Neck: Neck supple. No tracheal deviation present. No thyromegaly present.  No cervical lymphadenopathy Cardiovascular: Normal rate, regular rhythm and normal heart sounds.   No murmur heard. No carotid bruit .  No edema Pulmonary/Chest: Effort normal and breath sounds normal. No respiratory distress. No has no wheezes. No rales.  Skin: Skin is warm and dry. Not diaphoretic.  Psychiatric: Normal mood and affect. Behavior is normal.      Assessment & Plan:    See Problem List for Assessment and Plan of chronic medical problems.

## 2017-08-24 NOTE — Assessment & Plan Note (Signed)
BP well controlled Current regimen effective and well tolerated Continue current medications at current doses cmp  

## 2017-08-24 NOTE — Assessment & Plan Note (Addendum)
Trazodone not effective Has taken his wife's clonazepam 1 mg at night and it works Discussed trying lunesta vs continuing with clonazepam Trial of lunesta 3 mg nightly

## 2017-08-24 NOTE — Assessment & Plan Note (Signed)
Check lipid panel  Continue daily statin Regular exercise and healthy diet encouraged  

## 2017-08-24 NOTE — Patient Instructions (Addendum)
  Test(s) ordered today. Your results will be released to [place] (or called to you) after review, usually within 72hours after test completion. If any changes need to be made, you will be notified at that same time.   Medications reviewed and updated.  Changes include a trial of lunesta.  Your prescription(s) have been submitted to your pharmacy. Please take as directed and contact our office if you believe you are having problem(s) with the medication(s).    Please followup in 6 months

## 2017-08-24 NOTE — Assessment & Plan Note (Signed)
No chest pain, but chronic fatigue and SOB with exertion Doing some exercise - encouraged increasing his exercise Continue current medications Following with cardiology

## 2017-08-24 NOTE — Assessment & Plan Note (Signed)
Controlled, stable Continue current dose of medication  

## 2017-08-24 NOTE — Assessment & Plan Note (Signed)
GERD controlled Continue daily medication  

## 2017-08-24 NOTE — Assessment & Plan Note (Signed)
Check a1c Low sugar / carb diet Stressed regular exercise, weight loss  

## 2017-08-25 ENCOUNTER — Encounter: Payer: Self-pay | Admitting: Internal Medicine

## 2017-09-01 ENCOUNTER — Ambulatory Visit: Payer: Medicare Other | Admitting: Internal Medicine

## 2017-09-13 ENCOUNTER — Ambulatory Visit (INDEPENDENT_AMBULATORY_CARE_PROVIDER_SITE_OTHER): Payer: Medicare Other | Admitting: Pharmacist Clinician (PhC)/ Clinical Pharmacy Specialist

## 2017-09-13 DIAGNOSIS — I480 Paroxysmal atrial fibrillation: Secondary | ICD-10-CM

## 2017-09-13 DIAGNOSIS — Z7901 Long term (current) use of anticoagulants: Secondary | ICD-10-CM

## 2017-09-13 DIAGNOSIS — Z86718 Personal history of other venous thrombosis and embolism: Secondary | ICD-10-CM | POA: Diagnosis not present

## 2017-09-13 LAB — POCT INR: INR: 2.3 (ref 2.0–3.0)

## 2017-09-17 ENCOUNTER — Other Ambulatory Visit: Payer: Self-pay | Admitting: Internal Medicine

## 2017-09-24 ENCOUNTER — Ambulatory Visit: Payer: Medicare Other | Admitting: Internal Medicine

## 2017-10-11 ENCOUNTER — Other Ambulatory Visit: Payer: Self-pay | Admitting: Internal Medicine

## 2017-10-11 ENCOUNTER — Ambulatory Visit (INDEPENDENT_AMBULATORY_CARE_PROVIDER_SITE_OTHER): Payer: Medicare Other | Admitting: Pharmacist

## 2017-10-11 DIAGNOSIS — Z86718 Personal history of other venous thrombosis and embolism: Secondary | ICD-10-CM | POA: Diagnosis not present

## 2017-10-11 DIAGNOSIS — I480 Paroxysmal atrial fibrillation: Secondary | ICD-10-CM | POA: Diagnosis not present

## 2017-10-11 DIAGNOSIS — Z7901 Long term (current) use of anticoagulants: Secondary | ICD-10-CM

## 2017-10-11 LAB — POCT INR: INR: 2.4 (ref 2.0–3.0)

## 2017-10-11 NOTE — Telephone Encounter (Signed)
[place] Controlled Substance Database checked. Last filled on 08/24/17

## 2017-10-16 NOTE — Progress Notes (Signed)
HPI  The patient presents for follow up of atrial fibrillation, HTN, prior DVT and pulmonary emboli and CAD.   He has received some of his care at [facility] atrial fib ablation. Follow up EP evaluation did have demonstrate that he had residual short paroxysms of fibrillation on a monitor. Since I last saw him he has done well.  He has probably had one 40 min episode of atrial fib.  He has had no syncope or pain with this.  He has continued DOE.  The patient denies any new symptoms such as chest discomfort, neck or arm discomfort. There has been no new  PND or orthopnea.     Allergies  Allergen Reactions  . Penicillins Anaphylaxis     Because of a history of documented adverse serious drug reaction;Medi Alert bracelet  is recommended    Current Outpatient Medications  Medication Sig Dispense Refill  . amLODipine (NORVASC) 5 MG tablet TAKE 1 TABLET DAILY 90 tablet 1  . aspirin 81 MG chewable tablet Chew 1 tablet by mouth daily.    [name] atorvastatin (LIPITOR) 10 MG tablet TAKE 1 TABLET EVERY EVENING 90 tablet 3  . Eszopiclone 3 MG TABS TAKE 1 TABLET BY MOUTH IMMEDIATELY BEFORE BEDTIME 30 tablet 0  . Fluticasone-Umeclidin-Vilant (TRELEGY ELLIPTA) 100-62.5-25 MCG/INH AEPB Inhale 1 puff into the lungs daily. 180 each 1  . metFORMIN (GLUCOPHAGE) 500 MG tablet Take 1 tablet (500 mg total) by mouth 2 (two) times daily with a meal. 180 tablet 1  . nitroGLYCERIN (NITROSTAT) 0.4 MG SL tablet Place 1 tablet (0.4 mg total) under the tongue every 5 (five) minutes as needed for chest pain (up to 3 doses). 25 tablet 4  . pantoprazole (PROTONIX) 40 MG tablet TAKE 1 TABLET DAILY 90 tablet 3  . venlafaxine XR (EFFEXOR XR) 75 MG 24 hr capsule Take 1 capsule (75 mg total) by mouth daily with breakfast. 90 capsule 3  . vitamin B-12 (CYANOCOBALAMIN) 1000 MCG tablet Take 1,000 mcg by mouth daily.    . Vitamin D, Ergocalciferol, (DRISDOL) 50000 units CAPS capsule     . warfarin (COUMADIN) 3 MG  tablet TAKE ONE AND ONE-HALF TO TWO TABLETS DAILY AS DIRECTED BY COUMADIN CLINIC 180 tablet 1   No current facility-administered medications for this visit.     Past Medical History:  Diagnosis Date  . Arthritis   . Asthma   . BPH (benign prostatic hypertrophy)   . CAD (coronary artery disease)    a. 07/2013: s/p DES to LAD, normal LVF.  [name] Complication of anesthesia    "I got all kinds of hallucinations"  . COPD (chronic obstructive pulmonary disease) ([place])    a. 07/2013 PFT's mild airflow obstruction, no restriction, sev decrease in DLCO.  . Diverticulosis   . DVT (deep venous thrombosis) ([place])    a. 2010 Lower ext s/p back surgery.  [name] Dyspnea on exertion    a. 07/2013 PFT's mild airflow obstr   . GERD (gastroesophageal reflux disease)   . History of gout   . History of hiatal hernia   . Hx of echocardiogram 2015   Echo (06/2013): EF 60-65% normal wall motion, normal diastolic function, aortic sclerosis without stenosis, Trivial MR, mild SAM due to long, redundant mitral leaflets, mild RAE, normal RVSF  . Hypertension   . Macrocytic anemia    a. 07/2013: documented on prior labs.  . Obesity   . OSA (obstructive sleep apnea) 11/14/2015  . PAF (paroxysmal  atrial fibrillation) ([place])    a. Flecainide discontinued 07/2013 in setting of CAD.; b. s/p PVI Ablation at [facility] (Dr Joseph Berkshire) 11/2014  . Pneumonia ~ 2010 X 1  . Pulmonary embolism ([place])    a. 2010 in setting of DVT post-op back surgery. b. Low prob VQ 07/2013.  [name] Spinal stenosis    Congential  . Transaminitis    a. 07/2013: mild.  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 ANTERIOR LUMBAR [place] ARTHROPLASTY  03/2008   "spacer poped out; had to repair"  . CATARACT EXTRACTION W/ INTRAOCULAR LENS  IMPLANT, BILATERAL Bilateral   . COLONOSCOPY W/ POLYPECTOMY  2004  . Colonoscopy with polypectomy  09/2011   2 tubular adenomas  . CORONARY ANGIOPLASTY WITH STENT PLACEMENT   08/08/2013   "1"  . JOINT REPLACEMENT    . LEFT AND RIGHT HEART CATHETERIZATION WITH CORONARY ANGIOGRAM N/A 08/07/2013   Procedure: LEFT AND RIGHT HEART CATHETERIZATION WITH CORONARY ANGIOGRAM;  Surgeon: Peter M Martinique, MD;  Location: [facility] CATH LAB;  Service: Cardiovascular;  Laterality: N/A;  . LUMBAR FUSION  02/2008  . PERCUTANEOUS CORONARY STENT INTERVENTION (PCI-S)  08/07/2013   Procedure: PERCUTANEOUS CORONARY STENT INTERVENTION (PCI-S);  Surgeon: Peter M Martinique, MD;  Location: [facility] CATH LAB;  Service: Cardiovascular;;  . PVI ablation  11/2014   Dr. Venita Sheffield [facility]  . TOTAL HIP ARTHROPLASTY Bilateral   . VASECTOMY      ROS:  As stated in the HPI and negative for all other systems.  PHYSICAL EXAM BP (!) 146/85   Pulse 74   Ht 6\' 2"  (1.88 m)   Wt 268 lb 12.8 oz (121.9 kg)   SpO2 95%   BMI 34.51 kg/m   GENERAL:  Well appearing NECK:  No jugular venous distention, waveform within normal limits, carotid upstroke brisk and symmetric, no bruits, no thyromegaly LUNGS:  Clear to auscultation bilaterally CHEST:  Unremarkable HEART:  PMI not displaced or sustained,S1 and S2 within normal limits, no S3, no S4, no clicks, no rubs, no murmurs ABD:  Flat, positive bowel sounds normal in frequency in pitch, no bruits, no rebound, no guarding, no midline pulsatile mass, no hepatomegaly, no splenomegaly EXT:  2 plus pulses throughout, no edema, no cyanosis no clubbing   EKG: NA  ASSESSMENT AND PLAN  ATRIAL FIBRILLATION:   [name] has a CHA2DS2 - VASc score of 3.  He will continue with this therapy.   DYSPNEA:   The etiology has been unclear without evidence for heart failure.  He is followed by Dr. Lake Bells for COPD.  He has had an extensive work up.  I do suspect some element of deconditioning and weight.   AAA:     This was 3.1.   No further imaging at this time.   CAD:  He had negative stress test in 2018.  He has had no change in symptoms since this study.  No  further work up.   HTN:   The blood pressure is at target.  No change in therapy.  MR:        This was not significant MR on his echo done at the [facility] in 2018.   I will follow this clinically.   SLEEP APNEA:  Follow up per Dr. Halford Chessman.   He is finding some improvement in symptoms with the this and is sleeping better although he does not like it.

## 2017-10-18 ENCOUNTER — Ambulatory Visit (INDEPENDENT_AMBULATORY_CARE_PROVIDER_SITE_OTHER): Payer: Medicare Other | Admitting: Cardiology

## 2017-10-18 ENCOUNTER — Encounter: Payer: Self-pay | Admitting: Cardiology

## 2017-10-18 VITALS — BP 146/85 | HR 74 | Ht 74.0 in | Wt 268.8 lb

## 2017-10-18 DIAGNOSIS — R0602 Shortness of breath: Secondary | ICD-10-CM

## 2017-10-18 DIAGNOSIS — I2510 Atherosclerotic heart disease of native coronary artery without angina pectoris: Secondary | ICD-10-CM

## 2017-10-18 DIAGNOSIS — I480 Paroxysmal atrial fibrillation: Secondary | ICD-10-CM

## 2017-10-18 NOTE — Patient Instructions (Signed)
Medication Instructions:  Continue current medications  If you need a refill on your cardiac medications before your next appointment, please call your pharmacy.  Labwork: None Ordered   Testing/Procedures: None Ordered  Follow-Up: Your physician wants you to follow-up in: 1 Year. You should receive a reminder letter in the mail two months in advance. If you do not receive a letter, please call our office [phone].    Thank you for choosing [facility]!!      

## 2017-10-28 ENCOUNTER — Ambulatory Visit: Payer: Medicare Other | Admitting: Cardiology

## 2017-11-09 ENCOUNTER — Other Ambulatory Visit: Payer: Self-pay | Admitting: Internal Medicine

## 2017-11-10 ENCOUNTER — Other Ambulatory Visit: Payer: Self-pay | Admitting: Cardiology

## 2017-11-10 NOTE — Telephone Encounter (Signed)
Rx request sent to pharmacy.  

## 2017-11-22 ENCOUNTER — Ambulatory Visit (INDEPENDENT_AMBULATORY_CARE_PROVIDER_SITE_OTHER): Payer: Medicare Other | Admitting: Pharmacist

## 2017-11-22 DIAGNOSIS — Z7901 Long term (current) use of anticoagulants: Secondary | ICD-10-CM | POA: Diagnosis not present

## 2017-11-22 DIAGNOSIS — I480 Paroxysmal atrial fibrillation: Secondary | ICD-10-CM

## 2017-11-22 DIAGNOSIS — Z86718 Personal history of other venous thrombosis and embolism: Secondary | ICD-10-CM

## 2017-11-22 LAB — POCT INR: INR: 2.3 (ref 2.0–3.0)

## 2017-12-10 ENCOUNTER — Other Ambulatory Visit: Payer: Self-pay | Admitting: Internal Medicine

## 2017-12-10 NOTE — Telephone Encounter (Signed)
MD approved and sent electronically to pof../lmb  

## 2017-12-10 NOTE — Telephone Encounter (Signed)
Check [name] registry last filled 10/12/2017.Marland KitchenJohny [name]

## 2017-12-20 ENCOUNTER — Other Ambulatory Visit: Payer: Self-pay | Admitting: Cardiology

## 2017-12-21 ENCOUNTER — Ambulatory Visit (INDEPENDENT_AMBULATORY_CARE_PROVIDER_SITE_OTHER): Payer: Medicare Other

## 2017-12-21 DIAGNOSIS — Z23 Encounter for immunization: Secondary | ICD-10-CM

## 2017-12-29 ENCOUNTER — Telehealth: Payer: Self-pay

## 2017-12-29 NOTE — Telephone Encounter (Signed)
   [facility] Pre-operative Risk Assessment    Request for surgical clearance:  1. What type of surgery is being performed?  One small area (between 2 teeth)   2. When is this surgery scheduled? TBD   3. What type of clearance is required (medical clearance vs. Pharmacy clearance to hold med vs. Both)? Pharmacy  4. Are there any medications that need to be held prior to surgery and how long? Coumadin - they want to know if it is okay to leave patient on the Coumadin.   5. Practice name and name of physician performing surgery? [facility],    6. What is your office phone number [phone]    7.   What is your office fax number  [phone]  8.   Anesthesia type (None, local, MAC, general) ?    Ewell Poe Ingalls 12/29/2017, 1:55 PM  _______________________________________________________________ Hulen Skains and left message at Periodontics office to elaborate on procedure, name of doctor, and anesthesia type.

## 2017-12-30 ENCOUNTER — Encounter: Payer: Self-pay | Admitting: Internal Medicine

## 2017-12-30 NOTE — Telephone Encounter (Signed)
Ok to leave patient on Coumadin for minor dental procedure or single tooth extraction.  Kerin Ransom PA-C 12/30/2017 4:13 PM

## 2017-12-30 NOTE — Telephone Encounter (Signed)
Need more information aside from "one small area" for procedure details, however it is likely that he will be able to continue warfarin for minor dental work.

## 2018-01-03 ENCOUNTER — Ambulatory Visit (INDEPENDENT_AMBULATORY_CARE_PROVIDER_SITE_OTHER): Payer: Medicare Other | Admitting: Pharmacist

## 2018-01-03 DIAGNOSIS — Z7901 Long term (current) use of anticoagulants: Secondary | ICD-10-CM

## 2018-01-03 DIAGNOSIS — I480 Paroxysmal atrial fibrillation: Secondary | ICD-10-CM

## 2018-01-03 DIAGNOSIS — Z86718 Personal history of other venous thrombosis and embolism: Secondary | ICD-10-CM

## 2018-01-03 LAB — POCT INR: INR: 1.8 — AB (ref 2.0–3.0)

## 2018-01-04 MED ORDER — SUVOREXANT 10 MG PO TABS: 10.0000 mg | ORAL_TABLET | Freq: Every day | ORAL | 2 refills | Status: DC

## 2018-01-04 NOTE — Addendum Note (Signed)
Addended by: Binnie Rail on: 01/04/2018 08:09 AM   Modules accepted: Orders

## 2018-01-14 ENCOUNTER — Other Ambulatory Visit: Payer: Self-pay | Admitting: Cardiology

## 2018-01-28 DIAGNOSIS — M5030 Other cervical disc degeneration, unspecified cervical region: Secondary | ICD-10-CM | POA: Diagnosis not present

## 2018-02-01 ENCOUNTER — Other Ambulatory Visit: Payer: Self-pay | Admitting: Family Medicine

## 2018-02-07 ENCOUNTER — Other Ambulatory Visit: Payer: Self-pay | Admitting: Internal Medicine

## 2018-02-14 ENCOUNTER — Ambulatory Visit (INDEPENDENT_AMBULATORY_CARE_PROVIDER_SITE_OTHER): Payer: Medicare Other | Admitting: Pharmacist Clinician (PhC)/ Clinical Pharmacy Specialist

## 2018-02-14 DIAGNOSIS — Z7901 Long term (current) use of anticoagulants: Secondary | ICD-10-CM | POA: Diagnosis not present

## 2018-02-14 DIAGNOSIS — Z86718 Personal history of other venous thrombosis and embolism: Secondary | ICD-10-CM

## 2018-02-14 DIAGNOSIS — I480 Paroxysmal atrial fibrillation: Secondary | ICD-10-CM | POA: Diagnosis not present

## 2018-02-14 LAB — POCT INR: INR: 1.8 — AB (ref 2.0–3.0)

## 2018-02-19 ENCOUNTER — Other Ambulatory Visit: Payer: Self-pay | Admitting: Internal Medicine

## 2018-02-21 NOTE — Telephone Encounter (Signed)
Last OV was 08/24/17 Next OV is not listed Last refill was 12/10/17

## 2018-03-02 ENCOUNTER — Other Ambulatory Visit: Payer: Self-pay | Admitting: Internal Medicine

## 2018-03-08 DIAGNOSIS — I10 Essential (primary) hypertension: Secondary | ICD-10-CM | POA: Diagnosis not present

## 2018-03-08 DIAGNOSIS — Z0389 Encounter for observation for other suspected diseases and conditions ruled out: Secondary | ICD-10-CM | POA: Diagnosis not present

## 2018-03-08 DIAGNOSIS — J45909 Unspecified asthma, uncomplicated: Secondary | ICD-10-CM | POA: Diagnosis not present

## 2018-03-08 DIAGNOSIS — Z96649 Presence of unspecified artificial hip joint: Secondary | ICD-10-CM | POA: Diagnosis not present

## 2018-03-08 DIAGNOSIS — E119 Type 2 diabetes mellitus without complications: Secondary | ICD-10-CM | POA: Diagnosis not present

## 2018-03-08 DIAGNOSIS — E782 Mixed hyperlipidemia: Secondary | ICD-10-CM | POA: Diagnosis not present

## 2018-03-08 DIAGNOSIS — Z7982 Long term (current) use of aspirin: Secondary | ICD-10-CM | POA: Diagnosis not present

## 2018-03-08 DIAGNOSIS — E785 Hyperlipidemia, unspecified: Secondary | ICD-10-CM | POA: Diagnosis not present

## 2018-03-08 DIAGNOSIS — Z7902 Long term (current) use of antithrombotics/antiplatelets: Secondary | ICD-10-CM | POA: Diagnosis not present

## 2018-03-08 DIAGNOSIS — J069 Acute upper respiratory infection, unspecified: Secondary | ICD-10-CM | POA: Diagnosis not present

## 2018-03-08 DIAGNOSIS — Z79899 Other long term (current) drug therapy: Secondary | ICD-10-CM | POA: Diagnosis not present

## 2018-03-08 DIAGNOSIS — R0602 Shortness of breath: Secondary | ICD-10-CM | POA: Diagnosis not present

## 2018-03-08 DIAGNOSIS — Z9229 Personal history of other drug therapy: Secondary | ICD-10-CM | POA: Diagnosis not present

## 2018-03-08 DIAGNOSIS — I480 Paroxysmal atrial fibrillation: Secondary | ICD-10-CM | POA: Diagnosis not present

## 2018-03-08 DIAGNOSIS — I2510 Atherosclerotic heart disease of native coronary artery without angina pectoris: Secondary | ICD-10-CM | POA: Diagnosis not present

## 2018-03-08 DIAGNOSIS — J441 Chronic obstructive pulmonary disease with (acute) exacerbation: Secondary | ICD-10-CM | POA: Diagnosis not present

## 2018-03-08 DIAGNOSIS — Z7951 Long term (current) use of inhaled steroids: Secondary | ICD-10-CM | POA: Diagnosis not present

## 2018-03-09 DIAGNOSIS — Z79899 Other long term (current) drug therapy: Secondary | ICD-10-CM | POA: Diagnosis not present

## 2018-03-09 DIAGNOSIS — J45909 Unspecified asthma, uncomplicated: Secondary | ICD-10-CM | POA: Diagnosis not present

## 2018-03-09 DIAGNOSIS — J069 Acute upper respiratory infection, unspecified: Secondary | ICD-10-CM | POA: Diagnosis not present

## 2018-03-09 DIAGNOSIS — Z9229 Personal history of other drug therapy: Secondary | ICD-10-CM | POA: Diagnosis not present

## 2018-03-09 DIAGNOSIS — I480 Paroxysmal atrial fibrillation: Secondary | ICD-10-CM | POA: Diagnosis not present

## 2018-03-09 DIAGNOSIS — I2510 Atherosclerotic heart disease of native coronary artery without angina pectoris: Secondary | ICD-10-CM | POA: Diagnosis not present

## 2018-03-09 DIAGNOSIS — Z7982 Long term (current) use of aspirin: Secondary | ICD-10-CM | POA: Diagnosis not present

## 2018-03-09 DIAGNOSIS — Z7951 Long term (current) use of inhaled steroids: Secondary | ICD-10-CM | POA: Diagnosis not present

## 2018-03-09 DIAGNOSIS — I10 Essential (primary) hypertension: Secondary | ICD-10-CM | POA: Diagnosis not present

## 2018-03-11 DIAGNOSIS — Z7901 Long term (current) use of anticoagulants: Secondary | ICD-10-CM | POA: Diagnosis not present

## 2018-03-11 DIAGNOSIS — E782 Mixed hyperlipidemia: Secondary | ICD-10-CM | POA: Diagnosis not present

## 2018-03-11 DIAGNOSIS — Z981 Arthrodesis status: Secondary | ICD-10-CM | POA: Diagnosis not present

## 2018-03-11 DIAGNOSIS — Z7902 Long term (current) use of antithrombotics/antiplatelets: Secondary | ICD-10-CM | POA: Diagnosis not present

## 2018-03-11 DIAGNOSIS — B9789 Other viral agents as the cause of diseases classified elsewhere: Secondary | ICD-10-CM | POA: Diagnosis not present

## 2018-03-11 DIAGNOSIS — Z7984 Long term (current) use of oral hypoglycemic drugs: Secondary | ICD-10-CM | POA: Diagnosis not present

## 2018-03-11 DIAGNOSIS — J441 Chronic obstructive pulmonary disease with (acute) exacerbation: Secondary | ICD-10-CM | POA: Diagnosis not present

## 2018-03-11 DIAGNOSIS — T380X5A Adverse effect of glucocorticoids and synthetic analogues, initial encounter: Secondary | ICD-10-CM | POA: Diagnosis not present

## 2018-03-11 DIAGNOSIS — E119 Type 2 diabetes mellitus without complications: Secondary | ICD-10-CM | POA: Diagnosis not present

## 2018-03-11 DIAGNOSIS — Z7982 Long term (current) use of aspirin: Secondary | ICD-10-CM | POA: Diagnosis not present

## 2018-03-11 DIAGNOSIS — E785 Hyperlipidemia, unspecified: Secondary | ICD-10-CM | POA: Diagnosis present

## 2018-03-11 DIAGNOSIS — I2510 Atherosclerotic heart disease of native coronary artery without angina pectoris: Secondary | ICD-10-CM | POA: Diagnosis not present

## 2018-03-11 DIAGNOSIS — Z96649 Presence of unspecified artificial hip joint: Secondary | ICD-10-CM | POA: Diagnosis present

## 2018-03-11 DIAGNOSIS — Z955 Presence of coronary angioplasty implant and graft: Secondary | ICD-10-CM | POA: Diagnosis not present

## 2018-03-11 DIAGNOSIS — D539 Nutritional anemia, unspecified: Secondary | ICD-10-CM | POA: Diagnosis not present

## 2018-03-11 DIAGNOSIS — I480 Paroxysmal atrial fibrillation: Secondary | ICD-10-CM | POA: Diagnosis not present

## 2018-03-11 DIAGNOSIS — I10 Essential (primary) hypertension: Secondary | ICD-10-CM | POA: Diagnosis not present

## 2018-03-11 DIAGNOSIS — I739 Peripheral vascular disease, unspecified: Secondary | ICD-10-CM | POA: Diagnosis present

## 2018-03-11 DIAGNOSIS — R5381 Other malaise: Secondary | ICD-10-CM | POA: Diagnosis not present

## 2018-03-11 DIAGNOSIS — Z86711 Personal history of pulmonary embolism: Secondary | ICD-10-CM | POA: Diagnosis not present

## 2018-03-11 DIAGNOSIS — M109 Gout, unspecified: Secondary | ICD-10-CM | POA: Diagnosis present

## 2018-03-11 DIAGNOSIS — Z87891 Personal history of nicotine dependence: Secondary | ICD-10-CM | POA: Diagnosis not present

## 2018-03-11 DIAGNOSIS — J069 Acute upper respiratory infection, unspecified: Secondary | ICD-10-CM | POA: Diagnosis not present

## 2018-03-14 MED ORDER — IPRATROPIUM-ALBUTEROL 0.5-2.5 (3) MG/3ML IN SOLN
3.00 | RESPIRATORY_TRACT | Status: DC
Start: ? — End: 2018-03-14

## 2018-03-14 MED ORDER — GENERIC EXTERNAL MEDICATION
6.00 | Status: DC
Start: 2018-03-14 — End: 2018-03-14

## 2018-03-14 MED ORDER — AMLODIPINE BESYLATE 5 MG PO TABS
5.00 | ORAL_TABLET | ORAL | Status: DC
Start: 2018-03-14 — End: 2018-03-14

## 2018-03-14 MED ORDER — ATORVASTATIN CALCIUM 10 MG PO TABS
10.00 | ORAL_TABLET | ORAL | Status: DC
Start: 2018-03-14 — End: 2018-03-14

## 2018-03-14 MED ORDER — ASPIRIN EC 81 MG PO TBEC
81.00 | DELAYED_RELEASE_TABLET | ORAL | Status: DC
Start: 2018-03-14 — End: 2018-03-14

## 2018-03-14 MED ORDER — AZITHROMYCIN 500 MG PO TABS
500.00 | ORAL_TABLET | ORAL | Status: DC
Start: 2018-03-14 — End: 2018-03-14

## 2018-03-14 MED ORDER — INSULIN LISPRO 100 UNIT/ML ~~LOC~~ SOLN
SUBCUTANEOUS | Status: DC
Start: 2018-03-13 — End: 2018-03-14

## 2018-03-14 MED ORDER — GENERIC EXTERNAL MEDICATION
Status: DC
Start: ? — End: 2018-03-14

## 2018-03-14 MED ORDER — GABAPENTIN 300 MG PO CAPS
300.00 | ORAL_CAPSULE | ORAL | Status: DC
Start: 2018-03-14 — End: 2018-03-14

## 2018-03-14 MED ORDER — PANTOPRAZOLE SODIUM 40 MG PO TBEC
40.00 | DELAYED_RELEASE_TABLET | ORAL | Status: DC
Start: 2018-03-14 — End: 2018-03-14

## 2018-03-14 MED ORDER — GENERIC EXTERNAL MEDICATION
15.00 | Status: DC
Start: ? — End: 2018-03-14

## 2018-03-14 MED ORDER — PREDNISONE 20 MG PO TABS
40.00 | ORAL_TABLET | ORAL | Status: DC
Start: 2018-03-14 — End: 2018-03-14

## 2018-03-14 MED ORDER — INSULIN LISPRO 100 UNIT/ML ~~LOC~~ SOLN
SUBCUTANEOUS | Status: DC
Start: 2018-03-14 — End: 2018-03-14

## 2018-03-14 MED ORDER — TRAZODONE HCL 50 MG PO TABS
50.00 | ORAL_TABLET | ORAL | Status: DC
Start: 2018-03-13 — End: 2018-03-14

## 2018-03-14 MED ORDER — LORATADINE 10 MG PO TABS
10.00 | ORAL_TABLET | ORAL | Status: DC
Start: ? — End: 2018-03-14

## 2018-03-16 DIAGNOSIS — I2510 Atherosclerotic heart disease of native coronary artery without angina pectoris: Secondary | ICD-10-CM | POA: Diagnosis not present

## 2018-03-16 DIAGNOSIS — R0602 Shortness of breath: Secondary | ICD-10-CM | POA: Diagnosis not present

## 2018-03-16 DIAGNOSIS — I480 Paroxysmal atrial fibrillation: Secondary | ICD-10-CM | POA: Diagnosis not present

## 2018-03-18 DIAGNOSIS — I2782 Chronic pulmonary embolism: Secondary | ICD-10-CM | POA: Diagnosis not present

## 2018-03-18 DIAGNOSIS — I480 Paroxysmal atrial fibrillation: Secondary | ICD-10-CM | POA: Diagnosis not present

## 2018-03-18 DIAGNOSIS — Z9889 Other specified postprocedural states: Secondary | ICD-10-CM | POA: Diagnosis not present

## 2018-03-18 DIAGNOSIS — E6609 Other obesity due to excess calories: Secondary | ICD-10-CM | POA: Diagnosis not present

## 2018-03-18 DIAGNOSIS — Z8679 Personal history of other diseases of the circulatory system: Secondary | ICD-10-CM | POA: Diagnosis not present

## 2018-03-18 DIAGNOSIS — R001 Bradycardia, unspecified: Secondary | ICD-10-CM | POA: Diagnosis not present

## 2018-03-18 DIAGNOSIS — J449 Chronic obstructive pulmonary disease, unspecified: Secondary | ICD-10-CM | POA: Diagnosis not present

## 2018-03-18 DIAGNOSIS — I2510 Atherosclerotic heart disease of native coronary artery without angina pectoris: Secondary | ICD-10-CM | POA: Diagnosis not present

## 2018-03-18 DIAGNOSIS — E782 Mixed hyperlipidemia: Secondary | ICD-10-CM | POA: Diagnosis not present

## 2018-03-18 DIAGNOSIS — Z7901 Long term (current) use of anticoagulants: Secondary | ICD-10-CM | POA: Diagnosis not present

## 2018-03-18 DIAGNOSIS — I10 Essential (primary) hypertension: Secondary | ICD-10-CM | POA: Diagnosis not present

## 2018-03-18 DIAGNOSIS — Z955 Presence of coronary angioplasty implant and graft: Secondary | ICD-10-CM | POA: Diagnosis not present

## 2018-03-20 ENCOUNTER — Other Ambulatory Visit: Payer: Self-pay | Admitting: Cardiology

## 2018-03-24 DIAGNOSIS — J410 Simple chronic bronchitis: Secondary | ICD-10-CM | POA: Diagnosis not present

## 2018-03-24 DIAGNOSIS — J45901 Unspecified asthma with (acute) exacerbation: Secondary | ICD-10-CM | POA: Diagnosis not present

## 2018-03-24 DIAGNOSIS — R0600 Dyspnea, unspecified: Secondary | ICD-10-CM | POA: Diagnosis not present

## 2018-03-24 DIAGNOSIS — R918 Other nonspecific abnormal finding of lung field: Secondary | ICD-10-CM | POA: Diagnosis not present

## 2018-03-24 DIAGNOSIS — J441 Chronic obstructive pulmonary disease with (acute) exacerbation: Secondary | ICD-10-CM | POA: Diagnosis not present

## 2018-04-12 ENCOUNTER — Ambulatory Visit (INDEPENDENT_AMBULATORY_CARE_PROVIDER_SITE_OTHER): Payer: Medicare Other | Admitting: Pharmacist Clinician (PhC)/ Clinical Pharmacy Specialist

## 2018-04-12 DIAGNOSIS — I480 Paroxysmal atrial fibrillation: Secondary | ICD-10-CM

## 2018-04-12 DIAGNOSIS — Z86718 Personal history of other venous thrombosis and embolism: Secondary | ICD-10-CM

## 2018-04-12 DIAGNOSIS — Z7901 Long term (current) use of anticoagulants: Secondary | ICD-10-CM

## 2018-04-12 LAB — POCT INR: INR: 3.1 — AB (ref 2.0–3.0)

## 2018-05-08 ENCOUNTER — Other Ambulatory Visit: Payer: Self-pay | Admitting: Internal Medicine

## 2018-05-17 ENCOUNTER — Ambulatory Visit (INDEPENDENT_AMBULATORY_CARE_PROVIDER_SITE_OTHER): Payer: Medicare Other | Admitting: Pulmonary Disease

## 2018-05-17 ENCOUNTER — Encounter: Payer: Self-pay | Admitting: Pulmonary Disease

## 2018-05-17 VITALS — BP 124/62 | HR 79 | Ht 74.0 in | Wt 275.8 lb

## 2018-05-17 DIAGNOSIS — G4733 Obstructive sleep apnea (adult) (pediatric): Secondary | ICD-10-CM

## 2018-05-17 DIAGNOSIS — Z9989 Dependence on other enabling machines and devices: Secondary | ICD-10-CM | POA: Diagnosis not present

## 2018-05-17 DIAGNOSIS — J449 Chronic obstructive pulmonary disease, unspecified: Secondary | ICD-10-CM | POA: Diagnosis not present

## 2018-05-17 DIAGNOSIS — R911 Solitary pulmonary nodule: Secondary | ICD-10-CM | POA: Diagnosis not present

## 2018-05-17 MED ORDER — FLUTTER DEVI: 0 refills | Status: DC

## 2018-05-17 NOTE — Progress Notes (Signed)
Subjective:    Patient ID: [name] [name], male    DOB: 1945/09/01, 73 y.o.   MRN: [id number]  Synopsis: COPD, history of coronary artery disease.  History of DVT.  Self-referral in 2018 for dyspnea.  HPI  Chief Complaint  Patient presents with  . Hospitalization Follow-up    COPD, hospital follow -up, feels better    [name] [name] was hospitalized in [place] when he was visiting his family over the holidays.  He says that her grandchild was sick with cough and mucus production.  Several days later he became ill as well.  He was hospitalized at the [facility] clinic overnight and was discharged home on January 1 but then about 2 days later he had to come back for worsening shortness of breath cough and mucus production.  He was hospitalized for another 2 days after that.  Since coming home he says his breathing has improved compared to when he was back there in [place] but he still has cough with chest congestion.  He says he only produces mucus about once a month.  But he feels that there is mucus in his chest.  No recent fevers or chills.  He was seen in the [facility] pulmonary clinic and was advised to use both Flovent as well as Trelegy because of his COPD asthma overlap.  Past Medical History:  Diagnosis Date  . Arthritis   . Asthma   . BPH (benign prostatic hypertrophy)   . CAD (coronary artery disease)    a. 07/2013: s/p DES to LAD, normal LVF.  [name] Complication of anesthesia    "I got all kinds of hallucinations"  . COPD (chronic obstructive pulmonary disease) ([place])    a. 07/2013 PFT's mild airflow obstruction, no restriction, sev decrease in DLCO.  . Diverticulosis   . DVT (deep venous thrombosis) ([place])    a. 2010 Lower ext s/p back surgery.  [name] Dyspnea on exertion    a. 07/2013 PFT's mild airflow obstr   . GERD (gastroesophageal reflux disease)   . History of gout   . History of hiatal hernia   . Hx of echocardiogram 2015   Echo (06/2013): EF 60-65% normal wall motion,  normal diastolic function, aortic sclerosis without stenosis, Trivial MR, mild SAM due to long, redundant mitral leaflets, mild RAE, normal RVSF  . Hypertension   . Macrocytic anemia    a. 07/2013: documented on prior labs.  . Obesity   . OSA (obstructive sleep apnea) 11/14/2015  . PAF (paroxysmal atrial fibrillation) ([place])    a. Flecainide discontinued 07/2013 in setting of CAD.; b. s/p PVI Ablation at [facility] (Dr Joseph Berkshire) 11/2014  . Pneumonia ~ 2010 X 1  . Pulmonary embolism ([place])    a. 2010 in setting of DVT post-op back surgery. b. Low prob VQ 07/2013.  [name] Spinal stenosis    Congential  . Transaminitis    a. 07/2013: mild.  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  HENT: Negative for congestion, dental problem, ear pain, nosebleeds, postnasal drip, rhinorrhea, sinus pressure, sneezing, sore throat and trouble swallowing.   Respiratory: Positive for cough and shortness of breath. Negative for chest tightness and wheezing.   Cardiovascular: Negative for palpitations and leg swelling.       Objective:   Physical Exam  Vitals:   05/17/18 1130  BP: 124/62  Pulse: 79  SpO2: 94%  Weight: 275 lb 12.8  oz (125.1 kg)  Height: 6\' 2"  (1.88 m)   RA  Gen: well appearing HENT: OP clear, TM's clear, neck supple PULM: Rhonchi, some wheezing bilaterally, normal percussion CV: RRR, no mgr, trace edema GI: BS+, soft, nontender Derm: no cyanosis or rash Psyche: normal mood and affect    Pulmonary function test: June 2016 spirometry ratio 69%, FEV1 2.65 L 68% predicted, FVC 3.82 L 75% of predicted November 2018 pulmonary function testing ratio 63%, FEV1 2.81 L 76% predicted, FVC 4.48 L 89% predicted, total lung capacity 7.8 L 99% predicted, residual volume 3.3 L 122% predicted, DLCO 21.27 mL 56% predicted January 2020 [facility] clinic: Ratio 57%, FEV1 2.66 L, 93% predicted, forced vital capacity  4.71 L   Exhaled nitric oxide testing: November 2018 25 ppm, taking inhaled corticosteroid at the time  Chest imaging: June 2016 CT chest images independently reviewed showing mild centrilobular emphysema, subsegmental atelectasis in the right middle lobe, nonspecific interstitial changes (very mild, scant) in the left lower lobe near the periphery October 2018 chest x-ray images independently reviewed showing if his edema bilaterally, mild linear atelectasis in the bases December 2018 CT chest images independently reviewed showing no evidence of interstitial lung disease, normal pulmonary parenchyma seen, aortic calcification noted December 2019 CT angiogram chest [facility] clinic showed no evidence of pulmonary embolism, focal areas of subsegmental peribronchovascular groundglass and consolidative nodular opacities within the right upper lobe and left upper lobe suggestive of active infectious/inflammatory process.  Enlarging mediastinal and hilar lymph nodes, likely reactive.  Worsening inflammatory airway thickening.  Cardiac evaluation: Echocardiogram in April 2018 [facility] clinic normal LV function, normal RV size and function Nuclear stress test [facility] clinic April 2018 showed no evidence of ischemia January 2020 echocardiogram from [facility] clinic showed an LVEF of 52%, right ventricle normal in size mild mitral regurgitation, trace tricuspid valve regurgitation, aortic valve within normal limit  CBC    Component Value Date/Time   WBC 9.9 08/24/2017 1459   RBC 3.22 (L) 08/24/2017 1459   HGB 11.3 (L) 08/24/2017 1459   HCT 33.3 (L) 08/24/2017 1459   PLT 422.0 (H) 08/24/2017 1459   MCV 103.3 (H) 08/24/2017 1459   MCH 33.3 05/22/2014 0012   MCHC 34.0 08/24/2017 1459   RDW 17.0 (H) 08/24/2017 1459   LYMPHSABS 2.4 08/24/2017 1459   MONOABS 0.8 08/24/2017 1459   EOSABS 0.4 08/24/2017 1459   BASOSABS 0.1 08/24/2017 1459   Records from his visit with pulmonology at the [facility]  clinic reviewed from January 2020, he was treated with both Trelegy and Flovent for "inflammation in his lungs" and prednisone was continued for another 1-1/2 weeks after that visit.  There was mention of a cardiopulmonary exercise stress test.  He was hospitalized for an acute exacerbation of COPD in December.  During that time he had a CT scan of his chest which showed some bilateral airway thickening and groundglass opacification in the right and left upper lobes.     Assessment & Plan:     COPD GOLD II  Solitary pulmonary nodule - Plan: CT Chest Wo Contrast  OSA on CPAP  COPD with asthma ([place])  Discussion: Unfortunately Josiah was hospitalized for what sounds like a viral pneumonia back in December 2019, he still has some chest congestion and wheezing on physical exam.  He has been appropriately treated with high doses of inhaled steroids as well as Trelegy, I think that that is a reasonable approach considering his COPD asthma overlap syndrome and high risk state  requiring multiple hospitalizations recently.  Plan: Abnormal CT scan of the chest performed at the [facility] clinic showing groundglass nodules and mediastinal adenopathy: This was probably a viral pneumonia We need to repeat a CT scan of your chest now to make sure these findings have improved.  COPD-asthma overlap syndrome: Continue Trelegy daily Continue Flovent at night Continue albuterol as needed for chest tightness wheezing or shortness of breath Practice good hand hygiene Stay physically active Consider working strength training into your exercise routine  Chest congestion with mucus production: Use guaifenesin (Mucinex) twice a day Use the flutter valve twice a day to help clear the mucus out.  We will see you back in 3 months or sooner if needed  > 50% of this 25 min visit spent face-to-face   Current Outpatient Medications:  .  amLODipine (NORVASC) 5 MG tablet, Take 1 tablet (5 mg total) by mouth  daily. -- Office visit needed for further refills, Disp: 90 tablet, Rfl: 0 .  aspirin 81 MG chewable tablet, Chew 1 tablet by mouth daily., Disp: , Rfl:  .  atorvastatin (LIPITOR) 10 MG tablet, TAKE 1 TABLET EVERY EVENING, Disp: 90 tablet, Rfl: 4 .  Eszopiclone 3 MG TABS, TAKE 1 TABLET BY MOUTH EVERY NIGHT IMMEDIATELY BEFORE BEDTIME, Disp: 30 tablet, Rfl: 0 .  Fluticasone-Umeclidin-Vilant (TRELEGY ELLIPTA) 100-62.5-25 MCG/INH AEPB, Inhale 1 puff into the lungs daily., Disp: 180 each, Rfl: 1 .  metFORMIN (GLUCOPHAGE) 500 MG tablet, TAKE 1 TABLET TWICE A DAY WITH MEALS (OFFICE VISIT NEEDED FOR FURTHER REFILLS), Disp: 180 tablet, Rfl: 0 .  nitroGLYCERIN (NITROSTAT) 0.4 MG SL tablet, Place 1 tablet (0.4 mg total) under the tongue every 5 (five) minutes as needed for chest pain (up to 3 doses)., Disp: 25 tablet, Rfl: 4 .  pantoprazole (PROTONIX) 40 MG tablet, TAKE 1 TABLET DAILY, Disp: 90 tablet, Rfl: 4 .  Suvorexant (BELSOMRA) 10 MG TABS, Take 10 mg by mouth at bedtime., Disp: 30 tablet, Rfl: 2 .  venlafaxine XR (EFFEXOR-XR) 75 MG 24 hr capsule, TAKE 1 CAPSULE DAILY WITH BREAKFAST, Disp: 90 capsule, Rfl: 4 .  vitamin B-12 (CYANOCOBALAMIN) 1000 MCG tablet, Take 1,000 mcg by mouth daily., Disp: , Rfl:  .  Vitamin D, Ergocalciferol, (DRISDOL) 50000 units CAPS capsule, , Disp: , Rfl:  .  warfarin (COUMADIN) 3 MG tablet, TAKE ONE AND ONE-HALF TO TWO TABLETS DAILY AS DIRECTED BY COUMADIN CLINIC, Disp: 180 tablet, Rfl: 1 .  FLOVENT HFA 44 MCG/ACT inhaler, , Disp: , Rfl:  .  PROAIR RESPICLICK 161 (90 Base) MCG/ACT AEPB, , Disp: , Rfl:  .  Respiratory Therapy Supplies (FLUTTER) DEVI, Use as directed, Disp: 1 each, Rfl: 0

## 2018-05-17 NOTE — Patient Instructions (Signed)
Abnormal CT scan of the chest performed at the [facility] clinic showing groundglass nodules and mediastinal adenopathy: This was probably a viral pneumonia We need to repeat a CT scan of your chest now to make sure these findings have improved.  COPD-asthma overlap syndrome: Continue Trelegy daily Continue Flovent at night Continue albuterol as needed for chest tightness wheezing or shortness of breath Practice good hand hygiene Stay physically active Consider working strength training into your exercise routine  Chest congestion with mucus production: Use guaifenesin (Mucinex) twice a day Use the flutter valve twice a day to help clear the mucus out.  We will see you back in 3 months or sooner if needed

## 2018-05-26 ENCOUNTER — Telehealth: Payer: Self-pay | Admitting: Pulmonary Disease

## 2018-05-26 NOTE — Telephone Encounter (Signed)
LMTCB

## 2018-05-26 NOTE — Telephone Encounter (Signed)
Please try to generate these as telephone messages instead of medication refills.  We should not be triaging through a medication refill note is very difficult to read.   How long as the patient had the symptoms?  When did the symptoms start?  Any wheezing?  Is he using his rescue medications?  This is the information that we need in order to better assess the patient.   Wyn Quaker, FNP

## 2018-05-26 NOTE — Telephone Encounter (Signed)
Pt's wife called in stating that they have not received the Azithromycin. She states that she has called the pharmacy and they do not have the RX. [facility] - [phone]   Please advise CB# PT [phone]

## 2018-05-26 NOTE — Telephone Encounter (Signed)
Instructions      Return in about 3 months (around 08/17/2018).  Abnormal CT scan of the chest performed at the [facility] clinic showing groundglass nodules and mediastinal adenopathy: This was probably a viral pneumonia We need to repeat a CT scan of your chest now to make sure these findings have improved.  COPD-asthma overlap syndrome: Continue Trelegy daily Continue Flovent at night Continue albuterol as needed for chest tightness wheezing or shortness of breath Practice good hand hygiene Stay physically active Consider working strength training into your exercise routine  Chest congestion with mucus production: Use guaifenesin (Mucinex) twice a day Use the flutter valve twice a day to help clear the mucus out.  We will see you back in 3 months or sooner if needed     _______________________________________  Hulen Skains and spoke with patients wife, she stated that her husband is requesting a prescription of the zpak. Patient has a cough with production of yellow mucus. Patient is fatigue. Patient has a low fever of 99.0. patient has not traveled recently nor has he been around anyone who has. Patient denies body aches, denies chills. Denies sneezing. Aaron Edelman please advise, thank you.

## 2018-05-26 NOTE — Telephone Encounter (Signed)
Called and spoke with pt's wife [name] [name] who stated symptoms just began 2 days ago of coughing with occ clear mucus and also states that he has had some rattling in his chest which [name] [name] stated that Dr. Lake Bells heard at last visit. [name] [name] states that pt had a slight temp of 99.0.   Pt has not had any increased SOB or chest tightness. Pt is using trelegy inhaler as prescribed. Pt also has had to use the Proair inhaler but is unsure how often he has been using it as pt was currently asleep and could not ask pt. [name] [name] does think that pt is only using the proair inhaler on an as needed basis and does not think he is using it that often.  [name] [name] states that they have not done any recent traveling and have not been around anyone that has been sick.

## 2018-05-26 NOTE — Telephone Encounter (Signed)
Sorry the patient is not feeling well.  Can offer:  Azithromycin 250mg  tablet  >>>Take 2 tablets (500mg  total) today, and then 1 tablet (250mg ) for the next four days  >>>take with food  >>>can also take probiotic and / or yogurt while on antibiotic   Prednisone 10mg  tablet  >>>4 tabs for 2 days, then 3 tabs for 2 days, 2 tabs for 2 days, then 1 tab for 2 days, then stop >>>take with food  >>>take in the morning   Can use Tylenol as needed for management of fever  Please place the order.   Please also review with Santiago Glad and the patient:  Coronavirus (RCVEL-38) Are you at risk?  Are you at risk for the Coronavirus (COVID-19)?  To be considered HIGH RISK for Coronavirus (COVID-19), you have to meet the following criteria:  . Traveled to [place], [place], [place], [place] or [place]; or in the [place] to [place], [place], [place], or [place]; and have fever, cough, and shortness of breath within the last 2 weeks of travel OR . Been in close contact with a person diagnosed with COVID-19 within the last 2 weeks and have fever, cough, and shortness of breath . IF YOU DO NOT MEET THESE CRITERIA, YOU ARE CONSIDERED LOW RISK FOR COVID-19.  What to do if you are HIGH RISK for COVID-19?  [name] If you are having a medical emergency, call 911. . Seek medical care right away. Before you go to a doctor's office, urgent care or emergency department, call ahead and tell them about your recent travel, contact with someone diagnosed with COVID-19, and your symptoms. You should receive instructions from your physician's office regarding next steps of care.  . When you arrive at healthcare provider, tell the healthcare staff immediately you have returned from visiting [place], [place], [place], [place] or [place]; or traveled in the [place] to [place], [place], [place], or [place]; in the last two weeks or you have been in close contact with a person diagnosed with COVID-19 in the  last 2 weeks.   . Tell the health care staff about your symptoms: fever, cough and shortness of breath. . After you have been seen by a medical provider, you will be either: o Tested for (COVID-19) and discharged home on quarantine except to seek medical care if symptoms worsen, and asked to  - Stay home and avoid contact with others until you get your results (4-5 days)  - Avoid travel on public transportation if possible (such as bus, train, or airplane) or o Sent to the Emergency Department by EMS for evaluation, COVID-19 testing, and possible admission depending on your condition and test results.  What to do if you are LOW RISK for COVID-19?  Reduce your risk of any infection by using the same precautions used for avoiding the common cold or flu:  [name] Wash your hands often with soap and warm water for at least 20 seconds.  If soap and water are not readily available, use an alcohol-based hand sanitizer with at least 60% alcohol.  . If coughing or sneezing, cover your mouth and nose by coughing or sneezing into the elbow areas of your shirt or coat, into a tissue or into your sleeve (not your hands). . Avoid shaking hands with others and consider head nods or verbal greetings only. . Avoid touching your eyes, nose, or mouth with unwashed hands.  . Avoid close contact with people who are sick. . Avoid  places or events with large numbers of people in one location, like concerts or sporting events. . Carefully consider travel plans you have or are making. . If you are planning any travel outside or inside the [place], visit the CDC's Travelers' Health webpage for the latest health notices. . If you have some symptoms but not all symptoms, continue to monitor at home and seek medical attention if your symptoms worsen. . If you are having a medical emergency, call 911.   [place] / e-Visit: [email or web address]          [facility]: [facility]: [phone]                   [facility]: [phone]            If patient symptoms worsen they will need to contact our office or utilize the [facility] health telehealth visit listed above.    Wyn Quaker, FNP

## 2018-05-27 MED ORDER — PREDNISONE 10 MG PO TABS: ORAL_TABLET | ORAL | 0 refills | Status: DC

## 2018-05-27 MED ORDER — AZITHROMYCIN 250 MG PO TABS: ORAL_TABLET | ORAL | 0 refills | Status: DC

## 2018-05-27 NOTE — Telephone Encounter (Signed)
Called and spoke with pt's wife Santiago Glad letting her know the info per Tyler Aas. Santiago Glad expressed understanding. I did provide her the phone number for [facility] in case pt did become worse. Verified pt's preferred pharmacy and have sent both zpak and pred taper in. Nothing further needed.

## 2018-05-31 ENCOUNTER — Telehealth: Payer: Self-pay | Admitting: *Deleted

## 2018-05-31 NOTE — Telephone Encounter (Signed)

## 2018-06-01 ENCOUNTER — Ambulatory Visit (INDEPENDENT_AMBULATORY_CARE_PROVIDER_SITE_OTHER)
Admission: RE | Admit: 2018-06-01 | Discharge: 2018-06-01 | Disposition: A | Discharge status: Home / Self Care | Payer: Medicare Other | Source: Ambulatory Visit | Attending: Pulmonary Disease | Admitting: Pulmonary Disease

## 2018-06-01 ENCOUNTER — Telehealth: Payer: Self-pay | Admitting: Pulmonary Disease

## 2018-06-01 ENCOUNTER — Ambulatory Visit (INDEPENDENT_AMBULATORY_CARE_PROVIDER_SITE_OTHER): Payer: Medicare Other | Admitting: Pharmacist

## 2018-06-01 ENCOUNTER — Other Ambulatory Visit: Payer: Self-pay

## 2018-06-01 ENCOUNTER — Other Ambulatory Visit: Payer: Self-pay | Admitting: Internal Medicine

## 2018-06-01 DIAGNOSIS — Z7901 Long term (current) use of anticoagulants: Secondary | ICD-10-CM | POA: Diagnosis not present

## 2018-06-01 DIAGNOSIS — R911 Solitary pulmonary nodule: Secondary | ICD-10-CM | POA: Diagnosis not present

## 2018-06-01 DIAGNOSIS — I480 Paroxysmal atrial fibrillation: Secondary | ICD-10-CM

## 2018-06-01 DIAGNOSIS — Z86718 Personal history of other venous thrombosis and embolism: Secondary | ICD-10-CM | POA: Diagnosis not present

## 2018-06-01 DIAGNOSIS — R918 Other nonspecific abnormal finding of lung field: Secondary | ICD-10-CM | POA: Diagnosis not present

## 2018-06-01 LAB — POCT INR: INR: 2.9 (ref 2.0–3.0)

## 2018-06-01 NOTE — Telephone Encounter (Signed)
Spoke with pt' wife, she was concerned about pt getting his CT scan done today due to the coronavirus crisis. I advised her that I spoke with [facility] and they are taking emergency cases only and she feels that he should come in to get the CT and coumadin today and not reschedule. I reassured her that they are taking careful measures with wiping and screening patients before they come in. Pt's wife understood and nothing further is needed.

## 2018-06-01 NOTE — Patient Instructions (Signed)
Description   Continue with 2 tablets daily.  Repeat INR in 6 weeks.

## 2018-06-03 ENCOUNTER — Inpatient Hospital Stay: Admission: RE | Admit: 2018-06-03 | Payer: Medicare Other | Source: Ambulatory Visit

## 2018-06-21 ENCOUNTER — Other Ambulatory Visit: Payer: Self-pay

## 2018-06-21 ENCOUNTER — Encounter: Payer: Self-pay | Admitting: Gastroenterology

## 2018-06-21 ENCOUNTER — Ambulatory Visit (INDEPENDENT_AMBULATORY_CARE_PROVIDER_SITE_OTHER): Payer: Medicare Other | Admitting: Gastroenterology

## 2018-06-21 VITALS — Wt 265.0 lb

## 2018-06-21 DIAGNOSIS — Z7901 Long term (current) use of anticoagulants: Secondary | ICD-10-CM

## 2018-06-21 DIAGNOSIS — Z86010 Personal history of colonic polyps: Secondary | ICD-10-CM

## 2018-06-21 NOTE — Progress Notes (Signed)
Virtual Visit via Video Note  I connected with [name] [name] on 06/21/18 at  1:30 PM EDT by a video enabled telemedicine application and verified that I am speaking with the correct person using two identifiers.   I discussed the limitations of evaluation and management by telemedicine and the availability of in person appointments. The patient expressed understanding and agreed to proceed.  THIS ENCOUNTER IS A VIRTUAL VISIT DUE TO COVID-19 - PATIENT WAS NOT SEEN IN THE OFFICE. PATIENT HAS CONSENTED TO VIRTUAL VISIT / TELEMEDICINE VISIT. USED DOXIMITY TO SEE HIM BUT COULD NOT HEAR HIM, USED PHONE FOR AUDIO   Location of patient: home Location of provider: office Persons participating: myself, patient   HPI :  73 y/o male here for a follow up visit. I know him from a prior colonoscopy 3 years ago, at which time he had 4 adenomas removed. He has a history of DVT and PE, on coumadin chronically.    He denies any problems with his bowels. He is not having any blood in the stools. No cardiopulmonary symptoms, no new changes in his health. No abdominal pains. Overall feels well. We discussed timing of his next colonoscopy in light of his anticoagulated status and COVID-19 outbreak and how he wanted to proceed. He denies any major changes to his health since I have last seen him.   Colonoscopy 06/25/2015 -  4 small adenomas, diverticulosis, hemorrhoids - fair prep required a lot of time to lavage the colon but adequate views obtained.    Past Medical History:  Diagnosis Date  . Arthritis   . Asthma   . BPH (benign prostatic hypertrophy)   . CAD (coronary artery disease)    a. 07/2013: s/p DES to LAD, normal LVF.  [name] Complication of anesthesia    "I got all kinds of hallucinations"  . COPD (chronic obstructive pulmonary disease) ([place])    a. 07/2013 PFT's mild airflow obstruction, no restriction, sev decrease in DLCO.  . Diverticulosis   . DVT (deep venous thrombosis) ([employer])    a. 2010  Lower ext s/p back surgery.  [name] Dyspnea on exertion    a. 07/2013 PFT's mild airflow obstr   . GERD (gastroesophageal reflux disease)   . History of gout   . History of hiatal hernia   . Hx of echocardiogram 2015   Echo (06/2013): EF 60-65% normal wall motion, normal diastolic function, aortic sclerosis without stenosis, Trivial MR, mild SAM due to long, redundant mitral leaflets, mild RAE, normal RVSF  . Hypertension   . Macrocytic anemia    a. 07/2013: documented on prior labs.  . Obesity   . OSA (obstructive sleep apnea) 11/14/2015  . PAF (paroxysmal atrial fibrillation) ([place])    a. Flecainide discontinued 07/2013 in setting of CAD.; b. s/p PVI Ablation at [facility] (Dr Joseph Berkshire) 11/2014  . Pneumonia ~ 2010 X 1  . Pulmonary embolism ([place])    a. 2010 in setting of DVT post-op back surgery. b. Low prob VQ 07/2013.  [name] Spinal stenosis    [place]  . Transaminitis    a. 07/2013: mild.  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 ANTERIOR LUMBAR [place] ARTHROPLASTY  03/2008   "spacer poped out; had to repair"  . CATARACT EXTRACTION W/ INTRAOCULAR LENS  IMPLANT, BILATERAL Bilateral   . COLONOSCOPY W/ POLYPECTOMY  2004  . Colonoscopy with polypectomy  09/2011   2 tubular adenomas  .  CORONARY ANGIOPLASTY WITH STENT PLACEMENT  08/08/2013   "1"  . JOINT REPLACEMENT    . LEFT AND RIGHT HEART CATHETERIZATION WITH CORONARY ANGIOGRAM N/A 08/07/2013   Procedure: LEFT AND RIGHT HEART CATHETERIZATION WITH CORONARY ANGIOGRAM;  Surgeon: Peter M Martinique, MD;  Location: [facility] CATH LAB;  Service: Cardiovascular;  Laterality: N/A;  . LUMBAR FUSION  02/2008  . PERCUTANEOUS CORONARY STENT INTERVENTION (PCI-S)  08/07/2013   Procedure: PERCUTANEOUS CORONARY STENT INTERVENTION (PCI-S);  Surgeon: Peter M Martinique, MD;  Location: [facility] CATH LAB;  Service: Cardiovascular;;  . PVI ablation  11/2014   Dr. Venita Sheffield [facility]  . TOTAL HIP ARTHROPLASTY Bilateral    . VASECTOMY     Family History  Problem Relation Age of Onset  . Heart attack Paternal Grandfather        Over 41  . Atrial fibrillation Mother   . COPD Mother   . Heart disease Father   . Colon cancer Father   . Heart attack Brother 47  . Alcohol abuse Brother 45       Died post liver transplant  . Heart attack Paternal Uncle        Less than 66  . Stroke Neg Hx    Social History   Tobacco Use  . Smoking status: Former Smoker    Packs/day: 2.00    Years: 40.00    Pack years: 80.00    Types: Cigarettes    Last attempt to quit: 03/09/2000    Years since quitting: 18.2  . Smokeless tobacco: Never Used  . Tobacco comment: smoked 1964-2002, up to 3 ppd  Substance Use Topics  . Alcohol use: Yes    Alcohol/week: 2.0 - 3.0 standard drinks    Types: 2 - 3 Cans of beer per week    Comment: drinks beer several times weekly  . Drug use: No   Current Outpatient Medications  Medication Sig Dispense Refill  . amLODipine (NORVASC) 5 MG tablet TAKE 1 TABLET DAILY (NEED OFFICE VISIT FOR FURTHER REFILLS) 90 tablet 0  . aspirin 81 MG chewable tablet Chew 1 tablet by mouth daily.    [name] atorvastatin (LIPITOR) 10 MG tablet TAKE 1 TABLET EVERY EVENING 90 tablet 4  . FLOVENT HFA 44 MCG/ACT inhaler     . Fluticasone-Umeclidin-Vilant (TRELEGY ELLIPTA) 100-62.5-25 MCG/INH AEPB Inhale 1 puff into the lungs daily. 180 each 1  . metFORMIN (GLUCOPHAGE) 500 MG tablet TAKE 1 TABLET TWICE A DAY WITH MEALS (OFFICE VISIT NEEDED FOR FURTHER REFILLS) 180 tablet 0  . nitroGLYCERIN (NITROSTAT) 0.4 MG SL tablet Place 1 tablet (0.4 mg total) under the tongue every 5 (five) minutes as needed for chest pain (up to 3 doses). 25 tablet 4  . pantoprazole (PROTONIX) 40 MG tablet TAKE 1 TABLET DAILY 90 tablet 4  . PROAIR RESPICLICK 779 (90 Base) MCG/ACT AEPB     . vitamin B-12 (CYANOCOBALAMIN) 1000 MCG tablet Take 1,000 mcg by mouth daily.    . Vitamin D, Ergocalciferol, (DRISDOL) 50000 units CAPS capsule     .  warfarin (COUMADIN) 3 MG tablet TAKE ONE AND ONE-HALF TO TWO TABLETS DAILY AS DIRECTED BY COUMADIN CLINIC 180 tablet 1  . predniSONE (DELTASONE) 10 MG tablet Take 4tabsx2days, 3tabsx2days, 2tabsx2days, 1tabx2days, then stop (Patient not taking: Reported on 06/21/2018) 20 tablet 0  . Respiratory Therapy Supplies (FLUTTER) DEVI Use as directed (Patient not taking: Reported on 06/21/2018) 1 each 0  . Suvorexant (BELSOMRA) 10 MG TABS Take 10 mg by mouth  at bedtime. (Patient not taking: Reported on 06/21/2018) 30 tablet 2  . venlafaxine XR (EFFEXOR-XR) 75 MG 24 hr capsule TAKE 1 CAPSULE DAILY WITH BREAKFAST (Patient not taking: Reported on 06/21/2018) 90 capsule 4   No current facility-administered medications for this visit.    Allergies  Allergen Reactions  . Penicillins Anaphylaxis     Because of a history of documented adverse serious drug reaction;Medi Alert bracelet  is recommended     Review of Systems: All systems reviewed and negative except where noted in HPI.    Ct Chest Wo Contrast  Result Date: 06/01/2018 CLINICAL DATA:  COPD with asthma and cough. EXAM: CT CHEST WITHOUT CONTRAST TECHNIQUE: Multidetector CT imaging of the chest was performed following the standard protocol without IV contrast. COMPARISON:  02/22/2017 FINDINGS: Cardiovascular: The heart size is normal. No substantial pericardial effusion. Coronary artery calcification is evident. Ascending thoracic aorta measures 4.2 cm diameter. Mediastinum/Nodes: No mediastinal lymphadenopathy. No evidence for gross hilar lymphadenopathy although assessment is limited by the lack of intravenous contrast on today's study. The esophagus has normal imaging features. There is no axillary lymphadenopathy. Lungs/Pleura: The central tracheobronchial airways are patent. Centrilobular and paraseptal emphysema evident. Mild bronchiectasis with bronchial wall thickening evident. Small airway impaction with atelectasis noted right middle lobe. Subtle  tree-in-bud nodularity noted peripheral left lower lobe (111/3). No focal airspace consolidation. No pleural effusion. Upper Abdomen: 12 mm low-density lesion posterior right liver is similar consistent with benign etiology and likely a cyst. Musculoskeletal: No worrisome lytic or sclerotic osseous abnormality. IMPRESSION: 1. Small focus of very subtle tree-in-bud nodularity peripheral left lower lobe suggests atypical infection. 2. No suspicious nodule or mass.  No lymphadenopathy. 3.  Aortic Atherosclerois (ICD10-170.0) 4. Ascending thoracic aorta measures 4.2 cm diameter today, similar to prior. Recommend annual imaging followup by CTA or MRA. This recommendation follows 2010 ACCF/AHA/AATS/ACR/ASA/SCA/SCAI/SIR/STS/SVM Guidelines for the Diagnosis and Management of Patients with Thoracic Aortic Disease. Circulation. 2010; 121: Z610-R604. Aortic aneurysm NOS (ICD10-I71.9) Electronically Signed   By: Misty Stanley M.D.   On: 06/01/2018 13:47   Lab Results  Component Value Date   WBC 9.9 08/24/2017   HGB 11.3 (L) 08/24/2017   HCT 33.3 (L) 08/24/2017   MCV 103.3 (H) 08/24/2017   PLT 422.0 (H) 08/24/2017    Lab Results  Component Value Date   CREATININE 0.95 08/24/2017   BUN 15 08/24/2017   NA 139 08/24/2017   K 4.1 08/24/2017   CL 105 08/24/2017   CO2 27 08/24/2017    Lab Results  Component Value Date   ALT 27 08/24/2017   AST 27 08/24/2017   ALKPHOS 73 08/24/2017   BILITOT 0.4 08/24/2017    Lab Results  Component Value Date   IRON 84 01/04/2017      Physical Exam: Wt 265 lb (120.2 kg)   BMI 34.02 kg/m   NA   ASSESSMENT AND PLAN:  73 y/o male here for reassessment of the following issues:  History of colon polyps / anticoagulated - 4 small adenomas removed during last colonoscopy in the setting of fair prep, recommending surveillance with colonoscopy. I discussed colonoscopy and anesthesia with him, risks / benefits of each, and he wanted to proceed. The question is the  timing of this. No surveillance exams currently being done in light of the COVID-19 outbreak. Recommend we do this once our office has reopened for elective procedures. I anticipate this in the next 2 months or so. He will be contacted for scheduling in the  next 6-8 weeks. Once we have a date, will reach out to his PCP to ask for approval to hold coumadin for 5 days prior to the exam. He agreed, all questions answered.  McCleary Cellar, MD [facility]

## 2018-06-24 NOTE — Progress Notes (Signed)
Called and left a detailed msg on machine with results ok per [facility]

## 2018-07-08 ENCOUNTER — Other Ambulatory Visit: Payer: Self-pay | Admitting: Pulmonary Disease

## 2018-07-09 ENCOUNTER — Other Ambulatory Visit: Payer: Self-pay | Admitting: Pulmonary Disease

## 2018-07-11 ENCOUNTER — Telehealth: Payer: Self-pay

## 2018-07-11 NOTE — Telephone Encounter (Signed)
lmom for prescreen  

## 2018-07-12 ENCOUNTER — Other Ambulatory Visit: Payer: Self-pay

## 2018-07-12 ENCOUNTER — Ambulatory Visit (INDEPENDENT_AMBULATORY_CARE_PROVIDER_SITE_OTHER): Payer: Medicare Other | Admitting: Pharmacist

## 2018-07-12 ENCOUNTER — Telehealth: Payer: Self-pay | Admitting: *Deleted

## 2018-07-12 DIAGNOSIS — Z86718 Personal history of other venous thrombosis and embolism: Secondary | ICD-10-CM

## 2018-07-12 DIAGNOSIS — I480 Paroxysmal atrial fibrillation: Secondary | ICD-10-CM

## 2018-07-12 DIAGNOSIS — Z7901 Long term (current) use of anticoagulants: Secondary | ICD-10-CM | POA: Diagnosis not present

## 2018-07-12 LAB — POCT INR: INR: 2.8 (ref 2.0–3.0)

## 2018-07-12 NOTE — Telephone Encounter (Signed)
1. Do you currently have a fever? NO (yes = cancel and refer to pcp for e-visit) 2. Have you recently travelled on a cruise, internationally, or to [place], [place], [place], [place], [place], or [place], [place] [employer]) ? No (yes = cancel, stay home, monitor symptoms, and contact pcp or initiate e-visit if symptoms develop) 3. Have you been in contact with someone that is currently pending confirmation of Covid19 testing or has been confirmed to have the [place] virus? No (yes = cancel, stay home, away from tested individual, monitor symptoms, and contact pcp or initiate e-visit if symptoms develop) 4. Are you currently experiencing fatigue or cough? No (yes = pt should be prepared to have a mask placed at the time of their visit).  Pt. Advised that we are restricting visitors at this time and anyone present in the vehicle should meet the above criteria as well. Advised that visit will be at curbside for finger stick ONLY and will receive call with instructions. Pt also advised to please bring own pen for signature of arrival document.

## 2018-07-13 ENCOUNTER — Other Ambulatory Visit: Payer: Self-pay | Admitting: Cardiology

## 2018-07-22 ENCOUNTER — Encounter: Payer: Self-pay | Admitting: Internal Medicine

## 2018-07-24 NOTE — Progress Notes (Signed)
Subjective:    Patient ID: [name] [name], male    DOB: [birth year], 73 y.o.   MRN: [id number]  HPI The patient is here for an acute visit.   Right elbow redness and tender: 1 week ago he noticed that his right elbow was swollen and mildly tender.  There is also some mild redness there.  The swelling has been consistent and has not increased or decreased as he first noticed it.  He states it may hurt a little less first thing in the morning.  He is able to bend his elbow without difficulty, but it does cause a little bit of discomfort.  He denies any injuries or applying any extended pressure on the elbow.  He denies any fevers, chills, numbness/tingling.  He otherwise feels fine.    His blood pressure is elevated here today.  Typically it is better controlled.  He thinks it is just from being here-in the doctor's office.  Thinks the coronavirus situation is not helping either.   His wife was concerned that he may be depressed.  He has been staying at home and not doing anything and sleeping a lot.  He feels he is fine.  He feels his medication is appropriate.  He states having to stay home with the coronavirus situation does not leave much else to do besides sleep.  He has no other concerns.   Medications and allergies reviewed with patient and updated if appropriate.  Patient Active Problem List   Diagnosis Date Noted  . Non-rheumatic mitral regurgitation 04/16/2017  . Bilateral leg edema 12/26/2016  . Anxiety 12/26/2016  . Scar tissue 11/02/2016  . Plantar fasciitis 10/06/2016  . 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 Vitamin D deficiency 07/21/2016  . OSA (obstructive sleep apnea) 11/14/2015  . Prediabetes 04/17/2015  . COPD GOLD II 08/21/2014  . Hyperlipidemia 08/20/2014  . Spinal stenosis of lumbar region 05/30/2014  . Abnormal CT of the abdomen 05/30/2014  . Dizziness 02/08/2014  . Macrocytic anemia 08/08/2013  . CAD (coronary artery disease)   . AAA (abdominal  aortic aneurysm) without rupture ([place]) 05/02/2013  . Obesity (BMI 30-39.9) 03/21/2013  . Family history of malignant neoplasm of gastrointestinal tract 08/25/2011  . 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 Fatigue 03/24/2011  . DOE (dyspnea on exertion) 03/12/2011  . Warfarin anticoagulation 03/11/2011  . INSOMNIA-SLEEP DISORDER-UNSPEC 09/12/2009  . CERVICAL RADICULOPATHY, RIGHT 07/24/2009  . DEGENERATIVE JOINT DISEASE, ADVANCED 05/21/2009  . AVASCULAR NECROSIS 05/21/2009  . ALLERGIC RHINITIS CAUSE UNSPECIFIED 11/01/2008  . PULMONARY NODULE, RIGHT LOWER LOBE 10/25/2008  . DIVERTICULOSIS, COLON 10/07/2008  . Hypertrophic obstructive cardiomyopathy ([place]) 10/05/2008  . PULMONARY EMBOLISM, HX OF 10/05/2008  . ASTHMA 02/20/2008  . HYPERPLASIA PROSTATE UNS W/UR OBST & OTH LUTS 06/28/2007  . PAF (paroxysmal atrial fibrillation) ([place]) 06/25/2007  . NOCTURIA 05/12/2007  . COLONIC POLYPS 04/01/2006  . GOUT 04/01/2006  . Essential hypertension 04/01/2006  . ACID REFLUX DISEASE 04/01/2006    Current Outpatient Medications on File Prior to Visit  Medication Sig Dispense Refill  . amLODipine (NORVASC) 5 MG tablet TAKE 1 TABLET DAILY (NEED OFFICE VISIT FOR FURTHER REFILLS) 90 tablet 0  . aspirin 81 MG chewable tablet Chew 1 tablet by mouth daily.    [name] atorvastatin (LIPITOR) 10 MG tablet TAKE 1 TABLET EVERY EVENING 90 tablet 4  . FLOVENT HFA 44 MCG/ACT inhaler     . Fluticasone-Umeclidin-Vilant (TRELEGY ELLIPTA) 100-62.5-25 MCG/INH AEPB Inhale 1 puff into the lungs daily. 180 each  1  . metFORMIN (GLUCOPHAGE) 500 MG tablet TAKE 1 TABLET TWICE A DAY WITH MEALS (OFFICE VISIT NEEDED FOR FURTHER REFILLS) 180 tablet 0  . nitroGLYCERIN (NITROSTAT) 0.4 MG SL tablet Place 1 tablet (0.4 mg total) under the tongue every 5 (five) minutes as needed for chest pain (up to 3 doses). 25 tablet 4  . pantoprazole (PROTONIX) 40 MG tablet TAKE 1 TABLET DAILY 90 tablet 4  . PROAIR RESPICLICK 096 (90 Base) MCG/ACT  AEPB     . Respiratory Therapy Supplies (FLUTTER) DEVI Use as directed 1 each 0  . Suvorexant (BELSOMRA) 10 MG TABS Take 10 mg by mouth at bedtime. 30 tablet 2  . venlafaxine XR (EFFEXOR-XR) 75 MG 24 hr capsule TAKE 1 CAPSULE DAILY WITH BREAKFAST 90 capsule 4  . vitamin B-12 (CYANOCOBALAMIN) 1000 MCG tablet Take 1,000 mcg by mouth daily.    . Vitamin D, Ergocalciferol, (DRISDOL) 50000 units CAPS capsule     . warfarin (COUMADIN) 3 MG tablet TAKE 2 TABLETS DAILY OR AS DIRECTED BY COUMADIN CLINIC 180 tablet 1   No current facility-administered medications on file prior to visit.     Past Medical History:  Diagnosis Date  . Arthritis   . Asthma   . BPH (benign prostatic hypertrophy)   . CAD (coronary artery disease)    a. 07/2013: s/p DES to LAD, normal LVF.  [name] Complication of anesthesia    "I got all kinds of hallucinations"  . COPD (chronic obstructive pulmonary disease) ([place])    a. 07/2013 PFT's mild airflow obstruction, no restriction, sev decrease in DLCO.  . Diverticulosis   . DVT (deep venous thrombosis) ([place])    a. 2010 Lower ext s/p back surgery.  [name] Dyspnea on exertion    a. 07/2013 PFT's mild airflow obstr   . GERD (gastroesophageal reflux disease)   . History of gout   . History of hiatal hernia   . Hx of echocardiogram 2015   Echo (06/2013): EF 60-65% normal wall motion, normal diastolic function, aortic sclerosis without stenosis, Trivial MR, mild SAM due to long, redundant mitral leaflets, mild RAE, normal RVSF  . Hypertension   . Macrocytic anemia    a. 07/2013: documented on prior labs.  . Obesity   . OSA (obstructive sleep apnea) 11/14/2015  . PAF (paroxysmal atrial fibrillation) ([place])    a. Flecainide discontinued 07/2013 in setting of CAD.; b. s/p PVI Ablation at [facility] (Dr Joseph Berkshire) 11/2014  . Pneumonia ~ 2010 X 1  . Pulmonary embolism ([place])    a. 2010 in setting of DVT post-op back surgery. b. Low prob VQ 07/2013.  [name] Spinal stenosis    Congential  .  Transaminitis    a. 07/2013: mild.  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 ANTERIOR LUMBAR [place] ARTHROPLASTY  03/2008   "spacer poped out; had to repair"  . CATARACT EXTRACTION W/ INTRAOCULAR LENS  IMPLANT, BILATERAL Bilateral   . COLONOSCOPY W/ POLYPECTOMY  2004  . Colonoscopy with polypectomy  09/2011   2 tubular adenomas  . CORONARY ANGIOPLASTY WITH STENT PLACEMENT  08/08/2013   "1"  . JOINT REPLACEMENT    . LEFT AND RIGHT HEART CATHETERIZATION WITH CORONARY ANGIOGRAM N/A 08/07/2013   Procedure: LEFT AND RIGHT HEART CATHETERIZATION WITH CORONARY ANGIOGRAM;  Surgeon: Peter M Martinique, MD;  Location: [facility] CATH LAB;  Service: Cardiovascular;  Laterality: N/A;  . LUMBAR FUSION  02/2008  . PERCUTANEOUS CORONARY STENT  INTERVENTION (PCI-S)  08/07/2013   Procedure: PERCUTANEOUS CORONARY STENT INTERVENTION (PCI-S);  Surgeon: Peter M Martinique, MD;  Location[facility] CATH LAB;  Service: Cardiovascular;;  . PVI ablation  11/2014   Dr. Venita Sheffield [facility]  . TOTAL HIP ARTHROPLASTY Bilateral   . VASECTOMY      Social History   Socioeconomic History  . Marital status: Married    Spouse name: Not on file  . Number of children: 4  . Years of education: Not on file  . Highest education level: Not on file  Occupational History  . Occupation: Retired from [employer]: RETIRED  Social Needs  . Financial resource strain: Not hard at all  . Food insecurity:    Worry: Never true    Inability: Never true  . Transportation needs:    Medical: No    Non-medical: No  Tobacco Use  . Smoking status: Former Smoker    Packs/day: 2.00    Years: 40.00    Pack years: 80.00    Types: Cigarettes    Last attempt to quit: 03/09/2000    Years since quitting: 18.3  . Smokeless tobacco: Never Used  . Tobacco comment: smoked 1964-2002, up to 3 ppd  Substance and Sexual Activity  . Alcohol use: Yes    Alcohol/week: 2.0 - 3.0 standard drinks    Types:  2 - 3 Cans of beer per week    Comment: drinks beer several times weekly  . Drug use: No  . Sexual activity: Never    Comment: "sexually hx is none of your business" (02/08/2014)  Lifestyle  . Physical activity:    Days per week: 0 days    Minutes per session: 0 min  . Stress: Only a little  Relationships  . Social connections:    Talks on phone: More than three times a week    Gets together: More than three times a week    Attends religious service: Not on file    Active member of club or organization: Not on file    Attends meetings of clubs or organizations: Not on file    Relationship status: Married  Other Topics Concern  . Not on file  Social History Narrative  . Not on file    Family History  Problem Relation Age of Onset  . Heart attack Paternal Grandfather        Over 68  . Atrial fibrillation Mother   . COPD Mother   . Heart disease Father   . Colon cancer Father   . Heart attack Brother 74  . Alcohol abuse Brother 18       Died post liver transplant  . Heart attack Paternal Uncle        Less than 83  . Stroke Neg Hx     Review of Systems  Constitutional: Negative for chills and fever.  Respiratory: Negative for cough, shortness of breath and wheezing.   Musculoskeletal: Positive for joint swelling (Right elbow).  Skin: Positive for color change. Negative for wound.  Neurological: Negative for light-headedness and headaches.       Objective:   Vitals:   07/25/18 1021  BP: (!) 158/72  Pulse: 83  Resp: 16  Temp: 98.8 F (37.1 C)  SpO2: 96%   BP Readings from Last 3 Encounters:  07/25/18 (!) 158/72  05/17/18 124/62  10/18/17 (!) 146/85   Wt Readings from Last 3 Encounters:  07/25/18 278 lb 1.9 oz (126.2 kg)  06/21/18 265 lb (120.2 kg)  05/17/18 275 lb 12.8 oz (125.1 kg)   Body mass index is 35.71 kg/m.   Physical Exam Constitutional:      General: He is not in acute distress.    Appearance: Normal appearance. He is not ill-appearing.   Musculoskeletal:     Comments: Right elbow with olecranon bursitis-overlying erythema and warmth, mild tenderness.  Mild tenderness with movement of the elbow  Skin:    General: Skin is warm and dry.     Findings: Erythema (Right posterior elbow-mild warmth, mild tenderness) present.  Neurological:     Mental Status: He is alert.            Assessment & Plan:    See Problem List for Assessment and Plan of chronic medical problems.

## 2018-07-25 ENCOUNTER — Ambulatory Visit (INDEPENDENT_AMBULATORY_CARE_PROVIDER_SITE_OTHER): Payer: Medicare Other | Admitting: Internal Medicine

## 2018-07-25 ENCOUNTER — Encounter: Payer: Self-pay | Admitting: Internal Medicine

## 2018-07-25 ENCOUNTER — Other Ambulatory Visit: Payer: Self-pay

## 2018-07-25 VITALS — BP 158/72 | HR 83 | Temp 98.8°F | Resp 16 | Ht 74.0 in | Wt 278.1 lb

## 2018-07-25 DIAGNOSIS — M7021 Olecranon bursitis, right elbow: Secondary | ICD-10-CM | POA: Diagnosis not present

## 2018-07-25 MED ORDER — DOXYCYCLINE HYCLATE 100 MG PO TABS: 100.0000 mg | ORAL_TABLET | Freq: Two times a day (BID) | ORAL | 0 refills | Status: DC

## 2018-07-25 NOTE — Patient Instructions (Addendum)
Start the doxycycline and finish the entire course.   Avoid putting pressure on the elbow.  You can wrap it, which may help reduce the swelling.  You can a apply ice pack.     Call if you have any questions.    Elbow Bursitis  Bursitis is swelling and pain at the tip of the elbow. This happens when fluid builds up in a sac under the skin (bursa). This may also be called olecranon bursitis. What are the causes? Elbow bursitis may be caused by:  Elbow injury, such as falling onto the elbow.  Leaning on hard surfaces for long periods of time.  Infection from an injury that breaks the skin near the elbow.  A bone growth (spur) that forms at the tip of the elbow.  A medical condition that causes inflammation, such as gout or rheumatoid arthritis. Sometimes the cause is not known. What are the signs or symptoms? The first sign of elbow bursitis is usually swelling at the tip of the elbow. This can grow to be about the size of a golf ball. Swelling may start suddenly or develop gradually. Other symptoms may include:  Pain when bending or leaning on the elbow.  Not being able to move the elbow normally. If bursitis is caused by an infection, you may have:  Redness, warmth, and tenderness of the elbow.  Drainage of pus from the swollen area over the elbow, if the skin breaks open. How is this diagnosed? This condition may be diagnosed based on:  Your symptoms and medical history.  Any recent injuries you have had.  A physical exam.  X-rays to check for a bone spur or fracture.  Draining fluid from the bursa to test it for infection.  Blood tests to rule out gout or rheumatoid arthritis. How is this treated? Treatment for elbow bursitis depends on the cause. Treatment may include:  Medicines. These may include: ? Over-the-counter medicines to relieve pain and inflammation. ? Antibiotic medicines. ? Injections of anti-inflammatory medicines (steroids).  Draining fluid  from the bursa.  Wrapping your elbow with a bandage.  Wearing elbow pads. If these treatments do not help, you may need surgery to remove the bursa. Follow these instructions at home: Medicines  Take over-the-counter and prescription medicines only as told by your health care provider.  If you were prescribed an antibiotic medicine, take it as told by your health care provider. Do not stop taking the antibiotic even if you start to feel better. Managing pain, stiffness, and swelling   If directed, put ice on your elbow: ? Put ice in a plastic bag. ? Place a towel between your skin and the bag. ? Leave the ice on for 20 minutes, 2-3 times a day.  If your bursitis is caused by an injury, rest your elbow and wear your bandage as told by your health care provider.  Use elbow pads or elbow wraps to cushion your elbow as needed. General instructions  Avoid any activities that cause elbow pain. Ask your health care provider what activities are safe for you.  Keep all follow-up visits as told by your health care provider. This is important. Contact a health care provider if you have:  A fever.  Symptoms that do not get better with treatment.  Pain or swelling that: ? Gets worse. ? Goes away and then comes back.  Pus draining from your elbow. Get help right away if you have:  Trouble moving your arm, hand, or fingers. Summary  Elbow bursitis is inflammation of the fluid-filled sac (bursa) between the tip of your elbow bone (olecranon) and your skin.  Treatment for elbow bursitis depends on the cause. It may include medicines to relieve pain and inflammation, antibiotic medicines, and draining fluid from your elbow.  Contact a health care provider if your symptoms do not get better with treatment, or if your symptoms go away and then come back. This information is not intended to replace advice given to you by your health care provider. Make sure you discuss any questions you  have with your health care provider. Document Released: 03/25/2006 Document Revised: 02/02/2017 Document Reviewed: 02/02/2017 Elsevier Interactive Patient Education  2019 [employer].

## 2018-07-25 NOTE — Assessment & Plan Note (Signed)
Symptoms and exam are consistent with olecranon bursitis of the right elbow Given the overlying erythema, slight increased warmth and tenderness I will go ahead and start him on antibiotics I do not suspect septic joint Doxycycline 100 mg twice daily x10 days He will monitor closely and let me know if there is no improvement or if he has any fever or increased joint pain Advised that he can ice, use compression  Advised him to call if he has any questions

## 2018-07-28 ENCOUNTER — Telehealth: Payer: Self-pay | Admitting: Internal Medicine

## 2018-07-28 NOTE — Telephone Encounter (Signed)
Pt wife wanted Dr. Quay Burow to know that the patient is running out of breath very easily and is profusely sweating with little activity. Has to stop and catch a breath when taking the trash out. Please advise

## 2018-07-28 NOTE — Telephone Encounter (Signed)
Left mess for patient to call back. CRM created.  

## 2018-07-28 NOTE — Telephone Encounter (Signed)
He has an upcoming appointment with cardiology and he should discuss with them.   If there is no cardiac cause he may just need to increase his activity.

## 2018-08-04 ENCOUNTER — Telehealth: Payer: Self-pay | Admitting: Cardiology

## 2018-08-04 NOTE — Telephone Encounter (Signed)
Wife called on behalf of the patient. She states that the patient has been extremely fatigued lately. She did not mention any other symptoms. She wanted to know if he could be seen by Dr. Percival Spanish or someone on his care team.

## 2018-08-04 NOTE — Telephone Encounter (Signed)
I would suggest that he comes into the office to see an APP next available as I won't be in for awhile.

## 2018-08-04 NOTE — Telephone Encounter (Signed)
Returned call to patient's wife. She reports patient is "so out of breath" but he gets like that but it is worse. She reports he says he is "so worn out". She states most of the days he just lies around and is more sleepy than ever. She reports he thought he was in AF about 1 week ago but when he took his vitals, he wasn't. She thinks he has been feeling like this for a few weeks.   Wife put patient on the phone who corroborated this information. Patient check VS while on the phone (BP 153/87, HR 80). He reports this is normal BP readings. Patient reports no med changes. He denies chest pain.   Explained to patient that I would send message to MD to review and either I or his MA would call with his recommendations.

## 2018-08-04 NOTE — Telephone Encounter (Signed)
Called patient scheduled him for NP appointment 6/2 @ 1115 with Curt Bears, NP      COVID-19 Pre-Screening Questions:  . In the past 7 to 10 days have you had a cough,  shortness of breath, headache, congestion, fever (100 or greater) body aches, chills, sore throat, or sudden loss of taste or sense of smell? . Have you been around anyone with known Covid 19. . Have you been around anyone who is awaiting Covid 19 test results in the past 7 to 10 days? . Have you been around anyone who has been exposed to Covid 19, or has mentioned symptoms of Covid 19 within the past 7 to 10 days?  If you have any concerns/questions about symptoms patients report during screening (either on the phone or at threshold). Contact the provider seeing the patient or DOD for further guidance.  If neither are available contact a member of the leadership team.  Patient answered NO to the above questions

## 2018-08-06 ENCOUNTER — Other Ambulatory Visit: Payer: Self-pay | Admitting: Internal Medicine

## 2018-08-08 NOTE — H&P (View-Only) (Signed)
Cardiology Office Note   Date:  08/09/2018   ID:  [name] [name], DOB 1945/12/31, MRN [id number]  PCP:  Binnie Rail, MD  Cardiologist:  Minus Breeding, MD  Chief Complaint  Patient presents with  . Shortness of Breath  . Fatigue     History of Present Illness: [name] [name] is a 73 y.o. male with a PMH of CAD s/p PCI/DES to LAD 07/2013, atrial fibrillation s/p ablation at the [facility] in 2016 but remains on coumadin due to ongoing paroxysmal atrial fibrillation, hypertension, AAA, DVT with PE (provoked following surgery ~57yr ago), along with OSA and COPD followed by Dr.Sood.   He was seen last by Dr. Percival Spanish in the office on 10/18/2017 and was doing well. He continues to follow intermittently at the [facility] for cardiology care and was last evaluated 03/2018 by Dr. Thomasene Lot at which time he was recovering from the flu and was noted to be in NSR at that visit.   He presents today for the evaluation of DOE and fatigue. His wife called the office 08/04/2018 with these complaints prompting this visit. He states for the past 2 months he has noticed DOE which has progressed to the point of occurring with minimal activity. He states these symptoms are similar to the angina he experienced prior to his cardiac stent placement in 2015. He also reports some tachycardia with HR in the 120s whenever he would go from sitting to standing which improved after several minutes. No palpitations, dizziness, or lightheadedness. He denies any chest pain/pressure/tightness. No complaints of LE edema, orthopnea, or PND. He states he is compliant with his CPAP machine.    Past Medical History:  Diagnosis Date  . Arthritis   . Asthma   . BPH (benign prostatic hypertrophy)   . CAD (coronary artery disease)    a. 07/2013: s/p DES to LAD, normal LVF.  [name] Complication of anesthesia    "I got all kinds of hallucinations"  . COPD (chronic obstructive pulmonary disease) ([place])    a. 07/2013 PFT's mild  airflow obstruction, no restriction, sev decrease in DLCO.  . Diverticulosis   . DVT (deep venous thrombosis) ([place])    a. 2010 Lower ext s/p back surgery.  [name] Dyspnea on exertion    a. 07/2013 PFT's mild airflow obstr   . GERD (gastroesophageal reflux disease)   . History of gout   . History of hiatal hernia   . Hx of echocardiogram 2015   Echo (06/2013): EF 60-65% normal wall motion, normal diastolic function, aortic sclerosis without stenosis, Trivial MR, mild SAM due to long, redundant mitral leaflets, mild RAE, normal RVSF  . Hypertension   . Macrocytic anemia    a. 07/2013: documented on prior labs.  . Obesity   . OSA (obstructive sleep apnea) 11/14/2015  . PAF (paroxysmal atrial fibrillation) ([place])    a. Flecainide discontinued 07/2013 in setting of CAD.; b. s/p PVI Ablation at [facility] (Dr Joseph Berkshire) 11/2014  . Pneumonia ~ 2010 X 1  . Pulmonary embolism ([place])    a. 2010 in setting of DVT post-op back surgery. b. Low prob VQ 07/2013.  [name] Spinal stenosis    Congential  . Transaminitis    a. 07/2013: mild.  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 ANTERIOR LUMBAR [place] ARTHROPLASTY  03/2008   "spacer poped out; had to repair"  . CATARACT EXTRACTION W/ INTRAOCULAR LENS  IMPLANT, BILATERAL Bilateral   .  COLONOSCOPY W/ POLYPECTOMY  2004  . Colonoscopy with polypectomy  09/2011   2 tubular adenomas  . CORONARY ANGIOPLASTY WITH STENT PLACEMENT  08/08/2013   "1"  . JOINT REPLACEMENT    . LEFT AND RIGHT HEART CATHETERIZATION WITH CORONARY ANGIOGRAM N/A 08/07/2013   Procedure: LEFT AND RIGHT HEART CATHETERIZATION WITH CORONARY ANGIOGRAM;  Surgeon: Peter M Martinique, MD;  Location: [facility] CATH LAB;  Service: Cardiovascular;  Laterality: N/A;  . LUMBAR FUSION  02/2008  . PERCUTANEOUS CORONARY STENT INTERVENTION (PCI-S)  08/07/2013   Procedure: PERCUTANEOUS CORONARY STENT INTERVENTION (PCI-S);  Surgeon: Peter M Martinique, MD;  Location: [facility] CATH LAB;   Service: Cardiovascular;;  . PVI ablation  11/2014   Dr. Venita Sheffield [facility]  . TOTAL HIP ARTHROPLASTY Bilateral   . VASECTOMY       Current Outpatient Medications  Medication Sig Dispense Refill  . amLODipine (NORVASC) 5 MG tablet TAKE 1 TABLET DAILY (NEED OFFICE VISIT FOR FURTHER REFILLS) 90 tablet 0  . aspirin 81 MG chewable tablet Chew 1 tablet by mouth daily.    [name] atorvastatin (LIPITOR) 10 MG tablet TAKE 1 TABLET EVERY EVENING 90 tablet 4  . metFORMIN (GLUCOPHAGE) 500 MG tablet Take 1 tablet twice a day with meals. Office visit needed for more refills. 180 tablet 0  . nitroGLYCERIN (NITROSTAT) 0.4 MG SL tablet Place 1 tablet (0.4 mg total) under the tongue every 5 (five) minutes as needed for chest pain (up to 3 doses). 25 tablet 4  . pantoprazole (PROTONIX) 40 MG tablet TAKE 1 TABLET DAILY 90 tablet 4  . Respiratory Therapy Supplies (FLUTTER) DEVI Use as directed 1 each 0  . venlafaxine XR (EFFEXOR-XR) 75 MG 24 hr capsule TAKE 1 CAPSULE DAILY WITH BREAKFAST 90 capsule 4  . vitamin B-12 (CYANOCOBALAMIN) 1000 MCG tablet Take 1,000 mcg by mouth daily.    . Vitamin D, Ergocalciferol, (DRISDOL) 50000 units CAPS capsule     . warfarin (COUMADIN) 3 MG tablet TAKE 2 TABLETS DAILY OR AS DIRECTED BY COUMADIN CLINIC 180 tablet 1   No current facility-administered medications for this visit.     Allergies:   Penicillins    Social History:  The patient  reports that he quit smoking about 18 years ago. His smoking use included cigarettes. He has a 80.00 pack-year smoking history. He has never used smokeless tobacco. He reports current alcohol use of about 2.0 - 3.0 standard drinks of alcohol per week. He reports that he does not use drugs.   Family History:  The patient's family history includes Alcohol abuse (age of onset: 1) in his brother; Atrial fibrillation in his mother; COPD in his mother; Colon cancer in his father; Heart attack in his paternal grandfather and paternal  uncle; Heart attack (age of onset: 50) in his brother; Heart disease in his father.    ROS: All other systems are reviewed and negative. Unless otherwise mentioned in H&P    PHYSICAL EXAM: VS:  BP (!) 164/81   Pulse 89   Temp (!) 97.3 F (36.3 C)   Ht 6\' 2"  (1.88 m)   Wt 279 lb 3.2 oz (126.6 kg)   SpO2 95%   BMI 35.85 kg/m  , BMI Body mass index is 35.85 kg/m. GEN: Well nourished, well developed, in no acute distress HEENT: sclera anicteric Neck: no JVD, carotid bruits, or masses Cardiac: RRR; no murmurs, rubs, or gallops, no edema  Respiratory:  Scattered expiratory wheeze, otherwise clear to auscultation bilaterally, normal work  of breathing GI: soft, obese, nontender, nondistended, + BS MS: no deformity or atrophy Skin: warm and dry, no rash Neuro:  Strength and sensation are intact Psych: euthymic mood, full affect   EKG:  EKG is ordered today. The ekg ordered today demonstrates sinus rhythm, rate 89, QTC 420, No STE/D, no TWI; no significant change from previous.    Recent Labs: 08/24/2017: ALT 27; BUN 15; Creatinine, Ser 0.95; Hemoglobin 11.3; Platelets 422.0; Potassium 4.1; Sodium 139    Lipid Panel    Component Value Date/Time   CHOL 113 08/24/2017 1459   TRIG 93.0 08/24/2017 1459   HDL 46.10 08/24/2017 1459   CHOLHDL 2 08/24/2017 1459   VLDL 18.6 08/24/2017 1459   LDLCALC 48 08/24/2017 1459      Wt Readings from Last 3 Encounters:  08/09/18 279 lb 3.2 oz (126.6 kg)  07/25/18 278 lb 1.9 oz (126.2 kg)  06/21/18 265 lb (120.2 kg)      Other studies Reviewed: Additional studies/ records that were reviewed today include:  Left heart catheterization 2015: Coronary angiography: Coronary dominance: right  Left mainstem: Normal  Left anterior descending (LAD): There is an eccentric complex 70% stenosis in the proximal LAD at the takeoff of the first septal perforator. There are mild irregularities in the distal vessel.  Left circumflex (LCx): The  LCx gives rise to 2 OM branches. No significant disease.  Right coronary artery (RCA): The RCA has diffuse irregularities less than 20%.  Echocardiogram 03/2018 (care everywhere): CONCLUSIONS: - Technically difficult exam due to body habitus and suboptimal positioning. - Exam indication: Shortness of Breath - The left ventricle is normal in size. Left ventricular systolic function is  normal. EF = 66  5% (2D biplane) - The right ventricle is normal in size. Right ventricular systolic function is  normal.  ASSESSMENT AND PLAN:  1. Unstable angina CAD s/p PCI/DES to LAD in 2015: He presents with DOE reminiscent of angina he experienced prior to his stent in 2015. Symptoms have progressively worsened over the past 2 months. Last ischemic evaluation was a NST in 2018 which was negative. Echo 03/2018 with EF 65%. EKG today without STE/D or TWI.  - Will plan to repeat a left heart catheterization. The patient understands that risks included but are not limited to stroke (1 in 1000), death (1 in 51), kidney failure [usually temporary] (1 in 500), bleeding (1 in 200), allergic reaction [possibly serious] (1 in 200).  The patient understands and agrees to proceed. Plan discussed with Dr. Martinique who is in agreement.  - Will repeat echocardiogram given change in symptoms since his last 03/2018.  - Patient instructed to hold his coumadin starting tomorrow (already took dose today) in preparation for [facility] 08/15/2018.  - Continue aspirin and statin - SL nitro instructions reviewed. His prescription is up to date.   2. Paroxysmal atrial fibrillation: patient has a history of atrial fibrillation s/p ablation. INR followed by the coumadin clinic - next appt 08/23/2018. He has been feeling increased fatigue. EKG today with sinus rhythm.  - CHA2DS2-VASc Score of 3 (HTN, Vascular, and Age 41-74). He is on coumadin for stroke ppx. INR 3.5 today. No contraindication for apixaban, though patient would like to have Dr.  Rosezella [place] blessing prior to changing anticoagulation. Could consider transition to apixaban 5mg  BID following catheterization.  - Coumadin clinic evaluated patient today and recommended holding coumadin until cardiac catheterization scheduled for 08/15/2018  3. HTN: Initial BP elevated, repeated during exam and improved to 132/82.  -  Continue amlodipine  4. HLD: LDL 48 08/2017 on low dose atorvastatin - If catheterization with progression of disease, may need to consider increasing atorvastatin  5. AAA: last [place] 06/2017 with largest measurement 3.8cm which was mildly increased from 3.1cm in 2017.  - Will address at follow-up visit post cath - due for annual [place]   6. OSA: Followed by Dr. Halford Chessman - Continue CPAP  7. DM type 2: on metformin - Patient instructed to hold metformin on the morning of his catheterization.    Current medicines are reviewed at length with the patient today.    Labs/ tests ordered today include: BMET and CBC  Will have him follow-up in the office in 2 weeks following his cardiac catheterization.    Abigail Butts, PA-C     08/09/2018 12:49 PM    [address] Office [phone] Fax [phone]

## 2018-08-08 NOTE — Progress Notes (Signed)
Cardiology Office Note   Date:  08/09/2018   ID:  [name] [name], DOB 04/11/1945, MRN [id number]  PCP:  Binnie Rail, MD  Cardiologist:  Minus Breeding, MD  Chief Complaint  Patient presents with  . Shortness of Breath  . Fatigue     History of Present Illness: [name] [name] is a 73 y.o. male with a PMH of CAD s/p PCI/DES to LAD 07/2013, atrial fibrillation s/p ablation at the [facility] in 2016 but remains on coumadin due to ongoing paroxysmal atrial fibrillation, hypertension, AAA, DVT with PE (provoked following surgery ~37yr ago), along with OSA and COPD followed by Dr.Sood.   He was seen last by Dr. Percival Spanish in the office on 10/18/2017 and was doing well. He continues to follow intermittently at the [facility] for cardiology care and was last evaluated 03/2018 by Dr. Thomasene Lot at which time he was recovering from the flu and was noted to be in NSR at that visit.   He presents today for the evaluation of DOE and fatigue. His wife called the office 08/04/2018 with these complaints prompting this visit. He states for the past 2 months he has noticed DOE which has progressed to the point of occurring with minimal activity. He states these symptoms are similar to the angina he experienced prior to his cardiac stent placement in 2015. He also reports some tachycardia with HR in the 120s whenever he would go from sitting to standing which improved after several minutes. No palpitations, dizziness, or lightheadedness. He denies any chest pain/pressure/tightness. No complaints of LE edema, orthopnea, or PND. He states he is compliant with his CPAP machine.    Past Medical History:  Diagnosis Date  . Arthritis   . Asthma   . BPH (benign prostatic hypertrophy)   . CAD (coronary artery disease)    a. 07/2013: s/p DES to LAD, normal LVF.  [name] Complication of anesthesia    "I got all kinds of hallucinations"  . COPD (chronic obstructive pulmonary disease) ([place])    a. 07/2013 PFT's mild  airflow obstruction, no restriction, sev decrease in DLCO.  . Diverticulosis   . DVT (deep venous thrombosis) ([place])    a. 2010 Lower ext s/p back surgery.  [name] Dyspnea on exertion    a. 07/2013 PFT's mild airflow obstr   . GERD (gastroesophageal reflux disease)   . History of gout   . History of hiatal hernia   . Hx of echocardiogram 2015   Echo (06/2013): EF 60-65% normal wall motion, normal diastolic function, aortic sclerosis without stenosis, Trivial MR, mild SAM due to long, redundant mitral leaflets, mild RAE, normal RVSF  . Hypertension   . Macrocytic anemia    a. 07/2013: documented on prior labs.  . Obesity   . OSA (obstructive sleep apnea) 11/14/2015  . PAF (paroxysmal atrial fibrillation) ([place])    a. Flecainide discontinued 07/2013 in setting of CAD.; b. s/p PVI Ablation at [facility] (Dr Joseph Berkshire) 11/2014  . Pneumonia ~ 2010 X 1  . Pulmonary embolism ([place])    a. 2010 in setting of DVT post-op back surgery. b. Low prob VQ 07/2013.  [name] Spinal stenosis    Congential  . Transaminitis    a. 07/2013: mild.  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 ANTERIOR LUMBAR [place] ARTHROPLASTY  03/2008   "spacer poped out; had to repair"  . CATARACT EXTRACTION W/ INTRAOCULAR LENS  IMPLANT, BILATERAL Bilateral   .  COLONOSCOPY W/ POLYPECTOMY  2004  . Colonoscopy with polypectomy  09/2011   2 tubular adenomas  . CORONARY ANGIOPLASTY WITH STENT PLACEMENT  08/08/2013   "1"  . JOINT REPLACEMENT    . LEFT AND RIGHT HEART CATHETERIZATION WITH CORONARY ANGIOGRAM N/A 08/07/2013   Procedure: LEFT AND RIGHT HEART CATHETERIZATION WITH CORONARY ANGIOGRAM;  Surgeon: Peter M Martinique, MD;  Location: [facility] CATH LAB;  Service: Cardiovascular;  Laterality: N/A;  . LUMBAR FUSION  02/2008  . PERCUTANEOUS CORONARY STENT INTERVENTION (PCI-S)  08/07/2013   Procedure: PERCUTANEOUS CORONARY STENT INTERVENTION (PCI-S);  Surgeon: Peter M Martinique, MD;  Location: [facility] CATH LAB;   Service: Cardiovascular;;  . PVI ablation  11/2014   Dr. Venita Sheffield [facility]  . TOTAL HIP ARTHROPLASTY Bilateral   . VASECTOMY       Current Outpatient Medications  Medication Sig Dispense Refill  . amLODipine (NORVASC) 5 MG tablet TAKE 1 TABLET DAILY (NEED OFFICE VISIT FOR FURTHER REFILLS) 90 tablet 0  . aspirin 81 MG chewable tablet Chew 1 tablet by mouth daily.    [name] atorvastatin (LIPITOR) 10 MG tablet TAKE 1 TABLET EVERY EVENING 90 tablet 4  . metFORMIN (GLUCOPHAGE) 500 MG tablet Take 1 tablet twice a day with meals. Office visit needed for more refills. 180 tablet 0  . nitroGLYCERIN (NITROSTAT) 0.4 MG SL tablet Place 1 tablet (0.4 mg total) under the tongue every 5 (five) minutes as needed for chest pain (up to 3 doses). 25 tablet 4  . pantoprazole (PROTONIX) 40 MG tablet TAKE 1 TABLET DAILY 90 tablet 4  . Respiratory Therapy Supplies (FLUTTER) DEVI Use as directed 1 each 0  . venlafaxine XR (EFFEXOR-XR) 75 MG 24 hr capsule TAKE 1 CAPSULE DAILY WITH BREAKFAST 90 capsule 4  . vitamin B-12 (CYANOCOBALAMIN) 1000 MCG tablet Take 1,000 mcg by mouth daily.    . Vitamin D, Ergocalciferol, (DRISDOL) 50000 units CAPS capsule     . warfarin (COUMADIN) 3 MG tablet TAKE 2 TABLETS DAILY OR AS DIRECTED BY COUMADIN CLINIC 180 tablet 1   No current facility-administered medications for this visit.     Allergies:   Penicillins    Social History:  The patient  reports that he quit smoking about 18 years ago. His smoking use included cigarettes. He has a 80.00 pack-year smoking history. He has never used smokeless tobacco. He reports current alcohol use of about 2.0 - 3.0 standard drinks of alcohol per week. He reports that he does not use drugs.   Family History:  The patient's family history includes Alcohol abuse (age of onset: 47) in his brother; Atrial fibrillation in his mother; COPD in his mother; Colon cancer in his father; Heart attack in his paternal grandfather and paternal  uncle; Heart attack (age of onset: 39) in his brother; Heart disease in his father.    ROS: All other systems are reviewed and negative. Unless otherwise mentioned in H&P    PHYSICAL EXAM: VS:  BP (!) 164/81   Pulse 89   Temp (!) 97.3 F (36.3 C)   Ht 6\' 2"  (1.88 m)   Wt 279 lb 3.2 oz (126.6 kg)   SpO2 95%   BMI 35.85 kg/m  , BMI Body mass index is 35.85 kg/m. GEN: Well nourished, well developed, in no acute distress HEENT: sclera anicteric Neck: no JVD, carotid bruits, or masses Cardiac: RRR; no murmurs, rubs, or gallops, no edema  Respiratory:  Scattered expiratory wheeze, otherwise clear to auscultation bilaterally, normal work  of breathing GI: soft, obese, nontender, nondistended, + BS MS: no deformity or atrophy Skin: warm and dry, no rash Neuro:  Strength and sensation are intact Psych: euthymic mood, full affect   EKG:  EKG is ordered today. The ekg ordered today demonstrates sinus rhythm, rate 89, QTC 420, No STE/D, no TWI; no significant change from previous.    Recent Labs: 08/24/2017: ALT 27; BUN 15; Creatinine, Ser 0.95; Hemoglobin 11.3; Platelets 422.0; Potassium 4.1; Sodium 139    Lipid Panel    Component Value Date/Time   CHOL 113 08/24/2017 1459   TRIG 93.0 08/24/2017 1459   HDL 46.10 08/24/2017 1459   CHOLHDL 2 08/24/2017 1459   VLDL 18.6 08/24/2017 1459   LDLCALC 48 08/24/2017 1459      Wt Readings from Last 3 Encounters:  08/09/18 279 lb 3.2 oz (126.6 kg)  07/25/18 278 lb 1.9 oz (126.2 kg)  06/21/18 265 lb (120.2 kg)      Other studies Reviewed: Additional studies/ records that were reviewed today include:  Left heart catheterization 2015: Coronary angiography: Coronary dominance: right  Left mainstem: Normal  Left anterior descending (LAD): There is an eccentric complex 70% stenosis in the proximal LAD at the takeoff of the first septal perforator. There are mild irregularities in the distal vessel.  Left circumflex (LCx): The  LCx gives rise to 2 OM branches. No significant disease.  Right coronary artery (RCA): The RCA has diffuse irregularities less than 20%.  Echocardiogram 03/2018 (care everywhere): CONCLUSIONS: - Technically difficult exam due to body habitus and suboptimal positioning. - Exam indication: Shortness of Breath - The left ventricle is normal in size. Left ventricular systolic function is  normal. EF = 66  5% (2D biplane) - The right ventricle is normal in size. Right ventricular systolic function is  normal.  ASSESSMENT AND PLAN:  1. Unstable angina CAD s/p PCI/DES to LAD in 2015: He presents with DOE reminiscent of angina he experienced prior to his stent in 2015. Symptoms have progressively worsened over the past 2 months. Last ischemic evaluation was a NST in 2018 which was negative. Echo 03/2018 with EF 65%. EKG today without STE/D or TWI.  - Will plan to repeat a left heart catheterization. The patient understands that risks included but are not limited to stroke (1 in 1000), death (1 in 38), kidney failure [usually temporary] (1 in 500), bleeding (1 in 200), allergic reaction [possibly serious] (1 in 200).  The patient understands and agrees to proceed. Plan discussed with Dr. Martinique who is in agreement.  - Will repeat echocardiogram given change in symptoms since his last 03/2018.  - Patient instructed to hold his coumadin starting tomorrow (already took dose today) in preparation for [facility] 08/15/2018.  - Continue aspirin and statin - SL nitro instructions reviewed. His prescription is up to date.   2. Paroxysmal atrial fibrillation: patient has a history of atrial fibrillation s/p ablation. INR followed by the coumadin clinic - next appt 08/23/2018. He has been feeling increased fatigue. EKG today with sinus rhythm.  - CHA2DS2-VASc Score of 3 (HTN, Vascular, and Age 37-74). He is on coumadin for stroke ppx. INR 3.5 today. No contraindication for apixaban, though patient would like to have Dr.  Rosezella [place] blessing prior to changing anticoagulation. Could consider transition to apixaban 5mg  BID following catheterization.  - Coumadin clinic evaluated patient today and recommended holding coumadin until cardiac catheterization scheduled for 08/15/2018  3. HTN: Initial BP elevated, repeated during exam and improved to 132/82.  -  Continue amlodipine  4. HLD: LDL 48 08/2017 on low dose atorvastatin - If catheterization with progression of disease, may need to consider increasing atorvastatin  5. AAA: last [place] 06/2017 with largest measurement 3.8cm which was mildly increased from 3.1cm in 2017.  - Will address at follow-up visit post cath - due for annual [place]   6. OSA: Followed by Dr. Halford Chessman - Continue CPAP  7. DM type 2: on metformin - Patient instructed to hold metformin on the morning of his catheterization.    Current medicines are reviewed at length with the patient today.    Labs/ tests ordered today include: BMET and CBC  Will have him follow-up in the office in 2 weeks following his cardiac catheterization.    Abigail Butts, PA-C     08/09/2018 12:49 PM    [address] Office [phone] Fax [phone]

## 2018-08-09 ENCOUNTER — Ambulatory Visit (INDEPENDENT_AMBULATORY_CARE_PROVIDER_SITE_OTHER): Payer: Medicare Other | Admitting: Medical

## 2018-08-09 ENCOUNTER — Ambulatory Visit (INDEPENDENT_AMBULATORY_CARE_PROVIDER_SITE_OTHER): Payer: Medicare Other | Admitting: Pharmacist

## 2018-08-09 ENCOUNTER — Other Ambulatory Visit: Payer: Self-pay

## 2018-08-09 ENCOUNTER — Other Ambulatory Visit: Payer: Self-pay | Admitting: Medical

## 2018-08-09 ENCOUNTER — Encounter: Payer: Self-pay | Admitting: Medical

## 2018-08-09 VITALS — BP 164/81 | HR 89 | Temp 97.3°F | Ht 74.0 in | Wt 279.2 lb

## 2018-08-09 DIAGNOSIS — I2510 Atherosclerotic heart disease of native coronary artery without angina pectoris: Secondary | ICD-10-CM | POA: Diagnosis not present

## 2018-08-09 DIAGNOSIS — Z7901 Long term (current) use of anticoagulants: Secondary | ICD-10-CM

## 2018-08-09 DIAGNOSIS — I480 Paroxysmal atrial fibrillation: Secondary | ICD-10-CM

## 2018-08-09 DIAGNOSIS — Z86718 Personal history of other venous thrombosis and embolism: Secondary | ICD-10-CM | POA: Diagnosis not present

## 2018-08-09 DIAGNOSIS — Z79899 Other long term (current) drug therapy: Secondary | ICD-10-CM

## 2018-08-09 DIAGNOSIS — I10 Essential (primary) hypertension: Secondary | ICD-10-CM

## 2018-08-09 DIAGNOSIS — I714 Abdominal aortic aneurysm, without rupture, unspecified: Secondary | ICD-10-CM

## 2018-08-09 DIAGNOSIS — I200 Unstable angina: Secondary | ICD-10-CM

## 2018-08-09 DIAGNOSIS — R0609 Other forms of dyspnea: Secondary | ICD-10-CM | POA: Diagnosis not present

## 2018-08-09 DIAGNOSIS — E7849 Other hyperlipidemia: Secondary | ICD-10-CM

## 2018-08-09 LAB — POCT INR: INR: 3.5 — AB (ref 2.0–3.0)

## 2018-08-09 NOTE — Patient Instructions (Addendum)
Medication Instructions:  HOLD COUMADIN TOMORROW THRU THE MORNING OF THE CATH If you need a refill on your cardiac medications before your next appointment, please call your pharmacy.  COVID-19 TESTING ON 08-10-2018 @ 335PM [place]: Echocardiogram - Your physician has requested that you have an echocardiogram. Echocardiography is a painless test that uses sound waves to create images of your heart. It provides your doctor with information about the size and shape of your heart and how well your heart's chambers and valves are working. This procedure takes approximately one hour. There are no restrictions for this procedure. This will be performed at our [facility] location - [address], [address].   Labwork: BMET AND CBC TODAY HERE IN OUR OFFICE AT [facility]   You will NOT need to fast   Take the provided lab slips with you to the lab for your blood draw.   When you have your labs (blood work) drawn today and your tests are completely normal, you will receive your results only by MyChart Message (if you have MyChart) -OR-  A paper copy in the mail.  If you have any lab test that is abnormal or we need to change your treatment, we will call you to review these results.  Follow-Up: You will need a follow up appointment on 08-31-18 @115AM  WITH KRISTA KROEGER, PA-C.  You may see Minus Breeding, MD or one of the following Advanced Practice Providers on your designated Care Team:  Rosaria Ferries, PA-C Jory Sims, DNP, ANP   At [facility], you and your health needs are our priority.  As part of our continuing mission to provide you with exceptional heart care, we have created designated Provider Care Teams.  These Care Teams include your primary Cardiologist (physician) and Advanced Practice Providers (APPs -  Physician Assistants and Nurse Practitioners) who all work together to provide you with the care you need, when you need it.  Thank you for  choosing [facility] at [facility]!!        [address] [place] [address] Dept: [phone] Loc: [place]  08/09/2018  You are scheduled for a Cardiac Catheterization on Monday, June 8 with Dr. Harrell Gave End.  1. Please arrive at the [facility] (Main Entrance A) at [facility]: [place], [place] [address] at 5:30 AM (This time is two hours before your procedure to ensure your preparation). Free valet parking service is available.   Special note: Every effort is made to have your procedure done on time. Please understand that emergencies sometimes delay scheduled procedures.  2. Diet: Do not eat solid foods after midnight.  The patient may have clear liquids until 5am upon the day of the procedure.  3. Labs: You will need to have blood drawn on Tuesday, June 2 at [place]  Open: [place] (Lunch 12:30 - 1:30)   Phone: [phone]. You do not need to be fasting.  4. Medication instructions in preparation for your procedure:   Contrast Allergy: No  Stop taking Coumadin (Warfarin) on Wednesday, June 3.  Do not take Diabetes Med Glucophage (Metformin) on the day of the procedure and HOLD 48 HOURS AFTER THE PROCEDURE.  On the morning of your procedure, take your Aspirin and any morning medicines NOT listed above.  You may use sips of  water.  5. Plan for one night stay--bring personal belongings. 6. Bring a current list of your medications and current insurance cards. 7. You MUST have a responsible person to drive you home. 8. Someone MUST be with you the first 24 hours after you arrive home or your discharge will be delayed. 9. Please wear clothes that are easy to get on and off and wear slip-on shoes.  Thank you for allowing [place] to care for you!   -- [facility]

## 2018-08-10 ENCOUNTER — Other Ambulatory Visit (HOSPITAL_COMMUNITY)
Admission: RE | Admit: 2018-08-10 | Discharge: 2018-08-10 | Disposition: A | Discharge status: Home / Self Care | Payer: Medicare Other | Source: Ambulatory Visit | Attending: Internal Medicine | Admitting: Internal Medicine

## 2018-08-10 DIAGNOSIS — Z1159 Encounter for screening for other viral diseases: Secondary | ICD-10-CM | POA: Diagnosis not present

## 2018-08-10 LAB — CBC
Hematocrit: 34.7 % — ABNORMAL LOW (ref 37.5–51.0)
Hemoglobin: 11.8 g/dL — ABNORMAL LOW (ref 13.0–17.7)
MCH: 35.2 pg — ABNORMAL HIGH (ref 26.6–33.0)
MCHC: 34 g/dL (ref 31.5–35.7)
MCV: 104 fL — ABNORMAL HIGH (ref 79–97)
NRBC: 1 % — ABNORMAL HIGH (ref 0–0)
Platelets: 460 10*3/uL — ABNORMAL HIGH (ref 150–450)
RBC: 3.35 x10E6/uL — ABNORMAL LOW (ref 4.14–5.80)
RDW: 15.1 % (ref 11.6–15.4)
WBC: 12.7 10*3/uL — ABNORMAL HIGH (ref 3.4–10.8)

## 2018-08-10 LAB — BASIC METABOLIC PANEL
BUN/Creatinine Ratio: 12 (ref 10–24)
BUN: 13 mg/dL (ref 8–27)
CO2: 21 mmol/L (ref 20–29)
Calcium: 9.8 mg/dL (ref 8.6–10.2)
Chloride: 103 mmol/L (ref 96–106)
Creatinine, Ser: 1.07 mg/dL (ref 0.76–1.27)
GFR calc Af Amer: 79 mL/min/{1.73_m2} (ref >59)
GFR calc non Af Amer: 68 mL/min/{1.73_m2} (ref >59)
Glucose: 91 mg/dL (ref 65–99)
Potassium: 5.3 mmol/L — ABNORMAL HIGH (ref 3.5–5.2)
Sodium: 140 mmol/L (ref 134–144)

## 2018-08-11 ENCOUNTER — Telehealth: Payer: Self-pay | Admitting: *Deleted

## 2018-08-11 LAB — NOVEL CORONAVIRUS, NAA (HOSP ORDER, SEND-OUT TO REF LAB; TAT 18-24 HRS): SARS-CoV-2, NAA: NOT DETECTED

## 2018-08-11 NOTE — Telephone Encounter (Addendum)
Pt contacted pre-catheterization scheduled at [facility] for: Monday June 8,2020 7:30 AM Verified arrival time and place: [place] Entrance A at: 5:30 AM  Covid-19 test date:08/11/18 [facility] in process  No solid food after midnight prior to cath, clear liquids until 5 AM day of procedure. Contrast allergy: no  Hold: Coumadin-hold 08/10/18 until post procedure. Metformin-day of procedure and 48 hours post procedure  Except hold medications AM meds can be  taken pre-cath with sip of water including: ASA 81 mg   Confirmed patient has responsible person to drive home post procedure and observe 24 hours after arriving home: yes  Due to Covid-19 pandemic no visitors are allowed in the hospital (unless cognitive impairment).  Their designated party will be called when their procedure is over for an update and to arrange pick up.  Patients are required to wear a mask when they enter the hospital.       COVID-19 Pre-Screening Questions:  . In the past 7 to 10 days have you had a cough,  shortness of breath, headache, congestion, fever (100 or greater) body aches, chills, sore throat, or sudden loss of taste or sense of smell? no . Have you been around anyone with known Covid 19? no . Have you been around anyone who is awaiting Covid 19 test results in the past 7 to 10 days? . Have you been around anyone who has been exposed to Covid 19, or has mentioned symptoms of Covid 19 within the past 7 to 10 days? no   I reviewed procedure/mask/visitor/Covid 19 screening questions with patient, he verbalized understanding, thanked me for call.

## 2018-08-12 ENCOUNTER — Telehealth: Payer: Self-pay | Admitting: Cardiology

## 2018-08-12 NOTE — Telephone Encounter (Signed)
New Message     Pt c/o Shortness Of Breath: STAT if SOB developed within the last 24 hours or pt is noticeably SOB on the phone  1. Are you currently SOB (can you hear that pt is SOB on the phone)? Pts wife is calling, says he has SOB when walking to the mailbox   2. How long have you been experiencing SOB? Awhile, she says he has a heart cath scheduled on Monday   3. Are you SOB when sitting or when up moving around? Moving around   4. Are you currently experiencing any other symptoms? Today pts HR was 140

## 2018-08-12 NOTE — Telephone Encounter (Signed)
Returned call to patient's wife no answer.LMTC. 

## 2018-08-15 ENCOUNTER — Other Ambulatory Visit: Payer: Self-pay

## 2018-08-15 ENCOUNTER — Encounter (HOSPITAL_COMMUNITY)
Admission: RE | Disposition: A | Discharge status: Home / Self Care | Payer: Self-pay | Source: Home / Self Care | Attending: Internal Medicine

## 2018-08-15 ENCOUNTER — Telehealth: Payer: Self-pay | Admitting: Cardiology

## 2018-08-15 ENCOUNTER — Ambulatory Visit (HOSPITAL_COMMUNITY)
Admission: RE | Admit: 2018-08-15 | Discharge: 2018-08-15 | Disposition: A | Discharge status: Home / Self Care | Payer: Medicare Other | Attending: Internal Medicine | Admitting: Internal Medicine

## 2018-08-15 ENCOUNTER — Encounter (HOSPITAL_COMMUNITY): Payer: Self-pay | Admitting: Internal Medicine

## 2018-08-15 DIAGNOSIS — E669 Obesity, unspecified: Secondary | ICD-10-CM | POA: Insufficient documentation

## 2018-08-15 DIAGNOSIS — N400 Enlarged prostate without lower urinary tract symptoms: Secondary | ICD-10-CM | POA: Insufficient documentation

## 2018-08-15 DIAGNOSIS — Z955 Presence of coronary angioplasty implant and graft: Secondary | ICD-10-CM | POA: Diagnosis not present

## 2018-08-15 DIAGNOSIS — J449 Chronic obstructive pulmonary disease, unspecified: Secondary | ICD-10-CM | POA: Insufficient documentation

## 2018-08-15 DIAGNOSIS — Z96643 Presence of artificial hip joint, bilateral: Secondary | ICD-10-CM | POA: Insufficient documentation

## 2018-08-15 DIAGNOSIS — M109 Gout, unspecified: Secondary | ICD-10-CM | POA: Insufficient documentation

## 2018-08-15 DIAGNOSIS — R0602 Shortness of breath: Secondary | ICD-10-CM | POA: Insufficient documentation

## 2018-08-15 DIAGNOSIS — E119 Type 2 diabetes mellitus without complications: Secondary | ICD-10-CM | POA: Insufficient documentation

## 2018-08-15 DIAGNOSIS — E785 Hyperlipidemia, unspecified: Secondary | ICD-10-CM | POA: Diagnosis not present

## 2018-08-15 DIAGNOSIS — Z79899 Other long term (current) drug therapy: Secondary | ICD-10-CM | POA: Insufficient documentation

## 2018-08-15 DIAGNOSIS — K219 Gastro-esophageal reflux disease without esophagitis: Secondary | ICD-10-CM | POA: Insufficient documentation

## 2018-08-15 DIAGNOSIS — I25110 Atherosclerotic heart disease of native coronary artery with unstable angina pectoris: Secondary | ICD-10-CM | POA: Diagnosis not present

## 2018-08-15 DIAGNOSIS — Z880 Allergy status to penicillin: Secondary | ICD-10-CM | POA: Diagnosis not present

## 2018-08-15 DIAGNOSIS — Z86718 Personal history of other venous thrombosis and embolism: Secondary | ICD-10-CM | POA: Diagnosis not present

## 2018-08-15 DIAGNOSIS — Z7901 Long term (current) use of anticoagulants: Secondary | ICD-10-CM | POA: Diagnosis not present

## 2018-08-15 DIAGNOSIS — M1990 Unspecified osteoarthritis, unspecified site: Secondary | ICD-10-CM | POA: Diagnosis not present

## 2018-08-15 DIAGNOSIS — Z8249 Family history of ischemic heart disease and other diseases of the circulatory system: Secondary | ICD-10-CM | POA: Insufficient documentation

## 2018-08-15 DIAGNOSIS — Z6835 Body mass index (BMI) 35.0-35.9, adult: Secondary | ICD-10-CM | POA: Insufficient documentation

## 2018-08-15 DIAGNOSIS — I714 Abdominal aortic aneurysm, without rupture: Secondary | ICD-10-CM | POA: Insufficient documentation

## 2018-08-15 DIAGNOSIS — I200 Unstable angina: Secondary | ICD-10-CM | POA: Diagnosis present

## 2018-08-15 DIAGNOSIS — I480 Paroxysmal atrial fibrillation: Secondary | ICD-10-CM | POA: Diagnosis not present

## 2018-08-15 DIAGNOSIS — Z7982 Long term (current) use of aspirin: Secondary | ICD-10-CM | POA: Diagnosis not present

## 2018-08-15 DIAGNOSIS — Z7984 Long term (current) use of oral hypoglycemic drugs: Secondary | ICD-10-CM | POA: Diagnosis not present

## 2018-08-15 DIAGNOSIS — I10 Essential (primary) hypertension: Secondary | ICD-10-CM | POA: Insufficient documentation

## 2018-08-15 DIAGNOSIS — G4733 Obstructive sleep apnea (adult) (pediatric): Secondary | ICD-10-CM | POA: Insufficient documentation

## 2018-08-15 DIAGNOSIS — Z86711 Personal history of pulmonary embolism: Secondary | ICD-10-CM | POA: Insufficient documentation

## 2018-08-15 DIAGNOSIS — Z87891 Personal history of nicotine dependence: Secondary | ICD-10-CM | POA: Diagnosis not present

## 2018-08-15 DIAGNOSIS — Z825 Family history of asthma and other chronic lower respiratory diseases: Secondary | ICD-10-CM | POA: Insufficient documentation

## 2018-08-15 DIAGNOSIS — Z9852 Vasectomy status: Secondary | ICD-10-CM | POA: Insufficient documentation

## 2018-08-15 HISTORY — PX: RIGHT/LEFT HEART CATH AND CORONARY ANGIOGRAPHY: CATH118266

## 2018-08-15 LAB — POCT I-STAT EG7
Acid-base deficit: 2 mmol/L (ref 0.0–2.0)
Bicarbonate: 23.4 mmol/L (ref 20.0–28.0)
Calcium, Ion: 1.17 mmol/L (ref 1.15–1.40)
HCT: 30 % — ABNORMAL LOW (ref 39.0–52.0)
Hemoglobin: 10.2 g/dL — ABNORMAL LOW (ref 13.0–17.0)
O2 Saturation: 73 %
Potassium: 4.2 mmol/L (ref 3.5–5.1)
Sodium: 143 mmol/L (ref 135–145)
TCO2: 25 mmol/L (ref 22–32)
pCO2, Ven: 43.6 mmHg — ABNORMAL LOW (ref 44.0–60.0)
pH, Ven: 7.339 (ref 7.250–7.430)
pO2, Ven: 41 mmHg (ref 32.0–45.0)

## 2018-08-15 LAB — POCT I-STAT 7, (LYTES, BLD GAS, ICA,H+H)
Acid-base deficit: 2 mmol/L (ref 0.0–2.0)
Bicarbonate: 23.2 mmol/L (ref 20.0–28.0)
Calcium, Ion: 1.17 mmol/L (ref 1.15–1.40)
HCT: 31 % — ABNORMAL LOW (ref 39.0–52.0)
Hemoglobin: 10.5 g/dL — ABNORMAL LOW (ref 13.0–17.0)
O2 Saturation: 99 %
Potassium: 4.3 mmol/L (ref 3.5–5.1)
Sodium: 142 mmol/L (ref 135–145)
TCO2: 24 mmol/L (ref 22–32)
pCO2 arterial: 39.8 mmHg (ref 32.0–48.0)
pH, Arterial: 7.374 (ref 7.350–7.450)
pO2, Arterial: 139 mmHg — ABNORMAL HIGH (ref 83.0–108.0)

## 2018-08-15 LAB — PROTIME-INR
INR: 1.1 (ref 0.8–1.2)
Prothrombin Time: 14.2 seconds (ref 11.4–15.2)

## 2018-08-15 SURGERY — RIGHT/LEFT HEART CATH AND CORONARY ANGIOGRAPHY
Anesthesia: LOCAL

## 2018-08-15 MED ORDER — MIDAZOLAM HCL 2 MG/2ML IJ SOLN
INTRAMUSCULAR | Status: AC
  Filled 2018-08-15: qty 2

## 2018-08-15 MED ORDER — SODIUM CHLORIDE 0.9 % WEIGHT BASED INFUSION
3.0000 mL/kg/h | INTRAVENOUS | Status: AC
  Administered 2018-08-15: 06:00:00 3 mL/kg/h via INTRAVENOUS

## 2018-08-15 MED ORDER — SODIUM CHLORIDE 0.9 % IV SOLN: 250.0000 mL | INTRAVENOUS | Status: DC | PRN

## 2018-08-15 MED ORDER — ACETAMINOPHEN 325 MG PO TABS: 650.0000 mg | ORAL_TABLET | ORAL | Status: DC | PRN

## 2018-08-15 MED ORDER — HEPARIN SODIUM (PORCINE) 1000 UNIT/ML IJ SOLN
INTRAMUSCULAR | Status: DC | PRN
  Administered 2018-08-15: 5000 [IU] via INTRAVENOUS

## 2018-08-15 MED ORDER — HEPARIN (PORCINE) IN NACL 1000-0.9 UT/500ML-% IV SOLN
INTRAVENOUS | Status: DC | PRN
  Administered 2018-08-15: 500 mL

## 2018-08-15 MED ORDER — IOHEXOL 350 MG/ML SOLN
INTRAVENOUS | Status: DC | PRN
  Administered 2018-08-15: 65 mL via INTRA_ARTERIAL

## 2018-08-15 MED ORDER — LIDOCAINE HCL (PF) 1 % IJ SOLN
INTRAMUSCULAR | Status: AC
  Filled 2018-08-15: qty 30

## 2018-08-15 MED ORDER — SODIUM CHLORIDE 0.9 % WEIGHT BASED INFUSION: 1.0000 mL/kg/h | INTRAVENOUS | Status: DC

## 2018-08-15 MED ORDER — ONDANSETRON HCL 4 MG/2ML IJ SOLN: 4.0000 mg | Freq: Four times a day (QID) | INTRAMUSCULAR | Status: DC | PRN

## 2018-08-15 MED ORDER — SODIUM CHLORIDE 0.9% FLUSH: 3.0000 mL | INTRAVENOUS | Status: DC | PRN

## 2018-08-15 MED ORDER — SODIUM CHLORIDE 0.9 % IV SOLN: INTRAVENOUS | Status: DC

## 2018-08-15 MED ORDER — HEPARIN (PORCINE) IN NACL 1000-0.9 UT/500ML-% IV SOLN
INTRAVENOUS | Status: AC
  Filled 2018-08-15: qty 1000

## 2018-08-15 MED ORDER — MIDAZOLAM HCL 2 MG/2ML IJ SOLN
INTRAMUSCULAR | Status: DC | PRN
  Administered 2018-08-15: 1 mg via INTRAVENOUS

## 2018-08-15 MED ORDER — FENTANYL CITRATE (PF) 100 MCG/2ML IJ SOLN
INTRAMUSCULAR | Status: DC | PRN
  Administered 2018-08-15: 50 ug via INTRAVENOUS
  Administered 2018-08-15: 25 ug via INTRAVENOUS

## 2018-08-15 MED ORDER — LIDOCAINE HCL (PF) 1 % IJ SOLN
INTRAMUSCULAR | Status: DC | PRN
  Administered 2018-08-15 (×2): 2 mL

## 2018-08-15 MED ORDER — VERAPAMIL HCL 2.5 MG/ML IV SOLN
INTRAVENOUS | Status: DC | PRN
  Administered 2018-08-15: 08:00:00 via INTRA_ARTERIAL

## 2018-08-15 MED ORDER — LABETALOL HCL 5 MG/ML IV SOLN: 10.0000 mg | INTRAVENOUS | Status: DC | PRN

## 2018-08-15 MED ORDER — SODIUM CHLORIDE 0.9% FLUSH: 3.0000 mL | Freq: Two times a day (BID) | INTRAVENOUS | Status: DC

## 2018-08-15 MED ORDER — HYDRALAZINE HCL 20 MG/ML IJ SOLN: 10.0000 mg | INTRAMUSCULAR | Status: DC | PRN

## 2018-08-15 MED ORDER — ASPIRIN 81 MG PO CHEW: 81.0000 mg | CHEWABLE_TABLET | ORAL | Status: DC

## 2018-08-15 MED ORDER — VERAPAMIL HCL 2.5 MG/ML IV SOLN
INTRAVENOUS | Status: AC
  Filled 2018-08-15: qty 2

## 2018-08-15 MED ORDER — METFORMIN HCL 500 MG PO TABS: 500.0000 mg | ORAL_TABLET | Freq: Two times a day (BID) | ORAL | Status: DC

## 2018-08-15 MED ORDER — FENTANYL CITRATE (PF) 100 MCG/2ML IJ SOLN
INTRAMUSCULAR | Status: AC
  Filled 2018-08-15: qty 2

## 2018-08-15 SURGICAL SUPPLY — 13 items
CATH BALLN WEDGE 5F 110CM (CATHETERS) ×2 IMPLANT
CATH OPTITORQUE TIG 4.0 5F (CATHETERS) ×2 IMPLANT
DEVICE RAD COMP TR BAND LRG (VASCULAR PRODUCTS) ×2 IMPLANT
GLIDESHEATH SLEND A-KIT 6F 22G (SHEATH) ×2 IMPLANT
GUIDEWIRE INQWIRE 1.5J.035X260 (WIRE) ×1 IMPLANT
INQWIRE 1.5J .035X260CM (WIRE) ×2
KIT HEART LEFT (KITS) ×2 IMPLANT
PACK CARDIAC CATHETERIZATION (CUSTOM PROCEDURE TRAY) ×2 IMPLANT
SHEATH GLIDE SLENDER 4/5FR (SHEATH) ×2 IMPLANT
TRANSDUCER W/STOPCOCK (MISCELLANEOUS) ×4 IMPLANT
TUBING ART PRESS 72  MALE/FEM (TUBING) ×1
TUBING ART PRESS 72 MALE/FEM (TUBING) ×1 IMPLANT
TUBING CIL FLEX 10 FLL-RA (TUBING) ×2 IMPLANT

## 2018-08-15 NOTE — Discharge Instructions (Signed)
DRINK PLENTY OF FLUIDS FOR THE NEXT 2-3 DAYS TO KEEP HYDRATED.  Radial Site Care  This sheet gives you information about how to care for yourself after your procedure. Your health care provider may also give you more specific instructions. If you have problems or questions, contact your health care provider. What can I expect after the procedure? After the procedure, it is common to have:  Bruising and tenderness at the catheter insertion area. Follow these instructions at home: Medicines  Take over-the-counter and prescription medicines only as told by your health care provider. Insertion site care  Follow instructions from your health care provider about how to take care of your insertion site. Make sure you: ? Wash your hands with soap and water before you change your bandage (dressing). If soap and water are not available, use hand sanitizer. ? Change your dressing as told by your health care provider. ? Leave stitches (sutures), skin glue, or adhesive strips in place. These skin closures may need to stay in place for 2 weeks or longer. If adhesive strip edges start to loosen and curl up, you may trim the loose edges. Do not remove adhesive strips completely unless your health care provider tells you to do that.  Check your insertion site every day for signs of infection. Check for: ? Redness, swelling, or pain. ? Fluid or blood. ? Pus or a bad smell. ? Warmth.  Do not take baths, swim, or use a hot tub until your health care provider approves.  You may shower 24-48 hours after the procedure, or as directed by your health care provider. ? Remove the dressing and gently wash the site with plain soap and water. ? Pat the area dry with a clean towel. ? Do not rub the site. That could cause bleeding.  Do not apply powder or lotion to the site. Activity   For 24 hours after the procedure, or as directed by your health care provider: ? Do not flex or bend the affected  arm. ? Do not push or pull heavy objects with the affected arm. ? Do not drive yourself home from the hospital or clinic. You may drive 24 hours after the procedure unless your health care provider tells you not to. ? Do not operate machinery or power tools.  Do not lift anything that is heavier than 10 lb (4.5 kg), or the limit that you are told, until your health care provider says that it is safe.  Ask your health care provider when it is okay to: ? Return to work or school. ? Resume usual physical activities or sports. ? Resume sexual activity. General instructions  If the catheter site starts to bleed, raise your arm and put firm pressure on the site. If the bleeding does not stop, get help right away. This is a medical emergency.  If you went home on the same day as your procedure, a responsible adult should be with you for the first 24 hours after you arrive home.  Keep all follow-up visits as told by your health care provider. This is important. Contact a health care provider if:  You have a fever.  You have redness, swelling, or yellow drainage around your insertion site. Get help right away if:  You have unusual pain at the radial site.  The catheter insertion area swells very fast.  The insertion area is bleeding, and the bleeding does not stop when you hold steady pressure on the area.  Your arm or  hand becomes pale, cool, tingly, or numb. These symptoms may represent a serious problem that is an emergency. Do not wait to see if the symptoms will go away. Get medical help right away. Call your local emergency services (911 in the [place] Do not drive yourself to the hospital. Summary  After the procedure, it is common to have bruising and tenderness at the site.  Follow instructions from your health care provider about how to take care of your radial site wound. Check the wound every day for signs of infection.  Do not lift anything that is heavier than 10 lb (4.5  kg), or the limit that you are told, until your health care provider says that it is safe. This information is not intended to replace advice given to you by your health care provider. Make sure you discuss any questions you have with your health care provider. Document Released: 03/28/2010 Document Revised: 03/31/2017 Document Reviewed: 03/31/2017 Elsevier Interactive Patient Education  2019 [employer].

## 2018-08-15 NOTE — Brief Op Note (Signed)
BRIEF CARDIAC CATHETERIZATION NOTE  DATE: 08/15/2018  TIME: 8:26 AM  PATIENT:  [name] [name]  73 y.o. male  PRE-OPERATIVE DIAGNOSIS:  Accelerating angina  POST-OPERATIVE DIAGNOSIS:  Same  PROCEDURE:  Procedure(s): RIGHT/LEFT HEART CATH AND CORONARY ANGIOGRAPHY (N/A)  SURGEON:  Surgeon(s) and Role:    * Carlester Kasparek, MD - Primary  FINDINGS: 1. Mild to moderate coronary artery disease. 2. Patent stent in the proximal LAD. 3. Normal left and right heart falling pressures. 4. No significant LVOT gradient at rest or with provocation (post-PVC).  RECOMMENDATIONS: 1. Continue medical therapy and secondary prevention. 2. Consider evaluation for other potential causes of dyspnea on exertion and tachycardia.  Nelva Bush, MD [facility] Pager: [phone]

## 2018-08-15 NOTE — Telephone Encounter (Signed)
Called patient, LVM advising that a message will be sent to Dr.Hochrein and his assistant to see if any available spots this week for follow cath visit.   Will route.

## 2018-08-15 NOTE — Progress Notes (Signed)
[facility] BAND REMOVAL  LOCATION:    right radial  DEFLATED PER PROTOCOL:    Yes.    TIME BAND OFF / DRESSING APPLIED:    1015   SITE UPON ARRIVAL:    Level 0  SITE AFTER BAND REMOVAL:    Level 0  CIRCULATION SENSATION AND MOVEMENT:    Within Normal Limits   Yes.

## 2018-08-15 NOTE — Interval H&P Note (Signed)
History and Physical Interval Note:  08/15/2018 7:09 AM  [name] [name]  has presented today for cardiac catheterization, with the diagnosis of unstable angina.  The various methods of treatment have been discussed with the patient and family. After consideration of risks, benefits and other options for treatment, the patient has consented to  Procedure(s): LEFT HEART CATH AND CORONARY ANGIOGRAPHY (N/A) as a surgical intervention.  The patient's history has been reviewed, patient examined, no change in status, stable for surgery.  I have reviewed the patient's chart and labs.  Questions were answered to the patient's satisfaction.    Cath Lab Visit (complete for each Cath Lab visit)  Clinical Evaluation Leading to the Procedure:   ACS: No.  Non-ACS:    Anginal Classification: CCS III  Anti-ischemic medical therapy: Minimal Therapy (1 class of medications)  Non-Invasive Test Results: No non-invasive testing performed  Prior CABG: No previous CABG   Math Brazie

## 2018-08-15 NOTE — Telephone Encounter (Signed)
New Message:    Pt had a Cardioversion today. Wife said the doctor said he needs to follow up with Dr Percival Spanish this week. Does this pt needs an Office visit or Virtual Visit?

## 2018-08-15 NOTE — Telephone Encounter (Signed)
New Message    Patient was just discharged from the hospital today and needs to come in for a doctors visit.  Patient seen Roby Lofts 08/09/18 in office.  Patient wants to know if he can get another in office visit with Dr. Percival Spanish this week.

## 2018-08-16 ENCOUNTER — Telehealth: Payer: Self-pay | Admitting: *Deleted

## 2018-08-16 ENCOUNTER — Telehealth (INDEPENDENT_AMBULATORY_CARE_PROVIDER_SITE_OTHER): Payer: Medicare Other | Admitting: Cardiology

## 2018-08-16 ENCOUNTER — Encounter: Payer: Self-pay | Admitting: Cardiology

## 2018-08-16 VITALS — BP 160/90 | HR 82 | Ht 74.0 in | Wt 270.0 lb

## 2018-08-16 DIAGNOSIS — R002 Palpitations: Secondary | ICD-10-CM | POA: Diagnosis not present

## 2018-08-16 DIAGNOSIS — R0609 Other forms of dyspnea: Secondary | ICD-10-CM

## 2018-08-16 DIAGNOSIS — I2510 Atherosclerotic heart disease of native coronary artery without angina pectoris: Secondary | ICD-10-CM | POA: Diagnosis not present

## 2018-08-16 NOTE — Telephone Encounter (Signed)
Pt have virtual visit with Dr Percival Spanish today

## 2018-08-16 NOTE — Progress Notes (Signed)
Virtual Visit via Telephone Note   This visit type was conducted due to national recommendations for restrictions regarding the COVID-19 Pandemic (e.g. social distancing) in an effort to limit this patient's exposure and mitigate transmission in our community.  Due to his co-morbid illnesses, this patient is at least at moderate risk for complications without adequate follow up.  This format is felt to be most appropriate for this patient at this time.  The patient did not have access to video technology/had technical difficulties with video requiring transitioning to audio format only (telephone).  All issues noted in this document were discussed and addressed.  No physical exam could be performed with this format.  Please refer to the patient's chart for his  consent to telehealth for [facility].   Date:  08/16/2018   ID:  [name] [name], DOB 31-Jan-1946, MRN [id number]  Patient Location: Home Provider Location: Home  PCP:  Binnie Rail, MD  Cardiologist:  Minus Breeding, MD  Electrophysiologist:  None   Evaluation Performed:  Follow-Up Visit  Chief Complaint:  SOB  History of Present Illness:    [name] [name] is a 73 y.o. male with SOB.   He has a PMH of CAD s/p PCI/DES to LAD 07/2013, atrial fibrillation s/p ablation at [facility] in 2016 but remains on coumadin due to ongoing paroxysmal atrial fibrillation, hypertension, AAA, DVT with PE (provoked following surgery ~40yr ago), along with OSA and COPD followed by Dr.Sood.  He had recent DOE and increased HR.  He had a cardiac cath yesterday with results as below.  I reviewed these records for this visit.      He says that his complaint has been that he has been having some episodes of shortness of breath he walks 150 feet to his mailbox he has to rest.  If he gets back home his heart rate will oftentimes be very elevated.  He takes pulse ox and I think his oxygen level sounds okay but his heart rate might be in the 140s.   He does not think it feels like his fibrillation.  Is not irregular.  He is not describing presyncope or syncope.  It takes him some time to recover.  He is not describing new chest pressure, neck or arm discomfort.  He does describe some change in his heart rate with change in position from a lying to a standing position.  He described to our PA that he was having symptoms similar to his angina but is found to have nonobstructive disease as described.  He is not describing new PND or orthopnea.  He has no new weight gain or edema.  The patient does not have symptoms concerning for COVID-19 infection (fever, chills, cough, or new shortness of breath).    Past Medical History:  Diagnosis Date  . Arthritis   . Asthma   . BPH (benign prostatic hypertrophy)   . CAD (coronary artery disease)    a. 07/2013: s/p DES to LAD, normal LVF.  [name] Complication of anesthesia    "I got all kinds of hallucinations"  . COPD (chronic obstructive pulmonary disease) ([address])    a. 07/2013 PFT's mild airflow obstruction, no restriction, sev decrease in DLCO.  . Diverticulosis   . DVT (deep venous thrombosis) ([place])    a. 2010 Lower ext s/p back surgery.  [name] Dyspnea on exertion    a. 07/2013 PFT's mild airflow obstr   . GERD (gastroesophageal reflux disease)   . History  of gout   . History of hiatal hernia   . Hx of echocardiogram 2015   Echo (06/2013): EF 60-65% normal wall motion, normal diastolic function, aortic sclerosis without stenosis, Trivial MR, mild SAM due to long, redundant mitral leaflets, mild RAE, normal RVSF  . Hypertension   . Macrocytic anemia    a. 07/2013: documented on prior labs.  . Obesity   . OSA (obstructive sleep apnea) 11/14/2015  . PAF (paroxysmal atrial fibrillation) ([place])    a. Flecainide discontinued 07/2013 in setting of CAD.; b. s/p PVI Ablation at [facility] (Dr Joseph Berkshire) 11/2014  . Pneumonia ~ 2010 X 1  . Pulmonary embolism ([place])    a. 2010 in setting of DVT post-op back surgery.  b. Low prob VQ 07/2013.  [name] Spinal stenosis    [place]  . Transaminitis    a. 07/2013: mild.  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 ANTERIOR LUMBAR [place] ARTHROPLASTY  03/2008   "spacer poped out; had to repair"  . CATARACT EXTRACTION W/ INTRAOCULAR LENS  IMPLANT, BILATERAL Bilateral   . COLONOSCOPY W/ POLYPECTOMY  2004  . Colonoscopy with polypectomy  09/2011   2 tubular adenomas  . CORONARY ANGIOPLASTY WITH STENT PLACEMENT  08/08/2013   "1"  . JOINT REPLACEMENT    . LEFT AND RIGHT HEART CATHETERIZATION WITH CORONARY ANGIOGRAM N/A 08/07/2013   Procedure: LEFT AND RIGHT HEART CATHETERIZATION WITH CORONARY ANGIOGRAM;  Surgeon: Peter M Martinique, MD;  Location: [facility] CATH LAB;  Service: Cardiovascular;  Laterality: N/A;  . LUMBAR FUSION  02/2008  . PERCUTANEOUS CORONARY STENT INTERVENTION (PCI-S)  08/07/2013   Procedure: PERCUTANEOUS CORONARY STENT INTERVENTION (PCI-S);  Surgeon: Peter M Martinique, MD;  Location: [facility] CATH LAB;  Service: Cardiovascular;;  . PVI ablation  11/2014   Dr. Venita Sheffield [facility]  . RIGHT/LEFT HEART CATH AND CORONARY ANGIOGRAPHY N/A 08/15/2018   Procedure: RIGHT/LEFT HEART CATH AND CORONARY ANGIOGRAPHY;  Surgeon: Nelva Bush, MD;  Location: [facility] CV LAB;  Service: Cardiovascular;  Laterality: N/A;  . TOTAL HIP ARTHROPLASTY Bilateral   . VASECTOMY       Current Meds  Medication Sig  . amLODipine (NORVASC) 5 MG tablet TAKE 1 TABLET DAILY (NEED OFFICE VISIT FOR FURTHER REFILLS) (Patient taking differently: Take 5 mg by mouth daily. )  . aspirin 81 MG chewable tablet Chew 81 mg by mouth daily.   [name] atorvastatin (LIPITOR) 10 MG tablet TAKE 1 TABLET EVERY EVENING (Patient taking differently: Take 10 mg by mouth daily. )  . Cholecalciferol (VITAMIN D3 PO) Take 1 capsule by mouth daily.  Derrill Memo ON 08/17/2018] metFORMIN (GLUCOPHAGE) 500 MG tablet Take 1 tablet (500 mg total) by mouth 2 (two) times a day.  .  nitroGLYCERIN (NITROSTAT) 0.4 MG SL tablet Place 1 tablet (0.4 mg total) under the tongue every 5 (five) minutes as needed for chest pain (up to 3 doses).  . pantoprazole (PROTONIX) 40 MG tablet TAKE 1 TABLET DAILY (Patient taking differently: Take 40 mg by mouth daily. )  . Respiratory Therapy Supplies (FLUTTER) DEVI Use as directed  . venlafaxine XR (EFFEXOR-XR) 75 MG 24 hr capsule TAKE 1 CAPSULE DAILY WITH BREAKFAST (Patient taking differently: Take 75 mg by mouth daily with breakfast. )  . vitamin B-12 (CYANOCOBALAMIN) 1000 MCG tablet Take 1,000 mcg by mouth daily.  [name] warfarin (COUMADIN) 3 MG tablet TAKE 2 TABLETS DAILY OR AS DIRECTED BY COUMADIN CLINIC (Patient taking differently: Take  3 mg by mouth daily. )     Allergies:   Penicillins   Social History   Tobacco Use  . Smoking status: Former Smoker    Packs/day: 2.00    Years: 40.00    Pack years: 80.00    Types: Cigarettes    Last attempt to quit: 03/09/2000    Years since quitting: 18.4  . Smokeless tobacco: Never Used  . Tobacco comment: smoked 1964-2002, up to 3 ppd  Substance Use Topics  . Alcohol use: Yes    Alcohol/week: 2.0 - 3.0 standard drinks    Types: 2 - 3 Cans of beer per week    Comment: drinks beer several times weekly  . Drug use: No     Family Hx: The patient's family history includes Alcohol abuse (age of onset: 52) in his brother; Atrial fibrillation in his mother; COPD in his mother; Colon cancer in his father; Heart attack in his paternal grandfather and paternal uncle; Heart attack (age of onset: 25) in his brother; Heart disease in his father. There is no history of Stroke.  ROS:   Please see the history of present illness.    As stated in the HPI and negative for all other systems.   Prior CV studies:   The following studies were reviewed today:  CATH  6/8     1. Mild to moderate coronary artery disease predominantly involving the LAD. 2. Patent stent in the proximal LAD. 3. Upper normal to  mildly elevated left and right heart filling pressures. 4. No significant LVOT gradient at rest or with provocation (post-PVC). 5. Normal Fick cardiac output/index.   Labs/Other Tests and Data Reviewed:    EKG:  NSR, rate 89, axis within normal limits, intervals within normal limits, no acute ST-T wave changes.  Recent Labs: 08/24/2017: ALT 27 08/09/2018: BUN 13; Creatinine, Ser 1.07; Platelets 460 08/15/2018: Hemoglobin 10.5; Potassium 4.3; Sodium 142   Recent Lipid Panel Lab Results  Component Value Date/Time   CHOL 113 08/24/2017 02:59 PM   TRIG 93.0 08/24/2017 02:59 PM   HDL 46.10 08/24/2017 02:59 PM   CHOLHDL 2 08/24/2017 02:59 PM   LDLCALC 48 08/24/2017 02:59 PM    Wt Readings from Last 3 Encounters:  08/16/18 270 lb (122.5 kg)  08/15/18 270 lb (122.5 kg)  08/09/18 279 lb 3.2 oz (126.6 kg)     Objective:    Vital Signs:  BP (!) 160/90   Pulse 82   Ht 6\' 2"  (1.88 m)   Wt 270 lb (122.5 kg)   BMI 34.67 kg/m    VITAL SIGNS:  reviewed  ASSESSMENT & PLAN:    Unstable angina CAD s/p PCI/DES to LAD in 2015:  He had patent coronaries as described.  No change in therapy.  He is getting an echocardiogram and has been scheduled.  No med changes at this point.  He can continue with risk reduction.  Paroxysmal atrial fibrillation:  He is going to look into whether he can afford Eliquis.  For now is been to restart his Coumadin and will have appropriate follow-up. CHA2DS2-VASc Score of 3 (HTN, Vascular, and Age 35-74).  HTN:  The blood pressure is elevated today but this is unusual.    HLD:  He will continue meds as listed.   AAA:  This was 3.8 cm previously and I will follow-up after the next appointment.  TACHYCARDIA: Also a 3-day ZIO patch.  OSA: He is on CPAP followed by Dr. Halford Chessman  OBESITY: He  and I have talked at length about weight loss and diet strategies.  COVID-19 Education: The signs and symptoms of COVID-19 were discussed with the patient and how to seek  care for testing (follow up with PCP or arrange E-visit).  The importance of social distancing was discussed today.  Time:   Today, I have spent 25 minutes with the patient with telehealth technology discussing the above problems.     Medication Adjustments/Labs and Tests Ordered: Current medicines are reviewed at length with the patient today.  Concerns regarding medicines are outlined above.   Tests Ordered: No orders of the defined types were placed in this encounter.   Medication Changes: No orders of the defined types were placed in this encounter.   Disposition:  Follow up with me in two months in the office.   Signed, Minus Breeding, MD  08/16/2018 9:33 AM    [facility]

## 2018-08-16 NOTE — Telephone Encounter (Signed)
Called patient, went straight to voicemail- left message to check in on patient after recent cath yesterday.  Left call back number.

## 2018-08-16 NOTE — Patient Instructions (Signed)
Medication Instructions:  Continue current medications  If you need a refill on your cardiac medications before your next appointment, please call your pharmacy.  Labwork: None Ordered   Testing/Procedures: Your physician has recommended that you wear a Zio Patch monitor. Zio patch monitors are medical devices that record the heart's electrical activity. Doctors most often use these monitors to diagnose arrhythmias. Arrhythmias are problems with the speed or rhythm of the heartbeat. The monitor is a small, portable device. You can wear one while you do your normal daily activities. This is usually used to diagnose what is causing palpitations/syncope (passing out).   Follow-Up: You will need a follow up appointment in 4 months.  Please call our office 2 months in advance to schedule this appointment.  You may see Minus Breeding, MD or one of the following Advanced Practice Providers on your designated Care Team:   Rosaria Ferries, PA-C . Jory Sims, DNP, ANP      At [facility], you and your health needs are our priority.  As part of our continuing mission to provide you with exceptional heart care, we have created designated Provider Care Teams.  These Care Teams include your primary Cardiologist (physician) and Advanced Practice Providers (APPs -  Physician Assistants and Nurse Practitioners) who all work together to provide you with the care you need, when you need it.  Thank you for choosing [facility] at [facility]!!

## 2018-08-16 NOTE — Telephone Encounter (Signed)
LVM Home phone.  3 day ZIO XT long term holter monitor to be mailed to patients home.  Instructions reviewed briefly as they are included in the monitor kit.

## 2018-08-16 NOTE — Telephone Encounter (Signed)
Unsure if VM was working.  Contacted patient on cell phone and reviewed the same information.  Patient states home phone voice mail not working.

## 2018-08-17 NOTE — Telephone Encounter (Signed)
Patient has been seen by dr hochrein.

## 2018-08-18 ENCOUNTER — Telehealth (HOSPITAL_COMMUNITY): Payer: Self-pay | Admitting: *Deleted

## 2018-08-18 NOTE — Telephone Encounter (Signed)

## 2018-08-19 ENCOUNTER — Other Ambulatory Visit: Payer: Self-pay

## 2018-08-19 ENCOUNTER — Ambulatory Visit (HOSPITAL_COMMUNITY): Payer: Medicare Other | Attending: Cardiology

## 2018-08-19 DIAGNOSIS — R0609 Other forms of dyspnea: Secondary | ICD-10-CM | POA: Insufficient documentation

## 2018-08-19 DIAGNOSIS — I200 Unstable angina: Secondary | ICD-10-CM | POA: Diagnosis not present

## 2018-08-21 ENCOUNTER — Ambulatory Visit (INDEPENDENT_AMBULATORY_CARE_PROVIDER_SITE_OTHER): Payer: Medicare Other

## 2018-08-21 DIAGNOSIS — R002 Palpitations: Secondary | ICD-10-CM

## 2018-08-21 DIAGNOSIS — R000 Tachycardia, unspecified: Secondary | ICD-10-CM

## 2018-08-23 ENCOUNTER — Telehealth: Payer: Self-pay

## 2018-08-23 ENCOUNTER — Telehealth: Payer: Self-pay | Admitting: Cardiology

## 2018-08-23 NOTE — Telephone Encounter (Signed)
Spoke with pt wife, aware the patient was called today. Results discussed with the wife.

## 2018-08-23 NOTE — Telephone Encounter (Signed)
Wife called because she hasn't heard anything about the results of her husband's Echo that was done on Friday 06/12

## 2018-08-23 NOTE — Telephone Encounter (Signed)

## 2018-08-29 ENCOUNTER — Telehealth: Payer: Self-pay | Admitting: Cardiology

## 2018-08-29 NOTE — Telephone Encounter (Signed)
rror

## 2018-08-30 ENCOUNTER — Other Ambulatory Visit: Payer: Self-pay | Admitting: Internal Medicine

## 2018-08-30 ENCOUNTER — Encounter: Payer: Self-pay | Admitting: Cardiology

## 2018-08-30 ENCOUNTER — Other Ambulatory Visit: Payer: Self-pay

## 2018-08-30 ENCOUNTER — Ambulatory Visit (INDEPENDENT_AMBULATORY_CARE_PROVIDER_SITE_OTHER): Payer: Medicare Other | Admitting: Pharmacist Clinician (PhC)/ Clinical Pharmacy Specialist

## 2018-08-30 ENCOUNTER — Ambulatory Visit (INDEPENDENT_AMBULATORY_CARE_PROVIDER_SITE_OTHER): Payer: Medicare Other | Admitting: Medical

## 2018-08-30 VITALS — BP 156/80 | HR 93 | Wt 280.0 lb

## 2018-08-30 DIAGNOSIS — Z7901 Long term (current) use of anticoagulants: Secondary | ICD-10-CM | POA: Diagnosis not present

## 2018-08-30 DIAGNOSIS — I480 Paroxysmal atrial fibrillation: Secondary | ICD-10-CM

## 2018-08-30 DIAGNOSIS — I10 Essential (primary) hypertension: Secondary | ICD-10-CM | POA: Diagnosis not present

## 2018-08-30 DIAGNOSIS — I714 Abdominal aortic aneurysm, without rupture, unspecified: Secondary | ICD-10-CM

## 2018-08-30 DIAGNOSIS — Z86718 Personal history of other venous thrombosis and embolism: Secondary | ICD-10-CM

## 2018-08-30 DIAGNOSIS — I2510 Atherosclerotic heart disease of native coronary artery without angina pectoris: Secondary | ICD-10-CM

## 2018-08-30 DIAGNOSIS — I200 Unstable angina: Secondary | ICD-10-CM

## 2018-08-30 DIAGNOSIS — R0609 Other forms of dyspnea: Secondary | ICD-10-CM | POA: Diagnosis not present

## 2018-08-30 LAB — POCT INR: INR: 3 (ref 2.0–3.0)

## 2018-08-30 MED ORDER — METOPROLOL TARTRATE 25 MG PO TABS: 25.0000 mg | ORAL_TABLET | Freq: Two times a day (BID) | ORAL | 0 refills | Status: DC

## 2018-08-30 MED ORDER — METOPROLOL TARTRATE 25 MG PO TABS: 25.0000 mg | ORAL_TABLET | Freq: Two times a day (BID) | ORAL | 3 refills | Status: DC

## 2018-08-30 NOTE — Patient Instructions (Signed)
Medication Instructions:   START METOPROLOL TARTRATE 25 MG 2 TIMES A DAY    If you need a refill on your cardiac medications before your next appointment, please call your pharmacy.   Lab work:  NONE ordered at this time of appointment   If you have labs (blood work) drawn today and your tests are completely normal, you will receive your results only by: [name] [name] MyChart Message (if you have MyChart) OR . A paper copy in the mail If you have any lab test that is abnormal or we need to change your treatment, we will call you to review the results.  Testing/Procedures:  NONE ordered at this time of appointment   Follow-Up: At [facility], you and your health needs are our priority.  As part of our continuing mission to provide you with exceptional heart care, we have created designated Provider Care Teams.  These Care Teams include your primary Cardiologist (physician) and Advanced Practice Providers (APPs -  Physician Assistants and Nurse Practitioners) who all work together to provide you with the care you need, when you need it. You will need a follow up appointment in 2 months.  Please call our office 2 months in advance to schedule this appointment.  You may see [name] Breeding, MD or one of the following Advanced Practice Providers on your designated Care Team:   [name] Ferries, PA-C . [name] Sims, DNP, ANP  Any Other Special Instructions Will Be Listed Below (If Applicable).      Heart Failure Eating Plan Heart failure, also called congestive heart failure, occurs when your heart does not pump blood well enough to meet your body's needs for oxygen-rich blood. Heart failure is a long-term (chronic) condition. Living with heart failure can be challenging. However, following your health care provider's instructions about a healthy lifestyle and working with a diet and nutrition specialist (dietitian) to choose the right foods may help to improve your symptoms. What are tips for  following this plan? General guidelines  Do not eat more than 2,300 mg of salt (sodium) a day. The amount of sodium that is recommended for you may be lower, depending on your condition.  Maintain a healthy body weight as directed. Ask your health care provider what a healthy weight is for you. ? Check your weight every day. ? Work with your health care provider and dietitian to make a plan that is right for you to lose weight or maintain your current weight.  Limit how much fluid you drink. Ask your health care provider or dietitian how much fluid you can have each day.  Limit or avoid alcohol as told by your health care provider or dietitian. Reading food labels  Check food labels for the amount of sodium per serving. Choose foods that have less than 140 mg (milligrams) of sodium in each serving.  Check food labels for the number of calories per serving. This is important if you need to limit your daily calorie intake to lose weight.  Check food labels for the serving size. If you eat more than one serving, you will be eating more sodium and calories than what is listed on the label.  Look for foods that are labeled as "sodium-free," "very low sodium," or "low sodium." ? Foods labeled as "reduced sodium" or "lightly salted" may still have more sodium than what is recommended for you. Cooking  Avoid adding salt when cooking. Ask your health care provider or dietitian before using salt substitutes.  Season food with  salt-free seasonings, spices, or herbs. Check the label of seasoning mixes to make sure they do not contain salt.  Cook with heart-healthy oils, such as olive, canola, soybean, or sunflower oil.  Do not fry foods. Cook foods using low-fat methods, such as baking, boiling, grilling, and broiling.  Limit unhealthy fats when cooking by: ? Removing the skin from poultry, such as chicken. ? Removing all visible fats from meats. ? Skimming the fat off from stews, soups, and  gravies before serving them. Meal planning   Limit your intake of: ? Processed, canned, or pre-packaged foods. ? Foods that are high in trans fat, such as fried foods. ? Sweets, desserts, sugary drinks, and other foods with added sugar. ? Full-fat dairy products, such as whole milk.  Eat a balanced diet that includes: ? 4-5 servings of fruit each day and 4-5 servings of vegetables each day. At each meal, try to fill half of your plate with fruits and vegetables. ? Up to 6-8 servings of whole grains each day. ? Up to 2 servings of lean meat, poultry, or fish each day. One serving of meat is equal to 3 oz. This is about the same size as a deck of cards. ? 2 servings of low-fat dairy each day. ? Heart-healthy fats. Healthy fats called omega-3 fatty acids are found in foods such as flaxseed and cold-water fish like sardines, salmon, and mackerel.  Aim to eat 25-35 g (grams) of fiber a day. Foods that are high in fiber include apples, broccoli, carrots, beans, peas, and whole grains.  Do not add salt or condiments that contain salt (such as soy sauce) to foods before eating.  When eating at a restaurant, ask that your food be prepared with less salt or no salt, if possible.  Try to eat 2 or more vegetarian meals each week.  Eat more home-cooked food and eat less restaurant, buffet, and fast food. Recommended foods The items listed may not be a complete list. Talk with your dietitian about what dietary choices are best for you. Grains Bread with less than 80 mg of sodium per slice. Whole-wheat pasta, quinoa, and brown rice. Oats and oatmeal. Barley. [place]. Grits and cream of wheat. Whole-grain and whole-wheat cold cereal. Vegetables All fresh vegetables. Vegetables that are frozen without sauce or added salt. Low-sodium or sodium-free canned vegetables. Fruits All fresh, frozen, and canned fruits. Dried fruits, such as raisins, prunes, and cranberries. Meats and other protein foods Lean  cuts of meat. Skinless chicken and [place]. Fish with high omega-3 fatty acids, such as salmon, sardines, and other cold-water fishes. Eggs. Dried beans, peas, and edamame. Unsalted nuts and nut butters. Dairy Low-fat or nonfat (skim) milk and dried milk. Rice milk, soy milk, and almond milk. Low-fat or nonfat yogurt. Small amounts of reduced-sodium block cheese. Low-sodium cottage cheese. Fats and oils Olive, canola, soybean, flaxseed, or sunflower oil. Avocado. Sweets and desserts Apple sauce. Granola bars. Sugar-free pudding and gelatin. Frozen fruit bars. Seasoning and other foods Fresh and dried herbs. Lemon or lime juice. Vinegar. Low-sodium ketchup. Salt-free marinades, salad dressings, sauces, and seasonings. Foods to avoid The items listed may not be a complete list. Talk with your dietitian about what dietary choices are best for you. Grains Bread with more than 80 mg of sodium per slice. Hot or cold cereal with more than 140 mg sodium per serving. Salted pretzels and crackers. Pre-packaged breadcrumbs. Bagels, croissants, and biscuits. Vegetables Canned vegetables. Frozen vegetables with sauce or seasonings. Creamed vegetables.  [place] fries. Onion rings. Pickled vegetables and sauerkraut. Fruits Fruits that are dried with sodium-containing preservatives. Meats and other protein foods Ribs and chicken wings. Bacon, ham, pepperoni, bologna, salami, and packaged luncheon meats. Hot dogs, bratwurst, and sausage. Canned meat. Smoked meat and fish. Salted nuts and seeds. Dairy Whole milk, half-and-half, and cream. Buttermilk. Processed cheese, cheese spreads, and cheese curds. Regular cottage cheese. Feta cheese. Shredded cheese. String cheese. Fats and oils Butter, lard, shortening, ghee, and bacon fat. Canned and packaged gravies. Seasoning and other foods Onion salt, garlic salt, table salt, and sea salt. Marinades. Regular salad dressings. Relishes, pickles, and olives. Meat  flavorings and tenderizers, and bouillon cubes. Horseradish, ketchup, and mustard. Worcestershire sauce. Teriyaki sauce, soy sauce (including reduced sodium). Hot sauce and Tabasco sauce. Steak sauce, fish sauce, oyster sauce, and cocktail sauce. Taco seasonings. Barbecue sauce. Tartar sauce. Summary  A heart failure eating plan includes changes that limit your intake of sodium and unhealthy fat, and it may help you lose weight or maintain a healthy weight. Your health care provider may also recommend limiting how much fluid you drink.  Most people with heart failure should eat no more than 2,300 mg of salt (sodium) a day. The amount of sodium that is recommended for you may be lower, depending on your condition.  Contact your health care provider or dietitian before making any major changes to your diet. This information is not intended to replace advice given to you by your health care provider. Make sure you discuss any questions you have with your health care provider. Document Released: 07/10/2016 Document Revised: 07/10/2016 Document Reviewed: 07/10/2016 Elsevier Interactive Patient Education  2019 [employer].

## 2018-08-30 NOTE — Progress Notes (Signed)
Cardiology Office Note   Date:  08/30/2018   ID:  ELDEAN [name], DOB March 17, 1945, MRN [id number]  PCP:  [name] Rail, MD  Cardiologist:  [name] Breeding, MD EP: None  Chief Complaint  Patient presents with  . Follow-up    Dypsnea on exertion      History of Present Illness: [name] [name] is a 73 y.o. male with a PMH of CAD s/p PCI/DES to LAD 07/2013, atrial fibrillation s/p ablation at the [facility] in 2016 but remains on coumadin due to ongoing paroxysmal atrial fibrillation, hypertension, AAA, DVT with PE (provoked following surgery ~13yr ago), along with OSA and COPD followed by Dr.Sood.   He has been seen several times this month for DOE and tachycardia with minimal activity. He underwent a LHC which revealed a patent prox LAD stent and mild-moderate CAD predominantly involving the LAD; recommended for medical management and secondary prevention. He had an echocardiogram which revealed EF >65%, G1DD, no RWMA, and mild dilation of the ascending aorta (26mm). He was seen in follow-up by Dr. Percival Spanish 08/16/2018 and recommended to undergo a 3 day Zio patch monitor to evaluate his tachycardia which has not been completed yet.   He returns today for follow-up of his DOE and tachycardia. He is asking about the results of his zio patch monitor, however they are not back yet. He expresses frustration that the work-up has not revealed a clear cause of his DOE/tachycardia. We reviewed his catheterization and echocardiogram reports. We discussed the importance of maintaining a low sodium diet. He continues to have DOE and tachycardia with exertion. He reports being on metoprolol in the past but does not recall when and why it was stopped. He reports not checking his BP regularly but when he does it is frequently in the yellow/red zones on his monitor (he does not recall the corresponding numbers). No complaints of chest pain, SOB at rest, irregular heart beats, dizziness, lightheadedness,  or syncope. He is planning another trip to [place] next month and will follow-up with his EP doctor (Dr. Thomasene [name]) while there.    Past Medical History:  Diagnosis Date  . Arthritis   . Asthma   . BPH (benign prostatic hypertrophy)   . CAD (coronary artery disease)    a. 07/2013: s/p DES to LAD, normal LVF.  [name] Complication of anesthesia    "I got all kinds of hallucinations"  . COPD (chronic obstructive pulmonary disease) ([place])    a. 07/2013 PFT's mild airflow obstruction, no restriction, sev decrease in DLCO.  . Diverticulosis   . DVT (deep venous thrombosis) ([place])    a. 2010 Lower ext s/p back surgery.  [name] Dyspnea on exertion    a. 07/2013 PFT's mild airflow obstr   . GERD (gastroesophageal reflux disease)   . History of gout   . History of hiatal hernia   . Hx of echocardiogram 2015   Echo (06/2013): EF 60-65% normal wall motion, normal diastolic function, aortic sclerosis without stenosis, Trivial MR, mild SAM due to long, redundant mitral leaflets, mild RAE, normal RVSF  . Hypertension   . Macrocytic anemia    a. 07/2013: documented on prior labs.  . Obesity   . OSA (obstructive sleep apnea) 11/14/2015  . PAF (paroxysmal atrial fibrillation) ([place])    a. Flecainide discontinued 07/2013 in setting of CAD.; b. s/p PVI Ablation at [facility] (Dr [name] [name]) 11/2014  . Pneumonia ~ 2010 X 1  . Pulmonary embolism ([place])  a. 2010 in setting of DVT post-op back surgery. b. Low prob VQ 07/2013.  [name] Spinal stenosis    [place]  . Transaminitis    a. 07/2013: mild.  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 ANTERIOR LUMBAR [place] ARTHROPLASTY  03/2008   "spacer poped out; had to repair"  . CATARACT EXTRACTION W/ INTRAOCULAR LENS  IMPLANT, BILATERAL Bilateral   . COLONOSCOPY W/ POLYPECTOMY  2004  . Colonoscopy with polypectomy  09/2011   2 tubular adenomas  . CORONARY ANGIOPLASTY WITH STENT PLACEMENT  08/08/2013   "1"  . JOINT REPLACEMENT     . LEFT AND RIGHT HEART CATHETERIZATION WITH CORONARY ANGIOGRAM N/A 08/07/2013   Procedure: LEFT AND RIGHT HEART CATHETERIZATION WITH CORONARY ANGIOGRAM;  Surgeon: Peter M Martinique, MD;  Location: [facility] CATH LAB;  Service: Cardiovascular;  Laterality: N/A;  . LUMBAR FUSION  02/2008  . PERCUTANEOUS CORONARY STENT INTERVENTION (PCI-S)  08/07/2013   Procedure: PERCUTANEOUS CORONARY STENT INTERVENTION (PCI-S);  Surgeon: Peter M Martinique, MD;  Location: [facility] CATH LAB;  Service: Cardiovascular;;  . PVI ablation  11/2014   Dr. Venita Sheffield [facility]  . RIGHT/LEFT HEART CATH AND CORONARY ANGIOGRAPHY N/A 08/15/2018   Procedure: RIGHT/LEFT HEART CATH AND CORONARY ANGIOGRAPHY;  Surgeon: [name] Bush, MD;  Location: [facility] CV LAB;  Service: Cardiovascular;  Laterality: N/A;  . TOTAL HIP ARTHROPLASTY Bilateral   . VASECTOMY       Current Outpatient Medications  Medication Sig Dispense Refill  . amLODipine (NORVASC) 5 MG tablet TAKE 1 TABLET DAILY (NEED OFFICE VISIT FOR FURTHER REFILLS) (Patient taking differently: Take 5 mg by mouth daily. ) 90 tablet 0  . aspirin 81 MG chewable tablet Chew 81 mg by mouth daily.     [name] atorvastatin (LIPITOR) 10 MG tablet TAKE 1 TABLET EVERY EVENING (Patient taking differently: Take 10 mg by mouth daily. ) 90 tablet 4  . Cholecalciferol (VITAMIN D3 PO) Take 1 capsule by mouth daily.    . metFORMIN (GLUCOPHAGE) 500 MG tablet Take 1 tablet (500 mg total) by mouth 2 (two) times a day.    . nitroGLYCERIN (NITROSTAT) 0.4 MG SL tablet Place 1 tablet (0.4 mg total) under the tongue every 5 (five) minutes as needed for chest pain (up to 3 doses). 25 tablet 4  . pantoprazole (PROTONIX) 40 MG tablet TAKE 1 TABLET DAILY (Patient taking differently: Take 40 mg by mouth daily. ) 90 tablet 4  . Respiratory Therapy Supplies (FLUTTER) DEVI Use as directed 1 each 0  . venlafaxine XR (EFFEXOR-XR) 75 MG 24 hr capsule TAKE 1 CAPSULE DAILY WITH BREAKFAST (Patient taking differently: Take  75 mg by mouth daily with breakfast. ) 90 capsule 4  . vitamin B-12 (CYANOCOBALAMIN) 1000 MCG tablet Take 1,000 mcg by mouth daily.    [name] warfarin (COUMADIN) 3 MG tablet TAKE 2 TABLETS DAILY OR AS DIRECTED BY COUMADIN CLINIC (Patient taking differently: Take 3 mg by mouth daily. ) 180 tablet 1  . metoprolol tartrate (LOPRESSOR) 25 MG tablet Take 1 tablet (25 mg total) by mouth 2 (two) times daily. 180 tablet 3   No current facility-administered medications for this visit.     Allergies:   Penicillins    Social History:  The patient  reports that he quit smoking about 18 years ago. His smoking use included cigarettes. He has a 80.00 pack-year smoking history. He has never used smokeless tobacco. He reports current alcohol use  of about 2.0 - 3.0 standard drinks of alcohol per week. He reports that he does not use drugs.   Family History:  The patient's family history includes Alcohol abuse (age of onset: 31) in his brother; Atrial fibrillation in his mother; COPD in his mother; Colon cancer in his father; Heart attack in his paternal grandfather and paternal uncle; Heart attack (age of onset: 23) in his brother; Heart disease in his father.    ROS:  Please see the history of present illness.   Otherwise, review of systems are positive for none.   All other systems are reviewed and negative.    PHYSICAL EXAM: VS:  BP (!) 156/80   Pulse 93   Wt 280 lb (127 kg)   BMI 35.95 kg/m  , BMI Body mass index is 35.95 kg/m. GEN: Well nourished, well developed, in no acute distress HEENT: normal Neck: no JVD, carotid bruits, or masses Cardiac: RRR; no murmurs, rubs, or gallops, trace LLE edema  Respiratory: clear to auscultation bilaterally, normal work of breathing GI: soft, nontender, nondistended, + BS MS: no deformity or atrophy Skin: warm and dry, no rash Neuro:  Strength and sensation are intact Psych: euthymic mood, full affect   EKG:  EKG is not ordered today.   Recent Labs:  08/09/2018: BUN 13; Creatinine, Ser 1.07; Platelets 460 08/15/2018: Hemoglobin 10.5; Potassium 4.3; Sodium 142    Lipid Panel    Component Value Date/Time   CHOL 113 08/24/2017 1459   TRIG 93.0 08/24/2017 1459   HDL 46.10 08/24/2017 1459   CHOLHDL 2 08/24/2017 1459   VLDL 18.6 08/24/2017 1459   LDLCALC 48 08/24/2017 1459      Wt Readings from Last 3 Encounters:  08/30/18 280 lb (127 kg)  08/16/18 270 lb (122.5 kg)  08/15/18 270 lb (122.5 kg)      Other studies Reviewed: Additional studies/ records that were reviewed today include:  LHC 08/15/2018: Conclusions: 1. Mild to moderate coronary artery disease predominantly involving the LAD. 2. Patent stent in the proximal LAD. 3. Upper normal to mildly elevated left and right heart filling pressures. 4. No significant LVOT gradient at rest or with provocation (post-PVC). 5. Normal Fick cardiac output/index.  Recommendations: 1. Continue medical therapy and secondary prevention. 2. Consider evaluation for other potential causes of dyspnea on exertion and tachycardia.  Echocardiogram 08/19/2018: IMPRESSIONS    1. The left ventricle has hyperdynamic systolic function, with an ejection fraction of >65%. The cavity size was normal. There is mildly increased left ventricular wall thickness. Left ventricular diastolic Doppler parameters are consistent with  impaired relaxation. No evidence of left ventricular regional wall motion abnormalities.  2. The right ventricle has normal systolic function. The cavity was normal. There is no increase in right ventricular wall thickness.  3. Mild calcification of the mitral valve leaflet. No evidence of mitral valve stenosis. No mitral regurgitation. There was chordal but not valvular systolic anterior motion.  4. The aortic valve is tricuspid. Mild calcification of the aortic valve. No stenosis of the aortic valve.  5. There is mild dilatation of the ascending aorta measuring 40 mm.  6. Normal  IVC size. No complete TR doppler jet so unable to estimate PA systolic pressure.    ASSESSMENT AND PLAN:  1. DOE with associated tachycardia with minimal activity: recent work-up including LHC and echo are reassuring. He underwent a 3 day zio patch monitor, however results are not back yet. He appears euvolemic on exam today - Will follow-up  cardiac monitor results - if unrevealing, may need to follow-up with pulmonology for further evaluation - Will start metoprolol tartrate 25mg  BID   2. CAD s/p PCI/DES to LAD in 2015: [place] 08/15/2018 with patent pLAD stent and mild RCA disease and moderate mid LAD disease. No chest pain complaints - Continue aspirin and statin - Will start metoprolol tartrate 25mg  BID  3. Paroxysmal atrial fibrillation: s/p ablation in 2016. Feels like #1 may be related to recurrent Afib. Recent cardiac monitor results are not back yet. HR is regular in clinic today. CHA2DS2-VASc Score of 3 (HTN, Vascular, and Age 9-74) and he is on coumadin for stroke ppx. INR 3 today. We discussed switching to apixaban and he wishes to check with his insurance to determine copay prior to transition.  - Continue coumadin for stroke ppx - Will start metoprolol tartrate 25mg  BID  4. HTN: BP elevated today to 156/80. He reports readings at home in the yellow/red zone though does not recall corresponding numbers. - Continue amlodipine - Will start metoprolol tartrate 25mg  BID  5. HLD: LDL 48 08/2017 - Continue atorvastatin  6. Ascending and abdominal aortic aneurysms: 29mm and 11mm respectively on echo 08/19/2018 and abdominal US 06/2017 - Continue routine monitoring with annual echocardiogram - Will repeat abdominal US now for routine monitoring.   7. OSA: followed by Dr. Halford Chessman - Continue CPAP  8 DM type 2: managed by PCP - Continue metformin   Current medicines are reviewed at length with the patient today.  The patient does not have concerns regarding medicines.  The following  changes have been made:  Start metoprolol 25mg  BID  Labs/ tests ordered today include: Will repeat AAA duplex. No orders of the defined types were placed in this encounter.    Disposition:   FU with Dr. Percival Spanish in 2 months  Signed, Abigail Butts, PA-C  08/30/2018 1:09 PM

## 2018-09-01 DIAGNOSIS — R002 Palpitations: Secondary | ICD-10-CM | POA: Diagnosis not present

## 2018-09-01 DIAGNOSIS — R000 Tachycardia, unspecified: Secondary | ICD-10-CM | POA: Diagnosis not present

## 2018-09-06 ENCOUNTER — Other Ambulatory Visit (HOSPITAL_COMMUNITY): Payer: Self-pay | Admitting: Medical

## 2018-09-06 ENCOUNTER — Other Ambulatory Visit: Payer: Self-pay | Admitting: Cardiology

## 2018-09-06 ENCOUNTER — Ambulatory Visit (HOSPITAL_COMMUNITY)
Admission: RE | Admit: 2018-09-06 | Discharge: 2018-09-06 | Disposition: A | Discharge status: Home / Self Care | Payer: Medicare Other | Source: Ambulatory Visit | Attending: Cardiology | Admitting: Cardiology

## 2018-09-06 ENCOUNTER — Other Ambulatory Visit: Payer: Self-pay

## 2018-09-06 DIAGNOSIS — I714 Abdominal aortic aneurysm, without rupture, unspecified: Secondary | ICD-10-CM

## 2018-09-06 DIAGNOSIS — R000 Tachycardia, unspecified: Secondary | ICD-10-CM

## 2018-09-06 DIAGNOSIS — D3132 Benign neoplasm of left choroid: Secondary | ICD-10-CM | POA: Diagnosis not present

## 2018-09-06 DIAGNOSIS — H10413 Chronic giant papillary conjunctivitis, bilateral: Secondary | ICD-10-CM | POA: Diagnosis not present

## 2018-09-06 DIAGNOSIS — R002 Palpitations: Secondary | ICD-10-CM

## 2018-09-06 DIAGNOSIS — Z961 Presence of intraocular lens: Secondary | ICD-10-CM | POA: Diagnosis not present

## 2018-09-06 NOTE — Progress Notes (Signed)
The patient has been notified of the result and verbalized understanding.  All questions (if any) were answered. Jacqulynn Cadet, CMA 09/06/2018 1:50 PM

## 2018-09-07 ENCOUNTER — Ambulatory Visit: Payer: Medicare Other | Admitting: Primary Care

## 2018-09-07 ENCOUNTER — Telehealth: Payer: Self-pay | Admitting: Gastroenterology

## 2018-09-07 NOTE — Telephone Encounter (Signed)
LVM to scheduled a colonoscopy and Pre Visit for Hx of colon polyps. On BT coumadin. Let Doctor CMA know when scheduled.

## 2018-09-08 ENCOUNTER — Encounter: Payer: Self-pay | Admitting: Gastroenterology

## 2018-09-14 ENCOUNTER — Telehealth: Payer: Self-pay | Admitting: Internal Medicine

## 2018-09-14 NOTE — Telephone Encounter (Signed)
Medication: amLODipine (NORVASC) 5 MG tablet    Patient is requesting refill of this medication.    Pharmacy:  [place], [place] - [address] [phone] (Phone) [phone] (Fax)

## 2018-09-14 NOTE — Telephone Encounter (Signed)
Rx refused. Need an appointment.

## 2018-09-15 MED ORDER — AMLODIPINE BESYLATE 5 MG PO TABS: 5.0000 mg | ORAL_TABLET | Freq: Every day | ORAL | 0 refills | Status: DC

## 2018-09-15 NOTE — Telephone Encounter (Signed)
Can you set pt up with an appointment when you get the chance. He needs to follow up with Dr. Quay Burow.

## 2018-09-15 NOTE — Telephone Encounter (Signed)
appt scheduled

## 2018-09-15 NOTE — Telephone Encounter (Signed)
Rx sent 

## 2018-09-18 NOTE — Progress Notes (Signed)
Subjective:    Patient ID: [name] [name], male    DOB: 12/21/1945, 73 y.o.   MRN: [id number]  HPI The patient is here for follow up.  He is not exercising regularly.     COPD, OSA:  She follows with pulmonary.  He uses his CPAP nightly.  He does have chronic shortness of breath and he feels that is slightly worse.  He has a chronic dry cough that is the same.  He denies any wheezing, fevers or chills.  He is fairly sedentary and has been more sedentary recently because of cardiac issues.  Prediabetes:  He is taking metformin as prescribed.  He is compliant with a low sugar/carbohydrate diet.  He is not exercising regularly.  CAD, Hypertension: He is taking his medication daily. He is compliant with a low sodium diet.  He denies chest pain, palpitations, edema and regular headaches.  Atrial fibrillation: He does have intermittent atrial fibrillation.  He has been feeling like he has been having atrial fibrillation episodes, but a Holter monitor here did not show it.  He is wearing a Holter monitor from [facility] clinic and will be going up there to see that his doctor there next week.  GERD:  He is taking his medication daily as prescribed.  He denies any GERD symptoms and feels his GERD is well controlled.   Hyperlipidemia: He is taking his medication daily. He is compliant with a low fat/cholesterol diet. He denies myalgias.   Insomnia:  He is not sleeping well.   4-5 nights a week it takes him 2-3 hrs to fall asleep.  We tried him on Lunesta, but this was too expensive-this was last year and we have not tried it again this year.  Medications and allergies reviewed with patient and updated if appropriate.  Patient Active Problem List   Diagnosis Date Noted  . Accelerating angina ([place]) 08/15/2018  . Olecranon bursitis of right elbow 07/25/2018  . Non-rheumatic mitral regurgitation 04/16/2017  . Bilateral leg edema 12/26/2016  . Anxiety 12/26/2016  . Scar tissue 11/02/2016  .  Plantar fasciitis 10/06/2016  . 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 Vitamin D deficiency 07/21/2016  . OSA (obstructive sleep apnea) 11/14/2015  . Prediabetes 04/17/2015  . COPD GOLD II 08/21/2014  . Hyperlipidemia 08/20/2014  . Spinal stenosis of lumbar region 05/30/2014  . Abnormal CT of the abdomen 05/30/2014  . Dizziness 02/08/2014  . Macrocytic anemia 08/08/2013  . CAD (coronary artery disease)   . AAA (abdominal aortic aneurysm) without rupture ([place]) 05/02/2013  . Obesity (BMI 30-39.9) 03/21/2013  . Family history of malignant neoplasm of gastrointestinal tract 08/25/2011  . 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 Fatigue 03/24/2011  . DOE (dyspnea on exertion) 03/12/2011  . Warfarin anticoagulation 03/11/2011  . INSOMNIA-SLEEP DISORDER-UNSPEC 09/12/2009  . CERVICAL RADICULOPATHY, RIGHT 07/24/2009  . DEGENERATIVE JOINT DISEASE, ADVANCED 05/21/2009  . AVASCULAR NECROSIS 05/21/2009  . ALLERGIC RHINITIS CAUSE UNSPECIFIED 11/01/2008  . PULMONARY NODULE, RIGHT LOWER LOBE 10/25/2008  . DIVERTICULOSIS, COLON 10/07/2008  . 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 PULMONARY EMBOLISM, HX OF 10/05/2008  . ASTHMA 02/20/2008  . HYPERPLASIA PROSTATE UNS W/UR OBST & OTH LUTS 06/28/2007  . PAF (paroxysmal atrial fibrillation) ([place]) 06/25/2007  . NOCTURIA 05/12/2007  . COLONIC POLYPS 04/01/2006  . GOUT 04/01/2006  . Essential hypertension 04/01/2006  . ACID REFLUX DISEASE 04/01/2006    Current Outpatient Medications on File Prior to Visit  Medication Sig Dispense Refill  . aspirin  81 MG chewable tablet Chew 81 mg by mouth daily.     [name] atorvastatin (LIPITOR) 10 MG tablet TAKE 1 TABLET EVERY EVENING (Patient taking differently: Take 10 mg by mouth daily. ) 90 tablet 4  . Cholecalciferol (VITAMIN D3 PO) Take 1 capsule by mouth daily.    . metFORMIN (GLUCOPHAGE) 500 MG tablet Take 1 tablet (500 mg total) by mouth 2 (two) times a day.    . metoprolol tartrate  (LOPRESSOR) 25 MG tablet Take 1 tablet (25 mg total) by mouth 2 (two) times daily. 180 tablet 3  . nitroGLYCERIN (NITROSTAT) 0.4 MG SL tablet Place 1 tablet (0.4 mg total) under the tongue every 5 (five) minutes as needed for chest pain (up to 3 doses). 25 tablet 4  . pantoprazole (PROTONIX) 40 MG tablet TAKE 1 TABLET DAILY (Patient taking differently: Take 40 mg by mouth daily. ) 90 tablet 4  . Respiratory Therapy Supplies (FLUTTER) DEVI Use as directed 1 each 0  . venlafaxine XR (EFFEXOR-XR) 75 MG 24 hr capsule TAKE 1 CAPSULE DAILY WITH BREAKFAST (Patient taking differently: Take 75 mg by mouth daily with breakfast. ) 90 capsule 4  . vitamin B-12 (CYANOCOBALAMIN) 1000 MCG tablet Take 1,000 mcg by mouth daily.    [name] warfarin (COUMADIN) 3 MG tablet TAKE 2 TABLETS DAILY OR AS DIRECTED BY COUMADIN CLINIC (Patient taking differently: Take 3 mg by mouth daily. ) 180 tablet 1   No current facility-administered medications on file prior to visit.     Past Medical History:  Diagnosis Date  . Arthritis   . Asthma   . BPH (benign prostatic hypertrophy)   . CAD (coronary artery disease)    a. 07/2013: s/p DES to LAD, normal LVF.  [name] Complication of anesthesia    "I got all kinds of hallucinations"  . COPD (chronic obstructive pulmonary disease) ([place])    a. 07/2013 PFT's mild airflow obstruction, no restriction, sev decrease in DLCO.  . Diverticulosis   . DVT (deep venous thrombosis) ([place])    a. 2010 Lower ext s/p back surgery.  [name] Dyspnea on exertion    a. 07/2013 PFT's mild airflow obstr   . GERD (gastroesophageal reflux disease)   . History of gout   . History of hiatal hernia   . Hx of echocardiogram 2015   Echo (06/2013): EF 60-65% normal wall motion, normal diastolic function, aortic sclerosis without stenosis, Trivial MR, mild SAM due to long, redundant mitral leaflets, mild RAE, normal RVSF  . Hypertension   . Macrocytic anemia    a. 07/2013: documented on prior labs.  . Obesity   . OSA  (obstructive sleep apnea) 11/14/2015  . PAF (paroxysmal atrial fibrillation) ([place])    a. Flecainide discontinued 07/2013 in setting of CAD.; b. s/p PVI Ablation at [facility] (Dr Joseph Berkshire) 11/2014  . Pneumonia ~ 2010 X 1  . Pulmonary embolism ([place])    a. 2010 in setting of DVT post-op back surgery. b. Low prob VQ 07/2013.  [name] Spinal stenosis    [place]  . Transaminitis    a. 07/2013: mild.  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 ANTERIOR LUMBAR [place] ARTHROPLASTY  03/2008   "spacer poped out; had to repair"  . CATARACT EXTRACTION W/ INTRAOCULAR LENS  IMPLANT, BILATERAL Bilateral   . COLONOSCOPY W/ POLYPECTOMY  2004  . Colonoscopy with polypectomy  09/2011   2 tubular adenomas  . CORONARY ANGIOPLASTY WITH STENT PLACEMENT  08/08/2013   "1"  . JOINT REPLACEMENT    . LEFT AND RIGHT HEART CATHETERIZATION WITH CORONARY ANGIOGRAM N/A 08/07/2013   Procedure: LEFT AND RIGHT HEART CATHETERIZATION WITH CORONARY ANGIOGRAM;  Surgeon: Peter M Martinique, MD;  Location[facility] LAB;  Service: Cardiovascular;  Laterality: N/A;  . LUMBAR FUSION  02/2008  . PERCUTANEOUS CORONARY STENT INTERVENTION (PCI-S)  08/07/2013   Procedure: PERCUTANEOUS CORONARY STENT INTERVENTION (PCI-S);  Surgeon: Peter M Martinique, MD;  Location: [facility] CATH LAB;  Service: Cardiovascular;;  . PVI ablation  11/2014   Dr. Venita Sheffield [facility]  . RIGHT/LEFT HEART CATH AND CORONARY ANGIOGRAPHY N/A 08/15/2018   Procedure: RIGHT/LEFT HEART CATH AND CORONARY ANGIOGRAPHY;  Surgeon: Nelva Bush, MD;  Location: [facility] CV LAB;  Service: Cardiovascular;  Laterality: N/A;  . TOTAL HIP ARTHROPLASTY Bilateral   . VASECTOMY      Social History   Socioeconomic History  . Marital status: Married    Spouse name: Not on file  . Number of children: 4  . Years of education: Not on file  . Highest education level: Not on file  Occupational History  . Occupation: Retired from [employer]: RETIRED  Social Needs  . Financial resource strain: Not hard at all  . Food insecurity    Worry: Never true    Inability: Never true  . Transportation needs    Medical: No    Non-medical: No  Tobacco Use  . Smoking status: Former Smoker    Packs/day: 2.00    Years: 40.00    Pack years: 80.00    Types: Cigarettes    Quit date: 03/09/2000    Years since quitting: 18.5  . Smokeless tobacco: Never Used  . Tobacco comment: smoked 1964-2002, up to 3 ppd  Substance and Sexual Activity  . Alcohol use: Yes    Alcohol/week: 2.0 - 3.0 standard drinks    Types: 2 - 3 Cans of beer per week    Comment: drinks beer several times weekly  . Drug use: No  . Sexual activity: Never    Comment: "sexually hx is none of your business" (02/08/2014)  Lifestyle  . Physical activity    Days per week: 0 days    Minutes per session: 0 min  . Stress: Only a little  Relationships  . Social connections    Talks on phone: More than three times a week    Gets together: More than three times a week    Attends religious service: Not on file    Active member of club or organization: Not on file    Attends meetings of clubs or organizations: Not on file    Relationship status: Married  Other Topics Concern  . Not on file  Social History Narrative  . Not on file    Family History  Problem Relation Age of Onset  . Heart attack Paternal Grandfather        Over 62  . Atrial fibrillation Mother   . COPD Mother   . Heart disease Father   . Colon cancer Father   . Heart attack Brother 42  . Alcohol abuse Brother 92       Died post liver transplant  . Heart attack Paternal Uncle        Less than 67  . Stroke Neg Hx     Review of Systems  Constitutional: Positive for fatigue (with elevated HR).  Respiratory: Positive for cough (chronic, stable) and  shortness of breath (more than usual). Negative for wheezing.   Cardiovascular: Negative for chest pain, palpitations and leg swelling.   Neurological: Positive for light-headedness (with elevated HR). Negative for headaches.       Objective:   Vitals:   09/19/18 1352  BP: 122/60  Pulse: 64  Resp: 16  Temp: 99.2 F (37.3 C)  SpO2: 96%   BP Readings from Last 3 Encounters:  09/19/18 122/60  08/30/18 (!) 156/80  08/16/18 (!) 160/90   Wt Readings from Last 3 Encounters:  09/19/18 280 lb (127 kg)  08/30/18 280 lb (127 kg)  08/16/18 270 lb (122.5 kg)   Body mass index is 35.95 kg/m.   Physical Exam    Constitutional: Appears well-developed and well-nourished. No distress.  HENT:  Head: Normocephalic and atraumatic.  Neck: Neck supple. No tracheal deviation present. No thyromegaly present.  No cervical lymphadenopathy Cardiovascular: Normal rate, regular rhythm and normal heart sounds.   No murmur heard. No carotid bruit .  No edema Pulmonary/Chest: Effort normal and breath sounds normal. No respiratory distress. No has no wheezes. No rales.  Skin: Skin is warm and dry. Not diaphoretic.  Psychiatric: Normal mood and affect. Behavior is normal.      Assessment & Plan:    See Problem List for Assessment and Plan of chronic medical problems.

## 2018-09-19 ENCOUNTER — Ambulatory Visit (INDEPENDENT_AMBULATORY_CARE_PROVIDER_SITE_OTHER): Payer: Medicare Other | Admitting: Internal Medicine

## 2018-09-19 ENCOUNTER — Other Ambulatory Visit: Payer: Self-pay

## 2018-09-19 ENCOUNTER — Other Ambulatory Visit (INDEPENDENT_AMBULATORY_CARE_PROVIDER_SITE_OTHER): Payer: Medicare Other

## 2018-09-19 ENCOUNTER — Encounter: Payer: Self-pay | Admitting: Internal Medicine

## 2018-09-19 VITALS — BP 122/60 | HR 64 | Temp 99.2°F | Resp 16 | Ht 74.0 in | Wt 280.0 lb

## 2018-09-19 DIAGNOSIS — I480 Paroxysmal atrial fibrillation: Secondary | ICD-10-CM

## 2018-09-19 DIAGNOSIS — E7849 Other hyperlipidemia: Secondary | ICD-10-CM | POA: Diagnosis not present

## 2018-09-19 DIAGNOSIS — G4709 Other insomnia: Secondary | ICD-10-CM | POA: Diagnosis not present

## 2018-09-19 DIAGNOSIS — I10 Essential (primary) hypertension: Secondary | ICD-10-CM | POA: Diagnosis not present

## 2018-09-19 DIAGNOSIS — R7303 Prediabetes: Secondary | ICD-10-CM

## 2018-09-19 DIAGNOSIS — G4733 Obstructive sleep apnea (adult) (pediatric): Secondary | ICD-10-CM

## 2018-09-19 DIAGNOSIS — I200 Unstable angina: Secondary | ICD-10-CM | POA: Diagnosis not present

## 2018-09-19 DIAGNOSIS — K219 Gastro-esophageal reflux disease without esophagitis: Secondary | ICD-10-CM | POA: Diagnosis not present

## 2018-09-19 LAB — COMPREHENSIVE METABOLIC PANEL
ALT: 28 U/L (ref 0–53)
AST: 29 U/L (ref 0–37)
Albumin: 4.4 g/dL (ref 3.5–5.2)
Alkaline Phosphatase: 78 U/L (ref 39–117)
BUN: 16 mg/dL (ref 6–23)
CO2: 27 mEq/L (ref 19–32)
Calcium: 9.1 mg/dL (ref 8.4–10.5)
Chloride: 104 mEq/L (ref 96–112)
Creatinine, Ser: 1.09 mg/dL (ref 0.40–1.50)
GFR: 66.04 mL/min (ref >60.00)
Glucose, Bld: 93 mg/dL (ref 70–99)
Potassium: 4.8 mEq/L (ref 3.5–5.1)
Sodium: 139 mEq/L (ref 135–145)
Total Bilirubin: 0.5 mg/dL (ref 0.2–1.2)
Total Protein: 7.5 g/dL (ref 6.0–8.3)

## 2018-09-19 LAB — CBC WITH DIFFERENTIAL/PLATELET
Basophils Absolute: 0.2 10*3/uL — ABNORMAL HIGH (ref 0.0–0.1)
Basophils Relative: 1.7 % (ref 0.0–3.0)
Eosinophils Absolute: 0.6 10*3/uL (ref 0.0–0.7)
Eosinophils Relative: 6.5 % — ABNORMAL HIGH (ref 0.0–5.0)
HCT: 33.9 % — ABNORMAL LOW (ref 39.0–52.0)
Hemoglobin: 11.2 g/dL — ABNORMAL LOW (ref 13.0–17.0)
Lymphocytes Relative: 29.4 % (ref 12.0–46.0)
Lymphs Abs: 2.9 10*3/uL (ref 0.7–4.0)
MCHC: 33.1 g/dL (ref 30.0–36.0)
MCV: 107 fl — ABNORMAL HIGH (ref 78.0–100.0)
Monocytes Absolute: 1 10*3/uL (ref 0.1–1.0)
Monocytes Relative: 9.9 % (ref 3.0–12.0)
Neutro Abs: 5.2 10*3/uL (ref 1.4–7.7)
Neutrophils Relative %: 52.5 % (ref 43.0–77.0)
Platelets: 497 10*3/uL — ABNORMAL HIGH (ref 150.0–400.0)
RBC: 3.16 Mil/uL — ABNORMAL LOW (ref 4.22–5.81)
RDW: 16.9 % — ABNORMAL HIGH (ref 11.5–15.5)
WBC: 9.9 10*3/uL (ref 4.0–10.5)

## 2018-09-19 LAB — LIPID PANEL
Cholesterol: 111 mg/dL (ref 0–200)
HDL: 36.1 mg/dL — ABNORMAL LOW (ref >39.00)
LDL Cholesterol: 39 mg/dL (ref 0–99)
NonHDL: 74.49
Total CHOL/HDL Ratio: 3
Triglycerides: 179 mg/dL — ABNORMAL HIGH (ref 0.0–149.0)
VLDL: 35.8 mg/dL (ref 0.0–40.0)

## 2018-09-19 LAB — HEMOGLOBIN A1C: Hgb A1c MFr Bld: 6.3 % (ref 4.6–6.5)

## 2018-09-19 MED ORDER — ESZOPICLONE 2 MG PO TABS: 2.0000 mg | ORAL_TABLET | Freq: Every evening | ORAL | 5 refills | Status: DC | PRN

## 2018-09-19 MED ORDER — AMLODIPINE BESYLATE 5 MG PO TABS: 5.0000 mg | ORAL_TABLET | Freq: Every day | ORAL | 0 refills | Status: DC

## 2018-09-19 NOTE — Assessment & Plan Note (Signed)
Following with pulmonary Using CPAP nightly

## 2018-09-19 NOTE — Assessment & Plan Note (Signed)
GERD controlled Continue daily medication  

## 2018-09-19 NOTE — Assessment & Plan Note (Signed)
Check lipid panel  Continue daily statin Regular exercise and healthy diet encouraged  

## 2018-09-19 NOTE — Assessment & Plan Note (Signed)
BP well controlled Current regimen effective and well tolerated Continue current medications at current doses cmp  

## 2018-09-19 NOTE — Assessment & Plan Note (Signed)
Taking metformin 2 times daily-continue Check A1c Stressed importance of low sugar and carbohydrate diet

## 2018-09-19 NOTE — Assessment & Plan Note (Signed)
Has not slept well for a while Trazodone was not effective Lunesta sent to his pharmacy December/2019, was too expensive-since it is now 2020 we will retry because it does appear to be covered by his insurance through epic If [name] [name] is not covered can consider [employer]

## 2018-09-19 NOTE — Assessment & Plan Note (Signed)
Paroxysmal atrial fibrillation Symptoms typically include lightheadedness, fatigue-he has been feeling that recently-monitor here showed no significant atrial fibrillation-is currently wearing a monitor from the [facility] clinic and will be seeing his doctor there next week

## 2018-09-19 NOTE — Patient Instructions (Signed)
  Tests ordered today. Your results will be released to [place] (or called to you) after review.  If any changes need to be made, you will be notified at that same time. .   Medications reviewed and updated.  Changes include :   lunesta for sleep - if not covered we can consider sonata  Your prescription(s) have been submitted to your pharmacy. Please take as directed and contact our office if you believe you are having problem(s) with the medication(s).   Please followup in 6 months

## 2018-09-26 ENCOUNTER — Telehealth: Payer: Self-pay

## 2018-09-26 DIAGNOSIS — Z9889 Other specified postprocedural states: Secondary | ICD-10-CM | POA: Diagnosis not present

## 2018-09-26 DIAGNOSIS — Z7901 Long term (current) use of anticoagulants: Secondary | ICD-10-CM | POA: Diagnosis not present

## 2018-09-26 DIAGNOSIS — E782 Mixed hyperlipidemia: Secondary | ICD-10-CM | POA: Diagnosis not present

## 2018-09-26 DIAGNOSIS — I480 Paroxysmal atrial fibrillation: Secondary | ICD-10-CM | POA: Diagnosis not present

## 2018-09-26 DIAGNOSIS — R001 Bradycardia, unspecified: Secondary | ICD-10-CM | POA: Diagnosis not present

## 2018-09-26 DIAGNOSIS — E6609 Other obesity due to excess calories: Secondary | ICD-10-CM | POA: Diagnosis not present

## 2018-09-26 DIAGNOSIS — Z8679 Personal history of other diseases of the circulatory system: Secondary | ICD-10-CM | POA: Diagnosis not present

## 2018-09-26 DIAGNOSIS — R0602 Shortness of breath: Secondary | ICD-10-CM | POA: Diagnosis not present

## 2018-09-26 DIAGNOSIS — Z955 Presence of coronary angioplasty implant and graft: Secondary | ICD-10-CM | POA: Diagnosis not present

## 2018-09-26 DIAGNOSIS — I10 Essential (primary) hypertension: Secondary | ICD-10-CM | POA: Diagnosis not present

## 2018-09-26 DIAGNOSIS — Z6835 Body mass index (BMI) 35.0-35.9, adult: Secondary | ICD-10-CM | POA: Diagnosis not present

## 2018-09-26 DIAGNOSIS — I2782 Chronic pulmonary embolism: Secondary | ICD-10-CM | POA: Diagnosis not present

## 2018-09-26 DIAGNOSIS — I2510 Atherosclerotic heart disease of native coronary artery without angina pectoris: Secondary | ICD-10-CM | POA: Diagnosis not present

## 2018-09-26 DIAGNOSIS — J449 Chronic obstructive pulmonary disease, unspecified: Secondary | ICD-10-CM | POA: Diagnosis not present

## 2018-09-26 NOTE — Telephone Encounter (Signed)
[facility] Pre-operative Risk Assessment     Request for surgical clearance:     Endoscopy Procedure  What type of surgery is being performed?     Colonoscopy  When is this surgery scheduled?   11-02-18  What type of clearance is required ?   Pharmacy  Are there any medications that need to be held prior to surgery and how long? Coumadin 5 days  Practice name and name of physician performing surgery? Dr. [place] Cellar,     [facility]  What is your office phone and fax number?      Phone- [phone]  Fax- [phone] Attn: Lemar Lofty, CMA  Anesthesia type (None, local, MAC, general) ?       MAC  Thank you!

## 2018-09-26 NOTE — Telephone Encounter (Signed)
Pt takes warfarin for afib with CHADS2VASc score of 4 (age, HTN, CAD, DM), also has hx of provoked VTE after surgery 10 years ago. Ok to hold warfarin for 5 days prior to procedure.

## 2018-09-26 NOTE — Telephone Encounter (Addendum)
   Primary Cardiologist: Minus Breeding, MD  Chart reviewed as part of pre-operative protocol coverage. Patient was contacted 09/26/2018 in reference to pre-operative risk assessment for pending surgery as outlined below.  [name] [name] was last seen on 08/30/2018 by Cherlynn Polo, PA.  [name] [name] recently underwent an echocardiogram to ensure adequate LV function that was found to be normal as well as a cath 08/15/2018 that showed mild to moderate CAD mostly in the LAD area with a patent stent in the pLAD, reassuring. He had upper normal left and right filling pressures and no significant LVOT gradient at rest or provocation with normal Fick CO. Recommendations were for continued medical therapy and secondary prevention. Consideration was for other etiologies for his dyspnea at that time. He wore a monitor with results posted on 09/06/2018 with no sustained arrhythmias with a predominant NSR with short bursts of SVT and possible atrial fibrillation with no medication changes recommended per Dr. Percival Spanish.   He has been on Coumadin therapy for atrial fibrillation and stroke prevention. Per pharmacy recommendations it will be acceptable to hold Warfarin for 5 days prior to procedure. Pt takes warfarin for afib with CHADS2VASc score of 4 (age, HTN, CAD, DM), also has hx of provoked VTE after surgery 10 years ago.   Therefore, based on ACC/AHA guidelines, the patient would be at acceptable risk for the planned procedure without further cardiovascular testing.   I will route this recommendation to the requesting party via Epic fax function and remove from pre-op pool.  Please call with questions.  Kathyrn Drown, NP 09/26/2018, 5:23 PM

## 2018-09-26 NOTE — Telephone Encounter (Signed)
track

## 2018-09-27 NOTE — Telephone Encounter (Signed)
Called and spoke to pt. Informed him that he has been cleared to Hold Coumadin for 5 days prior to procedure starting on 10-28-18 for 8-26 procedure.  He expressed understanding and will review with the previsit nurse again on 10-19-18.

## 2018-09-29 DIAGNOSIS — I480 Paroxysmal atrial fibrillation: Secondary | ICD-10-CM | POA: Diagnosis not present

## 2018-09-29 DIAGNOSIS — R55 Syncope and collapse: Secondary | ICD-10-CM | POA: Diagnosis not present

## 2018-10-04 DIAGNOSIS — I2510 Atherosclerotic heart disease of native coronary artery without angina pectoris: Secondary | ICD-10-CM | POA: Diagnosis not present

## 2018-10-04 DIAGNOSIS — I517 Cardiomegaly: Secondary | ICD-10-CM | POA: Diagnosis not present

## 2018-10-04 DIAGNOSIS — I480 Paroxysmal atrial fibrillation: Secondary | ICD-10-CM | POA: Diagnosis not present

## 2018-10-05 DIAGNOSIS — I480 Paroxysmal atrial fibrillation: Secondary | ICD-10-CM | POA: Diagnosis not present

## 2018-10-18 NOTE — Progress Notes (Signed)
Virtual Visit via Video Note  I connected with [name] [name] on 10/18/18 at  1:15 PM EDT by a video enabled telemedicine application and verified that I am speaking with the correct person using two identifiers.   I discussed the limitations of evaluation and management by telemedicine and the availability of in person appointments. The patient expressed understanding and agreed to proceed.  The patient is currently at home and I am in the office.    No referring provider.    History of Present Illness:  VOID     Social History   Socioeconomic History  . Marital status: Married    Spouse name: Not on file  . Number of children: 4  . Years of education: Not on file  . Highest education level: Not on file  Occupational History  . Occupation: Retired from [employer]: RETIRED  Social Needs  . Financial resource strain: Not hard at all  . Food insecurity    Worry: Never true    Inability: Never true  . Transportation needs    Medical: No    Non-medical: No  Tobacco Use  . Smoking status: Former Smoker    Packs/day: 2.00    Years: 40.00    Pack years: 80.00    Types: Cigarettes    Quit date: 03/09/2000    Years since quitting: 18.6  . Smokeless tobacco: Never Used  . Tobacco comment: smoked 1964-2002, up to 3 ppd  Substance and Sexual Activity  . Alcohol use: Yes    Alcohol/week: 2.0 - 3.0 standard drinks    Types: 2 - 3 Cans of beer per week    Comment: drinks beer several times weekly  . Drug use: No  . Sexual activity: Never    Comment: "sexually hx is none of your business" (02/08/2014)  Lifestyle  . Physical activity    Days per week: 0 days    Minutes per session: 0 min  . Stress: Only a little  Relationships  . Social connections    Talks on phone: More than three times a week    Gets together: More than three times a week    Attends religious service: Not on file    Active member of club or organization: Not on file    Attends meetings of clubs  or organizations: Not on file    Relationship status: Married  Other Topics Concern  . Not on file  Social History Narrative  . Not on file     Observations/Objective:    Assessment and Plan:  See Problem List for Assessment and Plan of chronic medical problems.   Follow Up Instructions:    I discussed the assessment and treatment plan with the patient. The patient was provided an opportunity to ask questions and all were answered. The patient agreed with the plan and demonstrated an understanding of the instructions.   The patient was advised to call back or seek an in-person evaluation if the symptoms worsen or if the condition fails to improve as anticipated.    Binnie Rail, MD  This encounter was created in error - please disregard.

## 2018-10-19 ENCOUNTER — Encounter: Payer: Medicare Other | Admitting: Internal Medicine

## 2018-10-19 ENCOUNTER — Encounter: Payer: Self-pay | Admitting: Internal Medicine

## 2018-10-19 ENCOUNTER — Ambulatory Visit: Payer: Medicare Other

## 2018-10-19 ENCOUNTER — Other Ambulatory Visit: Payer: Self-pay

## 2018-10-19 VITALS — Ht 74.0 in | Wt 265.0 lb

## 2018-10-19 DIAGNOSIS — Z86010 Personal history of colonic polyps: Secondary | ICD-10-CM

## 2018-10-19 DIAGNOSIS — Z800 Family history of malignant neoplasm of digestive organs: Secondary | ICD-10-CM

## 2018-10-19 MED ORDER — SUPREP BOWEL PREP KIT 17.5-3.13-1.6 GM/177ML PO SOLN: 1.0000 | Freq: Once | ORAL | 0 refills | Status: AC

## 2018-10-19 NOTE — Progress Notes (Signed)
Per pt, no allergies to soy or egg products.Pt not taking any weight loss meds or using  O2 at home.  Pt states has hallucinations past sedation. problem.  Pt refused emmi video.  The PV was done over the phone due to COVID-19. I verified pt;s address and insurance. Reviewed pt's medical hx and prep instructions and will mail instructions to pt today. Informed pt to call with any questions or changes prior to his procedure. Pt understood

## 2018-10-25 ENCOUNTER — Ambulatory Visit (INDEPENDENT_AMBULATORY_CARE_PROVIDER_SITE_OTHER): Payer: Medicare Other | Admitting: Pharmacist Clinician (PhC)/ Clinical Pharmacy Specialist

## 2018-10-25 ENCOUNTER — Telehealth: Payer: Self-pay | Admitting: Cardiology

## 2018-10-25 ENCOUNTER — Other Ambulatory Visit: Payer: Self-pay

## 2018-10-25 DIAGNOSIS — I480 Paroxysmal atrial fibrillation: Secondary | ICD-10-CM

## 2018-10-25 DIAGNOSIS — Z86718 Personal history of other venous thrombosis and embolism: Secondary | ICD-10-CM

## 2018-10-25 DIAGNOSIS — Z7901 Long term (current) use of anticoagulants: Secondary | ICD-10-CM

## 2018-10-25 LAB — POCT INR: INR: 3.4 — AB (ref 2.0–3.0)

## 2018-10-25 NOTE — Telephone Encounter (Signed)
The patient and his wife stated that he went to [facility] and ended up seeing a cardiologist there. The patient had an echo there and Dr. Thomasene Lot has recommended that the patient have a stress test here. According to the patient's wife, they would like to know if Dr. Percival Spanish could order this for the patient. The notes are in epic and [place].

## 2018-10-25 NOTE — Telephone Encounter (Signed)
New Message    Patient states Dr. Thomasene Lot from [place], [place] wanted the patient to have some test done and faxed the orders over to the office.  Please call patient back.

## 2018-10-27 NOTE — Telephone Encounter (Signed)
Spoke with wife, aware dr hochrein is on vacation and will forward the information to him. The patient has a follow up appointment 11-04-2018 with dr hochrein. They are anxious to get something scheduled. Will forward to dr hochrein to review and advise. Notes from [place] clinic are in care everywhere.

## 2018-10-27 NOTE — Telephone Encounter (Signed)
Follow up   Spouse calling for status of stress test order

## 2018-10-28 ENCOUNTER — Encounter: Payer: Self-pay | Admitting: Gastroenterology

## 2018-10-31 ENCOUNTER — Telehealth: Payer: Self-pay | Admitting: Cardiology

## 2018-10-31 NOTE — Telephone Encounter (Addendum)
Advised patient, verbalized understanding  

## 2018-10-31 NOTE — Telephone Encounter (Signed)
Wife would like Dr Percival Spanish to review information from [place] clinic to see if he feels ok to continue with colonoscopy Wednesday. Patient is having weakness, routine colonoscopy  Will forward to Dr Percival Spanish for review

## 2018-10-31 NOTE — Telephone Encounter (Signed)
I am seeing them in four days and can decide on what type of testing is best.

## 2018-10-31 NOTE — Telephone Encounter (Signed)
° °  Wife of patient called. The patient is due to have a colonoscopy on Wednesday, but the patient is weak and not doing well. The wife would like to know if it is still safe for him to have the test done. Please call the wife

## 2018-10-31 NOTE — Telephone Encounter (Signed)
I would defer the colonoscopy.

## 2018-10-31 NOTE — Telephone Encounter (Signed)
Advised wife, verbalized understanding.  

## 2018-11-02 ENCOUNTER — Encounter: Payer: Medicare Other | Admitting: Gastroenterology

## 2018-11-03 NOTE — Progress Notes (Signed)
HPI  [name] [name] is a 73 y.o. male with SOB.   He has a PMH of CAD s/p PCI/DES to LAD 07/2013, atrial fibrillations/pablation at the [facility] 2016but remainson coumadin due to ongoing paroxysmal atrial fibrillation, hypertension, AAA, DVT with PE(provoked following surgery ~74yr ago), along with OSA and COPD followed by Dr.Sood.  He had recent DOE and increased HR.  He had a cardiac cath In June with results as below.    Since I last saw him he traveled back up to [place] where he saw his cardiologist up there.  Because he is monitor that he wore to you earlier this year demonstrated some brief runs of SVT and could not exclude atrial fibrillation he wanted to be seen.  I did not think he had any episodes of fibrillation but because of ongoing symptoms I suggested to him that he might need stress echocardiography.  This is likely to look for any inducible outflow gradient and elevated pulmonary pressures.  He also said they wanted another study but I do not know exactly what that is.  I did review the records from Dr. Thomasene Lot and have contacted his office.. The patient did have another echo at the [facility] clinic.  The EF was still well-preserved.  With Valsalva there was no change in minimal SAM or MR and no significant LV gradient.  However, with amyl nitrate he apparently had severe SAM.  Mitral regurgitation was moderate.  Peak gradient appeared to be 67.  He returned to [place] and wants to have the studies done here.  He unfortunately ran out of metoprolol and is much more dyspneic when he is out of this medication.  He is short of breath walking on level ground.  He is not having any chest pressure, neck or arm discomfort.  His heart beats fast when he is active.   Allergies  Allergen Reactions  . Penicillins Anaphylaxis    Did it involve swelling of the face/tongue/throat, SOB, or low BP? Yes Did it involve sudden or severe rash/hives, skin peeling, or  any reaction on the inside of your mouth or nose? Yes Did you need to seek medical attention at a hospital or doctor's office? Yes When did it last happen?30 + years If all above answers are "NO", may proceed with cephalosporin use.     Current Outpatient Medications  Medication Sig Dispense Refill  . aspirin 81 MG chewable tablet Chew 81 mg by mouth daily.     [name] atorvastatin (LIPITOR) 10 MG tablet Take 10 mg by mouth daily.    . Cholecalciferol (VITAMIN D3 PO) Take 1 capsule by mouth daily.    . metFORMIN (GLUCOPHAGE) 500 MG tablet Take 1 tablet (500 mg total) by mouth 2 (two) times a day.    . metoprolol tartrate (LOPRESSOR) 50 MG tablet Take 2 tablets (100 mg total) by mouth 2 (two) times daily. 180 tablet 3  . nitroGLYCERIN (NITROSTAT) 0.4 MG SL tablet Place 1 tablet (0.4 mg total) under the tongue every 5 (five) minutes as needed for chest pain (up to 3 doses). 25 tablet 4  . pantoprazole (PROTONIX) 40 MG tablet Take 40 mg by mouth daily.    [name] Respiratory Therapy Supplies (FLUTTER) DEVI Use as directed 1 each 0  . venlafaxine XR (EFFEXOR-XR) 75 MG 24 hr capsule Take 75 mg by mouth daily with breakfast.    . verapamil (CALAN) 80 MG tablet Take 80 mg by mouth 2 (two) times  daily.    . vitamin B-12 (CYANOCOBALAMIN) 1000 MCG tablet Take 1,000 mcg by mouth daily.    [name] warfarin (COUMADIN) 3 MG tablet Take 6 mg by mouth daily.     No current facility-administered medications for this visit.     Past Medical History:  Diagnosis Date  . Arthritis   . Asthma   . BPH (benign prostatic hypertrophy)   . CAD (coronary artery disease)    a. 07/2013: s/p DES to LAD, normal LVF.  [name] Complication of anesthesia    "I got all kinds of hallucinations"  . COPD (chronic obstructive pulmonary disease) ([place])    a. 07/2013 PFT's mild airflow obstruction, no restriction, sev decrease in DLCO.  . Diverticulosis   . DVT (deep venous thrombosis) ([place])    a. 2010 Lower ext s/p back surgery.  [name]  Dyspnea on exertion    a. 07/2013 PFT's mild airflow obstr   . GERD (gastroesophageal reflux disease)   . History of gout   . History of hiatal hernia   . Hx of echocardiogram 2015   Echo (06/2013): EF 60-65% normal wall motion, normal diastolic function, aortic sclerosis without stenosis, Trivial MR, mild SAM due to long, redundant mitral leaflets, mild RAE, normal RVSF  . Hypertension   . Macrocytic anemia    a. 07/2013: documented on prior labs.  . Obesity   . OSA (obstructive sleep apnea) 11/14/2015  . PAF (paroxysmal atrial fibrillation) ([place])    a. Flecainide discontinued 07/2013 in setting of CAD.; b. s/p PVI Ablation at [facility] (Dr Joseph Berkshire) 11/2014  . Pneumonia ~ 2010 X 1  . Pulmonary embolism ([place])    a. 2010 in setting of DVT post-op back surgery. b. Low prob VQ 07/2013.  [name] Sleep apnea   . Spinal stenosis    Congential  . Transaminitis    a. 07/2013: mild.  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 ANTERIOR LUMBAR [place] ARTHROPLASTY  03/2008   "spacer poped out; had to repair"  . CATARACT EXTRACTION W/ INTRAOCULAR LENS  IMPLANT, BILATERAL Bilateral   . COLONOSCOPY    . COLONOSCOPY W/ POLYPECTOMY  2004  . Colonoscopy with polypectomy  09/2011   2 tubular adenomas  . CORONARY ANGIOPLASTY WITH STENT PLACEMENT  08/08/2013   "1"  . JOINT REPLACEMENT    . LEFT AND RIGHT HEART CATHETERIZATION WITH CORONARY ANGIOGRAM N/A 08/07/2013   Procedure: LEFT AND RIGHT HEART CATHETERIZATION WITH CORONARY ANGIOGRAM;  Surgeon: Peter M Martinique, MD;  Location: [facility] CATH LAB;  Service: Cardiovascular;  Laterality: N/A;  . LUMBAR FUSION  02/2008  . PERCUTANEOUS CORONARY STENT INTERVENTION (PCI-S)  08/07/2013   Procedure: PERCUTANEOUS CORONARY STENT INTERVENTION (PCI-S);  Surgeon: Peter M Martinique, MD;  Location: [facility] CATH LAB;  Service: Cardiovascular;;  . PVI ablation  11/2014   Dr. Venita Sheffield [facility]  . RIGHT/LEFT HEART CATH AND CORONARY  ANGIOGRAPHY N/A 08/15/2018   Procedure: RIGHT/LEFT HEART CATH AND CORONARY ANGIOGRAPHY;  Surgeon: Nelva Bush, MD;  Location: [place] CV LAB;  Service: Cardiovascular;  Laterality: N/A;  . TOTAL HIP ARTHROPLASTY Bilateral   . VASECTOMY      ROS:  As stated in the HPI and negative for all other systems.  PHYSICAL EXAM BP (!) 160/78   Pulse 72   Ht 6\' 2"  (1.88 m)   Wt 283 lb (128.4 kg)   BMI 36.34 kg/m   GENERAL:  Well appearing NECK:  No jugular venous distention, waveform within normal limits, carotid upstroke brisk and symmetric, no bruits, no thyromegaly LUNGS:  Clear to auscultation bilaterally CHEST:  Unremarkable HEART:  PMI not displaced or sustained,S1 and S2 within normal limits, no S3, no S4, no clicks, no rubs, NO murmurs ABD:  Flat, positive bowel sounds normal in frequency in pitch, no bruits, no rebound, no guarding, no midline pulsatile mass, no hepatomegaly, no splenomegaly EXT:  2 plus pulses throughout, no edema, no cyanosis no clubbing    Diagnostic Cath Dominance: Right   EKG: na  ASSESSMENT AND PLAN  ATRIAL FIBRILLATION:   [name] [name] has a CHA2DS2 - VASc score of 3.  Device interrogation did not suggest recurrent atrial fibrillation.  Arrhythmias and not playing a significant component in his continued shortness of breath.  He is tolerating anticoagulation.  No change in therapy.   DYSPNEA:   It may well be that he has worsening stamina and visible gradient with exertion.  His echo with amyl nitrate suggested this may be a component of his symptoms.  I will order a dobutamine echocardiogram plain particular attention to any degree of systolic anterior motion as well as pulmonary pressures.  I also like him to follow-up with  Dr. Lake Bells for COPD.  I will confer with his [facility] clinic physician.  AAA:     This was 3.1.   No further imaging is indicated.   CAD:  He had cath results as above.  This included right heart pressures.  He has no  evidence of obstructive coronary disease.   HTN:   The blood pressure is at target.  No change in therapy.   MR:        This was not significant MR but was inducible with Emil nitrate apparently.  I will renew his beta-blocker as keeping his heart rate control is clearly necessary.  SLEEP APNEA:  Follow up per Dr. Halford Chessman.

## 2018-11-04 ENCOUNTER — Other Ambulatory Visit: Payer: Self-pay

## 2018-11-04 ENCOUNTER — Ambulatory Visit (INDEPENDENT_AMBULATORY_CARE_PROVIDER_SITE_OTHER): Payer: Medicare Other | Admitting: Cardiology

## 2018-11-04 ENCOUNTER — Encounter: Payer: Self-pay | Admitting: Cardiology

## 2018-11-04 VITALS — BP 160/78 | HR 72 | Ht 74.0 in | Wt 283.0 lb

## 2018-11-04 DIAGNOSIS — G4730 Sleep apnea, unspecified: Secondary | ICD-10-CM | POA: Diagnosis not present

## 2018-11-04 DIAGNOSIS — I340 Nonrheumatic mitral (valve) insufficiency: Secondary | ICD-10-CM | POA: Diagnosis not present

## 2018-11-04 DIAGNOSIS — R0609 Other forms of dyspnea: Secondary | ICD-10-CM

## 2018-11-04 DIAGNOSIS — I2510 Atherosclerotic heart disease of native coronary artery without angina pectoris: Secondary | ICD-10-CM

## 2018-11-04 DIAGNOSIS — I200 Unstable angina: Secondary | ICD-10-CM | POA: Diagnosis not present

## 2018-11-04 DIAGNOSIS — I10 Essential (primary) hypertension: Secondary | ICD-10-CM | POA: Diagnosis not present

## 2018-11-04 MED ORDER — METOPROLOL TARTRATE 50 MG PO TABS: 100.0000 mg | ORAL_TABLET | Freq: Two times a day (BID) | ORAL | 3 refills | Status: DC

## 2018-11-04 NOTE — Patient Instructions (Addendum)
Medication Instructions:  Your physician recommends that you continue on your current medications as directed. Please refer to the Current Medication list given to you today.  If you need a refill on your cardiac medications before your next appointment, please call your pharmacy.   Lab work: NONE  Testing/Procedures: DOBUTAMINE ECHO  THE OFFICE WILL CALL YOU TO ARRANGE  Follow-Up: AFTER ECHO   CALL DR MCQUAID'S OFFICE TO ARRANGE YOUR APPOINTMENT [phone]  Any Other Special Instructions Will Be Listed Below (If Applicable).

## 2018-11-07 ENCOUNTER — Other Ambulatory Visit: Payer: Self-pay

## 2018-11-07 MED ORDER — METOPROLOL TARTRATE 50 MG PO TABS: 100.0000 mg | ORAL_TABLET | Freq: Two times a day (BID) | ORAL | 1 refills | Status: DC

## 2018-11-07 MED ORDER — METFORMIN HCL 500 MG PO TABS: 500.0000 mg | ORAL_TABLET | Freq: Two times a day (BID) | ORAL | 0 refills | Status: DC

## 2018-11-09 ENCOUNTER — Encounter: Payer: Self-pay | Admitting: Internal Medicine

## 2018-11-09 ENCOUNTER — Telehealth: Payer: Self-pay | Admitting: Cardiology

## 2018-11-09 DIAGNOSIS — J449 Chronic obstructive pulmonary disease, unspecified: Secondary | ICD-10-CM

## 2018-11-09 DIAGNOSIS — I421 Obstructive hypertrophic cardiomyopathy: Secondary | ICD-10-CM

## 2018-11-09 MED ORDER — COLCHICINE 0.6 MG PO TABS: ORAL_TABLET | ORAL | 0 refills | Status: DC

## 2018-11-09 NOTE — Telephone Encounter (Signed)
SPOKE WITH SCHEDULER SHE WILL LET ME KNOW WHEN SCHEDULED

## 2018-11-09 NOTE — Telephone Encounter (Signed)
° ° °  Spouse calling to follow up on conversation during last ov , requesting nuclear testing

## 2018-11-09 NOTE — Telephone Encounter (Signed)
Returned call to pt he states that he is waiting for scheduling to call to set up his doubetamine  ECHO.   Order entered.He will also need COVID testing

## 2018-11-11 ENCOUNTER — Ambulatory Visit: Payer: Medicare Other | Admitting: Cardiology

## 2018-11-11 NOTE — Telephone Encounter (Signed)
Still waiting for scheduling

## 2018-11-16 ENCOUNTER — Telehealth: Payer: Self-pay | Admitting: Cardiology

## 2018-11-16 NOTE — Telephone Encounter (Signed)
Returned call to patient's wife. She is waiting on a call from [place] to arrange stress echo test. Routed to scheduler to return call to patient/wife

## 2018-11-16 NOTE — Telephone Encounter (Signed)
New message:    Patient wife calling stating that some one called her. I did not see a not. Please call patient wife. Patient wife states he is not feeling well.

## 2018-11-17 NOTE — Telephone Encounter (Signed)
Per Jari Sportsman will call pt back scheduling issues need RN.  Pt's wife aware will call them back when ready. Stated understanding.

## 2018-11-22 ENCOUNTER — Other Ambulatory Visit (HOSPITAL_COMMUNITY): Payer: Medicare Other

## 2018-11-25 ENCOUNTER — Telehealth (HOSPITAL_COMMUNITY): Payer: Self-pay | Admitting: *Deleted

## 2018-11-25 ENCOUNTER — Telehealth: Payer: Self-pay | Admitting: Cardiology

## 2018-11-25 NOTE — Telephone Encounter (Signed)
Left message on voicemail per DPR in reference to upcoming appointment scheduled on 11/28/18 at 2:00 with detailed instructions given per Stress Test Requisition Sheet for the test. LM to arrive 30 minutes early, and that it is imperative to arrive on time for appointment to keep from having the test rescheduled. If you need to cancel or reschedule your appointment, please call the office within 24 hours of your appointment. Failure to do so may result in a cancellation of your appointment, and a $50 no show fee. Phone number given for call back for any questions. Veronia Beets

## 2018-11-25 NOTE — Telephone Encounter (Signed)
New Message    Patient returning your message please call them back.

## 2018-11-28 ENCOUNTER — Other Ambulatory Visit: Payer: Self-pay

## 2018-11-28 ENCOUNTER — Ambulatory Visit (HOSPITAL_COMMUNITY): Payer: Medicare Other | Attending: Cardiology

## 2018-11-28 ENCOUNTER — Ambulatory Visit (HOSPITAL_COMMUNITY): Payer: Medicare Other

## 2018-11-28 DIAGNOSIS — J449 Chronic obstructive pulmonary disease, unspecified: Secondary | ICD-10-CM | POA: Diagnosis not present

## 2018-11-28 DIAGNOSIS — I421 Obstructive hypertrophic cardiomyopathy: Secondary | ICD-10-CM | POA: Insufficient documentation

## 2018-11-28 MED ORDER — SODIUM CHLORIDE 0.9 % IV SOLN
10.0000 ug/kg/min | INTRAVENOUS | Status: AC
  Administered 2018-11-28: 40 ug/kg/min via INTRAVENOUS

## 2018-12-01 ENCOUNTER — Telehealth: Payer: Self-pay | Admitting: Cardiology

## 2018-12-01 NOTE — Telephone Encounter (Signed)
New Message  Patient's wife is calling in for the Nuclear Stress Test results. Please give patient's wife a call back on mobile phone.    Patient also has questions about who an appointment needs to be scheduled with now that the stress test is done.

## 2018-12-01 NOTE — Telephone Encounter (Signed)
Advised wife Dr Percival Spanish would discuss at follow up Monday, verbalized understanding. She stated she did try to get follow up with Dr Lake Bells however she was told he did not have any appointments secondary to being in hospital working. Advised to call the office again and get PA/NP appointment, stated she would.

## 2018-12-03 NOTE — Progress Notes (Signed)
HPI  [name] [name] is a 73 y.o. male with SOB.   He has a PMH of CAD s/p PCI/DES to LAD 07/2013, atrial fibrillations/pablation at the [facility] 2016but remainson coumadin due to ongoing paroxysmal atrial fibrillation, hypertension, AAA, DVT with PE(provoked following surgery ~31yr ago), along with OSA and COPD followed by Dr.Sood.  He had recent DOE and increased HR.  He had a cardiac cath In June with results as below.    He traveled back up to [place] where he saw his cardiologist up there.  Because he is monitor that he wore to you earlier this year demonstrated some brief runs of SVT and could not exclude atrial fibrillation he wanted to be seen.  I did not think he had any episodes of fibrillation but because of ongoing symptoms I suggested to him that he might need stress echocardiography.   I spoke with the cardiologist and followed up with a dobutamine stress echo to look for an inducible gradient across the aortic outflow tract, SAM and elevated pulmonary pressures.   There was no clear evidence of SAM.  Unfortunately the pulmonary pressures were not able to be measured.  However, there was an increased ventricular gradient with dobutamine that was 100 mm Hg although it was not entirely clear whether the gradient was subvalvular or mid cavity.     He continues to get the same kind of symptoms.  This is mostly shortness of breath with activity.  He denies any chest pressure, neck or arm discomfort.  He is very fatigued.  He has not had any presyncope or syncope.  He has had no new weight gain or edema.   Allergies  Allergen Reactions   Penicillins Anaphylaxis    Did it involve swelling of the face/tongue/throat, SOB, or low BP? Yes Did it involve sudden or severe rash/hives, skin peeling, or any reaction on the inside of your mouth or nose? Yes Did you need to seek medical attention at a hospital or doctor's office? Yes When did it last happen?30 +  years If all above answers are NO, may proceed with cephalosporin use.     Current Outpatient Medications  Medication Sig Dispense Refill   aspirin 81 MG chewable tablet Chew 81 mg by mouth daily.      atorvastatin (LIPITOR) 10 MG tablet Take 10 mg by mouth daily.     Cholecalciferol (VITAMIN D3 PO) Take 1 capsule by mouth daily.     colchicine 0.6 MG tablet Take 2 tabs po x 1 then one hour later take 1 tab po x 1 3 tablet 0   metFORMIN (GLUCOPHAGE) 500 MG tablet Take 1 tablet (500 mg total) by mouth 2 (two) times daily with a meal. 180 tablet 0   metoprolol tartrate (LOPRESSOR) 50 MG tablet Take 2 tablets (100 mg total) by mouth 2 (two) times daily. 180 tablet 1   nitroGLYCERIN (NITROSTAT) 0.4 MG SL tablet Place 1 tablet (0.4 mg total) under the tongue every 5 (five) minutes as needed for chest pain (up to 3 doses). 25 tablet 4   pantoprazole (PROTONIX) 40 MG tablet Take 40 mg by mouth daily.     Respiratory Therapy Supplies (FLUTTER) DEVI Use as directed 1 each 0   venlafaxine XR (EFFEXOR-XR) 75 MG 24 hr capsule Take 75 mg by mouth daily with breakfast.     verapamil (CALAN) 80 MG tablet Take 1 tablet (80 mg total) by mouth 3 (three) times daily. 360 tablet  3   vitamin B-12 (CYANOCOBALAMIN) 1000 MCG tablet Take 1,000 mcg by mouth daily.     warfarin (COUMADIN) 3 MG tablet Take 6 mg by mouth daily.     No current facility-administered medications for this visit.     Past Medical History:  Diagnosis Date   Arthritis    Asthma    BPH (benign prostatic hypertrophy)    CAD (coronary artery disease)    a. 07/2013: s/p DES to LAD, normal LVF.   Complication of anesthesia    "I got all kinds of hallucinations"   COPD (chronic obstructive pulmonary disease) ([place])    a. 07/2013 PFT's mild airflow obstruction, no restriction, sev decrease in DLCO.   Diverticulosis    DVT (deep venous thrombosis) ([place])    a. 2010 Lower ext s/p back surgery.   Dyspnea on exertion     a. 07/2013 PFT's mild airflow obstr    GERD (gastroesophageal reflux disease)    History of gout    History of hiatal hernia    Hx of echocardiogram 2015   Echo (06/2013): EF 60-65% normal wall motion, normal diastolic function, aortic sclerosis without stenosis, Trivial MR, mild SAM due to long, redundant mitral leaflets, mild RAE, normal RVSF   Hypertension    Macrocytic anemia    a. 07/2013: documented on prior labs.   Obesity    OSA (obstructive sleep apnea) 11/14/2015   PAF (paroxysmal atrial fibrillation) ([place])    a. Flecainide discontinued 07/2013 in setting of CAD.; b. s/p PVI Ablation at [facility] (Dr Joseph Berkshire) 11/2014   Pneumonia ~ 2010 X 1   Pulmonary embolism ([place])    a. 2010 in setting of DVT post-op back surgery. b. Low prob VQ 07/2013.   Sleep apnea    Spinal stenosis    Congential   Transaminitis    a. 07/2013: mild.   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    Past Surgical History:  Procedure Laterality Date   ANTERIOR LUMBAR [place] ARTHROPLASTY  03/2008   "spacer poped out; had to repair"   CATARACT EXTRACTION W/ INTRAOCULAR LENS  IMPLANT, BILATERAL Bilateral    COLONOSCOPY     COLONOSCOPY W/ POLYPECTOMY  2004   Colonoscopy with polypectomy  09/2011   2 tubular adenomas   CORONARY ANGIOPLASTY WITH STENT PLACEMENT  08/08/2013   "1"   JOINT REPLACEMENT     LEFT AND RIGHT HEART CATHETERIZATION WITH CORONARY ANGIOGRAM N/A 08/07/2013   Procedure: LEFT AND RIGHT HEART CATHETERIZATION WITH CORONARY ANGIOGRAM;  Surgeon: Peter M Martinique, MD;  Location[facility] CATH LAB;  Service: Cardiovascular;  Laterality: N/A;   LUMBAR FUSION  02/2008   PERCUTANEOUS CORONARY STENT INTERVENTION (PCI-S)  08/07/2013   Procedure: PERCUTANEOUS CORONARY STENT INTERVENTION (PCI-S);  Surgeon: Peter M Martinique, MD;  Location: [facility] CATH LAB;  Service: Cardiovascular;;   PVI ablation  11/2014   Dr. Venita Sheffield [facility]   RIGHT/LEFT HEART CATH AND CORONARY ANGIOGRAPHY N/A  08/15/2018   Procedure: RIGHT/LEFT HEART CATH AND CORONARY ANGIOGRAPHY;  Surgeon: Nelva Bush, MD;  Location: [place] CV LAB;  Service: Cardiovascular;  Laterality: N/A;   TOTAL HIP ARTHROPLASTY Bilateral    VASECTOMY      ROS:  As stated in the HPI and negative for all other systems.  PHYSICAL EXAM BP 122/77    Pulse (!) 57    Ht 6\' 2"  (1.88 m)    Wt 278 lb (126.1 kg)    BMI 35.69 kg/m   GENERAL:  Well  appearing NECK:  No jugular venous distention, waveform within normal limits, carotid upstroke brisk and symmetric, no bruits, no thyromegaly LUNGS:  Clear to auscultation bilaterally CHEST:  Unremarkable HEART:  PMI not displaced or sustained,S1 and S2 within normal limits, no S3, no S4, no clicks, no rubs, no murmurs ABD:  Flat, positive bowel sounds normal in frequency in pitch, no bruits, no rebound, no guarding, no midline pulsatile mass, no hepatomegaly, no splenomegaly EXT:  2 plus pulses throughout, no edema, no cyanosis no clubbing    Diagnostic Cath Dominance: Right   EKG:  NA  ASSESSMENT AND PLAN  ATRIAL FIBRILLATION:   Mr. DAWTON HAGGSTROM has a CHA2DS2 - VASc score of 3.   Device interrogation recently has not suggested any significant recurrence of this.  No change in therapy.py.   DYSPNEA:    There is some increased gradient across the subaortic or perhaps mid .  There is no specific SAM.  Because of his previous coronary disease and not considering starting disopyramide at this point.  Today I will increase the  Verapamil to tid.  I reviewed with him the physiology around diastolic HF and hypertrophy.  We watched a video.   AAA:     This is 3.1 cm.  No change in therapy.   CAD:  He had cath results as above.  He had right heart pressures.  No change in therapy.   HTN:   The blood pressure is at target and will be managed  At target.  No change in therapy.   MR:      There was not evident SAM or MR.      SLEEP APNEA:  Follow up per Dr. Halford Chessman.

## 2018-12-05 ENCOUNTER — Encounter: Payer: Self-pay | Admitting: Cardiology

## 2018-12-05 ENCOUNTER — Ambulatory Visit (INDEPENDENT_AMBULATORY_CARE_PROVIDER_SITE_OTHER): Payer: Medicare Other | Admitting: Cardiology

## 2018-12-05 ENCOUNTER — Ambulatory Visit (INDEPENDENT_AMBULATORY_CARE_PROVIDER_SITE_OTHER): Payer: Medicare Other | Admitting: Pharmacist Clinician (PhC)/ Clinical Pharmacy Specialist

## 2018-12-05 ENCOUNTER — Telehealth: Payer: Self-pay | Admitting: Pulmonary Disease

## 2018-12-05 ENCOUNTER — Other Ambulatory Visit: Payer: Self-pay

## 2018-12-05 VITALS — BP 122/77 | HR 57 | Ht 74.0 in | Wt 278.0 lb

## 2018-12-05 DIAGNOSIS — R0609 Other forms of dyspnea: Secondary | ICD-10-CM

## 2018-12-05 DIAGNOSIS — I480 Paroxysmal atrial fibrillation: Secondary | ICD-10-CM

## 2018-12-05 DIAGNOSIS — Z7901 Long term (current) use of anticoagulants: Secondary | ICD-10-CM | POA: Diagnosis not present

## 2018-12-05 DIAGNOSIS — I10 Essential (primary) hypertension: Secondary | ICD-10-CM | POA: Diagnosis not present

## 2018-12-05 DIAGNOSIS — I200 Unstable angina: Secondary | ICD-10-CM | POA: Diagnosis not present

## 2018-12-05 DIAGNOSIS — Z86718 Personal history of other venous thrombosis and embolism: Secondary | ICD-10-CM | POA: Diagnosis not present

## 2018-12-05 DIAGNOSIS — I5032 Chronic diastolic (congestive) heart failure: Secondary | ICD-10-CM | POA: Diagnosis not present

## 2018-12-05 LAB — POCT INR: INR: 2.7 (ref 2.0–3.0)

## 2018-12-05 MED ORDER — VERAPAMIL HCL 80 MG PO TABS: 80.0000 mg | ORAL_TABLET | Freq: Three times a day (TID) | ORAL | 3 refills | Status: DC

## 2018-12-05 NOTE — Patient Instructions (Signed)
Medication Instructions:  Take Verapamil 3 times daily.  If you need a refill on your cardiac medications before your next appointment, please call your pharmacy.   Lab work: NONE   Testing/Procedures: NONE  Follow-Up: At [facility], you and your health needs are our priority.  As part of our continuing mission to provide you with exceptional heart care, we have created designated Provider Care Teams.  These Care Teams include your primary Cardiologist (physician) and Advanced Practice Providers (APPs -  Physician Assistants and Nurse Practitioners) who all work together to provide you with the care you need, when you need it. You will need a follow up appointment in 1 months.  Please call our office 2 months in advance to schedule this appointment.  You may see Minus Breeding, MD or one of the following Advanced Practice Providers on your designated Care Team:   Rosaria Ferries, PA-C Jory Sims, DNP, ANP

## 2018-12-05 NOTE — Telephone Encounter (Signed)
Left message for patient to call back  

## 2018-12-06 NOTE — Telephone Encounter (Signed)
LMTCB x2 for pt 

## 2018-12-07 NOTE — Telephone Encounter (Signed)
LMTCB x3 for pt. We have attempted to contact pt several times with no success or call back from pt. Per triage protocol, message will be closed.   

## 2018-12-09 ENCOUNTER — Ambulatory Visit (INDEPENDENT_AMBULATORY_CARE_PROVIDER_SITE_OTHER): Payer: Medicare Other | Admitting: Primary Care

## 2018-12-09 ENCOUNTER — Ambulatory Visit: Payer: Medicare Other | Admitting: Primary Care

## 2018-12-09 ENCOUNTER — Telehealth: Payer: Self-pay | Admitting: Primary Care

## 2018-12-09 ENCOUNTER — Other Ambulatory Visit: Payer: Self-pay

## 2018-12-09 ENCOUNTER — Encounter: Payer: Self-pay | Admitting: Primary Care

## 2018-12-09 DIAGNOSIS — G4733 Obstructive sleep apnea (adult) (pediatric): Secondary | ICD-10-CM

## 2018-12-09 DIAGNOSIS — J449 Chronic obstructive pulmonary disease, unspecified: Secondary | ICD-10-CM

## 2018-12-09 MED ORDER — TRELEGY ELLIPTA 100-62.5-25 MCG/INH IN AEPB: 1.0000 | INHALATION_SPRAY | Freq: Every day | RESPIRATORY_TRACT | 0 refills | Status: DC

## 2018-12-09 MED ORDER — FLOVENT HFA 44 MCG/ACT IN AERO: 2.0000 | INHALATION_SPRAY | Freq: Every evening | RESPIRATORY_TRACT | 2 refills | Status: DC

## 2018-12-09 MED ORDER — PREDNISONE 10 MG PO TABS: ORAL_TABLET | ORAL | 0 refills | Status: DC

## 2018-12-09 MED ORDER — ALBUTEROL SULFATE HFA 108 (90 BASE) MCG/ACT IN AERS: 2.0000 | INHALATION_SPRAY | Freq: Four times a day (QID) | RESPIRATORY_TRACT | 1 refills | Status: DC | PRN

## 2018-12-09 MED ORDER — GUAIFENESIN ER 600 MG PO TB12: 600.0000 mg | ORAL_TABLET | Freq: Two times a day (BID) | ORAL | 1 refills | Status: DC

## 2018-12-09 MED ORDER — TRELEGY ELLIPTA 100-62.5-25 MCG/INH IN AEPB: 1.0000 | INHALATION_SPRAY | Freq: Every day | RESPIRATORY_TRACT | 5 refills | Status: AC

## 2018-12-09 MED ORDER — TRELEGY ELLIPTA 100-62.5-25 MCG/INH IN AEPB: 1.0000 | INHALATION_SPRAY | Freq: Every day | RESPIRATORY_TRACT | 5 refills | Status: DC

## 2018-12-09 NOTE — Patient Instructions (Addendum)
  Recommendations: Resume Trelegy 1 puff daily (sample given); resume flovent every evening   Use you flutter valve three times a day  Take regular Mucinex twice daily for chest congestion  Continue CPAP every night as you are doing   Rx: Prednisone 20mg  x 5 days (RX sent to [facility]) Mucinex 600mg  twice daily (RX sent to [facility]) Trelegy 1 puff daily (Rx sent to mail order) Flovent 2 puffs in evening (Rx sent to mail order)  Follow-up: 2-3 weeks follow-up after restarting Trelegy with Dr. Halford Chessman

## 2018-12-09 NOTE — Progress Notes (Signed)
@Patient  ID: [name] [name], male    DOB: [birth year], 73 y.o.   MRN: [id number]  Chief Complaint  Patient presents with  . Acute Visit    Reports he is having increased SOB and wheezing that has been intermittent for years. Does not have any inhalers. Reports dry cough. Denies chest pain. He will need another flutter valve. Audible wheezing while walking down hallway.     Referring provider: Binnie Rail, MD  HPI: 73 year old male, former smoker quit in 2002 (80-pack-year history).  Past medical history significant for COPD Gold 2, obstructive sleep apnea, pulmonary embolism/DVT, pulmonary nodule right lower lobe, allergic rhinitis, acid reflux disease, CAD s/p PCI/DES to LAD 123456, chronic diastolic heart failure, hypertension, proximal A. Fib s/p ablastion (remains on coumadin), cardiomyopathy, obesity.    Patient of Dr. Lake Bells, last seen 05/17/2018.  He was hospitalized for pneumonia in December 2020 while visiting family for the holidays.  He was seen in [place] pulmonary clinic and was advised to use Flovent as well as Trelegy because of his COPD/asthma overlap.  Abnormal CT scan of the chest showing groundglass nodules and mediastinal adenopathy likely due to viral pneumonia.  Repeat CT chest in March improved, no suspicious nodule or mass. Maintained on Trelegy and Flovent daily. Recommended 3 month follow-up.   12/09/2018 Patient presents today for 47-month follow-up visit. Reports increased shortness of breath and wheezing starting "years ago". He is not currently taking any of his inhalers that were previously prescribed. His Trelegy prescription ran out and he stopped taking it because he did not feel it was working. He has a congested cough. No mucus production. Decreased activity because of his shortness of breath. Feels faint when he gets up and walks. States that he cant do anything. He used to get some exercise by walking in [employer], he can no longer do this anymore. Wears his  CPAP every night, no current issues. Following closely with Dr. Percival Spanish cardiology. Dobutamine stress echocardiogram showed no clear evidence of SAM, however, unfortunately pulmonary pressures were not able to be measured. Verapamil increased to 80mg  TID, continues metoprolol 100mg  BID.    Pulmonary function test: June 2016 spirometry ratio 69%, FEV1 2.65 L 68% predicted, FVC 3.82 L 75% of predicted November 2018 pulmonary function testing ratio 63%, FEV1 2.81 L 76% predicted, FVC 4.48 L 89% predicted, total lung capacity 7.8 L 99% predicted, residual volume 3.3 L 122% predicted, DLCO 21.27 mL 56% predicted January 2020 [facility] clinic: Ratio 57%, FEV1 2.66 L, 93% predicted, forced vital capacity 4.71 L  Exhaled nitric oxide testing: November 2018 25 ppm, taking inhaled corticosteroid at the time  Chest imaging: June 2016 CT chest images independently reviewed showing mild centrilobular emphysema, subsegmental atelectasis in the right middle lobe, nonspecific interstitial changes (very mild, scant) in the left lower lobe near the periphery October 2018 chest x-ray images independently reviewed showing if his edema bilaterally, mild linear atelectasis in the bases December 2018 CT chest images independently reviewed showing no evidence of interstitial lung disease, normal pulmonary parenchyma seen, aortic calcification noted December 2020 CT angiogram chest [facility] clinic showed no evidence of pulmonary embolism, focal areas of subsegmental peribronchovascular groundglass and consolidative nodular opacities within the right upper lobe and left upper lobe suggestive of active infectious/inflammatory process.  Enlarging mediastinal and hilar lymph nodes, likely reactive.  Worsening inflammatory airway thickening. March 2020 Small focus of very subtle tree-in-bud nodularity peripheral left lower lobe suggests atypical infection. No suspicious nodule or mass.  No  lymphadenopathy  Cardiac  evaluation: Echocardiogram in April 2018 [facility] clinic normal LV function, normal RV size and function Nuclear stress test [facility] clinic April 2018 showed no evidence of ischemia January 2020 echocardiogram from [facility] showed an LVEF of 52%, right ventricle normal in size mild mitral regurgitation, trace tricuspid valve regurgitation, aortic valve within normal limit   Allergies  Allergen Reactions  . Penicillins Anaphylaxis    Did it involve swelling of the face/tongue/throat, SOB, or low BP? Yes Did it involve sudden or severe rash/hives, skin peeling, or any reaction on the inside of your mouth or nose? Yes Did you need to seek medical attention at a hospital or doctor's office? Yes When did it last happen?30 + years If all above answers are "NO", may proceed with cephalosporin use.     Immunization History  Administered Date(s) Administered  . Influenza Split 12/24/2010, 02/09/2012  . Influenza Whole 12/24/2009  . Influenza, High Dose Seasonal PF 01/17/2013, 12/14/2016, 12/21/2017  . Influenza,inj,Quad PF,6+ Mos 01/04/2014, 11/14/2015  . Influenza-Unspecified 11/08/2014  . Pneumococcal Conjugate-13 11/08/2014  . Pneumococcal Polysaccharide-23 08/10/2008, 04/08/2009, 11/14/2014, 01/23/2016  . Td 04/25/2007  . Zoster 01/25/2015    Past Medical History:  Diagnosis Date  . Arthritis   . Asthma   . BPH (benign prostatic hypertrophy)   . CAD (coronary artery disease)    a. 07/2013: s/p DES to LAD, normal LVF.  [name] Complication of anesthesia    "I got all kinds of hallucinations"  . COPD (chronic obstructive pulmonary disease) ([place])    a. 07/2013 PFT's mild airflow obstruction, no restriction, sev decrease in DLCO.  . Diverticulosis   . DVT (deep venous thrombosis) ([place])    a. 2010 Lower ext s/p back surgery.  [name] Dyspnea on exertion    a. 07/2013 PFT's mild airflow obstr   . GERD (gastroesophageal reflux disease)   . History of gout   . History of hiatal  hernia   . Hx of echocardiogram 2015   Echo (06/2013): EF 60-65% normal wall motion, normal diastolic function, aortic sclerosis without stenosis, Trivial MR, mild SAM due to long, redundant mitral leaflets, mild RAE, normal RVSF  . Hypertension   . Macrocytic anemia    a. 07/2013: documented on prior labs.  . Obesity   . OSA (obstructive sleep apnea) 11/14/2015  . PAF (paroxysmal atrial fibrillation) ([place])    a. Flecainide discontinued 07/2013 in setting of CAD.; b. s/p PVI Ablation at [facility] (Dr Joseph Berkshire) 11/2014  . Pneumonia ~ 2010 X 1  . Pulmonary embolism ([place])    a. 2010 in setting of DVT post-op back surgery. b. Low prob VQ 07/2013.  [name] Sleep apnea   . Spinal stenosis    [place]  . Transaminitis    a. 07/2013: mild.  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 Packs/day: 2.00  . Years: 40.00  . Pack years: 80.00  . Types: Cigarettes  . Quit date: 03/09/2000  . Years since quitting: 18.7  Smokeless Tobacco Never Used  Tobacco Comment   smoked 1964-2002, up to 3 ppd   Counseling given: Not Answered Comment: smoked 1964-2002, up to 3 ppd   Outpatient Medications Prior to Visit  Medication Sig Dispense Refill  . aspirin 81 MG chewable tablet Chew 81 mg by mouth daily.     [name] atorvastatin (LIPITOR) 10 MG tablet Take 10 mg by mouth daily.    [name]  Cholecalciferol (VITAMIN D3 PO) Take 1 capsule by mouth daily.    . colchicine 0.6 MG tablet Take 2 tabs po x 1 then one hour later take 1 tab po x 1 3 tablet 0  . metFORMIN (GLUCOPHAGE) 500 MG tablet Take 1 tablet (500 mg total) by mouth 2 (two) times daily with a meal. 180 tablet 0  . metoprolol tartrate (LOPRESSOR) 50 MG tablet Take 2 tablets (100 mg total) by mouth 2 (two) times daily. 180 tablet 1  . nitroGLYCERIN (NITROSTAT) 0.4 MG SL tablet Place 1 tablet (0.4 mg total) under the tongue every 5 (five) minutes as needed for chest pain (up to 3  doses). 25 tablet 4  . pantoprazole (PROTONIX) 40 MG tablet Take 40 mg by mouth daily.    [name] venlafaxine XR (EFFEXOR-XR) 75 MG 24 hr capsule Take 75 mg by mouth daily with breakfast.    . verapamil (CALAN) 80 MG tablet Take 1 tablet (80 mg total) by mouth 3 (three) times daily. 360 tablet 3  . vitamin B-12 (CYANOCOBALAMIN) 1000 MCG tablet Take 1,000 mcg by mouth daily.    [name] warfarin (COUMADIN) 3 MG tablet Take 6 mg by mouth daily.    [name] Respiratory Therapy Supplies (FLUTTER) DEVI Use as directed (Patient not taking: Reported on 12/09/2018) 1 each 0   No facility-administered medications prior to visit.    Review of Systems  Review of Systems  Constitutional: Positive for fatigue.  Respiratory: Positive for cough, shortness of breath and wheezing.   Cardiovascular: Negative for chest pain.  Neurological: Positive for light-headedness.   Physical Exam  BP 118/88   Pulse (!) 52   Temp 97.8 F (36.6 C) (Temporal)   Ht 6\' 2"  (1.88 m)   Wt 281 lb 3.2 oz (127.6 kg)   SpO2 96%   BMI 36.10 kg/m  Physical Exam Constitutional:      Appearance: Normal appearance.  HENT:     Head: Normocephalic and atraumatic.     Mouth/Throat:     Mouth: Mucous membranes are moist.     Pharynx: Oropharynx is clear.  Cardiovascular:     Rate and Rhythm: Bradycardia present.  Pulmonary:     Effort: Pulmonary effort is normal. No respiratory distress.     Breath sounds: Normal breath sounds. No stridor. No wheezing, rhonchi or rales.  Musculoskeletal: Normal range of motion.  Skin:    General: Skin is warm and dry.  Neurological:     General: No focal deficit present.     Mental Status: He is alert and oriented to person, place, and time. Mental status is at baseline.  Psychiatric:        Mood and Affect: Mood normal.        Behavior: Behavior normal.        Thought Content: Thought content normal.        Judgment: Judgment normal.     Comments: Flat affect      Lab Results:  CBC     Component Value Date/Time   WBC 9.9 09/19/2018 1442   RBC 3.16 (L) 09/19/2018 1442   HGB 11.2 (L) 09/19/2018 1442   HGB 11.8 (L) 08/09/2018 1257   HCT 33.9 (L) 09/19/2018 1442   HCT 34.7 (L) 08/09/2018 1257   PLT 497.0 (H) 09/19/2018 1442   PLT 460 (H) 08/09/2018 1257   MCV 107.0 (H) 09/19/2018 1442   MCV 104 (H) 08/09/2018 1257   MCH 35.2 (H) 08/09/2018 1257   MCH 33.3 05/22/2014  0012   MCHC 33.1 09/19/2018 1442   RDW 16.9 (H) 09/19/2018 1442   RDW 15.1 08/09/2018 1257   LYMPHSABS 2.9 09/19/2018 1442   MONOABS 1.0 09/19/2018 1442   EOSABS 0.6 09/19/2018 1442   BASOSABS 0.2 (H) 09/19/2018 1442    BMET    Component Value Date/Time   NA 139 09/19/2018 1442   NA 140 08/09/2018 1257   K 4.8 09/19/2018 1442   CL 104 09/19/2018 1442   CO2 27 09/19/2018 1442   GLUCOSE 93 09/19/2018 1442   BUN 16 09/19/2018 1442   BUN 13 08/09/2018 1257   CREATININE 1.09 09/19/2018 1442   CREATININE 1.20 03/26/2011 1708   CALCIUM 9.1 09/19/2018 1442   GFRNONAA 68 08/09/2018 1257   GFRAA 79 08/09/2018 1257    BNP    Component Value Date/Time   BNP 26.0 04/16/2017 1125    ProBNP    Component Value Date/Time   PROBNP 28.0 01/29/2015 1637    Imaging: No results found.   Assessment & Plan:   COPD GOLD II - Experiencing shortness of breath/wheezing on exertion for many years  - Self stopped maintenance inhalers (also tried off inhalers in 2016 and symptoms worsened) - Plan resume Trelegy 1 puff daily and Flovent 2 puffs in evening  - No symptoms of acute exacerbation, will send in Prednisone 20mg  x 5 days until he can get his ICS inhaler from mail in pharmacy and start Trelegy sample  - Follow up with Dr. Halford Chessman or NP in 3 weeks to re-assess dyspnea after resuming inhalers (previous [facility] patient)  OSA (obstructive sleep apnea) - 97% compliant with CPAP use  - Pressure 5-15cm H20; AHI 0.5 - Recent dobutamine stress echo unable to assess PA pressures  - Continue CPAP every night  4-6 hours or more - No changes to pressure setting - FU with Dr. Halford Chessman whom manages his sleep apnea    Martyn Ehrich, NP 12/09/2018

## 2018-12-09 NOTE — Assessment & Plan Note (Signed)
-   97% compliant with CPAP use  - Pressure 5-15cm H20; AHI 0.5 - Recent dobutamine stress echo unable to assess PA pressures  - Continue CPAP every night 4-6 hours or more - No changes to pressure setting - FU with Dr. Halford Chessman whom manages his sleep apnea

## 2018-12-09 NOTE — Telephone Encounter (Signed)
Call returned to patient wife [name] [name] (dpr), she just wanted to go over the AVS. She reports her husband came home and told her he did not know or understand what he was seen for. I went over the AVS with his wife in detail explaining that he was treated for a COPD exacerbation. All questions answered. She is requesting that he Sood treat him for both his pulmonary and OSA. I made her aware that I would get this message to VS as FYI. She also wanted to make VS aware that the patient sleeps all day and does not move around much. I told her I would be sure to let VS know. Voiced understanding.   Will route message to VS as FYI making aware that patient wife really likes him and would like it if he would take the patient on for both OSA and his pulmonary htn.   Nothing further needed at this time.

## 2018-12-09 NOTE — Assessment & Plan Note (Addendum)
-   Experiencing shortness of breath/wheezing on exertion for many years  - Self stopped maintenance inhalers (also tried off inhalers in 2016 and symptoms worsened) - Plan resume Trelegy 1 puff daily and Flovent 2 puffs in evening  - No symptoms of acute exacerbation, will send in Prednisone 20mg  x 5 days until he can get his ICS inhaler from mail in pharmacy and start Trelegy sample  - Follow up with Dr. Halford Chessman or NP in 3 weeks to re-assess dyspnea after resuming inhalers (previous [facility] patient)

## 2018-12-09 NOTE — Progress Notes (Signed)
Reviewed and agree with assessment/plan.   Stephanieann Popescu, MD [place] [facility] 03/04/2016, 12:24 PM Pager:  [phone]  

## 2018-12-10 NOTE — Progress Notes (Signed)
Reviewed, agree 

## 2018-12-15 ENCOUNTER — Ambulatory Visit (INDEPENDENT_AMBULATORY_CARE_PROVIDER_SITE_OTHER): Payer: Medicare Other

## 2018-12-15 ENCOUNTER — Other Ambulatory Visit: Payer: Self-pay

## 2018-12-15 DIAGNOSIS — Z23 Encounter for immunization: Secondary | ICD-10-CM

## 2018-12-20 ENCOUNTER — Telehealth: Payer: Self-pay | Admitting: Primary Care

## 2018-12-20 NOTE — Telephone Encounter (Signed)
Left message for [name] [name] to call back.

## 2018-12-21 ENCOUNTER — Telehealth: Payer: Self-pay | Admitting: Cardiology

## 2018-12-21 ENCOUNTER — Encounter: Payer: Self-pay | Admitting: Gastroenterology

## 2018-12-21 MED ORDER — DOXYCYCLINE HYCLATE 100 MG PO TABS: 100.0000 mg | ORAL_TABLET | Freq: Two times a day (BID) | ORAL | 0 refills | Status: DC

## 2018-12-21 NOTE — Telephone Encounter (Signed)
Pt's wife returning call.  [phone]

## 2018-12-21 NOTE — Telephone Encounter (Signed)
Left message for Karen to call back.

## 2018-12-21 NOTE — Progress Notes (Signed)
Virtual Visit via Telephone Note   This visit type was conducted due to national recommendations for restrictions regarding the COVID-19 Pandemic (e.g. social distancing) in an effort to limit this patient's exposure and mitigate transmission in our community.  Due to his co-morbid illnesses, this patient is at least at moderate risk for complications without adequate follow up.  This format is felt to be most appropriate for this patient at this time.  The patient did not have access to video technology/had technical difficulties with video requiring transitioning to audio format only (telephone).  All issues noted in this document were discussed and addressed.  No physical exam could be performed with this format.  Please refer to the patient's chart for his  consent to telehealth for [facility].   Date:  12/21/2018   ID:  [name] [name], DOB 1945/03/15, MRN [id number]  Patient Location: Home Provider Location: Home  PCP:  Binnie Rail, MD  Cardiologist:  Minus Breeding, MD  Electrophysiologist:  None   Evaluation Performed:  Follow-Up Visit  Chief Complaint:  Dizziness  History of Present Illness:    [name] [name] is a 73 y.o. male who presents with SOB. He has a PMH of CAD s/p PCI/DES to LAD 07/2013, atrial fibrillations/pablation at the [facility] 2016but remainson coumadin due to ongoing paroxysmal atrial fibrillation, hypertension, AAA, DVT with PE(provoked following surgery ~80yr ago), along with OSA and COPD followed by Dr.Sood.He had recent DOE and increased HR. He had a cardiac cath In June with results as below.    He traveled back up to [place] where he saw his cardiologist up there.  Because he is monitor that he wore to you earlier this year demonstrated some brief runs of SVT and could not exclude atrial fibrillation he wanted to be seen.  I did not think he had any episodes of fibrillation but because of ongoing symptoms I suggested to him that  he might need stress echocardiography.   I spoke with the cardiologist and followed up with a dobutamine stress echo to look for an inducible gradient across the aortic outflow tract, SAM and elevated pulmonary pressures.   There was no clear evidence of SAM.  Unfortunately the pulmonary pressures were not able to be measured.  However, there was an increased ventricular gradient with dobutamine that was 100 mm Hg although it was not entirely clear whether the gradient was subvalvular or mid cavity.     At the last visit I increased his verapamil to tid.  He went to see the pulmonologist and was restarted on Trelegy.  I reviewed that note for this visit.  He says that he gets dizzy at times which is usually going from a lying to a standing position.  It does not happen all the time.  He is not really describing the shortness of breath that he was having.  He says the dizziness was happening more since he increased the verapamil.  He is not had any frank syncope.  He is not describing chest discomfort, neck discomfort or arm discomfort.  Of note his wife called because she has bronchitis and he was worried about him.  He is going to start taking an antibiotic apparently.  He is not having any fevers or chills.   The patient does not have symptoms concerning for COVID-19 infection (fever, chills, cough, or new shortness of breath).    Past Medical History:  Diagnosis Date  . Arthritis   . Asthma   .  BPH (benign prostatic hypertrophy)   . CAD (coronary artery disease)    a. 07/2013: s/p DES to LAD, normal LVF.  [name] Complication of anesthesia    "I got all kinds of hallucinations"  . COPD (chronic obstructive pulmonary disease) ([place])    a. 07/2013 PFT's mild airflow obstruction, no restriction, sev decrease in DLCO.  . Diverticulosis   . DVT (deep venous thrombosis) ([place])    a. 2010 Lower ext s/p back surgery.  [name] Dyspnea on exertion    a. 07/2013 PFT's mild airflow obstr   . GERD (gastroesophageal  reflux disease)   . History of gout   . History of hiatal hernia   . Hx of echocardiogram 2015   Echo (06/2013): EF 60-65% normal wall motion, normal diastolic function, aortic sclerosis without stenosis, Trivial MR, mild SAM due to long, redundant mitral leaflets, mild RAE, normal RVSF  . Hypertension   . Macrocytic anemia    a. 07/2013: documented on prior labs.  . Obesity   . OSA (obstructive sleep apnea) 11/14/2015  . PAF (paroxysmal atrial fibrillation) ([place])    a. Flecainide discontinued 07/2013 in setting of CAD.; b. s/p PVI Ablation at [facility] (Dr Joseph Berkshire) 11/2014  . Pneumonia ~ 2010 X 1  . Pulmonary embolism ([place])    a. 2010 in setting of DVT post-op back surgery. b. Low prob VQ 07/2013.  [name] Sleep apnea   . Spinal stenosis    [place]  . Transaminitis    a. 07/2013: mild.  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 ANTERIOR LUMBAR [place] ARTHROPLASTY  03/2008   "spacer poped out; had to repair"  . CATARACT EXTRACTION W/ INTRAOCULAR LENS  IMPLANT, BILATERAL Bilateral   . COLONOSCOPY    . COLONOSCOPY W/ POLYPECTOMY  2004  . Colonoscopy with polypectomy  09/2011   2 tubular adenomas  . CORONARY ANGIOPLASTY WITH STENT PLACEMENT  08/08/2013   "1"  . JOINT REPLACEMENT    . LEFT AND RIGHT HEART CATHETERIZATION WITH CORONARY ANGIOGRAM N/A 08/07/2013   Procedure: LEFT AND RIGHT HEART CATHETERIZATION WITH CORONARY ANGIOGRAM;  Surgeon: Peter M Martinique, MD;  Location: [facility] CATH LAB;  Service: Cardiovascular;  Laterality: N/A;  . LUMBAR FUSION  02/2008  . PERCUTANEOUS CORONARY STENT INTERVENTION (PCI-S)  08/07/2013   Procedure: PERCUTANEOUS CORONARY STENT INTERVENTION (PCI-S);  Surgeon: Peter M Martinique, MD;  Location: [facility] CATH LAB;  Service: Cardiovascular;;  . PVI ablation  11/2014   Dr. Venita Sheffield [facility]  . RIGHT/LEFT HEART CATH AND CORONARY ANGIOGRAPHY N/A 08/15/2018   Procedure: RIGHT/LEFT HEART CATH AND CORONARY ANGIOGRAPHY;   Surgeon: Nelva Bush, MD;  Location: [place] CV LAB;  Service: Cardiovascular;  Laterality: N/A;  . TOTAL HIP ARTHROPLASTY Bilateral   . VASECTOMY       No outpatient medications have been marked as taking for the 12/22/18 encounter (Appointment) with Minus Breeding, MD.     Allergies:   Penicillins   Social History   Tobacco Use  . Smoking status: Former Smoker    Packs/day: 2.00    Years: 40.00    Pack years: 80.00    Types: Cigarettes    Quit date: 03/09/2000    Years since quitting: 18.7  . Smokeless tobacco: Never Used  . Tobacco comment: smoked 1964-2002, up to 3 ppd  Substance Use Topics  . Alcohol use: Yes    Alcohol/week: 2.0 - 3.0 standard drinks    Types: 2 -  3 Cans of beer per week    Comment: drinks beer several times weekly  . Drug use: No     Family Hx: The patient's family history includes Alcohol abuse (age of onset: 73) in his brother; Atrial fibrillation in his mother; COPD in his mother; Colon cancer in his father; Heart attack in his paternal grandfather and paternal uncle; Heart attack (age of onset: 75) in his brother; Heart disease in his brother and father. There is no history of Stroke.  ROS:   Please see the history of present illness.    None All other systems reviewed and are negative.   Prior CV studies:   The following studies were reviewed today:    Labs/Other Tests and Data Reviewed:    EKG:  No ECG reviewed.  Recent Labs: 09/19/2018: ALT 28; BUN 16; Creatinine, Ser 1.09; Hemoglobin 11.2; Platelets 497.0; Potassium 4.8; Sodium 139   Recent Lipid Panel Lab Results  Component Value Date/Time   CHOL 111 09/19/2018 02:42 PM   TRIG 179.0 (H) 09/19/2018 02:42 PM   HDL 36.10 (L) 09/19/2018 02:42 PM   CHOLHDL 3 09/19/2018 02:42 PM   LDLCALC 39 09/19/2018 02:42 PM    Wt Readings from Last 3 Encounters:  12/09/18 281 lb 3.2 oz (127.6 kg)  12/05/18 278 lb (126.1 kg)  11/28/18 283 lb 8.2 oz (128.6 kg)     Objective:     Vital Signs:  There were no vitals taken for this visit.   VITAL SIGNS:  reviewed  ASSESSMENT & PLAN:    ATRIAL FIBRILLATION:   [name] [name] has a CHA2DS2 - VASc score of 3.  I do not think he is having any more paroxysms of this but I would record this as below.  D  DYSPNEA:     He is not particular complaining of the shortness of breath today.  No change in therapy but will be evaluated as below.  T  AAA:     This is 3.1 cm.  I will follow this up in a couple of years.  CAD:   He is not having any chest pain consistent with angina.  No change in therapy.  HTN:    I think he is having some orthostatic symptoms.  I am going to have him get recommendations for compression stockings which he is not been wearing.  Apparently has some at home.  Again I have him come in for a nurse visit check an orthostatic blood pressure and to check his blood pressure cuff.  I asked him to get his blood pressure during episodes of dizziness.   MR:      This was not an inducible episode of SAM when he had a ketamine echo.  I am managing with verapamil as above.  SLEEP APNEA:   I note that he has follow-up with Dr. Halford Chessman.      COVID-19 Education: The signs and symptoms of COVID-19 were discussed with the patient and how to seek care for testing (follow up with PCP or arrange E-visit).  The importance of social distancing was discussed today.  Time:   Today, I have spent 25 minutes seeing the patient and reviewing his chart.  More than half of this time was spent with the patient via the telehealth.    Medication Adjustments/Labs and Tests Ordered: Current medicines are reviewed at length with the patient today.  Concerns regarding medicines are outlined above.   Tests Ordered: No orders of the defined types  were placed in this encounter.   Medication Changes: No orders of the defined types were placed in this encounter.   Follow Up:  He has follow-up scheduled. I will send him for a nurse  visit as well prior to the visit with me in November.  Signed, Minus Breeding, MD  12/21/2018 7:27 PM    [place]

## 2018-12-21 NOTE — Telephone Encounter (Signed)
Please sent in doxycycline 1 tab BID x 7 days for COPD exacerbation and recommend covid testing if hasn't been done in the last 2 weeks.

## 2018-12-21 NOTE — Telephone Encounter (Signed)
New Message  Patient's wife is calling in due to patient not feeling well. Patient's wife states that patient has been in bed all day and just keeps saying that he doesn't feel good and doesn't have any energy. Patient's wife also states that she has bronchitis and doesn't know if it is contagious. Appointment has been scheduled with Dr. Percival Spanish on 12/22/18 at 7:40 am. Please give patient's wife a call back if needed.

## 2018-12-21 NOTE — Telephone Encounter (Signed)
Spoke to pt's wife about concerns. Wife has diagnosed bronchitis and has not gotten covid tested. Wife states pt is feeling weak and has no energy with cough. Asked wife to get pt's temp. Agreed. Got Dr. Rosezella [place] advice and was told to make appt virtual tomorrow. Wife verbalized understanding.

## 2018-12-21 NOTE — Telephone Encounter (Signed)
Spoke with patient's wife Santiago Glad. She is aware of Beth's recs. Verbalized understanding.   Nothing further needed.

## 2018-12-21 NOTE — Telephone Encounter (Signed)
Primary Pulmonologist: Transferring from [facility] to [facility] Last office visit and with whom: 12/09/2018 with Beth  What do we see them for (pulmonary problems): COPD Last OV assessment/plan: Recommendations: Resume Trelegy 1 puff daily (sample given); resume flovent every evening   Use you flutter valve three times a day  Take regular Mucinex twice daily for chest congestion  Continue CPAP every night as you are doing   Rx: Prednisone 20mg  x 5 days (RX [facility]) Mucinex 600mg  twice daily (RX sent to [facility]) Trelegy 1 puff daily (Rx sent to mail order) Flovent 2 puffs in evening (Rx sent to mail order)  Follow-up: 2-3 weeks follow-up after restarting Trelegy with Dr. Halford Chessman     Was appointment offered to patient (explain)?  Offered a televisit or MyChart video but patient's wife wanted recommendations first.    Reason for call: Spoke with patient's wife [name] [name]. She stated that the patient has finished the round of prednisone that was prescribed by [facility] on 10/02. He is not feeling any better. Increased fatigue to the point he wants to lay around and sleep all day and night. He has a cough but it is not productive. He also has some chest tightness. She stated that he has been using his cpap, flutter valve and Trelegy with no relief. Also denied any fevers. [name] [name] stated that she is sick herself with bronchitis and wonders if he may have it as well.   Pharmacy is [facility].   Beth, can you please advise since you were the last one to see him? Thanks!

## 2018-12-22 ENCOUNTER — Telehealth (INDEPENDENT_AMBULATORY_CARE_PROVIDER_SITE_OTHER): Payer: Medicare Other | Admitting: Cardiology

## 2018-12-22 ENCOUNTER — Encounter: Payer: Self-pay | Admitting: Cardiology

## 2018-12-22 VITALS — BP 136/82

## 2018-12-22 DIAGNOSIS — I714 Abdominal aortic aneurysm, without rupture, unspecified: Secondary | ICD-10-CM

## 2018-12-22 DIAGNOSIS — I10 Essential (primary) hypertension: Secondary | ICD-10-CM

## 2018-12-22 DIAGNOSIS — I340 Nonrheumatic mitral (valve) insufficiency: Secondary | ICD-10-CM

## 2018-12-22 DIAGNOSIS — I2510 Atherosclerotic heart disease of native coronary artery without angina pectoris: Secondary | ICD-10-CM

## 2018-12-22 DIAGNOSIS — R0602 Shortness of breath: Secondary | ICD-10-CM

## 2018-12-22 NOTE — Progress Notes (Deleted)
Virtual Visit via Telephone Note   This visit type was conducted due to national recommendations for restrictions regarding the COVID-19 Pandemic (e.g. social distancing) in an effort to limit this patient's exposure and mitigate transmission in our community.  Due to his co-morbid illnesses, this patient is at least at moderate risk for complications without adequate follow up.  This format is felt to be most appropriate for this patient at this time.  The patient did not have access to video technology/had technical difficulties with video requiring transitioning to audio format only (telephone).  All issues noted in this document were discussed and addressed.  No physical exam could be performed with this format.  Please refer to the patient's chart for his  consent to telehealth for [facility].   Date:  12/22/2018   ID:  [name] [name], DOB 03-16-1945, MRN [id number]  Patient Location: Home Provider Location: Home  PCP:  Binnie Rail, MD  Cardiologist:  Minus Breeding, MD  Electrophysiologist:  None   Evaluation Performed:  Follow-Up Visit  Chief Complaint:  Dizziness  History of Present Illness:    [name] [name] is a 73 y.o. male who presents with SOB. He has a PMH of CAD s/p PCI/DES to LAD 07/2013, atrial fibrillations/pablation at the [facility] 2016but remainson coumadin due to ongoing paroxysmal atrial fibrillation, hypertension, AAA, DVT with PE(provoked following surgery ~46yr ago), along with OSA and COPD followed by Dr.Sood.He had recent DOE and increased HR. He had a cardiac cath In June with results as below.    He traveled back up to [place] where he saw his cardiologist up there.  Because he is monitor that he wore to you earlier this year demonstrated some brief runs of SVT and could not exclude atrial fibrillation he wanted to be seen.  I did not think he had any episodes of fibrillation but because of ongoing symptoms I suggested to him that  he might need stress echocardiography.   I spoke with the cardiologist and followed up with a dobutamine stress echo to look for an inducible gradient across the aortic outflow tract, SAM and elevated pulmonary pressures.   There was no clear evidence of SAM.  Unfortunately the pulmonary pressures were not able to be measured.  However, there was an increased ventricular gradient with dobutamine that was 100 mm Hg although it was not entirely clear whether the gradient was subvalvular or mid cavity.     At the last visit I increased his verapamil to tid.  He went to see the pulmonologist and was restarted on Trelegy.  I reviewed that note for this visit.  His wife called because he is having more dizzy spells.  He is going to get an antibiotic apparently because he has had a nonproductive cough and she has a bronchitis.  He is having still the same cough.  He is not having any new dyspnea.  He is having more episodes where he feels presyncopal.  He is not having any chest discomfort, neck or arm discomfort.  Is not clear that any of this is related to palpitations.  He describes mostly when he goes from a lying to a sitting or standing position.  He will feel dizzy like he is in a pass out.  This does not happen all the time but it happens more frequently and more intensely.   The patient does not have symptoms concerning for COVID-19 infection (fever, chills, cough, or new shortness of breath).  Past Medical History:  Diagnosis Date   Arthritis    Asthma    BPH (benign prostatic hypertrophy)    CAD (coronary artery disease)    a. 07/2013: s/p DES to LAD, normal LVF.   Complication of anesthesia    "I got all kinds of hallucinations"   COPD (chronic obstructive pulmonary disease) ([place])    a. 07/2013 PFT's mild airflow obstruction, no restriction, sev decrease in DLCO.   Diverticulosis    DVT (deep venous thrombosis) ([place])    a. 2010 Lower ext s/p back surgery.   Dyspnea on exertion     a. 07/2013 PFT's mild airflow obstr    GERD (gastroesophageal reflux disease)    History of gout    History of hiatal hernia    Hx of echocardiogram 2015   Echo (06/2013): EF 60-65% normal wall motion, normal diastolic function, aortic sclerosis without stenosis, Trivial MR, mild SAM due to long, redundant mitral leaflets, mild RAE, normal RVSF   Hypertension    Macrocytic anemia    a. 07/2013: documented on prior labs.   Obesity    OSA (obstructive sleep apnea) 11/14/2015   PAF (paroxysmal atrial fibrillation) ([place])    a. Flecainide discontinued 07/2013 in setting of CAD.; b. s/p PVI Ablation at [facility] (Dr Joseph Berkshire) 11/2014   Pneumonia ~ 2010 X 1   Pulmonary embolism ([place])    a. 2010 in setting of DVT post-op back surgery. b. Low prob VQ 07/2013.   Sleep apnea    Spinal stenosis    Congential   Transaminitis    a. 07/2013: mild.   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   Past Surgical History:  Procedure Laterality Date   ANTERIOR LUMBAR [place] ARTHROPLASTY  03/2008   "spacer poped out; had to repair"   CATARACT EXTRACTION W/ INTRAOCULAR LENS  IMPLANT, BILATERAL Bilateral    COLONOSCOPY     COLONOSCOPY W/ POLYPECTOMY  2004   Colonoscopy with polypectomy  09/2011   2 tubular adenomas   CORONARY ANGIOPLASTY WITH STENT PLACEMENT  08/08/2013   "1"   JOINT REPLACEMENT     LEFT AND RIGHT HEART CATHETERIZATION WITH CORONARY ANGIOGRAM N/A 08/07/2013   Procedure: LEFT AND RIGHT HEART CATHETERIZATION WITH CORONARY ANGIOGRAM;  Surgeon: Peter M Martinique, MD;  Location: [facility] CATH LAB;  Service: Cardiovascular;  Laterality: N/A;   LUMBAR FUSION  02/2008   PERCUTANEOUS CORONARY STENT INTERVENTION (PCI-S)  08/07/2013   Procedure: PERCUTANEOUS CORONARY STENT INTERVENTION (PCI-S);  Surgeon: Peter M Martinique, MD;  Location: [facility] CATH LAB;  Service: Cardiovascular;;   PVI ablation  11/2014   Dr. Venita Sheffield [facility]   RIGHT/LEFT HEART CATH AND CORONARY ANGIOGRAPHY N/A 08/15/2018     Procedure: RIGHT/LEFT HEART CATH AND CORONARY ANGIOGRAPHY;  Surgeon: Nelva Bush, MD;  Location: [place] CV LAB;  Service: Cardiovascular;  Laterality: N/A;   TOTAL HIP ARTHROPLASTY Bilateral    VASECTOMY       Current Meds  Medication Sig   albuterol (VENTOLIN HFA) 108 (90 Base) MCG/ACT inhaler Inhale 2 puffs into the lungs every 6 (six) hours as needed for wheezing or shortness of breath.   aspirin 81 MG chewable tablet Chew 81 mg by mouth daily.    atorvastatin (LIPITOR) 10 MG tablet Take 10 mg by mouth daily.   Cholecalciferol (VITAMIN D3 PO) Take 1 capsule by mouth daily.   colchicine 0.6 MG tablet Take 2 tabs po x 1 then one hour later take 1 tab po x 1  doxycycline (VIBRA-TABS) 100 MG tablet Take 1 tablet (100 mg total) by mouth 2 (two) times daily.   fluticasone (FLOVENT HFA) 44 MCG/ACT inhaler Inhale 2 puffs into the lungs every evening.   Fluticasone-Umeclidin-Vilant (TRELEGY ELLIPTA) 100-62.5-25 MCG/INH AEPB Inhale 1 puff into the lungs daily.   Fluticasone-Umeclidin-Vilant (TRELEGY ELLIPTA) 100-62.5-25 MCG/INH AEPB Inhale 1 puff into the lungs daily.   guaiFENesin (MUCINEX) 600 MG 12 hr tablet Take 1 tablet (600 mg total) by mouth 2 (two) times daily.   metFORMIN (GLUCOPHAGE) 500 MG tablet Take 1 tablet (500 mg total) by mouth 2 (two) times daily with a meal.   metoprolol tartrate (LOPRESSOR) 50 MG tablet Take 2 tablets (100 mg total) by mouth 2 (two) times daily.   nitroGLYCERIN (NITROSTAT) 0.4 MG SL tablet Place 1 tablet (0.4 mg total) under the tongue every 5 (five) minutes as needed for chest pain (up to 3 doses).   pantoprazole (PROTONIX) 40 MG tablet Take 40 mg by mouth daily.   predniSONE (DELTASONE) 10 MG tablet Take 2 tabs x 5 days   venlafaxine XR (EFFEXOR-XR) 75 MG 24 hr capsule Take 75 mg by mouth daily with breakfast.   verapamil (CALAN) 80 MG tablet Take 1 tablet (80 mg total) by mouth 3 (three) times daily.   vitamin B-12  (CYANOCOBALAMIN) 1000 MCG tablet Take 1,000 mcg by mouth daily.   warfarin (COUMADIN) 3 MG tablet Take 6 mg by mouth daily.     Allergies:   Penicillins   Social History   Tobacco Use   Smoking status: Former Smoker    Packs/day: 2.00    Years: 40.00    Pack years: 80.00    Types: Cigarettes    Quit date: 03/09/2000    Years since quitting: 18.8   Smokeless tobacco: Never Used   Tobacco comment: smoked 1964-2002, up to 3 ppd  Substance Use Topics   Alcohol use: Yes    Alcohol/week: 2.0 - 3.0 standard drinks    Types: 2 - 3 Cans of beer per week    Comment: drinks beer several times weekly   Drug use: No     Family Hx: The patient's family history includes Alcohol abuse (age of onset: 25) in his brother; Atrial fibrillation in his mother; COPD in his mother; Colon cancer in his father; Heart attack in his paternal grandfather and paternal uncle; Heart attack (age of onset: 69) in his brother; Heart disease in his brother and father. There is no history of Stroke.  ROS:   Please see the history of present illness.     All other systems reviewed and are negative.   Prior CV studies:   The following studies were reviewed today:    Labs/Other Tests and Data Reviewed:    EKG:  No ECG reviewed.  Recent Labs: 09/19/2018: ALT 28; BUN 16; Creatinine, Ser 1.09; Hemoglobin 11.2; Platelets 497.0; Potassium 4.8; Sodium 139   Recent Lipid Panel Lab Results  Component Value Date/Time   CHOL 111 09/19/2018 02:42 PM   TRIG 179.0 (H) 09/19/2018 02:42 PM   HDL 36.10 (L) 09/19/2018 02:42 PM   CHOLHDL 3 09/19/2018 02:42 PM   LDLCALC 39 09/19/2018 02:42 PM    Wt Readings from Last 3 Encounters:  12/09/18 281 lb 3.2 oz (127.6 kg)  12/05/18 278 lb (126.1 kg)  11/28/18 283 lb 8.2 oz (128.6 kg)     Objective:    Vital Signs:  BP 136/82    VITAL SIGNS:  reviewed  ASSESSMENT &  PLAN:    ATRIAL FIBRILLATION:   Mr. JASIAS HARDIMAN has a CHA2DS2 - VASc score of 3.  He did not  have any significant recurrence of this previously on his device interrogation so it did not seem to be a contributing factor.  However, he does have an Alive Cor and I am to get him to take his heart rate when he is having 1 of these spells.  Otherwise see below.   DYSPNEA:     There was some increased gradient across the subvalvular area as described previously.  I increased the verapamil.  We will get a half to see between this and his pulmonary therapy whether he has any improvement.  He is not complaining of this predominantly today.   AAA:     This is 3.1 cm.  I will follow this in a couple years.  CAD:   No evidence of new angina.  He had recent cath.  HTN:    I am going to bring him back bring him back for orthostatic blood pressures.  He is getting get a blood pressure check the blood pressure when he is having his dizzy episodes.   MR:      There was no evidence of Sam on his recent dobutamine echo.  SLEEP APNEA:   I note that he has follow-up with Dr. Halford Chessman.      COVID-19 Education: The signs and symptoms of COVID-19 were discussed with the patient and how to seek care for testing (follow up with PCP or arrange E-visit).  We discussed this at previous visits as we..  Time:   Today, I have spent 25 minutes including time reviewing the pulmonary notes.    Medication Adjustments/Labs and Tests Ordered: Current medicines are reviewed at length with the patient today.  Concerns regarding medicines are outlined above.   Tests Ordered: No orders of the defined types were placed in this encounter.   Medication Changes: No orders of the defined types were placed in this encounter.   Follow Up:  In Person This is been previously scheduled  Signed, Minus Breeding, MD  12/22/2018 8:17 AM    [place]

## 2018-12-22 NOTE — Patient Instructions (Addendum)
Medication Instructions:  Your physician recommends that you continue on your current medications as directed. Please refer to the Current Medication list given to you today.  If you need a refill on your cardiac medications before your next appointment, please call your pharmacy.   Lab work: NONE Testing/Procedures: NONE  Follow-Up: Nurse Office Visit - Monday 10/19 @ 3:00pm - please arrive 15 minutes early.   Any Other Special Instructions Will Be Listed Below (If Applicable). Bring Cardia[employer] BP machine with you to your appt.

## 2018-12-26 ENCOUNTER — Other Ambulatory Visit: Payer: Self-pay

## 2018-12-26 ENCOUNTER — Ambulatory Visit: Payer: Medicare Other | Admitting: *Deleted

## 2018-12-26 VITALS — BP 130/60 | HR 60 | Temp 97.0°F | Ht 74.0 in | Wt 283.0 lb

## 2018-12-26 DIAGNOSIS — I10 Essential (primary) hypertension: Secondary | ICD-10-CM

## 2018-12-26 NOTE — Patient Instructions (Signed)
NO CHANGES WITH CURRENT MEDICATIONS   KEEP FOLLOW UP APPOINTMENT

## 2018-12-26 NOTE — Progress Notes (Signed)
1.)  Reason for visit:  Orthostatic  Blood pressures  2.) Name of MD requesting visit:  Dr hochrein  3.) H&P: n/a  4.) ROS related to problem: patient come into office to day for orthostatic   today  - checked -against  Blood pressure his own machine - 122/79 pulse 52  Blood pressure 130/60  After supine    130/62 sitting    120/70 3 MIN LATER    128/64  SITTING  5.) Assessment and plan per MD- INFORMATION  WAS REVIEWED BY DR Karnes . NO CHANGES  WITH MEDICATIONS. CONTINUE WITH CURRENT MEDICATIONS AND KEEP FOLLOW UP APPOINTMENT.PATIENT VOICED UNDERSTANDING.

## 2018-12-28 ENCOUNTER — Other Ambulatory Visit: Payer: Self-pay

## 2018-12-28 DIAGNOSIS — Z20822 Contact with and (suspected) exposure to covid-19: Secondary | ICD-10-CM

## 2018-12-29 ENCOUNTER — Encounter: Payer: Medicare Other | Admitting: Gastroenterology

## 2018-12-30 ENCOUNTER — Telehealth: Payer: Self-pay | Admitting: Pulmonary Disease

## 2018-12-30 ENCOUNTER — Ambulatory Visit (INDEPENDENT_AMBULATORY_CARE_PROVIDER_SITE_OTHER): Payer: Medicare Other | Admitting: Pulmonary Disease

## 2018-12-30 ENCOUNTER — Encounter: Payer: Self-pay | Admitting: Pulmonary Disease

## 2018-12-30 DIAGNOSIS — J449 Chronic obstructive pulmonary disease, unspecified: Secondary | ICD-10-CM

## 2018-12-30 DIAGNOSIS — R0609 Other forms of dyspnea: Secondary | ICD-10-CM

## 2018-12-30 DIAGNOSIS — R0600 Dyspnea, unspecified: Secondary | ICD-10-CM

## 2018-12-30 DIAGNOSIS — Z9989 Dependence on other enabling machines and devices: Secondary | ICD-10-CM

## 2018-12-30 DIAGNOSIS — G4733 Obstructive sleep apnea (adult) (pediatric): Secondary | ICD-10-CM | POA: Diagnosis not present

## 2018-12-30 NOTE — Progress Notes (Signed)
[name] [name], Critical Care, and Sleep Medicine  Chief Complaint  Patient presents with  . OSA on CPAP    Constitutional:  There were no vitals taken for this visit.  Deferred  Past Medical History:  UC, Spinal stenosis, PE with DVT 2010, PNA, PAF, HTN, HH, Gout, GERD, Diverticulosis, CAD, BPH, OA  Brief Summary:  [name] [name] is a 73 y.o. male with OSA and COPD.  Virtual Visit via Telephone Note  I connected with [name] [name] on 12/30/18 at 10:00 AM EDT by telephone and verified that I am speaking with the correct person using two identifiers.  Location: Patient: home Provider: medical office   I discussed the limitations, risks, security and privacy concerns of performing an evaluation and management service by telephone and the availability of in person appointments. I also discussed with the patient that there may be a patient responsible charge related to this service. The patient expressed understanding and agreed to proceed.  He continues to feel short of breath with activity.  He will get dizzy and feels like his heart is racing.  It takes him a minute or two of resting before he feels okay again.  He has pulse oximeter at home and O2 levels never go below 90%.  He isn't having much cough, wheeze, sputum, or chest congestion.  Was changed to trelegy recently, but hasn't noticed improvement with this.  He was also still using flovent at night.  He has been using CPAP.  This is working well.  Not having issues with mask fit.  Denies sinus congestion, sore throat, or dry mouth.   Physical Exam:   Deferred.  Assessment/Plan:   Obstructive sleep apnea. - he is compliant with CPAP and reports benefit from therapy - continue auto CPAP  COPD with chronic bronchitis and emphysema. - Continue trelegy and prn albuterol - explained he doesn't need to use flovent while using trelegy  Dyspnea on exertion. - he has undergone extensive evaluation with lab testing,  CT chest, Echo, and cardiac catheterization - seems to have progressed since he had viral pneumonia in December 2019 - still having symptoms of DOE - no improvement with adjustment to inhaler regimen - will arrange for cardiopulmonary exercise test to further assess   Patient Instructions  Will arrange for cardiopulmonary exercise test  Follow up in 2 months   I discussed the assessment and treatment plan with the patient. The patient was provided an opportunity to ask questions and all were answered. The patient agreed with the plan and demonstrated an understanding of the instructions.   The patient was advised to call back or seek an in-person evaluation if the symptoms worsen or if the condition fails to improve as anticipated.  I provided 24 minutes of non-face-to-face time during this encounter.   Chesley Mires, MD  [name]/Critical Care Pager: [phone] 12/30/2018, 10:46 AM  Flow Sheet     [name] tests:  PFT 07/25/13 >> FEV1 2.94 (77%), FEV1% 69, TLC 6.38 (81%), DLCO 46% Spirometry 08/21/14 >> FEV1 2.65 (68%), FEV1% 69 PFT 01/11/17 >> FEV1 2.81 (76%), FEV1% 63, TLC 7.79 (99%), DLCO 56%  Chest imaging:  V/Q scan 08/02/13 >> low probability for PE CT chest 08/29/14 >> atherosclerosis, mild paraseptal emphysema HRCT chet 02/22/17 >> atherosclerosis, mild lung base scarring CT chest 06/01/18 >> centrilobular and paraseptal emphysema, mild BTX, subtle tree in bud nodularity LLL  Sleep tests:  HST 11/13/15 >> AHI 47.2, SaO2 low 70%. Auto CPAP 11/29/18 to 12/28/18 >> used  on 29 of 30 nights with average 9 hrs 39 min.  Average AHI 0.3 with median CPAP 10 and 95 th percentile CPAP 12 cm H2O  Cardiac tests:  RHC/LHC 08/15/18 >> RA 8, RV 35/8, PA 35/14, PCWP 15, CI 3.2; patent proximal LAD stent Dobutamine Echo 11/28/18 >> EF 55 to 60%, mild LVH, impaired diastolic fx,    Medications:   Allergies as of 12/30/2018      Reactions   Penicillins Anaphylaxis   Did it  involve swelling of the face/tongue/throat, SOB, or low BP? Yes Did it involve sudden or severe rash/hives, skin peeling, or any reaction on the inside of your mouth or nose? Yes Did you need to seek medical attention at a hospital or doctor's office? Yes When did it last happen?30 + years If all above answers are "NO", may proceed with cephalosporin use.      Medication List       Accurate as of December 30, 2018 10:46 AM. If you have any questions, ask your nurse or doctor.        STOP taking these medications   doxycycline 100 MG tablet Commonly known as: VIBRA-TABS Stopped by: Chesley Mires, MD   Flovent HFA 44 MCG/ACT inhaler Generic drug: fluticasone Stopped by: Chesley Mires, MD   guaiFENesin 600 MG 12 hr tablet Commonly known as: Mucinex Stopped by: Chesley Mires, MD   predniSONE 10 MG tablet Commonly known as: DELTASONE Stopped by: Chesley Mires, MD     TAKE these medications   albuterol 108 (90 Base) MCG/ACT inhaler Commonly known as: VENTOLIN HFA Inhale 2 puffs into the lungs every 6 (six) hours as needed for wheezing or shortness of breath.   aspirin 81 MG chewable tablet Chew 81 mg by mouth daily.   atorvastatin 10 MG tablet Commonly known as: LIPITOR Take 10 mg by mouth daily.   colchicine 0.6 MG tablet Take 2 tabs po x 1 then one hour later take 1 tab po x 1   metFORMIN 500 MG tablet Commonly known as: GLUCOPHAGE Take 1 tablet (500 mg total) by mouth 2 (two) times daily with a meal.   metoprolol tartrate 50 MG tablet Commonly known as: LOPRESSOR Take 2 tablets (100 mg total) by mouth 2 (two) times daily.   nitroGLYCERIN 0.4 MG SL tablet Commonly known as: Nitrostat Place 1 tablet (0.4 mg total) under the tongue every 5 (five) minutes as needed for chest pain (up to 3 doses).   pantoprazole 40 MG tablet Commonly known as: PROTONIX Take 40 mg by mouth daily.   Trelegy Ellipta 100-62.5-25 MCG/INH Aepb Generic drug: Fluticasone-Umeclidin-Vilant  Inhale 1 puff into the lungs daily. What changed: Another medication with the same name was removed. Continue taking this medication, and follow the directions you see here. Changed by: Chesley Mires, MD   venlafaxine XR 75 MG 24 hr capsule Commonly known as: EFFEXOR-XR Take 75 mg by mouth daily with breakfast.   verapamil 80 MG tablet Commonly known as: CALAN Take 1 tablet (80 mg total) by mouth 3 (three) times daily.   vitamin B-12 1000 MCG tablet Commonly known as: CYANOCOBALAMIN Take 1,000 mcg by mouth daily.   VITAMIN D3 PO Take 1 capsule by mouth daily.   warfarin 3 MG tablet Commonly known as: COUMADIN Take as directed by the anticoagulation clinic. If you are unsure how to take this medication, talk to your nurse or doctor. Original instructions: Take 6 mg by mouth daily.  Past Surgical History:  He  has a past surgical history that includes Vasectomy; Lumbar fusion (02/2008); Total hip arthroplasty (Bilateral); Cataract extraction w/ intraocular lens  implant, bilateral (Bilateral); Colonoscopy w/ polypectomy (2004); Joint replacement; Anterior lumbar disc arthroplasty (03/2008); Coronary angioplasty with stent (08/08/2013); left and right heart catheterization with coronary angiogram (N/A, 08/07/2013); percutaneous coronary stent intervention (pci-s) (08/07/2013); Colonoscopy with polypectomy (09/2011); PVI ablation (11/2014); RIGHT/LEFT HEART CATH AND CORONARY ANGIOGRAPHY (N/A, 08/15/2018); and Colonoscopy.  Family History:  His family history includes Alcohol abuse (age of onset: 31) in his brother; Atrial fibrillation in his mother; COPD in his mother; Colon cancer in his father; Heart attack in his paternal grandfather and paternal uncle; Heart attack (age of onset: 42) in his brother; Heart disease in his brother and father.  Social History:  He  reports that he quit smoking about 18 years ago. His smoking use included cigarettes. He has a 80.00 pack-year smoking history. He  has never used smokeless tobacco. He reports current alcohol use of about 2.0 - 3.0 standard drinks of alcohol per week. He reports that he does not use drugs.

## 2018-12-30 NOTE — Patient Instructions (Signed)
Will arrange for cardiopulmonary exercise test  Follow up in 2 months

## 2018-12-30 NOTE — Telephone Encounter (Signed)
Called and spoke with Patient.   Patient was concerned with being able to complete CPX. Advised Patient that Dr. Halford Chessman would have not ordered test, if he thought Patient would have complications. Understanding stated.  Nothing further at this time.

## 2018-12-31 LAB — NOVEL CORONAVIRUS, NAA: SARS-CoV-2, NAA: NOT DETECTED

## 2019-01-02 ENCOUNTER — Ambulatory Visit (INDEPENDENT_AMBULATORY_CARE_PROVIDER_SITE_OTHER): Payer: Medicare Other | Admitting: Pharmacist

## 2019-01-02 ENCOUNTER — Other Ambulatory Visit: Payer: Self-pay

## 2019-01-02 DIAGNOSIS — Z86718 Personal history of other venous thrombosis and embolism: Secondary | ICD-10-CM

## 2019-01-02 DIAGNOSIS — I480 Paroxysmal atrial fibrillation: Secondary | ICD-10-CM | POA: Diagnosis not present

## 2019-01-02 DIAGNOSIS — Z7901 Long term (current) use of anticoagulants: Secondary | ICD-10-CM

## 2019-01-02 LAB — POCT INR: INR: 4.3 — AB (ref 2.0–3.0)

## 2019-01-09 ENCOUNTER — Other Ambulatory Visit: Payer: Self-pay | Admitting: Cardiology

## 2019-01-10 NOTE — Telephone Encounter (Signed)
Dr. Halford Chessman, please advise on pt's email.  He states he is unable to use the treadmill but instead needs a chemical stress test. Please advise. Thanks.

## 2019-01-11 NOTE — Telephone Encounter (Signed)
-----   Message from Minus Breeding, MD sent at 01/11/2019 10:01 AM EST ----- He could. He did OK with dobutamine stress echo so should not have any severe problem with dynamic outflow obstruction or SAM.  ----- Message ----- From: Chesley Mires, MD Sent: 01/10/2019   1:05 PM EST To: Minus Breeding, MD  Maylon Cos,  I saw Mr. Lehtinen recently.  He has progressive symptoms of dyspnea.  I was thinking about setting him up for a cardiopulmonary stress test.  Mr. Point says he was advised that he shouldn't do any kind of exercise test due to his heart condition.  Can you confirm whether he can undergo cardiopulmonary stress test.  Thanks.  Avion Patella

## 2019-01-11 NOTE — Progress Notes (Signed)
Cardiology Office Note   Date:  01/12/2019   ID:  [name] [name], DOB 04-08-1945, MRN [id number]  PCP:  Binnie Rail, MD  Cardiologist:   Minus Breeding, MD   Chief Complaint  Patient presents with   Shortness of Breath      History of Present Illness: [name] [name] is a 73 y.o. male who presents for of CAD s/p PCI/DES to LAD 07/2013, atrial fibrillations/pablation at the [facility] 2016but remainson coumadin due to ongoing paroxysmal atrial fibrillation, hypertension, AAA, DVT with PE(provoked following surgery ~33yr ago), along with OSA and COPD followed by Dr.Sood.He had DOE and increased HR. He had a cardiac cath In June.    He traveled back up to [place] where he saw his cardiologist up there.  Because he is monitor that he wore to you earlier this year demonstrated some brief runs of SVT and could not exclude atrial fibrillation he wanted to be seen.  I did not think he had any episodes of fibrillation but because of ongoing symptoms I suggested to him that he might need stress echocardiography.   I spoke with the cardiologist and followed up with a dobutamine stress echo to look for an inducible gradient across the aortic outflow tract, SAM and elevated pulmonary pressures.   There was no clear evidence of SAM.  Unfortunately the pulmonary pressures were not able to be measured.  However, there was an increased ventricular gradient with dobutamine that was 100 mm Hg although it was not entirely clear whether the gradient was subvalvular or mid cavity.   I subsequently increased his verapamil.  He did not have improvement and I sent him to Dr. Halford Chessman for a pulmonary evaluation.  I reviewed these records for this appt.    He says that since being on 3 times a day verapamil he is having fewer palpitations.  He is having less dizziness.  He is not having presyncope or syncope.  However, he still short of breath with activities.  He denies any chest pressure, neck or  arm discomfort.  He has had no weight gain or edema.   Past Medical History:  Diagnosis Date   Arthritis    Asthma    BPH (benign prostatic hypertrophy)    CAD (coronary artery disease)    a. 07/2013: s/p DES to LAD, normal LVF.   Complication of anesthesia    "I got all kinds of hallucinations"   COPD (chronic obstructive pulmonary disease) ([place])    a. 07/2013 PFT's mild airflow obstruction, no restriction, sev decrease in DLCO.   Diverticulosis    DVT (deep venous thrombosis) ([place])    a. 2010 Lower ext s/p back surgery.   Dyspnea on exertion    a. 07/2013 PFT's mild airflow obstr    GERD (gastroesophageal reflux disease)    History of gout    History of hiatal hernia    Hx of echocardiogram 2015   Echo (06/2013): EF 60-65% normal wall motion, normal diastolic function, aortic sclerosis without stenosis, Trivial MR, mild SAM due to long, redundant mitral leaflets, mild RAE, normal RVSF   Hypertension    Macrocytic anemia    a. 07/2013: documented on prior labs.   Obesity    OSA (obstructive sleep apnea) 11/14/2015   PAF (paroxysmal atrial fibrillation) ([place])    a. Flecainide discontinued 07/2013 in setting of CAD.; b. s/p PVI Ablation at [facility] (Dr Joseph Berkshire) 11/2014   Pneumonia ~ 2010 X 1   Pulmonary  embolism ([place])    a. 2010 in setting of DVT post-op back surgery. b. Low prob VQ 07/2013.   Sleep apnea    Spinal stenosis    Congential   Transaminitis    a. 07/2013: mild.   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    Past Surgical History:  Procedure Laterality Date   ANTERIOR LUMBAR [place] ARTHROPLASTY  03/2008   "spacer poped out; had to repair"   CATARACT EXTRACTION W/ INTRAOCULAR LENS  IMPLANT, BILATERAL Bilateral    COLONOSCOPY     COLONOSCOPY W/ POLYPECTOMY  2004   Colonoscopy with polypectomy  09/2011   2 tubular adenomas   CORONARY ANGIOPLASTY WITH STENT PLACEMENT  08/08/2013   "1"   JOINT REPLACEMENT     LEFT AND RIGHT HEART  CATHETERIZATION WITH CORONARY ANGIOGRAM N/A 08/07/2013   Procedure: LEFT AND RIGHT HEART CATHETERIZATION WITH CORONARY ANGIOGRAM;  Surgeon: Peter M Martinique, MD;  Location: [facility] CATH LAB;  Service: Cardiovascular;  Laterality: N/A;   LUMBAR FUSION  02/2008   PERCUTANEOUS CORONARY STENT INTERVENTION (PCI-S)  08/07/2013   Procedure: PERCUTANEOUS CORONARY STENT INTERVENTION (PCI-S);  Surgeon: Peter M Martinique, MD;  Location: [facility] CATH LAB;  Service: Cardiovascular;;   PVI ablation  11/2014   Dr. Venita Sheffield [facility]   RIGHT/LEFT HEART CATH AND CORONARY ANGIOGRAPHY N/A 08/15/2018   Procedure: RIGHT/LEFT HEART CATH AND CORONARY ANGIOGRAPHY;  Surgeon: Nelva Bush, MD;  Location: [place] CV LAB;  Service: Cardiovascular;  Laterality: N/A;   TOTAL HIP ARTHROPLASTY Bilateral    VASECTOMY       Current Outpatient Medications  Medication Sig Dispense Refill   albuterol (VENTOLIN HFA) 108 (90 Base) MCG/ACT inhaler Inhale 2 puffs into the lungs every 6 (six) hours as needed for wheezing or shortness of breath. 6.7 g 1   aspirin 81 MG chewable tablet Chew 81 mg by mouth daily.      atorvastatin (LIPITOR) 10 MG tablet Take 10 mg by mouth daily.     Cholecalciferol (VITAMIN D3 PO) Take 1 capsule by mouth daily.     metFORMIN (GLUCOPHAGE) 500 MG tablet Take 1 tablet (500 mg total) by mouth 2 (two) times daily with a meal. 180 tablet 0   metoprolol tartrate (LOPRESSOR) 50 MG tablet Take 2 tablets (100 mg total) by mouth 2 (two) times daily. 180 tablet 1   nitroGLYCERIN (NITROSTAT) 0.4 MG SL tablet Place 1 tablet (0.4 mg total) under the tongue every 5 (five) minutes as needed for chest pain (up to 3 doses). 25 tablet 4   pantoprazole (PROTONIX) 40 MG tablet Take 40 mg by mouth daily.     venlafaxine XR (EFFEXOR-XR) 75 MG 24 hr capsule Take 75 mg by mouth daily with breakfast.     verapamil (CALAN) 80 MG tablet Take 1 tablet (80 mg total) by mouth 3 (three) times daily. 360 tablet 3    vitamin B-12 (CYANOCOBALAMIN) 1000 MCG tablet Take 1,000 mcg by mouth daily.     warfarin (COUMADIN) 3 MG tablet TAKE 2 TABLETS DAILY OR AS DIRECTED BY COUMADIN CLINIC 180 tablet 3   colchicine 0.6 MG tablet Take 2 tabs po x 1 then one hour later take 1 tab po x 1 (Patient not taking: Reported on 01/12/2019) 3 tablet 0   No current facility-administered medications for this visit.     Allergies:   Penicillins     ROS:  Please see the history of present illness.   Otherwise, review of systems are positive for  none.   All other systems are reviewed and negative.    PHYSICAL EXAM: VS:  BP 135/70    Pulse (!) 53    Temp (!) 97.1 F (36.2 C)    Ht 6\' 2"  (1.88 m)    Wt 281 lb (127.5 kg)    SpO2 96%    BMI 36.08 kg/m  , BMI Body mass index is 36.08 kg/m. GENERAL:  Well appearing NECK:  No jugular venous distention, waveform within normal limits, carotid upstroke brisk and symmetric, no bruits, no thyromegaly LUNGS:  Clear to auscultation bilaterally BACK:  No CVA tenderness CHEST:  Well healed sternotomy scar. HEART:  PMI not displaced or sustained,S1 and S2 within normal limits, no S3, no S4, no clicks, no rubs, no murmurs ABD:  Flat, positive bowel sounds normal in frequency in pitch, no bruits, no rebound, no guarding, no midline pulsatile mass, no hepatomegaly, no splenomegaly EXT:  2 plus pulses throughout, no edema, no cyanosis no clubbing    EKG:  EKG is ordered today. The ekg ordered today bradycardia with probable ectopic atrial rhythm, rate 51, axis within normal limits, intervals within normal limits, no acute ST-T wave changes.   Recent Labs: 09/19/2018: ALT 28; BUN 16; Creatinine, Ser 1.09; Hemoglobin 11.2; Platelets 497.0; Potassium 4.8; Sodium 139    Lipid Panel    Component Value Date/Time   CHOL 111 09/19/2018 1442   TRIG 179.0 (H) 09/19/2018 1442   HDL 36.10 (L) 09/19/2018 1442   CHOLHDL 3 09/19/2018 1442   VLDL 35.8 09/19/2018 1442   LDLCALC 39 09/19/2018  1442      Wt Readings from Last 3 Encounters:  01/12/19 281 lb (127.5 kg)  12/26/18 283 lb (128.4 kg)  12/09/18 281 lb 3.2 oz (127.6 kg)      Other studies Reviewed: Additional studies/ records that were reviewed today include: Pulmonary note reviewed and corresponded with Dr. Halford Chessman.  Review of the above records demonstrates:  Please see elsewhere in the note.     ASSESSMENT AND PLAN:  ATRIAL FIBRILLATION:Mr.Paras S McCarthyhas a CHA2DS2 - VASc score of 3.    He seems to be doing better.  No change in therapy.  He will continue the meds as listed.  DYSPNEA:Dr.Sood wants to do a CPX.  I corresponded with Dr. Halford Chessman and I see no contraindication.  [id number] is 3.1 cm.   I will follow this up in a few years.  CAD:  He is not having any chest pain consistent with angina.  No further evaluation.  HTN: Tolerating medications as listed.  Is not having any more orthostasis than he was having.  In fact it seems to be improved.  No change in therapy.  MR:  He had no inducible SAM with dobutamine.  I will follow this clinically and with repeat echocardiography.   SLEEP APNEA:   This is followed by Dr. Halford Chessman.    Current medicines are reviewed at length with the patient today.  The patient does not have concerns regarding medicines.  The following changes have been made:  no change  Labs/ tests ordered today include: None  Orders Placed This Encounter  Procedures   EKG 12-Lead     Disposition:   FU with me in 3 months.     Signed, Minus Breeding, MD  01/12/2019 4:53 PM    [facility]

## 2019-01-11 NOTE — Telephone Encounter (Signed)
Please let [name] now that we heard back from cardiology and it is okay for him to proceed with cardiopulmonary exercise test as scheduled.

## 2019-01-12 ENCOUNTER — Ambulatory Visit (INDEPENDENT_AMBULATORY_CARE_PROVIDER_SITE_OTHER): Payer: Medicare Other | Admitting: Pharmacist

## 2019-01-12 ENCOUNTER — Encounter: Payer: Self-pay | Admitting: Cardiology

## 2019-01-12 ENCOUNTER — Other Ambulatory Visit: Payer: Self-pay

## 2019-01-12 ENCOUNTER — Ambulatory Visit (INDEPENDENT_AMBULATORY_CARE_PROVIDER_SITE_OTHER): Payer: Medicare Other | Admitting: Cardiology

## 2019-01-12 VITALS — BP 135/70 | HR 53 | Temp 97.1°F | Ht 74.0 in | Wt 281.0 lb

## 2019-01-12 DIAGNOSIS — I10 Essential (primary) hypertension: Secondary | ICD-10-CM

## 2019-01-12 DIAGNOSIS — I200 Unstable angina: Secondary | ICD-10-CM | POA: Diagnosis not present

## 2019-01-12 DIAGNOSIS — I340 Nonrheumatic mitral (valve) insufficiency: Secondary | ICD-10-CM | POA: Diagnosis not present

## 2019-01-12 DIAGNOSIS — Z86718 Personal history of other venous thrombosis and embolism: Secondary | ICD-10-CM

## 2019-01-12 DIAGNOSIS — R0602 Shortness of breath: Secondary | ICD-10-CM | POA: Diagnosis not present

## 2019-01-12 DIAGNOSIS — I480 Paroxysmal atrial fibrillation: Secondary | ICD-10-CM

## 2019-01-12 DIAGNOSIS — Z7901 Long term (current) use of anticoagulants: Secondary | ICD-10-CM | POA: Diagnosis not present

## 2019-01-12 DIAGNOSIS — I2510 Atherosclerotic heart disease of native coronary artery without angina pectoris: Secondary | ICD-10-CM

## 2019-01-12 LAB — POCT INR: INR: 3.9 — AB (ref 2.0–3.0)

## 2019-01-12 NOTE — Patient Instructions (Signed)
Medication Instructions:  Your physician recommends that you continue on your current medications as directed. Please refer to the Current Medication list given to you today.  *If you need a refill on your cardiac medications before your next appointment, please call your pharmacy*  Lab Work: NONE ordered at this time of appointment   If you have labs (blood work) drawn today and your tests are completely normal, you will receive your results only by: [name] MyChart Message (if you have MyChart) OR . A paper copy in the mail If you have any lab test that is abnormal or we need to change your treatment, we will call you to review the results.  Testing/Procedures: NONE ordered at this time of appointment   Follow-Up: At [facility], you and your health needs are our priority.  As part of our continuing mission to provide you with exceptional heart care, we have created designated Provider Care Teams.  These Care Teams include your primary Cardiologist (physician) and Advanced Practice Providers (APPs -  Physician Assistants and Nurse Practitioners) who all work together to provide you with the care you need, when you need it.  Your next appointment:   3 months  The format for your next appointment:   In Person  Provider:   You may see Minus Breeding, MD or one of the following Advanced Practice Providers on your designated Care Team:    Rosaria Ferries, PA-C  Jory Sims, DNP, ANP  Cadence Kathlen Mody, NP  Other Instructions

## 2019-01-17 ENCOUNTER — Other Ambulatory Visit: Payer: Self-pay

## 2019-01-17 DIAGNOSIS — I714 Abdominal aortic aneurysm, without rupture, unspecified: Secondary | ICD-10-CM

## 2019-01-20 ENCOUNTER — Telehealth (HOSPITAL_COMMUNITY): Payer: Self-pay

## 2019-01-20 NOTE — Telephone Encounter (Signed)

## 2019-01-23 ENCOUNTER — Other Ambulatory Visit: Payer: Self-pay

## 2019-01-23 ENCOUNTER — Ambulatory Visit (HOSPITAL_COMMUNITY)
Admission: RE | Admit: 2019-01-23 | Discharge: 2019-01-23 | Disposition: A | Discharge status: Home / Self Care | Payer: Medicare Other | Source: Ambulatory Visit | Attending: Family | Admitting: Family

## 2019-01-23 ENCOUNTER — Ambulatory Visit (INDEPENDENT_AMBULATORY_CARE_PROVIDER_SITE_OTHER): Payer: Medicare Other | Admitting: Family

## 2019-01-23 ENCOUNTER — Encounter: Payer: Self-pay | Admitting: Family

## 2019-01-23 VITALS — BP 136/67 | HR 51 | Temp 97.1°F | Resp 18 | Ht 74.0 in | Wt 276.0 lb

## 2019-01-23 DIAGNOSIS — I714 Abdominal aortic aneurysm, without rupture, unspecified: Secondary | ICD-10-CM

## 2019-01-23 DIAGNOSIS — I200 Unstable angina: Secondary | ICD-10-CM

## 2019-01-23 DIAGNOSIS — Z87891 Personal history of nicotine dependence: Secondary | ICD-10-CM | POA: Diagnosis not present

## 2019-01-23 NOTE — Patient Instructions (Signed)
Before your next abdominal ultrasound:  Avoid gas forming foods and beverages the day before the test.   Take one Phazyme capsule at bedtime the night before the test. Do not chew gum.     Abdominal Aortic Aneurysm  An aneurysm is a bulge in one of the blood vessels that carry blood away from the heart (artery). It happens when blood pushes up against a weak or damaged place in the wall of an artery. An abdominal aortic aneurysm happens in the main artery of the body (aorta). Some aneurysms may not cause problems. If it grows, it can burst or tear, causing bleeding inside the body. This is an emergency. It needs to be treated right away. What are the causes? The exact cause of this condition is not known. What increases the risk? The following may make you more likely to get this condition:  Being a male who is 74 years of age or older.  Being white (Caucasian).  Using tobacco.  Having a family history of aneurysms.  Having the following conditions: ? Hardening of the arteries (arteriosclerosis). ? Inflammation of the walls of an artery (arteritis). ? Certain genetic conditions. ? Being very overweight (obesity). ? An infection in the wall of the aorta (infectious aortitis). ? High cholesterol. ? High blood pressure (hypertension). What are the signs or symptoms? Symptoms depend on the size of the aneurysm and how fast it is growing. Most grow slowly and do not cause any symptoms. If symptoms do occur, they may include:  Pain in the belly (abdomen), side, or back.  Feeling full after eating only small amounts of food.  Feeling a throbbing lump in the belly. Symptoms that the aneurysm has burst (ruptured) include:  Sudden, very bad pain in the belly, side, or back.  Feeling sick to your stomach (nauseous).  Throwing up (vomiting).  Feeling light-headed or passing out. How is this treated? Treatment for this condition depends on:  The size of the aneurysm.  How  fast it is growing.  Your age. You are 73  Your risk of having it burst. If your aneurysm is smaller than 2 inches (5 cm), your doctor may manage it by:  Checking it often to see if it is getting bigger. You may have an imaging test (ultrasound) to check it every 3-6 months, every year, or every few years.  Giving you medicines to: ? Control blood pressure. ? Treat pain. ? Fight infection. If your aneurysm is larger than 2 inches (5 cm), you may need surgery to fix it. Follow these instructions at home: Lifestyle  Do not use any products that have nicotine or tobacco in them. This includes cigarettes, e-cigarettes, and chewing tobacco. If you need help quitting, ask your doctor.  Get regular exercise. Ask your doctor what types of exercise are best for you. Eating and drinking  Eat a heart-healthy diet. This includes eating plenty of: ? Fresh fruits and vegetables. ? Whole grains. ? Low-fat (lean) protein. ? Low-fat dairy products.  Avoid foods that are high in saturated fat and cholesterol. These foods include red meat and some dairy products.  Do not drink alcohol if: ? Your doctor tells you not to drink. ? You are pregnant, may be pregnant, or are planning to become pregnant.  If you drink alcohol: ? Limit how much you use to:  0-1 drink a day for women.  0-2 drinks a day for men. ? Be aware of how much alcohol is in your drink. In the [place] one  drink equals any of these:  One typical bottle of beer (12 oz).  One-half glass of wine (5 oz).  One shot of hard liquor (1 oz). General instructions  Take over-the-counter and prescription medicines only as told by your doctor.  Keep your blood pressure within normal limits. Ask your doctor what your blood pressure should be.  Have your blood sugar (glucose) level and cholesterol levels checked regularly. Keep your blood sugar level and cholesterol levels within normal limits.  Avoid heavy lifting and activities that take a  lot of effort. Ask your doctor what activities are safe for you.  Keep all follow-up visits as told by your doctor. This is important. ? Talk to your doctor about regular screenings to see if the aneurysm is getting bigger. Contact a doctor if you:  Have pain in your belly, side, or back.  Have a throbbing feeling in your belly.  Have a family history of aneurysms. Get help right away if you:  Have sudden, bad pain in your belly, side, or back.  Feel sick to your stomach.  Throw up.  Have trouble pooping (constipation).  Have trouble peeing (urinating).  Feel light-headed.  Have a fast heart rate when you stand.  Have sweaty skin that is cold to the touch (clammy).  Have shortness of breath.  Have a fever. These symptoms may be an emergency. Do not wait to see if the symptoms will go away. Get medical help right away. Call your local emergency services (911 in the [place] Do not drive yourself to the hospital. Summary  An aneurysm is a bulge in one of the blood vessels that carry blood away from the heart (artery). Some aneurysms may not cause problems.  You may need to have yours checked often. If it grows, it can burst or tear. This causes bleeding inside the body. It needs to be treated right away.  Follow instructions from your doctor about healthy lifestyle changes.  Keep all follow-up visits as told by your doctor. This is important. This information is not intended to replace advice given to you by your health care provider. Make sure you discuss any questions you have with your health care provider. Document Released: 06/20/2012 Document Revised: 06/13/2018 Document Reviewed: 10/02/2017 Elsevier Patient Education  2020 [employer].

## 2019-01-23 NOTE — Progress Notes (Signed)
[facility] [place]   CC: Follow up Abdominal Aortic Aneurysm  History of Present Illness  [name] [name] is a 73 y.o. (1945-07-07) male initally referred by Dr. Ignacia Palma to Dr. Kellie Simmering for evaluation of small aortic aneurysm. Patient went to the emergency department at [facility] because of left flank discomfort. Thorough evaluation included CT scan of the abdomen. This revealed a 3.2 cm terminal aorta with no evidence of dissection or leakage. His flank pain disappeared within 7 days and has not reoccurred.  He returns today for follow up of a small abdominal aortic aneurysm.  He has had lumbar spine surgery in the past and occasionally has moderate low back pain, non radiating; he denies abdominal pain.  He has numbness in his left calf to foot that started after one of his back surgeries, has not changed.  Pt states he has a chronic cough from COPD, denies that his dyspnea or cough is any worse than usual. He gets dizzy and dyspneic after walking about 150 feet; he is being evaluated for this.  He sees a pulmonologist and a cardiologist.   The patient denies claudication type symptoms in his legs with walking. The patient denies any history of stroke or TIA symptoms.  His medications include a daily 81 mg ASA, warfarin (for atrial fib), and a statin.   Diabetic: No Tobacco use: former smoker, quit in 2002, 2 ppd x 40 years  Past Medical History:  Diagnosis Date  . Arthritis   . Asthma   . BPH (benign prostatic hypertrophy)   . CAD (coronary artery disease)    a. 07/2013: s/p DES to LAD, normal LVF.  [name] Complication of anesthesia    "I got all kinds of hallucinations"  . COPD (chronic obstructive pulmonary disease) ([place])    a. 07/2013 PFT's mild airflow obstruction, no restriction, sev decrease in DLCO.  . Diverticulosis   . DVT (deep venous thrombosis) ([place])    a. 2010 Lower ext s/p back surgery.  [name] Dyspnea on exertion    a. 07/2013 PFT's mild  airflow obstr   . GERD (gastroesophageal reflux disease)   . History of gout   . History of hiatal hernia   . Hx of echocardiogram 2015   Echo (06/2013): EF 60-65% normal wall motion, normal diastolic function, aortic sclerosis without stenosis, Trivial MR, mild SAM due to long, redundant mitral leaflets, mild RAE, normal RVSF  . Hypertension   . Macrocytic anemia    a. 07/2013: documented on prior labs.  . Obesity   . OSA (obstructive sleep apnea) 11/14/2015  . PAF (paroxysmal atrial fibrillation) ([place])    a. Flecainide discontinued 07/2013 in setting of CAD.; b. s/p PVI Ablation at [facility] (Dr Joseph Berkshire) 11/2014  . Pneumonia ~ 2010 X 1  . Pulmonary embolism ([place])    a. 2010 in setting of DVT post-op back surgery. b. Low prob VQ 07/2013.  [name] Sleep apnea   . Spinal stenosis    Congential  . Transaminitis    a. 07/2013: mild.  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 ANTERIOR LUMBAR [place] ARTHROPLASTY  03/2008   "spacer poped out; had to repair"  . CATARACT EXTRACTION W/ INTRAOCULAR LENS  IMPLANT, BILATERAL Bilateral   . COLONOSCOPY    . COLONOSCOPY W/ POLYPECTOMY  2004  . Colonoscopy with polypectomy  09/2011   2 tubular adenomas  . CORONARY ANGIOPLASTY WITH STENT PLACEMENT  08/08/2013   "1"  . JOINT REPLACEMENT    . LEFT AND RIGHT HEART CATHETERIZATION WITH CORONARY ANGIOGRAM N/A 08/07/2013   Procedure: LEFT AND RIGHT HEART CATHETERIZATION WITH CORONARY ANGIOGRAM;  Surgeon: Peter M Martinique, MD;  Location: [facility] CATH LAB;  Service: Cardiovascular;  Laterality: N/A;  . LUMBAR FUSION  02/2008  . PERCUTANEOUS CORONARY STENT INTERVENTION (PCI-S)  08/07/2013   Procedure: PERCUTANEOUS CORONARY STENT INTERVENTION (PCI-S);  Surgeon: Peter M Martinique, MD;  Location: [facility] CATH LAB;  Service: Cardiovascular;;  . PVI ablation  11/2014   Dr. Venita Sheffield [facility]  . RIGHT/LEFT HEART CATH AND CORONARY ANGIOGRAPHY N/A 08/15/2018   Procedure:  RIGHT/LEFT HEART CATH AND CORONARY ANGIOGRAPHY;  Surgeon: Nelva Bush, MD;  Location: [place] CV LAB;  Service: Cardiovascular;  Laterality: N/A;  . TOTAL HIP ARTHROPLASTY Bilateral   . VASECTOMY     Social History Social History   Socioeconomic History  . Marital status: Married    Spouse name: Not on file  . Number of children: 4  . Years of education: Not on file  . Highest education level: Not on file  Occupational History  . Occupation: Retired from [employer]: RETIRED  Social Needs  . Financial resource strain: Not hard at all  . Food insecurity    Worry: Never true    Inability: Never true  . Transportation needs    Medical: No    Non-medical: No  Tobacco Use  . Smoking status: Former Smoker    Packs/day: 2.00    Years: 40.00    Pack years: 80.00    Types: Cigarettes    Quit date: 03/09/2000    Years since quitting: 18.8  . Smokeless tobacco: Never Used  . Tobacco comment: smoked 1964-2002, up to 3 ppd  Substance and Sexual Activity  . Alcohol use: Yes    Alcohol/week: 2.0 - 3.0 standard drinks    Types: 2 - 3 Cans of beer per week    Comment: drinks beer several times weekly  . Drug use: No  . Sexual activity: Never    Comment: "sexually hx is none of your business" (02/08/2014)  Lifestyle  . Physical activity    Days per week: 0 days    Minutes per session: 0 min  . Stress: Only a little  Relationships  . Social connections    Talks on phone: More than three times a week    Gets together: More than three times a week    Attends religious service: Not on file    Active member of club or organization: Not on file    Attends meetings of clubs or organizations: Not on file    Relationship status: Married  . Intimate partner violence    Fear of current or ex partner: No    Emotionally abused: No    Physically abused: No    Forced sexual activity: No  Other Topics Concern  . Not on file  Social History Narrative  . Not on file   Family  History Family History  Problem Relation Age of Onset  . Heart attack Paternal Grandfather        Over 25  . Atrial fibrillation Mother   . COPD Mother   . Heart disease Father   . Colon cancer Father   . Heart attack Brother 34  . Heart disease Brother   . Alcohol abuse Brother 18       Died post liver transplant  .  Heart attack Paternal Uncle        Less than 21  . Stroke Neg Hx     Current Outpatient Medications on File Prior to Visit  Medication Sig Dispense Refill  . albuterol (VENTOLIN HFA) 108 (90 Base) MCG/ACT inhaler Inhale 2 puffs into the lungs every 6 (six) hours as needed for wheezing or shortness of breath. 6.7 g 1  . aspirin 81 MG chewable tablet Chew 81 mg by mouth daily.     [name] atorvastatin (LIPITOR) 10 MG tablet Take 10 mg by mouth daily.    . Cholecalciferol (VITAMIN D3 PO) Take 1 capsule by mouth daily.    . colchicine 0.6 MG tablet Take 2 tabs po x 1 then one hour later take 1 tab po x 1 3 tablet 0  . metFORMIN (GLUCOPHAGE) 500 MG tablet Take 1 tablet (500 mg total) by mouth 2 (two) times daily with a meal. 180 tablet 0  . metoprolol tartrate (LOPRESSOR) 50 MG tablet Take 2 tablets (100 mg total) by mouth 2 (two) times daily. 180 tablet 1  . nitroGLYCERIN (NITROSTAT) 0.4 MG SL tablet Place 1 tablet (0.4 mg total) under the tongue every 5 (five) minutes as needed for chest pain (up to 3 doses). 25 tablet 4  . pantoprazole (PROTONIX) 40 MG tablet Take 40 mg by mouth daily.    [name] venlafaxine XR (EFFEXOR-XR) 75 MG 24 hr capsule Take 75 mg by mouth daily with breakfast.    . verapamil (CALAN) 80 MG tablet Take 1 tablet (80 mg total) by mouth 3 (three) times daily. 360 tablet 3  . vitamin B-12 (CYANOCOBALAMIN) 1000 MCG tablet Take 1,000 mcg by mouth daily.    [name] warfarin (COUMADIN) 3 MG tablet TAKE 2 TABLETS DAILY OR AS DIRECTED BY COUMADIN CLINIC 180 tablet 3   No current facility-administered medications on file prior to visit.    Allergies  Allergen Reactions  .  Penicillins Anaphylaxis    Did it involve swelling of the face/tongue/throat, SOB, or low BP? Yes Did it involve sudden or severe rash/hives, skin peeling, or any reaction on the inside of your mouth or nose? Yes Did you need to seek medical attention at a hospital or doctor's office? Yes When did it last happen?30 + years If all above answers are "NO", may proceed with cephalosporin use.     ROS: See HPI for pertinent positives and negatives.  Physical Examination  Vitals:   01/23/19 0837  BP: 136/67  Pulse: (!) 51  Resp: 18  Temp: (!) 97.1 F (36.2 C)  TempSrc: Temporal  SpO2: 98%  Weight: 276 lb (125.2 kg)  Height: 6\' 2"  (1.88 m)   Body mass index is 35.44 kg/m.  General: A&O x 3, WD, obese male in NAD. HEENT: Grossly intact and WNL.  Pulmonary: Sym exp, respirations are non labored at rest, somewhat labored in prone position on exam table with head elevated 30 degrees, limited air movement in all fields, no rales, rhonchi, or wheezes Cardiac: Regular rhythm and rate, no detected murmur.  Carotid Bruits Right Left   Negative Negative   Adominal aortic pulse is not palpable Radial pulses are 2+ palpable right, 1+ palpable left                          VASCULAR EXAM:  LE Pulses Right Left       FEMORAL  2+ palpable  not palpable        POPLITEAL  not palpable   not palpable       POSTERIOR TIBIAL  2+ palpable   2+ palpable        DORSALIS PEDIS      ANTERIOR TIBIAL 2+ palpable  2+ palpable     Gastrointestinal: soft, NTND, -G/R, - HSM, - masses palpated, - CVAT B. Musculoskeletal: M/S 5/5 throughout, Extremities without ischemic changes. Skin: No rashes, no ulcers, no cellulitis.   Neurologic: CN 2-12 intact, Pain and light touch intact in extremities are intact, Motor exam as listed above. Psychiatric: Normal thought content, mood appropriate to  clinical situation.    Non-Invasive Vascular Imaging  AAA Duplex (01-23-19:  Abdominal Aorta Findings: +-----------+----------+-----------+----------+-----------+----------+---------+ Location   AP (cm)   Trans (cm) PSV (cm/s)Waveform   Thrombus  Comments  +-----------+----------+-----------+----------+-----------+----------+---------+ Supraceliac2.91      2.75       84                                       +-----------+----------+-----------+----------+-----------+----------+---------+ Proximal   2.88      2.65       52                                       +-----------+----------+-----------+----------+-----------+----------+---------+ Mid        Not       Not        Not       Not        Not       Obscured             VisualizedVisualized VisualizedVisualized Visualizedby Bowel                                                                 Gas       +-----------+----------+-----------+----------+-----------+----------+---------+ Distal     3.16      3.34       98                                       +-----------+----------+-----------+----------+-----------+----------+---------+ RT CIA Prox1.8       1.7        117                                      +-----------+----------+-----------+----------+-----------+----------+---------+ LT CIA Prox1.8       1.8        80                                       +-----------+----------+-----------+----------+-----------+----------+---------+  Summary: Abdominal Aorta: There is evidence of abnormal dilatation of the distal Abdominal aorta. The largest aortic measurement is 3.3 cm.  Mid  aorta segment was not visualized; obscured by bowel gas and body habitus.  The largest aortic diameter has decreased compared to prior exam. Previous diameter measurement was 3.8 cm obtained on 07/06/2017. However, the exam performed on 05/31/2015 measured the largest aortic diameter as  3.15. Differences may be attributed to technically difficult exams related body habitus and bowel gas.   Medical Decision Making  The patient is a 73 y.o. male who presents with asymptomatic AAA with no increase in size, at 3.3 cm today compared to 3.8 cm on 07-06-17, based on limited visualization.   Based on this patient's exam and diagnostic studies, the patient will follow up in 2 years with the following studies: AAA Duplex.  Consideration for repair of AAA would be made when the size is 5.5cm, growth > 1 cm/yr, and symptomatic status.  Abdominal aortic aneurysm less than 5-1/2 cm in diameter has less then 1/2% risk of rupture per year.        The patient was given information about AAA including signs, symptoms, treatment, and how to minimize the risk of enlargement and rupture of aneurysms.    I emphasized the importance of maximal medical management including strict control of blood pressure, blood glucose, and lipid levels, antiplatelet agents, obtaining regular exercise, and continued cessation of smoking.   The patient was advised to call 911 should the patient experience sudden onset abdominal or back pain.   Thank you for allowing [place] to participate in this patient's care.  Clemon Chambers, RN, MSN, FNP-C [facility] [place] Office: [facility] Physician: Trula Slade  01/23/2019, 8:45 AM

## 2019-01-24 ENCOUNTER — Encounter: Payer: Self-pay | Admitting: Gastroenterology

## 2019-01-24 ENCOUNTER — Other Ambulatory Visit: Payer: Self-pay

## 2019-01-24 ENCOUNTER — Ambulatory Visit (INDEPENDENT_AMBULATORY_CARE_PROVIDER_SITE_OTHER): Payer: Medicare Other | Admitting: Gastroenterology

## 2019-01-24 ENCOUNTER — Telehealth: Payer: Self-pay

## 2019-01-24 VITALS — BP 118/64 | HR 58 | Temp 97.8°F | Ht 74.0 in | Wt 278.2 lb

## 2019-01-24 DIAGNOSIS — Z860100 Personal history of colon polyps, unspecified: Secondary | ICD-10-CM

## 2019-01-24 DIAGNOSIS — I200 Unstable angina: Secondary | ICD-10-CM

## 2019-01-24 DIAGNOSIS — K5900 Constipation, unspecified: Secondary | ICD-10-CM | POA: Diagnosis not present

## 2019-01-24 DIAGNOSIS — Z1159 Encounter for screening for other viral diseases: Secondary | ICD-10-CM | POA: Diagnosis not present

## 2019-01-24 DIAGNOSIS — Z7901 Long term (current) use of anticoagulants: Secondary | ICD-10-CM

## 2019-01-24 MED ORDER — POLYETHYLENE GLYCOL 3350 17 G PO PACK: 17.0000 g | PACK | Freq: Two times a day (BID) | ORAL | 0 refills | Status: DC

## 2019-01-24 NOTE — Telephone Encounter (Signed)
[facility] Pre-operative Risk Assessment     Request for surgical clearance:     Endoscopy Procedure  What type of surgery is being performed?     COLONOSCOPY  When is this surgery scheduled?     02-14-19  What type of clearance is required ?   Pharmacy  Are there any medications that need to be held prior to surgery and how long? COUMADIN 5 DAYS  Practice name and name of physician performing surgery? Wide Ruins Cellar, MD  [facility]  What is your office phone and fax number?      Phone- [phone]  Fax- [phone]  ATTN: Tia Alert  Anesthesia type (None, local, MAC, general) ?       MAC  Thank you!

## 2019-01-24 NOTE — Progress Notes (Signed)
HPI :  73 year old male here for follow-up visit.  I performed his colonoscopy in April 2017 at which point time he had 4 small adenomas, diverticulosis, hemorrhoids, and a fair prep which took several minutes to clear the colon.  He remains on Coumadin for history of DVT and PE.  He is due for surveillance colonoscopy at this time.  He states he has had some bowel changes recently.  Over the past 3 weeks he became severely constipated, at one point time going about 8 days in between bowel movements.  He denies any changes in his medications to account for this.  He has not seen any blood in his stools.  He has not been taking much of any laxatives as a preventative measure, he tried some over-the-counter laxatives when he was severely constipated which did seem to help.  His father had colon cancer diagnosed in age 68s.  He otherwise denies any other abdominal pains.  He is eating well and denies other digestive complaints.   He recently had a stress echo in September showing a normal ejection fraction, he states he has a formal cardiopulmonary exercise test pending for next week.  He wants to perform this colonoscopy as soon as he can.  Colonoscopy 06/25/2015 -  4 small adenomas, diverticulosis, hemorrhoids - fair prep required a lot of time to lavage the colon but adequate views obtained.   Echo 11/28/18 - EF 55-60%    Past Medical History:  Diagnosis Date  . Arthritis   . Asthma   . BPH (benign prostatic hypertrophy)   . CAD (coronary artery disease)    a. 07/2013: s/p DES to LAD, normal LVF.  [name] Complication of anesthesia    "I got all kinds of hallucinations"  . COPD (chronic obstructive pulmonary disease) ([place])    a. 07/2013 PFT's mild airflow obstruction, no restriction, sev decrease in DLCO.  . Diverticulosis   . DVT (deep venous thrombosis) ([place])    a. 2010 Lower ext s/p back surgery.  [name] Dyspnea on exertion    a. 07/2013 PFT's mild airflow obstr   . GERD (gastroesophageal  reflux disease)   . History of gout   . History of hiatal hernia   . Hx of echocardiogram 2015   Echo (06/2013): EF 60-65% normal wall motion, normal diastolic function, aortic sclerosis without stenosis, Trivial MR, mild SAM due to long, redundant mitral leaflets, mild RAE, normal RVSF  . Hypertension   . Macrocytic anemia    a. 07/2013: documented on prior labs.  . Obesity   . OSA (obstructive sleep apnea) 11/14/2015  . PAF (paroxysmal atrial fibrillation) ([place])    a. Flecainide discontinued 07/2013 in setting of CAD.; b. s/p PVI Ablation at [facility] (Dr Joseph Berkshire) 11/2014  . Pneumonia ~ 2010 X 1  . Pulmonary embolism ([place])    a. 2010 in setting of DVT post-op back surgery. b. Low prob VQ 07/2013.  [name] Sleep apnea   . Spinal stenosis    Congential  . Transaminitis    a. 07/2013: mild.  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 ANTERIOR LUMBAR [place] ARTHROPLASTY  03/2008   "spacer poped out; had to repair"  . CATARACT EXTRACTION W/ INTRAOCULAR LENS  IMPLANT, BILATERAL Bilateral   . COLONOSCOPY    . COLONOSCOPY W/ POLYPECTOMY  2004  . Colonoscopy with polypectomy  09/2011   2 tubular adenomas  . CORONARY ANGIOPLASTY WITH STENT PLACEMENT  08/08/2013   "1"  . JOINT REPLACEMENT    . LEFT AND RIGHT HEART CATHETERIZATION WITH CORONARY ANGIOGRAM N/A 08/07/2013   Procedure: LEFT AND RIGHT HEART CATHETERIZATION WITH CORONARY ANGIOGRAM;  Surgeon: Peter M Martinique, MD;  Location[facility] C[facility] LAB;  Service: Cardiovascular;  Laterality: N/A;  . LUMBAR FUSION  02/2008  . PERCUTANEOUS CORONARY STENT INTERVENTION (PCI-S)  08/07/2013   Procedure: PERCUTANEOUS CORONARY STENT INTERVENTION (PCI-S);  Surgeon: Peter M Martinique, MD;  Location: [facility] CATH LAB;  Service: Cardiovascular;;  . PVI ablation  11/2014   Dr. Venita [facility]  . RIGHT/LEFT HEART CATH AND CORONARY ANGIOGRAPHY N/A 08/15/2018   Procedure: RIGHT/LEFT HEART CATH AND CORONARY  ANGIOGRAPHY;  Surgeon: Nelva Bush, MD;  Location: [place] CV LAB;  Service: Cardiovascular;  Laterality: N/A;  . TOTAL HIP ARTHROPLASTY Bilateral   . VASECTOMY     Family History  Problem Relation Age of Onset  . Heart attack Paternal Grandfather        Over 59  . Atrial fibrillation Mother   . COPD Mother   . Heart disease Father   . Colon cancer Father   . Heart attack Brother 55  . Heart disease Brother   . Alcohol abuse Brother 60       Died post liver transplant  . Heart attack Paternal Uncle        Less than 66  . Stroke Neg Hx    Social History   Tobacco Use  . Smoking status: Former Smoker    Packs/day: 2.00    Years: 40.00    Pack years: 80.00    Types: Cigarettes    Quit date: 03/09/2000    Years since quitting: 18.8  . Smokeless tobacco: Never Used  . Tobacco comment: smoked 1964-2002, up to 3 ppd  Substance Use Topics  . Alcohol use: Yes    Alcohol/week: 2.0 - 3.0 standard drinks    Types: 2 - 3 Cans of beer per week    Comment: drinks beer several times weekly  . Drug use: No   Current Outpatient Medications  Medication Sig Dispense Refill  . albuterol (VENTOLIN HFA) 108 (90 Base) MCG/ACT inhaler Inhale 2 puffs into the lungs every 6 (six) hours as needed for wheezing or shortness of breath. 6.7 g 1  . aspirin 81 MG chewable tablet Chew 81 mg by mouth daily.     [name] atorvastatin (LIPITOR) 10 MG tablet Take 10 mg by mouth daily.    . Cholecalciferol (VITAMIN D3 PO) Take 1 capsule by mouth daily.    . metFORMIN (GLUCOPHAGE) 500 MG tablet Take 1 tablet (500 mg total) by mouth 2 (two) times daily with a meal. 180 tablet 0  . metoprolol tartrate (LOPRESSOR) 50 MG tablet Take 2 tablets (100 mg total) by mouth 2 (two) times daily. 180 tablet 1  . nitroGLYCERIN (NITROSTAT) 0.4 MG SL tablet Place 1 tablet (0.4 mg total) under the tongue every 5 (five) minutes as needed for chest pain (up to 3 doses). 25 tablet 4  . pantoprazole (PROTONIX) 40 MG tablet Take 40  mg by mouth daily.    [name] venlafaxine XR (EFFEXOR-XR) 75 MG 24 hr capsule Take 75 mg by mouth daily with breakfast.    . verapamil (CALAN) 80 MG tablet Take 1 tablet (80 mg total) by mouth 3 (three) times daily. 360 tablet 3  . vitamin B-12 (CYANOCOBALAMIN) 1000 MCG tablet Take 1,000 mcg by mouth daily.    [name] warfarin (  COUMADIN) 3 MG tablet TAKE 2 TABLETS DAILY OR AS DIRECTED BY COUMADIN CLINIC 180 tablet 3   No current facility-administered medications for this visit.    Allergies  Allergen Reactions  . Penicillins Anaphylaxis    Did it involve swelling of the face/tongue/throat, SOB, or low BP? Yes Did it involve sudden or severe rash/hives, skin peeling, or any reaction on the inside of your mouth or nose? Yes Did you need to seek medical attention at a hospital or doctor's office? Yes When did it last happen?30 + years If all above answers are "NO", may proceed with cephalosporin use.      Review of Systems: All systems reviewed and negative except where noted in HPI.   Lab Results  Component Value Date   WBC 9.9 09/19/2018   HGB 11.2 (L) 09/19/2018   HCT 33.9 (L) 09/19/2018   MCV 107.0 (H) 09/19/2018   PLT 497.0 (H) 09/19/2018    Lab Results  Component Value Date   CREATININE 1.09 09/19/2018   BUN 16 09/19/2018   NA 139 09/19/2018   K 4.8 09/19/2018   CL 104 09/19/2018   CO2 27 09/19/2018    Lab Results  Component Value Date   ALT 28 09/19/2018   AST 29 09/19/2018   ALKPHOS 78 09/19/2018   BILITOT 0.5 09/19/2018      Physical Exam: BP 118/64   Pulse (!) 58   Temp 97.8 F (36.6 C)   Ht 6\' 2"  (1.88 m)   Wt 278 lb 3.2 oz (126.2 kg)   BMI 35.72 kg/m  Constitutional: Pleasant,well-developed, male in no acute distress. HEENT: Normocephalic and atraumatic. Conjunctivae are normal. No scleral icterus. Neck supple.  Cardiovascular: Normal rate, regular rhythm.  Pulmonary/chest: Effort normal and breath sounds normal. No wheezing, rales or rhonchi.  Abdominal: Soft, protuberant, nontender. There are no masses palpable. No hepatomegaly. Extremities: no edema Lymphadenopathy: No cervical adenopathy noted. Neurological: Alert and oriented to person place and time. Skin: Skin is warm and dry. No rashes noted. Psychiatric: Normal mood and affect. Behavior is normal.   ASSESSMENT AND PLAN: 73 year old male here for reassessment of the following:  Constipation - relatively new onset constipation without any other clear triggers.  Constipation has been severe recently but not taking any maintenance regimens.  Discussed options with him, recommend trial of MiraLAX twice a day and titrate up or down as needed.  If this does not help him, I asked him to contact me and we will discuss other options.  He agreed  History of colon polyps / anticoagulated - he is due for surveillance colonoscopy with multiple adenomas removed on his last exam in the setting of a fair prep.  We discussed if he wanted to proceed with this and he does, he will require double preparation especially in light of his recent constipation.  I discussed risks and benefits of colonoscopy and anesthesia and he wanted to proceed.  He does have additional cardiac testing next week, his prior stress echo looked okay.  He will be scheduled to have this done in the upcoming weeks, after his next cardiac test is done, and if he has any additional cardiac work-up that needs to be done before his exam, I asked him to let me know so we can change the date.  Otherwise he will need approval to hold his Coumadin for 5 days prior to the exam, we will reach out to his prescribing provider to obtain that.  Further recommendations pending the results,  he agreed with plan.   Blackshear Cellar, MD [facility]

## 2019-01-24 NOTE — Patient Instructions (Addendum)
If you are age 74 or older, your body mass index should be between 23-30. Your Body mass index is 35.72 kg/m. If this is out of the aforementioned range listed, please consider follow up with your Primary Care Provider.  If you are age 29 or younger, your body mass index should be between 19-25. Your Body mass index is 35.72 kg/m. If this is out of the aformentioned range listed, please consider follow up with your Primary Care Provider.   Due to recent COVID-19 restrictions implemented by [employer] and state authorities and in an effort to keep both patients and staff as safe as possible, [facility] requires COVID-19 testing prior to any scheduled endoscopic procedure. The testing center is located at [address] [place], [place] [address] in the [facility] [employer]  suite.  Your appointment has been scheduled for Friday, 02-10-19 at 11:30am.   Please bring your insurance cards to this appointment. You will require your COVID screen 2 business days prior to your endoscopic procedure.  You are not required to quarantine after your screening.  You will only receive a phone call with the results if it is POSITIVE.  If you do not receive a call the day before your procedure you should begin your prep, if ordered, and you should report to the endo center for your procedure at your designated appointment arrival time ( one hour prior to the procedure time). There is no cost to you for the screening on the day of the swab.  [facility] will file with your insurance company for the testing.    You may receive an automated phone call prior to your procedure or have a message in your MyChart that you have an appointment for a BP/15 at the [facility], please disregard this message.  Your testing will be at the [place] [place] location.   If you are leaving [facility] [facility] travel [place] on [address], turn left onto [facility] [address], turn night onto [address] at the 1st stop light turn right, pass the [employer] on your right and proceed to [place] (white building).    You will be contacted by our office prior to your procedure for directions on holding your Coumadin.  If you do not hear from our office 1 week prior to your scheduled procedure, please call [phone] to discuss.   Please purchase the following medications over the counter and take as directed: Miralax: Take as directed twice a day. Increase as needed  We are giving you samples and a coupon for Miralax today.  Thank you for entrusting me with your care and for choosing [facility], Dr. [place] Cellar

## 2019-01-24 NOTE — Telephone Encounter (Signed)
   Primary Cardiologist: Minus Breeding, MD  Chart reviewed as part of pre-operative protocol coverage. Given past medical history and time since last visit, based on ACC/AHA guidelines, [name] [name] would be at acceptable risk for the planned procedure without further cardiovascular testing.   Pt was last seen by Dr. Percival Spanish 01/12/19 with improved palpitations after titration of Verapamil to three times daily. Previously evaluated for MR with dobutamine stress test with no inducible SAM or outflow tract obstruction. He was noted to have ongoing SOB and was to follow with Dr. Halford Chessman with pulmonary medicine for this. Plan is for cardiopulmonary stress testing per pulmonary team. From a cardiac perspective, it seems reasonable to proceed with the procedure. Would leave scheduling up to surgeon as to whether they would want to proceed prior to pulmonary testing.   Patient with diagnosis of afib on warfarin for anticoagulation.    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    Patient will NOT need bridging with Lovenox (enoxaparin) around procedure.   I will route this recommendation to the requesting party via Epic fax function and remove from pre-op pool.  Please call with questions.  Kathyrn Drown, NP 01/24/2019, 12:54 PM

## 2019-01-24 NOTE — Telephone Encounter (Signed)
Called pt. Relayed clearance to hold Coumadin for 5 days prior to procedure.  He understands to take on December 2nd but to hold 12-3 thru 12-7 and Dr. Loni Muse will let him know when to resume after the  Procedure.

## 2019-01-24 NOTE — Telephone Encounter (Signed)
Patient with diagnosis of afib on warfarin for anticoagulation.    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    Patient will NOT need bridging with Lovenox (enoxaparin) around procedure.

## 2019-01-27 ENCOUNTER — Other Ambulatory Visit (HOSPITAL_COMMUNITY)
Admission: RE | Admit: 2019-01-27 | Discharge: 2019-01-27 | Disposition: A | Discharge status: Home / Self Care | Payer: Medicare Other | Source: Ambulatory Visit | Attending: Pulmonary Disease | Admitting: Pulmonary Disease

## 2019-01-27 DIAGNOSIS — Z01812 Encounter for preprocedural laboratory examination: Secondary | ICD-10-CM | POA: Diagnosis not present

## 2019-01-27 DIAGNOSIS — Z20828 Contact with and (suspected) exposure to other viral communicable diseases: Secondary | ICD-10-CM | POA: Diagnosis not present

## 2019-01-27 LAB — SARS CORONAVIRUS 2 (TAT 6-24 HRS): SARS Coronavirus 2: NEGATIVE

## 2019-01-30 ENCOUNTER — Ambulatory Visit (HOSPITAL_COMMUNITY): Payer: Medicare Other | Attending: Pulmonary Disease

## 2019-01-30 DIAGNOSIS — R0609 Other forms of dyspnea: Secondary | ICD-10-CM

## 2019-01-30 DIAGNOSIS — R0600 Dyspnea, unspecified: Secondary | ICD-10-CM

## 2019-02-01 ENCOUNTER — Telehealth: Payer: Self-pay | Admitting: Internal Medicine

## 2019-02-01 NOTE — Telephone Encounter (Signed)
Negative COVID results given. Patient results "NOT Detected." Caller expressed understanding. ° °

## 2019-02-03 ENCOUNTER — Other Ambulatory Visit: Payer: Self-pay | Admitting: Cardiology

## 2019-02-06 ENCOUNTER — Ambulatory Visit (INDEPENDENT_AMBULATORY_CARE_PROVIDER_SITE_OTHER): Payer: Medicare Other | Admitting: Pharmacist

## 2019-02-06 ENCOUNTER — Other Ambulatory Visit: Payer: Self-pay

## 2019-02-06 DIAGNOSIS — Z7901 Long term (current) use of anticoagulants: Secondary | ICD-10-CM | POA: Diagnosis not present

## 2019-02-06 DIAGNOSIS — Z86718 Personal history of other venous thrombosis and embolism: Secondary | ICD-10-CM

## 2019-02-06 DIAGNOSIS — I480 Paroxysmal atrial fibrillation: Secondary | ICD-10-CM

## 2019-02-06 LAB — POCT INR: INR: 3.4 — AB (ref 2.0–3.0)

## 2019-02-10 ENCOUNTER — Ambulatory Visit (INDEPENDENT_AMBULATORY_CARE_PROVIDER_SITE_OTHER): Payer: Medicare Other

## 2019-02-10 ENCOUNTER — Other Ambulatory Visit: Payer: Self-pay | Admitting: Gastroenterology

## 2019-02-10 DIAGNOSIS — Z1159 Encounter for screening for other viral diseases: Secondary | ICD-10-CM

## 2019-02-10 LAB — SARS CORONAVIRUS 2 (TAT 6-24 HRS): SARS Coronavirus 2: NEGATIVE

## 2019-02-14 ENCOUNTER — Other Ambulatory Visit: Payer: Self-pay

## 2019-02-14 ENCOUNTER — Ambulatory Visit (AMBULATORY_SURGERY_CENTER): Payer: Medicare Other | Admitting: Gastroenterology

## 2019-02-14 ENCOUNTER — Encounter: Payer: Self-pay | Admitting: Gastroenterology

## 2019-02-14 VITALS — BP 138/83 | HR 64 | Temp 98.3°F | Resp 18 | Ht 74.0 in | Wt 278.0 lb

## 2019-02-14 DIAGNOSIS — Z7901 Long term (current) use of anticoagulants: Secondary | ICD-10-CM | POA: Diagnosis not present

## 2019-02-14 DIAGNOSIS — I2510 Atherosclerotic heart disease of native coronary artery without angina pectoris: Secondary | ICD-10-CM | POA: Diagnosis not present

## 2019-02-14 DIAGNOSIS — Z86010 Personal history of colonic polyps: Secondary | ICD-10-CM

## 2019-02-14 DIAGNOSIS — D123 Benign neoplasm of transverse colon: Secondary | ICD-10-CM

## 2019-02-14 DIAGNOSIS — I4891 Unspecified atrial fibrillation: Secondary | ICD-10-CM | POA: Diagnosis not present

## 2019-02-14 DIAGNOSIS — D122 Benign neoplasm of ascending colon: Secondary | ICD-10-CM | POA: Diagnosis not present

## 2019-02-14 DIAGNOSIS — K5900 Constipation, unspecified: Secondary | ICD-10-CM | POA: Diagnosis not present

## 2019-02-14 MED ORDER — SODIUM CHLORIDE 0.9 % IV SOLN: 500.0000 mL | Freq: Once | INTRAVENOUS | Status: DC

## 2019-02-14 NOTE — Progress Notes (Signed)
Report given to PACU, vss 

## 2019-02-14 NOTE — Progress Notes (Signed)
Pt's states no medical or surgical changes since previsit or office visit.  LC temps, JG iv and CW vitals.

## 2019-02-14 NOTE — Patient Instructions (Signed)
Handouts given;  Polyps, diverticulosis, hemorrhoids Resume Coumadin tonight Await pathology results  Resume previous diet Continue current medications      YOU HAD AN ENDOSCOPIC PROCEDURE TODAY AT [place]:   Refer to the procedure report that was given to you for any specific questions about what was found during the examination.  If the procedure report does not answer your questions, please call your gastroenterologist to clarify.  If you requested that your care partner not be given the details of your procedure findings, then the procedure report has been included in a sealed envelope for you to review at your convenience later.  YOU SHOULD EXPECT: Some feelings of bloating in the abdomen. Passage of more gas than usual.  Walking can help get rid of the air that was put into your GI tract during the procedure and reduce the bloating. If you had a lower endoscopy (such as a colonoscopy or flexible sigmoidoscopy) you may notice spotting of blood in your stool or on the toilet paper. If you underwent a bowel prep for your procedure, you may not have a normal bowel movement for a few days.  Please Note:  You might notice some irritation and congestion in your nose or some drainage.  This is from the oxygen used during your procedure.  There is no need for concern and it should clear up in a day or so.  SYMPTOMS TO REPORT IMMEDIATELY:   Following lower endoscopy (colonoscopy or flexible sigmoidoscopy):  Excessive amounts of blood in the stool  Significant tenderness or worsening of abdominal pains  Swelling of the abdomen that is new, acute  Fever of 100F or higher   For urgent or emergent issues, a gastroenterologist can be reached at any hour by calling [phone].   DIET:  We do recommend a small meal at first, but then you may proceed to your regular diet.  Drink plenty of fluids but you should avoid alcoholic beverages for 24 hours.  ACTIVITY:  You should  plan to take it easy for the rest of today and you should NOT DRIVE or use heavy machinery until tomorrow (because of the sedation medicines used during the test).    FOLLOW UP: Our staff will call the number listed on your records 48-72 hours following your procedure to check on you and address any questions or concerns that you may have regarding the information given to you following your procedure. If we do not reach you, we will leave a message.  We will attempt to reach you two times.  During this call, we will ask if you have developed any symptoms of COVID 19. If you develop any symptoms (ie: fever, flu-like symptoms, shortness of breath, cough etc.) before then, please call [phone].  If you test positive for Covid 19 in the 2 weeks post procedure, please call and report this information to [place].    If any biopsies were taken you will be contacted by phone or by letter within the next 1-3 weeks.  Please call us at [phone] if you have not heard about the biopsies in 3 weeks.    SIGNATURES/CONFIDENTIALITY: You and/or your care partner have signed paperwork which will be entered into your electronic medical record.  These signatures attest to the fact that that the information above on your After Visit Summary has been reviewed and is understood.  Full responsibility of the confidentiality of this discharge information lies with you and/or your care-partner.

## 2019-02-14 NOTE — Op Note (Addendum)
[place] Patient Name: [name] [name] Procedure Date: 02/14/2019 3:10 PM MRN: [id number] Endoscopist: Remo Lipps P. Havery Moros , MD Age: 73 Referring MD:  Date of Birth: [birth year] Gender: Male Account #: [id number] Procedure:                Colonoscopy Indications:              Surveillance: Personal history of adenomatous                            polyps on last colonoscopy 3 years ago, that exam                            was limited by fair prep, double prep used for this                            exam Medicines:                Monitored Anesthesia Care Procedure:                Pre-Anesthesia Assessment:                           - Prior to the procedure, a History and Physical                            was performed, and patient medications and                            allergies were reviewed. The patient's tolerance of                            previous anesthesia was also reviewed. The risks                            and benefits of the procedure and the sedation                            options and risks were discussed with the patient.                            All questions were answered, and informed consent                            was obtained. Prior Anticoagulants: The patient has                            taken Coumadin (warfarin), last dose was 5 days                            prior to procedure. ASA Grade Assessment: III - A                            patient with severe systemic disease. After  reviewing the risks and benefits, the patient was                            deemed in satisfactory condition to undergo the                            procedure.                           After obtaining informed consent, the colonoscope                            was passed under direct vision. Throughout the                            procedure, the patient's blood pressure, pulse, and                            oxygen  saturations were monitored continuously. The                            Colonoscope was introduced through the anus and                            advanced to the the cecum, identified by                            appendiceal orifice and ileocecal valve. The                            colonoscopy was technically difficult and complex                            due to unsatisfactory bowel prep. The patient                            tolerated the procedure well. The quality of the                            bowel preparation was unsatisfactory. The ileocecal                            valve, appendiceal orifice, and rectum were                            photographed. Findings:                 The perianal and digital rectal examinations were                            normal.                           A large amount of semi-liquid stool was found in  the entire colon, making visualization difficult.                            Lavage of the colon was performed using copious                            amounts of sterile water, resulting in incomplete                            clearance with fair visualization. Worst afffected                            area was the cecum, splenic flexure, and left                            colon, but residual stool noted throughout                            including seeds / nuts. Attempts at lavage limited                            by clogging of the colonoscope.                           Three sessile polyps were found in the ascending                            colon. The polyps were 3 to 5 mm in size. These                            polyps were removed with a cold snare. Resection                            and retrieval were complete.                           Two sessile polyps were found in the hepatic                            flexure. The polyps were 3 to 4 mm in size. These                            polyps were  removed with a cold snare. Resection                            and retrieval were complete.                           A 3 mm polyp was found in the transverse colon. The                            polyp was sessile. The polyp was removed with a  cold snare. Resection and retrieval were complete.                           A few small-mouthed diverticula were found in the                            sigmoid colon.                           Internal hemorrhoids were found during retroflexion.                           The exam was otherwise without abnormality of what                            was visualized, but other polyps may not have been                            appreciated due to bowel prep. Complications:            No immediate complications. Estimated blood loss:                            Minimal. Estimated Blood Loss:     Estimated blood loss was minimal. Impression:               - Preparation of the colon was unsatisfactory.                           - Stool in the entire examined colon despite double                            prep.                           - Three 3 to 5 mm polyps in the ascending colon,                            removed with a cold snare. Resected and retrieved.                           - Two 3 to 4 mm polyps at the hepatic flexure,                            removed with a cold snare. Resected and retrieved.                           - One 3 mm polyp in the transverse colon, removed                            with a cold snare. Resected and retrieved.                           - Diverticulosis in the sigmoid colon.                           -  Internal hemorrhoids.                           - The examination was otherwise normal. Recommendation:           - Patient has a contact number available for                            emergencies. The signs and symptoms of potential                            delayed complications were  discussed with the                            patient. Return to normal activities tomorrow.                            Written discharge instructions were provided to the                            patient.                           - Resume previous diet.                           - Continue present medications.                           - Resume coumadin tonight                           - Await pathology results.                           - Will discuss options with patient about further                            surveillance given prep issues on this exam and                            exams in the past, but multiple polyps removed on                            both exams. Remo Lipps P. Rakiyah Esch, MD 02/14/2019 3:19:27 PM This report has been signed electronically.

## 2019-02-14 NOTE — Progress Notes (Signed)
Called to room to assist during endoscopic procedure.  Patient ID and intended procedure confirmed with present staff. Received instructions for my participation in the procedure from the performing physician.  

## 2019-02-16 ENCOUNTER — Telehealth: Payer: Self-pay

## 2019-02-16 NOTE — Telephone Encounter (Signed)
  Follow up Call-  Call back number 02/14/2019  Post procedure Call Back phone  # [phone]  Permission to leave phone message Yes  Some recent data might be hidden     Patient questions:  Do you have a fever, pain , or abdominal swelling? No. Pain Score  0 *  Have you tolerated food without any problems? Yes.    Have you been able to return to your normal activities? Yes.    Do you have any questions about your discharge instructions: Diet   No. Medications  No. Follow up visit  No.  Do you have questions or concerns about your Care? No.  Actions: * If pain score is 4 or above: No action needed, pain <4.  1. Have you developed a fever since your procedure? no  2.   Have you had an respiratory symptoms (SOB or cough) since your procedure? no  3.   Have you tested positive for COVID 19 since your procedure no  4.   Have you had any family members/close contacts diagnosed with the COVID 19 since your procedure?  no   If yes to any of these questions please route to Joylene Jmarion, RN and Alphonsa Gin, Therapist, sports.

## 2019-02-17 ENCOUNTER — Telehealth: Payer: Self-pay | Admitting: Pulmonary Disease

## 2019-02-17 NOTE — Telephone Encounter (Signed)
Called and advised the patient of the results. Patient voiced understanding. Nothing further needed at this time.

## 2019-02-17 NOTE — Telephone Encounter (Signed)
CPST 01/30/19 >> circulatory limiting factors such as diastolic heart failure    Please let him know that the exercise test shows limitation in activity seems related to his heart function.  I have forwarded the report to his cardiologist, Dr. Percival Spanish, for review.

## 2019-02-20 ENCOUNTER — Telehealth: Payer: Self-pay | Admitting: Cardiology

## 2019-02-20 NOTE — Telephone Encounter (Signed)
Spoke with pt and pt's wife  Per wife wants appt with Dr Percival Spanish Appt made for this Friday 12/25/18 at 10:20 am Per pt wife seems exhausted after trying to unload groceries  Spoke with pt as well and pt  has concerns re CPX findings that was done on 01/30/19 Will forward to Dr Percival Spanish for review .Adonis Housekeeper

## 2019-02-20 NOTE — Telephone Encounter (Signed)
Pt c/o Shortness Of Breath: STAT if SOB developed within the last 24 hours or pt is noticeably SOB on the phone  1. Are you currently SOB (can you hear that pt is SOB on the phone)? yes  2. How long have you been experiencing SOB? A while  3. Are you SOB when sitting or when up moving around? Up moving around  4. Are you currently experiencing any other symptoms? More tired than usual and more out of breath than usual. Cannot lift groceries to put them away. I scheduled patient to see Dr. Percival Spanish at 10:20 on 12/18.

## 2019-02-23 NOTE — Progress Notes (Signed)
Cardiology Office Note   Date:  02/24/2019   ID:  [name] [name], DOB Dec 05, 1945, MRN [id number]  PCP:  Binnie Rail, MD  Cardiologist:   Minus Breeding, MD   Chief Complaint  Patient presents with  . Shortness of Breath      History of Present Illness: [name] [name] is a 73 y.o. male who presents for of CAD s/p PCI/DES to LAD 07/2013, atrial fibrillations/pablation at the [facility] 2016but remainson coumadin due to ongoing paroxysmal atrial fibrillation, hypertension, AAA, DVT with PE(provoked following surgery ~74yr ago), along with OSA and COPD followed by Dr.Sood.He had DOE and increased HR. He had a cardiac cath In June.    He traveled back up to [place] where he saw his cardiologist up there.  Because he is monitor that he wore to you earlier this year demonstrated some brief runs of SVT and could not exclude atrial fibrillation he wanted to be seen.  I did not think he had any episodes of fibrillation but because of ongoing symptoms I suggested to him that he might need stress echocardiography.   I spoke with the cardiologist and followed up with a dobutamine stress echo to look for an inducible gradient across the aortic outflow tract, SAM and elevated pulmonary pressures.   There was no clear evidence of SAM.  Unfortunately the pulmonary pressures were not able to be measured.  However, there was an increased ventricular gradient with dobutamine that was 100 mm Hg although it was not entirely clear whether the gradient was subvalvular or mid cavity.   I subsequently increased his verapamil.  He did not have improvement and I sent him to Dr. Halford Chessman for a pulmonary evaluation. He had a CPX which suggested that his limitation was possibly weight and deconditioning but could also be related to diastolic dysfunction.    He comes back and he says he is just very breathless when he walks.  We walked him around the office today and he was very subjectively short of  breath and his oxygen saturation fell to 89%.  I went over the CPX with him and reviewed the fact that there clearly was some component of deconditioning and weight.  But there was objectively a very high blood pressure response.  Given the absence of any other treatable reversible issues we had a long discussion and very frankly about his need to lose weight.  He said maybe I should stop eating the cookies and chips.  He needs to have a significant weight loss to see if he has improvement.  In the meantime I will try very hard to bring his pressure down further and prevent the hypertensive response which might exacerbate diastolic dysfunction.  Going to switch him from verapamil to Calan SR 120.    Past Medical History:  Diagnosis Date  . Arthritis   . Asthma   . BPH (benign prostatic hypertrophy)   . CAD (coronary artery disease)    a. 07/2013: s/p DES to LAD, normal LVF.  [name] Complication of anesthesia    "I got all kinds of hallucinations"  . COPD (chronic obstructive pulmonary disease) ([place])    a. 07/2013 PFT's mild airflow obstruction, no restriction, sev decrease in DLCO.  . Diabetes mellitus without complication ([place])   . Diverticulosis   . DVT (deep venous thrombosis) ([place])    a. 2010 Lower ext s/p back surgery.  [name] Dyspnea on exertion    a. 07/2013 PFT's mild airflow obstr   .  GERD (gastroesophageal reflux disease)   . History of gout   . History of hiatal hernia   . Hx of echocardiogram 2015   Echo (06/2013): EF 60-65% normal wall motion, normal diastolic function, aortic sclerosis without stenosis, Trivial MR, mild SAM due to long, redundant mitral leaflets, mild RAE, normal RVSF  . Hypertension   . Macrocytic anemia    a. 07/2013: documented on prior labs.  . Obesity   . OSA (obstructive sleep apnea) 11/14/2015  . PAF (paroxysmal atrial fibrillation) ([place])    a. Flecainide discontinued 07/2013 in setting of CAD.; b. s/p PVI Ablation at [facility] (Dr Joseph Berkshire) 11/2014  . Pneumonia  ~ 2010 X 1  . Pulmonary embolism ([place])    a. 2010 in setting of DVT post-op back surgery. b. Low prob VQ 07/2013.  [name] Sleep apnea    on cpap  . Spinal stenosis    Congential  . Transaminitis    a. 07/2013: mild.  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 ANTERIOR LUMBAR [place] ARTHROPLASTY  03/2008   "spacer poped out; had to repair"  . CATARACT EXTRACTION W/ INTRAOCULAR LENS  IMPLANT, BILATERAL Bilateral   . COLONOSCOPY    . COLONOSCOPY W/ POLYPECTOMY  2004  . Colonoscopy with polypectomy  09/2011   2 tubular adenomas  . CORONARY ANGIOPLASTY WITH STENT PLACEMENT  08/08/2013   "1"  . JOINT REPLACEMENT    . LEFT AND RIGHT HEART CATHETERIZATION WITH CORONARY ANGIOGRAM N/A 08/07/2013   Procedure: LEFT AND RIGHT HEART CATHETERIZATION WITH CORONARY ANGIOGRAM;  Surgeon: Peter M Martinique, MD;  Location: [facility] CATH LAB;  Service: Cardiovascular;  Laterality: N/A;  . LUMBAR FUSION  02/2008  . PERCUTANEOUS CORONARY STENT INTERVENTION (PCI-S)  08/07/2013   Procedure: PERCUTANEOUS CORONARY STENT INTERVENTION (PCI-S);  Surgeon: Peter M Martinique, MD;  Location: [facility] CATH LAB;  Service: Cardiovascular;;  . PVI ablation  11/2014   Dr. Venita Sheffield [facility]  . RIGHT/LEFT HEART CATH AND CORONARY ANGIOGRAPHY N/A 08/15/2018   Procedure: RIGHT/LEFT HEART CATH AND CORONARY ANGIOGRAPHY;  Surgeon: Nelva Bush, MD;  Location: [place] CV LAB;  Service: Cardiovascular;  Laterality: N/A;  . TOTAL HIP ARTHROPLASTY Bilateral   . VASECTOMY       Current Outpatient Medications  Medication Sig Dispense Refill  . albuterol (VENTOLIN HFA) 108 (90 Base) MCG/ACT inhaler Inhale 2 puffs into the lungs every 6 (six) hours as needed for wheezing or shortness of breath. 6.7 g 1  . aspirin 81 MG chewable tablet Chew 81 mg by mouth daily.     [name] atorvastatin (LIPITOR) 10 MG tablet Take 10 mg by mouth daily.    . Cholecalciferol (VITAMIN D3 PO) Take 1 capsule by mouth  daily.    . metFORMIN (GLUCOPHAGE) 500 MG tablet Take 1 tablet (500 mg total) by mouth 2 (two) times daily with a meal. 180 tablet 0  . metoprolol tartrate (LOPRESSOR) 50 MG tablet Take 2 tablets (100 mg total) by mouth 2 (two) times daily. 180 tablet 1  . nitroGLYCERIN (NITROSTAT) 0.4 MG SL tablet Place 1 tablet (0.4 mg total) under the tongue every 5 (five) minutes as needed for chest pain (up to 3 doses). 25 tablet 4  . pantoprazole (PROTONIX) 40 MG tablet TAKE 1 TABLET DAILY 90 tablet 3  . venlafaxine XR (EFFEXOR-XR) 75 MG 24 hr capsule Take 75 mg by mouth daily with breakfast.    . vitamin B-12 (CYANOCOBALAMIN)  1000 MCG tablet Take 1,000 mcg by mouth daily.    [name] warfarin (COUMADIN) 3 MG tablet TAKE 2 TABLETS DAILY OR AS DIRECTED BY COUMADIN CLINIC 180 tablet 3  . verapamil (CALAN-SR) 120 MG CR tablet Take 1 tablet (120 mg total) by mouth at bedtime. 90 tablet 3   No current facility-administered medications for this visit.    Allergies:   Penicillins     ROS:  Please see the history of present illness.   Otherwise, review of systems are positive none.   All other systems are reviewed and negative.    PHYSICAL EXAM: VS:  BP (!) 132/58   Pulse 75   Ht 6\' 2"  (1.88 m)   Wt 278 lb (126.1 kg)   SpO2 96%   BMI 35.69 kg/m  , BMI Body mass index is 35.69 kg/m. GENERAL:  Well appearing NECK:  No jugular venous distention, waveform within normal limits, carotid upstroke brisk and symmetric, no bruits, no thyromegaly LUNGS:  Clear to auscultation bilaterally CHEST:  Unremarkable HEART:  PMI not displaced or sustained,S1 and S2 within normal limits, no S3, no S4, no clicks, no rubs, no murmurs ABD:  Flat, positive bowel sounds normal in frequency in pitch, no bruits, no rebound, no guarding, no midline pulsatile mass, no hepatomegaly, no splenomegaly EXT:  2 plus pulses throughout, no edema, no cyanosis no clubbing   EKG:  EKG is not ordered today.    Recent Labs: 09/19/2018: ALT  28; BUN 16; Creatinine, Ser 1.09; Hemoglobin 11.2; Platelets 497.0; Potassium 4.8; Sodium 139    Lipid Panel    Component Value Date/Time   CHOL 111 09/19/2018 1442   TRIG 179.0 (H) 09/19/2018 1442   HDL 36.10 (L) 09/19/2018 1442   CHOLHDL 3 09/19/2018 1442   VLDL 35.8 09/19/2018 1442   LDLCALC 39 09/19/2018 1442      Wt Readings from Last 3 Encounters:  02/24/19 278 lb (126.1 kg)  02/14/19 278 lb (126.1 kg)  01/24/19 278 lb 3.2 oz (126.2 kg)      Other studies Reviewed: Additional studies/ records that were reviewed today include: CPX, pulmonary noteds Review of the above records demonstrates:  Please see elsewhere in the note.     ASSESSMENT AND PLAN:  ATRIAL FIBRILLATION:Mr.Arseniy S McCarthyhas a CHA2DS2 - VASc score of 3.     No change in therapy other than listed below.   DYSPNEA: Clearly weight and deconditioning plays a role.  Its not clear that functional mitral regurgitation is exacerbated.  There clearly is also some role of diastolic dysfunction and hypertensive blood pressure response and I am going to aggressively manage that by increasing his verapamil.   [id number] is 3.1 cm.  No change in therapy.   CAD:  The patient has no new sypmtoms.  No further cardiovascular testing is indicated.  We will continue with aggressive risk reduction and meds as listed.  HTN:   This will be managed as above.   MR:  He had no inducible SAM with dobutamine.  As above.   SLEEP APNEA:    This is followed by Dr. Halford Chessman.   Current medicines are reviewed at length with the patient today.  The patient does not have concerns regarding medicines.  The following changes have been made:  As above.   Labs/ tests ordered today include: None  No orders of the defined types were placed in this encounter.    Disposition:   FU with me in 2 months.  Signed, Minus Breeding, MD  02/24/2019 11:59 AM    [place]

## 2019-02-24 ENCOUNTER — Other Ambulatory Visit: Payer: Self-pay

## 2019-02-24 ENCOUNTER — Encounter: Payer: Self-pay | Admitting: Cardiology

## 2019-02-24 ENCOUNTER — Ambulatory Visit (INDEPENDENT_AMBULATORY_CARE_PROVIDER_SITE_OTHER): Payer: Medicare Other | Admitting: Cardiology

## 2019-02-24 VITALS — BP 132/58 | HR 75 | Ht 74.0 in | Wt 278.0 lb

## 2019-02-24 DIAGNOSIS — R0600 Dyspnea, unspecified: Secondary | ICD-10-CM

## 2019-02-24 DIAGNOSIS — I2510 Atherosclerotic heart disease of native coronary artery without angina pectoris: Secondary | ICD-10-CM

## 2019-02-24 DIAGNOSIS — I200 Unstable angina: Secondary | ICD-10-CM

## 2019-02-24 DIAGNOSIS — I714 Abdominal aortic aneurysm, without rupture, unspecified: Secondary | ICD-10-CM

## 2019-02-24 DIAGNOSIS — Z7189 Other specified counseling: Secondary | ICD-10-CM

## 2019-02-24 DIAGNOSIS — I10 Essential (primary) hypertension: Secondary | ICD-10-CM

## 2019-02-24 DIAGNOSIS — I4891 Unspecified atrial fibrillation: Secondary | ICD-10-CM

## 2019-02-24 MED ORDER — VERAPAMIL HCL ER 120 MG PO TBCR: 120.0000 mg | EXTENDED_RELEASE_TABLET | Freq: Every day | ORAL | 3 refills | Status: DC

## 2019-02-24 NOTE — Patient Instructions (Addendum)
Medication Instructions:  Your physician has recommended you make the following change in your medication:   STOP TAKING VERAPAMIL (CALAN) 80 MG.  START TAKING verapamil (CALAN-SR) 120 MG CR. ONE TABLET BY MOUTH EVERY EVENING   *If you need a refill on your cardiac medications before your next appointment, please call your pharmacy*  Lab Work: NONE If you have labs (blood work) drawn today and your tests are completely normal, you will receive your results only by: [name] MyChart Message (if you have MyChart) OR . A paper copy in the mail If you have any lab test that is abnormal or we need to change your treatment, we will call you to review the results.  Testing/Procedures: NONE  Follow-Up: At [facility], you and your health needs are our priority.  As part of our continuing mission to provide you with exceptional heart care, we have created designated Provider Care Teams.  These Care Teams include your primary Cardiologist (physician) and Advanced Practice Providers (APPs -  Physician Assistants and Nurse Practitioners) who all work together to provide you with the care you need, when you need it.  Your next appointment:   2 month(s)  The format for your next appointment:   Either In Person or Virtual  Provider:   Minus Breeding, MD

## 2019-03-13 ENCOUNTER — Ambulatory Visit (INDEPENDENT_AMBULATORY_CARE_PROVIDER_SITE_OTHER): Payer: Medicare Other | Admitting: Pharmacist Clinician (PhC)/ Clinical Pharmacy Specialist

## 2019-03-13 ENCOUNTER — Other Ambulatory Visit: Payer: Self-pay

## 2019-03-13 DIAGNOSIS — Z86718 Personal history of other venous thrombosis and embolism: Secondary | ICD-10-CM

## 2019-03-13 DIAGNOSIS — Z7901 Long term (current) use of anticoagulants: Secondary | ICD-10-CM

## 2019-03-13 DIAGNOSIS — I480 Paroxysmal atrial fibrillation: Secondary | ICD-10-CM

## 2019-03-13 LAB — POCT INR: INR: 2.1 (ref 2.0–3.0)

## 2019-03-16 DIAGNOSIS — M5136 Other intervertebral disc degeneration, lumbar region: Secondary | ICD-10-CM | POA: Diagnosis not present

## 2019-03-16 DIAGNOSIS — M961 Postlaminectomy syndrome, not elsewhere classified: Secondary | ICD-10-CM | POA: Diagnosis not present

## 2019-03-22 ENCOUNTER — Encounter: Payer: Self-pay | Admitting: Internal Medicine

## 2019-03-22 ENCOUNTER — Ambulatory Visit (INDEPENDENT_AMBULATORY_CARE_PROVIDER_SITE_OTHER): Payer: Medicare Other | Admitting: Internal Medicine

## 2019-03-22 ENCOUNTER — Other Ambulatory Visit: Payer: Self-pay

## 2019-03-22 VITALS — BP 132/68 | HR 62 | Temp 98.4°F | Resp 16 | Ht 75.0 in | Wt 266.7 lb

## 2019-03-22 DIAGNOSIS — I480 Paroxysmal atrial fibrillation: Secondary | ICD-10-CM | POA: Diagnosis not present

## 2019-03-22 DIAGNOSIS — G4709 Other insomnia: Secondary | ICD-10-CM | POA: Diagnosis not present

## 2019-03-22 DIAGNOSIS — R42 Dizziness and giddiness: Secondary | ICD-10-CM | POA: Diagnosis not present

## 2019-03-22 DIAGNOSIS — I2510 Atherosclerotic heart disease of native coronary artery without angina pectoris: Secondary | ICD-10-CM

## 2019-03-22 DIAGNOSIS — K219 Gastro-esophageal reflux disease without esophagitis: Secondary | ICD-10-CM | POA: Diagnosis not present

## 2019-03-22 DIAGNOSIS — I10 Essential (primary) hypertension: Secondary | ICD-10-CM

## 2019-03-22 DIAGNOSIS — R7303 Prediabetes: Secondary | ICD-10-CM | POA: Diagnosis not present

## 2019-03-22 DIAGNOSIS — E7849 Other hyperlipidemia: Secondary | ICD-10-CM | POA: Diagnosis not present

## 2019-03-22 DIAGNOSIS — E669 Obesity, unspecified: Secondary | ICD-10-CM

## 2019-03-22 LAB — COMPREHENSIVE METABOLIC PANEL
ALT: 28 U/L (ref 0–53)
AST: 28 U/L (ref 0–37)
Albumin: 4.3 g/dL (ref 3.5–5.2)
Alkaline Phosphatase: 79 U/L (ref 39–117)
BUN: 20 mg/dL (ref 6–23)
CO2: 24 mEq/L (ref 19–32)
Calcium: 9.3 mg/dL (ref 8.4–10.5)
Chloride: 107 mEq/L (ref 96–112)
Creatinine, Ser: 1.02 mg/dL (ref 0.40–1.50)
GFR: 71.2 mL/min (ref >60.00)
Glucose, Bld: 84 mg/dL (ref 70–99)
Potassium: 4.3 mEq/L (ref 3.5–5.1)
Sodium: 138 mEq/L (ref 135–145)
Total Bilirubin: 0.5 mg/dL (ref 0.2–1.2)
Total Protein: 7.5 g/dL (ref 6.0–8.3)

## 2019-03-22 LAB — CBC WITH DIFFERENTIAL/PLATELET
Basophils Absolute: 0.2 10*3/uL — ABNORMAL HIGH (ref 0.0–0.1)
Basophils Relative: 1.8 % (ref 0.0–3.0)
Eosinophils Absolute: 1 10*3/uL — ABNORMAL HIGH (ref 0.0–0.7)
Eosinophils Relative: 10.7 % — ABNORMAL HIGH (ref 0.0–5.0)
HCT: 32.3 % — ABNORMAL LOW (ref 39.0–52.0)
Hemoglobin: 10.7 g/dL — ABNORMAL LOW (ref 13.0–17.0)
Lymphocytes Relative: 30 % (ref 12.0–46.0)
Lymphs Abs: 2.7 10*3/uL (ref 0.7–4.0)
MCHC: 33.1 g/dL (ref 30.0–36.0)
MCV: 107.6 fl — ABNORMAL HIGH (ref 78.0–100.0)
Monocytes Absolute: 1.2 10*3/uL — ABNORMAL HIGH (ref 0.1–1.0)
Monocytes Relative: 13.7 % — ABNORMAL HIGH (ref 3.0–12.0)
Neutro Abs: 4 10*3/uL (ref 1.4–7.7)
Neutrophils Relative %: 43.8 % (ref 43.0–77.0)
Platelets: 489 10*3/uL — ABNORMAL HIGH (ref 150.0–400.0)
RBC: 3 Mil/uL — ABNORMAL LOW (ref 4.22–5.81)
RDW: 15.9 % — ABNORMAL HIGH (ref 11.5–15.5)
WBC: 9.1 10*3/uL (ref 4.0–10.5)

## 2019-03-22 LAB — LIPID PANEL
Cholesterol: 96 mg/dL (ref 0–200)
HDL: 32.5 mg/dL — ABNORMAL LOW (ref >39.00)
LDL Cholesterol: 40 mg/dL (ref 0–99)
NonHDL: 63.3
Total CHOL/HDL Ratio: 3
Triglycerides: 118 mg/dL (ref 0.0–149.0)
VLDL: 23.6 mg/dL (ref 0.0–40.0)

## 2019-03-22 LAB — TSH: TSH: 2.01 u[IU]/mL (ref 0.35–4.50)

## 2019-03-22 MED ORDER — METFORMIN HCL 500 MG PO TABS: 500.0000 mg | ORAL_TABLET | Freq: Two times a day (BID) | ORAL | 1 refills | Status: DC

## 2019-03-22 NOTE — Assessment & Plan Note (Signed)
Chronic lightheadedness/dizziness that is exertional.  Started after he had his cardiac ablation Feels a daily and multiple times a day whenever he gets up and moves.  Improves when he goes to rest Cardiology increased his blood pressure medication and stressed weight loss He barely drinks any water throughout the day-advised increasing water significantly Does not sound to be vestibular in nature given that it is exertional-see if the increased water intake helps He is working on weight loss Encouraged him to try to increase his exercise/activity

## 2019-03-22 NOTE — Patient Instructions (Addendum)
  Blood work was ordered.     Medications reviewed and updated.  Changes include :   None   Your prescription(s) have been submitted to your pharmacy. Please take as directed and contact our office if you believe you are having problem(s) with the medication(s).     Please followup in 6 months   

## 2019-03-22 NOTE — Assessment & Plan Note (Signed)
Chronic BP well controlled Current regimen effective and well tolerated Continue current medications at current doses CMP 

## 2019-03-22 NOTE — Assessment & Plan Note (Signed)
Chronic Check a1c Low sugar / carb diet Stressed regular exercise  

## 2019-03-22 NOTE — Assessment & Plan Note (Signed)
He is actively weight working on weight loss.  He is stopped eating junk food and is eating 2 meals a day He is not able to exercise regularly, but advised him to try his floor bike again or increase any activity that is tolerated Continue to decrease portions Advised there is no quick easy fix-there is no medication that he can take will help with weight loss

## 2019-03-22 NOTE — Assessment & Plan Note (Signed)
Taking Lunesta nightly-still often takes some 2 hours to get to sleep because he wears CPAP and the noise bothers him.  He has music gone to cover up the noise from the CPAP. Discussed that he can try taking Lunesta earlier-currently taking it just before he goes to bed Can consider trying a different medication if he wants-he will let me know

## 2019-03-22 NOTE — Progress Notes (Signed)
Subjective:    Patient ID: [name] [name], male    DOB: 28-Oct-1945, 73 y.o.   MRN: [id number]  HPI The patient is here for follow up of their chronic medical problems, including COPD, OSA, prediabetes, CAD, htn, Afib, GERD, hyperlipidemia, insomnia.   He is taking all of his medications as prescribed.    He is not exercising regularly.      Lightheadedness/dizziness:  When he moves one place to another.  Ongoing for years.  Has seen cardiology and pulmonary for this.  According to lung tests it is a heart problem.  Cardiology has given him more BP medication and stress weight loss.  It feels like afib to him. He can not do anything - he feels this multiple times a day.  h can not walk around [employer].  As soon as he stops and rests it is gone.  It is exertional.  Walking 50 years he needs to stop 2-3 times.  He really noticed this after he had the ablation.  He states he drinks a couple of cups of coffee and soda during the day.  He drinks some water when he takes his pills.  He denies any association with the dizziness/lightheadedness with head movements.  It is purely exertional.  He has lost weight.    He is not able to exercise and wonders if there is anything that can help.  He has cut down on junk food and is not eating lunch.  He snacks on carrots.    It takes two hours to get to sleep.  He wears his cpap and the noise bothers him.  He has to plays music to cover up the noise of the machine.  Once he is asleep he is ok.    Medications and allergies reviewed with patient and updated if appropriate.  Patient Active Problem List   Diagnosis Date Noted   Chronic diastolic HF (heart failure) ([place]) 12/05/2018   Accelerating angina ([place]) 08/15/2018   Olecranon bursitis of right elbow 07/25/2018   Non-rheumatic mitral regurgitation 04/16/2017   Bilateral leg edema 12/26/2016   Anxiety 12/26/2016   Scar tissue 11/02/2016   Plantar fasciitis 10/06/2016   Vitamin D deficiency  07/21/2016   OSA (obstructive sleep apnea) 11/14/2015   Prediabetes 04/17/2015   COPD GOLD II 08/21/2014   Hyperlipidemia 08/20/2014   Spinal stenosis of lumbar region 05/30/2014   Abnormal CT of the abdomen 05/30/2014   Dizziness 02/08/2014   Macrocytic anemia 08/08/2013   CAD (coronary artery disease)    AAA (abdominal aortic aneurysm) without rupture ([place]) 05/02/2013   Obesity (BMI 30-39.9) 03/21/2013   Family history of malignant neoplasm of gastrointestinal tract 08/25/2011   Ulcerative proctitis ([place]) 08/25/2011   Fatigue 03/24/2011   DOE (dyspnea on exertion) 03/12/2011   Warfarin anticoagulation 03/11/2011   INSOMNIA-SLEEP DISORDER-UNSPEC 09/12/2009   CERVICAL RADICULOPATHY, RIGHT 07/24/2009   AVASCULAR NECROSIS 05/21/2009   ALLERGIC RHINITIS CAUSE UNSPECIFIED 11/01/2008   PULMONARY NODULE, RIGHT LOWER LOBE 10/25/2008   DIVERTICULOSIS, COLON 10/07/2008   Hypertrophic obstructive cardiomyopathy ([place]) 10/05/2008   PULMONARY EMBOLISM, HX OF 10/05/2008   ASTHMA 02/20/2008   HYPERPLASIA PROSTATE UNS W/UR OBST & OTH LUTS 06/28/2007   PAF (paroxysmal atrial fibrillation) ([place]) 06/25/2007   COLONIC POLYPS 04/01/2006   GOUT 04/01/2006   Essential hypertension 04/01/2006   ACID REFLUX DISEASE 04/01/2006    Current Outpatient Medications on File Prior to Visit  Medication Sig Dispense Refill   albuterol (VENTOLIN HFA) 108 (90 Base)  MCG/ACT inhaler Inhale 2 puffs into the lungs every 6 (six) hours as needed for wheezing or shortness of breath. 6.7 g 1   aspirin 81 MG chewable tablet Chew 81 mg by mouth daily.      atorvastatin (LIPITOR) 10 MG tablet Take 10 mg by mouth daily.     Cholecalciferol (VITAMIN D3 PO) Take 1 capsule by mouth daily.     Eszopiclone 3 MG TABS Take 3 mg by mouth at bedtime. Take immediately before bedtime     metFORMIN (GLUCOPHAGE) 500 MG tablet Take 1 tablet (500 mg total) by mouth 2 (two) times daily with a meal. 180  tablet 0   metoprolol tartrate (LOPRESSOR) 50 MG tablet Take 2 tablets (100 mg total) by mouth 2 (two) times daily. 180 tablet 1   nitroGLYCERIN (NITROSTAT) 0.4 MG SL tablet Place 1 tablet (0.4 mg total) under the tongue every 5 (five) minutes as needed for chest pain (up to 3 doses). 25 tablet 4   pantoprazole (PROTONIX) 40 MG tablet TAKE 1 TABLET DAILY 90 tablet 3   venlafaxine XR (EFFEXOR-XR) 75 MG 24 hr capsule Take 75 mg by mouth daily with breakfast.     verapamil (CALAN-SR) 120 MG CR tablet Take 1 tablet (120 mg total) by mouth at bedtime. 90 tablet 3   vitamin B-12 (CYANOCOBALAMIN) 1000 MCG tablet Take 1,000 mcg by mouth daily.     warfarin (COUMADIN) 3 MG tablet TAKE 2 TABLETS DAILY OR AS DIRECTED BY COUMADIN CLINIC 180 tablet 3   No current facility-administered medications on file prior to visit.    Past Medical History:  Diagnosis Date   Arthritis    Asthma    BPH (benign prostatic hypertrophy)    CAD (coronary artery disease)    a. 07/2013: s/p DES to LAD, normal LVF.   Complication of anesthesia    "I got all kinds of hallucinations"   COPD (chronic obstructive pulmonary disease) ([place])    a. 07/2013 PFT's mild airflow obstruction, no restriction, sev decrease in DLCO.   Diabetes mellitus without complication ([place])    Diverticulosis    DVT (deep venous thrombosis) ([place])    a. 2010 Lower ext s/p back surgery.   Dyspnea on exertion    a. 07/2013 PFT's mild airflow obstr    GERD (gastroesophageal reflux disease)    History of gout    History of hiatal hernia    Hx of echocardiogram 2015   Echo (06/2013): EF 60-65% normal wall motion, normal diastolic function, aortic sclerosis without stenosis, Trivial MR, mild SAM due to long, redundant mitral leaflets, mild RAE, normal RVSF   Hypertension    Macrocytic anemia    a. 07/2013: documented on prior labs.   Obesity    OSA (obstructive sleep apnea) 11/14/2015   PAF (paroxysmal atrial fibrillation) ([place])     a. Flecainide discontinued 07/2013 in setting of CAD.; b. s/p PVI Ablation at [facility] (Dr Joseph Berkshire) 11/2014   Pneumonia ~ 2010 X 1   Pulmonary embolism ([place])    a. 2010 in setting of DVT post-op back surgery. b. Low prob VQ 07/2013.   Sleep apnea    on cpap   Spinal stenosis    [place]   Transaminitis    a. 07/2013: mild.   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    Past Surgical History:  Procedure Laterality Date   ANTERIOR LUMBAR [place] ARTHROPLASTY  03/2008   "spacer poped out; had to repair"   CATARACT EXTRACTION W/ INTRAOCULAR LENS  IMPLANT, BILATERAL Bilateral    COLONOSCOPY     COLONOSCOPY W/ POLYPECTOMY  2004   Colonoscopy with polypectomy  09/2011   2 tubular adenomas   CORONARY ANGIOPLASTY WITH STENT PLACEMENT  08/08/2013   "1"   JOINT REPLACEMENT     LEFT AND RIGHT HEART CATHETERIZATION WITH CORONARY ANGIOGRAM N/A 08/07/2013   Procedure: LEFT AND RIGHT HEART CATHETERIZATION WITH CORONARY ANGIOGRAM;  Surgeon: Peter M Martinique, MD;  Location: [facility] CATH LAB;  Service: Cardiovascular;  Laterality: N/A;   LUMBAR FUSION  02/2008   PERCUTANEOUS CORONARY STENT INTERVENTION (PCI-S)  08/07/2013   Procedure: PERCUTANEOUS CORONARY STENT INTERVENTION (PCI-S);  Surgeon: Peter M Martinique, MD;  Location: [facility] CATH LAB;  Service: Cardiovascular;;   PVI ablation  11/2014   Dr. Venita Sheffield [facility]   RIGHT/LEFT HEART CATH AND CORONARY ANGIOGRAPHY N/A 08/15/2018   Procedure: RIGHT/LEFT HEART CATH AND CORONARY ANGIOGRAPHY;  Surgeon: Nelva Bush, MD;  Location: [place] [facility] CV LAB;  Service: Cardiovascular;  Laterality: N/A;   TOTAL HIP ARTHROPLASTY Bilateral    VASECTOMY      Social History   Socioeconomic History   Marital status: Married    Spouse name: Not on file   Number of children: 4   Years of education: Not on file   Highest education level: Not on file  Occupational History   Occupation: Retired from [employer]: RETIRED  Tobacco Use    Smoking status: Former Smoker    Packs/day: 2.00    Years: 40.00    Pack years: 80.00    Types: Cigarettes    Quit date: 03/09/2000    Years since quitting: 19.0   Smokeless tobacco: Never Used   Tobacco comment: smoked 1964-2002, up to 3 ppd  Substance and Sexual Activity   Alcohol use: Yes    Alcohol/week: 2.0 - 3.0 standard drinks    Types: 2 - 3 Cans of beer per week    Comment: drinks beer several times weekly   Drug use: No   Sexual activity: Never    Comment: "sexually hx is none of your business" (02/08/2014)  Other Topics Concern   Not on file  Social History Narrative   Not on file   Social Determinants of Health   Financial Resource Strain:    Difficulty of Paying Living Expenses: Not on file  Food Insecurity:    Worried About [place] in the Last Year: Not on file   [employer] of Food in the Last Year: Not on file  Transportation Needs:    Lack of Transportation (Medical): Not on file   Lack of Transportation (Non-Medical): Not on file  Physical Activity:    Days of Exercise per Week: Not on file   Minutes of Exercise per Session: Not on file  Stress:    Feeling of Stress : Not on file  Social Connections:    Frequency of Communication with Friends and Family: Not on file   Frequency of Social Gatherings with Friends and Family: Not on file   Attends Religious Services: Not on file   Active Member of Clubs or Organizations: Not on file   Attends Archivist Meetings: Not on file   Marital Status: Not on file    Family History  Problem Relation Age of Onset   Heart attack Paternal Grandfather        Over 25   Atrial fibrillation Mother    COPD Mother    Heart  disease Father    Colon cancer Father    Heart attack Brother 28   Heart disease Brother    Alcohol abuse Brother 31       Died post liver transplant   Heart attack Paternal Uncle        Less than 55   Stroke Neg Hx    Rectal cancer Neg Hx     Stomach cancer Neg Hx    Esophageal cancer Neg Hx     Review of Systems  Constitutional: Negative for fever.  Respiratory: Positive for shortness of breath (constant). Negative for cough and wheezing.   Cardiovascular: Negative for chest pain, palpitations and leg swelling.  Neurological: Positive for dizziness and light-headedness. Negative for headaches.  Psychiatric/Behavioral: Negative for dysphoric mood. The patient is not nervous/anxious.        Objective:   Vitals:   03/22/19 1345  BP: 132/68  Pulse: 62  Resp: 16  Temp: 98.4 F (36.9 C)  SpO2: 97%   BP Readings from Last 3 Encounters:  03/22/19 132/68  02/24/19 (!) 132/58  02/14/19 138/83   Wt Readings from Last 3 Encounters:  03/22/19 266 lb 11.2 oz (121 kg)  02/24/19 278 lb (126.1 kg)  02/14/19 278 lb (126.1 kg)   Body mass index is 33.34 kg/m.   Physical Exam    Constitutional: Appears well-developed and well-nourished. No distress.  HENT:  Head: Normocephalic and atraumatic.  Neck: Neck supple. No tracheal deviation present. No thyromegaly present.  No cervical lymphadenopathy Cardiovascular: Normal rate, regular rhythm and normal heart sounds.  No murmur heard. No carotid bruit .  No edema Pulmonary/Chest: Effort normal and breath sounds normal. No respiratory distress. No has no wheezes. No rales.  Skin: Skin is warm and dry. Not diaphoretic.  Psychiatric: Normal mood and affect. Behavior is normal.      Assessment & Plan:    See Problem List for Assessment and Plan of chronic medical problems.    This visit occurred during the SARS-CoV-2 public health emergency.  Safety protocols were in place, including screening questions prior to the visit, additional usage of staff PPE, and extensive cleaning of exam room while observing appropriate contact time as indicated for disinfecting solutions.

## 2019-03-22 NOTE — Assessment & Plan Note (Signed)
Chronic Check lipid panel  Continue daily statin Regular exercise and healthy diet encouraged  

## 2019-03-22 NOTE — Assessment & Plan Note (Signed)
Chronic Stable No symptoms, has been in sinus rhythm Follows with cardiology here and also goes to [facility] clinic CBC, CMP

## 2019-03-22 NOTE — Assessment & Plan Note (Signed)
Chronic GERD controlled Continue daily medication  

## 2019-03-22 NOTE — Assessment & Plan Note (Signed)
Not experiencing any chest pain Has chronic shortness of breath with exertion and lightheadedness/dizziness with exertion Following with cardiology and no evidence of ischemia or A. fib causing his symptoms Working on weight loss

## 2019-03-23 ENCOUNTER — Other Ambulatory Visit: Payer: Self-pay | Admitting: Cardiology

## 2019-03-23 ENCOUNTER — Encounter: Payer: Self-pay | Admitting: Internal Medicine

## 2019-03-23 DIAGNOSIS — D649 Anemia, unspecified: Secondary | ICD-10-CM

## 2019-03-23 LAB — HEMOGLOBIN A1C: Hgb A1c MFr Bld: 6.1 % (ref 4.6–6.5)

## 2019-03-31 ENCOUNTER — Ambulatory Visit (INDEPENDENT_AMBULATORY_CARE_PROVIDER_SITE_OTHER): Payer: Medicare Other | Admitting: Family Medicine

## 2019-03-31 ENCOUNTER — Other Ambulatory Visit: Payer: Self-pay

## 2019-03-31 ENCOUNTER — Telehealth: Payer: Self-pay | Admitting: *Deleted

## 2019-03-31 ENCOUNTER — Encounter: Payer: Self-pay | Admitting: Family Medicine

## 2019-03-31 ENCOUNTER — Observation Stay (HOSPITAL_COMMUNITY): Payer: Medicare Other

## 2019-03-31 ENCOUNTER — Inpatient Hospital Stay (HOSPITAL_COMMUNITY)
Admission: AD | Admit: 2019-03-31 | Discharge: 2019-04-03 | DRG: 308 | Disposition: A | Discharge status: Home / Self Care | Payer: Medicare Other | Source: Ambulatory Visit | Attending: Internal Medicine | Admitting: Internal Medicine

## 2019-03-31 VITALS — BP 114/54 | HR 80 | Ht 74.0 in | Wt 246.0 lb

## 2019-03-31 DIAGNOSIS — Z961 Presence of intraocular lens: Secondary | ICD-10-CM | POA: Diagnosis present

## 2019-03-31 DIAGNOSIS — K219 Gastro-esophageal reflux disease without esophagitis: Secondary | ICD-10-CM | POA: Diagnosis present

## 2019-03-31 DIAGNOSIS — I480 Paroxysmal atrial fibrillation: Secondary | ICD-10-CM

## 2019-03-31 DIAGNOSIS — Z825 Family history of asthma and other chronic lower respiratory diseases: Secondary | ICD-10-CM

## 2019-03-31 DIAGNOSIS — M109 Gout, unspecified: Secondary | ICD-10-CM | POA: Diagnosis present

## 2019-03-31 DIAGNOSIS — Z6833 Body mass index (BMI) 33.0-33.9, adult: Secondary | ICD-10-CM

## 2019-03-31 DIAGNOSIS — I2510 Atherosclerotic heart disease of native coronary artery without angina pectoris: Secondary | ICD-10-CM

## 2019-03-31 DIAGNOSIS — R9431 Abnormal electrocardiogram [ECG] [EKG]: Secondary | ICD-10-CM | POA: Diagnosis not present

## 2019-03-31 DIAGNOSIS — Z20822 Contact with and (suspected) exposure to covid-19: Secondary | ICD-10-CM | POA: Diagnosis present

## 2019-03-31 DIAGNOSIS — I5033 Acute on chronic diastolic (congestive) heart failure: Secondary | ICD-10-CM

## 2019-03-31 DIAGNOSIS — I110 Hypertensive heart disease with heart failure: Secondary | ICD-10-CM | POA: Diagnosis present

## 2019-03-31 DIAGNOSIS — Z800 Family history of malignant neoplasm of digestive organs: Secondary | ICD-10-CM

## 2019-03-31 DIAGNOSIS — I714 Abdominal aortic aneurysm, without rupture, unspecified: Secondary | ICD-10-CM

## 2019-03-31 DIAGNOSIS — Z880 Allergy status to penicillin: Secondary | ICD-10-CM

## 2019-03-31 DIAGNOSIS — M1990 Unspecified osteoarthritis, unspecified site: Secondary | ICD-10-CM | POA: Diagnosis present

## 2019-03-31 DIAGNOSIS — E119 Type 2 diabetes mellitus without complications: Secondary | ICD-10-CM | POA: Diagnosis present

## 2019-03-31 DIAGNOSIS — I951 Orthostatic hypotension: Secondary | ICD-10-CM | POA: Diagnosis not present

## 2019-03-31 DIAGNOSIS — R0602 Shortness of breath: Secondary | ICD-10-CM

## 2019-03-31 DIAGNOSIS — Z79899 Other long term (current) drug therapy: Secondary | ICD-10-CM

## 2019-03-31 DIAGNOSIS — Z7982 Long term (current) use of aspirin: Secondary | ICD-10-CM

## 2019-03-31 DIAGNOSIS — Z7984 Long term (current) use of oral hypoglycemic drugs: Secondary | ICD-10-CM

## 2019-03-31 DIAGNOSIS — N400 Enlarged prostate without lower urinary tract symptoms: Secondary | ICD-10-CM | POA: Diagnosis present

## 2019-03-31 DIAGNOSIS — I4891 Unspecified atrial fibrillation: Secondary | ICD-10-CM | POA: Diagnosis present

## 2019-03-31 DIAGNOSIS — I5031 Acute diastolic (congestive) heart failure: Secondary | ICD-10-CM | POA: Diagnosis present

## 2019-03-31 DIAGNOSIS — Z87891 Personal history of nicotine dependence: Secondary | ICD-10-CM

## 2019-03-31 DIAGNOSIS — E669 Obesity, unspecified: Secondary | ICD-10-CM | POA: Diagnosis present

## 2019-03-31 DIAGNOSIS — Z86718 Personal history of other venous thrombosis and embolism: Secondary | ICD-10-CM

## 2019-03-31 DIAGNOSIS — R0600 Dyspnea, unspecified: Secondary | ICD-10-CM

## 2019-03-31 DIAGNOSIS — R42 Dizziness and giddiness: Secondary | ICD-10-CM

## 2019-03-31 DIAGNOSIS — Z86711 Personal history of pulmonary embolism: Secondary | ICD-10-CM

## 2019-03-31 DIAGNOSIS — Z7901 Long term (current) use of anticoagulants: Secondary | ICD-10-CM | POA: Diagnosis not present

## 2019-03-31 DIAGNOSIS — Z8249 Family history of ischemic heart disease and other diseases of the circulatory system: Secondary | ICD-10-CM

## 2019-03-31 DIAGNOSIS — Z9842 Cataract extraction status, left eye: Secondary | ICD-10-CM

## 2019-03-31 DIAGNOSIS — Z955 Presence of coronary angioplasty implant and graft: Secondary | ICD-10-CM

## 2019-03-31 DIAGNOSIS — Z96643 Presence of artificial hip joint, bilateral: Secondary | ICD-10-CM | POA: Diagnosis present

## 2019-03-31 DIAGNOSIS — G4733 Obstructive sleep apnea (adult) (pediatric): Secondary | ICD-10-CM | POA: Diagnosis present

## 2019-03-31 DIAGNOSIS — J449 Chronic obstructive pulmonary disease, unspecified: Secondary | ICD-10-CM | POA: Diagnosis not present

## 2019-03-31 DIAGNOSIS — I10 Essential (primary) hypertension: Secondary | ICD-10-CM

## 2019-03-31 DIAGNOSIS — Z981 Arthrodesis status: Secondary | ICD-10-CM

## 2019-03-31 DIAGNOSIS — Z9841 Cataract extraction status, right eye: Secondary | ICD-10-CM

## 2019-03-31 LAB — BASIC METABOLIC PANEL
Anion gap: 10 (ref 5–15)
BUN: 18 mg/dL (ref 8–23)
CO2: 24 mmol/L (ref 22–32)
Calcium: 10.3 mg/dL (ref 8.9–10.3)
Chloride: 108 mmol/L (ref 98–111)
Creatinine, Ser: 1.13 mg/dL (ref 0.61–1.24)
GFR calc Af Amer: >60 mL/min (ref >60)
GFR calc non Af Amer: >60 mL/min (ref >60)
Glucose, Bld: 89 mg/dL (ref 70–99)
Potassium: 4.6 mmol/L (ref 3.5–5.1)
Sodium: 142 mmol/L (ref 135–145)

## 2019-03-31 LAB — CBC WITH DIFFERENTIAL/PLATELET
Abs Immature Granulocytes: 0.04 10*3/uL (ref 0.00–0.07)
Basophils Absolute: 0.1 10*3/uL (ref 0.0–0.1)
Basophils Relative: 1 %
Eosinophils Absolute: 0.7 10*3/uL — ABNORMAL HIGH (ref 0.0–0.5)
Eosinophils Relative: 7 %
HCT: 34 % — ABNORMAL LOW (ref 39.0–52.0)
Hemoglobin: 11.5 g/dL — ABNORMAL LOW (ref 13.0–17.0)
Immature Granulocytes: 0 %
Lymphocytes Relative: 31 %
Lymphs Abs: 3.2 10*3/uL (ref 0.7–4.0)
MCH: 35.6 pg — ABNORMAL HIGH (ref 26.0–34.0)
MCHC: 33.8 g/dL (ref 30.0–36.0)
MCV: 105.3 fL — ABNORMAL HIGH (ref 80.0–100.0)
Monocytes Absolute: 1.4 10*3/uL — ABNORMAL HIGH (ref 0.1–1.0)
Monocytes Relative: 13 %
Neutro Abs: 4.9 10*3/uL (ref 1.7–7.7)
Neutrophils Relative %: 48 %
Platelets: 455 10*3/uL — ABNORMAL HIGH (ref 150–400)
RBC: 3.23 MIL/uL — ABNORMAL LOW (ref 4.22–5.81)
RDW: 16.5 % — ABNORMAL HIGH (ref 11.5–15.5)
WBC: 10.2 10*3/uL (ref 4.0–10.5)
nRBC: 1.2 % — ABNORMAL HIGH (ref 0.0–0.2)

## 2019-03-31 LAB — PROTIME-INR
INR: 3.3 — ABNORMAL HIGH (ref 0.8–1.2)
Prothrombin Time: 33.2 seconds — ABNORMAL HIGH (ref 11.4–15.2)

## 2019-03-31 LAB — MAGNESIUM: Magnesium: 1.8 mg/dL (ref 1.7–2.4)

## 2019-03-31 LAB — SARS CORONAVIRUS 2 (TAT 6-24 HRS): SARS Coronavirus 2: NEGATIVE

## 2019-03-31 LAB — GLUCOSE, CAPILLARY
Glucose-Capillary: 97 mg/dL (ref 70–99)
Glucose-Capillary: 94 mg/dL (ref 70–99)

## 2019-03-31 LAB — BRAIN NATRIURETIC PEPTIDE: B Natriuretic Peptide: 363.7 pg/mL — ABNORMAL HIGH (ref 0.0–100.0)

## 2019-03-31 LAB — MRSA PCR SCREENING: MRSA by PCR: NEGATIVE

## 2019-03-31 MED ORDER — VENLAFAXINE HCL ER 75 MG PO CP24
75.0000 mg | ORAL_CAPSULE | Freq: Every day | ORAL | Status: DC
  Administered 2019-04-01 – 2019-04-03 (×2): 75 mg via ORAL
  Filled 2019-03-31 (×2): qty 1

## 2019-03-31 MED ORDER — ASPIRIN 81 MG PO CHEW
81.0000 mg | CHEWABLE_TABLET | Freq: Every day | ORAL | Status: DC
  Administered 2019-04-01 – 2019-04-03 (×3): 81 mg via ORAL
  Filled 2019-03-31 (×3): qty 1

## 2019-03-31 MED ORDER — METOPROLOL TARTRATE 50 MG PO TABS: 50.0000 mg | ORAL_TABLET | Freq: Two times a day (BID) | ORAL | Status: DC

## 2019-03-31 MED ORDER — ACETAMINOPHEN 325 MG PO TABS
650.0000 mg | ORAL_TABLET | ORAL | Status: DC | PRN
  Administered 2019-04-02: 650 mg via ORAL
  Filled 2019-03-31: qty 2

## 2019-03-31 MED ORDER — INSULIN ASPART 100 UNIT/ML ~~LOC~~ SOLN: 0.0000 [IU] | Freq: Every day | SUBCUTANEOUS | Status: DC

## 2019-03-31 MED ORDER — ALBUTEROL SULFATE (2.5 MG/3ML) 0.083% IN NEBU: 3.0000 mL | INHALATION_SOLUTION | Freq: Four times a day (QID) | RESPIRATORY_TRACT | Status: DC | PRN

## 2019-03-31 MED ORDER — INSULIN ASPART 100 UNIT/ML ~~LOC~~ SOLN: 0.0000 [IU] | Freq: Three times a day (TID) | SUBCUTANEOUS | Status: DC

## 2019-03-31 MED ORDER — METOPROLOL TARTRATE 25 MG PO TABS
25.0000 mg | ORAL_TABLET | Freq: Two times a day (BID) | ORAL | Status: DC
  Administered 2019-04-01 (×3): 25 mg via ORAL
  Filled 2019-03-31 (×3): qty 1

## 2019-03-31 MED ORDER — ONDANSETRON HCL 4 MG/2ML IJ SOLN: 4.0000 mg | Freq: Four times a day (QID) | INTRAMUSCULAR | Status: DC | PRN

## 2019-03-31 MED ORDER — PANTOPRAZOLE SODIUM 40 MG PO TBEC
40.0000 mg | DELAYED_RELEASE_TABLET | Freq: Every day | ORAL | Status: DC
  Administered 2019-04-01 – 2019-04-03 (×3): 40 mg via ORAL
  Filled 2019-03-31 (×3): qty 1

## 2019-03-31 MED ORDER — VERAPAMIL HCL ER 120 MG PO TBCR
120.0000 mg | EXTENDED_RELEASE_TABLET | Freq: Every day | ORAL | Status: DC
  Administered 2019-04-01 – 2019-04-02 (×3): 120 mg via ORAL
  Filled 2019-03-31 (×4): qty 1

## 2019-03-31 MED ORDER — ZOLPIDEM TARTRATE 5 MG PO TABS
5.0000 mg | ORAL_TABLET | Freq: Every evening | ORAL | Status: DC | PRN
  Administered 2019-04-02: 5 mg via ORAL
  Filled 2019-03-31: qty 1

## 2019-03-31 MED ORDER — SOTALOL HCL 80 MG PO TABS
80.0000 mg | ORAL_TABLET | Freq: Two times a day (BID) | ORAL | Status: DC
  Administered 2019-04-01 (×3): 80 mg via ORAL
  Filled 2019-03-31 (×3): qty 1

## 2019-03-31 MED ORDER — ATORVASTATIN CALCIUM 10 MG PO TABS
10.0000 mg | ORAL_TABLET | Freq: Every day | ORAL | Status: DC
  Administered 2019-03-31 – 2019-04-02 (×3): 10 mg via ORAL
  Filled 2019-03-31 (×3): qty 1

## 2019-03-31 MED ORDER — NITROGLYCERIN 0.4 MG SL SUBL: 0.4000 mg | SUBLINGUAL_TABLET | SUBLINGUAL | Status: DC | PRN

## 2019-03-31 NOTE — Telephone Encounter (Signed)
My Kardiamobile device shows I'm in A-Fib. Do I wait for it to pass, go to emergency room or come to you. Thx  Ph # [phone]  Above message copied from my chart message. I spoke with patient. He reports for the last 3-4 days he has been having shortness of breath with any exertion and feeling like he will pass out.  He is OK when sitting or lying down. Has not passed out. Last office note indicates patient has shortness of breath when he walks. Patient reports shortness of breath he is having now is worse and constant with any exertion.  He used [employer] mobile app this morning because of his symptoms. App showed he is in afib. Heart rate in the 90's. Usual heart rate in the 60's. Has not checked BP. Is not able to send Kardia mobile readings through my chart. I told patient I would send message to Dr Percival Spanish. I advised him to call 911 if symptoms worsen or he passes out

## 2019-03-31 NOTE — Progress Notes (Signed)
  Orthostatic Lying   BP- Lying 122/83  Pulse- Lying 73  Orthostatic Sitting  BP- Sitting 104/77  Pulse- Sitting 77  Orthostatic Standing at 0 minutes  BP- Standing at 0 minutes 113/77  Pulse- Standing at 0 minutes 74  Oxygen Therapy  SpO2 100 %  O2 Device Nasal Cannula  O2 Flow Rate (L/min) 2 L/min

## 2019-03-31 NOTE — Patient Instructions (Signed)
YOU HAVE BEEN RECOMMENDED TO [facility] FOR FURTHER  ANALYSIS FOR YOUR ATRIAL FIBRILLATION AND SHORTNESS OF BREATH   YOU HAVE BEEN ASSIGNED TO FLOOR 3 EAST.  PLEASE GO TO THE NORTH TOWER ENTRANCE OF [facility]  FOR DIRECT ADMITTING  WITH IN A HOUR

## 2019-03-31 NOTE — Telephone Encounter (Signed)
Spoke with patient and he is seeing Shanon Rosser PA today

## 2019-03-31 NOTE — Progress Notes (Signed)
Nurse spoke pt's spouse regarding plan of care. Questions and concerns were answered.

## 2019-03-31 NOTE — Consult Note (Addendum)
Cardiology Consultation:   Patient ID: [name] [name] MRN: [id number]; DOB: Jun 24, 1945  Admit date: 03/31/2019 Date of Consult: 03/31/2019  Primary Care Provider: Binnie Rail, MD Primary Cardiologist: Minus Breeding, MD  Primary Electrophysiologist:  None    Patient Profile:   [name] [name] is a 74 y.o. male with a hx of CAD (PCI to LAD 2015), HTN, HLD, AAA, DVT/PE (occurred post-op 10 yrs or so ago), OSA w/CPAP and reported compliance, COPD (follows w/Dr. Halford Chessman), and AFib who is being seen today for the evaluation of AFib at the request of Dr. Percival Spanish.   AFib hx Flecainide started 2012  >> stopped with development of CAD Sotalol started 2016 notes report started in [place] clinic 120mg  BID required down-titration 2/2 QT prolongation to 80mg  BID was stopped post PVI ablation PVI ablation, [facility] 2016    History of Present Illness:   [name] [name] follows with Dr. Percival Spanish  2020 [place] with DOE and increased HR's, he underwent R/L heart cath Jun 2020 with no obstructive CAD, patent stent and normal R/L heart filling pressures, no gradient accross AV with/without provocation. Dr. Percival Spanish mentions he wore a monitor with some SVT, no clear AFib, though the patient did decide to see his prior cardiologist in [place].  Chart notes report he had a dobutamine stress test to look for an inducible gradient across the aortic outflow tract, systolic anterior motion, and elevated pulmonary pressures.  There was no clear evidence of systolic anterior motion.  Pulmonary pressures were unable to be measured.  There was an increased ventricular gradient with dobutamine that was 100 mmHg.  It was unclear as to whether the gradient was subvalvular or mid cavity.  Dr. Percival Spanish increased patient's verapamil dose.  He had ongoing DOE, I the office Dec with hall walk his sat decreased to 89%.  The patient had prior CPX that found there was some component of deconditioning and weight issues  contributing to shortness of breath.  Patient also had a high blood pressure response.  There was discussion of losing weight.  He was switched from verapamil to Calan SR 120 mg.  Today he was seen with c/o 3-4 days of worse DOE and dizziness when standing, his phone app detecting Afib.  In the office he was in rate controlled AFib.  Was decided to direct admit the patient for Afib management, EP consultation and perhaps Tikosyn initiation.    LABS (03/22/19) K+ 4.6 Mag 1.8 BUN/Creat 18/1.13 (Calc CrCl is 92) WBC 10.2 H/H 11/34 Plts 455  INR 3.3  COVID pending  CXR clear  He tells me he has been unusually SOB/DOE for about a year, more so the last 8 mo or so.  Despite numerous evaluations and tests, unclear etiology.  He felt himself go into AFib Sunday and has felt progressively worse sine.  Significantly worse DOE, generalized weakness, and dizzy, and on a couple occassions near syncopal upon standing.  No CP No syncope  He prefers to be on warfarin,mentions discussed historically with him    Heart Pathway Score:     Past Medical History:  Diagnosis Date  . Arthritis   . Asthma   . BPH (benign prostatic hypertrophy)   . CAD (coronary artery disease)    a. 07/2013: s/p DES to LAD, normal LVF.  [name] Complication of anesthesia    "I got all kinds of hallucinations"  . COPD (chronic obstructive pulmonary disease) ([place])    a. 07/2013 PFT's mild airflow obstruction, no restriction, sev  decrease in DLCO.  . Diabetes mellitus without complication ([place])   . Diverticulosis   . DVT (deep venous thrombosis) ([place])    a. 2010 Lower ext s/p back surgery.  [name] Dyspnea on exertion    a. 07/2013 PFT's mild airflow obstr   . GERD (gastroesophageal reflux disease)   . History of gout   . History of hiatal hernia   . Hx of echocardiogram 2015   Echo (06/2013): EF 60-65% normal wall motion, normal diastolic function, aortic sclerosis without stenosis, Trivial MR, mild SAM due to long, redundant  mitral leaflets, mild RAE, normal RVSF  . Hypertension   . Macrocytic anemia    a. 07/2013: documented on prior labs.  . Obesity   . OSA (obstructive sleep apnea) 11/14/2015  . PAF (paroxysmal atrial fibrillation) ([place])    a. Flecainide discontinued 07/2013 in setting of CAD.; b. s/p PVI Ablation at [facility] (Dr Joseph Berkshire) 11/2014  . Pneumonia ~ 2010 X 1  . Pulmonary embolism ([place])    a. 2010 in setting of DVT post-op back surgery. b. Low prob VQ 07/2013.  [name] Sleep apnea    on cpap  . Spinal stenosis    Congential  . Transaminitis    a. 07/2013: mild.  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 ANTERIOR LUMBAR [place] ARTHROPLASTY  03/2008   "spacer poped out; had to repair"  . CATARACT EXTRACTION W/ INTRAOCULAR LENS  IMPLANT, BILATERAL Bilateral   . COLONOSCOPY    . COLONOSCOPY W/ POLYPECTOMY  2004  . Colonoscopy with polypectomy  09/2011   2 tubular adenomas  . CORONARY ANGIOPLASTY WITH STENT PLACEMENT  08/08/2013   "1"  . JOINT REPLACEMENT    . LEFT AND RIGHT HEART CATHETERIZATION WITH CORONARY ANGIOGRAM N/A 08/07/2013   Procedure: LEFT AND RIGHT HEART CATHETERIZATION WITH CORONARY ANGIOGRAM;  Surgeon: Peter M Martinique, MD;  Location: [facility] CATH LAB;  Service: Cardiovascular;  Laterality: N/A;  . LUMBAR FUSION  02/2008  . PERCUTANEOUS CORONARY STENT INTERVENTION (PCI-S)  08/07/2013   Procedure: PERCUTANEOUS CORONARY STENT INTERVENTION (PCI-S);  Surgeon: Peter M Martinique, MD;  Location: [facility] CATH LAB;  Service: Cardiovascular;;  . PVI ablation  11/2014   Dr. Venita Sheffield [facility]  . RIGHT/LEFT HEART CATH AND CORONARY ANGIOGRAPHY N/A 08/15/2018   Procedure: RIGHT/LEFT HEART CATH AND CORONARY ANGIOGRAPHY;  Surgeon: Nelva Bush, MD;  Location: [place] CV LAB;  Service: Cardiovascular;  Laterality: N/A;  . TOTAL HIP ARTHROPLASTY Bilateral   . VASECTOMY       Home Medications:  Prior to Admission medications   Medication Sig  Start Date End Date Taking? Authorizing Provider  albuterol (VENTOLIN HFA) 108 (90 Base) MCG/ACT inhaler Inhale 2 puffs into the lungs every 6 (six) hours as needed for wheezing or shortness of breath. 12/09/18   Martyn Ehrich, NP  aspirin 81 MG chewable tablet Chew 81 mg by mouth daily.  11/18/14   [provider]  atorvastatin (LIPITOR) 10 MG tablet Take 10 mg by mouth daily.    [provider]  Cholecalciferol (VITAMIN D3 PO) Take 1 capsule by mouth daily.    [provider]  Eszopiclone 3 MG TABS Take 3 mg by mouth at bedtime. Take immediately before bedtime    [provider]  metFORMIN (GLUCOPHAGE) 500 MG tablet Take 1 tablet (500 mg total) by mouth 2 (two) times daily with a meal. 03/22/19   Burns, Claudina Lick,  MD  metoprolol tartrate (LOPRESSOR) 50 MG tablet Take 1 tablet (50 mg total) by mouth 2 (two) times daily. 03/23/19   Minus Breeding, MD  nitroGLYCERIN (NITROSTAT) 0.4 MG SL tablet Place 1 tablet (0.4 mg total) under the tongue every 5 (five) minutes as needed for chest pain (up to 3 doses). 01/09/15   Minus Breeding, MD  pantoprazole (PROTONIX) 40 MG tablet TAKE 1 TABLET DAILY 02/03/19   Minus Breeding, MD  venlafaxine XR (EFFEXOR-XR) 75 MG 24 hr capsule Take 75 mg by mouth daily with breakfast.    [provider]  verapamil (CALAN-SR) 120 MG CR tablet Take 1 tablet (120 mg total) by mouth at bedtime. 02/24/19   Minus Breeding, MD  vitamin B-12 (CYANOCOBALAMIN) 1000 MCG tablet Take 1,000 mcg by mouth daily.    [provider]  warfarin (COUMADIN) 3 MG tablet TAKE 2 TABLETS DAILY OR AS DIRECTED BY COUMADIN CLINIC 01/09/19   Minus Breeding, MD    Inpatient Medications: Scheduled Meds: . [START ON 04/01/2019] aspirin  81 mg Oral Daily  . atorvastatin  10 mg Oral q1800  . insulin aspart  0-15 Units Subcutaneous TID WC  . insulin aspart  0-5 Units Subcutaneous QHS  . metoprolol tartrate  50 mg Oral BID  . [START ON 04/01/2019]  pantoprazole  40 mg Oral Daily  . [START ON 04/01/2019] venlafaxine XR  75 mg Oral Q breakfast  . verapamil  120 mg Oral QHS   Continuous Infusions:  PRN Meds: acetaminophen, albuterol, nitroGLYCERIN, ondansetron (ZOFRAN) IV, zolpidem  Allergies:    Allergies  Allergen Reactions  . Penicillins Anaphylaxis    Did it involve swelling of the face/tongue/throat, SOB, or low BP? Yes Did it involve sudden or severe rash/hives, skin peeling, or any reaction on the inside of your mouth or nose? Yes Did you need to seek medical attention at a hospital or doctor's office? Yes When did it last happen?30 + years If all above answers are "NO", may proceed with cephalosporin use.     Social History:   Social History   Socioeconomic History  . Marital status: Married    Spouse name: Not on file  . Number of children: 4  . Years of education: Not on file  . Highest education level: Not on file  Occupational History  . Occupation: Retired from [employer]    Employer: RETIRED  Tobacco Use  . Smoking status: Former Smoker    Packs/day: 2.00    Years: 40.00    Pack years: 80.00    Types: Cigarettes    Quit date: 03/09/2000    Years since quitting: 19.0  . Smokeless tobacco: Never Used  . Tobacco comment: smoked 1964-2002, up to 3 ppd  Substance and Sexual Activity  . Alcohol use: Yes    Alcohol/week: 2.0 - 3.0 standard drinks    Types: 2 - 3 Cans of beer per week    Comment: drinks beer several times weekly  . Drug use: No  . Sexual activity: Never    Comment: "sexually hx is none of your business" (02/08/2014)  Other Topics Concern  . Not on file  Social History Narrative  . Not on file   Social Determinants of Health   Financial Resource Strain:   . Difficulty of Paying Living Expenses: Not on file  Food Insecurity:   . Worried About Charity fundraiser in the Last Year: Not on file  . Ran Out of Food in the Last Year: Not on  file  Transportation Needs:   . Lexicographer (Medical): Not on file  . Lack of Transportation (Non-Medical): Not on file  Physical Activity:   . Days of Exercise per Week: Not on file  . Minutes of Exercise per Session: Not on file  Stress:   . Feeling of Stress : Not on file  Social Connections:   . Frequency of Communication with Friends and Family: Not on file  . Frequency of Social Gatherings with Friends and Family: Not on file  . Attends Religious Services: Not on file  . Active Member of Clubs or Organizations: Not on file  . Attends Archivist Meetings: Not on file  . Marital Status: Not on file  Intimate Partner Violence:   . Fear of Current or Ex-Partner: Not on file  . Emotionally Abused: Not on file  . Physically Abused: Not on file  . Sexually Abused: Not on file    Family History:   Family History  Problem Relation Age of Onset  . Heart attack Paternal Grandfather        Over 77  . Atrial fibrillation Mother   . COPD Mother   . Heart disease Father   . Colon cancer Father   . Heart attack Brother 10  . Heart disease Brother   . Alcohol abuse Brother 58       Died post liver transplant  . Heart attack Paternal Uncle        Less than 78  . Stroke Neg Hx   . Rectal cancer Neg Hx   . Stomach cancer Neg Hx   . Esophageal cancer Neg Hx      ROS:  Please see the history of present illness.  All other ROS reviewed and negative.     Physical Exam/Data:   Vitals:   03/31/19 1330  BP: 111/85  Pulse: 75  Resp: 18  Temp: 98.2 F (36.8 C)  TempSrc: Oral  SpO2: 100%   No intake or output data in the 24 hours ending 03/31/19 1331 Last 3 Weights 03/31/2019 03/22/2019 02/24/2019  Weight (lbs) 246 lb 266 lb 11.2 oz 278 lb  Weight (kg) 111.585 kg 120.974 kg 126.1 kg     There is no height or weight on file to calculate BMI.  General:  Well nourished, well developed, in no acute distress HEENT: normal Lymph: no adenopathy Neck: no JVD Endocrine:  No thryomegaly Vascular: No  carotid bruits Cardiac:  irreg-irreg; no murmurs, gallops or rubs Lungs:  CTA b/l,I do not appreciate any active wheezing, no rhonchi or rales  Abd: soft, nontender, obese Ext: trace if any edema Musculoskeletal:  No deformities Skin: warm and dry  Neuro:  No gross focal abnormalities noted Psych:  Normal affect   EKG:  The EKG was personally reviewed and demonstrates:    AFib 69bpm, no ST/T changes    Telemetry:  Telemetry was personally reviewed and demonstrates:   Afib 70's  Relevant CV Studies:  Dobutamine stress echo 12/02/2018 1. Left ventricular ejection fraction, by visual estimation, is 55 to 60%. The left ventricle has normal function. Normal left ventricular size. There is mild concentric left ventricular hypertrophy. No significant LV outflow tract or mid-cavity gradient or mitral valve systolic anterior motion at baseline. 2. Left ventricular diastolic Doppler parameters are consistent with impaired relaxation pattern of LV diastolic filling. 3. The tricuspid valve is normal in structure. Tricuspid valve regurgitation is trivial. 4. The aortic valve is tricuspid Aortic  valve regurgitation was not visualized by color flow Doppler. Mild aortic valve sclerosis without stenosis. 5. The mitral valve is normal in structure. Trace mitral valve regurgitation. No evidence of mitral stenosis. 6. Left atrial size was normal. 7. Right atrial size was normal. 8. Global right ventricle has normal systolic function.The right ventricular size is normal. No increase in right ventricular wall thickness. 9. The inferior vena cava is normal in size with greater than 50% respiratory variability, suggesting right atrial pressure of 3 mmHg. 10. The tricuspid regurgitant velocity is 2.67 m/s, and with an assumed right atrial pressure of 3 mmHg, the estimated right ventricular systolic pressure is mildly elevated at 31.5 mmHg. 11. The patient underwent dobutamine stress up to 40  mcg. There were no regional wall motion abnormalities developing with stress. LV function became vigorous with near-cavity obliteration. Images were poor, but I never saw definitive mitral valve SAM. The peak gradient from mid-cavity to LVOT increased to > 100 mmHg by 40 mcg dobutamine. Difficult to determine if this was primarily a mid-cavity gradient or LVOT gradient (possibly combination). PA pressure was not measured at peak dobutamine. 12. Post-stress: left ventricular systolic function was [id number]. 13. Pre-stress: left ventricular systolic function was 0000000.  Abdominal aorta study January 23, 2019 Summary: Abdominal Aorta: There is evidence of abnormal dilatation of the distal Abdominal aorta. The largest aortic measurement is 3.3 cm. Mid aorta segment was not visualized; obscured by bowel gas and body habitus. The largest aortic diameter has decreased compared to prior exam. Previous diameter measurement was 3.8 cm obtained on 07/06/2017. However, the exam performed on 05/31/2015 measured the largest aortic diameter as 3.15. Differences may be attributed to technically difficult exams related body habitus and bowel gas.  Long-term Zio patch monitor 08/21/2018 for 3 days and 23 hours.  S Patient had a min HR of 53 bpm, max HR of 207 bpm, and avg HR of 74 bpm. Predominant underlying rhythm was Sinus Rhythm. Slight P wave morphology changes were noted. 40 Supraventricular Tachycardia runs occurred, the run with the fastest interval lasting 6 beats with a max rate of 207 bpm, the longest lasting 2 mins 37 secs with an avg rate of 96 bpm. Supraventricular Tachycardia was detected within +/- 45 seconds of symptomatic patient event(s). Isolated SVEs were rare (<1.0%), SVE Couplets were rare (<1.0%), and SVE Triplets were rare (<1.0%). Isolated VEs were rare (<1.0%), VE Couplets were rare (<1.0%), and no VE Triplets were present.  Cardiac catheterization  Conclusions: 1. Mild to  moderate coronary artery disease predominantly involving the LAD. 2. Patent stent in the proximal LAD. 3. Upper normal to mildly elevated left and right heart filling pressures. 4. No significant LVOT gradient at rest or with provocation (post-PVC). 5. Normal Fick cardiac output/index.  Recommendations: 1. Continue medical therapy and secondary prevention. 2. Consider evaluation for other potential causes of dyspnea on exertion and tachycardia.  Laboratory Data:  High Sensitivity Troponin:  No results for input(s): TROPONINIHS in the last 720 hours.   ChemistryNo results for input(s): NA, K, CL, CO2, GLUCOSE, BUN, CREATININE, CALCIUM, GFRNONAA, GFRAA, ANIONGAP in the last 168 hours.  No results for input(s): PROT, ALBUMIN, AST, ALT, ALKPHOS, BILITOT in the last 168 hours. HematologyNo results for input(s): WBC, RBC, HGB, HCT, MCV, MCH, MCHC, RDW, PLT in the last 168 hours. BNPNo results for input(s): BNP, PROBNP in the last 168 hours.  DDimer No results for input(s): DDIMER in the last 168 hours.   Radiology/Studies:  No results found. {  Assessment and Plan:   1. Afib paroxysmal     CHA2DS2Vasc is 5, on warfarin     h/o PVI ablation at [facility] 2016     TTE with LA data is 08/2018, described as normal in size, measured 72mm     Dobutamine stress echo 11/2018 also describes LA as normal in size      Discussed case with Dr. Lovena Le, he will see the patient this afternoon Given his history of requiring dose reduction of sotalol 2/2 QT prolongation, will start at 80mg  BID of sotalol Will reduce his lopressor in 1/2 for now, pending response to sotalol, may discontinue   2. DOE        CXR and COVID pending     This though has been on going for what sounds about a year at least and not an acute problem, though acutely worsened     Has been felt to likely be multifactorial, weight, deconditioning, HTN (in review of Dr. Juanetta Gosling note, prior CT chest, echo and cath unrevealing,  planned for CPX >> discussed above)      Seems AFib suspect to be his acute worsened symptoms in the last few days     CXR is clear, I do not appreciate any active wheezing on his exam currently     He does not appear volume OL by exam   Dr. Lovena Le will see later this afternoon Final decision on AAD Lytes and QT ar OK INR therapertic  For questions or updates, please contact [facility] Please consult [email or web address] for contact info under   Signed, Baldwin Jamaica, PA-C  03/31/2019 1:31 PM   EP Attending  Patient seen and examined. Agree with the findings as noted above. The patient presents today with recurrent atrial fib which by symptoms has been going on for about 5 days. The patient feels ok when sitting by when he gets up he feels sob. He had previously taken sotalol which was stopped after his ablation. He thinks he has had atrial fib intermittently. Of note sotalol 120 bid was reported to have prolonged his QT at the [facility] clinic and was reduce to 80 bid. His exam is as noted above. He has an IRIR rhythm with clear lungs and no edema. Neuro is non-focal. ECG reveals atrial fib with a controlled VR.   I discussed the risks/benefits/goals/expectations of starting sotalol and he wishes to proceed.  Mikle Bosworth.D.

## 2019-03-31 NOTE — Progress Notes (Signed)
ANTICOAGULATION CONSULT NOTE - Initial Consult  Pharmacy Consult for Warfarin Indication: atrial fibrillation  Allergies  Allergen Reactions  . Penicillins Anaphylaxis    Did it involve swelling of the face/tongue/throat, SOB, or low BP? Yes Did it involve sudden or severe rash/hives, skin peeling, or any reaction on the inside of your mouth or nose? Yes Did you need to seek medical attention at a hospital or doctor's office? Yes When did it last happen?30 + years If all above answers are "NO", may proceed with cephalosporin use.     Patient Measurements: Height: 6\' 2"  (188 cm) Weight: 253 lb 4.8 oz (114.9 kg) IBW/kg (Calculated) : 82.2  Vital Signs: Temp: 98.2 F (36.8 C) (01/22 1330) Temp Source: Oral (01/22 1330) BP: 111/85 (01/22 1330) Pulse Rate: 75 (01/22 1330)  Labs: No results for input(s): HGB, HCT, PLT, APTT, LABPROT, INR, HEPARINUNFRC, HEPRLOWMOCWT, CREATININE, CKTOTAL, CKMB, TROPONINIHS in the last 72 hours.  Estimated Creatinine Clearance: 84.3 mL/min (by C-G formula based on SCr of 1.02 mg/dL).   Medical History: Past Medical History:  Diagnosis Date  . Arthritis   . Asthma   . BPH (benign prostatic hypertrophy)   . CAD (coronary artery disease)    a. 07/2013: s/p DES to LAD, normal LVF.  [name] Complication of anesthesia    "I got all kinds of hallucinations"  . COPD (chronic obstructive pulmonary disease) ([place])    a. 07/2013 PFT's mild airflow obstruction, no restriction, sev decrease in DLCO.  . Diabetes mellitus without complication ([place])   . Diverticulosis   . DVT (deep venous thrombosis) ([place])    a. 2010 Lower ext s/p back surgery.  [name] Dyspnea on exertion    a. 07/2013 PFT's mild airflow obstr   . GERD (gastroesophageal reflux disease)   . History of gout   . History of hiatal hernia   . Hx of echocardiogram 2015   Echo (06/2013): EF 60-65% normal wall motion, normal diastolic function, aortic sclerosis without stenosis, Trivial MR, mild SAM due  to long, redundant mitral leaflets, mild RAE, normal RVSF  . Hypertension   . Macrocytic anemia    a. 07/2013: documented on prior labs.  . Obesity   . OSA (obstructive sleep apnea) 11/14/2015  . PAF (paroxysmal atrial fibrillation) ([place])    a. Flecainide discontinued 07/2013 in setting of CAD.; b. s/p PVI Ablation at [facility] (Dr Joseph Berkshire) 11/2014  . Pneumonia ~ 2010 X 1  . Pulmonary embolism ([place])    a. 2010 in setting of DVT post-op back surgery. b. Low prob VQ 07/2013.  [name] Sleep apnea    on cpap  . Spinal stenosis    Congential  . Transaminitis    a. 07/2013: mild.  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  INR on admission 3.3 slightly supratherapeutic, last dose taken 1/22.  PTA dosing: 6mg  daily except Fri take 3mg    Goal of Therapy:  INR 2-3 Monitor platelets by anticoagulation protocol: Yes   Plan:  No warfarin tonight d/t PTA dose taken today Daily INR, s/s bleeding   Bertis Ruddy, PharmD Clinical Pharmacist Please check AMION for all [place] numbers 03/31/2019 3:09 PM

## 2019-03-31 NOTE — H&P (Addendum)
Cardiology Admission History and Physical:   Patient ID: [name] [name] MRN: [id number]; DOB: October 08, 1945   Admission date: 03/31/2019  Primary Care Provider: Binnie Rail, MD Primary Cardiologist: Minus Breeding, MD  Primary Electrophysiologist:  None   Chief Complaint:  SOB and A FiB  Patient Profile:   [name] [name] is a 74 y.o. male with Dyspnea, dizziness, atrial fibrillation, CAD  History of Present Illness:   [name] [name] is a 74 y.o. male last encounter February 24, 2019 with Dr. Percival Spanish for history of coronary artery disease status post PCI/DES to LAD in 2015.  History of atrial flutter status DC cardioversion x2 and ablation in 2016.  However patient has ongoing paroxysmal atrial fibrillation on Coumadin.  History of hypertension AAA, DVT with PE 10 years ago after surgery.  He has sleep apnea and COPD followed by pulmonology.  Previous cardiac catheterization in June 2020.  History of DOE and increased heart rate.   Patient had recently visited his cardiologist in [place] due to a previous event monitor he wore earlier this year with evidence of brief runs of SVT which could not exclude atrial fibrillation and he wanted to be seen.   Patient had a subsequent dobutamine stress test to look for an inducible gradient across the aortic outflow tract, systolic anterior motion, and elevated pulmonary pressures.  There was no clear evidence of systolic anterior motion.  Pulmonary pressures were unable to be measured.  There was an increased ventricular gradient with dobutamine that was 100 mmHg.  It was unclear as to whether the gradient was subvalvular or mid cavity.  Dr. Percival Spanish increased patient's verapamil dose.  There was no improvement in symptoms.    At last visit he returned stating he was very short of breath when walking.  He was walked around the office at that visit and had subjective complaints of shortness of breath and oxygen saturation  decreased to 89%.  Dr. Percival Spanish went over the CPX with patient and state there was some component of deconditioning and weight issues contributing to shortness of breath.  Patient had a high blood pressure response.  There was discussion of losing weight.  He was switched from verapamil to Calan SR 120 mg.   Today he presents with a 3 to 4-day history of significant dyspnea on exertion, dizziness when attempting to stand. He has an application on his phone to detect arrhythmias.  He states the application noted he was in atrial fibrillation.  He arrives in a wheelchair.   He states just standing and attempting to walk in the exam room makes him significantly short of breath and dizzy.  He did  Today EKG on arrival shows atrial fibrillation with a rate of 80.  Previous EKG showed sinus bradycardia with a rate of 51 in July.   Patient has been attempted on flecainide and sotalol in the past with no apparent success in rhythm control.   Current cardiac medications reviewed.  Patient taking aspirin 81 mg, Coumadin 3 mg 2 tablets daily or as directed by Coumadin clinic, metoprolol 100 mg p.o. twice daily, Kaylynn 120 mg daily.  Atorvastatin 10 mg daily, nitroglycerin sublingual as needed for chest pain.  Heart Pathway Score:     Past Medical History:  Diagnosis Date   Arthritis    Asthma    BPH (benign prostatic hypertrophy)    CAD (coronary artery disease)    a. 07/2013: s/p DES to LAD, normal LVF.   Complication  of anesthesia    "I got all kinds of hallucinations"   COPD (chronic obstructive pulmonary disease) ([place])    a. 07/2013 PFT's mild airflow obstruction, no restriction, sev decrease in DLCO.   Diabetes mellitus without complication ([place])    Diverticulosis    DVT (deep venous thrombosis) ([place])    a. 2010 Lower ext s/p back surgery.   Dyspnea on exertion    a. 07/2013 PFT's mild airflow obstr    GERD (gastroesophageal reflux disease)    History of gout    History of hiatal hernia    Hx  of echocardiogram 2015   Echo (06/2013): EF 60-65% normal wall motion, normal diastolic function, aortic sclerosis without stenosis, Trivial MR, mild SAM due to long, redundant mitral leaflets, mild RAE, normal RVSF   Hypertension    Macrocytic anemia    a. 07/2013: documented on prior labs.   Obesity    OSA (obstructive sleep apnea) 11/14/2015   PAF (paroxysmal atrial fibrillation) ([place])    a. Flecainide discontinued 07/2013 in setting of CAD.; b. s/p PVI Ablation at [facility] (Dr Joseph Berkshire) 11/2014   Pneumonia ~ 2010 X 1   Pulmonary embolism ([place])    a. 2010 in setting of DVT post-op back surgery. b. Low prob VQ 07/2013.   Sleep apnea    on cpap   Spinal stenosis    Congential   Transaminitis    a. 07/2013: mild.   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    Past Surgical History:  Procedure Laterality Date   ANTERIOR LUMBAR [place] ARTHROPLASTY  03/2008   "spacer poped out; had to repair"   CATARACT EXTRACTION W/ INTRAOCULAR LENS  IMPLANT, BILATERAL Bilateral    COLONOSCOPY     COLONOSCOPY W/ POLYPECTOMY  2004   Colonoscopy with polypectomy  09/2011   2 tubular adenomas   CORONARY ANGIOPLASTY WITH STENT PLACEMENT  08/08/2013   "1"   JOINT REPLACEMENT     LEFT AND RIGHT HEART CATHETERIZATION WITH CORONARY ANGIOGRAM N/A 08/07/2013   Procedure: LEFT AND RIGHT HEART CATHETERIZATION WITH CORONARY ANGIOGRAM;  Surgeon: Peter M Martinique, MD;  Location: [facility] C[facility] LAB;  Service: Cardiovascular;  Laterality: N/A;   LUMBAR FUSION  02/2008   PERCUTANEOUS CORONARY STENT INTERVENTION (PCI-S)  08/07/2013   Procedure: PERCUTANEOUS CORONARY STENT INTERVENTION (PCI-S);  Surgeon: Peter M Martinique, MD;  Location: [facility] CATH LAB;  Service: Cardiovascular;;   PVI ablation  11/2014   Dr. Venita Sheffield [facility]   RIGHT/LEFT HEART CATH AND CORONARY ANGIOGRAPHY N/A 08/15/2018   Procedure: RIGHT/LEFT HEART CATH AND CORONARY ANGIOGRAPHY;  Surgeon: Nelva Bush, MD;  Location: [facility] CV LAB;  Service:  Cardiovascular;  Laterality: N/A;   TOTAL HIP ARTHROPLASTY Bilateral    VASECTOMY       Medications Prior to Admission: Prior to Admission medications   Medication Sig Start Date End Date Taking? Authorizing Provider  albuterol (VENTOLIN HFA) 108 (90 Base) MCG/ACT inhaler Inhale 2 puffs into the lungs every 6 (six) hours as needed for wheezing or shortness of breath. 12/09/18   Martyn Ehrich, NP  aspirin 81 MG chewable tablet Chew 81 mg by mouth daily.  11/18/14   [provider]  atorvastatin (LIPITOR) 10 MG tablet Take 10 mg by mouth daily.    [provider]  Cholecalciferol (VITAMIN D3 PO) Take 1 capsule by mouth daily.    [provider]  Eszopiclone 3 MG TABS Take 3 mg by mouth at bedtime. Take immediately before bedtime  [provider]  metFORMIN (GLUCOPHAGE) 500 MG tablet Take 1 tablet (500 mg total) by mouth 2 (two) times daily with a meal. 03/22/19   Burns, Claudina Lick, MD  metoprolol tartrate (LOPRESSOR) 50 MG tablet Take 1 tablet (50 mg total) by mouth 2 (two) times daily. 03/23/19   Minus Breeding, MD  nitroGLYCERIN (NITROSTAT) 0.4 MG SL tablet Place 1 tablet (0.4 mg total) under the tongue every 5 (five) minutes as needed for chest pain (up to 3 doses). 01/09/15   Minus Breeding, MD  pantoprazole (PROTONIX) 40 MG tablet TAKE 1 TABLET DAILY 02/03/19   Minus Breeding, MD  venlafaxine XR (EFFEXOR-XR) 75 MG 24 hr capsule Take 75 mg by mouth daily with breakfast.    [provider]  verapamil (CALAN-SR) 120 MG CR tablet Take 1 tablet (120 mg total) by mouth at bedtime. 02/24/19   Minus Breeding, MD  vitamin B-12 (CYANOCOBALAMIN) 1000 MCG tablet Take 1,000 mcg by mouth daily.    [provider]  warfarin (COUMADIN) 3 MG tablet TAKE 2 TABLETS DAILY OR AS DIRECTED BY COUMADIN CLINIC 01/09/19   Minus Breeding, MD     Allergies:    Allergies  Allergen Reactions   Penicillins Anaphylaxis    Did it involve swelling of the  face/tongue/throat, SOB, or low BP? Yes Did it involve sudden or severe rash/hives, skin peeling, or any reaction on the inside of your mouth or nose? Yes Did you need to seek medical attention at a hospital or doctor's office? Yes When did it last happen?      30 + years If all above answers are "NO", may proceed with cephalosporin use.     Social History:   Social History   Socioeconomic History   Marital status: Married    Spouse name: Not on file   Number of children: 4   Years of education: Not on file   Highest education level: Not on file  Occupational History   Occupation: Retired from [employer]: RETIRED  Tobacco Use   Smoking status: Former Smoker    Packs/day: 2.00    Years: 40.00    Pack years: 80.00    Types: Cigarettes    Quit date: 03/09/2000    Years since quitting: 19.0   Smokeless tobacco: Never Used   Tobacco comment: smoked 1964-2002, up to 3 ppd  Substance and Sexual Activity   Alcohol use: Yes    Alcohol/week: 2.0 - 3.0 standard drinks    Types: 2 - 3 Cans of beer per week    Comment: drinks beer several times weekly   Drug use: No   Sexual activity: Never    Comment: "sexually hx is none of your business" (02/08/2014)  Other Topics Concern   Not on file  Social History Narrative   Not on file   Social Determinants of Health   Financial Resource Strain:    Difficulty of Paying Living Expenses: Not on file  Food Insecurity:    Worried About [place] in the Last Year: Not on file   [employer] of Food in the Last Year: Not on file  Transportation Needs:    Lack of Transportation (Medical): Not on file   Lack of Transportation (Non-Medical): Not on file  Physical Activity:    Days of Exercise per Week: Not on file   Minutes of Exercise per Session: Not on file  Stress:    Feeling of Stress : Not on file  Social Connections:    Frequency of Communication with Friends and Family: Not on file   Frequency of Social Gatherings with  Friends and Family: Not on file   Attends Religious Services: Not on Electrical engineer or Organizations: Not on file   Attends Archivist Meetings: Not on file   Marital Status: Not on file  Intimate Partner Violence:    Fear of Current or Ex-Partner: Not on file   Emotionally Abused: Not on file   Physically Abused: Not on file   Sexually Abused: Not on file    Family History:  The patient's family history includes Alcohol abuse (age of onset: 60) in his brother; Atrial fibrillation in his mother; COPD in his mother; Colon cancer in his father; Heart attack in his paternal grandfather and paternal uncle; Heart attack (age of onset: 5) in his brother; Heart disease in his brother and father. There is no history of Stroke, Rectal cancer, Stomach cancer, or Esophageal cancer.      Physical Exam/Data:   Vitals:   03/31/19 1330  BP: 111/85  Pulse: 75  Resp: 18  Temp: 98.2 F (36.8 C)  TempSrc: Oral  SpO2: 100%   No intake or output data in the 24 hours ending 03/31/19 1336 Last 3 Weights 03/31/2019 03/22/2019 02/24/2019  Weight (lbs) 246 lb 266 lb 11.2 oz 278 lb  Weight (kg) 111.585 kg 120.974 kg 126.1 kg     There is no height or weight on file to calculate BMI.  General: Patient appears uncomfortable at rest. Neck: Supple, no elevated JVP or carotid bruits, no thyromegaly. Lungs: Mild expiratory wheezing heard throughout posterior lung fields, nonlabored breathing at rest. Cardiac: Irregularly irregular rate and rhythm, no S3 or significant systolic murmur, no pericardial rub. Extremities: No pitting edema, distal pulses 2+. Skin: Warm and dry. Neuropsychiatric: Alert and oriented x3, affect grossly appropriate.  ECG:  An ECG dated March 31, 2019 was personally reviewed today and demonstrated:  Atrial fibrillation with a rate of 80  Relevant CV Studies:  Dobutamine stress echo 05-Dec-2018 1. Left ventricular ejection fraction, by visual  estimation, is 55 to 60%. The left ventricle has normal function. Normal left ventricular size. There is mild concentric left ventricular hypertrophy. No significant LV outflow tract or mid-cavity gradient or mitral valve systolic anterior motion at baseline. 2. Left ventricular diastolic Doppler parameters are consistent with impaired relaxation pattern of LV diastolic filling. 3. The tricuspid valve is normal in structure. Tricuspid valve regurgitation is trivial. 4. The aortic valve is tricuspid Aortic valve regurgitation was not visualized by color flow Doppler. Mild aortic valve sclerosis without stenosis. 5. The mitral valve is normal in structure. Trace mitral valve regurgitation. No evidence of mitral stenosis. 6. Left atrial size was normal. 7. Right atrial size was normal. 8. Global right ventricle has normal systolic function.The right ventricular size is normal. No increase in right ventricular wall thickness. 9. The inferior vena cava is normal in size with greater than 50% respiratory variability, suggesting right atrial pressure of 3 mmHg. 10. The tricuspid regurgitant velocity is 2.67 m/s, and with an assumed right atrial pressure of 3 mmHg, the estimated right ventricular systolic pressure is mildly elevated at 31.5 mmHg. 11. The patient underwent dobutamine stress up to 40 mcg. There were no regional wall motion abnormalities developing with stress. LV function became vigorous with near-cavity obliteration. Images were poor, but I never saw definitive mitral valve SAM. The peak gradient  from mid-cavity to LVOT increased to > 100 mmHg by 40 mcg dobutamine. Difficult to determine if this was primarily a mid-cavity gradient or LVOT gradient (possibly combination). PA pressure was not measured at peak dobutamine. 12. Post-stress: left ventricular systolic function was [id number]. 13. Pre-stress: left ventricular systolic function was 0000000.   Abdominal aorta study January 23, 2019 Summary: Abdominal Aorta: There is evidence of abnormal dilatation of the distal Abdominal aorta. The largest aortic measurement is 3.3 cm. Mid aorta segment was not visualized; obscured by bowel gas and body habitus. The largest aortic diameter has decreased compared to prior exam. Previous diameter measurement was 3.8 cm obtained on 07/06/2017. However, the exam performed on 05/31/2015 measured the largest aortic diameter as 3.15. Differences may be attributed to technically difficult exams related body habitus and bowel gas.   Long-term Zio patch monitor 08/21/2018 for 3 days and 23 hours.  S Patient had a min HR of 53 bpm, max HR of 207 bpm, and avg HR of 74 bpm. Predominant underlying rhythm was Sinus Rhythm. Slight P wave morphology changes were noted. 40 Supraventricular Tachycardia runs occurred, the run with the fastest interval lasting 6 beats with a max rate of 207 bpm, the longest lasting 2 mins 37 secs with an avg rate of 96 bpm. Supraventricular Tachycardia was detected within +/- 45 seconds of symptomatic patient event(s). Isolated SVEs were rare (<1.0%), SVE Couplets were rare (<1.0%), and SVE Triplets were rare (<1.0%). Isolated VEs were rare (<1.0%), VE Couplets were rare (<1.0%), and no VE Triplets were present.   Cardiac catheterization  Conclusions: Mild to moderate coronary artery disease predominantly involving the LAD. Patent stent in the proximal LAD. Upper normal to mildly elevated left and right heart filling pressures. No significant LVOT gradient at rest or with provocation (post-PVC). Normal Fick cardiac output/index.   Recommendations: Continue medical therapy and secondary prevention. Consider evaluation for other potential causes of dyspnea on exertion and tachycardia.   Laboratory Data:  High Sensitivity Troponin:  No results for input(s): TROPONINIHS in the last 720 hours.    ChemistryNo results for input(s): NA, K, CL, CO2, GLUCOSE, BUN,  CREATININE, CALCIUM, GFRNONAA, GFRAA, ANIONGAP in the last 168 hours.  No results for input(s): PROT, ALBUMIN, AST, ALT, ALKPHOS, BILITOT in the last 168 hours. HematologyNo results for input(s): WBC, RBC, HGB, HCT, MCV, MCH, MCHC, RDW, PLT in the last 168 hours. BNPNo results for input(s): BNP, PROBNP in the last 168 hours.  DDimer No results for input(s): DDIMER in the last 168 hours.   Radiology/Studies:  No results found.        :[id number]     Assessment and Plan:  Assessment and Plan:   1. PAF (paroxysmal atrial fibrillation) ([place])   2. Dyspnea, unspecified type   3. AAA (abdominal aortic aneurysm) without rupture ([place])   4. Essential hypertension   5. SOB (shortness of breath)   6. Coronary artery disease involving native coronary artery of native heart without angina pectoris     1. PAF (paroxysmal atrial fibrillation) ([place]) Patient states he noticed some recent increases and dizziness upon standing and increasing shortness of breath for the last 3 to 4 days.  He used an app on his phone to measure heart rate and rhythm.  The application noted that he was in atrial fibrillation.  On arrival today EKG demonstrates atrial fibrillation with a change in some stuff the rate of 80.  Patient has had previous DC cardioversion x2.  Previous ablation at  [place] [facility].  Subsequently having episodes of PAF.  Last EKG in June of this year demonstrated sinus bradycardia with a rate of 51.  Patient is currently taking Calan SR 120 mg at bedtime and Coumadin 3 mg 2 pills daily.  Patient has tried flecainide and sotalol in the past without result.  Recent INR 3.4 February 06, 2019.  Plans are to direct admit to [facility] for evaluation by EP. Dr. Caryl Comes is available this weekend.  Possible DC cardioversion and or attempt to start patient on Tikosyn once the patient is evaluated by EP. Get CBC, BMP, BNP,INR.    2. AAA (abdominal aortic aneurysm) without rupture [facility]) Patient had an abdominal aortic  aneurysm study April 2019 showing there is evidence of abnormal dilatation of the mid abdominal aorta.  The largest aortic measurement is 3.83 cm.  Unable to visualize bilateral iliac arteries due to significant bowel gas.  The largest aortic diameter had increased from a previous diameter of 3.15 cm in 2017.  Patient has a pending follow-up aortic study.   4. Essential hypertension Blood pressure 114/84 today on arrival.   5. SOB (shortness of breath) Recent complaints of increased shortness of breath on minimal exertion.  Patient states he can barely stand and walk across the room for several feet without becoming significantly short of breath.  On exam he has mild expiratory wheezing throughout all posterior lung fields.  Patient has albuterol and Trelegy inhalers for use.  He sees pulmonology.  40-year history of smoking 2 packs/day.  Quit approximately 18 years ago.  Get chest x-ray   6.CAD History of coronary artery disease with previous DES stent placement to LAD in 2015.  Recent cardiac catheterization August 15, 2018 revealing mild to moderate coronary artery disease predominantly involving LAD with a patent stent in proximal LAD.  Continue aspirin 81 mg daily, atorvastatin 10 mg daily, metoprolol 100 mg p.o. twice daily, nitroglycerin sublingual 0.4 mg as needed. No ischemic changes on EKG today.      7.  Dizziness Patient complains of dizziness/lightheadedness when he attempts to stand from a lying or sitting position.  He is sitting in a wheelchair today and feels dizzy even while sitting.  Orthostatic vital signs were checked.  Blood pressure lying was 118/71 with a heart rate of 85, sitting blood pressure 113/74 with a heart rate of 87, standing blood pressure 109/61 with a heart rate of 84.  Patient was unable to stand for the subsequent 3 minutes BP check due to dizziness.  Possibly related to the atrial fibrillation will monitor.     Severity of Illness: The appropriate patient status  for this patient is OBSERVATION. Observation status is judged to be reasonable and necessary in order to provide the required intensity of service to ensure the patient's safety. The patient's presenting symptoms, physical exam findings, and initial radiographic and laboratory data in the context of their medical condition is felt to place them at decreased risk for further clinical deterioration. Furthermore, it is anticipated that the patient will be medically stable for discharge from the hospital within 2 midnights of admission. The following factors support the patient status of observation.   " The patient's presenting symptoms include " The physical exam findings include  " The initial radiographic and laboratory data are.     For questions or updates, please contact [place] Please consult [email or web address] for contact info under        Signed, Verta Ellen, NP  03/31/2019 1:36  PM    History and all data above reviewed.  Patient examined.  I agree with the findings as above.   The patient presents with increasing dyspnea.  He has had an extensive work-up as above.  He has had continued dyspnea and has been managed with medications as listed with some improvement.  However, between the last time I I saw him and now he has had rather acute increase in dyspnea.  He is also noted to be in atrial fibrillation.  I see a couple of tracings from his  Jodelle Red that indicates this.  He is not short of breath so much so that he had to come in on a wheelchair.  He is not noticing that his heart is out of rhythm.  He is not having presyncope or syncope.  He is not describing chest pressure.  The patient exam reveals [id number]  ,  Lungs: Clear  ,  [id number] as stated in the HPI and negative for all other systems.Positive bowel sounds, no rebound no guarding, Ext No  .  All available labs, radiology testing, previous records reviewed. Agree with documented assessment and plan.   The patient  has acute diastolic heart failure exacerbated by his atrial fibrillation.  I think he needs to be back in sinus rhythm.  He probably needs to be on Tikosyn.  We could consider disopyramide but he has known coronary disease.  He has been anticoagulated so could get cardioverted at any time with consideration of antiarrhythmic therapy first.  I think he needs to be in the hospital given his rather acute deterioration that he needs electrophysiology consultation.   I discussed this with the patient and his wife.  Minus Breeding  5:33 PM  03/31/2019

## 2019-03-31 NOTE — Progress Notes (Signed)
Cardiology Office Note  Date: 03/31/2019   ID: [name] [name], DOB February 20, 1946, MRN [id number]  PCP:  Binnie Rail, MD  Cardiologist:  Minus Breeding, MD Electrophysiologist:  None   Chief Complaint  Patient presents with  . Follow-up    Atrial fibrillation    History of Present Illness: [name] [name] is a 74 y.o. male last encounter February 24, 2019 with Dr. Percival Spanish for history of coronary artery disease status post PCI/DES to LAD in 2015.  History of atrial flutter status DC cardioversion x2 and ablation in 2016.  However patient has ongoing paroxysmal atrial fibrillation on Coumadin.  History of hypertension AAA, DVT with PE 10 years ago after surgery.  He has sleep apnea and COPD followed by pulmonology.  Previous cardiac catheterization in June 2020.  History of DOE and increased heart rate.  Patient had recently visited his cardiologist in [place] due to a previous event monitor he wore earlier this year with evidence of brief runs of SVT which could not exclude atrial fibrillation and he wanted to be seen.  Patient had a subsequent dobutamine stress test to look for an inducible gradient across the aortic outflow tract, systolic anterior motion, and elevated pulmonary pressures.  There was no clear evidence of systolic anterior motion.  Pulmonary pressures were unable to be measured.  There was an increased ventricular gradient with dobutamine that was 100 mmHg.  It was unclear as to whether the gradient was subvalvular or mid cavity.  Dr. Percival Spanish increased patient's verapamil dose.  There was no improvement in symptoms.   At last visit he returned stating he was very short of breath when walking.  He was walked around the office at that visit and had subjective complaints of shortness of breath and oxygen saturation decreased to 89%.  Dr. Percival Spanish went over the CPX with patient and state there was some component of deconditioning and weight issues contributing to  shortness of breath.  Patient had a high blood pressure response.  There was discussion of losing weight.  He was switched from verapamil to Calan SR 120 mg.  Today he presents with a 3 to 4-day history of significant dyspnea on exertion, dizziness when attempting to stand. He has an application on his phone to detect arrhythmias.  He states the application noted he was in atrial fibrillation.  He arrives in a wheelchair.  He states just standing and attempting to walk in the exam room makes him significantly short of breath and dizzy.  He did  Today EKG on arrival shows atrial fibrillation with a rate of 80.  Previous EKG showed sinus bradycardia with a rate of 51 in July.  Patient has been attempted on flecainide and sotalol in the past with no apparent success in rhythm control.  Current cardiac medications reviewed.  Patient taking aspirin 81 mg, Coumadin 3 mg 2 tablets daily or as directed by Coumadin clinic, metoprolol 100 mg p.o. twice daily, Kaylynn 120 mg daily.  Atorvastatin 10 mg daily, nitroglycerin sublingual as needed for chest pain.  Past Medical History:  Diagnosis Date  . Arthritis   . Asthma   . BPH (benign prostatic hypertrophy)   . CAD (coronary artery disease)    a. 07/2013: s/p DES to LAD, normal LVF.  [name] Complication of anesthesia    "I got all kinds of hallucinations"  . COPD (chronic obstructive pulmonary disease) ([place])    a. 07/2013 PFT's mild airflow obstruction, no restriction, sev decrease in  DLCO.  . Diabetes mellitus without complication ([place])   . Diverticulosis   . DVT (deep venous thrombosis) ([place])    a. 2010 Lower ext s/p back surgery.  [name] Dyspnea on exertion    a. 07/2013 PFT's mild airflow obstr   . GERD (gastroesophageal reflux disease)   . History of gout   . History of hiatal hernia   . Hx of echocardiogram 2015   Echo (06/2013): EF 60-65% normal wall motion, normal diastolic function, aortic sclerosis without stenosis, Trivial MR, mild SAM due to long,  redundant mitral leaflets, mild RAE, normal RVSF  . Hypertension   . Macrocytic anemia    a. 07/2013: documented on prior labs.  . Obesity   . OSA (obstructive sleep apnea) 11/14/2015  . PAF (paroxysmal atrial fibrillation) ([place])    a. Flecainide discontinued 07/2013 in setting of CAD.; b. s/p PVI Ablation at [facility] (Dr Joseph Berkshire) 11/2014  . Pneumonia ~ 2010 X 1  . Pulmonary embolism ([place])    a. 2010 in setting of DVT post-op back surgery. b. Low prob VQ 07/2013.  [name] Sleep apnea    on cpap  . Spinal stenosis    [place]  . Transaminitis    a. 07/2013: mild.  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 ANTERIOR LUMBAR [place] ARTHROPLASTY  03/2008   "spacer poped out; had to repair"  . CATARACT EXTRACTION W/ INTRAOCULAR LENS  IMPLANT, BILATERAL Bilateral   . COLONOSCOPY    . COLONOSCOPY W/ POLYPECTOMY  2004  . Colonoscopy with polypectomy  09/2011   2 tubular adenomas  . CORONARY ANGIOPLASTY WITH STENT PLACEMENT  08/08/2013   "1"  . JOINT REPLACEMENT    . LEFT AND RIGHT HEART CATHETERIZATION WITH CORONARY ANGIOGRAM N/A 08/07/2013   Procedure: LEFT AND RIGHT HEART CATHETERIZATION WITH CORONARY ANGIOGRAM;  Surgeon: Peter M Martinique, MD;  Location: [facility] CATH LAB;  Service: Cardiovascular;  Laterality: N/A;  . LUMBAR FUSION  02/2008  . PERCUTANEOUS CORONARY STENT INTERVENTION (PCI-S)  08/07/2013   Procedure: PERCUTANEOUS CORONARY STENT INTERVENTION (PCI-S);  Surgeon: Peter M Martinique, MD;  Location: [facility] CATH LAB;  Service: Cardiovascular;;  . PVI ablation  11/2014   Dr. Venita Sheffield [facility]  . RIGHT/LEFT HEART CATH AND CORONARY ANGIOGRAPHY N/A 08/15/2018   Procedure: RIGHT/LEFT HEART CATH AND CORONARY ANGIOGRAPHY;  Surgeon: Nelva Bush, MD;  Location: [place] CV LAB;  Service: Cardiovascular;  Laterality: N/A;  . TOTAL HIP ARTHROPLASTY Bilateral   . VASECTOMY      Current Outpatient Medications  Medication Sig Dispense Refill   . albuterol (VENTOLIN HFA) 108 (90 Base) MCG/ACT inhaler Inhale 2 puffs into the lungs every 6 (six) hours as needed for wheezing or shortness of breath. 6.7 g 1  . aspirin 81 MG chewable tablet Chew 81 mg by mouth daily.     [name] atorvastatin (LIPITOR) 10 MG tablet Take 10 mg by mouth daily.    . Cholecalciferol (VITAMIN D3 PO) Take 1 capsule by mouth daily.    . Eszopiclone 3 MG TABS Take 3 mg by mouth at bedtime. Take immediately before bedtime    . metFORMIN (GLUCOPHAGE) 500 MG tablet Take 1 tablet (500 mg total) by mouth 2 (two) times daily with a meal. 180 tablet 1  . metoprolol tartrate (LOPRESSOR) 50 MG tablet Take 1 tablet (50 mg total) by mouth 2 (two) times daily. 180 tablet 4  . nitroGLYCERIN (NITROSTAT) 0.4 MG SL  tablet Place 1 tablet (0.4 mg total) under the tongue every 5 (five) minutes as needed for chest pain (up to 3 doses). 25 tablet 4  . pantoprazole (PROTONIX) 40 MG tablet TAKE 1 TABLET DAILY 90 tablet 3  . venlafaxine XR (EFFEXOR-XR) 75 MG 24 hr capsule Take 75 mg by mouth daily with breakfast.    . verapamil (CALAN-SR) 120 MG CR tablet Take 1 tablet (120 mg total) by mouth at bedtime. 90 tablet 3  . vitamin B-12 (CYANOCOBALAMIN) 1000 MCG tablet Take 1,000 mcg by mouth daily.    [name] warfarin (COUMADIN) 3 MG tablet TAKE 2 TABLETS DAILY OR AS DIRECTED BY COUMADIN CLINIC 180 tablet 3   No current facility-administered medications for this visit.   Allergies:  Penicillins   Social History: The patient  reports that he quit smoking about 19 years ago. His smoking use included cigarettes. He has a 80.00 pack-year smoking history. He has never used smokeless tobacco. He reports current alcohol use of about 2.0 - 3.0 standard drinks of alcohol per week. He reports that he does not use drugs.   Family History: The patient's family history includes Alcohol abuse (age of onset: 30) in his brother; Atrial fibrillation in his mother; COPD in his mother; Colon cancer in his father; Heart  attack in his paternal grandfather and paternal uncle; Heart attack (age of onset: 48) in his brother; Heart disease in his brother and father.   ROS:  Please see the history of present illness. Otherwise, complete review of systems is positive for none.  All other systems are reviewed and negative.   Physical Exam: VS:  BP (!) 114/54   Pulse 80   Ht 6\' 2"  (1.88 m)   Wt 246 lb (111.6 kg)   BMI 31.58 kg/m , BMI Body mass index is 31.58 kg/m.  Wt Readings from Last 3 Encounters:  03/31/19 246 lb (111.6 kg)  03/22/19 266 lb 11.2 oz (121 kg)  02/24/19 278 lb (126.1 kg)    General: Patient appears uncomfortable at rest. Neck: Supple, no elevated JVP or carotid bruits, no thyromegaly. Lungs: Mild expiratory wheezing heard throughout posterior lung fields, nonlabored breathing at rest. Cardiac: Irregularly irregular rate and rhythm, no S3 or significant systolic murmur, no pericardial rub. Extremities: No pitting edema, distal pulses 2+. Skin: Warm and dry. Neuropsychiatric: Alert and oriented x3, affect grossly appropriate.  ECG:  An ECG dated March 31, 2019 was personally reviewed today and demonstrated:  Atrial fibrillation with a rate of 80  Recent Labwork: [facility] any other questions 03/22/2019: ALT 28; AST 28; BUN 20; Creatinine, Ser 1.02; Hemoglobin 10.7; Platelets 489.0; Potassium 4.3; Sodium 138; TSH 2.01     Component Value Date/Time   CHOL 96 03/22/2019 1416   TRIG 118.0 03/22/2019 1416   HDL 32.50 (L) 03/22/2019 1416   CHOLHDL 3 03/22/2019 1416   VLDL 23.6 03/22/2019 1416   LDLCALC 40 03/22/2019 1416    Other Studies Reviewed Today:  Dobutamine stress echo November 28, 2018 1. Left ventricular ejection fraction, by visual estimation, is 55 to 60%. The left ventricle has normal function. Normal left ventricular size. There is mild concentric left ventricular hypertrophy. No significant LV outflow tract or mid-cavity  gradient or mitral valve systolic anterior motion at baseline. 2. Left ventricular diastolic Doppler parameters are consistent with impaired relaxation pattern of LV diastolic filling. 3. The tricuspid valve is normal in structure. Tricuspid valve regurgitation is trivial. 4. The aortic  valve is tricuspid Aortic valve regurgitation was not visualized by color flow Doppler. Mild aortic valve sclerosis without stenosis. 5. The mitral valve is normal in structure. Trace mitral valve regurgitation. No evidence of mitral stenosis. 6. Left atrial size was normal. 7. Right atrial size was normal. 8. Global right ventricle has normal systolic function.The right ventricular size is normal. No increase in right ventricular wall thickness. 9. The inferior vena cava is normal in size with greater than 50% respiratory variability, suggesting right atrial pressure of 3 mmHg. 10. The tricuspid regurgitant velocity is 2.67 m/s, and with an assumed right atrial pressure of 3 mmHg, the estimated right ventricular systolic pressure is mildly elevated at 31.5 mmHg. 11. The patient underwent dobutamine stress up to 40 mcg. There were no regional wall motion abnormalities developing with stress. LV function became vigorous with near-cavity obliteration. Images were poor, but I never saw definitive mitral valve SAM. The peak gradient from mid-cavity to LVOT increased to > 100 mmHg by 40 mcg dobutamine. Difficult to determine if this was primarily a mid-cavity gradient or LVOT gradient (possibly combination). PA pressure was not measured at peak dobutamine. 12. Post-stress: left ventricular systolic function was [id number]. 13. Pre-stress: left ventricular systolic function was 0000000.  Abdominal aorta study January 23, 2019 Summary: Abdominal Aorta: There is evidence of abnormal dilatation of the distal Abdominal aorta. The largest aortic measurement is 3.3 cm. Mid aorta segment was not visualized; obscured by  bowel gas and body habitus. The largest aortic diameter has decreased compared to prior exam. Previous diameter measurement was 3.8 cm obtained on 07/06/2017. However, the exam performed on 05/31/2015 measured the largest aortic diameter as 3.15. Differences may be attributed to technically difficult exams related body habitus and bowel gas.  Long-term Zio patch monitor 08/21/2018 for 3 days and 23 hours.  S Patient had a min HR of 53 bpm, max HR of 207 bpm, and avg HR of 74 bpm. Predominant underlying rhythm was Sinus Rhythm. Slight P wave morphology changes were noted. 40 Supraventricular Tachycardia runs occurred, the run with the fastest interval lasting 6 beats with a max rate of 207 bpm, the longest lasting 2 mins 37 secs with an avg rate of 96 bpm. Supraventricular Tachycardia was detected within +/- 45 seconds of symptomatic patient event(s). Isolated SVEs were rare (<1.0%), SVE Couplets were rare (<1.0%), and SVE Triplets were rare (<1.0%). Isolated VEs were rare (<1.0%), VE Couplets were rare (<1.0%), and no VE Triplets were present.  Cardiac catheterization  Conclusions: 1. Mild to moderate coronary artery disease predominantly involving the LAD. 2. Patent stent in the proximal LAD. 3. Upper normal to mildly elevated left and right heart filling pressures. 4. No significant LVOT gradient at rest or with provocation (post-PVC). 5. Normal Fick cardiac output/index.  Recommendations: 1. Continue medical therapy and secondary prevention. 2. Consider evaluation for other potential causes of dyspnea on exertion and tachycardia.   Assessment and Plan:  1. PAF (paroxysmal atrial fibrillation) ([place])   2. Dyspnea, unspecified type   3. AAA (abdominal aortic aneurysm) without rupture ([place])   4. Essential hypertension   5. SOB (shortness of breath)   6. Coronary artery disease involving native coronary artery of native heart without angina pectoris    1. PAF (paroxysmal atrial  fibrillation) ([place]) Patient states he noticed some recent increases and dizziness upon standing and increasing shortness of breath for the last 3 to 4 days.  He used an app on his phone to measure heart rate  and rhythm.  The application noted that he was in atrial fibrillation.  On arrival today EKG demonstrates atrial fibrillation with a change in some stuff the rate of 80.  Patient has had previous DC cardioversion x2.  Previous ablation at [facility].  Subsequently having episodes of PAF.  Last EKG in June of this year demonstrated sinus bradycardia with a rate of 51.  Patient is currently taking Calan SR 120 mg at bedtime and Coumadin 3 mg 2 pills daily.  Patient has tried flecainide and sotalol in the past without result.  Recent INR 3.4 February 06, 2019.  Plans are to direct admit to [facility] for evaluation by EP. Dr. Caryl Comes is available this weekend.  Possible DC cardioversion and or attempt to start patient on Tikosyn once the patient is evaluated by EP. Get CBC, BMP, BNP,INR.   2. AAA (abdominal aortic aneurysm) without rupture [facility]) Patient had an abdominal aortic aneurysm study April 2019 showing there is evidence of abnormal dilatation of the mid abdominal aorta.  The largest aortic measurement is 3.83 cm.  Unable to visualize bilateral iliac arteries due to significant bowel gas.  The largest aortic diameter had increased from a previous diameter of 3.15 cm in 2017.  Patient has a pending follow-up aortic study.  4. Essential hypertension Blood pressure 114/84 today on arrival.  5. SOB (shortness of breath) Recent complaints of increased shortness of breath on minimal exertion.  Patient states he can barely stand and walk across the room for several feet without becoming significantly short of breath.  On exam he has mild expiratory wheezing throughout all posterior lung fields.  Patient has albuterol and Trelegy inhalers for use.  He sees pulmonology.  40-year history of smoking 2 packs/day.   Quit approximately 18 years ago.  Get chest x-ray  6.CAD History of coronary artery disease with previous DES stent placement to LAD in 2015.  Recent cardiac catheterization August 15, 2018 revealing mild to moderate coronary artery disease predominantly involving LAD with a patent stent in proximal LAD.  Continue aspirin 81 mg daily, atorvastatin 10 mg daily, metoprolol 100 mg p.o. twice daily, nitroglycerin sublingual 0.4 mg as needed. No ischemic changes on EKG today.    7.  Dizziness Patient complains of dizziness/lightheadedness when he attempts to stand from a lying or sitting position.  He is sitting in a wheelchair today and feels dizzy even while sitting.  Orthostatic vital signs were checked.  Blood pressure lying was 118/71 with a heart rate of 85, sitting blood pressure 113/74 with a heart rate of 87, standing blood pressure 109/61 with a heart rate of 84.  Patient was unable to stand for the subsequent 3 minutes BP check due to dizziness.  Possibly related to the atrial fibrillation will monitor.    Medication Adjustments/Labs and Tests Ordered: Current medicines are reviewed at length with the patient today.  Concerns regarding medicines are outlined above.    Patient Instructions  YOU HAVE BEEN RECOMMENDED TO [facility] FOR FURTHER  ANALYSIS FOR YOUR ATRIAL FIBRILLATION AND SHORTNESS OF BREATH   YOU HAVE BEEN ASSIGNED TO FLOOR 3 EAST.  PLEASE GO TO THE NORTH TOWER ENTRANCE OF [facility]  FOR DIRECT ADMITTING  WITH IN A HOUR        Area had a  Signed, Levell July, NP 03/31/2019 12:46 PM    [facility] at [place], [place], [place] [address] Phone: [phone]; Fax: [phone])  [phone] 

## 2019-04-01 DIAGNOSIS — G4733 Obstructive sleep apnea (adult) (pediatric): Secondary | ICD-10-CM | POA: Diagnosis present

## 2019-04-01 DIAGNOSIS — R9431 Abnormal electrocardiogram [ECG] [EKG]: Secondary | ICD-10-CM | POA: Diagnosis not present

## 2019-04-01 DIAGNOSIS — G4730 Sleep apnea, unspecified: Secondary | ICD-10-CM | POA: Diagnosis not present

## 2019-04-01 DIAGNOSIS — I951 Orthostatic hypotension: Secondary | ICD-10-CM | POA: Diagnosis not present

## 2019-04-01 DIAGNOSIS — K219 Gastro-esophageal reflux disease without esophagitis: Secondary | ICD-10-CM | POA: Diagnosis present

## 2019-04-01 DIAGNOSIS — I2510 Atherosclerotic heart disease of native coronary artery without angina pectoris: Secondary | ICD-10-CM | POA: Diagnosis not present

## 2019-04-01 DIAGNOSIS — I110 Hypertensive heart disease with heart failure: Secondary | ICD-10-CM | POA: Diagnosis not present

## 2019-04-01 DIAGNOSIS — Z6833 Body mass index (BMI) 33.0-33.9, adult: Secondary | ICD-10-CM | POA: Diagnosis not present

## 2019-04-01 DIAGNOSIS — Z961 Presence of intraocular lens: Secondary | ICD-10-CM | POA: Diagnosis present

## 2019-04-01 DIAGNOSIS — M1990 Unspecified osteoarthritis, unspecified site: Secondary | ICD-10-CM | POA: Diagnosis present

## 2019-04-01 DIAGNOSIS — E669 Obesity, unspecified: Secondary | ICD-10-CM | POA: Diagnosis present

## 2019-04-01 DIAGNOSIS — E119 Type 2 diabetes mellitus without complications: Secondary | ICD-10-CM | POA: Diagnosis present

## 2019-04-01 DIAGNOSIS — Z955 Presence of coronary angioplasty implant and graft: Secondary | ICD-10-CM | POA: Diagnosis not present

## 2019-04-01 DIAGNOSIS — M109 Gout, unspecified: Secondary | ICD-10-CM | POA: Diagnosis present

## 2019-04-01 DIAGNOSIS — Z20822 Contact with and (suspected) exposure to covid-19: Secondary | ICD-10-CM | POA: Diagnosis not present

## 2019-04-01 DIAGNOSIS — N400 Enlarged prostate without lower urinary tract symptoms: Secondary | ICD-10-CM | POA: Diagnosis not present

## 2019-04-01 DIAGNOSIS — I5031 Acute diastolic (congestive) heart failure: Secondary | ICD-10-CM | POA: Diagnosis not present

## 2019-04-01 DIAGNOSIS — Z86718 Personal history of other venous thrombosis and embolism: Secondary | ICD-10-CM | POA: Diagnosis not present

## 2019-04-01 DIAGNOSIS — I499 Cardiac arrhythmia, unspecified: Secondary | ICD-10-CM | POA: Diagnosis not present

## 2019-04-01 DIAGNOSIS — Z79899 Other long term (current) drug therapy: Secondary | ICD-10-CM | POA: Diagnosis not present

## 2019-04-01 DIAGNOSIS — Z9841 Cataract extraction status, right eye: Secondary | ICD-10-CM | POA: Diagnosis not present

## 2019-04-01 DIAGNOSIS — I4819 Other persistent atrial fibrillation: Secondary | ICD-10-CM | POA: Diagnosis not present

## 2019-04-01 DIAGNOSIS — Z880 Allergy status to penicillin: Secondary | ICD-10-CM | POA: Diagnosis not present

## 2019-04-01 DIAGNOSIS — I10 Essential (primary) hypertension: Secondary | ICD-10-CM | POA: Diagnosis not present

## 2019-04-01 DIAGNOSIS — Z7901 Long term (current) use of anticoagulants: Secondary | ICD-10-CM | POA: Diagnosis not present

## 2019-04-01 DIAGNOSIS — I480 Paroxysmal atrial fibrillation: Secondary | ICD-10-CM | POA: Diagnosis not present

## 2019-04-01 DIAGNOSIS — J449 Chronic obstructive pulmonary disease, unspecified: Secondary | ICD-10-CM | POA: Diagnosis not present

## 2019-04-01 DIAGNOSIS — Z86711 Personal history of pulmonary embolism: Secondary | ICD-10-CM | POA: Diagnosis not present

## 2019-04-01 LAB — BASIC METABOLIC PANEL
Anion gap: 10 (ref 5–15)
BUN: 19 mg/dL (ref 8–23)
CO2: 25 mmol/L (ref 22–32)
Calcium: 9.3 mg/dL (ref 8.9–10.3)
Chloride: 105 mmol/L (ref 98–111)
Creatinine, Ser: 1.17 mg/dL (ref 0.61–1.24)
GFR calc Af Amer: >60 mL/min (ref >60)
GFR calc non Af Amer: >60 mL/min (ref >60)
Glucose, Bld: 104 mg/dL — ABNORMAL HIGH (ref 70–99)
Potassium: 4.5 mmol/L (ref 3.5–5.1)
Sodium: 140 mmol/L (ref 135–145)

## 2019-04-01 LAB — GLUCOSE, CAPILLARY
Glucose-Capillary: 106 mg/dL — ABNORMAL HIGH (ref 70–99)
Glucose-Capillary: 102 mg/dL — ABNORMAL HIGH (ref 70–99)
Glucose-Capillary: 88 mg/dL (ref 70–99)
Glucose-Capillary: 106 mg/dL — ABNORMAL HIGH (ref 70–99)

## 2019-04-01 LAB — PROTIME-INR
INR: 3 — ABNORMAL HIGH (ref 0.8–1.2)
INR: 2.9 — ABNORMAL HIGH (ref 0.8–1.2)
Prothrombin Time: 31.1 seconds — ABNORMAL HIGH (ref 11.4–15.2)
Prothrombin Time: 30.5 seconds — ABNORMAL HIGH (ref 11.4–15.2)

## 2019-04-01 LAB — MAGNESIUM: Magnesium: 1.7 mg/dL (ref 1.7–2.4)

## 2019-04-01 MED ORDER — SODIUM CHLORIDE 0.9 % IV SOLN: INTRAVENOUS | Status: DC

## 2019-04-01 MED ORDER — WARFARIN SODIUM 3 MG PO TABS
6.0000 mg | ORAL_TABLET | Freq: Once | ORAL | Status: AC
  Administered 2019-04-01: 18:00:00 6 mg via ORAL
  Filled 2019-04-01: qty 2

## 2019-04-01 MED ORDER — HYDROCODONE-ACETAMINOPHEN 5-325 MG PO TABS
1.0000 | ORAL_TABLET | Freq: Four times a day (QID) | ORAL | Status: DC | PRN
  Administered 2019-04-01 – 2019-04-02 (×2): 1 via ORAL
  Filled 2019-04-01 (×2): qty 1

## 2019-04-01 MED ORDER — WARFARIN - PHARMACIST DOSING INPATIENT: Freq: Every day | Status: DC

## 2019-04-01 NOTE — Progress Notes (Signed)
ANTICOAGULATION CONSULT NOTE - Follow Up Consult  Pharmacy Consult for warfarin Indication: atrial fibrillation  Allergies  Allergen Reactions  . Penicillins Anaphylaxis    Did it involve swelling of the face/tongue/throat, SOB, or low BP? Yes Did it involve sudden or severe rash/hives, skin peeling, or any reaction on the inside of your mouth or nose? Yes Did you need to seek medical attention at a hospital or doctor's office? Yes When did it last happen?30 + years If all above answers are "NO", may proceed with cephalosporin use.     Patient Measurements: Height: 6\' 2"  (188 cm) Weight: 254 lb 9.6 oz (115.5 kg) IBW/kg (Calculated) : 82.2  Vital Signs: Temp: 98.4 F (36.9 C) (01/23 0745) Temp Source: Oral (01/23 0745) BP: 104/76 (01/23 0745) Pulse Rate: 74 (01/23 0745)  Labs: Recent Labs    03/31/19 1410 04/01/19 0545  HGB 11.5*  --   HCT 34.0*  --   PLT 455*  --   LABPROT 33.2*  --   INR 3.3*  --   CREATININE 1.13 1.17    Estimated Creatinine Clearance: 73.7 mL/min (by C-G formula based on SCr of 1.17 mg/dL).   Assessment: 41 yom with history of afib on warfarin PTA dosed 6 mg every day except 3 mg on Friday per med history. INR on admit 3.3, last dose 1/22 prior to admission. Per cards, plans for possible DCCV with potential for Tikosyn initiation per EP.   INR today is therapeutic at 3. H&H on admission low at 11.5/34, plts elevated at 455.   Goal of Therapy:  INR 2-3 Monitor platelets by anticoagulation protocol: Yes   Plan:   Warfarin 6 mg x 1 tonight  Daily INR and CBC  Monitor for signs of bleeding Follow up Cards work up    Thank you,   Eddie Candle, PharmD PGY-1 Pharmacy Resident   Please check amion for clinical pharmacist contact number 04/01/2019,10:53 AM

## 2019-04-01 NOTE — Progress Notes (Signed)
Patient Name: [name] [name]   Patient Profile:   [name] [name] is a 74 y.o. male with Dyspnea, dizziness, atrial fibrillation, CAD  Has  Hx of intracavitary gradient First recurrence of sustained AFib/flutter since 2016     SUBJECTIVE: feels ok   Past Medical History:  Diagnosis Date  . Arthritis   . Asthma   . BPH (benign prostatic hypertrophy)   . CAD (coronary artery disease)    a. 07/2013: s/p DES to LAD, normal LVF.  [name] Complication of anesthesia    "I got all kinds of hallucinations"  . COPD (chronic obstructive pulmonary disease) ([place])    a. 07/2013 PFT's mild airflow obstruction, no restriction, sev decrease in DLCO.  . Diabetes mellitus without complication ([place])   . Diverticulosis   . DVT (deep venous thrombosis) ([place])    a. 2010 Lower ext s/p back surgery.  [name] Dyspnea on exertion    a. 07/2013 PFT's mild airflow obstr   . GERD (gastroesophageal reflux disease)   . History of gout   . History of hiatal hernia   . Hx of echocardiogram 2015   Echo (06/2013): EF 60-65% normal wall motion, normal diastolic function, aortic sclerosis without stenosis, Trivial MR, mild SAM due to long, redundant mitral leaflets, mild RAE, normal RVSF  . Hypertension   . Macrocytic anemia    a. 07/2013: documented on prior labs.  . Obesity   . OSA (obstructive sleep apnea) 11/14/2015  . PAF (paroxysmal atrial fibrillation) ([place])    a. Flecainide discontinued 07/2013 in setting of CAD.; b. s/p PVI Ablation at [facility] (Dr Joseph Berkshire) 11/2014  . Pneumonia ~ 2010 X 1  . Pulmonary embolism ([place])    a. 2010 in setting of DVT post-op back surgery. b. Low prob VQ 07/2013.  [name] Sleep apnea    on cpap  . Spinal stenosis    Congential  . Transaminitis    a. 07/2013: mild.  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 aspirin  81 mg Oral Daily  . atorvastatin  10 mg Oral q1800  . insulin aspart  0-15 Units Subcutaneous TID WC  . insulin aspart   0-5 Units Subcutaneous QHS  . metoprolol tartrate  25 mg Oral BID  . pantoprazole  40 mg Oral Daily  . sotalol  80 mg Oral Q12H  . venlafaxine XR  75 mg Oral Q breakfast  . verapamil  120 mg Oral QHS   Continuous Infusions: acetaminophen, albuterol, nitroGLYCERIN, ondansetron (ZOFRAN) IV, zolpidem    PHYSICAL EXAM Vitals:   04/01/19 0012 04/01/19 0537 04/01/19 0700 04/01/19 0745  BP: 127/75 108/74  104/76  Pulse: 77 (!) 59  74  Resp: 20 15  18   Temp: 98.8 F (37.1 C) 97.8 F (36.6 C)  98.4 F (36.9 C)  TempSrc: Oral Oral  Oral  SpO2: 100% 99%  98%  Weight:   115.5 kg   Height:        Well developed and nourished in no acute distress HENT normal Neck supple with JVP-flat Carotids brisk and full   Clear Irregularly irregular rate and rhythm with controlled ventricular response, no murmurs or gallops Abd-soft with active BS without hepatomegaly No Clubbing cyanosis edema Skin-warm and dry A & Oriented  Grossly normal sensory and motor function   TELEMETRY: Reviewed personnally pt in afib CVR:  ECG personally reviewedfrom 0500  QT < 450 msec  Intake/Output Summary (Last 24 hours) at 04/01/2019 1159 Last data filed at 04/01/2019 0900 Gross per 24 hour  Intake 1080 ml  Output 850 ml  Net 230 ml    LABS: Basic Metabolic Panel: Recent Labs  Lab 03/31/19 1410 04/01/19 0545  NA 142 140  K 4.6 4.5  CL 108 105  CO2 24 25  GLUCOSE 89 104*  BUN 18 19  CREATININE 1.13 1.17  CALCIUM 10.3 9.3  MG 1.8 1.7   Cardiac Enzymes: No results for input(s): CKTOTAL, CKMB, CKMBINDEX, TROPONINI in the last 72 hours. CBC: Recent Labs  Lab 03/31/19 1410  WBC 10.2  NEUTROABS 4.9  HGB 11.5*  HCT 34.0*  MCV 105.3*  PLT 455*   PROTIME: Recent Labs    03/31/19 1410  LABPROT 33.2*  INR 3.3*   Liver Function Tests: No results for input(s): AST, ALT, ALKPHOS, BILITOT, PROT, ALBUMIN in the last 72 hours. No results for input(s): LIPASE, AMYLASE in the last 72  hours. BNP: BNP (last 3 results) Recent Labs    03/31/19 1410  BNP 363.7*    ProBNP (last 3 results) No results for input(s): PROBNP in the last 8760 hours.  D-Dimer: No results for input(s): DDIMER in the last 72 hours. Hemoglobin A1C: No results for input(s): HGBA1C in the last 72 hours. Fasting Lipid Panel: No results for input(s): CHOL, HDL, LDLCALC, TRIG, CHOLHDL, LDLDIRECT in the last 72 hours. Thyroid Function Tests: No results for input(s): TSH, T4TOTAL, T3FREE, THYROIDAB in the last 72 hours.  Invalid input(s): FREET3 Anemia Panel: No results for input(s): VITAMINB12, FOLATE, FERRITIN, TIBC, IRON, RETICCTPCT in the last 72 hours.      ASSESSMENT AND PLAN: Afib persistent  Dyspnea  Dizziness   Afib is newly recurrent.  Discussed with Dr [facility] and would be inclined not to use AAD given the interval of 5 years-- he is agreeable so will stop sotalol, resume his BB and plan dccv in the am, currently scheduled for 730 am  With discharge to follow  Will try again, in sinus to get orthostatics  -- could he have amyloid    Signed, Virl Axe MD  04/01/2019

## 2019-04-01 NOTE — Progress Notes (Signed)
Patient's wife and son arrive downstairs, reporting to the main desk that they were informed they could both come visit.   Pt's wife was with patient upon admission yesterday, per main desk- patient's son came in from out of state and was able to see his father for a short time yesterday.  Family reminded of visitor policy only allowing one family member per patient per visit, pt's wife reports she is high-risk for covid due to her medical hx. Pt's wife was permitted to shift her right to visit, based on this concern, to one of their children, however- with son being out-of-state resident, it was encouraged to maintain a local family member as the visitor from today forward, so it does not change again to violate policy.   Pt's wife expressed understanding to the main desk, and shortly arrived on the floor herself.

## 2019-04-01 NOTE — Plan of Care (Signed)
  Problem: Clinical Measurements: Goal: Respiratory complications will improve Outcome: Progressing   Problem: Activity: Goal: Risk for activity intolerance will decrease Outcome: Progressing   Problem: Safety: Goal: Ability to remain free from injury will improve Outcome: Progressing   

## 2019-04-02 ENCOUNTER — Inpatient Hospital Stay (HOSPITAL_COMMUNITY): Payer: Medicare Other | Admitting: Certified Registered Nurse Anesthetist

## 2019-04-02 ENCOUNTER — Encounter (HOSPITAL_COMMUNITY)
Admission: AD | Disposition: A | Discharge status: Home / Self Care | Payer: Self-pay | Source: Ambulatory Visit | Attending: Cardiology

## 2019-04-02 DIAGNOSIS — I4819 Other persistent atrial fibrillation: Secondary | ICD-10-CM

## 2019-04-02 DIAGNOSIS — I5031 Acute diastolic (congestive) heart failure: Secondary | ICD-10-CM

## 2019-04-02 HISTORY — PX: CARDIOVERSION: SHX1299

## 2019-04-02 LAB — CBC
HCT: 34 % — ABNORMAL LOW (ref 39.0–52.0)
Hemoglobin: 11.2 g/dL — ABNORMAL LOW (ref 13.0–17.0)
MCH: 35.3 pg — ABNORMAL HIGH (ref 26.0–34.0)
MCHC: 32.9 g/dL (ref 30.0–36.0)
MCV: 107.3 fL — ABNORMAL HIGH (ref 80.0–100.0)
Platelets: 415 10*3/uL — ABNORMAL HIGH (ref 150–400)
RBC: 3.17 MIL/uL — ABNORMAL LOW (ref 4.22–5.81)
RDW: 16.2 % — ABNORMAL HIGH (ref 11.5–15.5)
WBC: 9.2 10*3/uL (ref 4.0–10.5)
nRBC: 0.5 % — ABNORMAL HIGH (ref 0.0–0.2)

## 2019-04-02 LAB — MAGNESIUM: Magnesium: 1.7 mg/dL (ref 1.7–2.4)

## 2019-04-02 LAB — GLUCOSE, CAPILLARY
Glucose-Capillary: 107 mg/dL — ABNORMAL HIGH (ref 70–99)
Glucose-Capillary: 105 mg/dL — ABNORMAL HIGH (ref 70–99)
Glucose-Capillary: 95 mg/dL (ref 70–99)
Glucose-Capillary: 104 mg/dL — ABNORMAL HIGH (ref 70–99)

## 2019-04-02 LAB — BASIC METABOLIC PANEL
Anion gap: 8 (ref 5–15)
BUN: 19 mg/dL (ref 8–23)
CO2: 27 mmol/L (ref 22–32)
Calcium: 8.9 mg/dL (ref 8.9–10.3)
Chloride: 103 mmol/L (ref 98–111)
Creatinine, Ser: 0.99 mg/dL (ref 0.61–1.24)
GFR calc Af Amer: >60 mL/min (ref >60)
GFR calc non Af Amer: >60 mL/min (ref >60)
Glucose, Bld: 109 mg/dL — ABNORMAL HIGH (ref 70–99)
Potassium: 4.2 mmol/L (ref 3.5–5.1)
Sodium: 138 mmol/L (ref 135–145)

## 2019-04-02 LAB — PROTIME-INR
INR: 2.3 — ABNORMAL HIGH (ref 0.8–1.2)
Prothrombin Time: 25.2 seconds — ABNORMAL HIGH (ref 11.4–15.2)

## 2019-04-02 SURGERY — CARDIOVERSION
Anesthesia: General

## 2019-04-02 MED ORDER — LIDOCAINE HCL (CARDIAC) PF 100 MG/5ML IV SOSY
PREFILLED_SYRINGE | INTRAVENOUS | Status: DC | PRN
  Administered 2019-04-02: 100 mg via INTRATRACHEAL

## 2019-04-02 MED ORDER — SODIUM CHLORIDE 0.9 % IV SOLN: INTRAVENOUS | Status: DC

## 2019-04-02 MED ORDER — WARFARIN SODIUM 3 MG PO TABS
6.0000 mg | ORAL_TABLET | Freq: Once | ORAL | Status: AC
  Administered 2019-04-02: 18:00:00 6 mg via ORAL
  Filled 2019-04-02: qty 2

## 2019-04-02 MED ORDER — METOPROLOL TARTRATE 50 MG PO TABS: 50.0000 mg | ORAL_TABLET | Freq: Two times a day (BID) | ORAL | Status: DC

## 2019-04-02 MED ORDER — WARFARIN SODIUM 3 MG PO TABS: 3.0000 mg | ORAL_TABLET | ORAL | Status: AC

## 2019-04-02 MED ORDER — PROPOFOL 10 MG/ML IV BOLUS
INTRAVENOUS | Status: DC | PRN
  Administered 2019-04-02: 80 ug via INTRAVENOUS

## 2019-04-02 MED ORDER — METOPROLOL TARTRATE 25 MG PO TABS
25.0000 mg | ORAL_TABLET | Freq: Two times a day (BID) | ORAL | Status: DC
  Administered 2019-04-02 – 2019-04-03 (×2): 25 mg via ORAL
  Filled 2019-04-02 (×2): qty 1

## 2019-04-02 NOTE — Discharge Summary (Addendum)
Discharge Summary    Patient ID: [name] [name] MRN: [id number]; DOB: Jul 10, 1945  Admit date: 03/31/2019 Discharge date: 04/03/2019  Primary Care Provider: Binnie Rail, MD  Primary Cardiologist: Minus Breeding, MD   Primary Electrophysiologist:  New, to Dr. Caryl Comes  Discharge Diagnoses    Principal Problem:   Atrial fibrillation [facility]) Active Problems:   Acute diastolic congestive heart failure; exacerbated by recurrent atrial fibrillation  Orthostatic hypotension  Diagnostic Studies/Procedures    Direct Current Cardioversion 04/02/2019  _____________   History of Present Illness     [name] [name] is a 74 y.o. 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     Hospital Course     Consultants: EP (Dr. Lovena Le, Dr. Caryl Comes)   He was admitted and was evaluated by Dr. Lovena Le for EP.  He had previously been on Sotalol for control of his atrial fibrillation.  The dose was reduced 2/2 prolonged QT and it was ultimately DC'd after his PVI ablation.  Initially, his Sotalol was resumed at low dose.  However, Dr. Caryl Comes saw the patient yesterday and in discussion with his primary cardiologist, since it had been 5 years since his last episode of atrial fibrillation, it was decided to stop Sotalol and proceed with cardioversion.  The patient's INRs have been therapeutic and he underwent DCCV earlier this AM with return of normal sinus rhythm.  Pt initially felt stable for d/c 1/24, but had dizziness with standing when prepping for DC. + orthostatics at 97/65 ->> 68/40. DC cancelled. Compression hose placed. Metoprolol reduced to  25 mg daily. Will defer midodrine for now.  Today, he was evaluated by Dr. Caryl Comes and felt to be stable for DC home with close follow up with negative orthostatics and asymptomatic with walking halls.   INR 2.3 -> 1.8. Per Dr. Caryl Comes will cover with lovenox for the time being with close INR follow up as below.   With his constellation of symptoms, will consider work up for Amyloidosis as an outpatient.   Did the patient have an acute coronary syndrome (MI, NSTEMI, STEMI, etc) this admission?:  No                               Did the patient have a percutaneous coronary intervention (stent / angioplasty)?:  No.   _____________  Discharge Vitals Blood pressure 133/67, pulse (!) 51, temperature 97.8 F (36.6 C), temperature source Oral, resp. rate 20, height 6\' 2"  (1.88 m), weight 118 kg, SpO2 96 %.  Filed Weights   04/01/19 0700 04/02/19 0606 04/03/19 0609  Weight: 115.5 kg (!) 257.7 kg 118 kg   Orthostatic VS for the past 24 hrs:  BP- Lying Pulse- Lying BP- Sitting Pulse- Sitting BP- Standing at 0 minutes Pulse- Standing at 0 minutes  04/03/19 1023 - - - - 131/69 83  04/03/19 1022 - - 123/64 66 - -  04/03/19 1020 142/72 61 - - - -  04/03/19 0808 142/72 61 123/64 66 131/69 83    Physical Exam General: Well appearing. No resp difficulty. HEENT: Normal Neck: Supple. JVP does not appear elevated. Carotids 2+ bilat; no bruits. No  thyromegaly or nodule noted. Cor: PMI nondisplaced. RRR, No M/G/R noted Lungs: CTAB, normal effort. Abdomen: Soft, non-tender, non-distended, no HSM. No bruits or masses. +BS  Extremities: No cyanosis, clubbing, or rash. R and LLE no edema. Thigh high compression hose in place.  Neuro: Alert & orientedx3, cranial nerves grossly intact. moves all 4 extremities w/o difficulty. Affect pleasant   Labs & Radiologic Studies    CBC Recent Labs    03/31/19 1410 03/31/19 1410 04/02/19 0517 04/03/19 0430  WBC 10.2   < > 9.2 8.4  NEUTROABS 4.9  --   --   --   HGB  11.5*   < > 11.2* 9.8*  HCT 34.0*   < > 34.0* 30.1*  MCV 105.3*   < > 107.3* 107.9*  PLT 455*   < > 415* 345   < > = values in this interval not displayed.   Basic Metabolic Panel Recent Labs    04/02/19 0517 04/03/19 0430  NA 138 143  K 4.2 4.4  CL 103 106  CO2 27 27  GLUCOSE 109* 107*  BUN 19 22  CREATININE 0.99 1.12  CALCIUM 8.9 8.9  MG 1.7 1.7   _____________  DG CHEST PORT 1 VIEW Result Date: 03/31/2019 IMPRESSION: No acute disease. Electronically Signed   By: Lorriane Shire M.D.   On: 03/31/2019 14:21   Disposition   Pt is being discharged home today in good condition.  Follow-up Plans & Appointments    Follow-up Information    Minus Breeding, MD Follow up on 04/14/2019.   Specialty: Cardiology Why: at 140 pm for post hospital follow up Contact information: [address] [place] [address] [phone]        [facility] Follow up on 04/04/2019.   Why: at 0915 for coumadin check. [address] [facility] Contact information: [place] [phone]         Discharge Instructions    Diet - low sodium heart healthy   Complete by: As directed    Driving Restrictions   Complete by: As directed    Do not resume driving until tomorrow.   Driving Restrictions   Complete by: As directed    Do not drive until tomorrow   Increase activity slowly   Complete by: As directed    Increase activity slowly   Complete by: As directed       Discharge Medications   Allergies as of 04/03/2019      Reactions   Penicillins Anaphylaxis   Did it involve swelling of the face/tongue/throat, SOB, or low BP? Yes Did it involve sudden or severe rash/hives, skin peeling, or any reaction on the inside of your mouth or nose? Yes Did you need to seek medical attention at a hospital or doctor's office? Yes When did it last happen?30 + years If all above  answers are "NO", may proceed with cephalosporin use.      Medication List    TAKE these medications   albuterol 108 (90 Base) MCG/ACT inhaler Commonly known as: VENTOLIN HFA Inhale 2 puffs into the lungs every 6 (six) hours as needed for wheezing or shortness of breath.   aspirin 81 MG chewable tablet Chew 81 mg by mouth daily.   atorvastatin 10 MG tablet Commonly known as: LIPITOR Take 10 mg by mouth daily.   enoxaparin 120 MG/0.8ML injection Commonly known as: LOVENOX Inject 0.8 mLs (120 mg total) into the  skin every 12 (twelve) hours.   Eszopiclone 3 MG Tabs Take 3 mg by mouth at bedtime. Take immediately before bedtime   metFORMIN 500 MG tablet Commonly known as: GLUCOPHAGE Take 1 tablet (500 mg total) by mouth 2 (two) times daily with a meal.   metoprolol tartrate 25 MG tablet Commonly known as: LOPRESSOR Take 1 tablet (25 mg total) by mouth 2 (two) times daily. What changed:   medication strength  how much to take   nitroGLYCERIN 0.4 MG SL tablet Commonly known as: Nitrostat Place 1 tablet (0.4 mg total) under the tongue every 5 (five) minutes as needed for chest pain (up to 3 doses).   pantoprazole 40 MG tablet Commonly known as: PROTONIX TAKE 1 TABLET DAILY   traZODone 50 MG tablet Commonly known as: DESYREL Take 50 mg by mouth at bedtime.   Trelegy Ellipta 100-62.5-25 MCG/INH Aepb Generic drug: Fluticasone-Umeclidin-Vilant Inhale 1 puff into the lungs daily.   venlafaxine XR 75 MG 24 hr capsule Commonly known as: EFFEXOR-XR Take 75 mg by mouth daily with breakfast.   verapamil 120 MG CR tablet Commonly known as: CALAN-SR Take 1 tablet (120 mg total) by mouth at bedtime. What changed: Another medication with the same name was removed. Continue taking this medication, and follow the directions you see here.   vitamin B-12 1000 MCG tablet Commonly known as: CYANOCOBALAMIN Take 1,000 mcg by mouth daily.   VITAMIN D3 PO Take 1 capsule by mouth  daily.   warfarin 3 MG tablet Commonly known as: COUMADIN Take as directed. If you are unsure how to take this medication, talk to your nurse or doctor. Original instructions: Take 1-2 tablets (3-6 mg total) by mouth See admin instructions. 6mg  everyday except on Friday take 3mg  What changed: See the new instructions.          Outstanding Labs/Studies   INR in tomorrow with down trend. He will take 7.5 mg coumadin this afternoon per Pharmacy in attempt to get his INR back on track.   Duration of Discharge Encounter   Greater than 30 minutes including physician time.  Signed, Shirley Friar, PA-C 04/03/2019, 10:54 AM

## 2019-04-02 NOTE — Anesthesia Preprocedure Evaluation (Signed)
Anesthesia Evaluation  Patient identified by MRN, date of birth, ID band Patient awake    Reviewed: Allergy & Precautions, NPO status , Patient's Chart, lab work & pertinent test results  Airway Mallampati: I  TM Distance: >3 FB Neck ROM: Full    Dental   Pulmonary sleep apnea , COPD, former smoker,    Pulmonary exam normal        Cardiovascular hypertension, Pt. on medications + CAD and + Cardiac Stents  Normal cardiovascular exam     Neuro/Psych Anxiety    GI/Hepatic GERD  Medicated and Controlled,  Endo/Other  diabetes, Type 2, Oral Hypoglycemic Agents  Renal/GU      Musculoskeletal   Abdominal   Peds  Hematology   Anesthesia Other Findings   Reproductive/Obstetrics                             Anesthesia Physical Anesthesia Plan  ASA: III  Anesthesia Plan: General   Post-op Pain Management:    Induction: Intravenous  PONV Risk Score and Plan: Treatment may vary due to age or medical condition  Airway Management Planned: Mask  Additional Equipment:   Intra-op Plan:   Post-operative Plan:   Informed Consent: I have reviewed the patients History and Physical, chart, labs and discussed the procedure including the risks, benefits and alternatives for the proposed anesthesia with the patient or authorized representative who has indicated his/her understanding and acceptance.       Plan Discussed with: CRNA and Surgeon  Anesthesia Plan Comments:         Anesthesia Quick Evaluation

## 2019-04-02 NOTE — Progress Notes (Signed)
Thigh high compression applied per order, also assist patient to the chair patient still c/o weakness while transfer from bed to chair. See epic for new order. Will continue to monitor the patient.

## 2019-04-02 NOTE — Progress Notes (Signed)
  Called by RN. Patient noted dizziness with standing when getting ready for DC. BP 97/65>>68/40 I called Dr. Caryl Comes and we decided to cancel his DC today. PLAN:  1. [place] today. 2. Thigh high compression hose or an abdominal binder (RN will check to see which one we can get. 3. Reduce Metoprolol to 25 mg twice daily  4. Dr. Caryl Comes to d/w Dr. Percival Spanish +/- DC Verapamil 5. Dr. Caryl Comes will also d/w Dr. Percival Spanish +/- starting Midodrine 6. Patient to sit in chair most of the day today to prevent significant BP drop.   Richardson Dopp, PA-C    04/02/2019 11:21 AM

## 2019-04-02 NOTE — Anesthesia Postprocedure Evaluation (Signed)
Anesthesia Post Note  Patient: [name] [name]  Procedure(s) Performed: CARDIOVERSION (N/A )     Patient location during evaluation: PACU Anesthesia Type: General Level of consciousness: awake and alert Pain management: pain level controlled Vital Signs Assessment: post-procedure vital signs reviewed and stable Respiratory status: spontaneous breathing, nonlabored ventilation, respiratory function stable and patient connected to nasal cannula oxygen Cardiovascular status: blood pressure returned to baseline and stable Postop Assessment: no apparent nausea or vomiting Anesthetic complications: no    Last Vitals:  Vitals:   04/02/19 0823 04/02/19 0839  BP: 101/79 (!) 104/54  Pulse: 75 (!) 55  Resp: (!) 26 (!) 21  Temp: 36.8 C 36.7 C  SpO2: 100% 99%    Last Pain:  Vitals:   04/02/19 0823  TempSrc:   PainSc: 0-No pain                 Young Mulvey DAVID

## 2019-04-02 NOTE — Progress Notes (Signed)
Patient Name: [name] [name]   Patient Profile:   [name] [name] is a 74 y.o. male with Dyspnea, dizziness, atrial fibrillation, CAD  Has  Hx of intracavitary gradient First recurrence of sustained AFib/flutter since 2016     SUBJECTIVE:withhout chestpain or SOB  Wants to get home to watch the [employer]  Past Medical History:  Diagnosis Date  . Arthritis   . Asthma   . BPH (benign prostatic hypertrophy)   . CAD (coronary artery disease)    a. 07/2013: s/p DES to LAD, normal LVF.  [name] Complication of anesthesia    "I got all kinds of hallucinations"  . COPD (chronic obstructive pulmonary disease) ([place])    a. 07/2013 PFT's mild airflow obstruction, no restriction, sev decrease in DLCO.  . Diabetes mellitus without complication ([place])   . Diverticulosis   . DVT (deep venous thrombosis) ([place])    a. 2010 Lower ext s/p back surgery.  [name] Dyspnea on exertion    a. 07/2013 PFT's mild airflow obstr   . GERD (gastroesophageal reflux disease)   . History of gout   . History of hiatal hernia   . Hx of echocardiogram 2015   Echo (06/2013): EF 60-65% normal wall motion, normal diastolic function, aortic sclerosis without stenosis, Trivial MR, mild SAM due to long, redundant mitral leaflets, mild RAE, normal RVSF  . Hypertension   . Macrocytic anemia    a. 07/2013: documented on prior labs.  . Obesity   . OSA (obstructive sleep apnea) 11/14/2015  . PAF (paroxysmal atrial fibrillation) ([place])    a. Flecainide discontinued 07/2013 in setting of CAD.; b. s/p PVI Ablation at [facility] (Dr Joseph Berkshire) 11/2014  . Pneumonia ~ 2010 X 1  . Pulmonary embolism ([place])    a. 2010 in setting of DVT post-op back surgery. b. Low prob VQ 07/2013.  [name] Sleep apnea    on cpap  . Spinal stenosis    Congential  . Transaminitis    a. 07/2013: mild.  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 aspirin  81 mg Oral Daily  . atorvastatin  10 mg Oral q1800  . insulin  aspart  0-15 Units Subcutaneous TID WC  . insulin aspart  0-5 Units Subcutaneous QHS  . metoprolol tartrate  50 mg Oral BID  . pantoprazole  40 mg Oral Daily  . venlafaxine XR  75 mg Oral Q breakfast  . verapamil  120 mg Oral QHS  . Warfarin - Pharmacist Dosing Inpatient   Does not apply q1800   Continuous Infusions: . sodium chloride 50 mL/hr at 04/02/19 0550   acetaminophen, albuterol, HYDROcodone-acetaminophen, nitroGLYCERIN, ondansetron (ZOFRAN) IV, zolpidem    PHYSICAL EXAM Vitals:   04/01/19 1239 04/01/19 2051 04/02/19 0007 04/02/19 0606  BP: 98/66 111/72 94/71 100/73  Pulse: 78 68 66 (!) 58  Resp: 18 18 18 20   Temp: (!) 97.5 F (36.4 C) 98.1 F (36.7 C) 98.2 F (36.8 C) 98.1 F (36.7 C)  TempSrc: Oral Oral Oral Oral  SpO2: 99% 99% 99% 99%  Weight:    (!) 257.7 kg  Height:        Well developed and nourished in no acute distress HENT normal Neck supple with JVP-flat Carotids brisk and full   Clear Irregularly irregular rate and rhythm with controlled ventricular response, no murmurs or gallops Abd-soft with active BS without hepatomegaly No Clubbing cyanosis edema Skin-warm and dry A &  Oriented  Grossly normal sensory and motor function   TELEMETRY:Personally reviewed  Afib w CVR     Intake/Output Summary (Last 24 hours) at 04/02/2019 0754 Last data filed at 04/02/2019 0550 Gross per 24 hour  Intake 910 ml  Output 1250 ml  Net -340 ml    LABS: Basic Metabolic Panel: Recent Labs  Lab 03/31/19 1410 03/31/19 1410 04/01/19 0545 04/02/19 0517  NA 142  --  140 138  K 4.6  --  4.5 4.2  CL 108  --  105 103  CO2 24  --  25 27  GLUCOSE 89  --  104* 109*  BUN 18  --  19 19  CREATININE 1.13  --  1.17 0.99  CALCIUM 10.3   < > 9.3 8.9  MG 1.8   < > 1.7 1.7   < > = values in this interval not displayed.   Cardiac Enzymes: No results for input(s): CKTOTAL, CKMB, CKMBINDEX, TROPONINI in the last 72 hours. CBC: Recent Labs  Lab 03/31/19 1410  04/02/19 0517  WBC 10.2 9.2  NEUTROABS 4.9  --   HGB 11.5* 11.2*  HCT 34.0* 34.0*  MCV 105.3* 107.3*  PLT 455* 415*   PROTIME: Recent Labs    04/01/19 1218 04/01/19 2036 04/02/19 0517  LABPROT 31.1* 30.5* 25.2*  INR 3.0* 2.9* 2.3*   Liver Function Tests: No results for input(s): AST, ALT, ALKPHOS, BILITOT, PROT, ALBUMIN in the last 72 hours. No results for input(s): LIPASE, AMYLASE in the last 72 hours. BNP: BNP (last 3 results) Recent Labs    03/31/19 1410  BNP 363.7*    ProBNP (last 3 results) No results for input(s): PROBNP in the last 8760 hours.  D-Dimer: No results for input(s): DDIMER in the last 72 hours. Hemoglobin A1C: No results for input(s): HGBA1C in the last 72 hours. Fasting Lipid Panel: No results for input(s): CHOL, HDL, LDLCALC, TRIG, CHOLHDL, LDLDIRECT in the last 72 hours. Thyroid Function Tests: No results for input(s): TSH, T4TOTAL, T3FREE, THYROIDAB in the last 72 hours.  Invalid input(s): FREET3 Anemia Panel: No results for input(s): VITAMINB12, FOLATE, FERRITIN, TIBC, IRON, RETICCTPCT in the last 72 hours.      ASSESSMENT AND PLAN: Afib persistent  Dyspnea  Dizziness  For DCCV this am  I forgot, but have now, discontinued sotalol and resumed his outpt metoprolol tartrate at 50 bid   Plan discharge later this am     Signed, Virl Axe MD  04/02/2019

## 2019-04-02 NOTE — CV Procedure (Signed)
Preop Dx atrial fib Post op DX  NSR  Procedure  DC Cardioversion     Pt was sedated by anesthesia receiving 80 mg Propafol  A synchronized shock 120 joules failed to restore NSR A repeat synchronized shock at 200 w pressure also failed to restore sinus A 3rd shock, with repositioning of the posterior patch @ 200 J with pressure restored sinus Rhythm  Pt tolerated without difficulty

## 2019-04-02 NOTE — Progress Notes (Signed)
Ortho BP done patient unable to stand also c/o dizziness and weakness while standing See epic for ortho Bp. Cardiology PA notified no new order given now will continue to monitor the patient.

## 2019-04-02 NOTE — Transfer of Care (Signed)
Immediate Anesthesia Transfer of Care Note  Patient: MEGA BORGEN  Procedure(s) Performed: CARDIOVERSION (N/A )  Patient Location: PACU  Anesthesia Type:General  Level of Consciousness: awake, alert , oriented and patient cooperative  Airway & Oxygen Therapy: Patient Spontanous Breathing and Patient connected to nasal cannula oxygen  Post-op Assessment: Report given to RN and Post -op Vital signs reviewed and stable  Post vital signs: Reviewed and stable  Last Vitals:  Vitals Value Taken Time  BP 101/79 04/02/19 0823  Temp 36.8 C 04/02/19 0823  Pulse 75 04/02/19 0823  Resp 26 04/02/19 0823  SpO2 100 % 04/02/19 0823    Last Pain:  Vitals:   04/02/19 0823  TempSrc:   PainSc: 0-No pain         Complications: No apparent anesthesia complications

## 2019-04-02 NOTE — Progress Notes (Signed)
ANTICOAGULATION CONSULT NOTE - Follow Up Consult  Pharmacy Consult for warfarin Indication: atrial fibrillation  Allergies  Allergen Reactions  . Penicillins Anaphylaxis    Did it involve swelling of the face/tongue/throat, SOB, or low BP? Yes Did it involve sudden or severe rash/hives, skin peeling, or any reaction on the inside of your mouth or nose? Yes Did you need to seek medical attention at a hospital or doctor's office? Yes When did it last happen?30 + years If all above answers are "NO", may proceed with cephalosporin use.     Patient Measurements: Height: 6\' 2"  (188 cm) Weight: (!) 568 lb 2 oz (257.7 kg) IBW/kg (Calculated) : 82.2  Vital Signs: Temp: 98.1 F (36.7 C) (01/24 0839) Temp Source: Oral (01/24 0606) BP: 104/54 (01/24 0839) Pulse Rate: 55 (01/24 0839)  Labs: Recent Labs    03/31/19 1410 03/31/19 1410 04/01/19 0545 04/01/19 1218 04/01/19 2036 04/02/19 0517  HGB 11.5*  --   --   --   --  11.2*  HCT 34.0*  --   --   --   --  34.0*  PLT 455*  --   --   --   --  415*  LABPROT 33.2*   < >  --  31.1* 30.5* 25.2*  INR 3.3*   < >  --  3.0* 2.9* 2.3*  CREATININE 1.13  --  1.17  --   --  0.99   < > = values in this interval not displayed.    Estimated Creatinine Clearance: 139 mL/min (by C-G formula based on SCr of 0.99 mg/dL).   Assessment: 41 yom with history of afib on warfarin PTA dosed 6 mg every day except 3 mg on Friday per med history. INR on admit 3.3, last dose 1/22 prior to admission. Per cards, plans for possible DCCV with potential for Tikosyn initiation per EP.   INR today is therapeutic at 2.3 H&H low but stable at 11.2/34, plts elevated but decreasing at 415. Patient is now s/p successful DCCV and ready to be discharged. Will order a dose of warfarin for tonight in case discharge is delayed.   Goal of Therapy:  INR 2-3 Monitor platelets by anticoagulation protocol: Yes   Plan:   Warfarin 6 mg x 1 tonight  Daily INR and CBC   Monitor for signs of bleeding Resume on home dosage regimen of 6 mg daily except 3 mg on Fridays when discharged with close follow up with [facility].    Thank you,   Eddie Candle, PharmD PGY-1 Pharmacy Resident   Please check amion for clinical pharmacist contact number 04/02/2019,10:11 AM

## 2019-04-03 ENCOUNTER — Telehealth: Payer: Self-pay | Admitting: Hematology

## 2019-04-03 LAB — GLUCOSE, CAPILLARY
Glucose-Capillary: 87 mg/dL (ref 70–99)
Glucose-Capillary: 91 mg/dL (ref 70–99)
Glucose-Capillary: 112 mg/dL — ABNORMAL HIGH (ref 70–99)

## 2019-04-03 LAB — MAGNESIUM: Magnesium: 1.7 mg/dL (ref 1.7–2.4)

## 2019-04-03 LAB — BASIC METABOLIC PANEL
Anion gap: 10 (ref 5–15)
BUN: 22 mg/dL (ref 8–23)
CO2: 27 mmol/L (ref 22–32)
Calcium: 8.9 mg/dL (ref 8.9–10.3)
Chloride: 106 mmol/L (ref 98–111)
Creatinine, Ser: 1.12 mg/dL (ref 0.61–1.24)
GFR calc Af Amer: >60 mL/min (ref >60)
GFR calc non Af Amer: >60 mL/min (ref >60)
Glucose, Bld: 107 mg/dL — ABNORMAL HIGH (ref 70–99)
Potassium: 4.4 mmol/L (ref 3.5–5.1)
Sodium: 143 mmol/L (ref 135–145)

## 2019-04-03 LAB — CBC
HCT: 30.1 % — ABNORMAL LOW (ref 39.0–52.0)
Hemoglobin: 9.8 g/dL — ABNORMAL LOW (ref 13.0–17.0)
MCH: 35.1 pg — ABNORMAL HIGH (ref 26.0–34.0)
MCHC: 32.6 g/dL (ref 30.0–36.0)
MCV: 107.9 fL — ABNORMAL HIGH (ref 80.0–100.0)
Platelets: 345 10*3/uL (ref 150–400)
RBC: 2.79 MIL/uL — ABNORMAL LOW (ref 4.22–5.81)
RDW: 16.1 % — ABNORMAL HIGH (ref 11.5–15.5)
WBC: 8.4 10*3/uL (ref 4.0–10.5)
nRBC: 0.2 % (ref 0.0–0.2)

## 2019-04-03 LAB — PROTIME-INR
INR: 1.8 — ABNORMAL HIGH (ref 0.8–1.2)
Prothrombin Time: 21 seconds — ABNORMAL HIGH (ref 11.4–15.2)

## 2019-04-03 MED ORDER — ENOXAPARIN SODIUM 120 MG/0.8ML ~~LOC~~ SOLN
120.0000 mg | Freq: Two times a day (BID) | SUBCUTANEOUS | Status: DC
  Administered 2019-04-03: 11:00:00 120 mg via SUBCUTANEOUS
  Filled 2019-04-03: qty 0.8

## 2019-04-03 MED ORDER — METOPROLOL TARTRATE 25 MG PO TABS: 25.0000 mg | ORAL_TABLET | Freq: Two times a day (BID) | ORAL | 6 refills | Status: DC

## 2019-04-03 MED ORDER — ENOXAPARIN SODIUM 120 MG/0.8ML ~~LOC~~ SOLN: 120.0000 mg | Freq: Two times a day (BID) | SUBCUTANEOUS | 0 refills | Status: DC

## 2019-04-03 MED ORDER — WARFARIN SODIUM 7.5 MG PO TABS
7.5000 mg | ORAL_TABLET | Freq: Once | ORAL | Status: AC
  Administered 2019-04-03: 11:00:00 7.5 mg via ORAL
  Filled 2019-04-03: qty 1

## 2019-04-03 MED FILL — ENOXAPARIN SODIUM 120 MG/0.: 120 | 3 days supply | Qty: 4 | Fill #0

## 2019-04-03 NOTE — Progress Notes (Signed)
   Pt feeling better this am. Up to chair with compression hose on and feeling better.   BP [id number] systolic overnight. Will repeat orthostatics for further guidance. Tentative D/C today with close follow up.   Sinus brady noted overnight s/p DCCV. Stable.   Legrand Como [address] [place], PA-C  04/03/2019 8:22 AM

## 2019-04-03 NOTE — Plan of Care (Signed)
  Problem: Education: Goal: Knowledge of General Education information will improve Description: Including pain rating scale, medication(s)/side effects and non-pharmacologic comfort measures Outcome: Progressing   Problem: Health Behavior/Discharge Planning: Goal: Ability to manage health-related needs will improve Outcome: Progressing   Problem: Safety: Goal: Ability to remain free from injury will improve Outcome: Progressing   

## 2019-04-03 NOTE — Progress Notes (Signed)
ANTICOAGULATION CONSULT NOTE - Initial Consult  Pharmacy Consult for enoxaparin/ warfarin Indication: bridge for warfarin, recent DCCV   Patient Measurements: Height: 6\' 2"  (188 cm) Weight: 260 lb 1.6 oz (118 kg) IBW/kg (Calculated) : 82.2  Vital Signs: Temp: 97.8 F (36.6 C) (01/25 0605) Temp Source: Oral (01/25 0605) BP: 133/67 (01/25 0605) Pulse Rate: 51 (01/25 0605)  Labs: Recent Labs    03/31/19 1410 03/31/19 1410 04/01/19 0545 04/01/19 1218 04/01/19 2036 04/02/19 0517 04/03/19 0430  HGB 11.5*   < >  --   --   --  11.2* 9.8*  HCT 34.0*  --   --   --   --  34.0* 30.1*  PLT 455*  --   --   --   --  415* 345  LABPROT 33.2*  --   --    < > 30.5* 25.2* 21.0*  INR 3.3*  --   --    < > 2.9* 2.3* 1.8*  CREATININE 1.13   < > 1.17  --   --  0.99 1.12   < > = values in this interval not displayed.    Estimated Creatinine Clearance: 77.8 mL/min (by C-G formula based on SCr of 1.12 mg/dL).   Assessment: 74 yo M with afib (CHADS2VASc = 3, preDM on metformin) and DCCV 04/02/19 on warfarin PTA. Pharmacy consulted to start enoxaparin given INR drop to 1.8 and to dose warfarin.  Unclear why INR dropping, no missed warfarin doses. H/H, plt drop after DCCV, no overt bleeding noted. Patient with dizziness upon standing, improved with compression stockings. Monitor H/H. Good renal function. Eating 100%.   PTA Warfarin 3mg  Fri, 6mg  all other days    Goal of Therapy:  INR 2-3 Monitor platelets by anticoagulation protocol: Yes   Plan:  Start enoxaparin 120 mg Q12hr Warfarin 7.5mg  x1 today Stop enoxaparin when INR > 2 Monitor daily INR, CBC, plt Monitor for signs/symptoms of bleeding   If discharged today: suggest enoxaparin 120mg  Q12 hr with warfarin 7.5mg  on 1/25 (has 3mg  tablets at home = 2.5 tablets)  then resume previous dose of 3mg  Friday and 6mg  all other days starting 1/26.   -Suggest INR check by 1/27 if possible to determine if can stop enoxaparin    Benetta Spar,  PharmD, BCPS, [facility] Clinical Pharmacist  Please check AMION for all [place] phone numbers After 10:00 PM, call [place]

## 2019-04-03 NOTE — Discharge Instructions (Addendum)
Enoxaparin injection °What is this medicine? °ENOXAPARIN (ee nox a PA rin) is used after knee, hip, or abdominal surgeries to prevent blood clotting. It is also used to treat existing blood clots in the lungs or in the veins. °This medicine may be used for other purposes; ask your health care provider or pharmacist if you have questions. °COMMON BRAND NAME(S): Lovenox °What should I tell my health care provider before I take this medicine? °They need to know if you have any of these conditions: °· bleeding disorders, hemorrhage, or hemophilia °· infection of the heart or heart valves °· kidney or liver disease °· previous stroke °· prosthetic heart valve °· recent surgery or delivery of a baby °· ulcer in the stomach or intestine, diverticulitis, or other bowel disease °· an unusual or allergic reaction to enoxaparin, heparin, pork or pork products, other medicines, foods, dyes, or preservatives °· pregnant or trying to get pregnant °· breast-feeding °How should I use this medicine? °This medicine is for injection under the skin. It is usually given by a health-care professional. You or a family member may be trained on how to give the injections. If you are to give yourself injections, make sure you understand how to use the syringe, measure the dose if necessary, and give the injection. To avoid bruising, do not rub the site where this medicine has been injected. Do not take your medicine more often than directed. Do not stop taking except on the advice of your doctor or health care professional. °Make sure you receive a puncture-resistant container to dispose of the needles and syringes once you have finished with them. Do not reuse these items. Return the container to your doctor or health care professional for proper disposal. °Talk to your pediatrician regarding the use of this medicine in children. Special care may be needed. °Overdosage: If you think you have taken too much of this medicine contact a poison  control center or emergency room at once. °NOTE: This medicine is only for you. Do not share this medicine with others. °What if I miss a dose? °If you miss a dose, take it as soon as you can. If it is almost time for your next dose, take only that dose. Do not take double or extra doses. °What may interact with this medicine? °· aspirin and aspirin-like medicines °· certain medicines that treat or prevent blood clots °· dipyridamole °· NSAIDs, medicines for pain and inflammation, like ibuprofen or naproxen °This list may not describe all possible interactions. Give your health care provider a list of all the medicines, herbs, non-prescription drugs, or dietary supplements you use. Also tell them if you smoke, drink alcohol, or use illegal drugs. Some items may interact with your medicine. °What should I watch for while using this medicine? °Visit your healthcare professional for regular checks on your progress. You may need blood work done while you are taking this medicine. Your condition will be monitored carefully while you are receiving this medicine. It is important not to miss any appointments. °If you are going to need surgery or other procedure, tell your healthcare professional that you are using this medicine. °Using this medicine for a long time may weaken your bones and increase the risk of bone fractures. °Avoid sports and activities that might cause injury while you are using this medicine. Severe falls or injuries can cause unseen bleeding. Be careful when using sharp tools or knives. Consider using an electric razor. Take special care brushing or flossing your   teeth. Report any injuries, bruising, or red spots on the skin to your healthcare professional. °Wear a medical ID bracelet or chain. Carry a card that describes your disease and details of your medicine and dosage times. °What side effects may I notice from receiving this medicine? °Side effects that you should report to your doctor or health  care professional as soon as possible: °· allergic reactions like skin rash, itching or hives, swelling of the face, lips, or tongue °· bone pain °· signs and symptoms of bleeding such as bloody or black, tarry stools; red or dark-brown urine; spitting up blood or brown material that looks like coffee grounds; red spots on the skin; unusual bruising or bleeding from the eye, gums, or nose °· signs and symptoms of a blood clot such as chest pain; shortness of breath; pain, swelling, or warmth in the leg °· signs and symptoms of a stroke such as changes in vision; confusion; trouble speaking or understanding; severe headaches; sudden numbness or weakness of the face, arm or leg; trouble walking; dizziness; loss of coordination °Side effects that usually do not require medical attention (report to your doctor or health care professional if they continue or are bothersome): °· hair loss °· pain, redness, or irritation at site where injected °This list may not describe all possible side effects. Call your doctor for medical advice about side effects. You may report side effects to FDA at 1-800-FDA-1088. °Where should I keep my medicine? °Keep out of the reach of children. °Store at room temperature between 15 and 30 degrees C (59 and 86 degrees F). Do not freeze. If your injections have been specially prepared, you may need to store them in the refrigerator. Ask your pharmacist. Throw away any unused medicine after the expiration date. °NOTE: This sheet is a summary. It may not cover all possible information. If you have questions about this medicine, talk to your doctor, pharmacist, or health care provider. °© 2020 [employer] (2017-02-18 11:25:34) ° °

## 2019-04-03 NOTE — Progress Notes (Signed)
Pt is currently sustaining a heart rate of 50 dropping as low as 47 pt is asymptomatic and pt is asleep with theses heart rates  MD notified  Will continue to monitor

## 2019-04-03 NOTE — Telephone Encounter (Signed)
A new hem appt has been scheduled for [name] to see Dr. Irene Limbo on 2/9 at 10am. I sent a msg to the referring office to notify the pt with the appt date and time.

## 2019-04-04 ENCOUNTER — Ambulatory Visit (INDEPENDENT_AMBULATORY_CARE_PROVIDER_SITE_OTHER): Payer: Medicare Other | Admitting: Pharmacist

## 2019-04-04 ENCOUNTER — Telehealth: Payer: Self-pay | Admitting: *Deleted

## 2019-04-04 ENCOUNTER — Other Ambulatory Visit: Payer: Self-pay

## 2019-04-04 ENCOUNTER — Encounter: Payer: Self-pay | Admitting: *Deleted

## 2019-04-04 DIAGNOSIS — Z86718 Personal history of other venous thrombosis and embolism: Secondary | ICD-10-CM

## 2019-04-04 DIAGNOSIS — Z7901 Long term (current) use of anticoagulants: Secondary | ICD-10-CM | POA: Diagnosis not present

## 2019-04-04 DIAGNOSIS — I480 Paroxysmal atrial fibrillation: Secondary | ICD-10-CM

## 2019-04-04 LAB — POCT INR: INR: 2 (ref 2.0–3.0)

## 2019-04-04 NOTE — Telephone Encounter (Signed)
Pt was on TCM report admitted 03/31/19 for symptomatic atrial fibrillation with controlled ventricular rate. Pt evaluated by Dr. Lovena Le for EP.  He had previously been on Sotalol for control of his atrial fibrillation.  The dose was reduced 2/2 prolonged QT and it was ultimately DC'd after his PVI ablation. Pt D/C 04/03/19 and will follow-up w/ Minus Breeding, MD Follow up on 04/14/2019.   Specialty: Cardiology Why: at 140 pm for post hospital follow up

## 2019-04-06 ENCOUNTER — Telehealth: Payer: Self-pay | Admitting: Cardiology

## 2019-04-06 ENCOUNTER — Encounter: Payer: Self-pay | Admitting: Internal Medicine

## 2019-04-06 NOTE — Telephone Encounter (Signed)
Please advise- patient is to have a steroid injection tomorrow morning, they put him to sleep and would like to know if this is okay to do on  his current medications and issues.  Advised I would send a message to MD and nurse to be aware and give recommendations.

## 2019-04-06 NOTE — Telephone Encounter (Signed)
New Message    Pts wife is calling and says the pt is having an Injection in his back tomorrow morning at the surgical center  She is wondering if this is okay     Please call

## 2019-04-07 NOTE — Telephone Encounter (Signed)
Left message for patient to call back. Dr. Percival Spanish does not recommend the injection into patient's back due to blood thinner.

## 2019-04-07 NOTE — Telephone Encounter (Signed)
Spoke with patient's wife. Informed her Dr. Percival Spanish does not recommend the injection at this time due to blood thinner. Understanding verbalized.

## 2019-04-09 NOTE — Progress Notes (Signed)
Subjective:    Patient ID: [name] [name], male    DOB: 06/16/1945, 74 y.o.   MRN: [id number]  HPI The patient is here for follow up from the hospital   Admitted 03/31/19 - 04/03/19 for Afib  He went to the ED with 3-4 days of significant DOE, dizziness when he attempted to stand. His home device showed Afib.  EKG in ED showed Afib at 80 bpm.  He was evaluated by Dr Lovena Le.  Initially he was restarted on sotalol, but that was stopped and he had a cardioversion and was back in sinus rhythm. He stayed an extra day for dizziness and orthostatic hypotension. His metoprolol dose was decreased.  It was advised he be worked up for amyloidosis as an outpatient.    He is taking his medication as prescribed. He feels 60-70% better.  He was told he likely had Afib from low BP with standing.  Advised to wear compression socks.  Has to sit during the day with feet on ground.  Needs to have bed elevated at head.  He has follow up with cardiology on Friday.  He does not get tired, winded or dizzy when he gets up and walks.   Medications and allergies reviewed with patient and updated if appropriate.  Patient Active Problem List   Diagnosis Date Noted  . Acute diastolic congestive heart failure; exacerbated by recurrent atrial fibrillation  04/02/2019  . Atrial fibrillation ([place]) 03/31/2019  . Chronic diastolic HF (heart failure) ([place]) 12/05/2018  . Accelerating angina ([place]) 08/15/2018  . Olecranon bursitis of right elbow 07/25/2018  . Non-rheumatic mitral regurgitation 04/16/2017  . Bilateral leg edema 12/26/2016  . Anxiety 12/26/2016  . Scar tissue 11/02/2016  . Plantar fasciitis 10/06/2016  . Vitamin D deficiency 07/21/2016  . OSA (obstructive sleep apnea) 11/14/2015  . Prediabetes 04/17/2015  . COPD GOLD II 08/21/2014  . Hyperlipidemia 08/20/2014  . Spinal stenosis of lumbar region 05/30/2014  . Abnormal CT of the abdomen 05/30/2014  . Dizziness 02/08/2014  . Macrocytic anemia 08/08/2013   . CAD (coronary artery disease)   . AAA (abdominal aortic aneurysm) without rupture ([place]) 05/02/2013  . Obesity (BMI 30-39.9) 03/21/2013  . Family history of malignant neoplasm of gastrointestinal tract 08/25/2011  . 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 Fatigue 03/24/2011  . DOE (dyspnea on exertion) 03/12/2011  . Warfarin anticoagulation 03/11/2011  . INSOMNIA-SLEEP DISORDER-UNSPEC 09/12/2009  . CERVICAL RADICULOPATHY, RIGHT 07/24/2009  . AVASCULAR NECROSIS 05/21/2009  . ALLERGIC RHINITIS CAUSE UNSPECIFIED 11/01/2008  . PULMONARY NODULE, RIGHT LOWER LOBE 10/25/2008  . DIVERTICULOSIS, COLON 10/07/2008  . 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 PULMONARY EMBOLISM, HX OF 10/05/2008  . ASTHMA 02/20/2008  . HYPERPLASIA PROSTATE UNS W/UR OBST & OTH LUTS 06/28/2007  . PAF (paroxysmal atrial fibrillation) ([employer]) 06/25/2007  . COLONIC POLYPS 04/01/2006  . GOUT 04/01/2006  . Essential hypertension 04/01/2006  . ACID REFLUX DISEASE 04/01/2006    Current Outpatient Medications on File Prior to Visit  Medication Sig Dispense Refill  . albuterol (VENTOLIN HFA) 108 (90 Base) MCG/ACT inhaler Inhale 2 puffs into the lungs every 6 (six) hours as needed for wheezing or shortness of breath. 6.7 g 1  . aspirin 81 MG chewable tablet Chew 81 mg by mouth daily.     [name] atorvastatin (LIPITOR) 10 MG tablet Take 10 mg by mouth daily.    . Cholecalciferol (VITAMIN D3 PO) Take 1 capsule by mouth daily.    [name] enoxaparin (LOVENOX) 120 MG/0.8ML injection  Inject 0.8 mLs (120 mg total) into the skin every 12 (twelve) hours. 4 mL 0  . Eszopiclone 3 MG TABS Take 3 mg by mouth at bedtime. Take immediately before bedtime    . Fluticasone-Umeclidin-Vilant (TRELEGY ELLIPTA) 100-62.5-25 MCG/INH AEPB Inhale 1 puff into the lungs daily.    . metFORMIN (GLUCOPHAGE) 500 MG tablet Take 1 tablet (500 mg total) by mouth 2 (two) times daily with a meal. 180 tablet 1  . metoprolol tartrate (LOPRESSOR) 25 MG  tablet Take 1 tablet (25 mg total) by mouth 2 (two) times daily. 60 tablet 6  . nitroGLYCERIN (NITROSTAT) 0.4 MG SL tablet Place 1 tablet (0.4 mg total) under the tongue every 5 (five) minutes as needed for chest pain (up to 3 doses). 25 tablet 4  . pantoprazole (PROTONIX) 40 MG tablet TAKE 1 TABLET DAILY (Patient taking differently: Take 40 mg by mouth daily. ) 90 tablet 3  . traZODone (DESYREL) 50 MG tablet Take 50 mg by mouth at bedtime.    [name] venlafaxine XR (EFFEXOR-XR) 75 MG 24 hr capsule Take 75 mg by mouth daily with breakfast.    . verapamil (CALAN-SR) 120 MG CR tablet Take 1 tablet (120 mg total) by mouth at bedtime. 90 tablet 3  . vitamin B-12 (CYANOCOBALAMIN) 1000 MCG tablet Take 1,000 mcg by mouth daily.    [name] warfarin (COUMADIN) 3 MG tablet Take 1-2 tablets (3-6 mg total) by mouth See admin instructions. 6mg  everyday except on Friday take 3mg      No current facility-administered medications on file prior to visit.    Past Medical History:  Diagnosis Date  . Arthritis   . Asthma   . BPH (benign prostatic hypertrophy)   . CAD (coronary artery disease)    a. 07/2013: s/p DES to LAD, normal LVF.  [name] Complication of anesthesia    "I got all kinds of hallucinations"  . COPD (chronic obstructive pulmonary disease) ([place])    a. 07/2013 PFT's mild airflow obstruction, no restriction, sev decrease in DLCO.  . Diabetes mellitus without complication ([place])   . Diverticulosis   . DVT (deep venous thrombosis) ([place])    a. 2010 Lower ext s/p back surgery.  [name] Dyspnea on exertion    a. 07/2013 PFT's mild airflow obstr   . GERD (gastroesophageal reflux disease)   . History of gout   . History of hiatal hernia   . Hx of echocardiogram 2015   Echo (06/2013): EF 60-65% normal wall motion, normal diastolic function, aortic sclerosis without stenosis, Trivial MR, mild SAM due to long, redundant mitral leaflets, mild RAE, normal RVSF  . Hypertension   . Macrocytic anemia    a. 07/2013: documented on  prior labs.  . Obesity   . OSA (obstructive sleep apnea) 11/14/2015  . PAF (paroxysmal atrial fibrillation) ([place])    a. Flecainide discontinued 07/2013 in setting of CAD.; b. s/p PVI Ablation at [facility] (Dr Joseph Berkshire) 11/2014  . Pneumonia ~ 2010 X 1  . Pulmonary embolism ([place])    a. 2010 in setting of DVT post-op back surgery. b. Low prob VQ 07/2013.  [name] Sleep apnea    on cpap  . Spinal stenosis    Cong[place]  . Transaminitis    a. 07/2013: mild.  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 ANTERIOR LUMBAR [place] ARTHROPLASTY  03/2008   "spacer poped out; had to repair"  . CARDIOVERSION N/A 04/02/2019   Procedure: CARDIOVERSION;  Surgeon: Deboraha Sprang, MD;  Location[facility] OR;  Service: Cardiovascular;  Laterality: N/A;  . CATARACT EXTRACTION W/ INTRAOCULAR LENS  IMPLANT, BILATERAL Bilateral   . COLONOSCOPY    . COLONOSCOPY W/ POLYPECTOMY  2004  . Colonoscopy with polypectomy  09/2011   2 tubular adenomas  . CORONARY ANGIOPLASTY WITH STENT PLACEMENT  08/08/2013   "1"  . JOINT REPLACEMENT    . LEFT AND RIGHT HEART CATHETERIZATION WITH CORONARY ANGIOGRAM N/A 08/07/2013   Procedure: LEFT AND RIGHT HEART CATHETERIZATION WITH CORONARY ANGIOGRAM;  Surgeon: Peter M Martinique, MD;  Location: [facility] CATH LAB;  Service: Cardiovascular;  Laterality: N/A;  . LUMBAR FUSION  02/2008  . PERCUTANEOUS CORONARY STENT INTERVENTION (PCI-S)  08/07/2013   Procedure: PERCUTANEOUS CORONARY STENT INTERVENTION (PCI-S);  Surgeon: Peter M Martinique, MD;  Location: [facility] CATH LAB;  Service: Cardiovascular;;  . PVI ablation  11/2014   Dr. Venita Sheffield [facility]  . RIGHT/LEFT HEART CATH AND CORONARY ANGIOGRAPHY N/A 08/15/2018   Procedure: RIGHT/LEFT HEART CATH AND CORONARY ANGIOGRAPHY;  Surgeon: Nelva Bush, MD;  Location: [place] CV LAB;  Service: Cardiovascular;  Laterality: N/A;  . TOTAL HIP ARTHROPLASTY Bilateral   . VASECTOMY      Social History    Socioeconomic History  . Marital status: Married    Spouse name: Not on file  . Number of children: 4  . Years of education: Not on file  . Highest education level: Not on file  Occupational History  . Occupation: Retired from [employer]    Employer: RETIRED  Tobacco Use  . Smoking status: Former Smoker    Packs/day: 2.00    Years: 40.00    Pack years: 80.00    Types: Cigarettes    Quit date: 03/09/2000    Years since quitting: 19.0  . Smokeless tobacco: Never Used  . Tobacco comment: smoked 1964-2002, up to 3 ppd  Substance and Sexual Activity  . Alcohol use: Yes    Alcohol/week: 2.0 - 3.0 standard drinks    Types: 2 - 3 Cans of beer per week    Comment: drinks beer several times weekly  . Drug use: No  . Sexual activity: Never    Comment: "sexually hx is none of your business" (02/08/2014)  Other Topics Concern  . Not on file  Social History Narrative  . Not on file   Social Determinants of Health   Financial Resource Strain:   . Difficulty of Paying Living Expenses: Not on file  Food Insecurity:   . Worried About Charity fundraiser in the Last Year: Not on file  . Ran Out of Food in the Last Year: Not on file  Transportation Needs:   . Lack of Transportation (Medical): Not on file  . Lack of Transportation (Non-Medical): Not on file  Physical Activity:   . Days of Exercise per Week: Not on file  . Minutes of Exercise per Session: Not on file  Stress:   . Feeling of Stress : Not on file  Social Connections:   . Frequency of Communication with Friends and Family: Not on file  . Frequency of Social Gatherings with Friends and Family: Not on file  . Attends Religious Services: Not on file  . Active Member of Clubs or Organizations: Not on file  . Attends Archivist Meetings: Not on file  . Marital Status: Not on file    Family History  Problem Relation Age of Onset  . Heart attack Paternal Grandfather  Over 2  . Atrial fibrillation Mother   .  COPD Mother   . Heart disease Father   . Colon cancer Father   . Heart attack Brother 26  . Heart disease Brother   . Alcohol abuse Brother 81       Died post liver transplant  . Heart attack Paternal Uncle        Less than 37  . Stroke Neg Hx   . Rectal cancer Neg Hx   . Stomach cancer Neg Hx   . Esophageal cancer Neg Hx     Review of Systems  Constitutional: Negative for chills and fever.  Respiratory: Positive for shortness of breath (with exertion). Negative for cough and wheezing.   Cardiovascular: Negative for chest pain, palpitations and leg swelling.  Neurological: Negative for dizziness, light-headedness and headaches.       Objective:   Vitals:   04/10/19 1413  BP: (!) 152/74  Pulse: (!) 54  Resp: (!) 22  Temp: 98.7 F (37.1 C)  SpO2: 97%   BP Readings from Last 3 Encounters:  04/10/19 (!) 152/74  04/03/19 133/67  03/31/19 (!) 114/54   Wt Readings from Last 3 Encounters:  04/10/19 268 lb 12.8 oz (121.9 kg)  04/03/19 260 lb 1.6 oz (118 kg)  03/31/19 246 lb (111.6 kg)   Body mass index is 34.51 kg/m.   Physical Exam    Constitutional: Appears well-developed and well-nourished. No distress.  HENT:  Head: Normocephalic and atraumatic.  Neck: Neck supple. No tracheal deviation present. No thyromegaly present.  No cervical lymphadenopathy Cardiovascular: Normal rate, regular rhythm and normal heart sounds.   No murmur heard. No carotid bruit .  No edema Pulmonary/Chest: Effort normal and breath sounds normal. No respiratory distress. No has no wheezes. No rales.  Skin: Skin is warm and dry. Not diaphoretic.  Psychiatric: Normal mood and affect. Behavior is normal.      Assessment & Plan:    See Problem List for Assessment and Plan of chronic medical problems.    This visit occurred during the SARS-CoV-2 public health emergency.  Safety protocols were in place, including screening questions prior to the visit, additional usage of staff PPE, and  extensive cleaning of exam room while observing appropriate contact time as indicated for disinfecting solutions.

## 2019-04-10 ENCOUNTER — Ambulatory Visit (INDEPENDENT_AMBULATORY_CARE_PROVIDER_SITE_OTHER): Payer: Medicare Other | Admitting: Internal Medicine

## 2019-04-10 ENCOUNTER — Encounter: Payer: Self-pay | Admitting: Internal Medicine

## 2019-04-10 ENCOUNTER — Other Ambulatory Visit: Payer: Self-pay

## 2019-04-10 DIAGNOSIS — I480 Paroxysmal atrial fibrillation: Secondary | ICD-10-CM | POA: Diagnosis not present

## 2019-04-10 DIAGNOSIS — I2510 Atherosclerotic heart disease of native coronary artery without angina pectoris: Secondary | ICD-10-CM

## 2019-04-10 DIAGNOSIS — I10 Essential (primary) hypertension: Secondary | ICD-10-CM | POA: Diagnosis not present

## 2019-04-10 MED ORDER — COLCHICINE 0.6 MG PO TABS: ORAL_TABLET | ORAL | 3 refills | Status: DC

## 2019-04-10 NOTE — Assessment & Plan Note (Signed)
BP slightly elevated - metoprolol dose was decreased in hospital Will not make any changes until he sees cardio  - need to avoid orthostatic hypotension which is triggering the Afib - ? pots like syndrome No change in medications

## 2019-04-10 NOTE — Patient Instructions (Signed)
     Medications reviewed and updated.  Changes include :   none       

## 2019-04-10 NOTE — Assessment & Plan Note (Signed)
Afib caused to orthostatic hypotension Metoprolol dose decreased S/p cardioversion Feels much better Follow up with cardiology on Friday

## 2019-04-11 ENCOUNTER — Other Ambulatory Visit: Payer: Self-pay | Admitting: Internal Medicine

## 2019-04-12 NOTE — Telephone Encounter (Signed)
Check [place] registry last filled 03/06/2019.Marland KitchenJohny Chess

## 2019-04-13 NOTE — Progress Notes (Signed)
Cardiology Office Note   Date:  04/14/2019   ID:  [name] [name], DOB [birth year], MRN [id number]  PCP:  Binnie Rail, MD  Cardiologist:   Minus Breeding, MD   Chief Complaint  Patient presents with  . Shortness of Breath      History of Present Illness: [name] [name] is a 74 y.o. male who presents for of CAD s/p PCI/DES to LAD 07/2013, atrial fibrillations/pablation at the [facility] 2016but remainson coumadin due to ongoing paroxysmal atrial fibrillation, hypertension, AAA, DVT with PE(provoked following surgery ~18yr ago), along with OSA and COPD followed by Dr.Sood.He had a cardiac cath In June 2020.  He had a patent stent in the LAD had mild diffuse disease elsewhere.  He traveled back up to [place] where he saw his cardiologist up there.  Because he is monitor that he wore to you earlier this year demonstrated some brief runs of SVT and could not exclude atrial fibrillation he wanted to be seen.  I did not think he had any episodes of fibrillation but because of ongoing symptoms I suggested to him that he might need stress echocardiography.   I spoke with the cardiologist and followed up with a dobutamine stress echo to look for an inducible gradient across the aortic outflow tract, SAM and elevated pulmonary pressures.   There was no clear evidence of SAM.  Unfortunately the pulmonary pressures were not able to be measured.  However, there was an increased ventricular gradient with dobutamine that was 100 mm Hg although it was not entirely clear whether the gradient was subvalvular or mid cavity.   I subsequently increased his verapamil.  He did not have improvement and I sent him to Dr. Halford Chessman for a pulmonary evaluation. He had a CPX which suggested that his limitation was possibly weight and deconditioning but could also be related to diastolic dysfunction.    He had increased breathless when I last saw him and was in flutter.  We admitted him to the hospital and  he had DCCV.   He was very orthostatic on discharge and had compressions stockings placed.  He had his beta blocker reduced.  Since going home he has felt much better.  He is actually better than he was prior to presentation and when he thinks he had the onset of his fibrillation.  He thinks this was 3 to 5 days prior to his office visit.  He is now able to walk down to the mailbox and only had to stop 1 or 2 times and he was having to stop 3-5 times.  He is not having any significant presyncope or orthostatic symptoms.  He is not having any new palpitations.  He is not having any chest pressure, neck or arm discomfort.  He is wearing his compression stockings.   Past Medical History:  Diagnosis Date  . Arthritis   . Asthma   . BPH (benign prostatic hypertrophy)   . CAD (coronary artery disease)    a. 07/2013: s/p DES to LAD, normal LVF.  [name] Complication of anesthesia    "I got all kinds of hallucinations"  . COPD (chronic obstructive pulmonary disease) ([place])    a. 07/2013 PFT's mild airflow obstruction, no restriction, sev decrease in DLCO.  . Diabetes mellitus without complication ([place])   . Diverticulosis   . DVT (deep venous thrombosis) ([place])    a. 2010 Lower ext s/p back surgery.  [name] Dyspnea on exertion    a. 07/2013 PFT's  mild airflow obstr   . GERD (gastroesophageal reflux disease)   . History of gout   . History of hiatal hernia   . Hx of echocardiogram 2015   Echo (06/2013): EF 60-65% normal wall motion, normal diastolic function, aortic sclerosis without stenosis, Trivial MR, mild SAM due to long, redundant mitral leaflets, mild RAE, normal RVSF  . Hypertension   . Macrocytic anemia    a. 07/2013: documented on prior labs.  . Obesity   . OSA (obstructive sleep apnea) 11/14/2015  . PAF (paroxysmal atrial fibrillation) ([place])    a. Flecainide discontinued 07/2013 in setting of CAD.; b. s/p PVI Ablation at [facility] (Dr Joseph Berkshire) 11/2014  . Pneumonia ~ 2010 X 1  . Pulmonary embolism  ([place])    a. 2010 in setting of DVT post-op back surgery. b. Low prob VQ 07/2013.  [name] Sleep apnea    on cpap  . Spinal stenosis    Congential  . Transaminitis    a. 07/2013: mild.  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 ANTERIOR LUMBAR [place] ARTHROPLASTY  03/2008   "spacer poped out; had to repair"  . CARDIOVERSION N/A 04/02/2019   Procedure: CARDIOVERSION;  Surgeon: Deboraha Sprang, MD;  Location: [facility] OR;  Service: Cardiovascular;  Laterality: N/A;  . CATARACT EXTRACTION W/ INTRAOCULAR LENS  IMPLANT, BILATERAL Bilateral   . COLONOSCOPY    . COLONOSCOPY W/ POLYPECTOMY  2004  . Colonoscopy with polypectomy  09/2011   2 tubular adenomas  . CORONARY ANGIOPLASTY WITH STENT PLACEMENT  08/08/2013   "1"  . JOINT REPLACEMENT    . LEFT AND RIGHT HEART CATHETERIZATION WITH CORONARY ANGIOGRAM N/A 08/07/2013   Procedure: LEFT AND RIGHT HEART CATHETERIZATION WITH CORONARY ANGIOGRAM;  Surgeon: Peter M Martinique, MD;  Location[facility] CATH LAB;  Service: Cardiovascular;  Laterality: N/A;  . LUMBAR FUSION  02/2008  . PERCUTANEOUS CORONARY STENT INTERVENTION (PCI-S)  08/07/2013   Procedure: PERCUTANEOUS CORONARY STENT INTERVENTION (PCI-S);  Surgeon: Peter M Martinique, MD;  Location: [facility] CATH LAB;  Service: Cardiovascular;;  . PVI ablation  11/2014   Dr. Venita Sheffield [facility]  . RIGHT/LEFT HEART CATH AND CORONARY ANGIOGRAPHY N/A 08/15/2018   Procedure: RIGHT/LEFT HEART CATH AND CORONARY ANGIOGRAPHY;  Surgeon: Nelva Bush, MD;  Location: [name] CV LAB;  Service: Cardiovascular;  Laterality: N/A;  . TOTAL HIP ARTHROPLASTY Bilateral   . VASECTOMY       Current Outpatient Medications  Medication Sig Dispense Refill  . albuterol (VENTOLIN HFA) 108 (90 Base) MCG/ACT inhaler Inhale 2 puffs into the lungs every 6 (six) hours as needed for wheezing or shortness of breath. 6.7 g 1  . aspirin 81 MG chewable tablet Chew 81 mg by mouth daily.     [name]  atorvastatin (LIPITOR) 10 MG tablet Take 10 mg by mouth daily.    . Cholecalciferol (VITAMIN D3 PO) Take 1 capsule by mouth daily.    . colchicine 0.6 MG tablet Take 2 tabs po x 1 then one hour later take 1 tab po x 1 9 tablet 3  . enoxaparin (LOVENOX) 120 MG/0.8ML injection Inject 0.8 mLs (120 mg total) into the skin every 12 (twelve) hours. 4 mL 0  . eszopiclone (LUNESTA) 2 MG TABS tablet TAKE 1 TABLET BY MOUTH EACH NIGHT AT BEDTIME AS NEEDED FOR SLEEP. TAKE IMMEDIATELY BEFORE BEDTIME 30 tablet 2  . Eszopiclone 3 MG TABS Take 3 mg by mouth at bedtime.  Take immediately before bedtime    . Fluticasone-Umeclidin-Vilant (TRELEGY ELLIPTA) 100-62.5-25 MCG/INH AEPB Inhale 1 puff into the lungs daily.    . metFORMIN (GLUCOPHAGE) 500 MG tablet Take 1 tablet (500 mg total) by mouth 2 (two) times daily with a meal. 180 tablet 1  . metoprolol tartrate (LOPRESSOR) 25 MG tablet Take 1 tablet (25 mg total) by mouth 2 (two) times daily. 180 tablet 3  . nitroGLYCERIN (NITROSTAT) 0.4 MG SL tablet Place 1 tablet (0.4 mg total) under the tongue every 5 (five) minutes as needed for chest pain (up to 3 doses). 25 tablet 4  . pantoprazole (PROTONIX) 40 MG tablet TAKE 1 TABLET DAILY (Patient taking differently: Take 40 mg by mouth daily. ) 90 tablet 3  . traZODone (DESYREL) 50 MG tablet Take 50 mg by mouth at bedtime.    [name] venlafaxine XR (EFFEXOR-XR) 75 MG 24 hr capsule Take 75 mg by mouth daily with breakfast.    . verapamil (CALAN-SR) 120 MG CR tablet Take 1 tablet (120 mg total) by mouth at bedtime. 90 tablet 3  . vitamin B-12 (CYANOCOBALAMIN) 1000 MCG tablet Take 1,000 mcg by mouth daily.    [name] warfarin (COUMADIN) 3 MG tablet Take 1-2 tablets (3-6 mg total) by mouth See admin instructions. 6mg  everyday except on Friday take 3mg      No current facility-administered medications for this visit.    Allergies:   Penicillins     ROS:  Please see the history of present illness.   Otherwise, review of systems are  positive none.   All other systems are reviewed and negative.    PHYSICAL EXAM: VS:  BP 130/72   Pulse 62   Ht 6\' 2"  (1.88 m)   Wt 267 lb 9.6 oz (121.4 kg)   SpO2 96%   BMI 34.36 kg/m  , BMI Body mass index is 34.36 kg/m. GENERAL:  Well appearing NECK:  No jugular venous distention, waveform within normal limits, carotid upstroke brisk and symmetric, no bruits, no thyromegaly LUNGS:  Clear to auscultation bilaterally CHEST:  Unremarkable HEART:  PMI not displaced or sustained,S1 and S2 within normal limits, no S3, no S4, no clicks, no rubs, no murmurs ABD:  Flat, positive bowel sounds normal in frequency in pitch, no bruits, no rebound, no guarding, no midline pulsatile mass, no hepatomegaly, no splenomegaly EXT:  2 plus pulses throughout, no edema, no cyanosis no clubbing   EKG:  EKG is  ordered today. Sinus rhythm, rate 60, axis within normal limits, intervals within normal limits, no acute ST-T wave changes.   Recent Labs: 03/22/2019: ALT 28; TSH 2.01 03/31/2019: B Natriuretic Peptide 363.7 04/03/2019: BUN 22; Creatinine, Ser 1.12; Hemoglobin 9.8; Magnesium 1.7; Platelets 345; Potassium 4.4; Sodium 143    Lipid Panel    Component Value Date/Time   CHOL 96 03/22/2019 1416   TRIG 118.0 03/22/2019 1416   HDL 32.50 (L) 03/22/2019 1416   CHOLHDL 3 03/22/2019 1416   VLDL 23.6 03/22/2019 1416   LDLCALC 40 03/22/2019 1416      Wt Readings from Last 3 Encounters:  04/14/19 267 lb 9.6 oz (121.4 kg)  04/10/19 268 lb 12.8 oz (121.9 kg)  04/03/19 260 lb 1.6 oz (118 kg)      Other studies Reviewed: Additional studies/ records that were reviewed today include: Hospital records extensively reviewed for this visit.  Review of the above records demonstrates:  Please see elsewhere in the note.     ASSESSMENT AND PLAN:  ATRIAL FIBRILLATION:Mr.[name] Reichmann  S McCarthyhas a CHA2DS2 - VASc score of 3.    He is now status post cardioversion.  The decision was made to not start him on  sotalol.  I did discuss his case with Dr. Caryl Comes when he was in the hospital.  DYSPNEA:  He seems to be improved.  For now getting continue the meds as listed.  However, if he has increasing dyspnea in the future I would plan for him to have an exercise right heart cath.   For now no change in therapy.  Continue his increased activity.  [id number] is 3.1 cm.   I will follow this up in a couple years.** No change in therapy.   CAD: He had a previous patent stent in his LAD and mild disease in his right coronary artery.  He has had no anginal symptoms since then.  No change in therapy.  HTN:  He has been hypotensive.  However, he seems to be doing better with the compression stockings.  I do not see an indication for midodrine.  No change in therapy.   Of note we did check his blood pressures today and he was not profoundly orthostatic though he had a slight blood pressure drop of about 13 mmHg between lying and sitting he recovered quickly.  MR:  He had no inducible SAM with dobutamine.   I will consider exercise right heart cath as above.  SLEEP APNEA:    This is followed by Dr. Halford Chessman   This is followed by Dr. Halford Chessman.   COVID EDUCATION: He has had dose one of the vaccine.  Current medicines are reviewed at length with the patient today.  The patient does not have concerns regarding medicines.  The following changes have been made:  None  Labs/ tests ordered today include: None  No orders of the defined types were placed in this encounter.    Disposition:   FU with me in 3 months.     Signed, Minus Breeding, MD  04/14/2019 2:26 PM    [place]

## 2019-04-14 ENCOUNTER — Other Ambulatory Visit: Payer: Self-pay

## 2019-04-14 ENCOUNTER — Ambulatory Visit (INDEPENDENT_AMBULATORY_CARE_PROVIDER_SITE_OTHER): Payer: Medicare Other | Admitting: Pharmacist

## 2019-04-14 ENCOUNTER — Ambulatory Visit (INDEPENDENT_AMBULATORY_CARE_PROVIDER_SITE_OTHER): Payer: Medicare Other | Admitting: Cardiology

## 2019-04-14 ENCOUNTER — Encounter: Payer: Self-pay | Admitting: Cardiology

## 2019-04-14 VITALS — BP 130/72 | HR 62 | Ht 74.0 in | Wt 267.6 lb

## 2019-04-14 DIAGNOSIS — I10 Essential (primary) hypertension: Secondary | ICD-10-CM | POA: Diagnosis not present

## 2019-04-14 DIAGNOSIS — Z7901 Long term (current) use of anticoagulants: Secondary | ICD-10-CM

## 2019-04-14 DIAGNOSIS — R0602 Shortness of breath: Secondary | ICD-10-CM

## 2019-04-14 DIAGNOSIS — I480 Paroxysmal atrial fibrillation: Secondary | ICD-10-CM | POA: Diagnosis not present

## 2019-04-14 DIAGNOSIS — I714 Abdominal aortic aneurysm, without rupture, unspecified: Secondary | ICD-10-CM

## 2019-04-14 DIAGNOSIS — Z86718 Personal history of other venous thrombosis and embolism: Secondary | ICD-10-CM

## 2019-04-14 DIAGNOSIS — Z7189 Other specified counseling: Secondary | ICD-10-CM

## 2019-04-14 DIAGNOSIS — I4819 Other persistent atrial fibrillation: Secondary | ICD-10-CM | POA: Diagnosis not present

## 2019-04-14 DIAGNOSIS — I2510 Atherosclerotic heart disease of native coronary artery without angina pectoris: Secondary | ICD-10-CM | POA: Diagnosis not present

## 2019-04-14 LAB — POCT INR: INR: 2.1 (ref 2.0–3.0)

## 2019-04-14 MED ORDER — METOPROLOL TARTRATE 25 MG PO TABS: 25.0000 mg | ORAL_TABLET | Freq: Two times a day (BID) | ORAL | 3 refills | Status: AC

## 2019-04-14 MED ORDER — VERAPAMIL HCL ER 120 MG PO TBCR: 120.0000 mg | EXTENDED_RELEASE_TABLET | Freq: Every day | ORAL | 3 refills | Status: AC

## 2019-04-14 NOTE — Patient Instructions (Signed)
Medication Instructions:  No changes *If you need a refill on your cardiac medications before your next appointment, please call your pharmacy*  Lab Work: None  Testing/Procedures: None  Follow-Up: At [facility], you and your health needs are our priority.  As part of our continuing mission to provide you with exceptional heart care, we have created designated Provider Care Teams.  These Care Teams include your primary Cardiologist (physician) and Advanced Practice Providers (APPs -  Physician Assistants and Nurse Practitioners) who all work together to provide you with the care you need, when you need it.  Your next appointment:   3 month(s)  The format for your next appointment:   In Person  Provider:   Minus Breeding, MD

## 2019-04-18 ENCOUNTER — Ambulatory Visit: Payer: Medicare Other

## 2019-04-18 ENCOUNTER — Inpatient Hospital Stay: Payer: Medicare Other | Admitting: Hematology

## 2019-04-18 NOTE — Addendum Note (Signed)
Addended by: Hinton Dyer on: 04/18/2019 04:13 PM   Modules accepted: Orders

## 2019-04-27 ENCOUNTER — Other Ambulatory Visit: Payer: Self-pay | Admitting: Family Medicine

## 2019-05-05 DIAGNOSIS — M5137 Other intervertebral disc degeneration, lumbosacral region: Secondary | ICD-10-CM | POA: Diagnosis not present

## 2019-05-18 DIAGNOSIS — M961 Postlaminectomy syndrome, not elsewhere classified: Secondary | ICD-10-CM | POA: Diagnosis not present

## 2019-05-18 DIAGNOSIS — I4891 Unspecified atrial fibrillation: Secondary | ICD-10-CM | POA: Diagnosis not present

## 2019-05-18 DIAGNOSIS — D6869 Other thrombophilia: Secondary | ICD-10-CM | POA: Diagnosis not present

## 2019-05-18 DIAGNOSIS — M5136 Other intervertebral disc degeneration, lumbar region: Secondary | ICD-10-CM | POA: Diagnosis not present

## 2019-05-23 ENCOUNTER — Other Ambulatory Visit: Payer: Self-pay | Admitting: Cardiology

## 2019-05-26 ENCOUNTER — Other Ambulatory Visit: Payer: Self-pay

## 2019-05-26 ENCOUNTER — Ambulatory Visit (INDEPENDENT_AMBULATORY_CARE_PROVIDER_SITE_OTHER): Payer: Medicare Other | Admitting: Pharmacist

## 2019-05-26 DIAGNOSIS — Z7901 Long term (current) use of anticoagulants: Secondary | ICD-10-CM

## 2019-05-26 DIAGNOSIS — I480 Paroxysmal atrial fibrillation: Secondary | ICD-10-CM | POA: Diagnosis not present

## 2019-05-26 DIAGNOSIS — Z86718 Personal history of other venous thrombosis and embolism: Secondary | ICD-10-CM | POA: Diagnosis not present

## 2019-05-26 LAB — POCT INR: INR: 3 (ref 2.0–3.0)

## 2019-05-31 NOTE — Progress Notes (Deleted)
Subjective:    Patient ID: [name] [name], male    DOB: 08/26/1945, 74 y.o.   MRN: [id number]  HPI The patient is here for an acute visit.   Dizziness:    Medications and allergies reviewed with patient and updated if appropriate.  Patient Active Problem List   Diagnosis Date Noted  . Acute diastolic congestive heart failure; exacerbated by recurrent atrial fibrillation  04/02/2019  . Atrial fibrillation ([place]) 03/31/2019  . Chronic diastolic HF (heart failure) ([place]) 12/05/2018  . Accelerating angina ([place]) 08/15/2018  . Olecranon bursitis of right elbow 07/25/2018  . Non-rheumatic mitral regurgitation 04/16/2017  . Bilateral leg edema 12/26/2016  . Anxiety 12/26/2016  . Scar tissue 11/02/2016  . Plantar fasciitis 10/06/2016  . Vitamin D deficiency 07/21/2016  . OSA (obstructive sleep apnea) 11/14/2015  . Prediabetes 04/17/2015  . COPD GOLD II 08/21/2014  . Hyperlipidemia 08/20/2014  . Spinal stenosis of lumbar region 05/30/2014  . Abnormal CT of the abdomen 05/30/2014  . Dizziness 02/08/2014  . CAD (coronary artery disease)   . AAA (abdominal aortic aneurysm) without rupture ([place]) 05/02/2013  . Obesity (BMI 30-39.9) 03/21/2013  . Family history of malignant neoplasm of gastrointestinal tract 08/25/2011  . 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 Fatigue 03/24/2011  . DOE (dyspnea on exertion) 03/12/2011  . Warfarin anticoagulation 03/11/2011  . INSOMNIA-SLEEP DISORDER-UNSPEC 09/12/2009  . CERVICAL RADICULOPATHY, RIGHT 07/24/2009  . AVASCULAR NECROSIS 05/21/2009  . ALLERGIC RHINITIS CAUSE UNSPECIFIED 11/01/2008  . PULMONARY NODULE, RIGHT LOWER LOBE 10/25/2008  . DIVERTICULOSIS, COLON 10/07/2008  . 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 PULMONARY EMBOLISM, HX OF 10/05/2008  . ASTHMA 02/20/2008  . HYPERPLASIA PROSTATE UNS W/UR OBST & OTH LUTS 06/28/2007  . PAF (paroxysmal atrial fibrillation) ([place]) 06/25/2007  . COLONIC POLYPS 04/01/2006  .  GOUT 04/01/2006  . Essential hypertension 04/01/2006  . ACID REFLUX DISEASE 04/01/2006    Current Outpatient Medications on File Prior to Visit  Medication Sig Dispense Refill  . albuterol (VENTOLIN HFA) 108 (90 Base) MCG/ACT inhaler Inhale 2 puffs into the lungs every 6 (six) hours as needed for wheezing or shortness of breath. 6.7 g 1  . aspirin 81 MG chewable tablet Chew 81 mg by mouth daily.     [name] atorvastatin (LIPITOR) 10 MG tablet TAKE 1 TABLET EVERY EVENING 90 tablet 3  . Cholecalciferol (VITAMIN D3 PO) Take 1 capsule by mouth daily.    . colchicine 0.6 MG tablet Take 2 tabs po x 1 then one hour later take 1 tab po x 1 9 tablet 3  . enoxaparin (LOVENOX) 120 MG/0.8ML injection Inject 0.8 mLs (120 mg total) into the skin every 12 (twelve) hours. 4 mL 0  . eszopiclone (LUNESTA) 2 MG TABS tablet TAKE 1 TABLET BY MOUTH EACH NIGHT AT BEDTIME AS NEEDED FOR SLEEP. TAKE IMMEDIATELY BEFORE BEDTIME 30 tablet 2  . Eszopiclone 3 MG TABS Take 3 mg by mouth at bedtime. Take immediately before bedtime    . Fluticasone-Umeclidin-Vilant (TRELEGY ELLIPTA) 100-62.5-25 MCG/INH AEPB Inhale 1 puff into the lungs daily.    . metFORMIN (GLUCOPHAGE) 500 MG tablet Take 1 tablet (500 mg total) by mouth 2 (two) times daily with a meal. 180 tablet 1  . metoprolol tartrate (LOPRESSOR) 25 MG tablet Take 1 tablet (25 mg total) by mouth 2 (two) times daily. 180 tablet 3  . nitroGLYCERIN (NITROSTAT) 0.4 MG SL tablet Place 1 tablet (0.4 mg total) under the tongue every 5 (five) minutes as needed  for chest pain (up to 3 doses). 25 tablet 4  . pantoprazole (PROTONIX) 40 MG tablet TAKE 1 TABLET DAILY (Patient taking differently: Take 40 mg by mouth daily. ) 90 tablet 3  . traZODone (DESYREL) 50 MG tablet Take 50 mg by mouth at bedtime.    [name] venlafaxine XR (EFFEXOR-XR) 75 MG 24 hr capsule TAKE 1 CAPSULE DAILY WITH BREAKFAST 90 capsule 3  . verapamil (CALAN-SR) 120 MG CR tablet Take 1 tablet (120 mg total) by mouth at bedtime.  90 tablet 3  . vitamin B-12 (CYANOCOBALAMIN) 1000 MCG tablet Take 1,000 mcg by mouth daily.    [name] warfarin (COUMADIN) 3 MG tablet Take 1-2 tablets (3-6 mg total) by mouth See admin instructions. 6mg  everyday except on Friday take 3mg      No current facility-administered medications on file prior to visit.    Past Medical History:  Diagnosis Date  . Arthritis   . Asthma   . BPH (benign prostatic hypertrophy)   . CAD (coronary artery disease)    a. 07/2013: s/p DES to LAD, normal LVF.  [name] Complication of anesthesia    "I got all kinds of hallucinations"  . COPD (chronic obstructive pulmonary disease) ([place])    a. 07/2013 PFT's mild airflow obstruction, no restriction, sev decrease in DLCO.  . Diabetes mellitus without complication ([place])   . Diverticulosis   . DVT (deep venous thrombosis) ([place])    a. 2010 Lower ext s/p back surgery.  [name] Dyspnea on exertion    a. 07/2013 PFT's mild airflow obstr   . GERD (gastroesophageal reflux disease)   . History of gout   . History of hiatal hernia   . Hx of echocardiogram 2015   Echo (06/2013): EF 60-65% normal wall motion, normal diastolic function, aortic sclerosis without stenosis, Trivial MR, mild SAM due to long, redundant mitral leaflets, mild RAE, normal RVSF  . Hypertension   . Macrocytic anemia    a. 07/2013: documented on prior labs.  . Obesity   . OSA (obstructive sleep apnea) 11/14/2015  . PAF (paroxysmal atrial fibrillation) ([place])    a. Flecainide discontinued 07/2013 in setting of CAD.; b. s/p PVI Ablation at [facility] (Dr Joseph Berkshire) 11/2014  . Pneumonia ~ 2010 X 1  . Pulmonary embolism ([place])    a. 2010 in setting of DVT post-op back surgery. b. Low prob VQ 07/2013.  [name] Sleep apnea    on cpap  . Spinal stenosis    Congential  . Transaminitis    a. 07/2013: mild.  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 ANTERIOR LUMBAR [place] ARTHROPLASTY  03/2008   "spacer poped out; had to  repair"  . CARDIOVERSION N/A 04/02/2019   Procedure: CARDIOVERSION;  Surgeon: Deboraha Sprang, MD;  Location: [facility] OR;  Service: Cardiovascular;  Laterality: N/A;  . CATARACT EXTRACTION W/ INTRAOCULAR LENS  IMPLANT, BILATERAL Bilateral   . COLONOSCOPY    . COLONOSCOPY W/ POLYPECTOMY  2004  . Colonoscopy with polypectomy  09/2011   2 tubular adenomas  . CORONARY ANGIOPLASTY WITH STENT PLACEMENT  08/08/2013   "1"  . JOINT REPLACEMENT    . LEFT AND RIGHT HEART CATHETERIZATION WITH CORONARY ANGIOGRAM N/A 08/07/2013   Procedure: LEFT AND RIGHT HEART CATHETERIZATION WITH CORONARY ANGIOGRAM;  Surgeon: Peter M Martinique, MD;  Location: [facility] CATH LAB;  Service: Cardiovascular;  Laterality: N/A;  . LUMBAR FUSION  02/2008  . PERCUTANEOUS CORONARY STENT INTERVENTION (PCI-S)  08/07/2013   Procedure: PERCUTANEOUS CORONARY STENT INTERVENTION (PCI-S);  Surgeon: Peter M Martinique, MD;  Location: [facility] CATH LAB;  Service: Cardiovascular;;  . PVI ablation  11/2014   Dr. Venita Sheffield [facility]  . RIGHT/LEFT HEART CATH AND CORONARY ANGIOGRAPHY N/A 08/15/2018   Procedure: RIGHT/LEFT HEART CATH AND CORONARY ANGIOGRAPHY;  Surgeon: Nelva Bush, MD;  Location: [place] CV LAB;  Service: Cardiovascular;  Laterality: N/A;  . TOTAL HIP ARTHROPLASTY Bilateral   . VASECTOMY      Social History   Socioeconomic History  . Marital status: Married    Spouse name: Not on file  . Number of children: 4  . Years of education: Not on file  . Highest education level: Not on file  Occupational History  . Occupation: Retired from [employer]    Employer: RETIRED  Tobacco Use  . Smoking status: Former Smoker    Packs/day: 2.00    Years: 40.00    Pack years: 80.00    Types: Cigarettes    Quit date: 03/09/2000    Years since quitting: 19.2  . Smokeless tobacco: Never Used  . Tobacco comment: smoked 1964-2002, up to 3 ppd  Substance and Sexual Activity  . Alcohol use: Yes    Alcohol/week: 2.0 - 3.0 standard drinks    Types: 2 - 3  Cans of beer per week    Comment: drinks beer several times weekly  . Drug use: No  . Sexual activity: Never    Comment: "sexually hx is none of your business" (02/08/2014)  Other Topics Concern  . Not on file  Social History Narrative  . Not on file   Social Determinants of Health   Financial Resource Strain:   . Difficulty of Paying Living Expenses:   Food Insecurity:   . Worried About Charity fundraiser in the Last Year:   . Arboriculturist in the Last Year:   Transportation Needs:   . Film/video editor (Medical):   [name] Lack of Transportation (Non-Medical):   Physical Activity:   . Days of Exercise per Week:   . Minutes of Exercise per Session:   Stress:   . Feeling of Stress :   Social Connections:   . Frequency of Communication with Friends and Family:   . Frequency of Social Gatherings with Friends and Family:   . Attends Religious Services:   . Active Member of Clubs or Organizations:   . Attends Archivist Meetings:   [name] Marital Status:     Family History  Problem Relation Age of Onset  . Heart attack Paternal Grandfather        Over 48  . Atrial fibrillation Mother   . COPD Mother   . Heart disease Father   . Colon cancer Father   . Heart attack Brother 51  . Heart disease Brother   . Alcohol abuse Brother 25       Died post liver transplant  . Heart attack Paternal Uncle        Less than 26  . Stroke Neg Hx   . Rectal cancer Neg Hx   . Stomach cancer Neg Hx   . Esophageal cancer Neg Hx     Review of Systems     Objective:  There were no vitals filed for this visit. BP Readings from Last 3 Encounters:  04/14/19 130/72  04/10/19 (!) 152/74  04/03/19 133/67   Wt Readings from Last 3 Encounters:  04/14/19 267 lb  9.6 oz (121.4 kg)  04/10/19 268 lb 12.8 oz (121.9 kg)  04/03/19 260 lb 1.6 oz (118 kg)   There is no height or weight on file to calculate BMI.   Physical Exam         Assessment & Plan:    See Problem List for  Assessment and Plan of chronic medical problems.     This visit occurred during the SARS-CoV-2 public health emergency.  Safety protocols were in place, including screening questions prior to the visit, additional usage of staff PPE, and extensive cleaning of exam room while observing appropriate contact time as indicated for disinfecting solutions.

## 2019-06-01 ENCOUNTER — Ambulatory Visit: Payer: Medicare Other | Admitting: Internal Medicine

## 2019-06-01 NOTE — Progress Notes (Deleted)
Subjective:    Patient ID: [name] [name], male    DOB: 12-06-1945, 74 y.o.   MRN: [id number]  HPI The patient is here for an acute visit.   Dizziness:    Medications and allergies reviewed with patient and updated if appropriate.  Patient Active Problem List   Diagnosis Date Noted  . Acute diastolic congestive heart failure; exacerbated by recurrent atrial fibrillation  04/02/2019  . Atrial fibrillation ([place]) 03/31/2019  . Chronic diastolic HF (heart failure) ([place]) 12/05/2018  . Accelerating angina ([place]) 08/15/2018  . Olecranon bursitis of right elbow 07/25/2018  . Non-rheumatic mitral regurgitation 04/16/2017  . Bilateral leg edema 12/26/2016  . Anxiety 12/26/2016  . Scar tissue 11/02/2016  . Plantar fasciitis 10/06/2016  . Vitamin D deficiency 07/21/2016  . OSA (obstructive sleep apnea) 11/14/2015  . Prediabetes 04/17/2015  . COPD GOLD II 08/21/2014  . Hyperlipidemia 08/20/2014  . Spinal stenosis of lumbar region 05/30/2014  . Abnormal CT of the abdomen 05/30/2014  . Dizziness 02/08/2014  . CAD (coronary artery disease)   . AAA (abdominal aortic aneurysm) without rupture ([place]) 05/02/2013  . Obesity (BMI 30-39.9) 03/21/2013  . Family history of malignant neoplasm of gastrointestinal tract 08/25/2011  . 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 Fatigue 03/24/2011  . DOE (dyspnea on exertion) 03/12/2011  . Warfarin anticoagulation 03/11/2011  . INSOMNIA-SLEEP DISORDER-UNSPEC 09/12/2009  . CERVICAL RADICULOPATHY, RIGHT 07/24/2009  . AVASCULAR NECROSIS 05/21/2009  . ALLERGIC RHINITIS CAUSE UNSPECIFIED 11/01/2008  . PULMONARY NODULE, RIGHT LOWER LOBE 10/25/2008  . DIVERTICULOSIS, COLON 10/07/2008  . Hypertrophic obstructive cardiomyopathy ([place]) 10/05/2008  . PULMONARY EMBOLISM, HX OF 10/05/2008  . ASTHMA 02/20/2008  . HYPERPLASIA PROSTATE UNS W/UR OBST & OTH LUTS 06/28/2007  . PAF (paroxysmal atrial fibrillation) ([place]) 06/25/2007  . COLONIC POLYPS 04/01/2006  .  GOUT 04/01/2006  . Essential hypertension 04/01/2006  . ACID REFLUX DISEASE 04/01/2006    Current Outpatient Medications on File Prior to Visit  Medication Sig Dispense Refill  . albuterol (VENTOLIN HFA) 108 (90 Base) MCG/ACT inhaler Inhale 2 puffs into the lungs every 6 (six) hours as needed for wheezing or shortness of breath. 6.7 g 1  . aspirin 81 MG chewable tablet Chew 81 mg by mouth daily.     [name] atorvastatin (LIPITOR) 10 MG tablet TAKE 1 TABLET EVERY EVENING 90 tablet 3  . Cholecalciferol (VITAMIN D3 PO) Take 1 capsule by mouth daily.    . colchicine 0.6 MG tablet Take 2 tabs po x 1 then one hour later take 1 tab po x 1 9 tablet 3  . enoxaparin (LOVENOX) 120 MG/0.8ML injection Inject 0.8 mLs (120 mg total) into the skin every 12 (twelve) hours. 4 mL 0  . eszopiclone (LUNESTA) 2 MG TABS tablet TAKE 1 TABLET BY MOUTH EACH NIGHT AT BEDTIME AS NEEDED FOR SLEEP. TAKE IMMEDIATELY BEFORE BEDTIME 30 tablet 2  . Eszopiclone 3 MG TABS Take 3 mg by mouth at bedtime. Take immediately before bedtime    . Fluticasone-Umeclidin-Vilant (TRELEGY ELLIPTA) 100-62.5-25 MCG/INH AEPB Inhale 1 puff into the lungs daily.    . metFORMIN (GLUCOPHAGE) 500 MG tablet Take 1 tablet (500 mg total) by mouth 2 (two) times daily with a meal. 180 tablet 1  . metoprolol tartrate (LOPRESSOR) 25 MG tablet Take 1 tablet (25 mg total) by mouth 2 (two) times daily. 180 tablet 3  . nitroGLYCERIN (NITROSTAT) 0.4 MG SL tablet Place 1 tablet (0.4 mg total) under the tongue every 5 (five) minutes as needed  for chest pain (up to 3 doses). 25 tablet 4  . pantoprazole (PROTONIX) 40 MG tablet TAKE 1 TABLET DAILY (Patient taking differently: Take 40 mg by mouth daily. ) 90 tablet 3  . traZODone (DESYREL) 50 MG tablet Take 50 mg by mouth at bedtime.    [name] venlafaxine XR (EFFEXOR-XR) 75 MG 24 hr capsule TAKE 1 CAPSULE DAILY WITH BREAKFAST 90 capsule 3  . verapamil (CALAN-SR) 120 MG CR tablet Take 1 tablet (120 mg total) by mouth at bedtime.  90 tablet 3  . vitamin B-12 (CYANOCOBALAMIN) 1000 MCG tablet Take 1,000 mcg by mouth daily.    [name] warfarin (COUMADIN) 3 MG tablet Take 1-2 tablets (3-6 mg total) by mouth See admin instructions. 6mg  everyday except on Friday take 3mg      No current facility-administered medications on file prior to visit.    Past Medical History:  Diagnosis Date  . Arthritis   . Asthma   . BPH (benign prostatic hypertrophy)   . CAD (coronary artery disease)    a. 07/2013: s/p DES to LAD, normal LVF.  [name] Complication of anesthesia    "I got all kinds of hallucinations"  . COPD (chronic obstructive pulmonary disease) ([place])    a. 07/2013 PFT's mild airflow obstruction, no restriction, sev decrease in DLCO.  . Diabetes mellitus without complication ([place])   . Diverticulosis   . DVT (deep venous thrombosis) ([place])    a. 2010 Lower ext s/p back surgery.  [name] Dyspnea on exertion    a. 07/2013 PFT's mild airflow obstr   . GERD (gastroesophageal reflux disease)   . History of gout   . History of hiatal hernia   . Hx of echocardiogram 2015   Echo (06/2013): EF 60-65% normal wall motion, normal diastolic function, aortic sclerosis without stenosis, Trivial MR, mild SAM due to long, redundant mitral leaflets, mild RAE, normal RVSF  . Hypertension   . Macrocytic anemia    a. 07/2013: documented on prior labs.  . Obesity   . OSA (obstructive sleep apnea) 11/14/2015  . PAF (paroxysmal atrial fibrillation) ([place])    a. Flecainide discontinued 07/2013 in setting of CAD.; b. s/p PVI Ablation at [facility] (Dr Joseph Berkshire) 11/2014  . Pneumonia ~ 2010 X 1  . Pulmonary embolism ([place])    a. 2010 in setting of DVT post-op back surgery. b. Low prob VQ 07/2013.  [name] Sleep apnea    on cpap  . Spinal stenosis    Congential  . Transaminitis    a. 07/2013: mild.  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 ANTERIOR LUMBAR [place] ARTHROPLASTY  03/2008   "spacer poped out; had to  repair"  . CARDIOVERSION N/A 04/02/2019   Procedure: CARDIOVERSION;  Surgeon: Deboraha Sprang, MD;  Location: [facility] OR;  Service: Cardiovascular;  Laterality: N/A;  . CATARACT EXTRACTION W/ INTRAOCULAR LENS  IMPLANT, BILATERAL Bilateral   . COLONOSCOPY    . COLONOSCOPY W/ POLYPECTOMY  2004  . Colonoscopy with polypectomy  09/2011   2 tubular adenomas  . CORONARY ANGIOPLASTY WITH STENT PLACEMENT  08/08/2013   "1"  . JOINT REPLACEMENT    . LEFT AND RIGHT HEART CATHETERIZATION WITH CORONARY ANGIOGRAM N/A 08/07/2013   Procedure: LEFT AND RIGHT HEART CATHETERIZATION WITH CORONARY ANGIOGRAM;  Surgeon: Peter M Martinique, MD;  Location: [facility] CATH LAB;  Service: Cardiovascular;  Laterality: N/A;  . LUMBAR FUSION  02/2008  . PERCUTANEOUS CORONARY STENT INTERVENTION (PCI-S)  08/07/2013   Procedure: PERCUTANEOUS CORONARY STENT INTERVENTION (PCI-S);  Surgeon: Peter M Martinique, MD;  Location: [facility] CATH LAB;  Service: Cardiovascular;;  . PVI ablation  11/2014   Dr. Venita Sheffield [facility]  . RIGHT/LEFT HEART CATH AND CORONARY ANGIOGRAPHY N/A 08/15/2018   Procedure: RIGHT/LEFT HEART CATH AND CORONARY ANGIOGRAPHY;  Surgeon: Nelva Bush, MD;  Location: [place] CV LAB;  Service: Cardiovascular;  Laterality: N/A;  . TOTAL HIP ARTHROPLASTY Bilateral   . VASECTOMY      Social History   Socioeconomic History  . Marital status: Married    Spouse name: Not on file  . Number of children: 4  . Years of education: Not on file  . Highest education level: Not on file  Occupational History  . Occupation: Retired from [employer]    Employer: RETIRED  Tobacco Use  . Smoking status: Former Smoker    Packs/day: 2.00    Years: 40.00    Pack years: 80.00    Types: Cigarettes    Quit date: 03/09/2000    Years since quitting: 19.2  . Smokeless tobacco: Never Used  . Tobacco comment: smoked 1964-2002, up to 3 ppd  Substance and Sexual Activity  . Alcohol use: Yes    Alcohol/week: 2.0 - 3.0 standard drinks    Types: 2 - 3  Cans of beer per week    Comment: drinks beer several times weekly  . Drug use: No  . Sexual activity: Never    Comment: "sexually hx is none of your business" (02/08/2014)  Other Topics Concern  . Not on file  Social History Narrative  . Not on file   Social Determinants of Health   Financial Resource Strain:   . Difficulty of Paying Living Expenses:   Food Insecurity:   . Worried About Charity fundraiser in the Last Year:   . Arboriculturist in the Last Year:   Transportation Needs:   . Film/video editor (Medical):   [name] Lack of Transportation (Non-Medical):   Physical Activity:   . Days of Exercise per Week:   . Minutes of Exercise per Session:   Stress:   . Feeling of Stress :   Social Connections:   . Frequency of Communication with Friends and Family:   . Frequency of Social Gatherings with Friends and Family:   . Attends Religious Services:   . Active Member of Clubs or Organizations:   . Attends Archivist Meetings:   [name] Marital Status:     Family History  Problem Relation Age of Onset  . Heart attack Paternal Grandfather        Over 83  . Atrial fibrillation Mother   . COPD Mother   . Heart disease Father   . Colon cancer Father   . Heart attack Brother 4  . Heart disease Brother   . Alcohol abuse Brother 75       Died post liver transplant  . Heart attack Paternal Uncle        Less than 4  . Stroke Neg Hx   . Rectal cancer Neg Hx   . Stomach cancer Neg Hx   . Esophageal cancer Neg Hx     Review of Systems     Objective:  There were no vitals filed for this visit. BP Readings from Last 3 Encounters:  04/14/19 130/72  04/10/19 (!) 152/74  04/03/19 133/67   Wt Readings from Last 3 Encounters:  04/14/19 267 lb  9.6 oz (121.4 kg)  04/10/19 268 lb 12.8 oz (121.9 kg)  04/03/19 260 lb 1.6 oz (118 kg)   There is no height or weight on file to calculate BMI.   Physical Exam         Assessment & Plan:    See Problem List for  Assessment and Plan of chronic medical problems.     This visit occurred during the SARS-CoV-2 public health emergency.  Safety protocols were in place, including screening questions prior to the visit, additional usage of staff PPE, and extensive cleaning of exam room while observing appropriate contact time as indicated for disinfecting solutions.

## 2019-06-02 ENCOUNTER — Ambulatory Visit: Payer: Medicare Other | Admitting: Internal Medicine

## 2019-06-13 ENCOUNTER — Encounter: Payer: Self-pay | Admitting: Internal Medicine

## 2019-06-14 MED ORDER — PREDNISONE 10 MG PO TABS: ORAL_TABLET | ORAL | 0 refills | Status: DC

## 2019-06-14 NOTE — Addendum Note (Signed)
Addended by: Binnie Rail on: 06/14/2019 07:36 AM   Modules accepted: Orders

## 2019-06-15 ENCOUNTER — Telehealth: Payer: Self-pay | Admitting: Internal Medicine

## 2019-06-15 NOTE — Telephone Encounter (Signed)
   Patient scheduled for 4/9 Spouse calling with concerns about feet swelling, she has questions about him taking Prednisone Please call

## 2019-06-15 NOTE — Progress Notes (Signed)
Subjective:    Patient ID: [name] [name], male    DOB: 1945/04/13, 74 y.o.   MRN: [id number]  HPI The patient is here for an acute visit.  The gout hit him Tuesday.  It was in his ankle and entire top of his foot.  It was red, very painful and swollen.  He was not able to work.  He had a to a lesser degree in the left ankle and foot as well.  Tomorrow he starts 20 mg in the taper.  He is 70-75% better in the right foot and 95% better in the left foot.   He is able to walk.  The top of his right foot is slightly numb, which started with the gout.  This has improved.  He was concerned that the entire top of the foot was swollen-he has not seen it before and did not know if something else was going on.  He also wondered about preventing the gout.  This is the first time that has had more frequent episodes of gout-in the past it was 3-4-year  He did eat seafood the night before his symptoms started  Medications and allergies reviewed with patient and updated if appropriate.  Patient Active Problem List   Diagnosis Date Noted  . Acute diastolic congestive heart failure; exacerbated by recurrent atrial fibrillation  04/02/2019  . Atrial fibrillation ([place]) 03/31/2019  . Chronic diastolic HF (heart failure) ([place]) 12/05/2018  . Accelerating angina ([place]) 08/15/2018  . Olecranon bursitis of right elbow 07/25/2018  . Non-rheumatic mitral regurgitation 04/16/2017  . Bilateral leg edema 12/26/2016  . Anxiety 12/26/2016  . Scar tissue 11/02/2016  . Plantar fasciitis 10/06/2016  . Vitamin D deficiency 07/21/2016  . OSA (obstructive sleep apnea) 11/14/2015  . Prediabetes 04/17/2015  . COPD GOLD II 08/21/2014  . Hyperlipidemia 08/20/2014  . Spinal stenosis of lumbar region 05/30/2014  . Abnormal CT of the abdomen 05/30/2014  . Dizziness 02/08/2014  . CAD (coronary artery disease)   . AAA (abdominal aortic aneurysm) without rupture ([place]) 05/02/2013  . Obesity (BMI 30-39.9) 03/21/2013  .  Family history of malignant neoplasm of gastrointestinal tract 08/25/2011  . 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 Fatigue 03/24/2011  . DOE (dyspnea on exertion) 03/12/2011  . Warfarin anticoagulation 03/11/2011  . INSOMNIA-SLEEP DISORDER-UNSPEC 09/12/2009  . CERVICAL RADICULOPATHY, RIGHT 07/24/2009  . AVASCULAR NECROSIS 05/21/2009  . ALLERGIC RHINITIS CAUSE UNSPECIFIED 11/01/2008  . PULMONARY NODULE, RIGHT LOWER LOBE 10/25/2008  . DIVERTICULOSIS, COLON 10/07/2008  . Hypertrophic obstructive cardiomyopathy ([place]) 10/05/2008  . PULMONARY EMBOLISM, HX OF 10/05/2008  . ASTHMA 02/20/2008  . HYPERPLASIA PROSTATE UNS W/UR OBST & OTH LUTS 06/28/2007  . PAF (paroxysmal atrial fibrillation) ([place]) 06/25/2007  . COLONIC POLYPS 04/01/2006  . GOUT 04/01/2006  . Essential hypertension 04/01/2006  . ACID REFLUX DISEASE 04/01/2006    Current Outpatient Medications on File Prior to Visit  Medication Sig Dispense Refill  . albuterol (VENTOLIN HFA) 108 (90 Base) MCG/ACT inhaler Inhale 2 puffs into the lungs every 6 (six) hours as needed for wheezing or shortness of breath. 6.7 g 1  . aspirin 81 MG chewable tablet Chew 81 mg by mouth daily.     [name] atorvastatin (LIPITOR) 10 MG tablet TAKE 1 TABLET EVERY EVENING 90 tablet 3  . Cholecalciferol (VITAMIN D3 PO) Take 1 capsule by mouth daily.    . Eszopiclone 3 MG TABS Take 3 mg by mouth at bedtime. Take immediately before bedtime    . Fluticasone-Umeclidin-Vilant (  TRELEGY ELLIPTA) 100-62.5-25 MCG/INH AEPB Inhale 1 puff into the lungs daily.    [name] HYDROcodone-acetaminophen (NORCO/VICODIN) 5-325 MG tablet     . metFORMIN (GLUCOPHAGE) 500 MG tablet Take 1 tablet (500 mg total) by mouth 2 (two) times daily with a meal. 180 tablet 1  . methocarbamol (ROBAXIN) 750 MG tablet methocarbamol 750 mg tablet  TAKE 1 TABLET BY MOUTH THREE TIMES A DAY AS NEEDED FOR MUSCLE SPASMS    . metoprolol tartrate (LOPRESSOR) 25 MG tablet Take 1 tablet (25 mg total) by mouth 2  (two) times daily. 180 tablet 3  . pantoprazole (PROTONIX) 40 MG tablet TAKE 1 TABLET DAILY (Patient taking differently: Take 40 mg by mouth daily. ) 90 tablet 3  . predniSONE (DELTASONE) 10 MG tablet 3 tabs po qd x 3 days, then 2 tabs po qd x 3 days, then 1 tab po qd x 3 days 18 tablet 0  . traZODone (DESYREL) 50 MG tablet Take 50 mg by mouth at bedtime.    [name] venlafaxine XR (EFFEXOR-XR) 75 MG 24 hr capsule TAKE 1 CAPSULE DAILY WITH BREAKFAST 90 capsule 3  . verapamil (CALAN-SR) 120 MG CR tablet Take 1 tablet (120 mg total) by mouth at bedtime. 90 tablet 3  . vitamin B-12 (CYANOCOBALAMIN) 1000 MCG tablet Take 1,000 mcg by mouth daily.    [name] warfarin (COUMADIN) 3 MG tablet Take 1-2 tablets (3-6 mg total) by mouth See admin instructions. 6mg  everyday except on Friday take 3mg     . nitroGLYCERIN (NITROSTAT) 0.4 MG SL tablet Place 1 tablet (0.4 mg total) under the tongue every 5 (five) minutes as needed for chest pain (up to 3 doses). (Patient not taking: Reported on 06/16/2019) 25 tablet 4   No current facility-administered medications on file prior to visit.    Past Medical History:  Diagnosis Date  . Arthritis   . Asthma   . BPH (benign prostatic hypertrophy)   . CAD (coronary artery disease)    a. 07/2013: s/p DES to LAD, normal LVF.  [name] Complication of anesthesia    "I got all kinds of hallucinations"  . COPD (chronic obstructive pulmonary disease) ([place])    a. 07/2013 PFT's mild airflow obstruction, no restriction, sev decrease in DLCO.  . Diabetes mellitus without complication ([place])   . Diverticulosis   . DVT (deep venous thrombosis) ([place])    a. 2010 Lower ext s/p back surgery.  [name] Dyspnea on exertion    a. 07/2013 PFT's mild airflow obstr   . GERD (gastroesophageal reflux disease)   . History of gout   . History of hiatal hernia   . Hx of echocardiogram 2015   Echo (06/2013): EF 60-65% normal wall motion, normal diastolic function, aortic sclerosis without stenosis, Trivial MR, mild SAM  due to long, redundant mitral leaflets, mild RAE, normal RVSF  . Hypertension   . Macrocytic anemia    a. 07/2013: documented on prior labs.  . Obesity   . OSA (obstructive sleep apnea) 11/14/2015  . PAF (paroxysmal atrial fibrillation) ([place])    a. Flecainide discontinued 07/2013 in setting of CAD.; b. s/p PVI Ablation at [facility] (Dr Joseph Berkshire) 11/2014  . Pneumonia ~ 2010 X 1  . Pulmonary embolism ([place])    a. 2010 in setting of DVT post-op back surgery. b. Low prob VQ 07/2013.  [name] Sleep apnea    on cpap  . Spinal stenosis    Congential  . Transaminitis    a. 07/2013: mild.  [name] Ulcerative  proctitis ([place]) 08/25/2011    Past Surgical History:  Procedure Laterality Date  . ANTERIOR LUMBAR [place] ARTHROPLASTY  03/2008   "spacer poped out; had to repair"  . CARDIOVERSION N/A 04/02/2019   Procedure: CARDIOVERSION;  Surgeon: Deboraha Sprang, MD;  Location: [facility] OR;  Service: Cardiovascular;  Laterality: N/A;  . CATARACT EXTRACTION W/ INTRAOCULAR LENS  IMPLANT, BILATERAL Bilateral   . COLONOSCOPY    . COLONOSCOPY W/ POLYPECTOMY  2004  . Colonoscopy with polypectomy  09/2011   2 tubular adenomas  . CORONARY ANGIOPLASTY WITH STENT PLACEMENT  08/08/2013   "1"  . JOINT REPLACEMENT    . LEFT AND RIGHT HEART CATHETERIZATION WITH CORONARY ANGIOGRAM N/A 08/07/2013   Procedure: LEFT AND RIGHT HEART CATHETERIZATION WITH CORONARY ANGIOGRAM;  Surgeon: Peter M Martinique, MD;  Location: [facility] CATH LAB;  Service: Cardiovascular;  Laterality: N/A;  . LUMBAR FUSION  02/2008  . PERCUTANEOUS CORONARY STENT INTERVENTION (PCI-S)  08/07/2013   Procedure: PERCUTANEOUS CORONARY STENT INTERVENTION (PCI-S);  Surgeon: Peter M Martinique, MD;  Location: [facility] CATH LAB;  Service: Cardiovascular;;  . PVI ablation  11/2014   Dr. Venita Sheffield [facility]  . RIGHT/LEFT HEART CATH AND CORONARY ANGIOGRAPHY N/A 08/15/2018   Procedure: RIGHT/LEFT HEART CATH AND CORONARY ANGIOGRAPHY;  Surgeon: Nelva Bush, MD;  Location: [place] CV LAB;   Service: Cardiovascular;  Laterality: N/A;  . TOTAL HIP ARTHROPLASTY Bilateral   . VASECTOMY      Social History   Socioeconomic History  . Marital status: Married    Spouse name: Not on file  . Number of children: 4  . Years of education: Not on file  . Highest education level: Not on file  Occupational History  . Occupation: Retired from [employer]    Employer: RETIRED  Tobacco Use  . Smoking status: Former Smoker    Packs/day: 2.00    Years: 40.00    Pack years: 80.00    Types: Cigarettes    Quit date: 03/09/2000    Years since quitting: 19.2  . Smokeless tobacco: Never Used  . Tobacco comment: smoked 1964-2002, up to 3 ppd  Substance and Sexual Activity  . Alcohol use: Yes    Alcohol/week: 2.0 - 3.0 standard drinks    Types: 2 - 3 Cans of beer per week    Comment: drinks beer several times weekly  . Drug use: No  . Sexual activity: Never    Comment: "sexually hx is none of your business" (02/08/2014)  Other Topics Concern  . Not on file  Social History Narrative  . Not on file   Social Determinants of Health   Financial Resource Strain:   . Difficulty of Paying Living Expenses:   Food Insecurity:   . Worried About Charity fundraiser in the Last Year:   . Arboriculturist in the Last Year:   Transportation Needs:   . Film/video editor (Medical):   [name] Lack of Transportation (Non-Medical):   Physical Activity:   . Days of Exercise per Week:   . Minutes of Exercise per Session:   Stress:   . Feeling of Stress :   Social Connections:   . Frequency of Communication with Friends and Family:   . Frequency of Social Gatherings with Friends and Family:   . Attends Religious Services:   . Active Member of Clubs or Organizations:   . Attends Archivist Meetings:   [name] Marital Status:     Family History  Problem Relation Age of Onset  . Heart attack Paternal Grandfather        Over 43  . Atrial fibrillation Mother   . COPD Mother   . Heart disease Father    . Colon cancer Father   . Heart attack Brother 47  . Heart disease Brother   . Alcohol abuse Brother 72       Died post liver transplant  . Heart attack Paternal Uncle        Less than 46  . Stroke Neg Hx   . Rectal cancer Neg Hx   . Stomach cancer Neg Hx   . Esophageal cancer Neg Hx     Review of Systems     Objective:   Vitals:   06/16/19 1426  BP: (!) 144/80  Pulse: 66  Temp: 99.3 F (37.4 C)  SpO2: 97%   BP Readings from Last 3 Encounters:  06/16/19 (!) 144/80  04/14/19 130/72  04/10/19 (!) 152/74   Wt Readings from Last 3 Encounters:  06/16/19 261 lb (118.4 kg)  04/14/19 267 lb 9.6 oz (121.4 kg)  04/10/19 268 lb 12.8 oz (121.9 kg)   Body mass index is 33.51 kg/m.   Physical Exam    Constitutional: Appears well-developed and well-nourished. No distress.  Bilateral feet: Right foot with mild erythema on the dorsal aspect, minimal swelling on dorsal foot and ankle.  Increased pain with active and passive movement of ankle, slight decrease sensation dorsal aspect on lateral foot.  Skin intact.  Left foot without erythema or swelling.  Passive and active movement of ankle without pain.  Skin intact.      Assessment & Plan:    See Problem List for Assessment and Plan of chronic medical problems.     This visit occurred during the SARS-CoV-2 public health emergency.  Safety protocols were in place, including screening questions prior to the visit, additional usage of staff PPE, and extensive cleaning of exam room while observing appropriate contact time as indicated for disinfecting solutions.

## 2019-06-16 ENCOUNTER — Ambulatory Visit (INDEPENDENT_AMBULATORY_CARE_PROVIDER_SITE_OTHER): Payer: Medicare Other | Admitting: Internal Medicine

## 2019-06-16 ENCOUNTER — Encounter: Payer: Self-pay | Admitting: Internal Medicine

## 2019-06-16 ENCOUNTER — Other Ambulatory Visit: Payer: Self-pay

## 2019-06-16 DIAGNOSIS — M109 Gout, unspecified: Secondary | ICD-10-CM

## 2019-06-16 DIAGNOSIS — I2510 Atherosclerotic heart disease of native coronary artery without angina pectoris: Secondary | ICD-10-CM

## 2019-06-16 MED ORDER — ALLOPURINOL 100 MG PO TABS: 100.0000 mg | ORAL_TABLET | Freq: Every day | ORAL | 1 refills | Status: AC

## 2019-06-16 NOTE — Patient Instructions (Addendum)
  Medications reviewed and updated.  Changes include :   Start allopurinol 100 mg for prevention of gout  Your prescription(s) have been submitted to your pharmacy. Please take as directed and contact our office if you believe you are having problem(s) with the medication(s).  Please call if there is no improvement in your symptoms.

## 2019-06-16 NOTE — Telephone Encounter (Signed)
Pt has appt today @ 2:30 for symptoms.Marland KitchenMarland Kitchenforwarding to MD for FYI.Marland KitchenStarleen Blue

## 2019-06-16 NOTE — Assessment & Plan Note (Signed)
Acute gout-bilateral feet-right worse than left Already started on prednisone taper to start 20 mg tomorrow He did eat seafood the night before this started, which could be the cause He is having more frequent episodes-discussed preventative medication-we will start allopurinol 100 mg daily Complete prednisone taper He will call if his symptoms do not completely resolve

## 2019-06-30 ENCOUNTER — Other Ambulatory Visit: Payer: Self-pay

## 2019-06-30 ENCOUNTER — Ambulatory Visit (INDEPENDENT_AMBULATORY_CARE_PROVIDER_SITE_OTHER): Payer: Medicare Other | Admitting: Pharmacist Clinician (PhC)/ Clinical Pharmacy Specialist

## 2019-06-30 DIAGNOSIS — Z86718 Personal history of other venous thrombosis and embolism: Secondary | ICD-10-CM

## 2019-06-30 DIAGNOSIS — I480 Paroxysmal atrial fibrillation: Secondary | ICD-10-CM

## 2019-06-30 DIAGNOSIS — Z7901 Long term (current) use of anticoagulants: Secondary | ICD-10-CM | POA: Diagnosis not present

## 2019-06-30 LAB — POCT INR: INR: 3.5 — AB (ref 2.0–3.0)

## 2019-07-12 DIAGNOSIS — Z7189 Other specified counseling: Secondary | ICD-10-CM | POA: Insufficient documentation

## 2019-07-12 NOTE — Progress Notes (Signed)
Cardiology Office Note   Date:  07/13/2019   ID:  [name] [name], DOB 07-06-1945, MRN [id number]  PCP:  Binnie Rail, MD  Cardiologist:   Minus Breeding, MD   No chief complaint on file.     History of Present Illness: [name] [name] is a 74 y.o. male who presents for of CAD s/p PCI/DES to LAD 07/2013, atrial fibrillations/pablation at the [facility] 2016but remainson coumadin due to ongoing paroxysmal atrial fibrillation, hypertension, AAA, DVT with PE(provoked following surgery ~17yr ago), along with OSA and COPD followed by Dr.Sood.He had a cardiac cath In June 2020.  He had a patent stent in the LAD had mild diffuse disease elsewhere.  He traveled back up to [place] where he saw his cardiologist up there.  Because he is monitor that he wore to you earlier this year demonstrated some brief runs of SVT and could not exclude atrial fibrillation he wanted to be seen.  I did not think he had any episodes of fibrillation but because of ongoing symptoms I suggested to him that he might need stress echocardiography.   I spoke with the cardiologist and followed up with a dobutamine stress echo to look for an inducible gradient across the aortic outflow tract, SAM and elevated pulmonary pressures.   There was no clear evidence of SAM.  Unfortunately the pulmonary pressures were not able to be measured.  However, there was an increased ventricular gradient with dobutamine that was 100 mm Hg although it was not entirely clear whether the gradient was subvalvular or mid cavity.   I subsequently increased his verapamil.  He did not have improvement and I sent him to Dr. Halford Chessman for a pulmonary evaluation. He had a CPX which suggested that his limitation was possibly weight and deconditioning but could also be related to diastolic dysfunction.  He had flutter.  We admitted him to the hospital and he had DCCV.   He was very orthostatic on discharge and had compressions stockings placed.   He had his beta blocker reduced.    Since I last saw him he has had a couple of episodes where he "felt funny" and his device suggested maybe atrial fibrillation but he raised those tracings and did not bring them today.  He thinks his breathing is better than it was recently and prior to cardioversion.  He has not had any severe PND or orthopnea.  He is trying to walk a little bit and has lost a few pounds.  He still thinks his breathing is not great.  However, he is not in distress.  Has had no cough fevers or chills.  He has had no presyncope or syncope.  Has had no lower extremity swelling.   Past Medical History:  Diagnosis Date  . Arthritis   . Asthma   . BPH (benign prostatic hypertrophy)   . CAD (coronary artery disease)    a. 07/2013: s/p DES to LAD, normal LVF.  [name] Complication of anesthesia    "I got all kinds of hallucinations"  . COPD (chronic obstructive pulmonary disease) ([place])    a. 07/2013 PFT's mild airflow obstruction, no restriction, sev decrease in DLCO.  . Diabetes mellitus without complication ([place])   . Diverticulosis   . DVT (deep venous thrombosis) ([place])    a. 2010 Lower ext s/p back surgery.  [name] Dyspnea on exertion    a. 07/2013 PFT's mild airflow obstr   . GERD (gastroesophageal reflux disease)   . History  of gout   . History of hiatal hernia   . Hx of echocardiogram 2015   Echo (06/2013): EF 60-65% normal wall motion, normal diastolic function, aortic sclerosis without stenosis, Trivial MR, mild SAM due to long, redundant mitral leaflets, mild RAE, normal RVSF  . Hypertension   . Macrocytic anemia    a. 07/2013: documented on prior labs.  . Obesity   . OSA (obstructive sleep apnea) 11/14/2015  . PAF (paroxysmal atrial fibrillation) ([place])    a. Flecainide discontinued 07/2013 in setting of CAD.; b. s/p PVI Ablation at [facility] (Dr Joseph Berkshire) 11/2014  . Pneumonia ~ 2010 X 1  . Pulmonary embolism ([place])    a. 2010 in setting of DVT post-op back surgery. b. Low  prob VQ 07/2013.  [name] Sleep apnea    on cpap  . Spinal stenosis    Congential  . Transaminitis    a. 07/2013: mild.  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 ANTERIOR LUMBAR [place] ARTHROPLASTY  03/2008   "spacer poped out; had to repair"  . CARDIOVERSION N/A 04/02/2019   Procedure: CARDIOVERSION;  Surgeon: Deboraha Sprang, MD;  Location: [facility] OR;  Service: Cardiovascular;  Laterality: N/A;  . CATARACT EXTRACTION W/ INTRAOCULAR LENS  IMPLANT, BILATERAL Bilateral   . COLONOSCOPY    . COLONOSCOPY W/ POLYPECTOMY  2004  . Colonoscopy with polypectomy  09/2011   2 tubular adenomas  . CORONARY ANGIOPLASTY WITH STENT PLACEMENT  08/08/2013   "1"  . JOINT REPLACEMENT    . LEFT AND RIGHT HEART CATHETERIZATION WITH CORONARY ANGIOGRAM N/A 08/07/2013   Procedure: LEFT AND RIGHT HEART CATHETERIZATION WITH CORONARY ANGIOGRAM;  Surgeon: Peter M Martinique, MD;  Location: [facility] CATH LAB;  Service: Cardiovascular;  Laterality: N/A;  . LUMBAR FUSION  02/2008  . PERCUTANEOUS CORONARY STENT INTERVENTION (PCI-S)  08/07/2013   Procedure: PERCUTANEOUS CORONARY STENT INTERVENTION (PCI-S);  Surgeon: Peter M Martinique, MD;  Location: [facility] CATH LAB;  Service: Cardiovascular;;  . PVI ablation  11/2014   Dr. Venita Sheffield [facility]  . RIGHT/LEFT HEART CATH AND CORONARY ANGIOGRAPHY N/A 08/15/2018   Procedure: RIGHT/LEFT HEART CATH AND CORONARY ANGIOGRAPHY;  Surgeon: Nelva Bush, MD;  Location: [place] [facility] CV LAB;  Service: Cardiovascular;  Laterality: N/A;  . TOTAL HIP ARTHROPLASTY Bilateral   . VASECTOMY       Current Outpatient Medications  Medication Sig Dispense Refill  . albuterol (VENTOLIN HFA) 108 (90 Base) MCG/ACT inhaler Inhale 2 puffs into the lungs every 6 (six) hours as needed for wheezing or shortness of breath. 6.7 g 1  . allopurinol (ZYLOPRIM) 100 MG tablet Take 1 tablet (100 mg total) by mouth daily. 90 tablet 1  . aspirin 81 MG chewable tablet Chew 81  mg by mouth daily.     [name] atorvastatin (LIPITOR) 10 MG tablet TAKE 1 TABLET EVERY EVENING 90 tablet 3  . Cholecalciferol (VITAMIN D3 PO) Take 1 capsule by mouth daily.    . Eszopiclone 3 MG TABS Take 3 mg by mouth at bedtime. Take immediately before bedtime    . Fluticasone-Umeclidin-Vilant (TRELEGY ELLIPTA) 100-62.5-25 MCG/INH AEPB Inhale 1 puff into the lungs daily.    [name] HYDROcodone-acetaminophen (NORCO/VICODIN) 5-325 MG tablet     . metFORMIN (GLUCOPHAGE) 500 MG tablet Take 1 tablet (500 mg total) by mouth 2 (two) times daily with a meal. 180 tablet 1  . methocarbamol (ROBAXIN) 750 MG tablet methocarbamol 750 mg tablet  TAKE  1 TABLET BY MOUTH THREE TIMES A DAY AS NEEDED FOR MUSCLE SPASMS    . metoprolol tartrate (LOPRESSOR) 25 MG tablet Take 1 tablet (25 mg total) by mouth 2 (two) times daily. 180 tablet 3  . nitroGLYCERIN (NITROSTAT) 0.4 MG SL tablet Place 1 tablet (0.4 mg total) under the tongue every 5 (five) minutes as needed for chest pain (up to 3 doses). (Patient not taking: Reported on 06/16/2019) 25 tablet 4  . pantoprazole (PROTONIX) 40 MG tablet TAKE 1 TABLET DAILY (Patient taking differently: Take 40 mg by mouth daily. ) 90 tablet 3  . predniSONE (DELTASONE) 10 MG tablet 3 tabs po qd x 3 days, then 2 tabs po qd x 3 days, then 1 tab po qd x 3 days 18 tablet 0  . traZODone (DESYREL) 50 MG tablet Take 50 mg by mouth at bedtime.    [name] venlafaxine XR (EFFEXOR-XR) 75 MG 24 hr capsule TAKE 1 CAPSULE DAILY WITH BREAKFAST 90 capsule 3  . verapamil (CALAN-SR) 120 MG CR tablet Take 1 tablet (120 mg total) by mouth at bedtime. 90 tablet 3  . vitamin B-12 (CYANOCOBALAMIN) 1000 MCG tablet Take 1,000 mcg by mouth daily.    [name] warfarin (COUMADIN) 3 MG tablet Take 1-2 tablets (3-6 mg total) by mouth See admin instructions. 6mg  everyday except on Friday take 3mg      No current facility-administered medications for this visit.    Allergies:   Penicillins     ROS:  Please see the history of present  illness.   Otherwise, review of systems are positive none   All other systems are reviewed and negative.    PHYSICAL EXAM: VS:  BP 130/83   Pulse 64   Temp (!) 97 F (36.1 C)   Ht 6\' 2"  (1.88 m)   Wt 263 lb 12.8 oz (119.7 kg)   SpO2 97%   BMI 33.87 kg/m  , BMI Body mass index is 33.87 kg/m. GENERAL:  Well appearing NECK:  No jugular venous distention, waveform within normal limits, carotid upstroke brisk and symmetric, no bruits, no thyromegaly LUNGS:  Clear to auscultation bilaterally CHEST:  Unremarkable HEART:  PMI not displaced or sustained,S1 and S2 within normal limits, no S3, no S4, no clicks, no rubs, no murmurs ABD:  Flat, positive bowel sounds normal in frequency in pitch, no bruits, no rebound, no guarding, no midline pulsatile mass, no hepatomegaly, no splenomegaly EXT:  2 plus pulses throughout, no edema, no cyanosis no clubbing   EKG:  EKG is not ordered today.   Recent Labs: 03/22/2019: ALT 28; TSH 2.01 03/31/2019: B Natriuretic Peptide 363.7 04/03/2019: BUN 22; Creatinine, Ser 1.12; Hemoglobin 9.8; Magnesium 1.7; Platelets 345; Potassium 4.4; Sodium 143    Lipid Panel    Component Value Date/Time   CHOL 96 03/22/2019 1416   TRIG 118.0 03/22/2019 1416   HDL 32.50 (L) 03/22/2019 1416   CHOLHDL 3 03/22/2019 1416   VLDL 23.6 03/22/2019 1416   LDLCALC 40 03/22/2019 1416      Wt Readings from Last 3 Encounters:  07/13/19 263 lb 12.8 oz (119.7 kg)  06/16/19 261 lb (118.4 kg)  04/14/19 267 lb 9.6 oz (121.4 kg)      Other studies Reviewed: Additional studies/ records that were reviewed today include: None Review of the above records demonstrates:  Please see elsewhere in the note.     ASSESSMENT AND PLAN:  ATRIAL FIBRILLATION:Mr.Wofford S McCarthyhas a CHA2DS2 - VASc score of 3.    He  is in sinus rhythm by exam today.  No change in therapy.  He tolerates anticoagulation.  DYSPNEA:  His breathing is at baseline.  No change in  therapy.  [id number] is 3.1 cm.   We will follow this up in a couple years.  CAD: He is having no chest discomfort.  He had previous stenting in his LAD and mild disease in his right.  He is not having any angina.  No change in therapy.   HTN:  He has been hypotensive.  However, his blood pressure currently is well controlled.  No change in therapy.  MR:He had no inducible exam reviewed an echo.  No change in therapy.  SLEEP APNEA:  He has is followed by Dr. Halford Chessman.  COVID EDUCATION:  He is vaccinated.    Current medicines are reviewed at length with the patient today.  The patient does not have concerns regarding medicines.  The following changes have been made:  None  Labs/ tests ordered today include: None  No orders of the defined types were placed in this encounter.    Disposition:   FU with me in 6 months.     Signed, Minus Breeding, MD  07/13/2019 12:15 PM    [place]

## 2019-07-13 ENCOUNTER — Other Ambulatory Visit: Payer: Self-pay

## 2019-07-13 ENCOUNTER — Ambulatory Visit (INDEPENDENT_AMBULATORY_CARE_PROVIDER_SITE_OTHER): Payer: Medicare Other | Admitting: Cardiology

## 2019-07-13 ENCOUNTER — Encounter: Payer: Self-pay | Admitting: Cardiology

## 2019-07-13 VITALS — BP 130/83 | HR 64 | Temp 97.0°F | Ht 74.0 in | Wt 263.8 lb

## 2019-07-13 DIAGNOSIS — R0602 Shortness of breath: Secondary | ICD-10-CM

## 2019-07-13 DIAGNOSIS — M546 Pain in thoracic spine: Secondary | ICD-10-CM | POA: Diagnosis not present

## 2019-07-13 DIAGNOSIS — I4819 Other persistent atrial fibrillation: Secondary | ICD-10-CM | POA: Diagnosis not present

## 2019-07-13 DIAGNOSIS — Z7189 Other specified counseling: Secondary | ICD-10-CM | POA: Diagnosis not present

## 2019-07-13 DIAGNOSIS — M961 Postlaminectomy syndrome, not elsewhere classified: Secondary | ICD-10-CM | POA: Diagnosis not present

## 2019-07-13 DIAGNOSIS — G4730 Sleep apnea, unspecified: Secondary | ICD-10-CM

## 2019-07-13 DIAGNOSIS — I714 Abdominal aortic aneurysm, without rupture, unspecified: Secondary | ICD-10-CM

## 2019-07-13 DIAGNOSIS — M5416 Radiculopathy, lumbar region: Secondary | ICD-10-CM | POA: Diagnosis not present

## 2019-07-13 DIAGNOSIS — I2510 Atherosclerotic heart disease of native coronary artery without angina pectoris: Secondary | ICD-10-CM

## 2019-07-13 DIAGNOSIS — I10 Essential (primary) hypertension: Secondary | ICD-10-CM | POA: Diagnosis not present

## 2019-07-13 DIAGNOSIS — M5136 Other intervertebral disc degeneration, lumbar region: Secondary | ICD-10-CM | POA: Diagnosis not present

## 2019-07-13 DIAGNOSIS — M545 Low back pain: Secondary | ICD-10-CM | POA: Diagnosis not present

## 2019-07-13 DIAGNOSIS — M5412 Radiculopathy, cervical region: Secondary | ICD-10-CM | POA: Diagnosis not present

## 2019-07-13 DIAGNOSIS — M542 Cervicalgia: Secondary | ICD-10-CM | POA: Diagnosis not present

## 2019-07-13 NOTE — Patient Instructions (Signed)
Medication Instructions:  °NO CHANGES °*If you need a refill on your cardiac medications before your next appointment, please call your pharmacy* ° °Lab Work: °NONE ORDERED THIS VISIT ° °Testing/Procedures: °NONE ORDERED THIS VISIT ° °Follow-Up: °At [facility], you and your health needs are our priority.  As part of our continuing mission to provide you with exceptional heart care, we have created designated Provider Care Teams.  These Care Teams include your primary Cardiologist (physician) and Advanced Practice Providers (APPs -  Physician Assistants and Nurse Practitioners) who all work together to provide you with the care you need, when you need it. ° °Your next appointment:   °6 month(s)  You will receive a reminder letter in the mail two months in advance. If you don't receive a letter, please call our office to schedule the follow-up appointment. ° °The format for your next appointment:   °In Person ° °Provider:   °James Hochrein, MD ° ° °

## 2019-07-18 ENCOUNTER — Telehealth: Payer: Self-pay | Admitting: Cardiology

## 2019-07-18 NOTE — Telephone Encounter (Signed)
   Pt wife's calling, she wanted to speak with Dr. Rosezella [place] nurse to discuss pt's last visit

## 2019-07-18 NOTE — Telephone Encounter (Signed)
Called and spoke with pts wife per dpr and reviewed avs with wife. She was asking if her husband was doing okay and stated that per AVS they had not made any changes or ordered any labs or test and they wanted him back for follow up in 6 months. Pts wife verbalized understanding. She states her husband is in a lot of pain in his back and he sees Dr.Ramos for his injections but his wife would like another referral from [place]. she states that they are fine with going out of town if he suggest a provider that is not in [place]. Notified I would send this message to Dr.Hochrein and his nurse for review. Wife verbalized understanding with no other questions at this time.

## 2019-07-26 ENCOUNTER — Other Ambulatory Visit: Payer: Self-pay | Admitting: Internal Medicine

## 2019-07-26 DIAGNOSIS — M542 Cervicalgia: Secondary | ICD-10-CM | POA: Diagnosis not present

## 2019-07-27 NOTE — Telephone Encounter (Signed)
Last OV 03/22/19 Next OV 09/19/19 Last RF 06/19/19

## 2019-07-31 ENCOUNTER — Telehealth: Payer: Self-pay | Admitting: *Deleted

## 2019-07-31 DIAGNOSIS — M546 Pain in thoracic spine: Secondary | ICD-10-CM | POA: Diagnosis not present

## 2019-07-31 DIAGNOSIS — M5136 Other intervertebral disc degeneration, lumbar region: Secondary | ICD-10-CM | POA: Diagnosis not present

## 2019-07-31 NOTE — Telephone Encounter (Signed)
   [facility] Pre-operative Risk Assessment    Request for surgical clearance:  1. What type of surgery is being performed? Thoracic ESI   2. When is this surgery scheduled? 08/04/19   3. What type of clearance is required (medical clearance vs. Pharmacy clearance to hold med vs. Both)? Both  4. Are there any medications that need to be held prior to surgery and how long? Warfarin for 5 days  5. Practice name and name of physician performing surgery? Suella Broad. MD [facility]   6. What is the office phone number? [phone]    7.   What is the office fax number? [phone]  8.   Anesthesia type (None, local, MAC, general) ? None listed   Ricci Barker 07/31/2019, 5:26 PM  _________________________________________________________________   (provider comments below)

## 2019-08-01 NOTE — Telephone Encounter (Signed)
Patient with diagnosis of afib on warfarin for anticoagulation.    Procedure:  Thoracic ESI Date of procedure: 08/04/19  CHADS2-VASc score of  5 (CHF, HTN, AGE, CAD, AGE)  Of note patient has a history of a provoked DVT/PE post back surgery in 2010.  CrCl 78 ml/min Platelet count 415  Per office protocol, patient can hold warfarin for 5 days prior to procedure.    Patient will NOT need bridging with Lovenox (enoxaparin) around procedure.

## 2019-08-02 NOTE — Telephone Encounter (Signed)
   Primary Cardiologist: Minus Breeding, MD  Chart reviewed as part of pre-operative protocol coverage. Given past medical history and time since last visit, based on ACC/AHA guidelines, VETO VANNI would be at acceptable risk for the planned procedure without further cardiovascular testing.   Patient with diagnosis of afib on warfarin for anticoagulation.  Procedure: Thoracic ESI  Date of procedure: 08/04/19  CHADS2-VASc score of 5 (CHF, HTN, AGE, CAD, AGE)  Of note patient has a history of a provoked DVT/PE post back surgery in 2010.  CrCl 78 ml/min  Platelet count 415  Per office protocol, patient can hold warfarin for 5 days prior to procedure.  Patient will NOT need bridging with Lovenox (enoxaparin) around procedure.   I will route this recommendation to the requesting party via Epic fax function and remove from pre-op pool.  Please call with questions.  Jossie Ng. Cleaver NP-C    08/02/2019, 9:23 AM [address] Office [phone] Fax [phone]

## 2019-08-04 DIAGNOSIS — M5033 Other cervical disc degeneration, cervicothoracic region: Secondary | ICD-10-CM | POA: Diagnosis not present

## 2019-08-11 ENCOUNTER — Ambulatory Visit (INDEPENDENT_AMBULATORY_CARE_PROVIDER_SITE_OTHER): Payer: Medicare Other | Admitting: Pharmacist

## 2019-08-11 ENCOUNTER — Other Ambulatory Visit: Payer: Self-pay

## 2019-08-11 DIAGNOSIS — Z86718 Personal history of other venous thrombosis and embolism: Secondary | ICD-10-CM

## 2019-08-11 DIAGNOSIS — Z7901 Long term (current) use of anticoagulants: Secondary | ICD-10-CM | POA: Diagnosis not present

## 2019-08-11 DIAGNOSIS — I480 Paroxysmal atrial fibrillation: Secondary | ICD-10-CM | POA: Diagnosis not present

## 2019-08-11 LAB — POCT INR: INR: 2.7 (ref 2.0–3.0)

## 2019-08-18 DIAGNOSIS — M5136 Other intervertebral disc degeneration, lumbar region: Secondary | ICD-10-CM | POA: Diagnosis not present

## 2019-08-18 DIAGNOSIS — M5030 Other cervical disc degeneration, unspecified cervical region: Secondary | ICD-10-CM | POA: Diagnosis not present

## 2019-08-18 DIAGNOSIS — M961 Postlaminectomy syndrome, not elsewhere classified: Secondary | ICD-10-CM | POA: Diagnosis not present

## 2019-08-22 DIAGNOSIS — Z7901 Long term (current) use of anticoagulants: Secondary | ICD-10-CM | POA: Diagnosis not present

## 2019-08-22 DIAGNOSIS — I2782 Chronic pulmonary embolism: Secondary | ICD-10-CM | POA: Diagnosis not present

## 2019-08-22 DIAGNOSIS — I10 Essential (primary) hypertension: Secondary | ICD-10-CM | POA: Diagnosis not present

## 2019-08-22 DIAGNOSIS — E6609 Other obesity due to excess calories: Secondary | ICD-10-CM | POA: Diagnosis not present

## 2019-08-22 DIAGNOSIS — I480 Paroxysmal atrial fibrillation: Secondary | ICD-10-CM | POA: Diagnosis not present

## 2019-08-22 DIAGNOSIS — Z8679 Personal history of other diseases of the circulatory system: Secondary | ICD-10-CM | POA: Diagnosis not present

## 2019-08-22 DIAGNOSIS — R9431 Abnormal electrocardiogram [ECG] [EKG]: Secondary | ICD-10-CM | POA: Diagnosis not present

## 2019-08-22 DIAGNOSIS — Z955 Presence of coronary angioplasty implant and graft: Secondary | ICD-10-CM | POA: Diagnosis not present

## 2019-08-22 DIAGNOSIS — J449 Chronic obstructive pulmonary disease, unspecified: Secondary | ICD-10-CM | POA: Diagnosis not present

## 2019-08-22 DIAGNOSIS — I2510 Atherosclerotic heart disease of native coronary artery without angina pectoris: Secondary | ICD-10-CM | POA: Diagnosis not present

## 2019-08-22 DIAGNOSIS — I4819 Other persistent atrial fibrillation: Secondary | ICD-10-CM | POA: Diagnosis not present

## 2019-08-22 DIAGNOSIS — Z9889 Other specified postprocedural states: Secondary | ICD-10-CM | POA: Diagnosis not present

## 2019-08-22 DIAGNOSIS — I440 Atrioventricular block, first degree: Secondary | ICD-10-CM | POA: Diagnosis not present

## 2019-08-22 DIAGNOSIS — E782 Mixed hyperlipidemia: Secondary | ICD-10-CM | POA: Diagnosis not present

## 2019-09-08 ENCOUNTER — Telehealth: Payer: Self-pay | Admitting: Internal Medicine

## 2019-09-08 MED ORDER — VENLAFAXINE HCL ER 75 MG PO CP24: ORAL_CAPSULE | ORAL | 3 refills | Status: AC

## 2019-09-08 NOTE — Telephone Encounter (Signed)
sent 

## 2019-09-08 NOTE — Telephone Encounter (Signed)
New message:    1.Medication Requested: venlafaxine XR (EFFEXOR-XR) 75 MG 24 hr capsule 2. Pharmacy (Name, [address] [place]): [facility]  [address] [place] [address] [phone] 3. On Med List: yes  4. Last Visit with PCP:   5. Next visit date with PCP:  Pt states he is out of town and he has left this medication and can tell he hasn't had it and needs it to be sent to the pharmacy above.    Agent: Please be advised that RX refills may take up to 3 business days. We ask that you follow-up with your pharmacy.

## 2019-09-18 ENCOUNTER — Other Ambulatory Visit: Payer: Self-pay | Admitting: Internal Medicine

## 2019-09-19 ENCOUNTER — Ambulatory Visit: Payer: Medicare Other | Admitting: Internal Medicine

## 2019-10-01 NOTE — Patient Instructions (Addendum)
  Blood work was ordered.     Medications reviewed and updated.  Changes include :   Lorazepam for sleep - take only as needed  Your prescription(s) have been submitted to your pharmacy. Please take as directed and contact our office if you believe you are having problem(s) with the medication(s).   Please followup in 6 months

## 2019-10-01 NOTE — Progress Notes (Signed)
Subjective:    Patient ID: [name] [name], male    DOB: [birth year], 74 y.o.   MRN: [id number]  HPI The patient is here for follow up of their chronic medical problems, including prediabetes, CAD, htn, Afib, hyperlipidemia, insomnia, GERD, OSA, COPD   He is taking all of his medications as prescribed.    He is not exercising regularly.   His energy level is fair.   He does not sleep good some nights.  The lunesta does not work.  He sometimes uses his wife's clonazepam 1 mg but he sleeps until 12.   At the end of the day his left ankle is swollen - almost twice the size as the right.  It does not hurt.  It is not daily.   He has had injections in his lower back by Dr Nelva Bush.  Recently he has pain in neck.  An injection did not help.  He was on some   Medications  and is no longer an issue right now.and allergies reviewed with patient and updated if appropriate.  Patient Active Problem List   Diagnosis Date Noted  . Educated about COVID-19 virus infection 07/12/2019  . Acute diastolic congestive heart failure; exacerbated by recurrent atrial fibrillation  04/02/2019  . Atrial fibrillation ([place]) 03/31/2019  . Chronic diastolic HF (heart failure) ([place]) 12/05/2018  . Accelerating angina ([place]) 08/15/2018  . Olecranon bursitis of right elbow 07/25/2018  . Non-rheumatic mitral regurgitation 04/16/2017  . Bilateral leg edema 12/26/2016  . Anxiety 12/26/2016  . Scar tissue 11/02/2016  . Plantar fasciitis 10/06/2016  . Vitamin D deficiency 07/21/2016  . OSA (obstructive sleep apnea) 11/14/2015  . Prediabetes 04/17/2015  . COPD GOLD II 08/21/2014  . Hyperlipidemia 08/20/2014  . Spinal stenosis of lumbar region 05/30/2014  . Abnormal CT of the abdomen 05/30/2014  . Dizziness 02/08/2014  . CAD (coronary artery disease)   . AAA (abdominal aortic aneurysm) without rupture ([place]) 05/02/2013  . Obesity (BMI 30-39.9) 03/21/2013  . Family history of malignant neoplasm of gastrointestinal  tract 08/25/2011  . 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 Fatigue 03/24/2011  . DOE (dyspnea on exertion) 03/12/2011  . Warfarin anticoagulation 03/11/2011  . INSOMNIA-SLEEP DISORDER-UNSPEC 09/12/2009  . CERVICAL RADICULOPATHY, RIGHT 07/24/2009  . AVASCULAR NECROSIS 05/21/2009  . ALLERGIC RHINITIS CAUSE UNSPECIFIED 11/01/2008  . PULMONARY NODULE, RIGHT LOWER LOBE 10/25/2008  . DIVERTICULOSIS, COLON 10/07/2008  . Hypertrophic obstructive cardiomyopathy ([place]) 10/05/2008  . PULMONARY EMBOLISM, HX OF 10/05/2008  . ASTHMA 02/20/2008  . HYPERPLASIA PROSTATE UNS W/UR OBST & OTH LUTS 06/28/2007  . PAF (paroxysmal atrial fibrillation) ([place]) 06/25/2007  . COLONIC POLYPS 04/01/2006  . GOUT 04/01/2006  . Essential hypertension 04/01/2006  . ACID REFLUX DISEASE 04/01/2006    Current Outpatient Medications on File Prior to Visit  Medication Sig Dispense Refill  . allopurinol (ZYLOPRIM) 100 MG tablet Take 1 tablet (100 mg total) by mouth daily. 90 tablet 1  . aspirin 81 MG chewable tablet Chew 81 mg by mouth daily.     [name] atorvastatin (LIPITOR) 10 MG tablet TAKE 1 TABLET EVERY EVENING 90 tablet 3  . Cholecalciferol (VITAMIN D3 PO) Take 1 capsule by mouth daily.    . Fluticasone-Umeclidin-Vilant (TRELEGY ELLIPTA) 100-62.5-25 MCG/INH AEPB Inhale 1 puff into the lungs daily.    . metFORMIN (GLUCOPHAGE) 500 MG tablet TAKE 1 TABLET TWICE A DAY WITH MEALS 180 tablet 3  . metoprolol tartrate (LOPRESSOR) 25 MG tablet Take 1 tablet (25 mg total) by mouth 2 (  two) times daily. 180 tablet 3  . nitroGLYCERIN (NITROSTAT) 0.4 MG SL tablet Place 1 tablet (0.4 mg total) under the tongue every 5 (five) minutes as needed for chest pain (up to 3 doses). 25 tablet 4  . pantoprazole (PROTONIX) 40 MG tablet TAKE 1 TABLET DAILY (Patient taking differently: Take 40 mg by mouth daily. ) 90 tablet 3  . venlafaxine XR (EFFEXOR-XR) 75 MG 24 hr capsule TAKE 1 CAPSULE DAILY WITH BREAKFAST 90 capsule 3  . verapamil (CALAN-SR)  120 MG CR tablet Take 1 tablet (120 mg total) by mouth at bedtime. 90 tablet 3  . vitamin B-12 (CYANOCOBALAMIN) 1000 MCG tablet Take 1,000 mcg by mouth daily.    [name] warfarin (COUMADIN) 3 MG tablet Take 1-2 tablets (3-6 mg total) by mouth See admin instructions. 6mg  everyday except on Friday take 3mg     . albuterol (VENTOLIN HFA) 108 (90 Base) MCG/ACT inhaler Inhale 2 puffs into the lungs every 6 (six) hours as needed for wheezing or shortness of breath. (Patient not taking: Reported on 10/02/2019) 6.7 g 1   No current facility-administered medications on file prior to visit.    Past Medical History:  Diagnosis Date  . Arthritis   . Asthma   . BPH (benign prostatic hypertrophy)   . CAD (coronary artery disease)    a. 07/2013: s/p DES to LAD, normal LVF.  [name] Complication of anesthesia    "I got all kinds of hallucinations"  . COPD (chronic obstructive pulmonary disease) ([place])    a. 07/2013 PFT's mild airflow obstruction, no restriction, sev decrease in DLCO.  . Diabetes mellitus without complication ([place])   . Diverticulosis   . DVT (deep venous thrombosis) ([place])    a. 2010 Lower ext s/p back surgery.  [name] Dyspnea on exertion    a. 07/2013 PFT's mild airflow obstr   . GERD (gastroesophageal reflux disease)   . History of gout   . History of hiatal hernia   . Hx of echocardiogram 2015   Echo (06/2013): EF 60-65% normal wall motion, normal diastolic function, aortic sclerosis without stenosis, Trivial MR, mild SAM due to long, redundant mitral leaflets, mild RAE, normal RVSF  . Hypertension   . Macrocytic anemia    a. 07/2013: documented on prior labs.  . Obesity   . OSA (obstructive sleep apnea) 11/14/2015  . PAF (paroxysmal atrial fibrillation) ([place])    a. Flecainide discontinued 07/2013 in setting of CAD.; b. s/p PVI Ablation at [facility] (Dr Joseph Berkshire) 11/2014  . Pneumonia ~ 2010 X 1  . Pulmonary embolism ([place])    a. 2010 in setting of DVT post-op back surgery. b. Low prob VQ 07/2013.  [name]  Sleep apnea    on cpap  . Spinal stenosis    [place]  . Transaminitis    a. 07/2013: mild.  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 ANTERIOR LUMBAR [place] ARTHROPLASTY  03/2008   "spacer poped out; had to repair"  . CARDIOVERSION N/A 04/02/2019   Procedure: CARDIOVERSION;  Surgeon: Deboraha Sprang, MD;  Location: [facility] OR;  Service: Cardiovascular;  Laterality: N/A;  . CATARACT EXTRACTION W/ INTRAOCULAR LENS  IMPLANT, BILATERAL Bilateral   . COLONOSCOPY    . COLONOSCOPY W/ POLYPECTOMY  2004  . Colonoscopy with polypectomy  09/2011   2 tubular adenomas  . CORONARY ANGIOPLASTY WITH STENT PLACEMENT  08/08/2013   "1"  . JOINT REPLACEMENT    . LEFT AND  RIGHT HEART CATHETERIZATION WITH CORONARY ANGIOGRAM N/A 08/07/2013   Procedure: LEFT AND RIGHT HEART CATHETERIZATION WITH CORONARY ANGIOGRAM;  Surgeon: Peter M Martinique, MD;  Location: [facility] CATH LAB;  Service: Cardiovascular;  Laterality: N/A;  . LUMBAR FUSION  02/2008  . PERCUTANEOUS CORONARY STENT INTERVENTION (PCI-S)  08/07/2013   Procedure: PERCUTANEOUS CORONARY STENT INTERVENTION (PCI-S);  Surgeon: Peter M Martinique, MD;  Location: [facility] CATH LAB;  Service: Cardiovascular;;  . PVI ablation  11/2014   Dr. Venita Sheffield [facility]  . RIGHT/LEFT HEART CATH AND CORONARY ANGIOGRAPHY N/A 08/15/2018   Procedure: RIGHT/LEFT HEART CATH AND CORONARY ANGIOGRAPHY;  Surgeon: Nelva Bush, MD;  Location: [place] CV LAB;  Service: Cardiovascular;  Laterality: N/A;  . TOTAL HIP ARTHROPLASTY Bilateral   . VASECTOMY      Social History   Socioeconomic History  . Marital status: Married    Spouse name: Not on file  . Number of children: 4  . Years of education: Not on file  . Highest education level: Not on file  Occupational History  . Occupation: Retired from [employer]    Employer: RETIRED  Tobacco Use  . Smoking status: Former Smoker    Packs/day: 2.00    Years: 40.00    Pack years: 80.00     Types: Cigarettes    Quit date: 03/09/2000    Years since quitting: 19.5  . Smokeless tobacco: Never Used  . Tobacco comment: smoked 1964-2002, up to 3 ppd  Vaping Use  . Vaping Use: Never used  Substance and Sexual Activity  . Alcohol use: Yes    Alcohol/week: 2.0 - 3.0 standard drinks    Types: 2 - 3 Cans of beer per week    Comment: drinks beer several times weekly  . Drug use: No  . Sexual activity: Never    Comment: "sexually hx is none of your business" (02/08/2014)  Other Topics Concern  . Not on file  Social History Narrative  . Not on file   Social Determinants of Health   Financial Resource Strain:   . Difficulty of Paying Living Expenses:   Food Insecurity:   . Worried About Charity fundraiser in the Last Year:   . Arboriculturist in the Last Year:   Transportation Needs:   . Film/video editor (Medical):   [name] Lack of Transportation (Non-Medical):   Physical Activity:   . Days of Exercise per Week:   . Minutes of Exercise per Session:   Stress:   . Feeling of Stress :   Social Connections:   . Frequency of Communication with Friends and Family:   . Frequency of Social Gatherings with Friends and Family:   . Attends Religious Services:   . Active Member of Clubs or Organizations:   . Attends Archivist Meetings:   [name] Marital Status:     Family History  Problem Relation Age of Onset  . Heart attack Paternal Grandfather        Over 10  . Atrial fibrillation Mother   . COPD Mother   . Heart disease Father   . Colon cancer Father   . Heart attack Brother 56  . Heart disease Brother   . Alcohol abuse Brother 26       Died post liver transplant  . Heart attack Paternal Uncle        Less than 79  . Stroke Neg Hx   . Rectal cancer Neg Hx   . Stomach  cancer Neg Hx   . Esophageal cancer Neg Hx     Review of Systems  Constitutional: Negative for fever.  Respiratory: Positive for cough, shortness of breath (chronic, stable) and wheezing.     Cardiovascular: Positive for leg swelling (LLE). Negative for chest pain and palpitations.  Neurological: Positive for dizziness and light-headedness. Negative for headaches.       Objective:   Vitals:   10/02/19 1339  BP: (!) 134/70  Pulse: 54  Temp: 98 F (36.7 C)  SpO2: 97%   BP Readings from Last 3 Encounters:  10/02/19 (!) 134/70  07/13/19 130/83  06/16/19 (!) 144/80   Wt Readings from Last 3 Encounters:  10/02/19 (!) 260 lb (117.9 kg)  07/13/19 263 lb 12.8 oz (119.7 kg)  06/16/19 261 lb (118.4 kg)   Body mass index is 33.38 kg/m.   Physical Exam    Constitutional: Appears well-developed and well-nourished. No distress.  HENT:  Head: Normocephalic and atraumatic.  Neck: Neck supple. No tracheal deviation present. No thyromegaly present.  No cervical lymphadenopathy Cardiovascular: Normal rate, regular rhythm and normal heart sounds.   No murmur heard. No carotid bruit .  No edema Pulmonary/Chest: Effort normal and breath sounds normal. No respiratory distress. No has no wheezes. No rales.  Skin: Skin is warm and dry. Not diaphoretic.  Psychiatric: Normal mood and affect. Behavior is normal.      Assessment & Plan:    See Problem List for Assessment and Plan of chronic medical problems.    This visit occurred during the SARS-CoV-2 public health emergency.  Safety protocols were in place, including screening questions prior to the visit, additional usage of staff PPE, and extensive cleaning of exam room while observing appropriate contact time as indicated for disinfecting solutions.

## 2019-10-02 ENCOUNTER — Other Ambulatory Visit: Payer: Self-pay

## 2019-10-02 ENCOUNTER — Encounter: Payer: Self-pay | Admitting: Internal Medicine

## 2019-10-02 ENCOUNTER — Ambulatory Visit (INDEPENDENT_AMBULATORY_CARE_PROVIDER_SITE_OTHER): Payer: Medicare Other | Admitting: Internal Medicine

## 2019-10-02 VITALS — BP 134/70 | HR 54 | Temp 98.0°F | Ht 74.0 in | Wt 260.0 lb

## 2019-10-02 DIAGNOSIS — I10 Essential (primary) hypertension: Secondary | ICD-10-CM | POA: Diagnosis not present

## 2019-10-02 DIAGNOSIS — K219 Gastro-esophageal reflux disease without esophagitis: Secondary | ICD-10-CM

## 2019-10-02 DIAGNOSIS — F419 Anxiety disorder, unspecified: Secondary | ICD-10-CM

## 2019-10-02 DIAGNOSIS — E7849 Other hyperlipidemia: Secondary | ICD-10-CM

## 2019-10-02 DIAGNOSIS — G4709 Other insomnia: Secondary | ICD-10-CM | POA: Diagnosis not present

## 2019-10-02 DIAGNOSIS — R7303 Prediabetes: Secondary | ICD-10-CM

## 2019-10-02 DIAGNOSIS — M109 Gout, unspecified: Secondary | ICD-10-CM | POA: Diagnosis not present

## 2019-10-02 DIAGNOSIS — I2510 Atherosclerotic heart disease of native coronary artery without angina pectoris: Secondary | ICD-10-CM

## 2019-10-02 MED ORDER — LORAZEPAM 0.5 MG PO TABS: 0.5000 mg | ORAL_TABLET | Freq: Every evening | ORAL | 0 refills | Status: DC | PRN

## 2019-10-02 NOTE — Assessment & Plan Note (Signed)
Chronic Controlled, stable Continue current dose of medication Effexor 75 mg daily

## 2019-10-02 NOTE — Assessment & Plan Note (Addendum)
Chronic On allopurinol No gout symptoms since his last visit Continue allopurinol

## 2019-10-02 NOTE — Assessment & Plan Note (Signed)
Chronic Tracking his sleep issues sometimes, but not every night.  Often they occur when he takes a nap during the day, which he does try to avoid Will try Ativan 0.5 mg at night.-Advised only take this as needed Discussed potential side effects and consequences of long-term use

## 2019-10-02 NOTE — Assessment & Plan Note (Signed)
Chronic Check a1c Low sugar / carb diet Stressed regular exercise  

## 2019-10-02 NOTE — Assessment & Plan Note (Signed)
Chronic GERD controlled Continue daily medication Pantoprazole 40 mg daily 

## 2019-10-02 NOTE — Assessment & Plan Note (Signed)
Chronic BP well controlled Current regimen effective and well tolerated Continue current medications at current doses cmp  

## 2019-10-02 NOTE — Assessment & Plan Note (Signed)
Chronic Check lipid panel  Continue daily statin Regular exercise and healthy diet encouraged  

## 2019-10-03 DIAGNOSIS — M5412 Radiculopathy, cervical region: Secondary | ICD-10-CM | POA: Diagnosis not present

## 2019-10-03 DIAGNOSIS — M5030 Other cervical disc degeneration, unspecified cervical region: Secondary | ICD-10-CM | POA: Diagnosis not present

## 2019-10-03 LAB — COMPREHENSIVE METABOLIC PANEL
AG Ratio: 1.6 (calc) (ref 1.0–2.5)
ALT: 20 U/L (ref 9–46)
AST: 22 U/L (ref 10–35)
Albumin: 4.3 g/dL (ref 3.6–5.1)
Alkaline phosphatase (APISO): 83 U/L (ref 35–144)
BUN: 15 mg/dL (ref 7–25)
CO2: 26 mmol/L (ref 20–32)
Calcium: 9.4 mg/dL (ref 8.6–10.3)
Chloride: 104 mmol/L (ref 98–110)
Creat: 0.96 mg/dL (ref 0.70–1.18)
Globulin: 2.7 g/dL (calc) (ref 1.9–3.7)
Glucose, Bld: 75 mg/dL (ref 65–99)
Potassium: 5 mmol/L (ref 3.5–5.3)
Sodium: 138 mmol/L (ref 135–146)
Total Bilirubin: 0.6 mg/dL (ref 0.2–1.2)
Total Protein: 7 g/dL (ref 6.1–8.1)

## 2019-10-03 LAB — CBC WITH DIFFERENTIAL/PLATELET
Absolute Monocytes: 1135 cells/uL — ABNORMAL HIGH (ref 200–950)
Basophils Absolute: 121 cells/uL (ref 0–200)
Basophils Relative: 1.3 %
Eosinophils Absolute: 391 cells/uL (ref 15–500)
Eosinophils Relative: 4.2 %
HCT: 30.2 % — ABNORMAL LOW (ref 38.5–50.0)
Hemoglobin: 10.3 g/dL — ABNORMAL LOW (ref 13.2–17.1)
Lymphs Abs: 2985 cells/uL (ref 850–3900)
MCH: 36 pg — ABNORMAL HIGH (ref 27.0–33.0)
MCHC: 34.1 g/dL (ref 32.0–36.0)
MCV: 105.6 fL — ABNORMAL HIGH (ref 80.0–100.0)
MPV: 10.1 fL (ref 7.5–12.5)
Monocytes Relative: 12.2 %
Neutro Abs: 4669 cells/uL (ref 1500–7800)
Neutrophils Relative %: 50.2 %
Platelets: 469 10*3/uL — ABNORMAL HIGH (ref 140–400)
RBC: 2.86 10*6/uL — ABNORMAL LOW (ref 4.20–5.80)
RDW: 16 % — ABNORMAL HIGH (ref 11.0–15.0)
Total Lymphocyte: 32.1 %
WBC: 9.3 10*3/uL (ref 3.8–10.8)

## 2019-10-03 LAB — LIPID PANEL
Cholesterol: 96 mg/dL (ref <200)
HDL: 41 mg/dL (ref >=40)
LDL Cholesterol (Calc): 38 mg/dL (calc)
Non-HDL Cholesterol (Calc): 55 mg/dL (calc) (ref <130)
Total CHOL/HDL Ratio: 2.3 (calc) (ref <5.0)
Triglycerides: 86 mg/dL (ref <150)

## 2019-10-03 LAB — HEMOGLOBIN A1C
Hgb A1c MFr Bld: 5.7 % of total Hgb — ABNORMAL HIGH (ref <5.7)
Mean Plasma Glucose: 117 (calc)
eAG (mmol/L): 6.5 (calc)

## 2019-10-05 ENCOUNTER — Encounter: Payer: Self-pay | Admitting: Internal Medicine

## 2019-10-06 ENCOUNTER — Other Ambulatory Visit: Payer: Self-pay

## 2019-10-06 ENCOUNTER — Ambulatory Visit (INDEPENDENT_AMBULATORY_CARE_PROVIDER_SITE_OTHER): Payer: Medicare Other | Admitting: Pharmacist

## 2019-10-06 DIAGNOSIS — Z7901 Long term (current) use of anticoagulants: Secondary | ICD-10-CM | POA: Diagnosis not present

## 2019-10-06 DIAGNOSIS — I480 Paroxysmal atrial fibrillation: Secondary | ICD-10-CM | POA: Diagnosis not present

## 2019-10-06 DIAGNOSIS — Z86718 Personal history of other venous thrombosis and embolism: Secondary | ICD-10-CM | POA: Diagnosis not present

## 2019-10-06 LAB — POCT INR: INR: 2.1 (ref 2.0–3.0)

## 2019-10-06 MED ORDER — LORAZEPAM 0.5 MG PO TABS: 0.5000 mg | ORAL_TABLET | Freq: Every evening | ORAL | 0 refills | Status: AC | PRN

## 2019-10-06 NOTE — Addendum Note (Signed)
Addended by: Binnie Rail on: 10/06/2019 07:17 AM   Modules accepted: Orders

## 2019-10-20 ENCOUNTER — Inpatient Hospital Stay (HOSPITAL_COMMUNITY)
Admission: EM | Admit: 2019-10-20 | Discharge: 2019-11-08 | DRG: 871 | Disposition: E | Payer: Medicare Other | Attending: Internal Medicine | Admitting: Internal Medicine

## 2019-10-20 ENCOUNTER — Inpatient Hospital Stay (HOSPITAL_COMMUNITY): Payer: Medicare Other

## 2019-10-20 ENCOUNTER — Emergency Department (HOSPITAL_COMMUNITY): Payer: Medicare Other

## 2019-10-20 DIAGNOSIS — E119 Type 2 diabetes mellitus without complications: Secondary | ICD-10-CM | POA: Diagnosis present

## 2019-10-20 DIAGNOSIS — N400 Enlarged prostate without lower urinary tract symptoms: Secondary | ICD-10-CM | POA: Diagnosis present

## 2019-10-20 DIAGNOSIS — I110 Hypertensive heart disease with heart failure: Secondary | ICD-10-CM | POA: Diagnosis present

## 2019-10-20 DIAGNOSIS — Z9911 Dependence on respirator [ventilator] status: Secondary | ICD-10-CM | POA: Diagnosis not present

## 2019-10-20 DIAGNOSIS — I998 Other disorder of circulatory system: Secondary | ICD-10-CM | POA: Diagnosis not present

## 2019-10-20 DIAGNOSIS — J9690 Respiratory failure, unspecified, unspecified whether with hypoxia or hypercapnia: Secondary | ICD-10-CM | POA: Diagnosis not present

## 2019-10-20 DIAGNOSIS — K219 Gastro-esophageal reflux disease without esophagitis: Secondary | ICD-10-CM | POA: Diagnosis present

## 2019-10-20 DIAGNOSIS — Z825 Family history of asthma and other chronic lower respiratory diseases: Secondary | ICD-10-CM

## 2019-10-20 DIAGNOSIS — Z8249 Family history of ischemic heart disease and other diseases of the circulatory system: Secondary | ICD-10-CM

## 2019-10-20 DIAGNOSIS — Z86010 Personal history of colonic polyps: Secondary | ICD-10-CM

## 2019-10-20 DIAGNOSIS — Z6837 Body mass index (BMI) 37.0-37.9, adult: Secondary | ICD-10-CM

## 2019-10-20 DIAGNOSIS — A419 Sepsis, unspecified organism: Secondary | ICD-10-CM | POA: Diagnosis not present

## 2019-10-20 DIAGNOSIS — I5032 Chronic diastolic (congestive) heart failure: Secondary | ICD-10-CM | POA: Diagnosis present

## 2019-10-20 DIAGNOSIS — R6521 Severe sepsis with septic shock: Secondary | ICD-10-CM | POA: Diagnosis present

## 2019-10-20 DIAGNOSIS — R001 Bradycardia, unspecified: Secondary | ICD-10-CM | POA: Diagnosis present

## 2019-10-20 DIAGNOSIS — E1165 Type 2 diabetes mellitus with hyperglycemia: Secondary | ICD-10-CM | POA: Diagnosis not present

## 2019-10-20 DIAGNOSIS — I700 Atherosclerosis of aorta: Secondary | ICD-10-CM | POA: Diagnosis not present

## 2019-10-20 DIAGNOSIS — Z86711 Personal history of pulmonary embolism: Secondary | ICD-10-CM

## 2019-10-20 DIAGNOSIS — Z7901 Long term (current) use of anticoagulants: Secondary | ICD-10-CM

## 2019-10-20 DIAGNOSIS — N179 Acute kidney failure, unspecified: Secondary | ICD-10-CM | POA: Diagnosis not present

## 2019-10-20 DIAGNOSIS — R4182 Altered mental status, unspecified: Secondary | ICD-10-CM | POA: Diagnosis not present

## 2019-10-20 DIAGNOSIS — Z96643 Presence of artificial hip joint, bilateral: Secondary | ICD-10-CM | POA: Diagnosis present

## 2019-10-20 DIAGNOSIS — J449 Chronic obstructive pulmonary disease, unspecified: Secondary | ICD-10-CM | POA: Diagnosis present

## 2019-10-20 DIAGNOSIS — N289 Disorder of kidney and ureter, unspecified: Secondary | ICD-10-CM | POA: Diagnosis not present

## 2019-10-20 DIAGNOSIS — G8929 Other chronic pain: Secondary | ICD-10-CM | POA: Diagnosis present

## 2019-10-20 DIAGNOSIS — I10 Essential (primary) hypertension: Secondary | ICD-10-CM | POA: Diagnosis not present

## 2019-10-20 DIAGNOSIS — N1330 Unspecified hydronephrosis: Secondary | ICD-10-CM | POA: Diagnosis not present

## 2019-10-20 DIAGNOSIS — M1990 Unspecified osteoarthritis, unspecified site: Secondary | ICD-10-CM | POA: Diagnosis present

## 2019-10-20 DIAGNOSIS — R918 Other nonspecific abnormal finding of lung field: Secondary | ICD-10-CM | POA: Diagnosis not present

## 2019-10-20 DIAGNOSIS — J9600 Acute respiratory failure, unspecified whether with hypoxia or hypercapnia: Secondary | ICD-10-CM | POA: Diagnosis not present

## 2019-10-20 DIAGNOSIS — E875 Hyperkalemia: Secondary | ICD-10-CM | POA: Diagnosis present

## 2019-10-20 DIAGNOSIS — G9340 Encephalopathy, unspecified: Secondary | ICD-10-CM | POA: Diagnosis present

## 2019-10-20 DIAGNOSIS — Z9842 Cataract extraction status, left eye: Secondary | ICD-10-CM

## 2019-10-20 DIAGNOSIS — I421 Obstructive hypertrophic cardiomyopathy: Secondary | ICD-10-CM | POA: Diagnosis present

## 2019-10-20 DIAGNOSIS — J9602 Acute respiratory failure with hypercapnia: Secondary | ICD-10-CM | POA: Diagnosis not present

## 2019-10-20 DIAGNOSIS — Z978 Presence of other specified devices: Secondary | ICD-10-CM | POA: Diagnosis not present

## 2019-10-20 DIAGNOSIS — Z515 Encounter for palliative care: Secondary | ICD-10-CM | POA: Diagnosis not present

## 2019-10-20 DIAGNOSIS — G4733 Obstructive sleep apnea (adult) (pediatric): Secondary | ICD-10-CM | POA: Diagnosis present

## 2019-10-20 DIAGNOSIS — K5790 Diverticulosis of intestine, part unspecified, without perforation or abscess without bleeding: Secondary | ICD-10-CM | POA: Diagnosis not present

## 2019-10-20 DIAGNOSIS — Z20822 Contact with and (suspected) exposure to covid-19: Secondary | ICD-10-CM | POA: Diagnosis present

## 2019-10-20 DIAGNOSIS — N170 Acute kidney failure with tubular necrosis: Secondary | ICD-10-CM | POA: Diagnosis present

## 2019-10-20 DIAGNOSIS — R404 Transient alteration of awareness: Secondary | ICD-10-CM | POA: Diagnosis not present

## 2019-10-20 DIAGNOSIS — Z79899 Other long term (current) drug therapy: Secondary | ICD-10-CM

## 2019-10-20 DIAGNOSIS — Z880 Allergy status to penicillin: Secondary | ICD-10-CM

## 2019-10-20 DIAGNOSIS — M109 Gout, unspecified: Secondary | ICD-10-CM | POA: Diagnosis present

## 2019-10-20 DIAGNOSIS — Z961 Presence of intraocular lens: Secondary | ICD-10-CM | POA: Diagnosis present

## 2019-10-20 DIAGNOSIS — Z7984 Long term (current) use of oral hypoglycemic drugs: Secondary | ICD-10-CM

## 2019-10-20 DIAGNOSIS — Z955 Presence of coronary angioplasty implant and graft: Secondary | ICD-10-CM

## 2019-10-20 DIAGNOSIS — I714 Abdominal aortic aneurysm, without rupture: Secondary | ICD-10-CM | POA: Diagnosis present

## 2019-10-20 DIAGNOSIS — Z800 Family history of malignant neoplasm of digestive organs: Secondary | ICD-10-CM

## 2019-10-20 DIAGNOSIS — K7200 Acute and subacute hepatic failure without coma: Secondary | ICD-10-CM | POA: Diagnosis present

## 2019-10-20 DIAGNOSIS — E872 Acidosis: Secondary | ICD-10-CM | POA: Diagnosis present

## 2019-10-20 DIAGNOSIS — Z7982 Long term (current) use of aspirin: Secondary | ICD-10-CM

## 2019-10-20 DIAGNOSIS — I2510 Atherosclerotic heart disease of native coronary artery without angina pectoris: Secondary | ICD-10-CM | POA: Diagnosis present

## 2019-10-20 DIAGNOSIS — Z452 Encounter for adjustment and management of vascular access device: Secondary | ICD-10-CM

## 2019-10-20 DIAGNOSIS — E669 Obesity, unspecified: Secondary | ICD-10-CM | POA: Diagnosis present

## 2019-10-20 DIAGNOSIS — J189 Pneumonia, unspecified organism: Secondary | ICD-10-CM | POA: Diagnosis not present

## 2019-10-20 DIAGNOSIS — R34 Anuria and oliguria: Secondary | ICD-10-CM | POA: Diagnosis not present

## 2019-10-20 DIAGNOSIS — R578 Other shock: Secondary | ICD-10-CM | POA: Diagnosis not present

## 2019-10-20 DIAGNOSIS — E8729 Other acidosis: Secondary | ICD-10-CM

## 2019-10-20 DIAGNOSIS — J9601 Acute respiratory failure with hypoxia: Secondary | ICD-10-CM | POA: Diagnosis present

## 2019-10-20 DIAGNOSIS — Z9841 Cataract extraction status, right eye: Secondary | ICD-10-CM

## 2019-10-20 DIAGNOSIS — R4020 Unspecified coma: Secondary | ICD-10-CM | POA: Diagnosis not present

## 2019-10-20 DIAGNOSIS — Z66 Do not resuscitate: Secondary | ICD-10-CM | POA: Diagnosis not present

## 2019-10-20 DIAGNOSIS — E1129 Type 2 diabetes mellitus with other diabetic kidney complication: Secondary | ICD-10-CM | POA: Diagnosis not present

## 2019-10-20 DIAGNOSIS — Z86718 Personal history of other venous thrombosis and embolism: Secondary | ICD-10-CM

## 2019-10-20 DIAGNOSIS — R0902 Hypoxemia: Secondary | ICD-10-CM | POA: Diagnosis not present

## 2019-10-20 DIAGNOSIS — I440 Atrioventricular block, first degree: Secondary | ICD-10-CM | POA: Diagnosis present

## 2019-10-20 DIAGNOSIS — R21 Rash and other nonspecific skin eruption: Secondary | ICD-10-CM | POA: Diagnosis not present

## 2019-10-20 DIAGNOSIS — Z981 Arthrodesis status: Secondary | ICD-10-CM

## 2019-10-20 DIAGNOSIS — Z87891 Personal history of nicotine dependence: Secondary | ICD-10-CM

## 2019-10-20 DIAGNOSIS — T391X5A Adverse effect of 4-Aminophenol derivatives, initial encounter: Secondary | ICD-10-CM | POA: Diagnosis present

## 2019-10-20 DIAGNOSIS — I480 Paroxysmal atrial fibrillation: Secondary | ICD-10-CM | POA: Diagnosis not present

## 2019-10-20 LAB — POCT I-STAT 7, (LYTES, BLD GAS, ICA,H+H)
Acid-base deficit: 15 mmol/L — ABNORMAL HIGH (ref 0.0–2.0)
Bicarbonate: 14.3 mmol/L — ABNORMAL LOW (ref 20.0–28.0)
Calcium, Ion: 0.95 mmol/L — ABNORMAL LOW (ref 1.15–1.40)
HCT: 33 % — ABNORMAL LOW (ref 39.0–52.0)
Hemoglobin: 11.2 g/dL — ABNORMAL LOW (ref 13.0–17.0)
O2 Saturation: 90 %
Patient temperature: 95.1
Potassium: 5.9 mmol/L — ABNORMAL HIGH (ref 3.5–5.1)
Sodium: 139 mmol/L (ref 135–145)
TCO2: 16 mmol/L — ABNORMAL LOW (ref 22–32)
pCO2 arterial: 44.2 mmHg (ref 32.0–48.0)
pH, Arterial: 7.105 — CL (ref 7.350–7.450)
pO2, Arterial: 72 mmHg — ABNORMAL LOW (ref 83.0–108.0)

## 2019-10-20 LAB — URINALYSIS, ROUTINE W REFLEX MICROSCOPIC
Bilirubin Urine: NEGATIVE
Glucose, UA: NEGATIVE mg/dL
Ketones, ur: NEGATIVE mg/dL
Leukocytes,Ua: NEGATIVE
Nitrite: NEGATIVE
Protein, ur: NEGATIVE mg/dL
Specific Gravity, Urine: 1.01 (ref 1.005–1.030)
pH: 5 (ref 5.0–8.0)

## 2019-10-20 LAB — I-STAT ARTERIAL BLOOD GAS, ED
Acid-base deficit: 16 mmol/L — ABNORMAL HIGH (ref 0.0–2.0)
Bicarbonate: 15.7 mmol/L — ABNORMAL LOW (ref 20.0–28.0)
Calcium, Ion: 1.05 mmol/L — ABNORMAL LOW (ref 1.15–1.40)
HCT: 33 % — ABNORMAL LOW (ref 39.0–52.0)
Hemoglobin: 11.2 g/dL — ABNORMAL LOW (ref 13.0–17.0)
O2 Saturation: 100 %
Patient temperature: 94.9
Potassium: 6 mmol/L — ABNORMAL HIGH (ref 3.5–5.1)
Sodium: 137 mmol/L (ref 135–145)
TCO2: 18 mmol/L — ABNORMAL LOW (ref 22–32)
pCO2 arterial: 57.8 mmHg — ABNORMAL HIGH (ref 32.0–48.0)
pH, Arterial: 7.028 — CL (ref 7.350–7.450)
pO2, Arterial: 319 mmHg — ABNORMAL HIGH (ref 83.0–108.0)

## 2019-10-20 LAB — CBC
HCT: 36.1 % — ABNORMAL LOW (ref 39.0–52.0)
Hemoglobin: 10.7 g/dL — ABNORMAL LOW (ref 13.0–17.0)
MCH: 34.6 pg — ABNORMAL HIGH (ref 26.0–34.0)
MCHC: 29.6 g/dL — ABNORMAL LOW (ref 30.0–36.0)
MCV: 116.8 fL — ABNORMAL HIGH (ref 80.0–100.0)
Platelets: 451 10*3/uL — ABNORMAL HIGH (ref 150–400)
RBC: 3.09 MIL/uL — ABNORMAL LOW (ref 4.22–5.81)
RDW: 20.8 % — ABNORMAL HIGH (ref 11.5–15.5)
WBC: 27.8 10*3/uL — ABNORMAL HIGH (ref 4.0–10.5)
nRBC: 2.5 % — ABNORMAL HIGH (ref 0.0–0.2)

## 2019-10-20 LAB — PROTIME-INR
INR: 2.3 — ABNORMAL HIGH (ref 0.8–1.2)
INR: 2 — ABNORMAL HIGH (ref 0.8–1.2)
Prothrombin Time: 24.6 seconds — ABNORMAL HIGH (ref 11.4–15.2)
Prothrombin Time: 21.7 seconds — ABNORMAL HIGH (ref 11.4–15.2)

## 2019-10-20 LAB — COMPREHENSIVE METABOLIC PANEL
ALT: 971 U/L — ABNORMAL HIGH (ref 0–44)
ALT: 1234 U/L — ABNORMAL HIGH (ref 0–44)
AST: 931 U/L — ABNORMAL HIGH (ref 15–41)
AST: 1550 U/L — ABNORMAL HIGH (ref 15–41)
Albumin: 3.6 g/dL (ref 3.5–5.0)
Albumin: 2.8 g/dL — ABNORMAL LOW (ref 3.5–5.0)
Alkaline Phosphatase: 107 U/L (ref 38–126)
Alkaline Phosphatase: 100 U/L (ref 38–126)
Anion gap: 31 — ABNORMAL HIGH (ref 5–15)
Anion gap: 17 — ABNORMAL HIGH (ref 5–15)
BUN: 21 mg/dL (ref 8–23)
BUN: 22 mg/dL (ref 8–23)
CO2: 9 mmol/L — ABNORMAL LOW (ref 22–32)
CO2: 16 mmol/L — ABNORMAL LOW (ref 22–32)
Calcium: 8.7 mg/dL — ABNORMAL LOW (ref 8.9–10.3)
Calcium: 7.5 mg/dL — ABNORMAL LOW (ref 8.9–10.3)
Chloride: 100 mmol/L (ref 98–111)
Chloride: 106 mmol/L (ref 98–111)
Creatinine, Ser: 3.31 mg/dL — ABNORMAL HIGH (ref 0.61–1.24)
Creatinine, Ser: 3.21 mg/dL — ABNORMAL HIGH (ref 0.61–1.24)
GFR calc Af Amer: 20 mL/min — ABNORMAL LOW (ref >60)
GFR calc Af Amer: 21 mL/min — ABNORMAL LOW (ref >60)
GFR calc non Af Amer: 17 mL/min — ABNORMAL LOW (ref >60)
GFR calc non Af Amer: 18 mL/min — ABNORMAL LOW (ref >60)
Glucose, Bld: 314 mg/dL — ABNORMAL HIGH (ref 70–99)
Glucose, Bld: 175 mg/dL — ABNORMAL HIGH (ref 70–99)
Potassium: 6.5 mmol/L (ref 3.5–5.1)
Potassium: 7 mmol/L (ref 3.5–5.1)
Sodium: 140 mmol/L (ref 135–145)
Sodium: 139 mmol/L (ref 135–145)
Total Bilirubin: 0.7 mg/dL (ref 0.3–1.2)
Total Bilirubin: 0.8 mg/dL (ref 0.3–1.2)
Total Protein: 7.1 g/dL (ref 6.5–8.1)
Total Protein: 5.8 g/dL — ABNORMAL LOW (ref 6.5–8.1)

## 2019-10-20 LAB — ACETAMINOPHEN LEVEL
Acetaminophen (Tylenol), Serum: 89 ug/mL — ABNORMAL HIGH (ref 10–30)
Acetaminophen (Tylenol), Serum: 58 ug/mL — ABNORMAL HIGH (ref 10–30)

## 2019-10-20 LAB — CBC WITH DIFFERENTIAL/PLATELET
Abs Immature Granulocytes: 1.4 10*3/uL — ABNORMAL HIGH (ref 0.00–0.07)
Band Neutrophils: 2 %
Basophils Absolute: 0 10*3/uL (ref 0.0–0.1)
Basophils Relative: 0 %
Eosinophils Absolute: 0 10*3/uL (ref 0.0–0.5)
Eosinophils Relative: 0 %
HCT: 38.4 % — ABNORMAL LOW (ref 39.0–52.0)
Hemoglobin: 10.3 g/dL — ABNORMAL LOW (ref 13.0–17.0)
Lymphocytes Relative: 18 %
Lymphs Abs: 4.3 10*3/uL — ABNORMAL HIGH (ref 0.7–4.0)
MCH: 36 pg — ABNORMAL HIGH (ref 26.0–34.0)
MCHC: 26.8 g/dL — ABNORMAL LOW (ref 30.0–36.0)
MCV: 134.3 fL — ABNORMAL HIGH (ref 80.0–100.0)
Metamyelocytes Relative: 2 %
Monocytes Absolute: 2.2 10*3/uL — ABNORMAL HIGH (ref 0.1–1.0)
Monocytes Relative: 9 %
Myelocytes: 4 %
Neutro Abs: 16 10*3/uL — ABNORMAL HIGH (ref 1.7–7.7)
Neutrophils Relative %: 65 %
Platelets: 623 10*3/uL — ABNORMAL HIGH (ref 150–400)
RBC: 2.86 MIL/uL — ABNORMAL LOW (ref 4.22–5.81)
RDW: 18.2 % — ABNORMAL HIGH (ref 11.5–15.5)
WBC: 23.9 10*3/uL — ABNORMAL HIGH (ref 4.0–10.5)
nRBC: 0 /100 WBC
nRBC: 2.6 % — ABNORMAL HIGH (ref 0.0–0.2)

## 2019-10-20 LAB — RAPID URINE DRUG SCREEN, HOSP PERFORMED
Amphetamines: NOT DETECTED
Barbiturates: NOT DETECTED
Benzodiazepines: NOT DETECTED
Cocaine: NOT DETECTED
Opiates: POSITIVE — AB
Tetrahydrocannabinol: NOT DETECTED

## 2019-10-20 LAB — CBG MONITORING, ED
Glucose-Capillary: 247 mg/dL — ABNORMAL HIGH (ref 70–99)
Glucose-Capillary: 169 mg/dL — ABNORMAL HIGH (ref 70–99)

## 2019-10-20 LAB — PREPARE RBC (CROSSMATCH)

## 2019-10-20 LAB — LACTIC ACID, PLASMA
Lactic Acid, Venous: >11 mmol/L (ref 0.5–1.9)
Lactic Acid, Venous: 9.4 mmol/L (ref 0.5–1.9)

## 2019-10-20 LAB — AMMONIA: Ammonia: 148 umol/L — ABNORMAL HIGH (ref 9–35)

## 2019-10-20 LAB — GLUCOSE, CAPILLARY: Glucose-Capillary: 135 mg/dL — ABNORMAL HIGH (ref 70–99)

## 2019-10-20 LAB — SALICYLATE LEVEL: Salicylate Lvl: <7 mg/dL — ABNORMAL LOW (ref 7.0–30.0)

## 2019-10-20 LAB — APTT: aPTT: 56 seconds — ABNORMAL HIGH (ref 24–36)

## 2019-10-20 LAB — SARS CORONAVIRUS 2 BY RT PCR (HOSPITAL ORDER, PERFORMED IN ~~LOC~~ HOSPITAL LAB): SARS Coronavirus 2: NEGATIVE

## 2019-10-20 MED ORDER — LACTATED RINGERS IV SOLN: INTRAVENOUS | Status: DC

## 2019-10-20 MED ORDER — POLYETHYLENE GLYCOL 3350 17 G PO PACK: 17.0000 g | PACK | Freq: Every day | ORAL | Status: DC | PRN

## 2019-10-20 MED ORDER — ACETYLCYSTEINE LOAD VIA INFUSION
150.0000 mg/kg | Freq: Once | INTRAVENOUS | Status: AC
  Administered 2019-10-20: 14805 mg via INTRAVENOUS
  Filled 2019-10-20: qty 371

## 2019-10-20 MED ORDER — ALBUTEROL SULFATE (2.5 MG/3ML) 0.083% IN NEBU: 2.5000 mg | INHALATION_SOLUTION | RESPIRATORY_TRACT | Status: DC | PRN

## 2019-10-20 MED ORDER — INSULIN ASPART 100 UNIT/ML ~~LOC~~ SOLN
5.0000 [IU] | Freq: Once | SUBCUTANEOUS | Status: AC
  Administered 2019-10-20: 5 [IU] via SUBCUTANEOUS

## 2019-10-20 MED ORDER — CHLORHEXIDINE GLUCONATE CLOTH 2 % EX PADS
6.0000 | MEDICATED_PAD | Freq: Every day | CUTANEOUS | Status: DC
  Administered 2019-10-21: 6 via TOPICAL

## 2019-10-20 MED ORDER — FENTANYL CITRATE (PF) 100 MCG/2ML IJ SOLN: 25.0000 ug | INTRAMUSCULAR | Status: DC | PRN

## 2019-10-20 MED ORDER — VANCOMYCIN HCL 2000 MG/400ML IV SOLN
2000.0000 mg | Freq: Once | INTRAVENOUS | Status: AC
  Administered 2019-10-20: 2000 mg via INTRAVENOUS
  Filled 2019-10-20: qty 400

## 2019-10-20 MED ORDER — INSULIN ASPART 100 UNIT/ML IV SOLN: 5.0000 [IU] | Freq: Once | INTRAVENOUS | Status: DC

## 2019-10-20 MED ORDER — FENTANYL 2500MCG IN NS 250ML (10MCG/ML) PREMIX INFUSION: 25.0000 ug/h | INTRAVENOUS | Status: DC

## 2019-10-20 MED ORDER — SODIUM BICARBONATE 8.4 % IV SOLN
50.0000 meq | Freq: Once | INTRAVENOUS | Status: AC
  Administered 2019-10-20: 50 meq via INTRAVENOUS
  Filled 2019-10-20: qty 50

## 2019-10-20 MED ORDER — ORAL CARE MOUTH RINSE
15.0000 mL | OROMUCOSAL | Status: DC
  Administered 2019-10-21 – 2019-10-22 (×19): 15 mL via OROMUCOSAL

## 2019-10-20 MED ORDER — ORAL CARE MOUTH RINSE: 15.0000 mL | OROMUCOSAL | Status: DC

## 2019-10-20 MED ORDER — PROPOFOL 1000 MG/100ML IV EMUL
INTRAVENOUS | Status: AC
  Administered 2019-10-20: 5 ug/kg/min via INTRAVENOUS
  Filled 2019-10-20: qty 100

## 2019-10-20 MED ORDER — SODIUM ZIRCONIUM CYCLOSILICATE 10 G PO PACK
10.0000 g | PACK | Freq: Once | ORAL | Status: AC
  Administered 2019-10-20: 10 g
  Filled 2019-10-20: qty 1

## 2019-10-20 MED ORDER — CHLORHEXIDINE GLUCONATE 0.12% ORAL RINSE (MEDLINE KIT)
15.0000 mL | Freq: Two times a day (BID) | OROMUCOSAL | Status: DC
  Administered 2019-10-21 – 2019-10-22 (×4): 15 mL via OROMUCOSAL

## 2019-10-20 MED ORDER — CALCIUM GLUCONATE-NACL 1-0.675 GM/50ML-% IV SOLN
1.0000 g | Freq: Once | INTRAVENOUS | Status: AC
  Administered 2019-10-20: 1000 mg via INTRAVENOUS
  Filled 2019-10-20: qty 50

## 2019-10-20 MED ORDER — SODIUM CHLORIDE 0.9 % IV SOLN: INTRAVENOUS | Status: DC | PRN

## 2019-10-20 MED ORDER — PANTOPRAZOLE SODIUM 40 MG IV SOLR
40.0000 mg | Freq: Every day | INTRAVENOUS | Status: DC
  Administered 2019-10-21 – 2019-10-22 (×2): 40 mg via INTRAVENOUS
  Filled 2019-10-20 (×2): qty 40

## 2019-10-20 MED ORDER — FENTANYL CITRATE (PF) 100 MCG/2ML IJ SOLN
INTRAMUSCULAR | Status: AC
  Filled 2019-10-20: qty 2

## 2019-10-20 MED ORDER — FENTANYL BOLUS VIA INFUSION
25.0000 ug | INTRAVENOUS | Status: DC | PRN
Start: 2019-10-20 — End: 2019-10-21

## 2019-10-20 MED ORDER — POLYETHYLENE GLYCOL 3350 17 G PO PACK
17.0000 g | PACK | Freq: Every day | ORAL | Status: DC
  Filled 2019-10-20 (×2): qty 1

## 2019-10-20 MED ORDER — LACTATED RINGERS IV BOLUS
1000.0000 mL | Freq: Once | INTRAVENOUS | Status: AC
  Administered 2019-10-20: 1000 mL via INTRAVENOUS

## 2019-10-20 MED ORDER — CHLORHEXIDINE GLUCONATE 0.12% ORAL RINSE (MEDLINE KIT): 15.0000 mL | Freq: Two times a day (BID) | OROMUCOSAL | Status: DC

## 2019-10-20 MED ORDER — SODIUM CHLORIDE 0.9 % IV SOLN
80.0000 mg | Freq: Once | INTRAVENOUS | Status: AC
  Administered 2019-10-20: 80 mg via INTRAVENOUS
  Filled 2019-10-20: qty 80

## 2019-10-20 MED ORDER — DEXTROSE 5 % IV SOLN
15.0000 mg/kg/h | INTRAVENOUS | Status: DC
  Administered 2019-10-20 – 2019-10-22 (×4): 15 mg/kg/h via INTRAVENOUS
  Filled 2019-10-20 (×6): qty 120

## 2019-10-20 MED ORDER — IPRATROPIUM-ALBUTEROL 0.5-2.5 (3) MG/3ML IN SOLN
3.0000 mL | Freq: Four times a day (QID) | RESPIRATORY_TRACT | Status: DC
  Administered 2019-10-21 – 2019-10-22 (×5): 3 mL via RESPIRATORY_TRACT
  Filled 2019-10-20 (×5): qty 3

## 2019-10-20 MED ORDER — VANCOMYCIN HCL IN DEXTROSE 1-5 GM/200ML-% IV SOLN: 1000.0000 mg | Freq: Once | INTRAVENOUS | Status: DC

## 2019-10-20 MED ORDER — SODIUM CHLORIDE 0.9 % IV SOLN: 10.0000 mL/h | Freq: Once | INTRAVENOUS | Status: DC

## 2019-10-20 MED ORDER — INSULIN ASPART 100 UNIT/ML ~~LOC~~ SOLN
0.0000 [IU] | SUBCUTANEOUS | Status: DC
  Administered 2019-10-21 (×2): 2 [IU] via SUBCUTANEOUS

## 2019-10-20 MED ORDER — NOREPINEPHRINE 4 MG/250ML-% IV SOLN
INTRAVENOUS | Status: AC
  Administered 2019-10-21: 19 ug/min via INTRAVENOUS
  Filled 2019-10-20: qty 250

## 2019-10-20 MED ORDER — NOREPINEPHRINE 4 MG/250ML-% IV SOLN
0.0000 ug/min | INTRAVENOUS | Status: AC
  Administered 2019-10-21: 26 ug/min via INTRAVENOUS
  Administered 2019-10-21: 22 ug/min via INTRAVENOUS
  Filled 2019-10-20 (×5): qty 250

## 2019-10-20 MED ORDER — FENTANYL CITRATE (PF) 100 MCG/2ML IJ SOLN
25.0000 ug | Freq: Once | INTRAMUSCULAR | Status: DC
Start: 2019-10-20 — End: 2019-10-21

## 2019-10-20 MED ORDER — LACTULOSE 10 GM/15ML PO SOLN
30.0000 g | Freq: Once | ORAL | Status: AC
  Administered 2019-10-21: 30 g
  Filled 2019-10-20 (×2): qty 45

## 2019-10-20 MED ORDER — FENTANYL CITRATE (PF) 100 MCG/2ML IJ SOLN: 100.0000 ug | Freq: Once | INTRAMUSCULAR | Status: DC

## 2019-10-20 MED ORDER — PROPOFOL 1000 MG/100ML IV EMUL
0.0000 ug/kg/min | INTRAVENOUS | Status: DC
  Administered 2019-10-21: 15 ug/kg/min via INTRAVENOUS
  Filled 2019-10-20: qty 100

## 2019-10-20 MED ORDER — SODIUM CHLORIDE 0.9 % IV SOLN
2.0000 g | Freq: Once | INTRAVENOUS | Status: AC
  Administered 2019-10-20: 2 g via INTRAVENOUS
  Filled 2019-10-20: qty 2

## 2019-10-20 MED ORDER — DOCUSATE SODIUM 50 MG/5ML PO LIQD
100.0000 mg | Freq: Two times a day (BID) | ORAL | Status: DC
  Filled 2019-10-20 (×3): qty 10

## 2019-10-20 MED ORDER — DOCUSATE SODIUM 100 MG PO CAPS: 100.0000 mg | ORAL_CAPSULE | Freq: Two times a day (BID) | ORAL | Status: DC | PRN

## 2019-10-20 NOTE — ED Notes (Signed)
Report called to 4N ICU

## 2019-10-20 NOTE — Progress Notes (Signed)
   10/08/2019 1700  Clinical Encounter Type  Visited With Family;Health care provider  Visit Type ED (Unresponsive)  Referral From Nurse  Consult/Referral To Chaplain   Chaplain was in ED when pt arrived. Chaplain was notified by security that family was present. Family was placed in consult A. Chaplain was present with wife while MD gave wife an update and asked questions to assist with further care of the pt. Wife was appropriately emotional, and a little confused or mind boggled. Chaplain was not comfortable leaving wife alone and requested the wife's neighbor who brought her to [facility] be allowed to sit with her until she felt more secure. Chaplain's shift was ending and Chaplain passed case off to [place].   Chaplain Resident, Evelene Croon, M Div [phone] on-call pager

## 2019-10-20 NOTE — ED Notes (Signed)
CCM at bedside for art line and CVC

## 2019-10-20 NOTE — ED Provider Notes (Signed)
[facility] EMERGENCY DEPARTMENT Provider Note   CSN: [id number] Arrival date & time:        History No chief complaint on file.   [name] [name] is a 74 y.o. male.  The history is provided by the EMS personnel.  Loss of Consciousness Episode history:  Single Most recent episode:  Today Duration:  30 minutes Timing:  Constant Progression:  Unchanged Chronicity:  New Witnessed: no   Relieved by:  Nothing Worsened by:  Nothing Ineffective treatments:  None tried Associated symptoms comment:  Unknown    Past Medical History:  Diagnosis Date  . Arthritis   . Asthma   . BPH (benign prostatic hypertrophy)   . CAD (coronary artery disease)    a. 07/2013: s/p DES to LAD, normal LVF.  [name] Complication of anesthesia    "I got all kinds of hallucinations"  . COPD (chronic obstructive pulmonary disease) ([place])    a. 07/2013 PFT's mild airflow obstruction, no restriction, sev decrease in DLCO.  . Diabetes mellitus without complication ([place])   . Diverticulosis   . DVT (deep venous thrombosis) ([place])    a. 2010 Lower ext s/p back surgery.  [name] Dyspnea on exertion    a. 07/2013 PFT's mild airflow obstr   . GERD (gastroesophageal reflux disease)   . History of gout   . History of hiatal hernia   . Hx of echocardiogram 2015   Echo (06/2013): EF 60-65% normal wall motion, normal diastolic function, aortic sclerosis without stenosis, Trivial MR, mild SAM due to long, redundant mitral leaflets, mild RAE, normal RVSF  . Hypertension   . Macrocytic anemia    a. 07/2013: documented on prior labs.  . Obesity   . OSA (obstructive sleep apnea) 11/14/2015  . PAF (paroxysmal atrial fibrillation) ([place])    a. Flecainide discontinued 07/2013 in setting of CAD.; b. s/p PVI Ablation at [facility] (Dr Joseph Berkshire) 11/2014  . Pneumonia ~ 2010 X 1  . Pulmonary embolism ([place])    a. 2010 in setting of DVT post-op back surgery. b. Low prob VQ 07/2013.  [name] Sleep apnea    on cpap  . Spinal  stenosis    Congential  . Transaminitis    a. 07/2013: mild.  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 Respiratory failure ([place]) 10/28/2019  . Endotracheal tube present   . High anion gap metabolic acidosis   . Educated about COVID-19 virus infection 07/12/2019  . Acute diastolic congestive heart failure; exacerbated by recurrent atrial fibrillation  04/02/2019  . Atrial fibrillation ([place]) 03/31/2019  . Chronic diastolic HF (heart failure) ([place]) 12/05/2018  . Accelerating angina ([place]) 08/15/2018  . Olecranon bursitis of right elbow 07/25/2018  . Non-rheumatic mitral regurgitation 04/16/2017  . Bilateral leg edema 12/26/2016  . Anxiety 12/26/2016  . Scar tissue 11/02/2016  . Plantar fasciitis 10/06/2016  . Vitamin D deficiency 07/21/2016  . OSA (obstructive sleep apnea) 11/14/2015  . Septic shock ([place]) 10/24/2015  . Prediabetes 04/17/2015  . COPD GOLD II 08/21/2014  . Hyperlipidemia 08/20/2014  . Spinal stenosis of lumbar region 05/30/2014  . Abnormal CT of the abdomen 05/30/2014  . Dizziness 02/08/2014  . CAD (coronary artery disease)   . AAA (abdominal aortic aneurysm) without rupture ([place]) 05/02/2013  . Obesity (BMI 30-39.9) 03/21/2013  . Family history of malignant neoplasm of gastrointestinal tract 08/25/2011  . 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 Fatigue 03/24/2011  .  DOE (dyspnea on exertion) 03/12/2011  . Warfarin anticoagulation 03/11/2011  . Encounter for central line placement 08/29/2010  . INSOMNIA-SLEEP DISORDER-UNSPEC 09/12/2009  . CERVICAL RADICULOPATHY, RIGHT 07/24/2009  . AVASCULAR NECROSIS 05/21/2009  . ALLERGIC RHINITIS CAUSE UNSPECIFIED 11/01/2008  . PULMONARY NODULE, RIGHT LOWER LOBE 10/25/2008  . DIVERTICULOSIS, COLON 10/07/2008  . 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 PULMONARY EMBOLISM, HX OF 10/05/2008  . ASTHMA 02/20/2008  . HYPERPLASIA PROSTATE UNS W/UR OBST &  OTH LUTS 06/28/2007  . PAF (paroxysmal atrial fibrillation) ([place]) 06/25/2007  . COLONIC POLYPS 04/01/2006  . GOUT 04/01/2006  . Essential hypertension 04/01/2006  . ACID REFLUX DISEASE 04/01/2006    Past Surgical History:  Procedure Laterality Date  . ANTERIOR LUMBAR [place] ARTHROPLASTY  03/2008   "spacer poped out; had to repair"  . CARDIOVERSION N/A 04/02/2019   Procedure: CARDIOVERSION;  Surgeon: Deboraha Sprang, MD;  Location[facility] OR;  Service: Cardiovascular;  Laterality: N/A;  . CATARACT EXTRACTION W/ INTRAOCULAR LENS  IMPLANT, BILATERAL Bilateral   . COLONOSCOPY    . COLONOSCOPY W/ POLYPECTOMY  2004  . Colonoscopy with polypectomy  09/2011   2 tubular adenomas  . CORONARY ANGIOPLASTY WITH STENT PLACEMENT  08/08/2013   "1"  . JOINT REPLACEMENT    . LEFT AND RIGHT HEART CATHETERIZATION WITH CORONARY ANGIOGRAM N/A 08/07/2013   Procedure: LEFT AND RIGHT HEART CATHETERIZATION WITH CORONARY ANGIOGRAM;  Surgeon: Peter M Martinique, MD;  Location: [facility] CATH LAB;  Service: Cardiovascular;  Laterality: N/A;  . LUMBAR FUSION  02/2008  . PERCUTANEOUS CORONARY STENT INTERVENTION (PCI-S)  08/07/2013   Procedure: PERCUTANEOUS CORONARY STENT INTERVENTION (PCI-S);  Surgeon: Peter M Martinique, MD;  Location: [facility] CATH LAB;  Service: Cardiovascular;;  . PVI ablation  11/2014   Dr. Venita Sheffield [facility]  . RIGHT/LEFT HEART CATH AND CORONARY ANGIOGRAPHY N/A 08/15/2018   Procedure: RIGHT/LEFT HEART CATH AND CORONARY ANGIOGRAPHY;  Surgeon: Nelva Bush, MD;  Location: [facility] CV LAB;  Service: Cardiovascular;  Laterality: N/A;  . TOTAL HIP ARTHROPLASTY Bilateral   . VASECTOMY         Family History  Problem Relation Age of Onset  . Heart attack Paternal Grandfather        Over 43  . Atrial fibrillation Mother   . COPD Mother   . Heart disease Father   . Colon cancer Father   . Heart attack Brother 65  . Heart disease Brother   . Alcohol abuse Brother 44       Died post liver transplant  .  Heart attack Paternal Uncle        Less than 80  . Stroke Neg Hx   . Rectal cancer Neg Hx   . Stomach cancer Neg Hx   . Esophageal cancer Neg Hx     Social History   Tobacco Use  . Smoking status: Former Smoker    Packs/day: 2.00    Years: 40.00    Pack years: 80.00    Types: Cigarettes    Quit date: 03/09/2000    Years since quitting: 19.6  . Smokeless tobacco: Never Used  . Tobacco comment: smoked 1964-2002, up to 3 ppd  Vaping Use  . Vaping Use: Never used  Substance Use Topics  . Alcohol use: Yes    Alcohol/week: 2.0 - 3.0 standard drinks    Types: 2 - 3 Cans of beer per week    Comment: drinks beer several times weekly  . Drug use: No  Home Medications Prior to Admission medications   Medication Sig Start Date End Date Taking? Authorizing Provider  albuterol (VENTOLIN HFA) 108 (90 Base) MCG/ACT inhaler Inhale 2 puffs into the lungs every 6 (six) hours as needed for wheezing or shortness of breath.   Yes [provider]  allopurinol (ZYLOPRIM) 100 MG tablet Take 1 tablet (100 mg total) by mouth daily. Patient taking differently: Take 100 mg by mouth daily as needed (Gout).  06/16/19  Yes Burns, Claudina Lick, MD  aspirin 81 MG chewable tablet Chew 81 mg by mouth daily.  11/18/14  Yes [provider]  atorvastatin (LIPITOR) 10 MG tablet TAKE 1 TABLET EVERY EVENING Patient taking differently: Take 10 mg by mouth at bedtime.  05/23/19  Yes Minus Breeding, MD  Cholecalciferol (VITAMIN D-3) 25 MCG (1000 UT) CAPS Take 2,000 Units by mouth daily.    Yes [provider]  eszopiclone (LUNESTA) 2 MG TABS tablet Take 2 mg by mouth at bedtime as needed for sleep.  10/11/19  Yes [provider]  Fluticasone-Umeclidin-Vilant (TRELEGY ELLIPTA) 100-62.5-25 MCG/INH AEPB Inhale 1 puff into the lungs daily.   Yes [provider]  LORazepam (ATIVAN) 0.5 MG tablet Take 1 tablet (0.5 mg total) by mouth at bedtime as needed for anxiety or sleep. 10/06/19  Yes  Burns, Claudina Lick, MD  metFORMIN (GLUCOPHAGE) 500 MG tablet TAKE 1 TABLET TWICE A DAY WITH MEALS Patient taking differently: Take 500 mg by mouth 2 (two) times daily with a meal.  09/18/19  Yes Burns, Claudina Lick, MD  metoprolol tartrate (LOPRESSOR) 25 MG tablet Take 1 tablet (25 mg total) by mouth 2 (two) times daily. 04/14/19  Yes Minus Breeding, MD  nitroGLYCERIN (NITROSTAT) 0.4 MG SL tablet Place 1 tablet (0.4 mg total) under the tongue every 5 (five) minutes as needed for chest pain (up to 3 doses). 01/09/15  Yes Minus Breeding, MD  oxyCODONE-acetaminophen (PERCOCET) 10-325 MG tablet Take 1 tablet by mouth 3 (three) times daily as needed. 10/03/19  Yes [provider]  pantoprazole (PROTONIX) 40 MG tablet TAKE 1 TABLET DAILY Patient taking differently: Take 40 mg by mouth daily as needed (Heartburn).  02/03/19  Yes Minus Breeding, MD  venlafaxine XR (EFFEXOR-XR) 75 MG 24 hr capsule TAKE 1 CAPSULE DAILY WITH BREAKFAST Patient taking differently: Take 75 mg by mouth daily with breakfast.  09/08/19  Yes Burns, Claudina Lick, MD  verapamil (CALAN-SR) 120 MG CR tablet Take 1 tablet (120 mg total) by mouth at bedtime. 04/14/19  Yes Minus Breeding, MD  vitamin B-12 (CYANOCOBALAMIN) 1000 MCG tablet Take 1,000 mcg by mouth daily.   Yes [provider]  warfarin (COUMADIN) 3 MG tablet Take 1-2 tablets (3-6 mg total) by mouth See admin instructions. 47m everyday except on Friday take 347m1/24/21  Yes Weaver, ScNicki Reaper, PA-C  Cholecalciferol (VITAMIN D3 PO) Take 1 capsule by mouth daily. Patient not taking: Reported on 10/18/2019    [provider]  metoprolol tartrate (LOPRESSOR) 50 MG tablet Take 2 tablets by mouth 2 (two) times daily. Patient not taking: Reported on 11/05/2019 10/10/18   [provider]    Allergies    Penicillins  Review of Systems   Review of Systems  Unable to perform ROS: Patient unresponsive  Cardiovascular: Positive for syncope.    Physical Exam Updated  Vital Signs BP (!) 145/48   Pulse 91   Temp 100.2 F (37.9 C)   Resp (!) 23   Ht '6\' 2"'  (1.88 m)  Wt 123.4 kg   SpO2 96%   BMI 34.94 kg/m   Physical Exam Vitals and nursing note reviewed.  Constitutional:      General: He is in acute distress.     Appearance: He is well-developed. He is ill-appearing and toxic-appearing.  HENT:     Head: Normocephalic and atraumatic.  Eyes:     Conjunctiva/sclera: Conjunctivae normal.  Cardiovascular:     Rate and Rhythm: Regular rhythm. Bradycardia present.     Heart sounds: No murmur heard.   Pulmonary:     Breath sounds: Normal breath sounds. No wheezing or rales.  Abdominal:     General: There is no distension.     Palpations: Abdomen is soft.     Tenderness: There is no guarding.  Musculoskeletal:        General: No deformity or signs of injury.     Cervical back: Neck supple.  Skin:    General: Skin is warm and dry.  Neurological:     Mental Status: He is unresponsive.     GCS: GCS eye subscore is 1. GCS verbal subscore is 1. GCS motor subscore is 1.  Psychiatric:     Comments: unresponsive     ED Results / Procedures / Treatments   Labs (all labs ordered are listed, but only abnormal results are displayed) Labs Reviewed  CBC WITH DIFFERENTIAL/PLATELET - Abnormal; Notable for the following components:      Result Value   WBC 23.9 (*)    RBC 2.86 (*)    Hemoglobin 10.3 (*)    HCT 38.4 (*)    MCV 134.3 (*)    MCH 36.0 (*)    MCHC 26.8 (*)    RDW 18.2 (*)    Platelets 623 (*)    nRBC 2.6 (*)    Neutro Abs 16.0 (*)    Lymphs Abs 4.3 (*)    Monocytes Absolute 2.2 (*)    Abs Immature Granulocytes 1.40 (*)    All other components within normal limits  URINALYSIS, ROUTINE W REFLEX MICROSCOPIC - Abnormal; Notable for the following components:   APPearance CLOUDY (*)    Hgb urine dipstick MODERATE (*)    Bacteria, UA RARE (*)    All other components within normal limits  COMPREHENSIVE METABOLIC PANEL - Abnormal;  Notable for the following components:   Potassium 6.5 (*)    CO2 9 (*)    Glucose, Bld 314 (*)    Creatinine, Ser 3.31 (*)    Calcium 8.7 (*)    AST 931 (*)    ALT 971 (*)    GFR calc non Af Amer 17 (*)    GFR calc Af Amer 20 (*)    Anion gap 31 (*)    All other components within normal limits  PROTIME-INR - Abnormal; Notable for the following components:   Prothrombin Time 24.6 (*)    INR 2.3 (*)    All other components within normal limits  APTT - Abnormal; Notable for the following components:   aPTT 56 (*)    All other components within normal limits  RAPID URINE DRUG SCREEN, HOSP PERFORMED - Abnormal; Notable for the following components:   Opiates POSITIVE (*)    All other components within normal limits  SALICYLATE LEVEL - Abnormal; Notable for the following components:   Salicylate Lvl <3.8 (*)    All other components within normal limits  ACETAMINOPHEN LEVEL - Abnormal; Notable for the following components:   Acetaminophen (Tylenol), Serum  89 (*)    All other components within normal limits  LACTIC ACID, PLASMA - Abnormal; Notable for the following components:   Lactic Acid, Venous >11.0 (*)    All other components within normal limits  LACTIC ACID, PLASMA - Abnormal; Notable for the following components:   Lactic Acid, Venous 9.4 (*)    All other components within normal limits  TRIGLYCERIDES - Abnormal; Notable for the following components:   Triglycerides 173 (*)    All other components within normal limits  AMMONIA - Abnormal; Notable for the following components:   Ammonia 148 (*)    All other components within normal limits  PROTIME-INR - Abnormal; Notable for the following components:   Prothrombin Time 21.7 (*)    INR 2.0 (*)    All other components within normal limits  CBC - Abnormal; Notable for the following components:   WBC 20.7 (*)    RBC 3.03 (*)    Hemoglobin 10.3 (*)    HCT 33.4 (*)    MCV 110.2 (*)    RDW 20.8 (*)    nRBC 4.5 (*)    All  other components within normal limits  MAGNESIUM - Abnormal; Notable for the following components:   Magnesium 2.6 (*)    All other components within normal limits  PHOSPHORUS - Abnormal; Notable for the following components:   Phosphorus 8.0 (*)    All other components within normal limits  COMPREHENSIVE METABOLIC PANEL - Abnormal; Notable for the following components:   Potassium 7.0 (*)    CO2 16 (*)    Glucose, Bld 175 (*)    Creatinine, Ser 3.21 (*)    Calcium 7.5 (*)    Total Protein 5.8 (*)    Albumin 2.8 (*)    AST 1,550 (*)    ALT 1,234 (*)    GFR calc non Af Amer 18 (*)    GFR calc Af Amer 21 (*)    Anion gap 17 (*)    All other components within normal limits  CBC - Abnormal; Notable for the following components:   WBC 27.8 (*)    RBC 3.09 (*)    Hemoglobin 10.7 (*)    HCT 36.1 (*)    MCV 116.8 (*)    MCH 34.6 (*)    MCHC 29.6 (*)    RDW 20.8 (*)    Platelets 451 (*)    nRBC 2.5 (*)    All other components within normal limits  ACETAMINOPHEN LEVEL - Abnormal; Notable for the following components:   Acetaminophen (Tylenol), Serum 58 (*)    All other components within normal limits  GLUCOSE, CAPILLARY - Abnormal; Notable for the following components:   Glucose-Capillary 135 (*)    All other components within normal limits  BASIC METABOLIC PANEL - Abnormal; Notable for the following components:   Potassium 5.9 (*)    CO2 13 (*)    Glucose, Bld 141 (*)    BUN 28 (*)    Creatinine, Ser 3.42 (*)    Calcium 6.7 (*)    GFR calc non Af Amer 17 (*)    GFR calc Af Amer 19 (*)    Anion gap 19 (*)    All other components within normal limits  BASIC METABOLIC PANEL - Abnormal; Notable for the following components:   Potassium 7.2 (*)    CO2 14 (*)    Glucose, Bld 146 (*)    BUN 34 (*)    Creatinine, Ser 4.24 (*)  Calcium 6.5 (*)    GFR calc non Af Amer 13 (*)    GFR calc Af Amer 15 (*)    Anion gap 22 (*)    All other components within normal limits    PROTIME-INR - Abnormal; Notable for the following components:   Prothrombin Time 25.0 (*)    INR 2.4 (*)    All other components within normal limits  AMMONIA - Abnormal; Notable for the following components:   Ammonia 71 (*)    All other components within normal limits  OSMOLALITY - Abnormal; Notable for the following components:   Osmolality 308 (*)    All other components within normal limits  GLUCOSE, CAPILLARY - Abnormal; Notable for the following components:   Glucose-Capillary 122 (*)    All other components within normal limits  GLUCOSE, CAPILLARY - Abnormal; Notable for the following components:   Glucose-Capillary 111 (*)    All other components within normal limits  CBG MONITORING, ED - Abnormal; Notable for the following components:   Glucose-Capillary 247 (*)    All other components within normal limits  CBG MONITORING, ED - Abnormal; Notable for the following components:   Glucose-Capillary 169 (*)    All other components within normal limits  I-STAT ARTERIAL BLOOD GAS, ED - Abnormal; Notable for the following components:   pH, Arterial 7.028 (*)    pCO2 arterial 57.8 (*)    pO2, Arterial 319 (*)    Bicarbonate 15.7 (*)    TCO2 18 (*)    Acid-base deficit 16.0 (*)    Potassium 6.0 (*)    Calcium, Ion 1.05 (*)    HCT 33.0 (*)    Hemoglobin 11.2 (*)    All other components within normal limits  POCT I-STAT 7, (LYTES, BLD GAS, ICA,H+H) - Abnormal; Notable for the following components:   pH, Arterial 7.105 (*)    pO2, Arterial 72 (*)    Bicarbonate 14.3 (*)    TCO2 16 (*)    Acid-base deficit 15.0 (*)    Potassium 5.9 (*)    Calcium, Ion 0.95 (*)    HCT 33.0 (*)    Hemoglobin 11.2 (*)    All other components within normal limits  POCT I-STAT 7, (LYTES, BLD GAS, ICA,H+H) - Abnormal; Notable for the following components:   pH, Arterial 7.117 (*)    Bicarbonate 14.8 (*)    TCO2 16 (*)    Acid-base deficit 14.0 (*)    Potassium 7.2 (*)    Calcium, Ion 0.87  (*)    HCT 34.0 (*)    Hemoglobin 11.6 (*)    All other components within normal limits  POCT I-STAT 7, (LYTES, BLD GAS, ICA,H+H) - Abnormal; Notable for the following components:   pH, Arterial 7.124 (*)    Bicarbonate 13.2 (*)    TCO2 14 (*)    Acid-base deficit 15.0 (*)    Potassium 6.8 (*)    Calcium, Ion 0.89 (*)    HCT 32.0 (*)    Hemoglobin 10.9 (*)    All other components within normal limits  TROPONIN I (HIGH SENSITIVITY) - Abnormal; Notable for the following components:   Troponin I (High Sensitivity) 2,400 (*)    All other components within normal limits  TROPONIN I (HIGH SENSITIVITY) - Abnormal; Notable for the following components:   Troponin I (High Sensitivity) 3,303 (*)    All other components within normal limits  TROPONIN I (HIGH SENSITIVITY) - Abnormal; Notable for the following components:  Troponin I (High Sensitivity) 2,681 (*)    All other components within normal limits  TROPONIN I (HIGH SENSITIVITY) - Abnormal; Notable for the following components:   Troponin I (High Sensitivity) 3,300 (*)    All other components within normal limits  SARS CORONAVIRUS 2 BY RT PCR (HOSPITAL ORDER, [place] [facility])  MRSA PCR SCREENING  CULTURE, BLOOD (ROUTINE X 2)  CULTURE, BLOOD (ROUTINE X 2)  URINE CULTURE  CULTURE, RESPIRATORY  ACETAMINOPHEN LEVEL  BASIC METABOLIC PANEL  BLOOD GAS, ARTERIAL  BLOOD GAS, ARTERIAL  HEPATITIS PANEL, ACUTE  PATHOLOGIST SMEAR REVIEW  HEPATIC FUNCTION PANEL  PROTIME-INR  BLOOD GAS, ARTERIAL  I-STAT CHEM 8, ED  TYPE AND SCREEN  PREPARE RBC (CROSSMATCH)    EKG EKG Interpretation  Date/Time:  Friday October 20 2019 16:28:44 EDT Ventricular Rate:  45 PR Interval:    QRS Duration: 126 QT Interval:  470 QTC Calculation: 407 R Axis:   64 Text Interpretation: first degree AV block Nonspecific intraventricular conduction delay Confirmed by Wandra Arthurs [phone]) on 11/06/2019 4:43:54 PM   Radiology CT ABDOMEN  PELVIS WO CONTRAST  Result Date: 11/04/2019 CLINICAL DATA:  Abdominal pain EXAM: CT ABDOMEN AND PELVIS WITHOUT CONTRAST TECHNIQUE: Multidetector CT imaging of the abdomen and pelvis was performed following the standard protocol without IV contrast. COMPARISON:  None. FINDINGS: Cardiovascular: There are no significant vascular findings, however limited due to lack of intravenous contrast. Coronary artery calcifications are seen. Scattered aortic atherosclerosis is noted. There is mild cardiomegaly. There is no pericardial thickening or effusion. Mediastinum/Nodes: There are no enlarged mediastinal, hilar or axillary lymph nodes.ETT is seen 2.5 cm above the carina. The thyroid gland, trachea and esophagus demonstrate no significant findings. Lungs/Pleura: Patchy tree-in-bud opacities are seen throughout the anterior right upper lobe, right middle lobe and right lung base with several areas air bronchograms. There is also streaky airspace opacity seen at both lung bases. Upper abdomen: The visualized portion of the upper abdomen is unremarkable. Musculoskeletal/Chest wall: There is no chest wall mass or suspicious osseous finding. Again noted are anterior flowing osteophytes seen throughout the thoracic spine. There is a superior compression deformity of T8 vertebral body with less than 25% loss in height as prior exam. Abdomen/pelvis: Hepatobiliary: Although limited due to the lack of intravenous contrast, normal in appearance without gross focal abnormality. No evidence of calcified gallstones or biliary ductal dilatation. Pancreas:  Unremarkable.  No surrounding inflammatory changes. Spleen: Normal in size. Although limited due to the lack of intravenous contrast, normal in appearance. Adrenals/Urinary Tract: Both adrenal glands appear normal. There is a 3.9 cm low-density lesion seen in the lower pole the right kidney. Bladder is unremarkable. Stomach/Bowel: NG tube is seen within the proximal stomach. The  stomach, small bowel, and colon are normal in appearance. No inflammatory changes or obstructive findings. Scattered colonic diverticula are seen without diverticulitis. There is a moderate amount stool present. Vascular/Lymphatic: There are no enlarged abdominal or pelvic lymph nodes. Scattered aortic atherosclerotic calcifications are seen without aneurysmal dilatation. Reproductive: The prostate is unremarkable. Other: No evidence of abdominal wall mass or hernia. Musculoskeletal: No acute or significant osseous findings. The patient is status post decompression from L2 through L5 with posterior spinal fixation hardware. There is anterior fixation hardware at L4-L5. Anterior flowing osteophytes are seen. IMPRESSION: Multifocal tree-in-bud and patchy airspace opacities seen throughout the right lung which could be due to infectious or inflammatory process. No renal or collecting system calculi Diverticulosis without diverticulitis Moderate  amount of colonic stool Aortic Atherosclerosis (ICD10-I70.0). Electronically Signed   By: Prudencio Pair M.D.   On: 11/04/2019 18:00   DG Chest 1 View  Result Date: 11/05/2019 CLINICAL DATA:  74 year old male with intubation. EXAM: CHEST  1 VIEW COMPARISON:  Chest radiograph dated 03/31/2019. FINDINGS: Endotracheal tube with tip approximately 3.5 cm above the carina. Enteric tube extends below the diaphragm with tip beyond the inferior margin of the image. Faint bilateral mid to lower lung field subpleural densities may represent atelectatic changes or atypical infection. Clinical correlation recommended. No lobar consolidation, pleural effusion, or pneumothorax. The cardiac silhouette is within limits. Atherosclerotic calcification of the aortic arch. No acute osseous pathology. IMPRESSION: 1. Endotracheal tube above the carina. 2. Faint bilateral mid to lower lung field subpleural densities may represent atelectasis or atypical infection. Electronically Signed   By: Anner Crete M.D.   On: 10/08/2019 16:55   CT HEAD WO CONTRAST  Result Date: 10/18/2019 CLINICAL DATA:  74 year old male with altered mental status. EXAM: CT HEAD WITHOUT CONTRAST TECHNIQUE: Contiguous axial images were obtained from the base of the skull through the vertex without intravenous contrast. COMPARISON:  None. FINDINGS: Brain: Mild age-related atrophy and chronic microvascular ischemic changes. There is no acute intracranial hemorrhage. No mass effect or midline shift. No extra-axial fluid collection. Vascular: No hyperdense vessel or unexpected calcification. Skull: Normal. Negative for fracture or focal lesion. Sinuses/Orbits: There is mild mucoperiosteal thickening of paranasal sinuses. No air-fluid level. The mastoid air cells are clear. Other: Partially visualized endotracheal and enteric tubes with associated streak artifact. IMPRESSION: 1. No acute intracranial pathology. 2. Mild age-related atrophy and chronic microvascular ischemic changes. Electronically Signed   By: Anner Crete M.D.   On: 11/03/2019 17:52   CT Chest Wo Contrast  Result Date: 10/15/2019 CLINICAL DATA:  Abdominal pain EXAM: CT ABDOMEN AND PELVIS WITHOUT CONTRAST TECHNIQUE: Multidetector CT imaging of the abdomen and pelvis was performed following the standard protocol without IV contrast. COMPARISON:  None. FINDINGS: Cardiovascular: There are no significant vascular findings, however limited due to lack of intravenous contrast. Coronary artery calcifications are seen. Scattered aortic atherosclerosis is noted. There is mild cardiomegaly. There is no pericardial thickening or effusion. Mediastinum/Nodes: There are no enlarged mediastinal, hilar or axillary lymph nodes.ETT is seen 2.5 cm above the carina. The thyroid gland, trachea and esophagus demonstrate no significant findings. Lungs/Pleura: Patchy tree-in-bud opacities are seen throughout the anterior right upper lobe, right middle lobe and right lung base with  several areas air bronchograms. There is also streaky airspace opacity seen at both lung bases. Upper abdomen: The visualized portion of the upper abdomen is unremarkable. Musculoskeletal/Chest wall: There is no chest wall mass or suspicious osseous finding. Again noted are anterior flowing osteophytes seen throughout the thoracic spine. There is a superior compression deformity of T8 vertebral body with less than 25% loss in height as prior exam. Abdomen/pelvis: Hepatobiliary: Although limited due to the lack of intravenous contrast, normal in appearance without gross focal abnormality. No evidence of calcified gallstones or biliary ductal dilatation. Pancreas:  Unremarkable.  No surrounding inflammatory changes. Spleen: Normal in size. Although limited due to the lack of intravenous contrast, normal in appearance. Adrenals/Urinary Tract: Both adrenal glands appear normal. There is a 3.9 cm low-density lesion seen in the lower pole the right kidney. Bladder is unremarkable. Stomach/Bowel: NG tube is seen within the proximal stomach. The stomach, small bowel, and colon are normal in appearance. No inflammatory changes or obstructive findings. Scattered colonic  diverticula are seen without diverticulitis. There is a moderate amount stool present. Vascular/Lymphatic: There are no enlarged abdominal or pelvic lymph nodes. Scattered aortic atherosclerotic calcifications are seen without aneurysmal dilatation. Reproductive: The prostate is unremarkable. Other: No evidence of abdominal wall mass or hernia. Musculoskeletal: No acute or significant osseous findings. The patient is status post decompression from L2 through L5 with posterior spinal fixation hardware. There is anterior fixation hardware at L4-L5. Anterior flowing osteophytes are seen. IMPRESSION: Multifocal tree-in-bud and patchy airspace opacities seen throughout the right lung which could be due to infectious or inflammatory process. No renal or collecting  system calculi Diverticulosis without diverticulitis Moderate amount of colonic stool Aortic Atherosclerosis (ICD10-I70.0). Electronically Signed   By: Prudencio Pair M.D.   On: 10/10/2019 18:00   DG Chest Port 1 View  Result Date: 10/12/2019 CLINICAL DATA:  74 year old male with central line placement. EXAM: PORTABLE CHEST 1 VIEW COMPARISON:  Chest radiograph dated 10/12/2019. FINDINGS: Endotracheal tube approximately 5 cm above the carina and enteric tube extending below the diaphragm with tip beyond the inferior margin of the image. Interval placement of left IJ central venous line with tip over central SVC. No pneumothorax. Bilateral mid to lower lung field streaky densities, progressed since the earlier radiograph. At area of airspace opacity at the right lung base, new since the prior radiograph and may represent atelectasis or infiltrate. Aspiration is not excluded. Clinical correlation recommended. Stable cardiac silhouette. No acute osseous pathology. IMPRESSION: 1. Interval placement of left IJ central venous line with tip over central SVC. No pneumothorax. 2. Bilateral mid to lower lung field streaky densities, progressed since the earlier radiograph. Electronically Signed   By: Anner Crete M.D.   On: 10/25/2019 23:35   ECHOCARDIOGRAM COMPLETE BUBBLE STUDY  Result Date: 10/21/2019    ECHOCARDIOGRAM REPORT   Patient Name:   LADARREN STEINER Date of Exam: 10/21/2019 Medical Rec #:  [id number]       Height:       74.0 in Accession #:    [id number]      Weight:       272.1 lb Date of Birth:  05-15-1945       BSA:          2.477 m Patient Age:    79 years        BP:           135/69 mmHg Patient Gender: M               HR:           92 bpm. Exam Location:  Inpatient Procedure: 2D Echo STAT ECHO Indications:    acute coronary syndrome w/ elevated troponin  History:        Patient has prior history of Echocardiogram examinations, most                 recent 08/19/2018. CAD, COPD; Risk Factors:Diabetes,  Hypertension                 and Former Smoker. PAF. PE.  Sonographer:    Jannett Celestine RDCS (AE) Referring Phys: [id number] Frederik Pear  Sonographer Comments: No parasternal window, no subcostal window and echo performed with patient supine and on artificial respirator. Image acquisition challenging due to patient body habitus and Image acquisition challenging due to respiratory motion. IMPRESSIONS  1. Peak LVOT gradient 48 mmHg.[name] Left ventricular ejection fraction, by estimation, is 60 to 65%. The left ventricle has normal function. The  left ventricle has no regional wall motion abnormalities. There is not well visualized left ventricular hypertrophy. Left ventricular diastolic parameters were normal.  2. Right ventricular systolic function is normal. The right ventricular size is not well visualized.  3. The mitral valve was not well visualized. Trivial mitral valve regurgitation. No evidence of mitral stenosis.  4. The aortic valve was not well visualized. Aortic valve regurgitation is not visualized. No aortic stenosis is present.  5. Pulmonic valve regurgitation unable to be assessed. Conclusion(s)/Recommendation(s): Technically difficult study given limited windows, even with use of echo contrast. Normal LVEF, no focal wall motion abnormalities appreciated. 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 FINDINGS  Left Ventricle: Peak LVOT gradient 48 mmHg. Left ventricular ejection fraction, by estimation, is 60 to 65%. The left ventricle has normal function. The left ventricle has no regional wall motion abnormalities. Definity contrast agent was given IV to delineate the left ventricular endocardial borders. The left ventricular internal cavity size was normal in size. There is not well visualized left ventricular hypertrophy. Left ventricular diastolic parameters were normal. Right Ventricle: The right ventricular size is not well visualized. Right vetricular wall thickness was not assessed. Right  ventricular systolic function is normal. Left Atrium: Left atrial size was normal in size. Right Atrium: Right atrial size was normal in size. Pericardium: The pericardium was not well visualized. Mitral Valve: The mitral valve was not well visualized. Trivial mitral valve regurgitation. No evidence of mitral valve stenosis. Tricuspid Valve: The tricuspid valve is not well visualized. Tricuspid valve regurgitation is trivial. No evidence of tricuspid stenosis. Aortic Valve: The aortic valve was not well visualized. Aortic valve regurgitation is not visualized. No aortic stenosis is present. Pulmonic Valve: The pulmonic valve was not well visualized. Pulmonic valve regurgitation unable to be assessed. Aorta: The aortic root was not well visualized and the ascending aorta was not well visualized. Venous: The inferior vena cava was not well visualized. IVC assessment for right atrial pressure unable to be performed due to mechanical ventilation. IAS/Shunts: The interatrial septum was not well visualized.   Diastology LV e' lateral:   13.50 cm/s LV E/e' lateral: 7.9 LV e' medial:    8.59 cm/s LV E/e' medial:  12.3  RIGHT VENTRICLE TAPSE (M-mode): 2.4 cm LEFT ATRIUM             Index       RIGHT ATRIUM           Index LA Vol (A2C):   62.1 ml 25.07 ml/m RA Area:     14.90 cm LA Vol (A4C):   63.1 ml 25.47 ml/m RA Volume:   35.50 ml  14.33 ml/m LA Biplane Vol: 64.3 ml 25.96 ml/m  MITRAL VALVE MV Area (PHT): 2.42 cm MV Decel Time: 313 msec MV E velocity: 106.00 cm/s MV A velocity: 82.90 cm/s MV E/A ratio:  1.28 [name] Dresser MD Electronically signed by [name] Dresser MD Signature Date/Time: 10/21/2019/9:30:11 AM    Final     Procedures Procedure Name: Intubation Date/Time: 10/10/2019 4:35 PM Performed by: Silvestre Gunner, MD Pre-anesthesia Checklist: Patient identified, Emergency Drugs available, Suction available, Patient being monitored and Timeout performed Oxygen Delivery Method: Ambu  bag Preoxygenation: Pre-oxygenation with 100% oxygen Ventilation: Mask ventilation without difficulty Laryngoscope Size: Glidescope and 3 Grade View: Grade I Tube size: 7.5 mm Number of attempts: 1 Airway Equipment and Method: Rigid stylet and Video-laryngoscopy Placement Confirmation: ETT inserted through vocal cords under direct vision,  Positive ETCO2,  CO2 detector  and Breath sounds checked- equal and bilateral Secured at: 25 cm Tube secured with: ETT holder Dental Injury: Teeth and Oropharynx as per pre-operative assessment  Difficulty Due To: Difficulty was unanticipated      (including critical care time)  Medications Ordered in ED Medications  0.9 %  sodium chloride infusion (has no administration in time range)  norepinephrine (LEVOPHED) 27m in 2545mpremix infusion (17 mcg/min Intravenous Rate/Dose Verify 10/21/19 0800)  acetylcysteine (ACETADOTE) 40 mg/mL load via infusion 14,805 mg (14,805 mg Intravenous Bolus from Bag 10/19/2019 1948)    Followed by  acetylcysteine (ACETADOTE) 24,000 mg in dextrose 5 % 600 mL (40 mg/mL) infusion (15 mg/kg/hr  98.7 kg (Adjusted) Intravenous Rate/Dose Verify 10/21/19 0800)  lactated ringers infusion ( Intravenous Rate/Dose Verify 10/21/19 0800)  docusate (COLACE) 50 MG/5ML liquid 100 mg (has no administration in time range)  polyethylene glycol (MIRALAX / GLYCOLAX) packet 17 g (has no administration in time range)  pantoprazole (PROTONIX) injection 40 mg (has no administration in time range)  docusate sodium (COLACE) capsule 100 mg (has no administration in time range)  polyethylene glycol (MIRALAX / GLYCOLAX) packet 17 g (has no administration in time range)  fentaNYL (SUBLIMAZE) 100 MCG/2ML injection (has no administration in time range)  insulin aspart (novoLOG) injection 5 Units (has no administration in time range)  0.9 %  sodium chloride infusion (has no administration in time range)  insulin aspart (novoLOG) injection 0-15 Units (0  Units Subcutaneous Not Given 10/21/19 0820)  Chlorhexidine Gluconate Cloth 2 % PADS 6 each (has no administration in time range)  chlorhexidine gluconate (MEDLINE KIT) (PERIDEX) 0.12 % solution 15 mL (15 mLs Mouth Rinse Given 10/21/19 0800)  MEDLINE mouth rinse (15 mLs Mouth Rinse Given 10/21/19 1039)  ipratropium-albuterol (DUONEB) 0.5-2.5 (3) MG/3ML nebulizer solution 3 mL (3 mLs Nebulization Given 10/21/19 0752)  albuterol (PROVENTIL) (2.5 MG/3ML) 0.083% nebulizer solution 2.5 mg (has no administration in time range)  0.9 %  sodium chloride infusion ( Intravenous Rate/Dose Verify 10/21/19 0800)  vasopressin (PITRESSIN) 20 Units in sodium chloride 0.9 % 100 mL infusion-*FOR SHOCK* (0.03 Units/min Intravenous Rate/Dose Verify 10/21/19 0800)  dexmedetomidine (PRECEDEX) 400 MCG/100ML (4 mcg/mL) infusion (has no administration in time range)  ceFEPIme (MAXIPIME) 2 g in sodium chloride 0.9 % 100 mL IVPB (has no administration in time range)  sodium bicarbonate 150 mEq in dextrose 5% 1000 mL infusion (150 mEq Intravenous New Bag/Given 10/21/19 1038)  sodium zirconium cyclosilicate (LOKELMA) packet 10 g (has no administration in time range)  pantoprazole (PROTONIX) 80 mg in sodium chloride 0.9 % 100 mL IVPB ( Intravenous Stopped 10/21/19 0008)  sodium bicarbonate injection 50 mEq (50 mEq Intravenous Given 10/15/2019 1842)  lactated ringers bolus 1,000 mL (1,000 mLs Intravenous New Bag/Given 10/25/2019 1842)  ceFEPIme (MAXIPIME) 2 g in sodium chloride 0.9 % 100 mL IVPB (0 g Intravenous Stopped 10/23/2019 1938)  vancomycin (VANCOREADY) IVPB 2000 mg/400 mL ( Intravenous Stopped 10/13/2019 2155)  insulin aspart (novoLOG) injection 5 Units (5 Units Subcutaneous Given 10/26/2019 1918)  sodium zirconium cyclosilicate (LOKELMA) packet 10 g (10 g Per Tube Given 10/28/2019 1916)  calcium gluconate 1 g/ 50 mL sodium chloride IVPB (0 g Intravenous Stopped 11/07/2019 2006)  sodium bicarbonate injection 50 mEq (50 mEq Intravenous Given 10/26/2019  2043)  lactulose (CHRONULAC) 10 GM/15ML solution 30 g (30 g Per Tube Given 10/21/19 0037)  fomepizole (ANTIZOL) 1,850 mg in sodium chloride 0.9 % 100 mL IVPB ( Intravenous Stopped 10/21/19 0158)  albumin human 5 % solution  25 g ( Intravenous Rate/Dose Verify 10/21/19 0800)  sodium bicarbonate injection 100 mEq (100 mEq Intravenous Given 10/21/19 0554)  calcium gluconate 2 g/ 100 mL sodium chloride IVPB ( Intravenous Stopped 10/21/19 0746)  aspirin tablet 325 mg (325 mg Per Tube Given 10/21/19 0654)  PERFLUTREN LIPID MICROSPHERE injection SUSP (  Given by Other 10/21/19 0845)    ED Course  I have reviewed the triage vital signs and the nursing notes.  Pertinent labs & imaging results that were available during my care of the patient were reviewed by me and considered in my medical decision making (see chart for details).    MDM Rules/Calculators/A&P                          74 year old male with history of AAA, coronary artery disease, A. fib on warfarin presents unresponsive.  Patient's wife states that patient was tired this morning in bed however when she checked on him back he was unresponsive.  Upon EMS arrival patient was unresponsive and after initial EMS assessment patient was bradycardic into the 40s.  Patient was then being paced by EMS and EMS started bagging.  Glucose and blood pressure at that time were okay.  Upon arrival to the emerge department patient continued to be nonresponsive.  Patient was transferred over to the bed from EMS stretcher and was intubated using glide scope and a 7.5 ET tube which was placed 25 cm at the lips.  Patient's heart rate 40s to 50s spontaneously not being paced.  Initial blood pressure 60s/40s so patient was started on levo drip.  Physical exam did not show any acute abnormalities that may explain patient's unresponsiveness.  Bedside ultrasound showed a AAA with no evidence of acute rupture as well as four-chamber view of the heart which did not show any  wall motion abnormalities or septal bowing.  Unclear etiology to patient's presentation at this time.  Wide differential including CVA, intracranial hemorrhage, AAA rupture, GI bleed, acute cardiac event.  Given what appears to be dark output of the patient's NG tube we will treat empirically with a unit of blood for possible GI bleed as source of patient's altered mental status.  Will obtain labs and imaging and continually reassess the patient.  Blood count 23.9, hemoglobin 10.3, lactate greater than 11.  Acetaminophen level 89.  Potassium elevated to 6.5.  Will, as well as temporizing measures were started given elevated potassium.  No evidence of ischemia on EKG.  Elevated AST ALT likely secondary to shock liver.  CT abdomen and head unremarkable.  CT chest showed multifocal airspace disease throughout the right lung consistent with an infectious process.  Covid negative.  Unclear etiology of patient's altered mental status no sepsis may eventually play a role.  We will treat patient with vancomycin as well as cefepime and start fluids.  Also potentially a component of overdose given elevated.  Given critically ill nature patient critical care was consulted for admission.  Critical care evaluated the patient at bedside and agreed with admission to their service.  Family members were updated at the bedside regarding patient's plan of care and course.  Patient was admitted to the critical care service in stable condition. Final Clinical Impression(s) / ED Diagnoses Final diagnoses:  Acute respiratory failure with hypoxia (HCC)  High anion gap metabolic acidosis    Rx / DC Orders ED Discharge Orders    None       Silvestre Gunner, MD  10/21/19 1052    Drenda Freeze, MD 10/24/19 [phone]

## 2019-10-20 NOTE — Procedures (Signed)
Central Venous Catheter Insertion Procedure Note  JAKEEL STARLIPER  [id number]  10-23-1945  Date:10/16/2019  Time:11:08 PM   Provider Performing:Vinita Prentiss   Procedure: Insertion of Non-tunneled Central Venous [id number]) with US guidance (47076)   Indication(s) Medication administration  Consent Risks of the procedure as well as the alternatives and risks of each were explained to the patient and/or caregiver.  Consent for the procedure was obtained and is signed in the bedside chart  Anesthesia Topical only with 1% lidocaine   Timeout Verified patient identification, verified procedure, site/side was marked, verified correct patient position, special equipment/implants available, medications/allergies/relevant history reviewed, required imaging and test results available.  Sterile Technique Maximal sterile technique including full sterile barrier drape, hand hygiene, sterile gown, sterile gloves, mask, hair covering, sterile ultrasound probe cover (if used).  Procedure Description Area of catheter insertion was cleaned with chlorhexidine and draped in sterile fashion.  With real-time ultrasound guidance a central venous catheter was placed into the left internal jugular vein. Nonpulsatile blood flow and easy flushing noted in all ports.  The catheter was sutured in place and sterile dressing applied.  Complications/Tolerance None; patient tolerated the procedure well. Chest X-ray is ordered to verify placement for internal jugular or subclavian cannulation.   Chest x-ray is not ordered for femoral cannulation.  EBL Minimal  Specimen(s) None

## 2019-10-20 NOTE — ED Notes (Signed)
Patient was source for blood exposure to staff member.  Per hospital policy blood was drawn for exposure panel. 

## 2019-10-20 NOTE — ED Notes (Signed)
Pt in CT at this time, 1st unit PRBC completed.

## 2019-10-20 NOTE — ED Notes (Signed)
Pt suddenly opened eyes, made very purposeful movements with both hands attempting to grab ETT, pt did look at me when directed.  Pt attempting to push ETT and OG out with tongue. CCM notified. EDP gave order for Fentanyl

## 2019-10-20 NOTE — Procedures (Signed)
Arterial Catheter Insertion Procedure Note  CORTLAND CREHAN  [id number]  October 26, 1945  Date:10/08/2019  Time:11:27 PM    Provider Performing: Jorje Guild    Procedure: Insertion of Arterial Line [phone]) with US guidance (73220)   Indication(s) Blood pressure monitoring and/or need for frequent ABGs  Consent Risks of the procedure as well as the alternatives and risks of each were explained to the patient and/or caregiver.  Consent for the procedure was obtained and is signed in the bedside chart  Anesthesia None   Time Out Verified patient identification, verified procedure, site/side was marked, verified correct patient position, special equipment/implants available, medications/allergies/relevant history reviewed, required imaging and test results available.   Sterile Technique Maximal sterile technique including full sterile barrier drape, hand hygiene, sterile gown, sterile gloves, mask, hair covering, sterile ultrasound probe cover (if used).   Procedure Description Area of catheter insertion was cleaned with chlorhexidine and draped in sterile fashion. With real-time ultrasound guidance an arterial catheter was placed into the left radial artery.  Appropriate arterial tracings confirmed on monitor.     Complications/Tolerance None; patient tolerated the procedure well.   EBL Minimal   Specimen(s) None

## 2019-10-20 NOTE — H&P (Signed)
NAME:  [name] [name], MRN:  [id number], DOB:  1945/06/28, LOS: 0 ADMISSION DATE:  10/21/2019, CONSULTATION DATE:  10/19/2019 REFERRING MD:  EDP, CHIEF COMPLAINT:  AMS, respiratory insufficiency   Brief History   74 y.o. M with PMH of CAD s/p PCI/DES to LAD in 2015, AAA, a-fib on coumadin, HTN, OSA and COPD sees Dr. Halford Chessman, DM and hx DVT/PE who was found unresponsive in bed by his wife.  EMS found patient bradycardic with HR in  40's.   He was intubated for GCS of 3.  Found to have AKI with hyperkalemia, elevated Tylenol level and transaminases.  History of present illness   [name] [name] is a 74 y.o. M with PMH of  CAD s/p PCI/DES to LAD in 2015, AAA, a-fib on coumadin, HTN, OSA and COPD sees Dr. Halford Chessman, DM and hx DVT/PE who was brought in unresponsive and bradyacardic.  Pt's wife reports that he has been having a lot of back pain and taking Tylenol and prescribed oxycodone.  He seemed more tired today, but otherwise no new complaints.  He never got out of bed this morning, she checked on him multiple times and he reported being tired, but around 2pm she found him unresponsive and called EMS.  He was initially bradycardic in the 40's with GCS of 3.  He was bagged and externally paced, then intubated in the ED. Requiring Levophed. Narcan was reportedly administered without response  Head CT negative, CT chest/abdomen/pelvis significant for multi-focal patchy airspace opacities.  Labs with initial K of 6.5, creatinine 3.3, elevated LFT's, lactic acid >11 and WBC 22k.  Tylenol level also returned elevated at 89.  Ph 7.0, pCO2 57, pO2 319, Bicarb 15.  He was given an amp of Bicarb, 5 units insulin, Kayexalate, Vancomycin, Cefepime and 3L IVF and PCCM consulted for admission.  Pt did have an episode of wakefulness with pulling at ETT, he was placed on propofol.  Past Medical History   has a past medical history of Arthritis, Asthma, BPH (benign prostatic hypertrophy), CAD (coronary artery disease), Complication  of anesthesia, COPD (chronic obstructive pulmonary disease) ([place]), Diabetes mellitus without complication ([place]), Diverticulosis, DVT (deep venous thrombosis) ([place]), Dyspnea on exertion, GERD (gastroesophageal reflux disease), History of gout, History of hiatal hernia, echocardiogram (2015), Hypertension, Macrocytic anemia, Obesity, OSA (obstructive sleep apnea) (11/14/2015), PAF (paroxysmal atrial fibrillation) ([place]), Pneumonia (~ 2010 X 1), Pulmonary embolism ([place]), Sleep apnea, Spinal stenosis, Transaminitis, and 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   Significant Hospital Events   8/13 Admit to PCCM  Consults:  Nephrology  Procedures:  8/13 ETT 8/13 L IJ CVC 8/13 L radial A-line  Significant Diagnostic Tests:  8/13 CT head/abd/pelvis>>Multifocal tree-in-bud and patchy airspace opacities seen throughout the right lung which could be due to infectious or inflammatory process.   Micro Data:  8/13 BCx2>> 8/13 Respiratory culture>> 8/13 Sars-CoV-2>>neg  Antimicrobials:  Vancomycin 8/13- Cefepime 8/13-  Interim history/subjective:  As above  Objective   Blood pressure (!) 98/50, pulse 60, temperature (!) 94.8 F (34.9 C), resp. rate (!) 23, height 6\' 2"  (1.88 m), weight 123.4 kg, SpO2 98 %.    Vent Mode: PRVC FiO2 (%):  [50 %-100 %] 50 % Set Rate:  [18 bmp-26 bmp] 26 bmp Vt Set:  [650 mL] 650 mL PEEP:  [5 cmH20] 5 cmH20 Plateau Pressure:  [21 cmH20] 21 cmH20  No intake or output data in the 24 hours ending 10/29/2019 2053 Filed Weights   11/06/2019 1818  Weight: 123.4 kg  General:  Elderly, overweight M intubated and sedated HEENT: MM pink/moist, ETT in place Neuro: moving all extremities, sedated on propofol, grimacing to pain CV: s1s2 rrr, no m/r/g PULM:  Decreased in bilateral bases, no wheezing or rhonchi, on full vent support GI: soft, bsx4 active  Extremities: warm/dry, no edema  Skin: no rashes or lesions   Resolved Hospital Problem list     Assessment &  Plan:    Encephalopathy and respiratory insufficiency likely secondary to septic shock and multi-organ failure with elevated Tylenol level UDS positive for Benzo's only. Unclear whether he overdosed which contributed to his AMS and possible aspiration, perhaps may have been developing sepsis/PNA and Tylenol level minimally contributory -Received 30cc/kg IVF, Continue Vancomycin and Cefepime, follow blood and respiratory cultures -Continue Levophed to maintain MAP >65 -trend lactic acid -Spoke with poison control, recommended one dose of Fomepizole and continue mucomyst at least 24hrs and until Tylenol level is negative/LFT's are down-trending.  Pt would also meet criteria for transfer to transplant center, however given mixed picture and unstable clinical status continue to monitor  -Maintain full vent support with SAT/SBT as tolerated -titrate Vent setting to maintain SpO2 greater than or equal to 90%. -HOB elevated 30 degrees. -Plateau pressures less than 30 cm H20.  -Follow chest x-ray, ABG prn.   -Bronchial hygiene and RT/bronchodilator protocol. -Propofol, prn Fentanyl    Acute non-oliguric kidney injury and hyperkalemia Likely secondary to recent decreased po intake and hypotension -K improved to 5.9 with medical therapy, nephrology engaged and did not think patient required dialysis overnight -monitor UOP, q4hr BMP's   Elevated transaminases Likely shock liver, unclear how much Tylenol contributing -plan for Tylenol OD as above -Ammonia 148, one dose lactulose and repeat level in AM -check acute hepatitis panel  AGMA Likely secondary to renal failure and lactic acidosis, improving after IVF and 2 amps bicarb -trend BMP, lactic acid, repeat ABG may need bicarb gtt  CAD, HTN, Atrial fibrillation No evidence of ACS -trend troponin and obtain echo  -INR 2.0, holding coumadin right now, repeat INR in the AM -holding home anti-hypertensives  History of COPD Sees Dr. Halford Chessman,  2018 PFT ratio 63%, FEV1 2.81 L 76% predicted, FVC 4.48 L 89% predicted -scheduled and prn duonebs -nightly CPAP at home  Type 2 DM -hold Metformin, SSI      Best practice:  Diet: NPO Pain/Anxiety/Delirium protocol (if indicated): Propofol, Fentanyl VAP protocol (if indicated): HOB 30 degrees, suction prn DVT prophylaxis: SCD's GI prophylaxis: protonix Glucose control: SSI Mobility: bed rest Code Status: full code Family Communication: Wife updated at the bedside Disposition: ICU  Labs   CBC: Recent Labs  Lab 10/19/2019 1628 11/06/2019 1951  WBC 23.9*  --   NEUTROABS 16.0*  --   HGB 10.3* 11.2*  HCT 38.4* 33.0*  MCV 134.3*  --   PLT 623*  --     Basic Metabolic Panel: Recent Labs  Lab 10/10/2019 1628 10/21/2019 1951  NA 140 137  K 6.5* 6.0*  CL 100  --   CO2 9*  --   GLUCOSE 314*  --   BUN 21  --   CREATININE 3.31*  --   CALCIUM 8.7*  --    GFR: Estimated Creatinine Clearance: 26.9 mL/min (A) (by C-G formula based on SCr of 3.31 mg/dL (H)). Recent Labs  Lab 11/07/2019 1628 10/19/2019 1830  WBC 23.9*  --   LATICACIDVEN  --  >11.0*    Liver Function Tests: Recent Labs  Lab 11/07/2019  1628  AST 931*  ALT 971*  ALKPHOS 107  BILITOT 0.7  PROT 7.1  ALBUMIN 3.6   No results for input(s): LIPASE, AMYLASE in the last 168 hours. No results for input(s): AMMONIA in the last 168 hours.  ABG    Component Value Date/Time   PHART 7.028 (LL) 10/09/2019 1951   PCO2ART 57.8 (H) 10/12/2019 1951   PO2ART 319 (H) 11/05/2019 1951   HCO3 15.7 (L) 10/29/2019 1951   TCO2 18 (L) 10/23/2019 1951   ACIDBASEDEF 16.0 (H) 11/05/2019 1951   O2SAT 100.0 10/18/2019 1951     Coagulation Profile: Recent Labs  Lab 10/14/2019 1628  INR 2.3*    Cardiac Enzymes: No results for input(s): CKTOTAL, CKMB, CKMBINDEX, TROPONINI in the last 168 hours.  HbA1C: Hgb A1c MFr Bld  Date/Time Value Ref Range Status  10/02/2019 02:36 PM 5.7 (H) <5.7 % of total Hgb Final    Comment:     For someone without known diabetes, a hemoglobin  A1c value between 5.7% and 6.4% is consistent with prediabetes and should be confirmed with a  follow-up test. . For someone with known diabetes, a value <7% indicates that their diabetes is well controlled. A1c targets should be individualized based on duration of diabetes, age, comorbid conditions, and other considerations. . This assay result is consistent with an increased risk of diabetes. . Currently, no consensus exists regarding use of hemoglobin A1c for diagnosis of diabetes for children. [name]   03/22/2019 02:16 PM 6.1 4.6 - 6.5 % Final    Comment:    Glycemic Control Guidelines for People with Diabetes:Non Diabetic:  <6%Goal of Therapy: <7%Additional Action Suggested:  >8%     CBG: Recent Labs  Lab 10/25/2019 1628 10/26/2019 2008  GLUCAP 247* 169*    Review of Systems:   Unable to obtain secondary to AMS  Past Medical History  He,  has a past medical history of Arthritis, Asthma, BPH (benign prostatic hypertrophy), CAD (coronary artery disease), Complication of anesthesia, COPD (chronic obstructive pulmonary disease) ([place]), Diabetes mellitus without complication ([place]), Diverticulosis, DVT (deep venous thrombosis) ([place]), Dyspnea on exertion, GERD (gastroesophageal reflux disease), History of gout, History of hiatal hernia, echocardiogram (2015), Hypertension, Macrocytic anemia, Obesity, OSA (obstructive sleep apnea) (11/14/2015), PAF (paroxysmal atrial fibrillation) ([place]), Pneumonia (~ 2010 X 1), Pulmonary embolism ([place]), Sleep apnea, Spinal stenosis, Transaminitis, and 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   Surgical History    Past Surgical History:  Procedure Laterality Date  . ANTERIOR LUMBAR [place] ARTHROPLASTY  03/2008   "spacer poped out; had to repair"  . CARDIOVERSION N/A 04/02/2019   Procedure: CARDIOVERSION;  Surgeon: Deboraha Sprang, MD;  Location: [facility] OR;  Service: Cardiovascular;  Laterality: N/A;  . CATARACT  EXTRACTION W/ INTRAOCULAR LENS  IMPLANT, BILATERAL Bilateral   . COLONOSCOPY    . COLONOSCOPY W/ POLYPECTOMY  2004  . Colonoscopy with polypectomy  09/2011   2 tubular adenomas  . CORONARY ANGIOPLASTY WITH STENT PLACEMENT  08/08/2013   "1"  . JOINT REPLACEMENT    . LEFT AND RIGHT HEART CATHETERIZATION WITH CORONARY ANGIOGRAM N/A 08/07/2013   Procedure: LEFT AND RIGHT HEART CATHETERIZATION WITH CORONARY ANGIOGRAM;  Surgeon: Peter M Martinique, MD;  Location: [facility] CATH LAB;  Service: Cardiovascular;  Laterality: N/A;  . LUMBAR FUSION  02/2008  . PERCUTANEOUS CORONARY STENT INTERVENTION (PCI-S)  08/07/2013   Procedure: PERCUTANEOUS CORONARY STENT INTERVENTION (PCI-S);  Surgeon: Peter M Martinique, MD;  Location: [facility] CATH LAB;  Service: Cardiovascular;;  . PVI ablation  11/2014   Dr. Venita [facility]  . RIGHT/LEFT HEART CATH AND CORONARY ANGIOGRAPHY N/A 08/15/2018   Procedure: RIGHT/LEFT HEART CATH AND CORONARY ANGIOGRAPHY;  Surgeon: Nelva Bush, MD;  Location: [facility] CV LAB;  Service: Cardiovascular;  Laterality: N/A;  . TOTAL HIP ARTHROPLASTY Bilateral   . VASECTOMY       Social History   reports that he quit smoking about 19 years ago. His smoking use included cigarettes. He has a 80.00 pack-year smoking history. He has never used smokeless tobacco. He reports current alcohol use of about 2.0 - 3.0 standard drinks of alcohol per week. He reports that he does not use drugs.   Family History   His family history includes Alcohol abuse (age of onset: 75) in his brother; Atrial fibrillation in his mother; COPD in his mother; Colon cancer in his father; Heart attack in his paternal grandfather and paternal uncle; Heart attack (age of onset: 43) in his brother; Heart disease in his brother and father. There is no history of Stroke, Rectal cancer, Stomach cancer, or Esophageal cancer.   Allergies Allergies  Allergen Reactions  . Penicillins Anaphylaxis    Did it involve swelling of the  face/tongue/throat, SOB, or low BP? Yes Did it involve sudden or severe rash/hives, skin peeling, or any reaction on the inside of your mouth or nose? Yes Did you need to seek medical attention at a hospital or doctor's office? Yes When did it last happen?30 + years If all above answers are "NO", may proceed with cephalosporin use.      Home Medications  Prior to Admission medications   Medication Sig Start Date End Date Taking? Authorizing Provider  verapamil (CALAN) 80 MG tablet Take 1 tablet by mouth 2 (two) times daily. 10/18/18  Yes [provider]  albuterol (VENTOLIN HFA) 108 (90 Base) MCG/ACT inhaler Inhale 2 puffs into the lungs every 6 (six) hours as needed for wheezing or shortness of breath. 12/09/18   Martyn Ehrich, NP  allopurinol (ZYLOPRIM) 100 MG tablet Take 1 tablet (100 mg total) by mouth daily. 06/16/19   Binnie Rail, MD  aspirin 81 MG chewable tablet Chew 81 mg by mouth daily.  11/18/14   [provider]  atorvastatin (LIPITOR) 10 MG tablet TAKE 1 TABLET EVERY EVENING 05/23/19   Minus Breeding, MD  Cholecalciferol (VITAMIN D-3 PO) Take 1 capsule by mouth daily.    [provider]  Cholecalciferol (VITAMIN D3 PO) Take 1 capsule by mouth daily. Patient not taking: Reported on 10/25/2019    [provider]  eszopiclone (LUNESTA) 2 MG TABS tablet Take 2 mg by mouth at bedtime as needed. 10/11/19   [provider]  fluticasone (FLOVENT HFA) 44 MCG/ACT inhaler Inhale 2 puffs into the lungs at bedtime.    [provider]  Fluticasone-Umeclidin-Vilant (TRELEGY ELLIPTA) 100-62.5-25 MCG/INH AEPB Inhale 1 puff into the lungs daily.    [provider]  LORazepam (ATIVAN) 0.5 MG tablet Take 1 tablet (0.5 mg total) by mouth at bedtime as needed for anxiety or sleep. 10/06/19   Binnie Rail, MD  metFORMIN (GLUCOPHAGE) 500 MG tablet TAKE 1 TABLET TWICE A DAY WITH MEALS Patient taking differently: Take 500 mg by mouth 2  (two) times daily with a meal.  09/18/19   Burns, Claudina Lick, MD  metoprolol tartrate (LOPRESSOR) 25 MG tablet Take 1 tablet (25 mg total) by mouth 2 (two) times daily. 04/14/19   Minus Breeding, MD  metoprolol  tartrate (LOPRESSOR) 50 MG tablet Take 2 tablets by mouth 2 (two) times daily. Patient not taking: Reported on 10/17/2019 10/10/18   [provider]  nitroGLYCERIN (NITROSTAT) 0.4 MG SL tablet Place 1 tablet (0.4 mg total) under the tongue every 5 (five) minutes as needed for chest pain (up to 3 doses). 01/09/15   Minus Breeding, MD  oxyCODONE-acetaminophen (PERCOCET) 10-325 MG tablet Take 1 tablet by mouth 3 (three) times daily as needed. 10/03/19   [provider]  pantoprazole (PROTONIX) 40 MG tablet TAKE 1 TABLET DAILY Patient taking differently: Take 40 mg by mouth daily.  02/03/19   Minus Breeding, MD  traZODone (DESYREL) 50 MG tablet Take 50 mg by mouth at bedtime as needed for sleep.    [provider]  venlafaxine XR (EFFEXOR-XR) 75 MG 24 hr capsule TAKE 1 CAPSULE DAILY WITH BREAKFAST Patient taking differently: Take 75 mg by mouth daily with breakfast.  09/08/19   Burns, Claudina Lick, MD  verapamil (CALAN-SR) 120 MG CR tablet Take 1 tablet (120 mg total) by mouth at bedtime. 04/14/19   Minus Breeding, MD  vitamin B-12 (CYANOCOBALAMIN) 1000 MCG tablet Take 1,000 mcg by mouth daily.    [provider]  warfarin (COUMADIN) 3 MG tablet Take 1-2 tablets (3-6 mg total) by mouth See admin instructions. 6mg  everyday except on Friday take 3mg  04/02/19   Richardson Dopp T, PA-C     Critical care time: 70 minutes       CRITICAL CARE Performed by: Otilio Carpen Skyylar Kopf   Total critical care time: 70 minutes  Critical care time was exclusive of separately billable procedures and treating other patients.  Critical care was necessary to treat or prevent imminent or life-threatening deterioration.  Critical care was time spent personally by me on the following activities:  development of treatment plan with patient and/or surrogate as well as nursing, discussions with consultants, evaluation of patient's response to treatment, examination of patient, obtaining history from patient or surrogate, ordering and performing treatments and interventions, ordering and review of laboratory studies, ordering and review of radiographic studies, pulse oximetry and re-evaluation of patient's condition.   Otilio Carpen Jeanann Balinski, PA-C

## 2019-10-20 NOTE — Progress Notes (Signed)
Chaplain relieved chaplain Estill Bamberg at shift change.   Patient located in Trauma B.  Wife Santiago Glad was in Consult A.  Chaplain provided emotional support and prayer for wife.  The [facility] are [employer] and wanted chaplain to contact [place] but being Friday at 40 pm chaplain said it was not probable that minister was available.  Chaplain continued to offer support until catscan was complete and staff said wife could be bedside with husband.  Will continue to follow Ila  Pager [phone]

## 2019-10-21 ENCOUNTER — Encounter (HOSPITAL_COMMUNITY): Payer: Self-pay | Admitting: Internal Medicine

## 2019-10-21 ENCOUNTER — Inpatient Hospital Stay (HOSPITAL_COMMUNITY): Payer: Medicare Other

## 2019-10-21 DIAGNOSIS — R6521 Severe sepsis with septic shock: Secondary | ICD-10-CM

## 2019-10-21 DIAGNOSIS — I998 Other disorder of circulatory system: Secondary | ICD-10-CM

## 2019-10-21 DIAGNOSIS — E872 Acidosis: Secondary | ICD-10-CM

## 2019-10-21 DIAGNOSIS — J9601 Acute respiratory failure with hypoxia: Secondary | ICD-10-CM

## 2019-10-21 DIAGNOSIS — A419 Sepsis, unspecified organism: Secondary | ICD-10-CM | POA: Diagnosis not present

## 2019-10-21 DIAGNOSIS — I480 Paroxysmal atrial fibrillation: Secondary | ICD-10-CM

## 2019-10-21 DIAGNOSIS — Z7901 Long term (current) use of anticoagulants: Secondary | ICD-10-CM

## 2019-10-21 DIAGNOSIS — J9600 Acute respiratory failure, unspecified whether with hypoxia or hypercapnia: Secondary | ICD-10-CM

## 2019-10-21 LAB — OSMOLALITY: Osmolality: 308 mOsm/kg — ABNORMAL HIGH (ref 275–295)

## 2019-10-21 LAB — POCT I-STAT 7, (LYTES, BLD GAS, ICA,H+H)
Acid-base deficit: 14 mmol/L — ABNORMAL HIGH (ref 0.0–2.0)
Acid-base deficit: 15 mmol/L — ABNORMAL HIGH (ref 0.0–2.0)
Acid-base deficit: 14 mmol/L — ABNORMAL HIGH (ref 0.0–2.0)
Bicarbonate: 14.8 mmol/L — ABNORMAL LOW (ref 20.0–28.0)
Bicarbonate: 13.2 mmol/L — ABNORMAL LOW (ref 20.0–28.0)
Bicarbonate: 13.7 mmol/L — ABNORMAL LOW (ref 20.0–28.0)
Calcium, Ion: 0.87 mmol/L — CL (ref 1.15–1.40)
Calcium, Ion: 0.89 mmol/L — CL (ref 1.15–1.40)
Calcium, Ion: 0.85 mmol/L — CL (ref 1.15–1.40)
HCT: 34 % — ABNORMAL LOW (ref 39.0–52.0)
HCT: 32 % — ABNORMAL LOW (ref 39.0–52.0)
HCT: 30 % — ABNORMAL LOW (ref 39.0–52.0)
Hemoglobin: 11.6 g/dL — ABNORMAL LOW (ref 13.0–17.0)
Hemoglobin: 10.9 g/dL — ABNORMAL LOW (ref 13.0–17.0)
Hemoglobin: 10.2 g/dL — ABNORMAL LOW (ref 13.0–17.0)
O2 Saturation: 95 %
O2 Saturation: 94 %
O2 Saturation: 94 %
Patient temperature: 98.6
Patient temperature: 100
Patient temperature: 38.4
Potassium: 7.2 mmol/L (ref 3.5–5.1)
Potassium: 6.8 mmol/L (ref 3.5–5.1)
Potassium: 6.1 mmol/L — ABNORMAL HIGH (ref 3.5–5.1)
Sodium: 137 mmol/L (ref 135–145)
Sodium: 138 mmol/L (ref 135–145)
Sodium: 139 mmol/L (ref 135–145)
TCO2: 16 mmol/L — ABNORMAL LOW (ref 22–32)
TCO2: 14 mmol/L — ABNORMAL LOW (ref 22–32)
TCO2: 15 mmol/L — ABNORMAL LOW (ref 22–32)
pCO2 arterial: 45.7 mmHg (ref 32.0–48.0)
pCO2 arterial: 40.7 mmHg (ref 32.0–48.0)
pCO2 arterial: 39.4 mmHg (ref 32.0–48.0)
pH, Arterial: 7.117 — CL (ref 7.350–7.450)
pH, Arterial: 7.124 — CL (ref 7.350–7.450)
pH, Arterial: 7.157 — CL (ref 7.350–7.450)
pO2, Arterial: 99 mmHg (ref 83.0–108.0)
pO2, Arterial: 94 mmHg (ref 83.0–108.0)
pO2, Arterial: 95 mmHg (ref 83.0–108.0)

## 2019-10-21 LAB — LACTIC ACID, PLASMA
Lactic Acid, Venous: 7.3 mmol/L (ref 0.5–1.9)
Lactic Acid, Venous: 8 mmol/L (ref 0.5–1.9)

## 2019-10-21 LAB — BASIC METABOLIC PANEL
Anion gap: 19 — ABNORMAL HIGH (ref 5–15)
Anion gap: 22 — ABNORMAL HIGH (ref 5–15)
Anion gap: 30 — ABNORMAL HIGH (ref 5–15)
BUN: 28 mg/dL — ABNORMAL HIGH (ref 8–23)
BUN: 34 mg/dL — ABNORMAL HIGH (ref 8–23)
BUN: 48 mg/dL — ABNORMAL HIGH (ref 8–23)
CO2: 13 mmol/L — ABNORMAL LOW (ref 22–32)
CO2: 14 mmol/L — ABNORMAL LOW (ref 22–32)
CO2: 13 mmol/L — ABNORMAL LOW (ref 22–32)
Calcium: 6.7 mg/dL — ABNORMAL LOW (ref 8.9–10.3)
Calcium: 6.5 mg/dL — ABNORMAL LOW (ref 8.9–10.3)
Calcium: 6.8 mg/dL — ABNORMAL LOW (ref 8.9–10.3)
Chloride: 107 mmol/L (ref 98–111)
Chloride: 104 mmol/L (ref 98–111)
Chloride: 100 mmol/L (ref 98–111)
Creatinine, Ser: 3.42 mg/dL — ABNORMAL HIGH (ref 0.61–1.24)
Creatinine, Ser: 4.24 mg/dL — ABNORMAL HIGH (ref 0.61–1.24)
Creatinine, Ser: 5.85 mg/dL — ABNORMAL HIGH (ref 0.61–1.24)
GFR calc Af Amer: 19 mL/min — ABNORMAL LOW (ref >60)
GFR calc Af Amer: 15 mL/min — ABNORMAL LOW (ref >60)
GFR calc Af Amer: 10 mL/min — ABNORMAL LOW (ref >60)
GFR calc non Af Amer: 17 mL/min — ABNORMAL LOW (ref >60)
GFR calc non Af Amer: 13 mL/min — ABNORMAL LOW (ref >60)
GFR calc non Af Amer: 9 mL/min — ABNORMAL LOW (ref >60)
Glucose, Bld: 141 mg/dL — ABNORMAL HIGH (ref 70–99)
Glucose, Bld: 146 mg/dL — ABNORMAL HIGH (ref 70–99)
Glucose, Bld: 143 mg/dL — ABNORMAL HIGH (ref 70–99)
Potassium: 5.9 mmol/L — ABNORMAL HIGH (ref 3.5–5.1)
Potassium: 7.2 mmol/L (ref 3.5–5.1)
Potassium: 5.8 mmol/L — ABNORMAL HIGH (ref 3.5–5.1)
Sodium: 139 mmol/L (ref 135–145)
Sodium: 140 mmol/L (ref 135–145)
Sodium: 143 mmol/L (ref 135–145)

## 2019-10-21 LAB — CBC
HCT: 33.4 % — ABNORMAL LOW (ref 39.0–52.0)
Hemoglobin: 10.3 g/dL — ABNORMAL LOW (ref 13.0–17.0)
MCH: 34 pg (ref 26.0–34.0)
MCHC: 30.8 g/dL (ref 30.0–36.0)
MCV: 110.2 fL — ABNORMAL HIGH (ref 80.0–100.0)
Platelets: 383 10*3/uL (ref 150–400)
RBC: 3.03 MIL/uL — ABNORMAL LOW (ref 4.22–5.81)
RDW: 20.8 % — ABNORMAL HIGH (ref 11.5–15.5)
WBC: 20.7 10*3/uL — ABNORMAL HIGH (ref 4.0–10.5)
nRBC: 4.5 % — ABNORMAL HIGH (ref 0.0–0.2)

## 2019-10-21 LAB — HEPATIC FUNCTION PANEL
ALT: 1704 U/L — ABNORMAL HIGH (ref 0–44)
ALT: 1630 U/L — ABNORMAL HIGH (ref 0–44)
AST: 2416 U/L — ABNORMAL HIGH (ref 15–41)
AST: 1979 U/L — ABNORMAL HIGH (ref 15–41)
Albumin: 2.6 g/dL — ABNORMAL LOW (ref 3.5–5.0)
Albumin: 2.7 g/dL — ABNORMAL LOW (ref 3.5–5.0)
Alkaline Phosphatase: 66 U/L (ref 38–126)
Alkaline Phosphatase: 63 U/L (ref 38–126)
Bilirubin, Direct: <0.1 mg/dL (ref 0.0–0.2)
Bilirubin, Direct: 0.2 mg/dL (ref 0.0–0.2)
Indirect Bilirubin: 0.9 mg/dL (ref 0.3–0.9)
Total Bilirubin: 0.9 mg/dL (ref 0.3–1.2)
Total Bilirubin: 1.1 mg/dL (ref 0.3–1.2)
Total Protein: 5.1 g/dL — ABNORMAL LOW (ref 6.5–8.1)
Total Protein: 5.3 g/dL — ABNORMAL LOW (ref 6.5–8.1)

## 2019-10-21 LAB — MRSA PCR SCREENING: MRSA by PCR: NEGATIVE

## 2019-10-21 LAB — TROPONIN I (HIGH SENSITIVITY)
Troponin I (High Sensitivity): 2400 ng/L (ref <18)
Troponin I (High Sensitivity): 2681 ng/L (ref <18)
Troponin I (High Sensitivity): 3300 ng/L (ref <18)
Troponin I (High Sensitivity): 3303 ng/L (ref <18)

## 2019-10-21 LAB — PROTIME-INR
INR: 2.4 — ABNORMAL HIGH (ref 0.8–1.2)
INR: 2.7 — ABNORMAL HIGH (ref 0.8–1.2)
INR: 2.8 — ABNORMAL HIGH (ref 0.8–1.2)
Prothrombin Time: 25 seconds — ABNORMAL HIGH (ref 11.4–15.2)
Prothrombin Time: 27.7 seconds — ABNORMAL HIGH (ref 11.4–15.2)
Prothrombin Time: 28.5 seconds — ABNORMAL HIGH (ref 11.4–15.2)

## 2019-10-21 LAB — ACETAMINOPHEN LEVEL
Acetaminophen (Tylenol), Serum: 24 ug/mL (ref 10–30)
Acetaminophen (Tylenol), Serum: 17 ug/mL (ref 10–30)

## 2019-10-21 LAB — HEPATITIS PANEL, ACUTE
HCV Ab: NONREACTIVE
Hep A IgM: NONREACTIVE
Hep B C IgM: NONREACTIVE
Hepatitis B Surface Ag: NONREACTIVE

## 2019-10-21 LAB — GLUCOSE, CAPILLARY
Glucose-Capillary: 122 mg/dL — ABNORMAL HIGH (ref 70–99)
Glucose-Capillary: 111 mg/dL — ABNORMAL HIGH (ref 70–99)
Glucose-Capillary: 111 mg/dL — ABNORMAL HIGH (ref 70–99)
Glucose-Capillary: 117 mg/dL — ABNORMAL HIGH (ref 70–99)
Glucose-Capillary: 111 mg/dL — ABNORMAL HIGH (ref 70–99)

## 2019-10-21 LAB — MAGNESIUM: Magnesium: 2.6 mg/dL — ABNORMAL HIGH (ref 1.7–2.4)

## 2019-10-21 LAB — CK: Total CK: 5399 U/L — ABNORMAL HIGH (ref 49–397)

## 2019-10-21 LAB — ECHOCARDIOGRAM COMPLETE BUBBLE STUDY: Area-P 1/2: 2.42 cm2

## 2019-10-21 LAB — TRIGLYCERIDES: Triglycerides: 173 mg/dL — ABNORMAL HIGH (ref <150)

## 2019-10-21 LAB — AMMONIA: Ammonia: 71 umol/L — ABNORMAL HIGH (ref 9–35)

## 2019-10-21 LAB — PHOSPHORUS: Phosphorus: 8 mg/dL — ABNORMAL HIGH (ref 2.5–4.6)

## 2019-10-21 MED ORDER — FENTANYL BOLUS VIA INFUSION
25.0000 ug | INTRAVENOUS | Status: DC | PRN
  Filled 2019-10-21: qty 25

## 2019-10-21 MED ORDER — SODIUM BICARBONATE 8.4 % IV SOLN
100.0000 meq | Freq: Once | INTRAVENOUS | Status: AC
  Administered 2019-10-21: 100 meq via INTRAVENOUS
  Filled 2019-10-21: qty 100

## 2019-10-21 MED ORDER — SODIUM ZIRCONIUM CYCLOSILICATE 10 G PO PACK
10.0000 g | PACK | Freq: Two times a day (BID) | ORAL | Status: DC
  Administered 2019-10-21 – 2019-10-22 (×3): 10 g
  Filled 2019-10-21 (×5): qty 1

## 2019-10-21 MED ORDER — ALTEPLASE 2 MG IJ SOLR: 2.0000 mg | Freq: Once | INTRAMUSCULAR | Status: DC | PRN

## 2019-10-21 MED ORDER — HEPARIN SODIUM (PORCINE) 1000 UNIT/ML DIALYSIS
1000.0000 [IU] | INTRAMUSCULAR | Status: DC | PRN
  Filled 2019-10-21: qty 6

## 2019-10-21 MED ORDER — STERILE WATER FOR INJECTION IV SOLN
INTRAVENOUS | Status: DC
  Filled 2019-10-21 (×10): qty 150

## 2019-10-21 MED ORDER — NOREPINEPHRINE-SODIUM CHLORIDE 16-0.9 MG/250ML-% IV SOLN
0.0000 ug/min | INTRAVENOUS | Status: DC
  Administered 2019-10-21: 26 ug/min via INTRAVENOUS
  Administered 2019-10-21: 32 ug/min via INTRAVENOUS
  Administered 2019-10-22: 40 ug/min via INTRAVENOUS
  Filled 2019-10-21 (×10): qty 250

## 2019-10-21 MED ORDER — PRISMASOL BGK 4/2.5 32-4-2.5 MEQ/L REPLACEMENT SOLN
Status: DC
  Filled 2019-10-21 (×3): qty 5000

## 2019-10-21 MED ORDER — CALCIUM GLUCONATE-NACL 2-0.675 GM/100ML-% IV SOLN
2.0000 g | Freq: Once | INTRAVENOUS | Status: AC
  Administered 2019-10-21: 2000 mg via INTRAVENOUS
  Filled 2019-10-21: qty 100

## 2019-10-21 MED ORDER — SODIUM CHLORIDE 0.9 % IV SOLN
2.0000 g | INTRAVENOUS | Status: DC
  Filled 2019-10-21: qty 2

## 2019-10-21 MED ORDER — PRISMASOL BGK 4/2.5 32-4-2.5 MEQ/L IV SOLN
INTRAVENOUS | Status: DC
  Filled 2019-10-21 (×15): qty 5000

## 2019-10-21 MED ORDER — FENTANYL CITRATE (PF) 100 MCG/2ML IJ SOLN
100.0000 ug | INTRAMUSCULAR | Status: DC | PRN
  Administered 2019-10-21: 100 ug via INTRAVENOUS

## 2019-10-21 MED ORDER — FENTANYL CITRATE (PF) 100 MCG/2ML IJ SOLN
25.0000 ug | Freq: Once | INTRAMUSCULAR | Status: AC
  Administered 2019-10-21: 25 ug via INTRAVENOUS

## 2019-10-21 MED ORDER — SODIUM CHLORIDE 0.9 % FOR CRRT: INTRAVENOUS_CENTRAL | Status: DC | PRN

## 2019-10-21 MED ORDER — ASPIRIN 325 MG PO TABS
325.0000 mg | ORAL_TABLET | Freq: Once | ORAL | Status: AC
  Administered 2019-10-21: 325 mg
  Filled 2019-10-21: qty 1

## 2019-10-21 MED ORDER — SODIUM BICARBONATE 8.4 % IV SOLN
INTRAVENOUS | Status: DC
  Filled 2019-10-21: qty 150

## 2019-10-21 MED ORDER — SODIUM BICARBONATE 8.4 % IV SOLN
50.0000 meq | Freq: Once | INTRAVENOUS | Status: AC
  Administered 2019-10-21: 50 meq via INTRAVENOUS
  Filled 2019-10-21: qty 50

## 2019-10-21 MED ORDER — SODIUM CHLORIDE 0.9 % IV SOLN
INTRAVENOUS | Status: DC | PRN
  Administered 2019-10-21 (×2): 250 mL via INTRAVENOUS

## 2019-10-21 MED ORDER — SODIUM BICARBONATE 8.4 % IV SOLN
INTRAVENOUS | Status: AC
  Filled 2019-10-21: qty 50

## 2019-10-21 MED ORDER — DEXMEDETOMIDINE HCL IN NACL 400 MCG/100ML IV SOLN
0.4000 ug/kg/h | INTRAVENOUS | Status: DC
  Administered 2019-10-21: 1.2 ug/kg/h via INTRAVENOUS
  Administered 2019-10-21: 0.4 ug/kg/h via INTRAVENOUS
  Administered 2019-10-22: 0.9 ug/kg/h via INTRAVENOUS
  Administered 2019-10-22: 1.5 ug/kg/h via INTRAVENOUS
  Administered 2019-10-22 (×2): 1 ug/kg/h via INTRAVENOUS
  Filled 2019-10-21: qty 200
  Filled 2019-10-21 (×3): qty 100
  Filled 2019-10-21: qty 200

## 2019-10-21 MED ORDER — SODIUM BICARBONATE-DEXTROSE 150-5 MEQ/L-% IV SOLN
150.0000 meq | INTRAVENOUS | Status: DC
  Administered 2019-10-21 (×2): 150 meq via INTRAVENOUS
  Filled 2019-10-21 (×2): qty 1000

## 2019-10-21 MED ORDER — LACTATED RINGERS IV BOLUS
500.0000 mL | Freq: Once | INTRAVENOUS | Status: AC
  Administered 2019-10-21: 500 mL via INTRAVENOUS

## 2019-10-21 MED ORDER — FENTANYL 2500MCG IN NS 250ML (10MCG/ML) PREMIX INFUSION
25.0000 ug/h | INTRAVENOUS | Status: DC
  Administered 2019-10-21: 100 ug/h via INTRAVENOUS
  Filled 2019-10-21: qty 250

## 2019-10-21 MED ORDER — SODIUM BICARBONATE 8.4 % IV SOLN
50.0000 meq | Freq: Once | INTRAVENOUS | Status: AC
  Administered 2019-10-21: 50 meq via INTRAVENOUS

## 2019-10-21 MED ORDER — SODIUM ZIRCONIUM CYCLOSILICATE 10 G PO PACK: 10.0000 g | PACK | Freq: Two times a day (BID) | ORAL | Status: DC

## 2019-10-21 MED ORDER — SODIUM CHLORIDE 0.9 % IV SOLN
2.0000 g | Freq: Two times a day (BID) | INTRAVENOUS | Status: DC
  Administered 2019-10-21 – 2019-10-22 (×2): 2 g via INTRAVENOUS
  Filled 2019-10-21 (×3): qty 2

## 2019-10-21 MED ORDER — SODIUM CHLORIDE 0.9 % IV SOLN
15.0000 mg/kg | Freq: Once | INTRAVENOUS | Status: AC
  Administered 2019-10-21: 1850 mg via INTRAVENOUS
  Filled 2019-10-21: qty 1.85

## 2019-10-21 MED ORDER — ASPIRIN 325 MG PO TABS: 325.0000 mg | ORAL_TABLET | Freq: Once | ORAL | Status: DC

## 2019-10-21 MED ORDER — PERFLUTREN LIPID MICROSPHERE
INTRAVENOUS | Status: AC
  Filled 2019-10-21: qty 10

## 2019-10-21 MED ORDER — SODIUM BICARBONATE-DEXTROSE 150-5 MEQ/L-% IV SOLN: 150.0000 meq | INTRAVENOUS | Status: DC

## 2019-10-21 MED ORDER — VASOPRESSIN 20 UNITS/100 ML INFUSION FOR SHOCK
0.0000 [IU]/min | INTRAVENOUS | Status: DC
  Administered 2019-10-21 – 2019-10-22 (×4): 0.03 [IU]/min via INTRAVENOUS
  Filled 2019-10-21 (×5): qty 100

## 2019-10-21 MED ORDER — SODIUM ZIRCONIUM CYCLOSILICATE 10 G PO PACK
10.0000 g | PACK | Freq: Every day | ORAL | Status: DC
  Administered 2019-10-21: 10 g
  Filled 2019-10-21: qty 1

## 2019-10-21 MED ORDER — ALBUMIN HUMAN 5 % IV SOLN
25.0000 g | Freq: Once | INTRAVENOUS | Status: AC
  Administered 2019-10-21: 25 g via INTRAVENOUS
  Filled 2019-10-21: qty 500

## 2019-10-21 MED ORDER — FENTANYL CITRATE (PF) 100 MCG/2ML IJ SOLN
INTRAMUSCULAR | Status: AC
  Filled 2019-10-21: qty 2

## 2019-10-21 NOTE — Progress Notes (Signed)
PCCM interval progress note:  Pt's troponin quite elevated at 2,681, repeat EKG without ischemic changes.  Discussed with Dr. Apolinar Junes, given pt's significant cardiac history will start heparin gtt and resume Asa.  Echo pending, continue to trend Trop.  Otilio Carpen Timur Nibert, PA-C

## 2019-10-21 NOTE — Progress Notes (Signed)
[facility] Progress Note Patient Name: [name] [name] DOB: May 21, 1945 MRN: [id number]   Date of Service  10/21/2019  HPI/Events of Note  Troponin 2400, r/o acute coronary syndrome  eICU Interventions  Serial Troponin, ECHO and 12 lead EKG ordered.  If there is a significant increase in interval Troponin will consider anticoagulation and cardiology consultation.        Kerry Kass Ogle Hoeffner 10/21/2019, 4:09 AM

## 2019-10-21 NOTE — Progress Notes (Signed)
  Echocardiogram 2D Echocardiogram has been performed.  [name] [name] 10/21/2019, 8:55 AM

## 2019-10-21 NOTE — Progress Notes (Signed)
CRITICAL VALUE ALERT  Critical Value:  Troponin 2400  Date & Time Notied:  10/21/2019 approx 0300  Provider Notified: PA Gleason; Dr. Lucile Shutters  Orders Received/Actions taken: Providers notified. Awaiting new orders

## 2019-10-21 NOTE — Progress Notes (Signed)
ANTICOAGULATION CONSULT NOTE - Initial Consult  Pharmacy Consult for Heparin Indication: chest pain/ACS  Allergies  Allergen Reactions  . Penicillins Anaphylaxis    Did it involve swelling of the face/tongue/throat, SOB, or low BP? Yes Did it involve sudden or severe rash/hives, skin peeling, or any reaction on the inside of your mouth or nose? Yes Did you need to seek medical attention at a hospital or doctor's office? Yes When did it last happen?30 + years If all above answers are "NO", may proceed with cephalosporin use.     Patient Measurements: Height: '6\' 2"'  (188 cm) Weight: 123.4 kg (272 lb 1.6 oz) IBW/kg (Calculated) : 82.2 Heparin Dosing Weight: 110 kg  Vital Signs: Temp: 99.3 F (37.4 C) (08/14 0515) BP: 131/65 (08/14 0515) Pulse Rate: 85 (08/14 0515)  Labs: Recent Labs    10/10/2019 1628 10/24/2019 1951 10/11/2019 2029 10/21/2019 2029 10/19/2019 2256 10/21/19 0113 10/21/19 0207 10/21/19 0352 10/21/19 0400  HGB 10.3*   < > 10.7*   < > 11.2*  --   --  11.6*  --   HCT 38.4*   < > 36.1*  --  33.0*  --   --  34.0*  --   PLT 623*  --  451*  --   --   --   --   --   --   APTT 56*  --   --   --   --   --   --   --   --   LABPROT 24.6*  --  21.7*  --   --   --   --   --   --   INR 2.3*  --  2.0*  --   --   --   --   --   --   CREATININE 3.31*  --  3.21*  --   --  3.42*  --   --   --   TROPONINIHS  --   --   --   --   --   --  2,400*  --  2,681*   < > = values in this interval not displayed.    Estimated Creatinine Clearance: 26.1 mL/min (A) (by C-G formula based on SCr of 3.42 mg/dL (H)).   Medical History: Past Medical History:  Diagnosis Date  . Arthritis   . Asthma   . BPH (benign prostatic hypertrophy)   . CAD (coronary artery disease)    a. 07/2013: s/p DES to LAD, normal LVF.  [name] Complication of anesthesia    "I got all kinds of hallucinations"  . COPD (chronic obstructive pulmonary disease) ([place])    a. 07/2013 PFT's mild airflow obstruction, no  restriction, sev decrease in DLCO.  . Diabetes mellitus without complication ([place])   . Diverticulosis   . DVT (deep venous thrombosis) ([place])    a. 2010 Lower ext s/p back surgery.  [name] Dyspnea on exertion    a. 07/2013 PFT's mild airflow obstr   . GERD (gastroesophageal reflux disease)   . History of gout   . History of hiatal hernia   . Hx of echocardiogram 2015   Echo (06/2013): EF 60-65% normal wall motion, normal diastolic function, aortic sclerosis without stenosis, Trivial MR, mild SAM due to long, redundant mitral leaflets, mild RAE, normal RVSF  . Hypertension   . Macrocytic anemia    a. 07/2013: documented on prior labs.  . Obesity   . OSA (obstructive sleep apnea) 11/14/2015  . PAF (paroxysmal atrial  fibrillation) ([place])    a. Flecainide discontinued 07/2013 in setting of CAD.; b. s/p PVI Ablation at [facility] (Dr Joseph Berkshire) 11/2014  . Pneumonia ~ 2010 X 1  . Pulmonary embolism ([place])    a. 2010 in setting of DVT post-op back surgery. b. Low prob VQ 07/2013.  [name] Sleep apnea    on cpap  . Spinal stenosis    [place]  . Transaminitis    a. 07/2013: mild.  [name] Ulcerative proctitis ([place]) 08/25/2011    Medications:  Scheduled:  . aspirin  325 mg Per Tube Once  . chlorhexidine gluconate (MEDLINE KIT)  15 mL Mouth Rinse BID  . Chlorhexidine Gluconate Cloth  6 each Topical Daily  . docusate  100 mg Oral BID  . fentaNYL      . fentaNYL (SUBLIMAZE) injection  100 mcg Intravenous Once  . fentaNYL (SUBLIMAZE) injection  25 mcg Intravenous Once  . insulin aspart  0-15 Units Subcutaneous Q4H  . insulin aspart  5 Units Intravenous Once  . ipratropium-albuterol  3 mL Nebulization Q6H  . mouth rinse  15 mL Mouth Rinse 10 times per day  . pantoprazole (PROTONIX) IV  40 mg Intravenous Daily  . polyethylene glycol  17 g Oral Daily  . sodium zirconium cyclosilicate  10 g Per Tube Daily    Assessment: 74 y.o. male admitted with AMS/sepsis, h/o Afib on Coumadin at home.  Pharmacy consulted  for heparin for possible ACS.  INR this morning 2.4  Goal of Therapy:  Heparin level 0.3-0.7 units/ml Monitor platelets by anticoagulation protocol: Yes   Plan:  Will hold off starting heparin for now per Dr. Rennis Harding F/U plan and recheck INR as needed  Caryl Pina 10/21/2019,6:37 AM

## 2019-10-21 NOTE — Progress Notes (Signed)
[place] for Heparin Indication: chest pain/ACS  Allergies  Allergen Reactions  . Penicillins Anaphylaxis    Did it involve swelling of the face/tongue/throat, SOB, or low BP? Yes Did it involve sudden or severe rash/hives, skin peeling, or any reaction on the inside of your mouth or nose? Yes Did you need to seek medical attention at a hospital or doctor's office? Yes When did it last happen?30 + years If all above answers are "NO", may proceed with cephalosporin use.     Patient Measurements: Height: _0  (188 cm) Weight: 123.4 kg (272 lb 1.6 oz) IBW/kg (Calculated) : 82.2 Heparin Dosing Weight: 110 kg  Vital Signs: Temp: 101.5 F (38.6 C) (08/14 1800) Temp Source: Bladder (08/14 1600) BP: 96/53 (08/14 1600) Pulse Rate: 96 (08/14 1800)  Labs: Recent Labs    10/21/2019 1628 11/06/2019 1951 10/29/2019 2029 10/17/2019 2256 10/21/19 0113 10/21/19 0207 10/21/19 0352 10/21/19 0400 10/21/19 0544 10/21/19 0544 10/21/19 0600 10/21/19 1007 10/21/19 1533 10/21/19 1800  HGB 10.3*   < > 10.7*   < >  --    < >   < >  --  10.3*   < >  --  10.9* 10.2*  --   HCT 38.4*   < > 36.1*   < >  --    < >   < >  --  33.4*  --   --  32.0* 30.0*  --   PLT 623*  --  451*  --   --   --   --   --  383  --   --   --   --   --   APTT 56*  --   --   --   --   --   --   --   --   --   --   --   --   --   LABPROT 24.6*   < > 21.7*  --   --   --   --   --  25.0*  --   --   --   --  27.7*  INR 2.3*   < > 2.0*  --   --   --   --   --  2.4*  --   --   --   --  2.7*  CREATININE 3.31*   < > 3.21*  --  3.42*  --   --   --   --   --  4.24*  --   --   --   TROPONINIHS  --   --   --   --   --    < >  --  2,681* 3,303*  --  3,300*  --   --   --    < > = values in this interval not displayed.    Estimated Creatinine Clearance: 21 mL/min (A) (by C-G formula based on SCr of 4.24 mg/dL (H)).   Medical History: Past Medical History:  Diagnosis Date  . Arthritis   .  Asthma   . BPH (benign prostatic hypertrophy)   . CAD (coronary artery disease)    a. 07/2013: s/p DES to LAD, normal LVF.  [name] Complication of anesthesia    "I got all kinds of hallucinations"  . COPD (chronic obstructive pulmonary disease) ([place])    a. 07/2013 PFT's mild airflow obstruction, no restriction, sev decrease in DLCO.  . Diabetes mellitus without complication ([place])   .  Diverticulosis   . DVT (deep venous thrombosis) ([place])    a. 2010 Lower ext s/p back surgery.  [name] Dyspnea on exertion    a. 07/2013 PFT's mild airflow obstr   . GERD (gastroesophageal reflux disease)   . History of gout   . History of hiatal hernia   . Hx of echocardiogram 2015   Echo (06/2013): EF 60-65% normal wall motion, normal diastolic function, aortic sclerosis without stenosis, Trivial MR, mild SAM due to long, redundant mitral leaflets, mild RAE, normal RVSF  . Hypertension   . Macrocytic anemia    a. 07/2013: documented on prior labs.  . Obesity   . OSA (obstructive sleep apnea) 11/14/2015  . PAF (paroxysmal atrial fibrillation) ([place])    a. Flecainide discontinued 07/2013 in setting of CAD.; b. s/p PVI Ablation at [facility] (Dr Joseph Berkshire) 11/2014  . Pneumonia ~ 2010 X 1  . Pulmonary embolism ([place])    a. 2010 in setting of DVT post-op back surgery. b. Low prob VQ 07/2013.  [name] Sleep apnea    on cpap  . Spinal stenosis    [place]  . Transaminitis    a. 07/2013: mild.  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 chlorhexidine gluconate (MEDLINE KIT)  15 mL Mouth Rinse BID  . Chlorhexidine Gluconate Cloth  6 each Topical Daily  . docusate  100 mg Oral BID  . insulin aspart  0-15 Units Subcutaneous Q4H  . insulin aspart  5 Units Intravenous Once  . ipratropium-albuterol  3 mL Nebulization Q6H  . mouth rinse  15 mL Mouth Rinse 10 times per day  . pantoprazole (PROTONIX) IV  40 mg Intravenous Daily  . polyethylene glycol  17 g Oral Daily  . sodium zirconium cyclosilicate   10 g Per Tube BID    Assessment: 74 y.o. male admitted with AMS/sepsis, h/o Afib on Coumadin at home.  Pharmacy consulted for heparin for possible ACS.   INR this morning 2.4 - increased further tonight to 2.7. Last dose listed as 8/8 - held for spinal injection. LFTs elevated but trending down slightly (AST [id number], ALT 1704>1630). No s/sx of bleeding.   Goal of Therapy:  Heparin level 0.3-0.7 units/ml Monitor platelets by anticoagulation protocol: Yes   Plan:  Will hold off starting heparin given INR F/U plan and recheck INR with AM labs to see if Feliberto Harts, PharmD, [place] Pharmacist  Phone: [phone] 10/21/2019 7:44 PM  Please check AMION for all [place] phone numbers After 10:00 PM, call [place] [phone]

## 2019-10-21 NOTE — Progress Notes (Signed)
[place] Progress Note Patient Name: [name] [name] DOB: Sep 03, 1945 MRN: [id number]   Date of Service  10/21/2019  HPI/Events of Note  Profound metabolic acidosis and hyperkalemia, also hypocalcemia, interval change in troponin is not significant and patient presents considerable anti-coagulation bleeding risk.  eICU Interventions  Sodium bicarbonate bolus + infusion, Calcium gluconate 2 gm iv bolus, Lokelma 10 gm via NG tube now.        Kerry Kass Lenyx Boody 10/21/2019, 5:34 AM

## 2019-10-21 NOTE — Progress Notes (Signed)
NAME:  [name] [name], MRN:  [id number], DOB:  04-29-1945, LOS: 1 ADMISSION DATE:  10/10/2019, CONSULTATION DATE:  10/16/2019 REFERRING MD:  EDP, CHIEF COMPLAINT:  AMS, respiratory insufficiency   Brief History   74 y.o. M with PMH of CAD s/p PCI/DES to LAD in 2015, AAA, a-fib on coumadin, HTN, OSA and COPD sees Dr. Halford Chessman, DM and hx DVT/PE who was found unresponsive in bed by his wife.  EMS found patient bradycardic with HR in  40's.   He was intubated for GCS of 3.  Found to have AKI with hyperkalemia (high of 7.0), elevated Tylenol level (58) and transaminases (ALT/AST 1234/1550) and INR 2.0.  History of present illness   [name] is a 74 y.o. M with PMH of  CAD s/p PCI/DES to LAD in 2015, AAA, a-fib on coumadin, HTN, OSA and COPD sees Dr. Halford Chessman, DM and hx DVT/PE who was brought in unresponsive and bradyacardic.  Pt's wife reports that he has been having a lot of back pain and taking Tylenol and prescribed oxycodone.  He seemed more tired today, but otherwise no new complaints.  He never got out of bed this morning, she checked on him multiple times and he reported being tired, but around 2pm she found him unresponsive and called EMS.  He was initially bradycardic in the 40's with GCS of 3.  He was bagged and externally paced, then intubated in the ED. Requiring Levophed. Narcan was reportedly administered without response  Head CT negative, CT chest/abdomen/pelvis significant for multi-focal patchy airspace opacities.  Labs with initial K of 6.5, creatinine 3.3, elevated LFT's, lactic acid >11 and WBC 22k.  Tylenol level also returned elevated at 89.  Ph 7.0, pCO2 57, pO2 319, Bicarb 15.  He was given an amp of Bicarb, 5 units insulin, Lokelma, Vancomycin, Cefepime and 3L IVF and PCCM consulted for admission.  Pt did have an episode of wakefulness with pulling at ETT, he was placed on propofol.  Past Medical History  has a past medical history of Arthritis, Asthma, BPH (benign prostatic  hypertrophy), CAD (coronary artery disease), Complication of anesthesia, COPD (chronic obstructive pulmonary disease) ([place]), Diabetes mellitus without complication ([place]), Diverticulosis, DVT (deep venous thrombosis) ([place]), Dyspnea on exertion, GERD (gastroesophageal reflux disease), History of gout, History of hiatal hernia, echocardiogram (2015), Hypertension, Macrocytic anemia, Obesity, OSA (obstructive sleep apnea) (11/14/2015), PAF (paroxysmal atrial fibrillation) ([place]), Pneumonia (~ 2010 X 1), Pulmonary embolism ([place]), Sleep apnea, Spinal stenosis, Transaminitis, and Ulcerative proctitis ([place]) (08/25/2011).  Consults:  Nephrology  Procedures:  8/13 ETT 8/13 L IJ CVC 8/13 L radial A-line  Significant Diagnostic Tests:  8/13 CT head/abd/pelvis>>Multifocal tree-in-bud and patchy airspace opacities seen throughout the right lung 8/14: Bubble ECHO: Left Ventricle: Peak LVOT gradient 48 mmHg. Left ventricular ejection fraction, by estimation, is 60 to 65%. The left ventricle has normal function. The left ventricle has no regional wall motion abnormalities. Left ventricular diastolic parameters were  normal. Right ventricular systolic function is normal.  Micro Data:  8/13 BCx2>> 8/13 Respiratory culture>> 8/13 Sars-CoV-2>>neg  Antimicrobials:  Vancomycin 8/13- Cefepime 8/13>   Interim history/subjective:  Remains sedated  Objective   Blood pressure 135/69, pulse 90, temperature 99.9 F (37.7 C), temperature source Oral, resp. rate (!) 26, height '6\' 2"'$  (1.88 m), weight 123.4 kg, SpO2 95 %.    Vent Mode: PRVC FiO2 (%):  [50 %-100 %] 70 % Set Rate:  [18 bmp-26 bmp] 26 bmp Vt Set:  [650 mL] 650 mL PEEP:  [  [place] Pressure:  [19 cmH20-21 cmH20] 19 cmH20   Intake/Output Summary (Last 24 hours) at 10/21/2019 1002 Last data filed at 10/21/2019 0800 Gross per 24 hour  Intake 4223.86 ml  Output 95 ml  Net 4128.86 ml   Filed Weights   10/23/2019 1818  Weight: 123.4 kg     Examination: General: WD, overwt WN sedated on vent HENT: [place]/AT. Pupils small and sluggishly reactive ETT/OG in place Lungs: Clear bilaterally, anteriorly and laterally Cardiovascular: 8-8/5 systolic murmur. No r/g Abdomen: Soft, no guarding Extremities: Dusky toes with no PT/DP palpable pulses Neuro: Grimices with noxious stimula GU: Foley  Assessment & Plan:  1) Encephalopathy and respiratory insufficiency likely secondary to septic shock and multi-organ failure with elevated Tylenol level -Repeat NH3 level and transaminases lower -Still acidemic with ABGs on FIO2 0.7: 94/94/40.7/7.12 and INR 2.4 -Will continue Mucomyst x24 hours total as acetaminophen level is -Maintain full vent support with SAT/SBT as tolerated -titrate Vent setting to maintain SpO2 greater than or equal to 90%. -HOB elevated 30 degrees. -Plateau pressures less than 30 cm H20.  -Follow chest x-ray, ABGprn.  -Bronchial hygiene and RT/bronchodilator protocol.within "therapeuic" range and LFTs down-trending 2) Acute non-oliguric kidney injury and hyperkalemia  -Last K+ 6.8. Has received 2 doses of Lokelma. -Will schedule BID Lokelma and follow trend 3) AG Met. Acidosis -Likely secondary to renal failure and lactic acidosis -Continue NaHCO3 drip and trend lactate levels 4) CAD, HTN, Atrial fibrillation -ECG and ECHO not suggestive of ACS. Eelvated troponins likely related to renal faliure -Still on pressors but with titrate to maintain MAP at least 65 5) Hx COPD and OSA -Uses CPAP at home. PFTs 2018 revealed mild airflow obstruction with impaired diffusion -Continue BDs 6) Type II DM -Cont. SSI  Best practice:  Diet: NPO Pain/Anxiety/Delirium protocol (if indicated): Propofol/fentanyl VAP protocol (if indicated): Yes DVT prophylaxis: SCDs GI prophylaxis: PPI Glucose control: SSI Mobility: BR Code Status: Full Family Communication: Spoke with wife and daughter Disposition: ICU  Labs    CBC: Recent Labs  Lab 10/14/2019 1628 11/02/2019 1628 10/19/2019 1951 10/25/2019 2029 10/11/2019 2256 10/21/19 0352 10/21/19 0544  WBC 23.9*  --   --  27.8*  --   --  20.7*  NEUTROABS 16.0*  --   --   --   --   --   --   HGB 10.3*   < > 11.2* 10.7* 11.2* 11.6* 10.3*  HCT 38.4*   < > 33.0* 36.1* 33.0* 34.0* 33.4*  MCV 134.3*  --   --  116.8*  --   --  110.2*  PLT 623*  --   --  451*  --   --  383   < > = values in this interval not displayed.    Basic Metabolic Panel: Recent Labs  Lab 10/26/2019 1628 10/08/2019 1951 11/07/2019 2029 10/15/2019 2256 10/21/19 0113 10/21/19 0352 10/21/19 0544 10/21/19 0600  NA 140   < > 139 139 139 137  --  140  K 6.5*   < > 7.0* 5.9* 5.9* 7.2*  --  7.2*  CL 100  --  106  --  107  --   --  104  CO2 9*  --  16*  --  13*  --   --  14*  GLUCOSE 314*  --  175*  --  141*  --   --  146*  BUN 21  --  22  --  28*  --   --  34*  CREATININE 3.31*  --  3.21*  --  3.42*  --   --  4.24*  CALCIUM 8.7*  --  7.5*  --  6.7*  --   --  6.5*  MG  --   --   --   --   --   --  2.6*  --   PHOS  --   --   --   --   --   --  8.0*  --    < > = values in this interval not displayed.   GFR: Estimated Creatinine Clearance: 21 mL/min (A) (by C-G formula based on SCr of 4.24 mg/dL (H)). Recent Labs  Lab 10/18/2019 1628 10/11/2019 1830 10/14/2019 2029 10/21/19 0544  WBC 23.9*  --  27.8* 20.7*  LATICACIDVEN  --  >11.0* 9.4*  --     Liver Function Tests: Recent Labs  Lab 10/17/2019 1628 10/23/2019 2029  AST 931* 1,550*  ALT 971* 1,234*  ALKPHOS 107 100  BILITOT 0.7 0.8  PROT 7.1 5.8*  ALBUMIN 3.6 2.8*   No results for input(s): LIPASE, AMYLASE in the last 168 hours. Recent Labs  Lab 10/23/2019 2029 10/21/19 0500  AMMONIA 148* 71*    ABG    Component Value Date/Time   PHART 7.117 (LL) 10/21/2019 0352   PCO2ART 45.7 10/21/2019 0352   PO2ART 99 10/21/2019 0352   HCO3 14.8 (L) 10/21/2019 0352   TCO2 16 (L) 10/21/2019 0352   ACIDBASEDEF 14.0 (H) 10/21/2019 0352   O2SAT  95.0 10/21/2019 0352     Coagulation Profile: Recent Labs  Lab 10/18/2019 1628 10/23/2019 2029 10/21/19 0544  INR 2.3* 2.0* 2.4*    Cardiac Enzymes: No results for input(s): CKTOTAL, CKMB, CKMBINDEX, TROPONINI in the last 168 hours.  HbA1C: Hgb A1c MFr Bld  Date/Time Value Ref Range Status  10/02/2019 02:36 PM 5.7 (H) <5.7 % of total Hgb Final    Comment:    For someone without known diabetes, a hemoglobin  A1c value between 5.7% and 6.4% is consistent with prediabetes and should be confirmed with a  follow-up test. . For someone with known diabetes, a value <7% indicates that their diabetes is well controlled. A1c targets should be individualized based on duration of diabetes, age, comorbid conditions, and other considerations. . This assay result is consistent with an increased risk of diabetes. . Currently, no consensus exists regarding use of hemoglobin A1c for diagnosis of diabetes for children. [name]   03/22/2019 02:16 PM 6.1 4.6 - 6.5 % Final    Comment:    Glycemic Control Guidelines for People with Diabetes:Non Diabetic:  <6%Goal of Therapy: <7%Additional Action Suggested:  >8%     CBG: Recent Labs  Lab 10/13/2019 1628 10/17/2019 2008 10/26/2019 2311 10/21/19 0313 10/21/19 0746  GLUCAP 247* 169* 135* 122* 111*    Past Medical History  He,  has a past medical history of Arthritis, Asthma, BPH (benign prostatic hypertrophy), CAD (coronary artery disease), Complication of anesthesia, COPD (chronic obstructive pulmonary disease) ([place]), Diabetes mellitus without complication ([place]), Diverticulosis, DVT (deep venous thrombosis) ([place]), Dyspnea on exertion, GERD (gastroesophageal reflux disease), History of gout, History of hiatal hernia, echocardiogram (2015), Hypertension, Macrocytic anemia, Obesity, OSA (obstructive sleep apnea) (11/14/2015), PAF (paroxysmal atrial fibrillation) ([place]), Pneumonia (~ 2010 X 1), Pulmonary embolism ([place]), Sleep apnea, Spinal stenosis,  Transaminitis, and Ulcerative proctitis ([place]) (08/25/2011).   Surgical History    Past Surgical History:  Procedure Laterality Date  . ANTERIOR LUMBAR [place] ARTHROPLASTY  03/2008   "  spacer poped out; had to repair"  . CARDIOVERSION N/A 04/02/2019   Procedure: CARDIOVERSION;  Surgeon: Deboraha Sprang, MD;  Location: [facility] OR;  Service: Cardiovascular;  Laterality: N/A;  . CATARACT EXTRACTION W/ INTRAOCULAR LENS  IMPLANT, BILATERAL Bilateral   . COLONOSCOPY    . COLONOSCOPY W/ POLYPECTOMY  2004  . Colonoscopy with polypectomy  09/2011   2 tubular adenomas  . CORONARY ANGIOPLASTY WITH STENT PLACEMENT  08/08/2013   "1"  . JOINT REPLACEMENT    . LEFT AND RIGHT HEART CATHETERIZATION WITH CORONARY ANGIOGRAM N/A 08/07/2013   Procedure: LEFT AND RIGHT HEART CATHETERIZATION WITH CORONARY ANGIOGRAM;  Surgeon: Peter M Martinique, MD;  Location: [facility] CATH LAB;  Service: Cardiovascular;  Laterality: N/A;  . LUMBAR FUSION  02/2008  . PERCUTANEOUS CORONARY STENT INTERVENTION (PCI-S)  08/07/2013   Procedure: PERCUTANEOUS CORONARY STENT INTERVENTION (PCI-S);  Surgeon: Peter M Martinique, MD;  Location: [facility] CATH LAB;  Service: Cardiovascular;;  . PVI ablation  11/2014   Dr. Venita Sheffield [facility]  . RIGHT/LEFT HEART CATH AND CORONARY ANGIOGRAPHY N/A 08/15/2018   Procedure: RIGHT/LEFT HEART CATH AND CORONARY ANGIOGRAPHY;  Surgeon: Nelva Bush, MD;  Location: [facility] CV LAB;  Service: Cardiovascular;  Laterality: N/A;  . TOTAL HIP ARTHROPLASTY Bilateral   . VASECTOMY       Social History   reports that he quit smoking about 19 years ago. His smoking use included cigarettes. He has a 80.00 pack-year smoking history. He has never used smokeless tobacco. He reports current alcohol use of about 2.0 - 3.0 standard drinks of alcohol per week. He reports that he does not use drugs.   Family History   His family history includes Alcohol abuse (age of onset: 61) in his brother; Atrial fibrillation in his mother; COPD in  his mother; Colon cancer in his father; Heart attack in his paternal grandfather and paternal uncle; Heart attack (age of onset: 2) in his brother; Heart disease in his brother and father. There is no history of Stroke, Rectal cancer, Stomach cancer, or Esophageal cancer.   Allergies Allergies  Allergen Reactions  . Penicillins Anaphylaxis    Did it involve swelling of the face/tongue/throat, SOB, or low BP? Yes Did it involve sudden or severe rash/hives, skin peeling, or any reaction on the inside of your mouth or nose? Yes Did you need to seek medical attention at a hospital or doctor's office? Yes When did it last happen?30 + years If all above answers are "NO", may proceed with cephalosporin use.      Home Medications  Prior to Admission medications   Medication Sig Start Date End Date Taking? Authorizing Provider  albuterol (VENTOLIN HFA) 108 (90 Base) MCG/ACT inhaler Inhale 2 puffs into the lungs every 6 (six) hours as needed for wheezing or shortness of breath.   Yes [provider]  allopurinol (ZYLOPRIM) 100 MG tablet Take 1 tablet (100 mg total) by mouth daily. Patient taking differently: Take 100 mg by mouth daily as needed (Gout).  06/16/19  Yes Burns, Claudina Lick, MD  aspirin 81 MG chewable tablet Chew 81 mg by mouth daily.  11/18/14  Yes [provider]  atorvastatin (LIPITOR) 10 MG tablet TAKE 1 TABLET EVERY EVENING Patient taking differently: Take 10 mg by mouth at bedtime.  05/23/19  Yes Minus Breeding, MD  Cholecalciferol (VITAMIN D-3) 25 MCG (1000 UT) CAPS Take 2,000 Units by mouth daily.    Yes [provider]  eszopiclone (LUNESTA) 2 MG TABS  tablet Take 2 mg by mouth at bedtime as needed for sleep.  10/11/19  Yes [provider]  Fluticasone-Umeclidin-Vilant (TRELEGY ELLIPTA) 100-62.5-25 MCG/INH AEPB Inhale 1 puff into the lungs daily.   Yes [provider]  LORazepam (ATIVAN) 0.5 MG tablet Take 1 tablet (0.5 mg total) by mouth  at bedtime as needed for anxiety or sleep. 10/06/19  Yes Burns, Claudina Lick, MD  metFORMIN (GLUCOPHAGE) 500 MG tablet TAKE 1 TABLET TWICE A DAY WITH MEALS Patient taking differently: Take 500 mg by mouth 2 (two) times daily with a meal.  09/18/19  Yes Burns, Claudina Lick, MD  metoprolol tartrate (LOPRESSOR) 25 MG tablet Take 1 tablet (25 mg total) by mouth 2 (two) times daily. 04/14/19  Yes Minus Breeding, MD  nitroGLYCERIN (NITROSTAT) 0.4 MG SL tablet Place 1 tablet (0.4 mg total) under the tongue every 5 (five) minutes as needed for chest pain (up to 3 doses). 01/09/15  Yes Minus Breeding, MD  oxyCODONE-acetaminophen (PERCOCET) 10-325 MG tablet Take 1 tablet by mouth 3 (three) times daily as needed. 10/03/19  Yes [provider]  pantoprazole (PROTONIX) 40 MG tablet TAKE 1 TABLET DAILY Patient taking differently: Take 40 mg by mouth daily as needed (Heartburn).  02/03/19  Yes Minus Breeding, MD  venlafaxine XR (EFFEXOR-XR) 75 MG 24 hr capsule TAKE 1 CAPSULE DAILY WITH BREAKFAST Patient taking differently: Take 75 mg by mouth daily with breakfast.  09/08/19  Yes Burns, Claudina Lick, MD  verapamil (CALAN-SR) 120 MG CR tablet Take 1 tablet (120 mg total) by mouth at bedtime. 04/14/19  Yes Minus Breeding, MD  vitamin B-12 (CYANOCOBALAMIN) 1000 MCG tablet Take 1,000 mcg by mouth daily.   Yes [provider]  warfarin (COUMADIN) 3 MG tablet Take 1-2 tablets (3-6 mg total) by mouth See admin instructions. 63m everyday except on Friday take 373m1/24/21  Yes Weaver, ScNicki Reaper, PA-C  Cholecalciferol (VITAMIN D3 PO) Take 1 capsule by mouth daily. Patient not taking: Reported on 10/09/2019    [provider]  metoprolol tartrate (LOPRESSOR) 50 MG tablet Take 2 tablets by mouth 2 (two) times daily. Patient not taking: Reported on 10/25/2019 10/10/18   [provider]     Critical care time: 50 min

## 2019-10-21 NOTE — Progress Notes (Addendum)
Pharmacy Antibiotic Note  [name] [name] is a 74 y.o. male admitted on 11/05/2019 with sepsis.  Pharmacy has been consulted for cefepime dosing.  WBC 20.7, afebrile, continues to be hypotensive on norepi and vasopressin. MRSA negative. Renal function declining rapidly. Unclear source. Noted anaphylaxis to PCNs, tolerated cefepime 8/13.  Plan: Cefepime 2g Q24 hr  Monitor cultures, clinical status, renal fx Narrow abx as able and f/u duration    Height: 6\' 2"  (188 cm) Weight: 123.4 kg (272 lb 1.6 oz) IBW/kg (Calculated) : 82.2  Temp (24hrs), Avg:97.5 F (36.4 C), Min:94.7 F (34.8 C), Max:99.9 F (37.7 C)  Recent Labs  Lab 10/21/2019 1628 11/06/2019 1830 10/21/2019 2029 10/21/19 0113 10/21/19 0544 10/21/19 0600  WBC 23.9*  --  27.8*  --  20.7*  --   CREATININE 3.31*  --  3.21* 3.42*  --  4.24*  LATICACIDVEN  --  >11.0* 9.4*  --   --   --     Estimated Creatinine Clearance: 21 mL/min (A) (by C-G formula based on SCr of 4.24 mg/dL (H)).    Allergies  Allergen Reactions  . Penicillins Anaphylaxis    Did it involve swelling of the face/tongue/throat, SOB, or low BP? Yes Did it involve sudden or severe rash/hives, skin peeling, or any reaction on the inside of your mouth or nose? Yes Did you need to seek medical attention at a hospital or doctor's office? Yes When did it last happen?30 + years If all above answers are "NO", may proceed with cephalosporin use.     Antimicrobials this admission: Cefepime 8/13 >>  Vanc 8/13 x1   Microbiology results: 8/13 BCx: pend 8/13 UCx: pend  8/13 Sputum: pend  8/13 MRSA PCR: neg   Thank you for allowing pharmacy to be a part of this patient's care.   Benetta Spar, PharmD, BCPS, BCCP Clinical Pharmacist  Please check AMION for all [place] phone numbers After 10:00 PM, call [place] [phone]

## 2019-10-21 NOTE — Progress Notes (Signed)
PHARMACY NOTE:  ANTIMICROBIAL RENAL DOSAGE ADJUSTMENT  Current antimicrobial regimen includes a mismatch between antimicrobial dosage and estimated renal function.  As per policy approved by the Pharmacy & Therapeutics and Medical Executive Committees, the antimicrobial dosage will be adjusted accordingly.  Current antimicrobial dosage:  Cefepime 2g IV every 24 hours   Indication: Sepsis  Renal Function:  Estimated Creatinine Clearance: 21 mL/min (A) (by C-G formula based on SCr of 4.24 mg/dL (H)). []      On intermittent HD, scheduled: [x]      On CRRT    Antimicrobial dosage has been changed to:  Cefepime 2g IV every 12 hours   Additional comments:   Thank you for allowing pharmacy to be a part of this patient's care.  Antonietta Jewel, PharmD, [place] Clinical Pharmacist  Phone: [phone] 10/21/2019 8:59 PM  Please check AMION for all [place] phone numbers After 10:00 PM, call [place] [phone]

## 2019-10-21 NOTE — Consult Note (Addendum)
Renal Service Consult Note [place] 10/21/2019 Sol Blazing Requesting Physician:  Dr Apolinar Junes, A.   Reason for Consult:  Renal failure HPI: The patient is a 74 y.o. year-old w/ hx of OSA, PE, PAF, obesity, HTN, gout, DVT, DM2, COPD, CAD w/ stenting, BPH who presented unresponsive on 8/13, EMS called and found pt w/ low HR 40's. He was intubated in the ED. BP's were in the 60's and levo gtt was started. ED labs showed LA > 11, WBC 23k, Hb 10, acetaminophen level 89, ^ALT/ AST, INR 2,  K 6.5 and creat 3.21 (baseline 0.9- 1.1 this year). Admitted to ICU and given IV abx w/ vanc/ cefepime and IVF's. AMS felt due to septic shock and ^'d tylenol level. Was treated w/ mucomyst x 24 hrs. Trop 2681 today, started on IV Hep. ECHO pending. Creat increased to 4 and acidosis not correcting, we are asked to see for renal failure.   Pt is on vent and sedated, wife at bedside. No hx of kidney disease. Not able to get hx from pt.     ROS n/a  Past Medical History  Past Medical History:  Diagnosis Date  . Arthritis   . Asthma   . BPH (benign prostatic hypertrophy)   . CAD (coronary artery disease)    a. 07/2013: s/p DES to LAD, normal LVF.  [name] Complication of anesthesia    "I got all kinds of hallucinations"  . COPD (chronic obstructive pulmonary disease) ([place])    a. 07/2013 PFT's mild airflow obstruction, no restriction, sev decrease in DLCO.  . Diabetes mellitus without complication ([place])   . Diverticulosis   . DVT (deep venous thrombosis) ([place])    a. 2010 Lower ext s/p back surgery.  [name] Dyspnea on exertion    a. 07/2013 PFT's mild airflow obstr   . GERD (gastroesophageal reflux disease)   . History of gout   . History of hiatal hernia   . Hx of echocardiogram 2015   Echo (06/2013): EF 60-65% normal wall motion, normal diastolic function, aortic sclerosis without stenosis, Trivial MR, mild SAM due to long, redundant mitral leaflets, mild RAE, normal RVSF  .  Hypertension   . Macrocytic anemia    a. 07/2013: documented on prior labs.  . Obesity   . OSA (obstructive sleep apnea) 11/14/2015  . PAF (paroxysmal atrial fibrillation) ([place])    a. Flecainide discontinued 07/2013 in setting of CAD.; b. s/p PVI Ablation at [facility] (Dr Joseph Berkshire) 11/2014  . Pneumonia ~ 2010 X 1  . Pulmonary embolism ([place])    a. 2010 in setting of DVT post-op back surgery. b. Low prob VQ 07/2013.  [name] Sleep apnea    on cpap  . Spinal stenosis    Congential  . Transaminitis    a. 07/2013: mild.  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 ANTERIOR LUMBAR [place] ARTHROPLASTY  03/2008   "spacer poped out; had to repair"  . CARDIOVERSION N/A 04/02/2019   Procedure: CARDIOVERSION;  Surgeon: Deboraha Sprang, MD;  [facility] OR;  Service: Cardiovascular;  Laterality: N/A;  . CATARACT EXTRACTION W/ INTRAOCULAR LENS  IMPLANT, BILATERAL Bilateral   . COLONOSCOPY    . COLONOSCOPY W/ POLYPECTOMY  2004  . Colonoscopy with polypectomy  09/2011   2 tubular adenomas  . CORONARY ANGIOPLASTY WITH STENT PLACEMENT  08/08/2013   "1"  . JOINT REPLACEMENT    .  LEFT AND RIGHT HEART CATHETERIZATION WITH CORONARY ANGIOGRAM N/A 08/07/2013   Procedure: LEFT AND RIGHT HEART CATHETERIZATION WITH CORONARY ANGIOGRAM;  Surgeon: Peter M Martinique, MD;  Location: [facility] CATH LAB;  Service: Cardiovascular;  Laterality: N/A;  . LUMBAR FUSION  02/2008  . PERCUTANEOUS CORONARY STENT INTERVENTION (PCI-S)  08/07/2013   Procedure: PERCUTANEOUS CORONARY STENT INTERVENTION (PCI-S);  Surgeon: Peter M Martinique, MD;  Location: [facility] CATH LAB;  Service: Cardiovascular;;  . PVI ablation  11/2014   Dr. Venita Sheffield [facility]  . RIGHT/LEFT HEART CATH AND CORONARY ANGIOGRAPHY N/A 08/15/2018   Procedure: RIGHT/LEFT HEART CATH AND CORONARY ANGIOGRAPHY;  Surgeon: Nelva Bush, MD;  Location: [place] CV LAB;  Service: Cardiovascular;  Laterality: N/A;   . TOTAL HIP ARTHROPLASTY Bilateral   . VASECTOMY     Family History  Family History  Problem Relation Age of Onset  . Heart attack Paternal Grandfather        Over 59  . Atrial fibrillation Mother   . COPD Mother   . Heart disease Father   . Colon cancer Father   . Heart attack Brother 54  . Heart disease Brother   . Alcohol abuse Brother 25       Died post liver transplant  . Heart attack Paternal Uncle        Less than 35  . Stroke Neg Hx   . Rectal cancer Neg Hx   . Stomach cancer Neg Hx   . Esophageal cancer Neg Hx    Social History  reports that he quit smoking about 19 years ago. His smoking use included cigarettes. He has a 80.00 pack-year smoking history. He has never used smokeless tobacco. He reports current alcohol use of about 2.0 - 3.0 standard drinks of alcohol per week. He reports that he does not use drugs. Allergies  Allergies  Allergen Reactions  . Penicillins Anaphylaxis    Did it involve swelling of the face/tongue/throat, SOB, or low BP? Yes Did it involve sudden or severe rash/hives, skin peeling, or any reaction on the inside of your mouth or nose? Yes Did you need to seek medical attention at a hospital or doctor's office? Yes When did it last happen?30 + years If all above answers are "NO", may proceed with cephalosporin use.    Home medications Prior to Admission medications   Medication Sig Start Date End Date Taking? Authorizing Provider  albuterol (VENTOLIN HFA) 108 (90 Base) MCG/ACT inhaler Inhale 2 puffs into the lungs every 6 (six) hours as needed for wheezing or shortness of breath.   Yes [provider]  allopurinol (ZYLOPRIM) 100 MG tablet Take 1 tablet (100 mg total) by mouth daily. Patient taking differently: Take 100 mg by mouth daily as needed (Gout).  06/16/19  Yes Burns, Claudina Lick, MD  aspirin 81 MG chewable tablet Chew 81 mg by mouth daily.  11/18/14  Yes [provider]  atorvastatin (LIPITOR) 10 MG tablet TAKE  1 TABLET EVERY EVENING Patient taking differently: Take 10 mg by mouth at bedtime.  05/23/19  Yes Minus Breeding, MD  Cholecalciferol (VITAMIN D-3) 25 MCG (1000 UT) CAPS Take 2,000 Units by mouth daily.    Yes [provider]  eszopiclone (LUNESTA) 2 MG TABS tablet Take 2 mg by mouth at bedtime as needed for sleep.  10/11/19  Yes [provider]  Fluticasone-Umeclidin-Vilant (TRELEGY ELLIPTA) 100-62.5-25 MCG/INH AEPB Inhale 1 puff into the lungs daily.   Yes [provider]  LORazepam (ATIVAN)  0.5 MG tablet Take 1 tablet (0.5 mg total) by mouth at bedtime as needed for anxiety or sleep. 10/06/19  Yes Burns, Claudina Lick, MD  metFORMIN (GLUCOPHAGE) 500 MG tablet TAKE 1 TABLET TWICE A DAY WITH MEALS Patient taking differently: Take 500 mg by mouth 2 (two) times daily with a meal.  09/18/19  Yes Burns, Claudina Lick, MD  metoprolol tartrate (LOPRESSOR) 25 MG tablet Take 1 tablet (25 mg total) by mouth 2 (two) times daily. 04/14/19  Yes Minus Breeding, MD  nitroGLYCERIN (NITROSTAT) 0.4 MG SL tablet Place 1 tablet (0.4 mg total) under the tongue every 5 (five) minutes as needed for chest pain (up to 3 doses). 01/09/15  Yes Minus Breeding, MD  oxyCODONE-acetaminophen (PERCOCET) 10-325 MG tablet Take 1 tablet by mouth 3 (three) times daily as needed. 10/03/19  Yes [provider]  pantoprazole (PROTONIX) 40 MG tablet TAKE 1 TABLET DAILY Patient taking differently: Take 40 mg by mouth daily as needed (Heartburn).  02/03/19  Yes Minus Breeding, MD  venlafaxine XR (EFFEXOR-XR) 75 MG 24 hr capsule TAKE 1 CAPSULE DAILY WITH BREAKFAST Patient taking differently: Take 75 mg by mouth daily with breakfast.  09/08/19  Yes Burns, Claudina Lick, MD  verapamil (CALAN-SR) 120 MG CR tablet Take 1 tablet (120 mg total) by mouth at bedtime. 04/14/19  Yes Minus Breeding, MD  vitamin B-12 (CYANOCOBALAMIN) 1000 MCG tablet Take 1,000 mcg by mouth daily.   Yes [provider]  warfarin (COUMADIN) 3 MG tablet  Take 1-2 tablets (3-6 mg total) by mouth See admin instructions. 6mg  everyday except on Friday take 3mg  04/02/19  Yes Weaver, Nicki Reaper T, PA-C  Cholecalciferol (VITAMIN D3 PO) Take 1 capsule by mouth daily. Patient not taking: Reported on 11/07/2019    [provider]  metoprolol tartrate (LOPRESSOR) 50 MG tablet Take 2 tablets by mouth 2 (two) times daily. Patient not taking: Reported on 10/24/2019 10/10/18   [provider]     Vitals:   10/21/19 1400 10/21/19 1500 10/21/19 1537 10/21/19 1600  BP: (!) 138/51 (!) 134/33 (!) 86/40 (!) 96/53  Pulse: 97 97  96  Resp: (!) 26 (!) 24  (!) 21  Temp: (!) 100.9 F (38.3 C) (!) 101.1 F (38.4 C)  (!) 101.3 F (38.5 C)  TempSrc:    Bladder  SpO2: 96% 98%  95%  Weight:      Height:       Exam Gen on vent, sedated, opens eyes and moves arms w/ purposeful movement, not following commands No rash, cyanosis or gangrene Sclera anicteric, throat w ETT  No jvd or bruits Chest clear bilat no rales or wheezing RRR no MRG Abd soft ntnd no mass or ascites +bs GU normal male w/ foley draining small amts clear yellow urine MS no joint effusions or deformity Ext 1+ bilat UE edema, no other edema, toes are dusky on both foot and cool Neuro is sedated on vent    Home meds:  - asa 81/ lipitor 10mg / metoprolol 25 bid/ sl ntg prn/ verapamil 120 sr  - coumadin 3- 6mg  per day  - effexor xr 75 qd/ ativan prn sleep  - allopurinol 100 qd prn/ protonix 40 qd  - metformin 500 bid ac  - Trelegy ellipta 1 puff qd  - prn's/ vitamins/ supplements   UA 8/13 - cloudy, 0-5 rbc/ wbc, neg protein  UNa, UCr pend   Renal US pend   CXR 8/13 -  IMPRESSION: 1. Endotracheal tube above the  carina. 2. Faint bilateral mid to lower lung field subpleural densities may represent atelectasis or atypical infection.    CT abd noncon 8/13 > Adrenals/Urinary Tract: Both adrenal glands appear normal. There is a 3.9 cm low-density lesion seen in the lower pole the right  kidney. Bladder is unremarkable. (kidneys a bit small to the eye).   Lungs:  Multifocal tree-in-bud and patchy airspace opacities seen throughout the right lung which could be due to infectious or inflammatory process. Baseline creat from 2009 - October 02, 2019 is 0.9- 1.1.     Assessment/ Plan: 1. AKI - acute ischemic ATN due to shock / hypoperfusion.  Oliguric. Complicated by refractory metabolic acidosis and hyperkalemia, ^B/Cr. Get [place] and urine lytes. Would recommend CRRT, have d/w primary team. No heparin w/ Everlean Alstrom. Keep even. See orders.   2. Volume - min edema, no sig vol excess 3. Shock - suspected PNA, aspiration vs other, on pressors and IV abx 4. Tylenol toxicity - sp mucomyst 5. ^LFT's / INR- improving 6. VDRF - per CCM 7. AMS- suspected d/t sepsis/ PNA 8. Hx CAD/ HTN/ atrial fib - ^^trop, echo pending, holding coumadin w/ Jeannie Done 9. DM2 10. Hx COPD      Rob Makaylyn Sinyard  MD 10/21/2019, 6:07 PM  Recent Labs  Lab 10/25/2019 2029 11/01/2019 2256 10/21/19 0544 10/21/19 0544 10/21/19 1007 10/21/19 1533  WBC 27.8*  --  20.7*  --   --   --   HGB 10.7*   < > 10.3*   < > 10.9* 10.2*   < > = values in this interval not displayed.   Recent Labs  Lab 10/21/19 0113 10/21/19 0352 10/21/19 0544 10/21/19 0600 10/21/19 0600 10/21/19 1007 10/21/19 1533  K 5.9*   < >  --  7.2*   < > 6.8* 6.1*  BUN 28*  --   --  34*  --   --   --   CREATININE 3.42*  --   --  4.24*  --   --   --   CALCIUM 6.7*  --   --  6.5*  --   --   --   PHOS  --   --  8.0*  --   --   --   --    < > = values in this interval not displayed.

## 2019-10-21 NOTE — Consult Note (Signed)
Referring Physician: Critical care service  Patient name: [name] [name] MRN: [id number] DOB: 1945/08/08 Sex: male  REASON FOR CONSULT: Bilateral foot ischemia  HPI: [name] [name] is a 74 y.o. male, admitted for altered mental status possible Tylenol toxicity currently on Levophed and vasopressin.  He was noted to have dusky feet today on exam.  They were unable to find Doppler signals.  Patient's intubated and sedated on the ventilator.  History was provided by the patient's wife.  She states he has not complained of problems in his legs in the past.  She states he has been compliant with his warfarin for his atrial fibrillation up until 5 days ago.  He was scheduled to have a spine injection for chronic back pain and the warfarin was stopped for this.  He has never describe claudication or rest pain symptoms.  He has actually been seen in our office in the past for a small abdominal aortic aneurysm.  He was last seen November 2020 aneurysm diameter was 3.3 cm at that time.  Exam at that time showed palpable posterior tibial and dorsalis pedis pulses.  Current INR is 2.2.  Other medical problems include arthritis, asthma, diabetes, gout, hypertension all of which are been stable.  He did have a pulmonary embolus in the past in the setting of a postoperative.[name]  He also has sleep apnea and is on CPAP.  Past Medical History:  Diagnosis Date  . Arthritis   . Asthma   . BPH (benign prostatic hypertrophy)   . CAD (coronary artery disease)    a. 07/2013: s/p DES to LAD, normal LVF.  [name] Complication of anesthesia    "I got all kinds of hallucinations"  . COPD (chronic obstructive pulmonary disease) ([place])    a. 07/2013 PFT's mild airflow obstruction, no restriction, sev decrease in DLCO.  . Diabetes mellitus without complication ([place])   . Diverticulosis   . DVT (deep venous thrombosis) ([place])    a. 2010 Lower ext s/p back surgery.  [name] Dyspnea on exertion    a. 07/2013 PFT's mild airflow obstr   .  GERD (gastroesophageal reflux disease)   . History of gout   . History of hiatal hernia   . Hx of echocardiogram 2015   Echo (06/2013): EF 60-65% normal wall motion, normal diastolic function, aortic sclerosis without stenosis, Trivial MR, mild SAM due to long, redundant mitral leaflets, mild RAE, normal RVSF  . Hypertension   . Macrocytic anemia    a. 07/2013: documented on prior labs.  . Obesity   . OSA (obstructive sleep apnea) 11/14/2015  . PAF (paroxysmal atrial fibrillation) ([facility])    a. Flecainide discontinued 07/2013 in setting of CAD.; b. s/p PVI Ablation at [facility] (Dr Joseph Berkshire) 11/2014  . Pneumonia ~ 2010 X 1  . Pulmonary embolism ([place])    a. 2010 in setting of DVT post-op back surgery. b. Low prob VQ 07/2013.  [name] Sleep apnea    on cpap  . Spinal stenosis    Congential  . Transaminitis    a. 07/2013: mild.  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 ANTERIOR LUMBAR [place] ARTHROPLASTY  03/2008   "spacer poped out; had to repair"  . CARDIOVERSION N/A 04/02/2019   Procedure: CARDIOVERSION;  Surgeon: Deboraha Sprang, MD;  Location: [facility] OR;  Service: Cardiovascular;  Laterality: N/A;  . CATARACT EXTRACTION W/ INTRAOCULAR LENS  IMPLANT, BILATERAL Bilateral   . COLONOSCOPY    .  COLONOSCOPY W/ POLYPECTOMY  2004  . Colonoscopy with polypectomy  09/2011   2 tubular adenomas  . CORONARY ANGIOPLASTY WITH STENT PLACEMENT  08/08/2013   "1"  . JOINT REPLACEMENT    . LEFT AND RIGHT HEART CATHETERIZATION WITH CORONARY ANGIOGRAM N/A 08/07/2013   Procedure: LEFT AND RIGHT HEART CATHETERIZATION WITH CORONARY ANGIOGRAM;  Surgeon: Peter M Martinique, MD;  Location: [facility] CATH LAB;  Service: Cardiovascular;  Laterality: N/A;  . LUMBAR FUSION  02/2008  . PERCUTANEOUS CORONARY STENT INTERVENTION (PCI-S)  08/07/2013   Procedure: PERCUTANEOUS CORONARY STENT INTERVENTION (PCI-S);  Surgeon: Peter M Martinique, MD;  Location: [facility] CATH LAB;  Service: Cardiovascular;;  .  PVI ablation  11/2014   Dr. Venita Sheffield [facility]  . RIGHT/LEFT HEART CATH AND CORONARY ANGIOGRAPHY N/A 08/15/2018   Procedure: RIGHT/LEFT HEART CATH AND CORONARY ANGIOGRAPHY;  Surgeon: Nelva Bush, MD;  Location: [place] CV LAB;  Service: Cardiovascular;  Laterality: N/A;  . TOTAL HIP ARTHROPLASTY Bilateral   . VASECTOMY      Family History  Problem Relation Age of Onset  . Heart attack Paternal Grandfather        Over 56  . Atrial fibrillation Mother   . COPD Mother   . Heart disease Father   . Colon cancer Father   . Heart attack Brother 68  . Heart disease Brother   . Alcohol abuse Brother 40       Died post liver transplant  . Heart attack Paternal Uncle        Less than 19  . Stroke Neg Hx   . Rectal cancer Neg Hx   . Stomach cancer Neg Hx   . Esophageal cancer Neg Hx     SOCIAL HISTORY: Social History   Socioeconomic History  . Marital status: Married    Spouse name: Not on file  . Number of children: 4  . Years of education: Not on file  . Highest education level: Not on file  Occupational History  . Occupation: Retired from [employer]    Employer: RETIRED  Tobacco Use  . Smoking status: Former Smoker    Packs/day: 2.00    Years: 40.00    Pack years: 80.00    Types: Cigarettes    Quit date: 03/09/2000    Years since quitting: 19.6  . Smokeless tobacco: Never Used  . Tobacco comment: smoked 1964-2002, up to 3 ppd  Vaping Use  . Vaping Use: Never used  Substance and Sexual Activity  . Alcohol use: Yes    Alcohol/week: 2.0 - 3.0 standard drinks    Types: 2 - 3 Cans of beer per week    Comment: drinks beer several times weekly  . Drug use: No  . Sexual activity: Never    Comment: "sexually hx is none of your business" (02/08/2014)  Other Topics Concern  . Not on file  Social History Narrative  . Not on file   Social Determinants of Health   Financial Resource Strain:   . Difficulty of Paying Living Expenses:   Food Insecurity:   .  Worried About Charity fundraiser in the Last Year:   . Arboriculturist in the Last Year:   Transportation Needs:   . Film/video editor (Medical):   [name] Lack of Transportation (Non-Medical):   Physical Activity:   . Days of Exercise per Week:   . Minutes of Exercise per Session:   Stress:   . Feeling of Stress :  Social Connections:   . Frequency of Communication with Friends and Family:   . Frequency of Social Gatherings with Friends and Family:   . Attends Religious Services:   . Active Member of Clubs or Organizations:   . Attends Archivist Meetings:   [name] Marital Status:   Intimate Partner Violence:   . Fear of Current or Ex-Partner:   . Emotionally Abused:   [name] Physically Abused:   . Sexually Abused:     Allergies  Allergen Reactions  . Penicillins Anaphylaxis    Did it involve swelling of the face/tongue/throat, SOB, or low BP? Yes Did it involve sudden or severe rash/hives, skin peeling, or any reaction on the inside of your mouth or nose? Yes Did you need to seek medical attention at a hospital or doctor's office? Yes When did it last happen?30 + years If all above answers are "NO", may proceed with cephalosporin use.     Current Facility-Administered Medications  Medication Dose Route Frequency Provider Last Rate Last Admin  . 0.9 %  sodium chloride infusion  10 mL/hr Intravenous Once Gleason, Otilio Carpen, PA-C      . 0.9 %  sodium chloride infusion   Intra-arterial PRN Gleason, Otilio Carpen, PA-C      . 0.9 %  sodium chloride infusion   Intravenous PRN Jorje Guild, MD   Stopped at 10/21/19 1130  . acetylcysteine (ACETADOTE) 24,000 mg in dextrose 5 % 600 mL (40 mg/mL) infusion  15 mg/kg/hr (Adjusted) Intravenous Continuous Gleason, Otilio Carpen, PA-C 37 mL/hr at 10/21/19 1200 15 mg/kg/hr at 10/21/19 1200  . albuterol (PROVENTIL) (2.5 MG/3ML) 0.083% nebulizer solution 2.5 mg  2.5 mg Nebulization Q4H PRN Gleason, Otilio Carpen, PA-C      . ceFEPIme (MAXIPIME) 2 g  in sodium chloride 0.9 % 100 mL IVPB  2 g Intravenous Q24H Donnamae Jude, RPH      . chlorhexidine gluconate (MEDLINE KIT) (PERIDEX) 0.12 % solution 15 mL  15 mL Mouth Rinse BID Jorje Guild, MD   15 mL at 10/21/19 0800  . Chlorhexidine Gluconate Cloth 2 % PADS 6 each  6 each Topical Daily Kovacich, Estill Bamberg, MD      . dexmedetomidine (PRECEDEX) 400 MCG/100ML (4 mcg/mL) infusion  0.4-1.2 mcg/kg/hr Intravenous Titrated Jamesetta So, MD 12.34 mL/hr at 10/21/19 1228 0.4 mcg/kg/hr at 10/21/19 1228  . docusate (COLACE) 50 MG/5ML liquid 100 mg  100 mg Oral BID Gleason, Otilio Carpen, PA-C      . docusate sodium (COLACE) capsule 100 mg  100 mg Oral BID PRN Gleason, Otilio Carpen, PA-C      . insulin aspart (novoLOG) injection 0-15 Units  0-15 Units Subcutaneous Q4H Gleason, Otilio Carpen, PA-C   2 Units at 10/21/19 0420  . insulin aspart (novoLOG) injection 5 Units  5 Units Intravenous Once Gleason, Otilio Carpen, PA-C      . ipratropium-albuterol (DUONEB) 0.5-2.5 (3) MG/3ML nebulizer solution 3 mL  3 mL Nebulization Q6H Gleason, Otilio Carpen, PA-C   3 mL at 10/21/19 0752  . MEDLINE mouth rinse  15 mL Mouth Rinse 10 times per day Jorje Guild, MD   15 mL at 10/21/19 1115  . norepinephrine (LEVOPHED) 73m in 2517mpremix infusion  0-40 mcg/min Intravenous Continuous Gleason, LaOtilio CarpenPA-C 52.5 mL/hr at 10/21/19 1200 14 mcg/min at 10/21/19 1200  . pantoprazole (PROTONIX) injection 40 mg  40 mg Intravenous Daily Gleason, LaOtilio CarpenPA-C   40 mg at 10/21/19 1234  . polyethylene glycol (MIRALAX /  GLYCOLAX) packet 17 g  17 g Oral Daily Gleason, Otilio Carpen, PA-C      . polyethylene glycol (MIRALAX / GLYCOLAX) packet 17 g  17 g Oral Daily PRN Gleason, Otilio Carpen, PA-C      . sodium bicarbonate 150 mEq in dextrose 5% 1000 mL infusion  150 mEq Intravenous Continuous Donnamae Jude, RPH 75 mL/hr at 10/21/19 1200 Rate Verify at 10/21/19 1200  . sodium zirconium cyclosilicate (LOKELMA) packet 10 g  10 g Per Tube BID Donnamae Jude, [facility]      .  vasopressin (PITRESSIN) 20 Units in sodium chloride 0.9 % 100 mL infusion-*FOR SHOCK*  0-0.03 Units/min Intravenous Continuous Gleason, Otilio Carpen, PA-C 9 mL/hr at 10/21/19 1226 0.03 Units/min at 10/21/19 1226    ROS:   Unable to obtain patient sedated on ventilator  Physical Examination  Vitals:   10/21/19 1000 10/21/19 1100 10/21/19 1129 10/21/19 1200  BP: (!) 145/48 (!) 145/36 (!) 77/39 133/62  Pulse: 91 93  92  Resp: (!) 23 (!) 27 (!) 27 (!) 23  Temp: 100.2 F (37.9 C) (!) 100.6 F (38.1 C)  (!) 100.9 F (38.3 C)  TempSrc:      SpO2: 96% 95%  92%  Weight:      Height:        Body mass index is 34.94 kg/m.  General: Sedated HEENT: Normal endotracheal tube in place Neck: No JVD Cardiac: Regular Rate and Rhythm Abdomen: Soft, non-tender, non-distended, no mass, obese Skin: No rash, tips of toes are dusky bilaterally Extremity Pulses:  2+ radial, brachial, femoral, biphasic dorsalis pedis, posterior tibial pulses bilaterally Musculoskeletal: Trace pedal and ankle edema bilaterally Neurologic: Not moving extremities or following commands currently sedated  DATA:  CBC    Component Value Date/Time   WBC 20.7 (H) 10/21/2019 0544   RBC 3.03 (L) 10/21/2019 0544   HGB 10.9 (L) 10/21/2019 1007   HGB 11.8 (L) 08/09/2018 1257   HCT 32.0 (L) 10/21/2019 1007   HCT 34.7 (L) 08/09/2018 1257   PLT 383 10/21/2019 0544   PLT 460 (H) 08/09/2018 1257   MCV 110.2 (H) 10/21/2019 0544   MCV 104 (H) 08/09/2018 1257   MCH 34.0 10/21/2019 0544   MCHC 30.8 10/21/2019 0544   RDW 20.8 (H) 10/21/2019 0544   RDW 15.1 08/09/2018 1257   LYMPHSABS 4.3 (H) 10/19/2019 1628   MONOABS 2.2 (H) 10/10/2019 1628   EOSABS 0.0 10/18/2019 1628   BASOSABS 0.0 11/01/2019 1628   BMET    Component Value Date/Time   NA 138 10/21/2019 1007   NA 140 08/09/2018 1257   K 6.8 (HH) 10/21/2019 1007   CL 104 10/21/2019 0600   CO2 14 (L) 10/21/2019 0600   GLUCOSE 146 (H) 10/21/2019 0600   BUN 34 (H)  10/21/2019 0600   BUN 13 08/09/2018 1257   CREATININE 4.24 (H) 10/21/2019 0600   CREATININE 0.96 10/02/2019 1436   CALCIUM 6.5 (L) 10/21/2019 0600   GFRNONAA 13 (L) 10/21/2019 0600   GFRAA 15 (L) 10/21/2019 0600   INR 2.4  CT scan of the head shows no acute infarct or bleed  CT scan of the chest abdomen and pelvis without contrast possible right lung pneumonia, 3 x 3.6 cm diameter infrarenal abdominal aortic aneurysm no evidence of rupture  ASSESSMENT: Patient has dusky toe tips bilaterally.  Most likely this represents pressor effect.  He does not have a history of underlying significant peripheral arterial disease.  He does have atrial fibrillation  but his INR is currently therapeutic.  He does have easily palpable femoral pulses and I was able to easily find Doppler signals in his posterior tibial and dorsalis pedis bilaterally.  I suspect this is more related to pressor effect than any atheroembolic or underlying chronic peripheral arterial disease.   PLAN: Continue to follow vascular exam.  Wean pressors as tolerated.  When INR corrects with transition to IV heparin if clinically able   Ruta Hinds, MD [facility] Office: [phone]

## 2019-10-21 NOTE — Progress Notes (Signed)
CRITICAL VALUE ALERT  Critical Value:  Troponin 2681  Date & Time Notified. 6269 10/21/2019  Provider Notified: E-Link. PA Gleason as well.  Orders Received/Actions taken: None. Will monitor for new orders.

## 2019-10-22 DIAGNOSIS — J9601 Acute respiratory failure with hypoxia: Secondary | ICD-10-CM | POA: Diagnosis not present

## 2019-10-22 DIAGNOSIS — E872 Acidosis: Secondary | ICD-10-CM | POA: Diagnosis not present

## 2019-10-22 DIAGNOSIS — A419 Sepsis, unspecified organism: Secondary | ICD-10-CM | POA: Diagnosis not present

## 2019-10-22 DIAGNOSIS — R6521 Severe sepsis with septic shock: Secondary | ICD-10-CM | POA: Diagnosis not present

## 2019-10-22 DIAGNOSIS — N179 Acute kidney failure, unspecified: Secondary | ICD-10-CM

## 2019-10-22 LAB — RENAL FUNCTION PANEL
Albumin: 2.5 g/dL — ABNORMAL LOW (ref 3.5–5.0)
Albumin: 1.9 g/dL — ABNORMAL LOW (ref 3.5–5.0)
Anion gap: 31 — ABNORMAL HIGH (ref 5–15)
Anion gap: 36 — ABNORMAL HIGH (ref 5–15)
BUN: 42 mg/dL — ABNORMAL HIGH (ref 8–23)
BUN: 31 mg/dL — ABNORMAL HIGH (ref 8–23)
CO2: 13 mmol/L — ABNORMAL LOW (ref 22–32)
CO2: 16 mmol/L — ABNORMAL LOW (ref 22–32)
Calcium: 6.7 mg/dL — ABNORMAL LOW (ref 8.9–10.3)
Calcium: 5.9 mg/dL — CL (ref 8.9–10.3)
Chloride: 99 mmol/L (ref 98–111)
Chloride: 93 mmol/L — ABNORMAL LOW (ref 98–111)
Creatinine, Ser: 4.96 mg/dL — ABNORMAL HIGH (ref 0.61–1.24)
Creatinine, Ser: 4.04 mg/dL — ABNORMAL HIGH (ref 0.61–1.24)
GFR calc Af Amer: 12 mL/min — ABNORMAL LOW (ref >60)
GFR calc Af Amer: 16 mL/min — ABNORMAL LOW (ref >60)
GFR calc non Af Amer: 11 mL/min — ABNORMAL LOW (ref >60)
GFR calc non Af Amer: 14 mL/min — ABNORMAL LOW (ref >60)
Glucose, Bld: 108 mg/dL — ABNORMAL HIGH (ref 70–99)
Glucose, Bld: 80 mg/dL (ref 70–99)
Phosphorus: 7 mg/dL — ABNORMAL HIGH (ref 2.5–4.6)
Phosphorus: 7.4 mg/dL — ABNORMAL HIGH (ref 2.5–4.6)
Potassium: 5.8 mmol/L — ABNORMAL HIGH (ref 3.5–5.1)
Potassium: 5.3 mmol/L — ABNORMAL HIGH (ref 3.5–5.1)
Sodium: 143 mmol/L (ref 135–145)
Sodium: 145 mmol/L (ref 135–145)

## 2019-10-22 LAB — POCT I-STAT 7, (LYTES, BLD GAS, ICA,H+H)
Acid-base deficit: 13 mmol/L — ABNORMAL HIGH (ref 0.0–2.0)
Acid-base deficit: 13 mmol/L — ABNORMAL HIGH (ref 0.0–2.0)
Acid-base deficit: 10 mmol/L — ABNORMAL HIGH (ref 0.0–2.0)
Acid-base deficit: 9 mmol/L — ABNORMAL HIGH (ref 0.0–2.0)
Acid-base deficit: 10 mmol/L — ABNORMAL HIGH (ref 0.0–2.0)
Bicarbonate: 14.5 mmol/L — ABNORMAL LOW (ref 20.0–28.0)
Bicarbonate: 14.3 mmol/L — ABNORMAL LOW (ref 20.0–28.0)
Bicarbonate: 16.6 mmol/L — ABNORMAL LOW (ref 20.0–28.0)
Bicarbonate: 17.6 mmol/L — ABNORMAL LOW (ref 20.0–28.0)
Bicarbonate: 17.7 mmol/L — ABNORMAL LOW (ref 20.0–28.0)
Calcium, Ion: 0.83 mmol/L — CL (ref 1.15–1.40)
Calcium, Ion: 0.72 mmol/L — CL (ref 1.15–1.40)
Calcium, Ion: 0.74 mmol/L — CL (ref 1.15–1.40)
Calcium, Ion: 0.67 mmol/L — CL (ref 1.15–1.40)
Calcium, Ion: 0.71 mmol/L — CL (ref 1.15–1.40)
HCT: 29 % — ABNORMAL LOW (ref 39.0–52.0)
HCT: 27 % — ABNORMAL LOW (ref 39.0–52.0)
HCT: 28 % — ABNORMAL LOW (ref 39.0–52.0)
HCT: 24 % — ABNORMAL LOW (ref 39.0–52.0)
HCT: 24 % — ABNORMAL LOW (ref 39.0–52.0)
Hemoglobin: 9.9 g/dL — ABNORMAL LOW (ref 13.0–17.0)
Hemoglobin: 9.2 g/dL — ABNORMAL LOW (ref 13.0–17.0)
Hemoglobin: 9.5 g/dL — ABNORMAL LOW (ref 13.0–17.0)
Hemoglobin: 8.2 g/dL — ABNORMAL LOW (ref 13.0–17.0)
Hemoglobin: 8.2 g/dL — ABNORMAL LOW (ref 13.0–17.0)
O2 Saturation: 93 %
O2 Saturation: 98 %
O2 Saturation: 97 %
O2 Saturation: 99 %
O2 Saturation: 85 %
Patient temperature: 35.6
Patient temperature: 36.8
Patient temperature: 36.8
Potassium: 5.3 mmol/L — ABNORMAL HIGH (ref 3.5–5.1)
Potassium: 6.6 mmol/L (ref 3.5–5.1)
Potassium: 5.2 mmol/L — ABNORMAL HIGH (ref 3.5–5.1)
Potassium: 5.2 mmol/L — ABNORMAL HIGH (ref 3.5–5.1)
Potassium: 5 mmol/L (ref 3.5–5.1)
Sodium: 138 mmol/L (ref 135–145)
Sodium: 136 mmol/L (ref 135–145)
Sodium: 138 mmol/L (ref 135–145)
Sodium: 139 mmol/L (ref 135–145)
Sodium: 139 mmol/L (ref 135–145)
TCO2: 16 mmol/L — ABNORMAL LOW (ref 22–32)
TCO2: 16 mmol/L — ABNORMAL LOW (ref 22–32)
TCO2: 18 mmol/L — ABNORMAL LOW (ref 22–32)
TCO2: 19 mmol/L — ABNORMAL LOW (ref 22–32)
TCO2: 19 mmol/L — ABNORMAL LOW (ref 22–32)
pCO2 arterial: 36.5 mmHg (ref 32.0–48.0)
pCO2 arterial: 40 mmHg (ref 32.0–48.0)
pCO2 arterial: 40.6 mmHg (ref 32.0–48.0)
pCO2 arterial: 37.8 mmHg (ref 32.0–48.0)
pCO2 arterial: 47.3 mmHg (ref 32.0–48.0)
pH, Arterial: 7.198 — CL (ref 7.350–7.450)
pH, Arterial: 7.161 — CL (ref 7.350–7.450)
pH, Arterial: 7.219 — ABNORMAL LOW (ref 7.350–7.450)
pH, Arterial: 7.274 — ABNORMAL LOW (ref 7.350–7.450)
pH, Arterial: 7.181 — CL (ref 7.350–7.450)
pO2, Arterial: 76 mmHg — ABNORMAL LOW (ref 83.0–108.0)
pO2, Arterial: 122 mmHg — ABNORMAL HIGH (ref 83.0–108.0)
pO2, Arterial: 108 mmHg (ref 83.0–108.0)
pO2, Arterial: 130 mmHg — ABNORMAL HIGH (ref 83.0–108.0)
pO2, Arterial: 61 mmHg — ABNORMAL LOW (ref 83.0–108.0)

## 2019-10-22 LAB — URINE CULTURE
Culture: NO GROWTH
Special Requests: NORMAL

## 2019-10-22 LAB — GLUCOSE, CAPILLARY
Glucose-Capillary: 100 mg/dL — ABNORMAL HIGH (ref 70–99)
Glucose-Capillary: 116 mg/dL — ABNORMAL HIGH (ref 70–99)
Glucose-Capillary: 119 mg/dL — ABNORMAL HIGH (ref 70–99)
Glucose-Capillary: 68 mg/dL — ABNORMAL LOW (ref 70–99)
Glucose-Capillary: 74 mg/dL (ref 70–99)
Glucose-Capillary: 88 mg/dL (ref 70–99)

## 2019-10-22 LAB — SODIUM, URINE, RANDOM: Sodium, Ur: 61 mmol/L

## 2019-10-22 LAB — HEPATIC FUNCTION PANEL
ALT: 1706 U/L — ABNORMAL HIGH (ref 0–44)
AST: 2011 U/L — ABNORMAL HIGH (ref 15–41)
Albumin: 2.5 g/dL — ABNORMAL LOW (ref 3.5–5.0)
Alkaline Phosphatase: 65 U/L (ref 38–126)
Bilirubin, Direct: 0.3 mg/dL — ABNORMAL HIGH (ref 0.0–0.2)
Indirect Bilirubin: 1 mg/dL — ABNORMAL HIGH (ref 0.3–0.9)
Total Bilirubin: 1.3 mg/dL — ABNORMAL HIGH (ref 0.3–1.2)
Total Protein: 5.2 g/dL — ABNORMAL LOW (ref 6.5–8.1)

## 2019-10-22 LAB — PROTIME-INR
INR: 3 — ABNORMAL HIGH (ref 0.8–1.2)
Prothrombin Time: 29.9 seconds — ABNORMAL HIGH (ref 11.4–15.2)

## 2019-10-22 LAB — CBC
HCT: 31.2 % — ABNORMAL LOW (ref 39.0–52.0)
Hemoglobin: 9.8 g/dL — ABNORMAL LOW (ref 13.0–17.0)
MCH: 34.1 pg — ABNORMAL HIGH (ref 26.0–34.0)
MCHC: 31.4 g/dL (ref 30.0–36.0)
MCV: 108.7 fL — ABNORMAL HIGH (ref 80.0–100.0)
Platelets: 288 10*3/uL (ref 150–400)
RBC: 2.87 MIL/uL — ABNORMAL LOW (ref 4.22–5.81)
RDW: 20.6 % — ABNORMAL HIGH (ref 11.5–15.5)
WBC: 14.7 10*3/uL — ABNORMAL HIGH (ref 4.0–10.5)
nRBC: 2 % — ABNORMAL HIGH (ref 0.0–0.2)

## 2019-10-22 LAB — MAGNESIUM: Magnesium: 2.4 mg/dL (ref 1.7–2.4)

## 2019-10-22 LAB — APTT: aPTT: 59 seconds — ABNORMAL HIGH (ref 24–36)

## 2019-10-22 LAB — CREATININE, URINE, RANDOM: Creatinine, Urine: 61.77 mg/dL

## 2019-10-22 LAB — HEPARIN LEVEL (UNFRACTIONATED): Heparin Unfractionated: <0.1 IU/mL — ABNORMAL LOW (ref 0.30–0.70)

## 2019-10-22 LAB — ACETAMINOPHEN LEVEL: Acetaminophen (Tylenol), Serum: 14 ug/mL (ref 10–30)

## 2019-10-22 LAB — TRIGLYCERIDES: Triglycerides: 296 mg/dL — ABNORMAL HIGH (ref <150)

## 2019-10-22 MED ORDER — AMIODARONE HCL IN DEXTROSE 360-4.14 MG/200ML-% IV SOLN
60.0000 mg/h | INTRAVENOUS | Status: AC
  Administered 2019-10-22 (×2): 60 mg/h via INTRAVENOUS
  Filled 2019-10-22: qty 200

## 2019-10-22 MED ORDER — SODIUM BICARBONATE 8.4 % IV SOLN
INTRAVENOUS | Status: AC
  Filled 2019-10-22: qty 50

## 2019-10-22 MED ORDER — PHENYLEPHRINE CONCENTRATED 100MG/250ML (0.4 MG/ML) INFUSION SIMPLE
0.0000 ug/min | INTRAVENOUS | Status: DC
  Administered 2019-10-22: 20 ug/min via INTRAVENOUS
  Filled 2019-10-22 (×2): qty 250

## 2019-10-22 MED ORDER — STERILE WATER FOR INJECTION IV SOLN
INTRAVENOUS | Status: DC
  Filled 2019-10-22 (×10): qty 150

## 2019-10-22 MED ORDER — MIDAZOLAM 50MG/50ML (1MG/ML) PREMIX INFUSION
0.5000 mg/h | INTRAVENOUS | Status: DC
  Administered 2019-10-22: 0.5 mg/h via INTRAVENOUS
  Filled 2019-10-22: qty 50

## 2019-10-22 MED ORDER — SODIUM BICARBONATE 8.4 % IV SOLN
50.0000 meq | Freq: Once | INTRAVENOUS | Status: AC
  Administered 2019-10-22: 50 meq via INTRAVENOUS

## 2019-10-22 MED ORDER — AMIODARONE HCL IN DEXTROSE 360-4.14 MG/200ML-% IV SOLN
INTRAVENOUS | Status: AC
  Filled 2019-10-22: qty 200

## 2019-10-22 MED ORDER — DOPAMINE-DEXTROSE 3.2-5 MG/ML-% IV SOLN
0.0000 ug/kg/min | INTRAVENOUS | Status: DC
  Administered 2019-10-22: 5 ug/kg/min via INTRAVENOUS
  Filled 2019-10-22: qty 250

## 2019-10-22 MED ORDER — DEXTROSE 50 % IV SOLN
INTRAVENOUS | Status: AC
  Filled 2019-10-22: qty 50

## 2019-10-22 MED ORDER — SODIUM BICARBONATE 8.4 % IV SOLN
50.0000 meq | Freq: Once | INTRAVENOUS | Status: AC
  Administered 2019-10-22: 50 meq via INTRAVENOUS
  Filled 2019-10-22: qty 50

## 2019-10-22 MED ORDER — SODIUM BICARBONATE-DEXTROSE 150-5 MEQ/L-% IV SOLN
150.0000 meq | INTRAVENOUS | Status: DC
  Administered 2019-10-22 (×2): 150 meq via INTRAVENOUS
  Filled 2019-10-22 (×4): qty 1000

## 2019-10-22 MED ORDER — SODIUM CHLORIDE 0.9 % IV SOLN
0.0000 ug/kg/min | INTRAVENOUS | Status: DC
  Administered 2019-10-22: 3 ug/kg/min via INTRAVENOUS
  Filled 2019-10-22 (×3): qty 20

## 2019-10-22 MED ORDER — MIDAZOLAM HCL 2 MG/2ML IJ SOLN
INTRAMUSCULAR | Status: AC
  Filled 2019-10-22: qty 2

## 2019-10-22 MED ORDER — DEXTROSE 50 % IV SOLN
12.5000 g | Freq: Once | INTRAVENOUS | Status: AC
  Administered 2019-10-22: 12.5 g via INTRAVENOUS

## 2019-10-22 MED ORDER — MIDAZOLAM HCL 2 MG/2ML IJ SOLN
2.0000 mg | Freq: Once | INTRAMUSCULAR | Status: AC
  Administered 2019-10-22: 2 mg via INTRAVENOUS

## 2019-10-22 MED ORDER — IPRATROPIUM-ALBUTEROL 0.5-2.5 (3) MG/3ML IN SOLN
3.0000 mL | Freq: Three times a day (TID) | RESPIRATORY_TRACT | Status: DC
  Administered 2019-10-22 (×2): 3 mL via RESPIRATORY_TRACT
  Filled 2019-10-22 (×3): qty 3

## 2019-10-22 MED ORDER — CISATRACURIUM BOLUS VIA INFUSION
0.1000 mg/kg | Freq: Once | INTRAVENOUS | Status: AC
  Administered 2019-10-22: 13.3 mg via INTRAVENOUS
  Filled 2019-10-22: qty 14

## 2019-10-22 MED ORDER — SODIUM BICARBONATE 8.4 % IV SOLN
100.0000 meq | Freq: Once | INTRAVENOUS | Status: AC
  Administered 2019-10-22: 100 meq via INTRAVENOUS

## 2019-10-22 MED ORDER — AMIODARONE LOAD VIA INFUSION
150.0000 mg | Freq: Once | INTRAVENOUS | Status: AC
  Administered 2019-10-22 (×2): 150 mg via INTRAVENOUS
  Filled 2019-10-22: qty 83.34

## 2019-10-22 MED ORDER — ARTIFICIAL TEARS OPHTHALMIC OINT
1.0000 "application " | TOPICAL_OINTMENT | Freq: Three times a day (TID) | OPHTHALMIC | Status: DC
  Administered 2019-10-22: 1 via OPHTHALMIC
  Filled 2019-10-22: qty 3.5

## 2019-10-22 MED ORDER — AMIODARONE HCL IN DEXTROSE 360-4.14 MG/200ML-% IV SOLN
30.0000 mg/h | INTRAVENOUS | Status: DC
  Administered 2019-10-22: 30 mg/h via INTRAVENOUS

## 2019-10-23 LAB — BPAM RBC
Blood Product Expiration Date: 202109042359
ISSUE DATE / TIME: 202108131645
Unit Type and Rh: 5100

## 2019-10-23 LAB — TYPE AND SCREEN
ABO/RH(D): O NEG
Antibody Screen: NEGATIVE
Unit division: 0

## 2019-10-23 LAB — PATHOLOGIST SMEAR REVIEW

## 2019-10-25 LAB — CULTURE, BLOOD (ROUTINE X 2)
Culture: NO GROWTH
Culture: NO GROWTH
Special Requests: ADEQUATE

## 2019-11-02 ENCOUNTER — Ambulatory Visit: Payer: Medicare Other

## 2019-11-08 NOTE — Accreditation Note (Signed)
Restraints not reported to CMS Pursuant to regulation 482.13 (G) (3) use of soft wrist restraints was logged on 11/18/2019.

## 2019-11-08 NOTE — Procedures (Signed)
Central Venous Catheter Insertion Procedure Note  [name] [name]  [id number]  22-Jun-1945  Date:11/10/19  Time:1:11 AM   Provider Performing:Dejion Grillo   Procedure: Insertion of Non-tunneled Central Venous Catheter(36556)with US guidance (94944)    Indication(s) Hemodialysis  Consent Risks of the procedure as well as the alternatives and risks of each were explained to the patient and/or caregiver.  Consent for the procedure was obtained and is signed in the bedside chart  Anesthesia Topical only with 1% lidocaine   Timeout Verified patient identification, verified procedure, site/side was marked, verified correct patient position, special equipment/implants available, medications/allergies/relevant history reviewed, required imaging and test results available.  Sterile Technique Maximal sterile technique including full sterile barrier drape, hand hygiene, sterile gown, sterile gloves, mask, hair covering, sterile ultrasound probe cover (if used).  Procedure Description Area of catheter insertion was cleaned with chlorhexidine and draped in sterile fashion.   With real-time ultrasound guidance a HD catheter was placed into the right internal jugular vein.  Nonpulsatile blood flow and easy flushing noted in all ports.  The catheter was sutured in place and sterile dressing applied.  Complications/Tolerance None; patient tolerated the procedure well. Chest X-ray is ordered to verify placement for internal jugular or subclavian cannulation.  Chest x-ray is not ordered for femoral cannulation.  EBL Minimal  Specimen(s) None

## 2019-11-08 NOTE — Consult Note (Signed)
Pt requiring more pressor support than yesterday.  Multisystem organ failure.  Has doppler flow to both feet.  Nothing really to add from vascular surgery standpoint.  Wean pressors if able.  Will sign off.  Call if questions.  Ruta Hinds, MD [facility] [place] Office: [phone]

## 2019-11-08 NOTE — Progress Notes (Signed)
Patient's heart rhythm changed to asystole on the monitor at 1905. Phong Manivong RN and Phebe Colla RN auscultated no heart sounds at 1906.

## 2019-11-08 NOTE — Progress Notes (Addendum)
NAME:  [name] [name], MRN:  [id number], DOB:  10/31/1945, LOS: 2 ADMISSION DATE:  10/12/2019, CONSULTATION DATE:  10/27/2019 REFERRING MD:  EDP, CHIEF COMPLAINT:  AMS, respiratory insufficiency   Brief History   74 y.o.M with PMH of CADs/p PCI/DES to LAD in 2015, AAA, a-fib on coumadin, HTN, OSA and COPD sees Dr. Halford Chessman, DM and hx DVT/PE who was found unresponsive in bed by his wife. EMS found patient bradycardic with HR in 40's. He was intubated for GCS of 3. Found to have AKI with hyperkalemia (high of 7.0), elevated Tylenol level (58) and transaminases (ALT/AST 1234/1550) and INR 2.0. Transaminases peaked at 1704/2416 and appears to have plateaued at 1700/2000, respectively. Has required multiple pressors, whose effectiveness appears to be related to his level of acidemia. CRRT initiated early AM 8/15.  Past Medical History  Past medical history of Arthritis, Asthma, BPH (benign prostatic hypertrophy), CAD (coronary artery disease), Complication of anesthesia, COPD (chronic obstructive pulmonary disease) ([place]), Diabetes mellitus without complication ([name] [name]), Diverticulosis, DVT (deep venous thrombosis) ([name]), Dyspnea on exertion, GERD (gastroesophageal reflux disease), History of gout, History of hiatal hernia, echocardiogram (2015), Hypertension, Macrocytic anemia, Obesity, OSA (obstructive sleep apnea) (11/14/2015), PAF (paroxysmal atrial fibrillation) ([place]), Pneumonia (~ 2010 X 1), Pulmonary embolism ([place]), Sleep apnea, Spinal stenosis, Transaminitis, and 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  Consults:  Nephrology Vascular Surg  Procedures:  8/13 ETT 8/13 L IJ CVC 8/13 L radial A-line 8/15 R IJ HD catheter  Significant Diagnostic Tests:  8/13 CT head/abd/pelvis>>Multifocal tree-in-bud and patchy airspace opacities seen throughout the right lung 8/14: Bubble ECHO: Left Ventricle: Peak LVOT gradient 48 mmHg. Left ventricular ejection fraction, by estimation, is 60 to 65%. The left  ventricle has normal function. The left ventricle has no regional wall motion abnormalities. Left ventricular diastolic parameters were  normal. Right ventricular systolic function is normal.  8/14 Renal U/S: No hydronephrosis. Large R renal cyst  Micro Data:  8/13 BCx2>>NG after 2 days 8/13 Respiratory culture>>Not evident in system 8/13 Sars-CoV-2>>neg 8/13 Urine culture - NG (final) Antimicrobials:  Cefepime 8/13>    Interim history/subjective:  Sedated  Objective   Blood pressure 117/79, pulse (!) 134, temperature (!) 96.1 F (35.6 C), resp. rate (!) 23, height '6\' 2"'  (1.88 m), weight 133 kg, SpO2 95 %.    Vent Mode: PRVC FiO2 (%):  [40 %-60 %] 40 % Set Rate:  [26 bmp] 26 bmp Vt Set:  [650 mL] 650 mL PEEP:  [5 cmH20] 5 cmH20 Plateau Pressure:  [10 cmH20-19 cmH20] 14 cmH20   Intake/Output Summary (Last 24 hours) at 10-31-2019 1209 Last data filed at 2019-10-31 1100 Gross per 24 hour  Intake 5265.39 ml  Output 1797 ml  Net 3468.39 ml   Filed Weights   10/28/2019 1818 2019-10-31 0442  Weight: 123.4 kg 133 kg    Examination: General: WD, overwt WN sedated on vent HENT: [place]/AT. Pupils small and sluggishly reactive ETT/OG in place Lungs: Clear bilaterally, anteriorly and laterally Cardiovascular: Difficult to auscultate Abdomen: Soft w/o guarding Extremities: Pink toes. Pulses present with U/S Neuro: Currently sedated. RN reports followed commands earlier in day GU: Foley  Assessment & Plan:  1) Encephalopathy and respiratory insufficiency likely secondary to septic shock and multi-organ failurewith elevated Tylenol level -Acetaminophen level down and off Mucomyst -Was more alert per RN but transaminases have plateaued and INR remains elevated -Remains acidemic but has been on CRRT for <12 hrs -Became dyssynchonous and dropped his sats; will change sedation to versed drip and  paralyze with Nimbex -titrate Vent setting to maintain SpO2 greater than or equal to 90%. -HOB  elevated 30 degrees. -Plateau pressures less than 30 cm H20.  -Follow chest x-ray, ABGprn.  -Bronchial hygiene and RT/bronchodilator protocol. 2) Acute non-oliguric kidney injury and hyperkalemia  -On CRRT and Nephrology following -Will d/c Lokelma as on RRT 3) AG Met. Acidosis -Requiring significant amounts of NaHCO3 -Likely secondary to renal failure and lactic acidosis -Continue NaHCO3 drip and trend lactate levels 4) CAD, HTN, Atrial fibrillation -ECG and ECHO not suggestive of ACS or vegetations. Eelvated troponins likely related to renal faliure -Still on hi-dose pressors but with titrate to maintain MAP at least 65 5) Hx COPD and OSA -Continue BDs 6) Type II DM -Cont. SSI  Best practice:  Diet: NPO Pain/Anxiety/Delirium protocol (if indicated): Versed drip VAP protocol (if indicated): Tes DVT prophylaxis: SCDs GI prophylaxis: PPI Glucose control: SSI Mobility: BR Code Status: Full Family Communication: Spoke with daughter at bedside Disposition: ICU  Labs   CBC: Recent Labs  Lab 10/28/2019 1628 10/21/2019 1951 10/23/2019 2029 10/19/2019 2256 10/21/19 0544 10/21/19 1007 10/21/19 1533 11/07/2019 0420 11/07/19 0626  WBC 23.9*  --  27.8*  --  20.7*  --   --  14.7*  --   NEUTROABS 16.0*  --   --   --   --   --   --   --   --   HGB 10.3*   < > 10.7*   < > 10.3* 10.9* 10.2* 9.8* 9.9*  HCT 38.4*   < > 36.1*   < > 33.4* 32.0* 30.0* 31.2* 29.0*  MCV 134.3*  --  116.8*  --  110.2*  --   --  108.7*  --   PLT 623*  --  451*  --  383  --   --  288  --    < > = values in this interval not displayed.    Basic Metabolic Panel: Recent Labs  Lab 10/19/2019 2029 10/09/2019 2256 10/21/19 0113 10/21/19 0352 10/21/19 0544 10/21/19 0600 10/21/19 0600 10/21/19 1007 10/21/19 1533 10/21/19 2238 November 07, 2019 0420 07-Nov-2019 0626  NA 139   < > 139   < >  --  140   < > 138 139 143 143 138  K 7.0*   < > 5.9*   < >  --  7.2*   < > 6.8* 6.1* 5.8* 5.8* 5.3*  CL 106  --  107  --   --  104  --    --   --  100 99  --   CO2 16*  --  13*  --   --  14*  --   --   --  13* 13*  --   GLUCOSE 175*  --  141*  --   --  146*  --   --   --  143* 108*  --   BUN 22  --  28*  --   --  34*  --   --   --  48* 42*  --   CREATININE 3.21*  --  3.42*  --   --  4.24*  --   --   --  5.85* 4.96*  --   CALCIUM 7.5*  --  6.7*  --   --  6.5*  --   --   --  6.8* 6.7*  --   MG  --   --   --   --  2.6*  --   --   --   --   --  2.4  --   PHOS  --   --   --   --  8.0*  --   --   --   --   --  7.0*  --    < > = values in this interval not displayed.   GFR: Estimated Creatinine Clearance: 18.7 mL/min (A) (by C-G formula based on SCr of 4.96 mg/dL (H)). Recent Labs  Lab 10/28/2019 1628 10/28/2019 1830 10/10/2019 2029 10/21/19 0544 10/21/19 1800 10/21/19 2238 2019-10-28 0420  WBC 23.9*  --  27.8* 20.7*  --   --  14.7*  LATICACIDVEN  --  >11.0* 9.4*  --  7.3* 8.0*  --     Liver Function Tests: Recent Labs  Lab 10/28/2019 1628 10/17/2019 2029 10/21/19 0600 10/21/19 1800 10-28-2019 0420  AST 931* 1,550* 2,416* 1,979* 2,011*  ALT 971* 1,234* 1,704* 1,630* 1,706*  ALKPHOS 107 100 66 63 65  BILITOT 0.7 0.8 0.9 1.1 1.3*  PROT 7.1 5.8* 5.1* 5.3* 5.2*  ALBUMIN 3.6 2.8* 2.6* 2.7* 2.5*  2.5*   No results for input(s): LIPASE, AMYLASE in the last 168 hours. Recent Labs  Lab 11/02/2019 2029 10/21/19 0500  AMMONIA 148* 71*    ABG    Component Value Date/Time   PHART 7.198 (LL) 2019-10-28 0626   PCO2ART 36.5 Oct 28, 2019 0626   PO2ART 76 (L) 10/28/19 0626   HCO3 14.5 (L) 2019/10/28 0626   TCO2 16 (L) Oct 28, 2019 0626   ACIDBASEDEF 13.0 (H) 2019-10-28 0626   O2SAT 93.0 Oct 28, 2019 0626     Coagulation Profile: Recent Labs  Lab 11/07/2019 2029 10/21/19 0544 10/21/19 1800 10/21/19 2238 28-Oct-2019 0431  INR 2.0* 2.4* 2.7* 2.8* 3.0*    Cardiac Enzymes: Recent Labs  Lab 10/21/19 1800  CKTOTAL 5,399*    HbA1C: Hgb A1c MFr Bld  Date/Time Value Ref Range Status  10/02/2019 02:36 PM 5.7 (H) <5.7 % of total  Hgb Final    Comment:    For someone without known diabetes, a hemoglobin  A1c value between 5.7% and 6.4% is consistent with prediabetes and should be confirmed with a  follow-up test. . For someone with known diabetes, a value <7% indicates that their diabetes is well controlled. A1c targets should be individualized based on duration of diabetes, age, comorbid conditions, and other considerations. . This assay result is consistent with an increased risk of diabetes. . Currently, no consensus exists regarding use of hemoglobin A1c for diagnosis of diabetes for children. [name]   03/22/2019 02:16 PM 6.1 4.6 - 6.5 % Final    Comment:    Glycemic Control Guidelines for People with Diabetes:Non Diabetic:  <6%Goal of Therapy: <7%Additional Action Suggested:  >8%     CBG: Recent Labs  Lab 10/21/19 1551 10/21/19 2235 10-28-19 0021 10-28-19 0325 October 28, 2019 1157  GLUCAP 117* 111* 100* 88 1*    Past Medical History  He,  has a past medical history of Arthritis, Asthma, BPH (benign prostatic hypertrophy), CAD (coronary artery disease), Complication of anesthesia, COPD (chronic obstructive pulmonary disease) ([place]), Diabetes mellitus without complication ([place]), Diverticulosis, DVT (deep venous thrombosis) ([place]), Dyspnea on exertion, GERD (gastroesophageal reflux disease), History of gout, History of hiatal hernia, echocardiogram (2015), Hypertension, Macrocytic anemia, Obesity, OSA (obstructive sleep apnea) (11/14/2015), PAF (paroxysmal atrial fibrillation) ([place]), Pneumonia (~ 2010 X 1), Pulmonary embolism ([place]), Sleep apnea, Spinal stenosis, Transaminitis, and Ulcerative proctitis ([place]) (08/25/2011).   Surgical History    Past Surgical History:  Procedure Laterality Date  . ANTERIOR LUMBAR DISC ARTHROPLASTY  03/2008   "spacer poped out; had to repair"  . CARDIOVERSION N/A 04/02/2019   Procedure: CARDIOVERSION;  Surgeon: Deboraha Sprang, MD;  Location: [facility] OR;  Service: Cardiovascular;   Laterality: N/A;  . CATARACT EXTRACTION W/ INTRAOCULAR LENS  IMPLANT, BILATERAL Bilateral   . COLONOSCOPY    . COLONOSCOPY W/ POLYPECTOMY  2004  . Colonoscopy with polypectomy  09/2011   2 tubular adenomas  . CORONARY ANGIOPLASTY WITH STENT PLACEMENT  08/08/2013   "1"  . JOINT REPLACEMENT    . LEFT AND RIGHT HEART CATHETERIZATION WITH CORONARY ANGIOGRAM N/A 08/07/2013   Procedure: LEFT AND RIGHT HEART CATHETERIZATION WITH CORONARY ANGIOGRAM;  Surgeon: Peter M Martinique, MD;  Location: [facility] CATH LAB;  Service: Cardiovascular;  Laterality: N/A;  . LUMBAR FUSION  02/2008  . PERCUTANEOUS CORONARY STENT INTERVENTION (PCI-S)  08/07/2013   Procedure: PERCUTANEOUS CORONARY STENT INTERVENTION (PCI-S);  Surgeon: Peter M Martinique, MD;  Location: [facility] CATH LAB;  Service: Cardiovascular;;  . PVI ablation  11/2014   Dr. Venita Sheffield [facility]  . RIGHT/LEFT HEART CATH AND CORONARY ANGIOGRAPHY N/A 08/15/2018   Procedure: RIGHT/LEFT HEART CATH AND CORONARY ANGIOGRAPHY;  Surgeon: Nelva Bush, MD;  Location: [facility] CV LAB;  Service: Cardiovascular;  Laterality: N/A;  . TOTAL HIP ARTHROPLASTY Bilateral   . VASECTOMY       Social History   reports that he quit smoking about 19 years ago. His smoking use included cigarettes. He has a 80.00 pack-year smoking history. He has never used smokeless tobacco. He reports current alcohol use of about 2.0 - 3.0 standard drinks of alcohol per week. He reports that he does not use drugs.   Family History   His family history includes Alcohol abuse (age of onset: 73) in his brother; Atrial fibrillation in his mother; COPD in his mother; Colon cancer in his father; Heart attack in his paternal grandfather and paternal uncle; Heart attack (age of onset: 57) in his brother; Heart disease in his brother and father. There is no history of Stroke, Rectal cancer, Stomach cancer, or Esophageal cancer.   Allergies Allergies  Allergen Reactions  . Penicillins Anaphylaxis    Did  it involve swelling of the face/tongue/throat, SOB, or low BP? Yes Did it involve sudden or severe rash/hives, skin peeling, or any reaction on the inside of your mouth or nose? Yes Did you need to seek medical attention at a hospital or doctor's office? Yes When did it last happen?30 + years If all above answers are "NO", may proceed with cephalosporin use.      Home Medications  Prior to Admission medications   Medication Sig Start Date End Date Taking? Authorizing Provider  albuterol (VENTOLIN HFA) 108 (90 Base) MCG/ACT inhaler Inhale 2 puffs into the lungs every 6 (six) hours as needed for wheezing or shortness of breath.   Yes [provider]  allopurinol (ZYLOPRIM) 100 MG tablet Take 1 tablet (100 mg total) by mouth daily. Patient taking differently: Take 100 mg by mouth daily as needed (Gout).  06/16/19  Yes Burns, Claudina Lick, MD  aspirin 81 MG chewable tablet Chew 81 mg by mouth daily.  11/18/14  Yes [provider]  atorvastatin (LIPITOR) 10 MG tablet TAKE 1 TABLET EVERY EVENING Patient taking differently: Take 10 mg by mouth at bedtime.  05/23/19  Yes Minus Breeding, MD  Cholecalciferol (VITAMIN D-3) 25 MCG (1000 UT) CAPS Take 2,000 Units by mouth daily.    Yes [provider]  eszopiclone (  LUNESTA) 2 MG TABS tablet Take 2 mg by mouth at bedtime as needed for sleep.  10/11/19  Yes [provider]  Fluticasone-Umeclidin-Vilant (TRELEGY ELLIPTA) 100-62.5-25 MCG/INH AEPB Inhale 1 puff into the lungs daily.   Yes [provider]  LORazepam (ATIVAN) 0.5 MG tablet Take 1 tablet (0.5 mg total) by mouth at bedtime as needed for anxiety or sleep. 10/06/19  Yes Burns, Claudina Lick, MD  metFORMIN (GLUCOPHAGE) 500 MG tablet TAKE 1 TABLET TWICE A DAY WITH MEALS Patient taking differently: Take 500 mg by mouth 2 (two) times daily with a meal.  09/18/19  Yes Burns, Claudina Lick, MD  metoprolol tartrate (LOPRESSOR) 25 MG tablet Take 1 tablet (25 mg total) by mouth 2  (two) times daily. 04/14/19  Yes Minus Breeding, MD  nitroGLYCERIN (NITROSTAT) 0.4 MG SL tablet Place 1 tablet (0.4 mg total) under the tongue every 5 (five) minutes as needed for chest pain (up to 3 doses). 01/09/15  Yes Minus Breeding, MD  oxyCODONE-acetaminophen (PERCOCET) 10-325 MG tablet Take 1 tablet by mouth 3 (three) times daily as needed. 10/03/19  Yes [provider]  pantoprazole (PROTONIX) 40 MG tablet TAKE 1 TABLET DAILY Patient taking differently: Take 40 mg by mouth daily as needed (Heartburn).  02/03/19  Yes Minus Breeding, MD  venlafaxine XR (EFFEXOR-XR) 75 MG 24 hr capsule TAKE 1 CAPSULE DAILY WITH BREAKFAST Patient taking differently: Take 75 mg by mouth daily with breakfast.  09/08/19  Yes Burns, Claudina Lick, MD  verapamil (CALAN-SR) 120 MG CR tablet Take 1 tablet (120 mg total) by mouth at bedtime. 04/14/19  Yes Minus Breeding, MD  vitamin B-12 (CYANOCOBALAMIN) 1000 MCG tablet Take 1,000 mcg by mouth daily.   Yes [provider]  warfarin (COUMADIN) 3 MG tablet Take 1-2 tablets (3-6 mg total) by mouth See admin instructions. 68m everyday except on Friday take 385m1/24/21  Yes Weaver, ScNicki Reaper, PA-C  Cholecalciferol (VITAMIN D3 PO) Take 1 capsule by mouth daily. Patient not taking: Reported on 10/18/2019    [provider]  metoprolol tartrate (LOPRESSOR) 50 MG tablet Take 2 tablets by mouth 2 (two) times daily. Patient not taking: Reported on 10/23/2019 10/10/18   [provider]     Critical care time: 45 min    Addendum:  In the afternoon patient became progressively hypotensive, acidemic and hypoxemic. Had transient improvement in BP with NaHCO3 pushes, but remained hypotensive on triple pressors. After discussion with family as to poor likelihood of recovery if the patient remained hypotensive despite aggressive therapy, they agreed to make the patient a DNR.

## 2019-11-08 NOTE — Progress Notes (Signed)
[place] Progress Note Patient Name: TAG WURTZ DOB: [birth year] MRN: [id number]   Date of Service  10/28/2019  HPI/Events of Note  Hypotension to MAP ~60-65 (on levophed @ 40 mcg/min + Vaso), AFib with RVR to 150s, acidemic now on CRRT. Last ABG was 18 hours ago but pH 7.12 at that time.    eICU Interventions  Amio bolus and drip for Afib w/ RVR with accompanying hypotension requiring pressors.  ABG rechecked -- 7.16/39/95/15/BE -14. Will increase peripheral bicarb infusion from 75 cc/hr to 150 cc/hr. Also receiving post-filter bicarb via CRRT circuit.  Ordered Neo for additional hemodynamic support since already on substantial doses of Levophed + Vaso.     Intervention Category Major Interventions: Acid-Base disturbance - evaluation and management;Arrhythmia - evaluation and management  Charlott Rakes 2019-10-28, 6:17 AM

## 2019-11-08 NOTE — Progress Notes (Signed)
CDS notified of pt w/ GCS 3. Referral [id number].

## 2019-11-08 NOTE — Discharge Summary (Signed)
NAME: [name] [name], MRN: [id number], DOB: [birth year], ADMISSION DATE: 2019-11-06, DATE OF DEATH: 11/08/19  Brief History: [name] is a 42 y.y. M with PMH of CAD, es/p PCI/DES to LAD in 2015; AAA, a-fib on coumadin, HTN, OSA and COPD, DM, and hx DVT/PE, who was found unresponsive by his wife. EMS found the patient bradycardic with HR in the 40's. He was intubated for GCS 3 in the ED. Upon presentation, he was found to have AKI with peak K+ 7.0 with acidemia (pH 7.028), elevated acetaminophen level (58), transaminases (ALT/AST 1234/1550, respectively) and INR 2.0. He was taking Tylenol for chronic back pain. Transaminases peaked at 1704/2416 and appeared to have plateaued at 1700/2000, respectively.  Hospital Course: Work up in the ED included a had CT - negative; Chest/abd/pelvis CT: multifocal tree-in-bud and patchy airspace opacities throughout the right lung, diverticulosis w/o diverticulitis and no renal pathology. Initial BPs were 60s/40s for which Levophed was initiated, along with NAC for the elevated acetaminophen level and NaHCO3 and Lokelma for the acidosis and hyper K+, respectively. He was admitted to the ICU where a LIJ central venous catheter was place for meds administration and a L radial art line placed for anticipated frequent blood draws. An antibiotic regimen of vancomycin and Cefipime was also initiated for possible sepsis/PNA. Following discussion with poison control, the recommendation was to give a dose of fomepizole along with Mucimyst for at least 24 hours and until the acetaminophen level is no longer elevated and LFTs are down-trending. Over the day of 8/14, the NH3 level and transaminases declined compared with admission but the acidemia and hyperK+ persisted despite additional Lokelma and a NaHCO3 drip. Nephrology was consulted and CRRT iniitated on the early AM of 11-08-2022 through a RIJ dialysis catheter. Hypotension subsequently ensued, requiring additional vasopressor  support. The patient became more responsive later that AM despite an apparent plateauing of the transaminases and persistently elevated INR and acidemia. However, later in the afternoon the patient became dyssynchronous on the ventilator with hypoxemia, for which he was further sedated and paralyzed. Moreover, episodes of hypotension became more frequent, which appeared in part to be related to the acidemia, as the BP/MAP improved with additional NaHCO3 boluses. As the acidemia persisted despite CRRT and NaHCO3 boluses became progressively ineffective to augment the MAP while on triple pressors, the family in discussion with the medical staff agreed to provide comfort measures. As such, with the patient hypotensive of triple pressors, they were discontinued, and the patient subsequently expired at 19:06 on November 08, 2019.

## 2019-11-08 NOTE — Progress Notes (Signed)
[facility] Progress Note  Subjective: cath placed 1 am and has been on for 6 hours  Vitals:   Nov 07, 2019 0400 2019-11-07 0438 11-07-2019 0442 07-Nov-2019 0500  BP: 99/72   124/73  Pulse: 88   93  Resp: (!) 24   (!) 22  Temp: (!) 97.2 F (36.2 C)   (!) 96.4 F (35.8 C)  TempSrc: Bladder     SpO2: 99% 98%  100%  Weight:   133 kg   Height:        Exam: Gen on vent, sedated, opens eyes and moves arms w/ purposeful movement, not following commands No rash, cyanosis or gangrene Sclera anicteric, throat w ETT  No jvd or bruits Chest clear bilat no rales or wheezing RRR no MRG Abd soft ntnd no mass or ascites +bs GU normal male w/ foley draining small amts clear yellow urine MS no joint effusions or deformity Ext 1+ bilat UE edema, no other edema, toes are dusky on both foot and cool Neuro is sedated on vent    Home meds:  - asa 81/ lipitor 98m/ metoprolol 25 bid/ sl ntg prn/ verapamil 120 sr  - coumadin 3- 6106mper day  - effexor xr 75 qd/ ativan prn sleep  - allopurinol 100 qd prn/ protonix 40 qd  - metformin 500 bid ac  - Trelegy ellipta 1 puff qd  - prn's/ vitamins/ supplements   UA 8/13 - cloudy, 0-5 rbc/ wbc, neg protein  UNa 61 , UCr 67   Renal [place]2.9/ 11 com kidneys w/o hydro, normal echo   CXR 8/13 -  IMPRESSION: 1. Endotracheal tube above the carina. 2. Faint bilateral mid to lower lung field subpleural densities may represent atelectasis or atypical infection.    CT abd noncon 8/13 > Adrenals/Urinary Tract: Both adrenal glands appear normal. There is a 3.9 cm low-density lesion seen in the lower pole the right kidney. Bladder is unremarkable. (kidneys a bit small to the eye).   Lungs:  Multifocal tree-in-bud and patchy airspace opacities seen throughout the right lung which could be due to infectious or inflammatory process. Baseline creat from 2009 - October 02, 2019 is 0.9- 1.1.     Assessment/ Plan: 1. AKI - acute ischemic ATN due to shock /  hypoperfusion.  Oliguric. B/l creat 0.9 from 09/2019. Complicated by refractory metabolic acidosis and hyperkalemia, ^B/Cr.  Cont CRRT w/ bicarb pre and post. No heparin w/ ^IEverlean AlstromSee orders.   2. Volume - min edema, no sig vol excess on CXR, keep even, check CVP's 3. Shock - suspected PNA, aspiration vs other, on pressors and IV abx 4. Tylenol toxicity - sp mucomyst 5. ^LFT's / INR- improving 6. VDRF - per CCM 7. AMS- suspected d/t sepsis/ PNA 8. Hx CAD/ HTN/ atrial fib - ^^trop, echo pending, holding coumadin w/ ^IJeannie Done. DM2 10. Hx COPD     Rob Demarrio Menges 8/August 31, 20216:30 AM   Recent Labs  Lab 10/21/19 0544 10/21/19 0600 10/21/19 1533 10/21/19 2238 08August 31, [id number]  K  --    < > 6.1* 5.8* 5.8*  BUN  --    < >  --  48* 42*  CREATININE  --    < >  --  5.85* 4.96*  CALCIUM  --    < >  --  6.8* 6.7*  PHOS 8.0*  --   --   --  7.0*  HGB 10.3*   < > 10.2*  --  9.8*   < > =  values in this interval not displayed.   Inpatient medications: . chlorhexidine gluconate (MEDLINE KIT)  15 mL Mouth Rinse BID  . Chlorhexidine Gluconate Cloth  6 each Topical Daily  . docusate  100 mg Oral BID  . insulin aspart  0-15 Units Subcutaneous Q4H  . insulin aspart  5 Units Intravenous Once  . ipratropium-albuterol  3 mL Nebulization TID  . mouth rinse  15 mL Mouth Rinse 10 times per day  . pantoprazole (PROTONIX) IV  40 mg Intravenous Daily  . polyethylene glycol  17 g Oral Daily  . sodium zirconium cyclosilicate  10 g Per Tube BID   .  prismasol BGK 4/2.5 400 mL/hr at 10/21/19 2358  . sodium chloride    . sodium chloride    . sodium chloride Stopped (11-06-2019 0006)  . acetylcysteine 15 mg/kg/hr (11-06-2019 0500)  . amiodarone     Followed by  . amiodarone    . ceFEPime (MAXIPIME) IV Stopped (10/21/19 2310)  . dexmedetomidine (PRECEDEX) IV infusion 1 mcg/kg/hr (2019-11-06 0500)  . fentaNYL infusion INTRAVENOUS 50 mcg/hr (06-Nov-2019 0500)  . norepinephrine 38 mcg/min (11-06-2019 0500)  . phenylephrine  (NEO-SYNEPHRINE) Adult infusion    . prismasol BGK 4/2.5 2,000 mL/hr at 11-06-2019 0515  . sodium bicarbonate 150 mEq in dextrose 5% 1000 mL 75 mL/hr at Nov 06, 2019 0500  . sodium bicarbonate (isotonic) 1000 mL infusion 200 mL/hr at 11-06-19 0408  . vasopressin 0.03 Units/min (11/06/19 0500)   Place/Maintain arterial line **AND** sodium chloride, sodium chloride, albuterol, alteplase, docusate sodium, fentaNYL, fentaNYL (SUBLIMAZE) injection, heparin, polyethylene glycol, sodium chloride

## 2019-11-08 NOTE — Progress Notes (Signed)
[name] [name] for Heparin Indication: chest pain/ACS  Allergies  Allergen Reactions  . Penicillins Anaphylaxis    Did it involve swelling of the face/tongue/throat, SOB, or low BP? Yes Did it involve sudden or severe rash/hives, skin peeling, or any reaction on the inside of your mouth or nose? Yes Did you need to seek medical attention at a hospital or doctor's office? Yes When did it last happen?30 + years If all above answers are "NO", may proceed with cephalosporin use.     Patient Measurements: Height: 6\' 2"  (188 cm) Weight: 133 kg (293 lb 3.4 oz) IBW/kg (Calculated) : 82.2 Heparin Dosing Weight: 110 kg  Vital Signs: Temp: 95.7 F (35.4 C) (08/15 0900) Temp Source: Bladder (08/15 0800) BP: 128/73 (08/15 0900) Pulse Rate: 123 (08/15 0800)  Labs: Recent Labs    11/06/2019 1628 10/21/2019 1951 11/02/2019 2029 10/31/2019 2029 10/28/2019 2256 10/21/19 0113 10/21/19 0400 10/21/19 0544 10/21/19 0544 10/21/19 0600 10/21/19 1007 10/21/19 1533 10/21/19 1533 10/21/19 1800 10/21/19 2238 Nov 16, 2019 0420 2019/11/16 0431 Nov 16, 2019 0626  HGB 10.3*   < > 10.7*   < >   < >  --   --  10.3*  --   --    < > 10.2*   < >  --   --  9.8*  --  9.9*  HCT 38.4*   < > 36.1*   < >   < >  --   --  33.4*  --   --    < > 30.0*  --   --   --  31.2*  --  29.0*  PLT 623*   < > 451*  --   --   --   --  383  --   --   --   --   --   --   --  288  --   --   APTT 56*  --   --   --   --   --   --   --   --   --   --   --   --   --   --  59*  --   --   LABPROT 24.6*   < > 21.7*   < >  --   --   --  25.0*   < >  --   --   --   --  27.7* 28.5*  --  29.9*  --   INR 2.3*   < > 2.0*   < >  --   --   --  2.4*   < >  --   --   --   --  2.7* 2.8*  --  3.0*  --   HEPARINUNFRC  --   --   --   --   --   --   --   --   --   --   --   --   --   --   --   --  <0.10*  --   CREATININE 3.31*   < > 3.21*  --    < >   < >  --   --   --  4.24*  --   --   --   --  5.85* 4.96*  --   --    CKTOTAL  --   --   --   --   --   --   --   --   --   --   --   --   --  37,399*  --   --   --   --   TROPONINIHS  --   --   --   --    < >  --  2,681* 3,303*  --  3,300*  --   --   --   --   --   --   --   --    < > = values in this interval not displayed.    Estimated Creatinine Clearance: 18.7 mL/min (A) (by C-G formula based on SCr of 4.96 mg/dL (H)).   Medical History: Past Medical History:  Diagnosis Date  . Arthritis   . Asthma   . BPH (benign prostatic hypertrophy)   . CAD (coronary artery disease)    a. 07/2013: s/p DES to LAD, normal LVF.  [name] Complication of anesthesia    "I got all kinds of hallucinations"  . COPD (chronic obstructive pulmonary disease) ([place])    a. 07/2013 PFT's mild airflow obstruction, no restriction, sev decrease in DLCO.  . Diabetes mellitus without complication ([place])   . Diverticulosis   . DVT (deep venous thrombosis) ([place])    a. 2010 Lower ext s/p back surgery.  [name] Dyspnea on exertion    a. 07/2013 PFT's mild airflow obstr   . GERD (gastroesophageal reflux disease)   . History of gout   . History of hiatal hernia   . Hx of echocardiogram 2015   Echo (06/2013): EF 60-65% normal wall motion, normal diastolic function, aortic sclerosis without stenosis, Trivial MR, mild SAM due to long, redundant mitral leaflets, mild RAE, normal RVSF  . Hypertension   . Macrocytic anemia    a. 07/2013: documented on prior labs.  . Obesity   . OSA (obstructive sleep apnea) 11/14/2015  . PAF (paroxysmal atrial fibrillation) ([place])    a. Flecainide discontinued 07/2013 in setting of CAD.; b. s/p PVI Ablation at [facility] (Dr Joseph Berkshire) 11/2014  . Pneumonia ~ 2010 X 1  . Pulmonary embolism ([place])    a. 2010 in setting of DVT post-op back surgery. b. Low prob VQ 07/2013.  [name] Sleep apnea    on cpap  . Spinal stenosis    Cong[place]  . Transaminitis    a. 07/2013: mild.  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 male admitted with AMS/sepsis, h/o Afib  on Coumadin at home. Last dose 8/8 (held for spinal injection PTA). Pharmacy consulted for heparin for possible ACS. Troponin peaked at 3300. No EKG changes.   INR up to 3.0 . LFTs elevated and stable (AST 2416>1979>2011, ALT 1704>1630>1706). H/H, plt stable. No s/sx of bleeding. Patient very tenuous on 3 pressors, CRRT, and LFTs at plateau. Discussed heparin plan with MD who said will depend on how patient is doing when INR is less than 2.   Goal of Therapy:  Heparin level 0.3-0.7 units/ml Monitor platelets by anticoagulation protocol: Yes   Plan:  F/u INR; when less than 2, f/u with MD for heparin plan    Benetta Spar, PharmD, BCPS, [place] Clinical Pharmacist  Please check AMION for all [place] phone numbers After 10:00 PM, call [place] [phone]

## 2019-11-08 NOTE — Progress Notes (Signed)
Wasted 27 of versed & 100 cc of Fentanyl in stericycle with Tillie Rung, RN

## 2019-11-08 DEATH — deceased

## 2020-04-03 ENCOUNTER — Ambulatory Visit: Payer: Medicare Other | Admitting: Internal Medicine
